# Patient Record
Sex: Male | Born: 1970 | Race: White | Hispanic: No | Marital: Married | Smoking: Former smoker
Health system: Southern US, Community
[De-identification: ages and names within clinical notes are randomized; demographics above are authoritative.]

## PROBLEM LIST (undated history)

## (undated) DIAGNOSIS — R519 Headache, unspecified: Secondary | ICD-10-CM

## (undated) DIAGNOSIS — R0600 Dyspnea, unspecified: Secondary | ICD-10-CM

## (undated) DIAGNOSIS — M549 Dorsalgia, unspecified: Secondary | ICD-10-CM

## (undated) DIAGNOSIS — I4891 Unspecified atrial fibrillation: Secondary | ICD-10-CM

## (undated) DIAGNOSIS — I209 Angina pectoris, unspecified: Secondary | ICD-10-CM

## (undated) DIAGNOSIS — T7840XA Allergy, unspecified, initial encounter: Secondary | ICD-10-CM

## (undated) DIAGNOSIS — K219 Gastro-esophageal reflux disease without esophagitis: Secondary | ICD-10-CM

## (undated) DIAGNOSIS — I2510 Atherosclerotic heart disease of native coronary artery without angina pectoris: Secondary | ICD-10-CM

## (undated) DIAGNOSIS — I509 Heart failure, unspecified: Secondary | ICD-10-CM

## (undated) DIAGNOSIS — E785 Hyperlipidemia, unspecified: Secondary | ICD-10-CM

## (undated) DIAGNOSIS — J189 Pneumonia, unspecified organism: Secondary | ICD-10-CM

## (undated) DIAGNOSIS — I10 Essential (primary) hypertension: Secondary | ICD-10-CM

## (undated) DIAGNOSIS — J449 Chronic obstructive pulmonary disease, unspecified: Secondary | ICD-10-CM

## (undated) DIAGNOSIS — F419 Anxiety disorder, unspecified: Secondary | ICD-10-CM

## (undated) DIAGNOSIS — M1990 Unspecified osteoarthritis, unspecified site: Secondary | ICD-10-CM

## (undated) DIAGNOSIS — I499 Cardiac arrhythmia, unspecified: Secondary | ICD-10-CM

## (undated) DIAGNOSIS — Z87442 Personal history of urinary calculi: Secondary | ICD-10-CM

## (undated) DIAGNOSIS — G4730 Sleep apnea, unspecified: Secondary | ICD-10-CM

## (undated) DIAGNOSIS — J984 Other disorders of lung: Secondary | ICD-10-CM

## (undated) DIAGNOSIS — I219 Acute myocardial infarction, unspecified: Secondary | ICD-10-CM

## (undated) DIAGNOSIS — J45909 Unspecified asthma, uncomplicated: Secondary | ICD-10-CM

## (undated) HISTORY — DX: Hyperlipidemia, unspecified: E78.5

## (undated) HISTORY — DX: Essential (primary) hypertension: I10

## (undated) HISTORY — PX: TONSILLECTOMY: SUR1361

## (undated) HISTORY — DX: Allergy, unspecified, initial encounter: T78.40XA

## (undated) HISTORY — PX: OTHER SURGICAL HISTORY: SHX169

## (undated) HISTORY — DX: Chronic obstructive pulmonary disease, unspecified: J44.9

## (undated) HISTORY — DX: Dorsalgia, unspecified: M54.9

## (undated) HISTORY — DX: Anxiety disorder, unspecified: F41.9

---

## 1994-12-07 HISTORY — PX: BACK SURGERY: SHX140

## 1997-12-07 HISTORY — PX: LUMBAR DISC SURGERY: SHX700

## 2002-12-07 HISTORY — PX: KNEE ARTHROSCOPY: SUR90

## 2005-03-21 ENCOUNTER — Inpatient Hospital Stay: Payer: Self-pay | Admitting: Internal Medicine

## 2005-03-23 ENCOUNTER — Other Ambulatory Visit: Payer: Self-pay

## 2005-12-09 ENCOUNTER — Emergency Department: Payer: Self-pay | Admitting: Emergency Medicine

## 2006-04-09 ENCOUNTER — Emergency Department: Payer: Self-pay | Admitting: Emergency Medicine

## 2011-04-06 ENCOUNTER — Inpatient Hospital Stay: Payer: Self-pay | Admitting: Internal Medicine

## 2011-04-07 HISTORY — PX: CATHETERIZATION OF PULMONARY ARTERY WITH RETRIEVAL OF FOREIGN BODY: SHX6517

## 2011-05-29 ENCOUNTER — Ambulatory Visit: Payer: Self-pay | Admitting: Internal Medicine

## 2011-06-02 ENCOUNTER — Ambulatory Visit: Payer: Self-pay | Admitting: Gastroenterology

## 2011-06-04 LAB — PATHOLOGY REPORT

## 2011-06-17 ENCOUNTER — Ambulatory Visit: Payer: Self-pay

## 2014-11-10 ENCOUNTER — Emergency Department: Payer: Self-pay | Admitting: Student

## 2015-06-28 ENCOUNTER — Telehealth: Payer: Self-pay | Admitting: Family Medicine

## 2015-06-28 MED ORDER — ALPRAZOLAM 1 MG PO TABS: 1.0000 mg | ORAL_TABLET | Freq: Four times a day (QID) | ORAL | Status: DC | PRN

## 2015-06-28 NOTE — Telephone Encounter (Signed)
Medication has been refilled and patient will come and pick up prescription 30-day supply was sent. Patient will need to schedule an appointment before next refill is due.

## 2015-06-28 NOTE — Telephone Encounter (Signed)
PT IS NEEDING REFILL ON HIS  ALPRAZOLAM  4 TIMES A DAY. PT IS OUT. PLEASE NEED THIS TODAY. GOES ON [employer] THIS WEEKEND.

## 2015-07-09 ENCOUNTER — Ambulatory Visit: Payer: Self-pay | Admitting: Family Medicine

## 2015-08-06 ENCOUNTER — Ambulatory Visit (INDEPENDENT_AMBULATORY_CARE_PROVIDER_SITE_OTHER): Payer: PRIVATE HEALTH INSURANCE | Admitting: Family Medicine

## 2015-08-06 ENCOUNTER — Encounter (INDEPENDENT_AMBULATORY_CARE_PROVIDER_SITE_OTHER): Payer: Self-pay

## 2015-08-06 ENCOUNTER — Encounter: Payer: Self-pay | Admitting: Family Medicine

## 2015-08-06 VITALS — BP 112/78 | HR 87 | Temp 98.0°F | Resp 18 | Ht 72.0 in | Wt 232.5 lb

## 2015-08-06 DIAGNOSIS — J45909 Unspecified asthma, uncomplicated: Secondary | ICD-10-CM | POA: Insufficient documentation

## 2015-08-06 DIAGNOSIS — I2510 Atherosclerotic heart disease of native coronary artery without angina pectoris: Secondary | ICD-10-CM

## 2015-08-06 DIAGNOSIS — J449 Chronic obstructive pulmonary disease, unspecified: Secondary | ICD-10-CM | POA: Insufficient documentation

## 2015-08-06 DIAGNOSIS — F419 Anxiety disorder, unspecified: Secondary | ICD-10-CM | POA: Diagnosis not present

## 2015-08-06 DIAGNOSIS — G4733 Obstructive sleep apnea (adult) (pediatric): Secondary | ICD-10-CM | POA: Insufficient documentation

## 2015-08-06 DIAGNOSIS — E785 Hyperlipidemia, unspecified: Secondary | ICD-10-CM | POA: Diagnosis not present

## 2015-08-06 DIAGNOSIS — I25118 Atherosclerotic heart disease of native coronary artery with other forms of angina pectoris: Secondary | ICD-10-CM | POA: Insufficient documentation

## 2015-08-06 MED ORDER — ATORVASTATIN CALCIUM 40 MG PO TABS: 40.0000 mg | ORAL_TABLET | Freq: Every day | ORAL | Status: DC

## 2015-08-06 MED ORDER — CLOPIDOGREL BISULFATE 75 MG PO TABS: 75.0000 mg | ORAL_TABLET | Freq: Every day | ORAL | Status: DC

## 2015-08-06 MED ORDER — ALPRAZOLAM 1 MG PO TABS: 1.0000 mg | ORAL_TABLET | Freq: Four times a day (QID) | ORAL | Status: DC | PRN

## 2015-08-06 NOTE — Progress Notes (Signed)
Name: Cory Espinoza   MRN: [id number]    DOB: 09-Jul-1971   Date:08/06/2015       Progress Note  Subjective  Chief Complaint  Chief Complaint  Patient presents with  . Medication Refill    All meds  . Anxiety  . Hypertension  . Hyperlipidemia    Anxiety Presents for follow-up visit. Symptoms include muscle tension. Patient reports no chest pain, nervous/anxious behavior, palpitations, panic or shortness of breath.   Past treatments include benzodiazephines. The treatment provided significant relief.  Hyperlipidemia This is a chronic problem. The problem is controlled. Recent lipid tests were reviewed and are normal. Pertinent negatives include no chest pain, focal weakness, leg pain, myalgias or shortness of breath. Current antihyperlipidemic treatment includes statins. Risk factors for coronary artery disease include dyslipidemia, male sex and stress.  Coronary Artery Disease Pt. Has history of  [name] s/p MI in 2006. He is on Plavix 75mg  daily. Has not seen a cardiologist in over 1 year. Recent episode of waking up with chest tightness, relieved with nitroglycerin. Patient admits to being under increased stress at that time.  Past Medical History  Diagnosis Date  . Allergy   . Anxiety   . Hyperlipidemia   . Hypertension   . COPD (chronic obstructive pulmonary disease)     Past Surgical History  Procedure Laterality Date  . Catheterization of pulmonary artery with retrieval of foreign body Bilateral 04/07/2011    heart  . Knee arthroscopy Bilateral 2004  . Lumbar disc surgery  1999  . Back surgery  1996    Family History  Problem Relation Age of Onset  . Heart disease Mother   . Heart disease Father   . Diabetes Sister   . Hypertension Brother   . Cancer Paternal Grandmother     breast  . Diabetes Paternal Grandmother     Social History   Social History  . Marital Status: Married    Spouse Name: N/A  . Number of Children: N/A  . Years of Education: N/A    Occupational History  . Not on file.   Social History Main Topics  . Smoking status: Never Smoker   . Smokeless tobacco: Never Used  . Alcohol Use: 0.0 oz/week    0 Standard drinks or equivalent per week     Comment: rarely  . Drug Use: No  . Sexual Activity: Not on file   Other Topics Concern  . Not on file   Social History Narrative  . No narrative on file     Current outpatient prescriptions:  .  ALPRAZolam (XANAX) 1 MG tablet, Take 1 tablet (1 mg total) by mouth 4 (four) times daily as needed for anxiety., Disp: 120 tablet, Rfl: 0 .  atorvastatin (LIPITOR) 40 MG tablet, Take by mouth., Disp: , Rfl:  .  B COMPLEX VITAMINS PO, Take by mouth., Disp: , Rfl:  .  budesonide-formoterol (SYMBICORT) 160-4.5 MCG/ACT inhaler, Inhale into the lungs., Disp: , Rfl:  .  clopidogrel (PLAVIX) 75 MG tablet, , Disp: , Rfl: 0 .  nitroGLYCERIN (NITROLINGUAL) 0.4 MG/SPRAY spray, , Disp: , Rfl:  .  Saw Palmetto, Serenoa repens, (SAW PALMETTO BERRY) 160 MG CAPS, Take by mouth., Disp: , Rfl:   Allergies  Allergen Reactions  . Amoxicillin   . Aspirin     swelling of throat  . Codeine Hives  . Hydrocodone-Acetaminophen     itching  . Oxycodone-Acetaminophen Nausea And Vomiting  . Penicillins     unknown  Review of Systems  Respiratory: Negative for shortness of breath.   Cardiovascular: Negative for chest pain and palpitations.  Musculoskeletal: Negative for myalgias.  Neurological: Negative for focal weakness.  Psychiatric/Behavioral: Negative for depression. The patient is not nervous/anxious.     Objective  Filed Vitals:   08/06/15 0822  BP: 112/78  Pulse: 87  Temp: 98 F (36.7 C)  TempSrc: Oral  Resp: 18  Height: 6' (1.829 m)  Weight: 232 lb 8 oz (105.461 kg)  SpO2: 96%    Physical Exam  Constitutional: He is oriented to person, place, and time and well-developed, well-nourished, and in no distress.  HENT:  Head: Normocephalic and atraumatic.  Cardiovascular:  Normal rate and regular rhythm.   Pulmonary/Chest: Effort normal and breath sounds normal.  Abdominal: Soft. Bowel sounds are normal.  Neurological: He is alert and oriented to person, place, and time.  Skin: Skin is warm and dry.  Psychiatric: Mood, memory, affect and judgment normal.  Nursing note and vitals reviewed.  Assessment & Plan  1. Hyperlipidemia  - Lipid Profile - Comprehensive Metabolic Panel (CMET) - atorvastatin (LIPITOR) 40 MG tablet; Take 1 tablet (40 mg total) by mouth daily at 6 PM.  Dispense: 90 tablet; Refill: 1  2. Anxiety  - ALPRAZolam (XANAX) 1 MG tablet; Take 1 tablet (1 mg total) by mouth 4 (four) times daily as needed for anxiety.  Dispense: 120 tablet; Refill: 2  3. Atherosclerosis of native coronary artery of native heart without angina pectoris Patient advised to follow-up with cardiology given his recent episode of chest tightness relieved with nitroglycerin. Prescription for Plavix provided.  - atorvastatin (LIPITOR) 40 MG tablet; Take 1 tablet (40 mg total) by mouth daily at 6 PM.  Dispense: 90 tablet; Refill: 1 - clopidogrel (PLAVIX) 75 MG tablet; Take 1 tablet (75 mg total) by mouth daily.  Dispense: 90 tablet; Refill: 1   Shaquitta Burbridge Asad A[facility] 08/06/2015 8:38 AM

## 2015-08-07 LAB — COMPREHENSIVE METABOLIC PANEL
ALT: 43 IU/L (ref 0–44)
AST: 23 IU/L (ref 0–40)
Albumin/Globulin Ratio: 2 (ref 1.1–2.5)
Albumin: 4.3 g/dL (ref 3.5–5.5)
Alkaline Phosphatase: 78 IU/L (ref 39–117)
BUN/Creatinine Ratio: 12 (ref 9–20)
BUN: 9 mg/dL (ref 6–24)
Bilirubin Total: 0.5 mg/dL (ref 0.0–1.2)
CO2: 24 mmol/L (ref 18–29)
Calcium: 9.4 mg/dL (ref 8.7–10.2)
Chloride: 103 mmol/L (ref 97–108)
Creatinine, Ser: 0.77 mg/dL (ref 0.76–1.27)
GFR calc Af Amer: 128 mL/min/{1.73_m2} (ref >59)
GFR calc non Af Amer: 111 mL/min/{1.73_m2} (ref >59)
Globulin, Total: 2.2 g/dL (ref 1.5–4.5)
Glucose: 92 mg/dL (ref 65–99)
Potassium: 4.8 mmol/L (ref 3.5–5.2)
Sodium: 141 mmol/L (ref 134–144)
Total Protein: 6.5 g/dL (ref 6.0–8.5)

## 2015-08-07 LAB — LIPID PANEL
Chol/HDL Ratio: 2.6 ratio units (ref 0.0–5.0)
Cholesterol, Total: 115 mg/dL (ref 100–199)
HDL: 45 mg/dL (ref >39)
LDL Calculated: 57 mg/dL (ref 0–99)
Triglycerides: 63 mg/dL (ref 0–149)
VLDL Cholesterol Cal: 13 mg/dL (ref 5–40)

## 2015-10-29 ENCOUNTER — Encounter: Payer: Self-pay | Admitting: Family Medicine

## 2015-10-29 ENCOUNTER — Ambulatory Visit (INDEPENDENT_AMBULATORY_CARE_PROVIDER_SITE_OTHER): Payer: PRIVATE HEALTH INSURANCE | Admitting: Family Medicine

## 2015-10-29 VITALS — BP 122/76 | HR 78 | Temp 97.8°F | Resp 18 | Ht 72.0 in | Wt 228.2 lb

## 2015-10-29 DIAGNOSIS — I2510 Atherosclerotic heart disease of native coronary artery without angina pectoris: Secondary | ICD-10-CM | POA: Diagnosis not present

## 2015-10-29 DIAGNOSIS — R6882 Decreased libido: Secondary | ICD-10-CM | POA: Diagnosis not present

## 2015-10-29 DIAGNOSIS — F419 Anxiety disorder, unspecified: Secondary | ICD-10-CM | POA: Diagnosis not present

## 2015-10-29 DIAGNOSIS — R7401 Elevation of levels of liver transaminase levels: Secondary | ICD-10-CM

## 2015-10-29 DIAGNOSIS — R740 Nonspecific elevation of levels of transaminase and lactic acid dehydrogenase [LDH]: Secondary | ICD-10-CM | POA: Diagnosis not present

## 2015-10-29 DIAGNOSIS — E785 Hyperlipidemia, unspecified: Secondary | ICD-10-CM | POA: Diagnosis not present

## 2015-10-29 MED ORDER — ALPRAZOLAM 1 MG PO TABS: 1.0000 mg | ORAL_TABLET | Freq: Four times a day (QID) | ORAL | Status: DC | PRN

## 2015-10-29 NOTE — Progress Notes (Signed)
Name: Cory Espinoza   MRN: [id number]    DOB: 11-14-1971   Date:10/29/2015       Progress Note  Subjective  Chief Complaint  Chief Complaint  Patient presents with  . Medication Refill    3 month F/U  . COPD    Well controlled  . Hyperlipidemia    Takes potassium pills to help with the cramps and has improved symptoms since.  . Anxiety    Hyperlipidemia This is a chronic problem. The problem is controlled. Pertinent negatives include no chest pain, leg pain or myalgias. Current antihyperlipidemic treatment includes statins.  Anxiety Presents for follow-up visit. Symptoms include insomnia (sleep schedule is different every 2 weeks.). Patient reports no chest pain, excessive worry, malaise, nervous/anxious behavior, palpitations or panic.   Past treatments include benzodiazephines. The treatment provided significant relief. Compliance with prior treatments has been good.  Decrease in Libido Pt. Describes decreased sex drive, rarely interested in sexual activity. Has history of low Testosterone in the past.   Past Medical History  Diagnosis Date  . Allergy   . Anxiety   . Hyperlipidemia   . Hypertension   . COPD (chronic obstructive pulmonary disease) [facility])     Past Surgical History  Procedure Laterality Date  . Catheterization of pulmonary artery with retrieval of foreign body Bilateral 04/07/2011    heart  . Knee arthroscopy Bilateral 2004  . Lumbar disc surgery  1999  . Back surgery  1996    Family History  Problem Relation Age of Onset  . Heart disease Mother   . Heart disease Father   . Diabetes Sister   . Hypertension Brother   . Cancer Paternal Grandmother     breast  . Diabetes Paternal Grandmother     Social History   Social History  . Marital Status: Married    Spouse Name: N/A  . Number of Children: N/A  . Years of Education: N/A   Occupational History  . Not on file.   Social History Main Topics  . Smoking status: Former Smoker -- 2.00  packs/day for 23 years    Types: Cigarettes    Start date: 10/28/1989    Quit date: 10/28/2012  . Smokeless tobacco: Current User    Types: Snuff  . Alcohol Use: 0.0 oz/week    0 Standard drinks or equivalent per week     Comment: rarely  . Drug Use: No  . Sexual Activity:    Partners: Female   Other Topics Concern  . Not on file   Social History Narrative     Current outpatient prescriptions:  .  ALPRAZolam (XANAX) 1 MG tablet, Take 1 tablet (1 mg total) by mouth 4 (four) times daily as needed for anxiety., Disp: 120 tablet, Rfl: 2 .  atorvastatin (LIPITOR) 40 MG tablet, Take 1 tablet (40 mg total) by mouth daily at 6 PM., Disp: 90 tablet, Rfl: 1 .  B COMPLEX VITAMINS PO, Take by mouth., Disp: , Rfl:  .  budesonide-formoterol (SYMBICORT) 160-4.5 MCG/ACT inhaler, Inhale into the lungs., Disp: , Rfl:  .  clindamycin (CLEOCIN) 150 MG capsule, , Disp: , Rfl: 0 .  clopidogrel (PLAVIX) 75 MG tablet, Take 1 tablet (75 mg total) by mouth daily., Disp: 90 tablet, Rfl: 1 .  nitroGLYCERIN (NITROLINGUAL) 0.4 MG/SPRAY spray, , Disp: , Rfl:  .  Saw Palmetto, Serenoa repens, (SAW PALMETTO BERRY) 160 MG CAPS, Take by mouth., Disp: , Rfl:   Allergies  Allergen Reactions  . Amoxicillin   .  Aspirin     swelling of throat  . Codeine Hives  . Hydrocodone-Acetaminophen     itching  . Oxycodone-Acetaminophen Nausea And Vomiting  . Penicillins     unknown    Review of Systems  Cardiovascular: Negative for chest pain and palpitations.  Musculoskeletal: Negative for myalgias.  Psychiatric/Behavioral: The patient has insomnia (sleep schedule is different every 2 weeks.). The patient is not nervous/anxious.    Objective  Filed Vitals:   10/29/15 0816  BP: 122/76  Pulse: 78  Temp: 97.8 F (36.6 C)  TempSrc: Oral  Resp: 18  Height: 6' (1.829 m)  Weight: 228 lb 3.2 oz (103.511 kg)  SpO2: 95%    Physical Exam  Constitutional: He is oriented to person, place, and time and  well-developed, well-nourished, and in no distress.  HENT:  Head: Normocephalic and atraumatic.  Cardiovascular: Normal rate and regular rhythm.   Pulmonary/Chest: Effort normal and breath sounds normal.  Abdominal: Soft. Bowel sounds are normal.  Neurological: He is alert and oriented to person, place, and time.  Skin: Skin is warm and dry.  Psychiatric: Mood, memory, affect and judgment normal.  Nursing note and vitals reviewed.   Assessment & Plan  1. Loss of libido Likely from stress and anxiety versus lack of sleep versus decreased production. Recheck levels and follow-up. - Testosterone  2. Atherosclerosis of native coronary artery of native heart without angina pectoris Follows up with cardiology. Continue on Plavix, sublingual nitroglycerin, and statin therapy.  3. Anxiety Stable on present dosage of alprazolam. Refills provided - ALPRAZolam (XANAX) 1 MG tablet; Take 1 tablet (1 mg total) by mouth 4 (four) times daily as needed for anxiety.  Dispense: 120 tablet; Refill: 2  4. Hyperlipidemia FLP at goal. We'll obtain liver enzymes and follow-up. - Comprehensive Metabolic Panel (CMET)   Samyiah Halvorsen Asad A. [facility]lene [facility] 10/29/2015 8:36 AM

## 2015-10-30 DIAGNOSIS — R7401 Elevation of levels of liver transaminase levels: Secondary | ICD-10-CM | POA: Insufficient documentation

## 2015-10-30 DIAGNOSIS — R740 Nonspecific elevation of levels of transaminase and lactic acid dehydrogenase [LDH]: Secondary | ICD-10-CM

## 2015-10-30 DIAGNOSIS — R7402 Elevation of levels of lactic acid dehydrogenase (LDH): Secondary | ICD-10-CM | POA: Insufficient documentation

## 2015-10-30 LAB — COMPREHENSIVE METABOLIC PANEL
ALT: 50 IU/L — ABNORMAL HIGH (ref 0–44)
AST: 28 IU/L (ref 0–40)
Albumin/Globulin Ratio: 2 (ref 1.1–2.5)
Albumin: 4.5 g/dL (ref 3.5–5.5)
Alkaline Phosphatase: 101 IU/L (ref 39–117)
BUN/Creatinine Ratio: 15 (ref 9–20)
BUN: 13 mg/dL (ref 6–24)
Bilirubin Total: 0.6 mg/dL (ref 0.0–1.2)
CO2: 24 mmol/L (ref 18–29)
Calcium: 9.5 mg/dL (ref 8.7–10.2)
Chloride: 103 mmol/L (ref 97–106)
Creatinine, Ser: 0.86 mg/dL (ref 0.76–1.27)
GFR calc Af Amer: 123 mL/min/{1.73_m2} (ref >59)
GFR calc non Af Amer: 106 mL/min/{1.73_m2} (ref >59)
Globulin, Total: 2.3 g/dL (ref 1.5–4.5)
Glucose: 96 mg/dL (ref 65–99)
Potassium: 4.5 mmol/L (ref 3.5–5.2)
Sodium: 141 mmol/L (ref 136–144)
Total Protein: 6.8 g/dL (ref 6.0–8.5)

## 2015-10-30 LAB — TESTOSTERONE: Testosterone: 435 ng/dL (ref 348–1197)

## 2015-10-30 NOTE — Addendum Note (Signed)
Addended bySherryll Burger: Letrell Attwood A A on: 10/30/2015 06:46 PM   Modules accepted: Orders

## 2015-11-28 ENCOUNTER — Ambulatory Visit: Payer: Self-pay | Admitting: Family

## 2015-11-28 ENCOUNTER — Encounter: Payer: Self-pay | Admitting: Family

## 2015-11-28 VITALS — BP 120/80 | HR 112 | Temp 98.7°F

## 2015-11-28 DIAGNOSIS — J329 Chronic sinusitis, unspecified: Secondary | ICD-10-CM

## 2015-11-28 MED ORDER — AZITHROMYCIN 250 MG PO TABS: ORAL_TABLET | ORAL | Status: DC

## 2015-11-28 NOTE — Progress Notes (Signed)
S/ congestion and cold sxs x 5 days, cough denies cp or sob Chilled at times   O/ ENT tms dull, retracted nasal turbinates boggy and inflamed , R max tenderness pharynx increased pnd, neck supple without node heart rsr lungs clear  A/ sinusitis R max  P /Zpack 250 mg supportive measures discussed.  F/u prn not improving.

## 2016-01-29 ENCOUNTER — Encounter: Payer: Self-pay | Admitting: Family Medicine

## 2016-01-29 ENCOUNTER — Ambulatory Visit (INDEPENDENT_AMBULATORY_CARE_PROVIDER_SITE_OTHER): Payer: PRIVATE HEALTH INSURANCE | Admitting: Family Medicine

## 2016-01-29 VITALS — BP 122/75 | HR 91 | Temp 98.5°F | Resp 18 | Ht 72.0 in | Wt 235.3 lb

## 2016-01-29 DIAGNOSIS — E669 Obesity, unspecified: Secondary | ICD-10-CM

## 2016-01-29 DIAGNOSIS — R740 Nonspecific elevation of levels of transaminase and lactic acid dehydrogenase [LDH]: Secondary | ICD-10-CM

## 2016-01-29 DIAGNOSIS — F419 Anxiety disorder, unspecified: Secondary | ICD-10-CM

## 2016-01-29 DIAGNOSIS — R7401 Elevation of levels of liver transaminase levels: Secondary | ICD-10-CM

## 2016-01-29 DIAGNOSIS — E785 Hyperlipidemia, unspecified: Secondary | ICD-10-CM | POA: Diagnosis not present

## 2016-01-29 DIAGNOSIS — I2510 Atherosclerotic heart disease of native coronary artery without angina pectoris: Secondary | ICD-10-CM

## 2016-01-29 MED ORDER — LORCASERIN HCL 10 MG PO TABS: 1.0000 | ORAL_TABLET | Freq: Two times a day (BID) | ORAL | Status: DC

## 2016-01-29 MED ORDER — CLOPIDOGREL BISULFATE 75 MG PO TABS: 75.0000 mg | ORAL_TABLET | Freq: Every day | ORAL | Status: DC

## 2016-01-29 MED ORDER — ATORVASTATIN CALCIUM 40 MG PO TABS: 40.0000 mg | ORAL_TABLET | Freq: Every day | ORAL | Status: DC

## 2016-01-29 MED ORDER — ALPRAZOLAM 1 MG PO TABS: 1.0000 mg | ORAL_TABLET | Freq: Four times a day (QID) | ORAL | Status: DC | PRN

## 2016-01-29 NOTE — Progress Notes (Signed)
Name: Cory Espinoza   MRN: [id number]    DOB: [birth year]   Date:01/29/2016       Progress Note  Subjective  Chief Complaint  Chief Complaint  Patient presents with  . Follow-up    3 mo  . Hyperlipidemia  . Anxiety    Hyperlipidemia This is a chronic problem. The problem is controlled. Pertinent negatives include no chest pain, leg pain, myalgias or shortness of breath. Current antihyperlipidemic treatment includes statins.  Anxiety Presents for follow-up visit. The problem has been unchanged. Symptoms include insomnia (mainly 2/2 shift work). Patient reports no chest pain, excessive worry, nervous/anxious behavior or shortness of breath.   Past treatments include benzodiazephines. The treatment provided significant relief. Compliance with prior treatments has been good.  Obesity Pt. Presents to discuss obesity and weight gain. Current weight is 232 lb, BMI of 31.5 kg/m2. He reports adherence to a balanced diet (protein shake for breakfast, tuna fish and vegetables for lunch and protein bar). He eats out occasionally (usually chicken sandwich w/o Mayonnaise). He is physically active (walks 1-2 miles a day) and has a Systems analyst and a nutrition specialist.  He is very frustrated with his inability to lose weight and worries about obesity as a risk factor for heart disease.   Past Medical History  Diagnosis Date  . Allergy   . Anxiety   . Hyperlipidemia   . Hypertension   . COPD (chronic obstructive pulmonary disease) [facility])     Past Surgical History  Procedure Laterality Date  . Catheterization of pulmonary artery with retrieval of foreign body Bilateral 04/07/2011    heart  . Knee arthroscopy Bilateral 2004  . Lumbar disc surgery  1999  . Back surgery  1996    Family History  Problem Relation Age of Onset  . Heart disease Mother   . Heart disease Father   . Diabetes Sister   . Hypertension Brother   . Cancer Paternal Grandmother     breast  . Diabetes Paternal  Grandmother     Social History   Social History  . Marital Status: Married    Spouse Name: N/A  . Number of Children: N/A  . Years of Education: N/A   Occupational History  . Not on file.   Social History Main Topics  . Smoking status: Former Smoker -- 2.00 packs/day for 23 years    Types: Cigarettes    Start date: 10/28/1989    Quit date: 10/28/2012  . Smokeless tobacco: Current User    Types: Snuff  . Alcohol Use: 0.0 oz/week    0 Standard drinks or equivalent per week     Comment: rarely  . Drug Use: No  . Sexual Activity:    Partners: Female   Other Topics Concern  . Not on file   Social History Narrative     Current outpatient prescriptions:  .  ALPRAZolam (XANAX) 1 MG tablet, Take 1 tablet (1 mg total) by mouth 4 (four) times daily as needed for anxiety., Disp: 120 tablet, Rfl: 2 .  atorvastatin (LIPITOR) 40 MG tablet, Take 1 tablet (40 mg total) by mouth daily at 6 PM., Disp: 90 tablet, Rfl: 1 .  B COMPLEX VITAMINS PO, Take by mouth., Disp: , Rfl:  .  budesonide-formoterol (SYMBICORT) 160-4.5 MCG/ACT inhaler, Inhale into the lungs., Disp: , Rfl:  .  clopidogrel (PLAVIX) 75 MG tablet, Take 1 tablet (75 mg total) by mouth daily., Disp: 90 tablet, Rfl: 1 .  nitroGLYCERIN (NITROLINGUAL) 0.4 MG/SPRAY  spray, , Disp: , Rfl:  .  Saw Palmetto, Serenoa repens, (SAW PALMETTO BERRY) 160 MG CAPS, Take by mouth., Disp: , Rfl:   Allergies  Allergen Reactions  . Amoxicillin   . Aspirin     swelling of throat  . Codeine Hives  . Hydrocodone-Acetaminophen     itching  . Oxycodone-Acetaminophen Nausea And Vomiting  . Penicillins     unknown     Review of Systems  Constitutional: Negative for fever, chills and weight loss.  Respiratory: Negative for shortness of breath.   Cardiovascular: Negative for chest pain.  Musculoskeletal: Negative for myalgias.  Psychiatric/Behavioral: Negative for depression. The patient has insomnia (mainly 2/2 shift work). The patient is  not nervous/anxious.     Objective  Filed Vitals:   01/29/16 0810  BP: 122/75  Pulse: 91  Temp: 98.5 F (36.9 C)  TempSrc: Oral  Resp: 18  Height: 6' (1.829 m)  Weight: 235 lb 4.8 oz (106.731 kg)  SpO2: 96%    Physical Exam  Constitutional: He is oriented to person, place, and time and well-developed, well-nourished, and in no distress.  HENT:  Head: Normocephalic and atraumatic.  Cardiovascular: Normal rate and regular rhythm.   Pulmonary/Chest: Effort normal and breath sounds normal.  Neurological: He is alert and oriented to person, place, and time.  Psychiatric: Mood, memory, affect and judgment normal.  Nursing note and vitals reviewed.   Assessment & Plan  1. Elevated ALT measurement  - Comprehensive Metabolic Panel (CMET)  2. Anxiety  stable on alprazolam taken up to 4 times daily as needed. Patient aware of dependence potential of benzodiazepines. - ALPRAZolam (XANAX) 1 MG tablet; Take 1 tablet (1 mg total) by mouth 4 (four) times daily as needed for anxiety.  Dispense: 120 tablet; Refill: 2  3. Hyperlipidemia  - Lipid Profile - atorvastatin (LIPITOR) 40 MG tablet; Take 1 tablet (40 mg total) by mouth daily at 6 PM.  Dispense: 90 tablet; Refill: 1  4. Obesity (BMI 30-39.9) We will start patient on Lorcaserin 10 mg twice daily for achieving weight loss along with diet lifestyle measures. Patient educated in detail about potential adverse effects.follow-up in one month. - Lorcaserin HCl (BELVIQ) 10 MG TABS; Take 1 tablet by m  5. Atherosclerosis of native coronary artery of native heart without angina pectoris Advised to follow up with cardiology. - atorvastatin (LIPITOR) 40 MG tablet; Take 1 tablet (40 mg total) by mouth daily at 6 PM.  Dispense: 90 tablet; Refill: 1 - clopidogrel (PLAVIX) 75 MG tablet; Take 1 tablet (75 mg total) by mouth daily.  Dispense: 90 tablet; Refill: 1   Jeilyn Reznik Asad A. [facility] 01/29/2016 8:35 AM

## 2016-01-30 ENCOUNTER — Telehealth: Payer: Self-pay | Admitting: Family Medicine

## 2016-01-30 ENCOUNTER — Other Ambulatory Visit: Payer: Self-pay

## 2016-01-30 DIAGNOSIS — E875 Hyperkalemia: Secondary | ICD-10-CM

## 2016-01-30 DIAGNOSIS — E870 Hyperosmolality and hypernatremia: Secondary | ICD-10-CM

## 2016-01-30 LAB — COMPREHENSIVE METABOLIC PANEL
ALT: 35 IU/L (ref 0–44)
AST: 26 IU/L (ref 0–40)
Albumin/Globulin Ratio: 1.8 (ref 1.1–2.5)
Albumin: 4.6 g/dL (ref 3.5–5.5)
Alkaline Phosphatase: 89 IU/L (ref 39–117)
BUN/Creatinine Ratio: 10 (ref 9–20)
BUN: 10 mg/dL (ref 6–24)
Bilirubin Total: 0.8 mg/dL (ref 0.0–1.2)
CO2: 22 mmol/L (ref 18–29)
Calcium: 9.8 mg/dL (ref 8.7–10.2)
Chloride: 104 mmol/L (ref 96–106)
Creatinine, Ser: 1.04 mg/dL (ref 0.76–1.27)
GFR calc Af Amer: 101 mL/min/{1.73_m2} (ref >59)
GFR calc non Af Amer: 88 mL/min/{1.73_m2} (ref >59)
Globulin, Total: 2.5 g/dL (ref 1.5–4.5)
Glucose: 104 mg/dL — ABNORMAL HIGH (ref 65–99)
Potassium: 5.6 mmol/L — ABNORMAL HIGH (ref 3.5–5.2)
Sodium: 146 mmol/L — ABNORMAL HIGH (ref 134–144)
Total Protein: 7.1 g/dL (ref 6.0–8.5)

## 2016-01-30 LAB — LIPID PANEL
Chol/HDL Ratio: 6.1 ratio units — ABNORMAL HIGH (ref 0.0–5.0)
Cholesterol, Total: 258 mg/dL — ABNORMAL HIGH (ref 100–199)
HDL: 42 mg/dL (ref >39)
LDL Calculated: 196 mg/dL — ABNORMAL HIGH (ref 0–99)
Triglycerides: 102 mg/dL (ref 0–149)
VLDL Cholesterol Cal: 20 mg/dL (ref 5–40)

## 2016-01-30 NOTE — Telephone Encounter (Signed)
Pt said you asked him to call you back. He returned your call. Please call

## 2016-02-04 NOTE — Telephone Encounter (Signed)
Returned call no answer left voicemail for patient concerning lab results

## 2016-02-12 ENCOUNTER — Telehealth: Payer: Self-pay | Admitting: Family Medicine

## 2016-02-12 NOTE — Telephone Encounter (Signed)
Pt would like a call back. Pt states our office has been trying to get in touch with him.

## 2016-02-12 NOTE — Telephone Encounter (Signed)
Call has been returned to patient and his labs has been reviewed

## 2016-02-19 LAB — COMPREHENSIVE METABOLIC PANEL
ALT: 26 IU/L (ref 0–44)
AST: 22 IU/L (ref 0–40)
Albumin/Globulin Ratio: 2 (ref 1.2–2.2)
Albumin: 4.6 g/dL (ref 3.5–5.5)
Alkaline Phosphatase: 85 IU/L (ref 39–117)
BUN/Creatinine Ratio: 17 (ref 9–20)
BUN: 16 mg/dL (ref 6–24)
Bilirubin Total: 0.5 mg/dL (ref 0.0–1.2)
CO2: 23 mmol/L (ref 18–29)
Calcium: 9.4 mg/dL (ref 8.7–10.2)
Chloride: 101 mmol/L (ref 96–106)
Creatinine, Ser: 0.94 mg/dL (ref 0.76–1.27)
GFR calc Af Amer: 114 mL/min/{1.73_m2} (ref >59)
GFR calc non Af Amer: 99 mL/min/{1.73_m2} (ref >59)
Globulin, Total: 2.3 g/dL (ref 1.5–4.5)
Glucose: 90 mg/dL (ref 65–99)
Potassium: 5 mmol/L (ref 3.5–5.2)
Sodium: 141 mmol/L (ref 134–144)
Total Protein: 6.9 g/dL (ref 6.0–8.5)

## 2016-02-27 ENCOUNTER — Ambulatory Visit (INDEPENDENT_AMBULATORY_CARE_PROVIDER_SITE_OTHER): Payer: PRIVATE HEALTH INSURANCE | Admitting: Family Medicine

## 2016-02-27 ENCOUNTER — Encounter: Payer: Self-pay | Admitting: Family Medicine

## 2016-02-27 VITALS — BP 118/62 | HR 94 | Temp 98.2°F | Resp 16 | Ht 72.0 in | Wt 229.0 lb

## 2016-02-27 DIAGNOSIS — E669 Obesity, unspecified: Secondary | ICD-10-CM | POA: Diagnosis not present

## 2016-02-27 MED ORDER — LORCASERIN HCL 10 MG PO TABS: 1.0000 | ORAL_TABLET | Freq: Two times a day (BID) | ORAL | Status: DC

## 2016-02-27 NOTE — Progress Notes (Signed)
Name: Cory Espinoza   MRN: [id number]    DOB: [birth year]   Date:02/27/2016       Progress Note  Subjective  Chief Complaint  Chief Complaint  Patient presents with  . Medication Refill  . Follow-up  . Anxiety  . Obesity    is on belviq has lost 6 pds since last visit.    HPI  Obesity: Pt. Presents for follow up of weight-loss medication. He was started on Belviq 10 mg twice dailyFor weeks ago Since starting the medication, he has lost 6 lbs (235lb to 229) over 4 weeks.  He reports no side effects from the medication.    Past Medical History  Diagnosis Date  . Allergy   . Anxiety   . Hyperlipidemia   . Hypertension   . COPD (chronic obstructive pulmonary disease) [facility])     Past Surgical History  Procedure Laterality Date  . Catheterization of pulmonary artery with retrieval of foreign body Bilateral 04/07/2011    heart  . Knee arthroscopy Bilateral 2004  . Lumbar disc surgery  1999  . Back surgery  1996    Family History  Problem Relation Age of Onset  . Heart disease Mother   . Heart disease Father   . Diabetes Sister   . Hypertension Brother   . Cancer Paternal Grandmother     breast  . Diabetes Paternal Grandmother     Social History   Social History  . Marital Status: Married    Spouse Name: N/A  . Number of Children: N/A  . Years of Education: N/A   Occupational History  . Not on file.   Social History Main Topics  . Smoking status: Former Smoker -- 2.00 packs/day for 23 years    Types: Cigarettes    Start date: 10/28/1989    Quit date: 10/28/2012  . Smokeless tobacco: Current User    Types: Snuff  . Alcohol Use: 0.0 oz/week    0 Standard drinks or equivalent per week     Comment: rarely  . Drug Use: No  . Sexual Activity:    Partners: Female   Other Topics Concern  . Not on file   Social History Narrative     Current outpatient prescriptions:  .  ALPRAZolam (XANAX) 1 MG tablet, Take 1 tablet (1 mg total) by mouth 4 (four) times  daily as needed for anxiety., Disp: 120 tablet, Rfl: 2 .  atorvastatin (LIPITOR) 40 MG tablet, Take 1 tablet (40 mg total) by mouth daily at 6 PM., Disp: 90 tablet, Rfl: 1 .  B COMPLEX VITAMINS PO, Take by mouth., Disp: , Rfl:  .  budesonide-formoterol (SYMBICORT) 160-4.5 MCG/ACT inhaler, Inhale into the lungs., Disp: , Rfl:  .  clopidogrel (PLAVIX) 75 MG tablet, Take 1 tablet (75 mg total) by mouth daily., Disp: 90 tablet, Rfl: 1 .  Lorcaserin HCl (BELVIQ) 10 MG TABS, Take 1 tablet by mouth 2 (two) times daily., Disp: 60 tablet, Rfl: 0 .  nitroGLYCERIN (NITROLINGUAL) 0.4 MG/SPRAY spray, , Disp: , Rfl:  .  Saw Palmetto, Serenoa repens, (SAW PALMETTO BERRY) 160 MG CAPS, Take by mouth., Disp: , Rfl:   Allergies  Allergen Reactions  . Amoxicillin   . Aspirin     swelling of throat  . Codeine Hives  . Hydrocodone-Acetaminophen     itching  . Oxycodone-Acetaminophen Nausea And Vomiting  . Penicillins     unknown     Review of Systems  Constitutional: Positive for weight loss. Negative  for malaise/fatigue.  Cardiovascular: Negative for chest pain.  Gastrointestinal: Negative for abdominal pain.  Psychiatric/Behavioral: The patient is nervous/anxious.       Objective  There were no vitals filed for this visit.  Physical Exam  Constitutional: He is oriented to person, place, and time and well-developed, well-nourished, and in no distress.  Cardiovascular: Normal rate and regular rhythm.   Pulmonary/Chest: Effort normal and breath sounds normal.  Abdominal: Soft. Bowel sounds are normal.  Neurological: He is alert and oriented to person, place, and time.  Nursing note and vitals reviewed.       Assessment & Plan  1. Obesity (BMI 30-39.9) Continue on Belviq 10 mg twice daily for another 2 months. Advised to stay hydrated, continue with dietary and lifestyle therapy along with medication. Recheck weight in 2 months.  - Lorcaserin HCl (BELVIQ) 10 MG TABS; Take 1 tablet by  mouth 2 (two) times daily.  Dispense: 60 tablet; Refill: 2   Kelsie Zaborowski Asad A. [facility]lene [facility] 02/27/2016 8:33 AM

## 2016-04-24 DIAGNOSIS — R0789 Other chest pain: Secondary | ICD-10-CM | POA: Diagnosis not present

## 2016-04-24 DIAGNOSIS — R079 Chest pain, unspecified: Secondary | ICD-10-CM | POA: Insufficient documentation

## 2016-04-24 DIAGNOSIS — Z01818 Encounter for other preprocedural examination: Secondary | ICD-10-CM | POA: Diagnosis not present

## 2016-04-24 DIAGNOSIS — I208 Other forms of angina pectoris: Secondary | ICD-10-CM | POA: Diagnosis not present

## 2016-04-27 ENCOUNTER — Encounter
Admission: RE | Disposition: A | Discharge status: Home / Self Care | Payer: Self-pay | Source: Ambulatory Visit | Attending: Internal Medicine

## 2016-04-27 ENCOUNTER — Encounter: Payer: Self-pay | Admitting: *Deleted

## 2016-04-27 ENCOUNTER — Ambulatory Visit
Admission: RE | Admit: 2016-04-27 | Discharge: 2016-04-27 | Disposition: A | Discharge status: Home / Self Care | Payer: BLUE CROSS/BLUE SHIELD | Source: Ambulatory Visit | Attending: Internal Medicine | Admitting: Internal Medicine

## 2016-04-27 DIAGNOSIS — Z880 Allergy status to penicillin: Secondary | ICD-10-CM | POA: Insufficient documentation

## 2016-04-27 DIAGNOSIS — I2510 Atherosclerotic heart disease of native coronary artery without angina pectoris: Secondary | ICD-10-CM | POA: Diagnosis not present

## 2016-04-27 DIAGNOSIS — F17210 Nicotine dependence, cigarettes, uncomplicated: Secondary | ICD-10-CM | POA: Insufficient documentation

## 2016-04-27 DIAGNOSIS — Z8249 Family history of ischemic heart disease and other diseases of the circulatory system: Secondary | ICD-10-CM | POA: Diagnosis not present

## 2016-04-27 DIAGNOSIS — I25119 Atherosclerotic heart disease of native coronary artery with unspecified angina pectoris: Secondary | ICD-10-CM | POA: Insufficient documentation

## 2016-04-27 DIAGNOSIS — Z886 Allergy status to analgesic agent status: Secondary | ICD-10-CM | POA: Diagnosis not present

## 2016-04-27 DIAGNOSIS — I200 Unstable angina: Secondary | ICD-10-CM

## 2016-04-27 DIAGNOSIS — Z79899 Other long term (current) drug therapy: Secondary | ICD-10-CM | POA: Diagnosis not present

## 2016-04-27 DIAGNOSIS — Z885 Allergy status to narcotic agent status: Secondary | ICD-10-CM | POA: Insufficient documentation

## 2016-04-27 DIAGNOSIS — I209 Angina pectoris, unspecified: Secondary | ICD-10-CM | POA: Diagnosis not present

## 2016-04-27 DIAGNOSIS — I25110 Atherosclerotic heart disease of native coronary artery with unstable angina pectoris: Secondary | ICD-10-CM | POA: Diagnosis not present

## 2016-04-27 DIAGNOSIS — Z881 Allergy status to other antibiotic agents status: Secondary | ICD-10-CM | POA: Insufficient documentation

## 2016-04-27 DIAGNOSIS — F17290 Nicotine dependence, other tobacco product, uncomplicated: Secondary | ICD-10-CM | POA: Insufficient documentation

## 2016-04-27 DIAGNOSIS — E785 Hyperlipidemia, unspecified: Secondary | ICD-10-CM | POA: Diagnosis not present

## 2016-04-27 DIAGNOSIS — R079 Chest pain, unspecified: Secondary | ICD-10-CM | POA: Diagnosis present

## 2016-04-27 HISTORY — DX: Atherosclerotic heart disease of native coronary artery without angina pectoris: I25.10

## 2016-04-27 HISTORY — PX: CARDIAC CATHETERIZATION: SHX172

## 2016-04-27 SURGERY — LEFT HEART CATH AND CORONARY ANGIOGRAPHY
Anesthesia: Moderate Sedation

## 2016-04-27 MED ORDER — SODIUM CHLORIDE 0.9% FLUSH: 3.0000 mL | Freq: Two times a day (BID) | INTRAVENOUS | Status: DC

## 2016-04-27 MED ORDER — ADENOSINE (DIAGNOSTIC) 140MCG/KG/MIN
INTRAVENOUS | Status: DC | PRN
  Administered 2016-04-27: 140 ug/kg/min via INTRAVENOUS

## 2016-04-27 MED ORDER — FENTANYL CITRATE (PF) 100 MCG/2ML IJ SOLN
INTRAMUSCULAR | Status: DC | PRN
  Administered 2016-04-27 (×2): 25 ug via INTRAVENOUS

## 2016-04-27 MED ORDER — MIDAZOLAM HCL 2 MG/2ML IJ SOLN
INTRAMUSCULAR | Status: DC | PRN
  Administered 2016-04-27: 1 mg via INTRAVENOUS

## 2016-04-27 MED ORDER — SODIUM CHLORIDE 0.9% FLUSH: 3.0000 mL | INTRAVENOUS | Status: DC | PRN

## 2016-04-27 MED ORDER — CLOPIDOGREL BISULFATE 75 MG PO TABS
ORAL_TABLET | ORAL | Status: AC
  Filled 2016-04-27: qty 8

## 2016-04-27 MED ORDER — ACETAMINOPHEN 325 MG PO TABS: 650.0000 mg | ORAL_TABLET | ORAL | Status: DC | PRN

## 2016-04-27 MED ORDER — MIDAZOLAM HCL 2 MG/2ML IJ SOLN
INTRAMUSCULAR | Status: AC
  Filled 2016-04-27: qty 2

## 2016-04-27 MED ORDER — FENTANYL CITRATE (PF) 100 MCG/2ML IJ SOLN
INTRAMUSCULAR | Status: AC
  Filled 2016-04-27: qty 2

## 2016-04-27 MED ORDER — NITROGLYCERIN 5 MG/ML IV SOLN
INTRAVENOUS | Status: AC
  Filled 2016-04-27: qty 10

## 2016-04-27 MED ORDER — BIVALIRUDIN 250 MG IV SOLR
INTRAVENOUS | Status: AC
  Filled 2016-04-27: qty 250

## 2016-04-27 MED ORDER — IOPAMIDOL (ISOVUE-300) INJECTION 61%
INTRAVENOUS | Status: DC | PRN
  Administered 2016-04-27: 190 mL via INTRA_ARTERIAL

## 2016-04-27 MED ORDER — SODIUM CHLORIDE 0.9 % IV SOLN
250.0000 mg | INTRAVENOUS | Status: DC | PRN
  Administered 2016-04-27: 1.75 mg/kg/h via INTRAVENOUS

## 2016-04-27 MED ORDER — SODIUM CHLORIDE 0.9 % WEIGHT BASED INFUSION: 3.0000 mL/kg/h | INTRAVENOUS | Status: DC

## 2016-04-27 MED ORDER — SODIUM CHLORIDE 0.9 % IV SOLN
INTRAVENOUS | Status: DC
  Administered 2016-04-27 (×2): via INTRAVENOUS

## 2016-04-27 MED ORDER — SODIUM CHLORIDE 0.9 % IV SOLN: 250.0000 mL | INTRAVENOUS | Status: DC | PRN

## 2016-04-27 MED ORDER — ADENOSINE (DIAGNOSTIC) 3 MG/ML IV SOLN
INTRAVENOUS | Status: AC
  Filled 2016-04-27: qty 30

## 2016-04-27 MED ORDER — ONDANSETRON HCL 4 MG/2ML IJ SOLN: 4.0000 mg | Freq: Four times a day (QID) | INTRAMUSCULAR | Status: DC | PRN

## 2016-04-27 MED ORDER — HEPARIN (PORCINE) IN NACL 2-0.9 UNIT/ML-% IJ SOLN
INTRAMUSCULAR | Status: AC
  Filled 2016-04-27: qty 500

## 2016-04-27 SURGICAL SUPPLY — 14 items
CATH INFINITI 5FR ANG PIGTAIL (CATHETERS) ×3 IMPLANT
CATH INFINITI 5FR JL4 (CATHETERS) ×1 IMPLANT
CATH INFINITI JR4 5F (CATHETERS) ×3 IMPLANT
CATH VISTA GUIDE 6FR XB3.5 (CATHETERS) ×2 IMPLANT
DEVICE CLOSURE MYNXGRIP 6/7F (Vascular Products) ×1 IMPLANT
DEVICE INFLAT 30 PLUS (MISCELLANEOUS) ×1 IMPLANT
KIT MANI 3VAL PERCEP (MISCELLANEOUS) ×3 IMPLANT
NDL PERC 18GX7CM (NEEDLE) IMPLANT
NEEDLE PERC 18GX7CM (NEEDLE) ×3 IMPLANT
PACK CARDIAC CATH (CUSTOM PROCEDURE TRAY) ×3 IMPLANT
SHEATH AVANTI 5FR X 11CM (SHEATH) ×1 IMPLANT
SHEATH AVANTI 6FR X 11CM (SHEATH) ×2 IMPLANT
WIRE EMERALD 3MM-J .035X150CM (WIRE) ×1 IMPLANT
WIRE PRESSURE VERRATA (WIRE) ×1 IMPLANT

## 2016-04-27 NOTE — Progress Notes (Signed)
Pt clinically stable post heart cath, family present, vss, no bleeding nor hematoma at right groin site, Dr Gwen PoundsKowalski in to see pt with full discussion of results and explanation to pt and family. Vss, sr per monitor, discharge instructions given with return appointment given with questions answered,

## 2016-04-27 NOTE — Discharge Instructions (Signed)

## 2016-04-28 ENCOUNTER — Ambulatory Visit: Payer: PRIVATE HEALTH INSURANCE | Admitting: Family Medicine

## 2016-04-28 ENCOUNTER — Encounter: Payer: Self-pay | Admitting: Internal Medicine

## 2016-04-30 ENCOUNTER — Ambulatory Visit (INDEPENDENT_AMBULATORY_CARE_PROVIDER_SITE_OTHER): Payer: PRIVATE HEALTH INSURANCE | Admitting: Family Medicine

## 2016-04-30 ENCOUNTER — Encounter: Payer: Self-pay | Admitting: Family Medicine

## 2016-04-30 ENCOUNTER — Ambulatory Visit: Payer: PRIVATE HEALTH INSURANCE | Admitting: Family Medicine

## 2016-04-30 VITALS — BP 122/78 | HR 93 | Temp 97.4°F | Resp 18 | Ht 72.0 in | Wt 226.8 lb

## 2016-04-30 DIAGNOSIS — E669 Obesity, unspecified: Secondary | ICD-10-CM

## 2016-04-30 DIAGNOSIS — G4733 Obstructive sleep apnea (adult) (pediatric): Secondary | ICD-10-CM | POA: Diagnosis not present

## 2016-04-30 DIAGNOSIS — E785 Hyperlipidemia, unspecified: Secondary | ICD-10-CM | POA: Diagnosis not present

## 2016-04-30 DIAGNOSIS — F419 Anxiety disorder, unspecified: Secondary | ICD-10-CM

## 2016-04-30 MED ORDER — BELVIQ 10 MG PO TABS
1.0000 | ORAL_TABLET | Freq: Two times a day (BID) | ORAL | Status: DC
Start: 2016-04-30 — End: 2016-07-31

## 2016-04-30 MED ORDER — ALPRAZOLAM 1 MG PO TABS: 1.0000 mg | ORAL_TABLET | Freq: Four times a day (QID) | ORAL | Status: DC | PRN

## 2016-04-30 NOTE — Progress Notes (Signed)
Name: Cory Espinoza   MRN: [id number]    DOB: 09/14/1971   Date:04/30/2016       Progress Note  Subjective  Chief Complaint  Chief Complaint  Patient presents with  . Follow-up    2 mo  . Medication Refill    belviq / xanax 1 mg / lipitor 40 mg / clopidogrel 75 mg     HPI  Obesity: Pt. Has lost 10 lbs in 2 months now 219 lbs at home. He feels like Belviq is working well, no side effects reported.   Anxiety: Feels fine, anxiety is controlled, takes Alprazolam 1 mg 4 times daily as needed.   Hyperlipidemia:  Elevated total and LDL cholesterol, on Lipitor 40 mg at bedtime, will repeat FLP. Recently had a cardiac catheterization and being followed by Cardiology.    Past Medical History  Diagnosis Date  . Allergy   . Anxiety   . Hyperlipidemia   . Hypertension   . COPD (chronic obstructive pulmonary disease) (HCC)   . Coronary artery disease     Past Surgical History  Procedure Laterality Date  . Catheterization of pulmonary artery with retrieval of foreign body Bilateral 04/07/2011    heart  . Knee arthroscopy Bilateral 2004  . Lumbar disc surgery  1999  . Back surgery  1996  . Cardiac catheterization Left 04/27/2016    Procedure: Left Heart Cath and Coronary Angiography;  Surgeon: Lamar BlinksBruce J Kowalski, MD;  Location: [facility] INVASIVE CV LAB;  Service: Cardiovascular;  Laterality: Left;  . Cardiac catheterization N/A 04/27/2016    Procedure: Intravascular Pressure Wire/FFR Study;  Surgeon: Alwyn Peawayne D Callwood, MD;  Location: [facility] INVASIVE CV LAB;  Service: Cardiovascular;  Laterality: N/A;    Family History  Problem Relation Age of Onset  . Heart disease Mother   . Heart disease Father   . Diabetes Sister   . Hypertension Brother   . Cancer Paternal Grandmother     breast  . Diabetes Paternal Grandmother     Social History   Social History  . Marital Status: Married    Spouse Name: N/A  . Number of Children: N/A  . Years of Education: N/A   Occupational History  .  Not on file.   Social History Main Topics  . Smoking status: Former Smoker -- 2.00 packs/day for 23 years    Types: Cigarettes    Start date: 10/28/1989    Quit date: 10/28/2012  . Smokeless tobacco: Current User    Types: Snuff  . Alcohol Use: 0.0 oz/week    0 Standard drinks or equivalent per week     Comment: rarely  . Drug Use: No  . Sexual Activity:    Partners: Female   Other Topics Concern  . Not on file   Social History Narrative     Current outpatient prescriptions:  .  ALPRAZolam (XANAX) 1 MG tablet, Take 1 tablet (1 mg total) by mouth 4 (four) times daily as needed for anxiety., Disp: 120 tablet, Rfl: 2 .  atorvastatin (LIPITOR) 40 MG tablet, Take 1 tablet (40 mg total) by mouth daily at 6 PM., Disp: 90 tablet, Rfl: 1 .  B COMPLEX VITAMINS PO, Take by mouth., Disp: , Rfl:  .  BELVIQ 10 MG TABS, , Disp: , Rfl: 1 .  budesonide-formoterol (SYMBICORT) 160-4.5 MCG/ACT inhaler, Inhale into the lungs., Disp: , Rfl:  .  clopidogrel (PLAVIX) 75 MG tablet, Take 1 tablet (75 mg total) by mouth daily., Disp: 90 tablet, Rfl: 1 .  isosorbide mononitrate (IMDUR) 30 MG 24 hr tablet, , Disp: , Rfl: 0 .  Saw Palmetto 160 MG CAPS, Take by mouth., Disp: , Rfl:   Allergies  Allergen Reactions  . Amoxicillin Other (See Comments)  . Aspirin Other (See Comments)    swelling of throat swelling of throat  . Codeine Hives  . Hydrocodone-Acetaminophen Other (See Comments)    itching itching  . Oxycodone-Acetaminophen Nausea And Vomiting  . Oxycodone-Acetaminophen Nausea And Vomiting  . Penicillins Other (See Comments)    unknown unknown     Review of Systems  Constitutional: Negative for fever, chills and weight loss.  Respiratory: Negative for cough and shortness of breath.   Cardiovascular: Positive for chest pain (had chest pain, nausea, left upper back pain, recently had a Cath.).  Musculoskeletal: Negative for myalgias.  Psychiatric/Behavioral: Negative for depression. The  patient is not nervous/anxious.     Objective  Filed Vitals:   04/30/16 1122  BP: 122/78  Pulse: 93  Temp: 97.4 F (36.3 C)  TempSrc: Oral  Resp: 18  Height: 6' (1.829 m)  Weight: 226 lb 12.8 oz (102.876 kg)  SpO2: 96%    Physical Exam  Constitutional: He is oriented to person, place, and time and well-developed, well-nourished, and in no distress.  Cardiovascular: Normal rate and regular rhythm.   Pulmonary/Chest: Effort normal and breath sounds normal.  Neurological: He is alert and oriented to person, place, and time.  Psychiatric: Mood, memory, affect and judgment normal.  Nursing note and vitals reviewed.     Assessment & Plan  1. Hyperlipidemia Recheck FLP, consider switching from Lipitor to Crestor. - Lipid Profile  2. Anxiety Abel, taking alprazolam 4 times daily as needed. Refills provided - ALPRAZolam (XANAX) 1 MG tablet; Take 1 tablet (1 mg total) by mouth 4 (four) times daily as needed for anxiety.  Dispense: 120 tablet; Refill: 2  3. Obesity (BMI 30-39.9) Has lost 10 pounds in 2 months. Continue on Belviq for another 3 months and recheck. - BELVIQ 10 MG TABS; Take 1 tablet by mouth 2 (two) times daily.  Dispense: 60 tablet; Refill: 2   Emmajean Ratledge Asad A. [facility] 04/30/2016 11:29 AM

## 2016-05-02 LAB — LIPID PANEL
Chol/HDL Ratio: 3.4 ratio units (ref 0.0–5.0)
Cholesterol, Total: 137 mg/dL (ref 100–199)
HDL: 40 mg/dL (ref >39)
LDL Calculated: 81 mg/dL (ref 0–99)
Triglycerides: 79 mg/dL (ref 0–149)
VLDL Cholesterol Cal: 16 mg/dL (ref 5–40)

## 2016-05-07 DIAGNOSIS — G4733 Obstructive sleep apnea (adult) (pediatric): Secondary | ICD-10-CM | POA: Diagnosis not present

## 2016-05-07 DIAGNOSIS — E7801 Familial hypercholesterolemia: Secondary | ICD-10-CM | POA: Diagnosis not present

## 2016-05-07 DIAGNOSIS — I2510 Atherosclerotic heart disease of native coronary artery without angina pectoris: Secondary | ICD-10-CM | POA: Diagnosis not present

## 2016-07-19 ENCOUNTER — Emergency Department
Admission: EM | Admit: 2016-07-19 | Discharge: 2016-07-19 | Disposition: A | Discharge status: Home / Self Care | Payer: Worker's Compensation | Attending: Student | Admitting: Student

## 2016-07-19 ENCOUNTER — Encounter: Payer: Self-pay | Admitting: Emergency Medicine

## 2016-07-19 ENCOUNTER — Emergency Department: Payer: Worker's Compensation

## 2016-07-19 DIAGNOSIS — Y990 Civilian activity done for income or pay: Secondary | ICD-10-CM | POA: Diagnosis not present

## 2016-07-19 DIAGNOSIS — X501XXA Overexertion from prolonged static or awkward postures, initial encounter: Secondary | ICD-10-CM | POA: Insufficient documentation

## 2016-07-19 DIAGNOSIS — Y929 Unspecified place or not applicable: Secondary | ICD-10-CM | POA: Insufficient documentation

## 2016-07-19 DIAGNOSIS — S93402A Sprain of unspecified ligament of left ankle, initial encounter: Secondary | ICD-10-CM

## 2016-07-19 DIAGNOSIS — I2510 Atherosclerotic heart disease of native coronary artery without angina pectoris: Secondary | ICD-10-CM | POA: Diagnosis not present

## 2016-07-19 DIAGNOSIS — I10 Essential (primary) hypertension: Secondary | ICD-10-CM | POA: Diagnosis not present

## 2016-07-19 DIAGNOSIS — Y9301 Activity, walking, marching and hiking: Secondary | ICD-10-CM | POA: Insufficient documentation

## 2016-07-19 DIAGNOSIS — Z87891 Personal history of nicotine dependence: Secondary | ICD-10-CM | POA: Diagnosis not present

## 2016-07-19 DIAGNOSIS — J449 Chronic obstructive pulmonary disease, unspecified: Secondary | ICD-10-CM | POA: Diagnosis not present

## 2016-07-19 DIAGNOSIS — S93602A Unspecified sprain of left foot, initial encounter: Secondary | ICD-10-CM

## 2016-07-19 DIAGNOSIS — M25572 Pain in left ankle and joints of left foot: Secondary | ICD-10-CM | POA: Diagnosis present

## 2016-07-19 MED ORDER — IBUPROFEN 600 MG PO TABS: 600.0000 mg | ORAL_TABLET | Freq: Four times a day (QID) | ORAL | 0 refills | Status: DC | PRN

## 2016-07-19 NOTE — ED Triage Notes (Signed)
Pt states was at work this am around 1100 when he twisted foot and ankle of left leg while stepping into a culvert. Cms intact to toes.

## 2016-07-19 NOTE — ED Provider Notes (Signed)
[facility] Emergency Department Provider Note  ____________________________________________  Time seen: Approximately 8:34 PM  I have reviewed the triage vital signs and the nursing notes.   HISTORY  Chief Complaint Foot Injury    HPI Cory Espinoza is a 45 y.o. male , NAD, presents to the emergency department with one hour history of left ankle and foot pain. Patient is a Engineer, structural for [employer] and while he was leaving a call, he was walking down her driveway and rolled his left ankle where the driveway met the pavement. States he had some immediate pain, swelling and tingling to the ankle and foot prompting him to loosen issue. Has taken 2 tablets of ibuprofen which has seemed to help. Denies any current numbness, weakness, tingling of the left lower extremity. As noted no redness, warmth, or open wounds or lacerations. No history of injuries to the left ankle or foot. Denies any chest pain, shortness of breath to calls the injury and states it was mechanical in nature.   Past Medical History:  Diagnosis Date  . Allergy   . Anxiety   . COPD (chronic obstructive pulmonary disease) ([place])   . Coronary artery disease   . Hyperlipidemia   . Hypertension     Patient Active Problem List   Diagnosis Date Noted  . Unstable angina ([place]) 04/27/2016  . Obesity (BMI 30-39.9) 01/29/2016  . Elevated ALT measurement 10/30/2015  . Loss of libido 10/29/2015  . Hyperlipidemia 08/06/2015  . Anxiety 08/06/2015  . Atherosclerosis of coronary artery 08/06/2015  . Childhood asthma 08/06/2015  . CAFL (chronic airflow limitation) ([place]) 08/06/2015  . Obstructive apnea 08/06/2015    Past Surgical History:  Procedure Laterality Date  . BACK SURGERY  1996  . CARDIAC CATHETERIZATION Left 04/27/2016   Procedure: Left Heart Cath and Coronary Angiography;  Surgeon: Corey Skains, MD;  Location: [facility] CV [facility];  Service: Cardiovascular;   Laterality: Left;  . CARDIAC CATHETERIZATION N/A 04/27/2016   Procedure: Intravascular Pressure Wire/FFR Study;  Surgeon: Yolonda Kida, MD;  Location: [facility] CV LAB;  Service: Cardiovascular;  Laterality: N/A;  . CATHETERIZATION OF PULMONARY ARTERY WITH RETRIEVAL OF FOREIGN BODY Bilateral 04/07/2011   heart  . KNEE ARTHROSCOPY Bilateral 2004  . [place]    Prior to Admission medications   Medication Sig Start Date End Date Taking? Authorizing Provider  ALPRAZolam Duanne Moron) 1 MG tablet Take 1 tablet (1 mg total) by mouth 4 (four) times daily as needed for anxiety. 04/30/16   Roselee Nova, MD  atorvastatin (LIPITOR) 40 MG tablet Take 1 tablet (40 mg total) by mouth daily at 6 PM. 01/29/16   Roselee Nova, MD  B COMPLEX VITAMINS PO Take by mouth.    Historical Provider, MD  BELVIQ 10 MG TABS Take 1 tablet by mouth 2 (two) times daily. 04/30/16   Roselee Nova, MD  budesonide-formoterol [facility]) 160-4.5 MCG/ACT inhaler Inhale into the lungs. 04/04/15   Historical Provider, MD  clopidogrel (PLAVIX) 75 MG tablet Take 1 tablet (75 mg total) by mouth daily. 01/29/16   Roselee Nova, MD  ibuprofen (ADVIL,MOTRIN) 600 MG tablet Take 1 tablet (600 mg total) by mouth every 6 (six) hours as needed. 07/19/16   Anwita Mencer L Champayne Kocian, PA-C  isosorbide mononitrate (IMDUR) 30 MG 24 hr tablet  04/24/16   Historical Provider, MD  Saw Palmetto 160 MG CAPS Take by mouth.    Historical  Provider, MD    Allergies Amoxicillin; Aspirin; Codeine; Hydrocodone-acetaminophen; Oxycodone-acetaminophen; Oxycodone-acetaminophen; and Penicillins  Family History  Problem Relation Age of Onset  . Heart disease Mother   . Heart disease Father   . Diabetes Sister   . Hypertension Brother   . Cancer Paternal Grandmother     breast  . Diabetes Paternal Grandmother     Social History Social History  Substance Use Topics  . Smoking status: Former Smoker    Packs/day: 2.00    Years: 23.00     Types: Cigarettes    Start date: 10/28/1989    Quit date: 10/28/2012  . Smokeless tobacco: Current User    Types: Snuff  . Alcohol use 0.0 oz/week     Comment: rarely     Review of Systems  Constitutional: No fatigue Cardiovascular: No chest pain. Respiratory: No shortness of breath.  Musculoskeletal: Positive left ankle and foot pain. Negative left lower leg, toe pain. Skin: Positive swelling left ankle and foot. Negative for rash or redness, warmth, skin sores, open wounds or lacerations. Neurological: No numbness, weakness, tingling. 10-point ROS otherwise negative.  ____________________________________________   PHYSICAL EXAM:  VITAL SIGNS: ED Triage Vitals  Enc Vitals Group     BP      Pulse      Resp      Temp      Temp src      SpO2      Weight      Height      Head Circumference      Peak Flow      Pain Score      Pain Loc      Pain Edu?      Excl. in [place]?      Constitutional: Alert and oriented. Well appearing and in no acute distress. Eyes: Conjunctivae are normal without icterus or injection Head: Atraumatic. Cardiovascular: Good peripheral circulation with 2+ pulses noted in the left lower extremity. Capillary refill is brisk in all digits of the left foot. Respiratory: Normal respiratory effort without tachypnea or retractions.  Musculoskeletal: Tenderness to palpation about the left medial ankle and left arch. Decreased range of motion of left ankle due to pain. Full range of motion of all left toes. No tenderness to palpation about the left foot except at the arch but no palpated abnormalities noted. No joint effusions. Neurologic:  Normal speech and language. No gross focal neurologic deficits are appreciated. Sensation to light touch grossly intact about the left lower extremity. Skin:  Trace clear ecchymosis and swelling noted about the medial left ankle. Skin is warm, dry and intact. No rash noted. Psychiatric: Mood and affect are normal. Speech  and behavior are normal. Patient exhibits appropriate insight and judgement.   ____________________________________________   LABS  None ____________________________________________  EKG  None ____________________________________________  RADIOLOGY I have personally viewed and evaluated these images (plain radiographs) as part of my medical decision making, as well as reviewing the written report by the radiologist.  Dg Ankle Complete Left  Result Date: 07/19/2016 CLINICAL DATA:  Twisting injury to left foot and ankle. Initial encounter. EXAM: LEFT ANKLE COMPLETE - 3+ VIEW COMPARISON:  None. FINDINGS: There is no evidence of fracture or dislocation. The ankle mortise is intact; the interosseous space is within normal limits. No talar tilt or subluxation is seen. The joint spaces are preserved. Mild dorsal soft tissue swelling is noted. IMPRESSION: No evidence of fracture or dislocation. Electronically Signed   By: Garald Balding  M.D.   On: 07/19/2016 20:58   Dg Foot Complete Left  Result Date: 07/19/2016 CLINICAL DATA:  45 year old male status post twisting injury at 1100 hours today. Pain. Initial encounter. EXAM: LEFT FOOT - COMPLETE 3+ VIEW COMPARISON:  Left ankle series from today reported separately. FINDINGS: Bone mineralization is within normal limits. Calcaneus intact. Midfoot appears intact. Metatarsals and phalanges appear intact. Joint spaces and alignment within normal limits. No fracture of the distal tibia or fibula identified, see left ankle series reported separately. IMPRESSION: No acute fracture or dislocation identified about the left foot. Electronically Signed   By: Genevie Ann M.D.   On: 07/19/2016 20:59    ____________________________________________    PROCEDURES  Procedure(s) performed: None   Procedures   Medications - No data to display   ____________________________________________   INITIAL IMPRESSION / ASSESSMENT AND PLAN / ED COURSE  Pertinent  labs & imaging results that were available during my care of the patient were reviewed by me and considered in my medical decision making (see chart for details).  Clinical Course    Patient's diagnosis is consistent with left foot and ankle sprain. Patient was placed in an ace wrap and given crutches to assist with ambulation over the next 5 days. Patient will be discharged home with prescriptions for ibuprofen to take as needed for swelling and pain. Advise to elevate the affected area when not ambulating and apply ice x 20 minutes 3-4 times daily. Patient given a work note with 5 days of restrictions to include no weight bearing on the left foot/ankle and to use his crutches. Patient is to follow up with Dr. Sabra Heck in orthopedics in 1 week if symptoms persist past this treatment course. Patient is given ED precautions to return to the ED for any worsening or new symptoms.    ____________________________________________  FINAL CLINICAL IMPRESSION(S) / ED DIAGNOSES  Final diagnoses:  Left ankle sprain, initial encounter  Foot sprain, left, initial encounter      NEW MEDICATIONS STARTED DURING THIS VISIT:  New Prescriptions   IBUPROFEN (ADVIL,MOTRIN) 600 MG TABLET    Take 1 tablet (600 mg total) by mouth every 6 (six) hours as needed.         Braxton Feathers, PA-C 07/19/16 2111    Joanne Gavel, MD 07/19/16 2358

## 2016-07-31 ENCOUNTER — Encounter: Payer: Self-pay | Admitting: Family Medicine

## 2016-07-31 ENCOUNTER — Ambulatory Visit (INDEPENDENT_AMBULATORY_CARE_PROVIDER_SITE_OTHER): Payer: PRIVATE HEALTH INSURANCE | Admitting: Family Medicine

## 2016-07-31 VITALS — BP 120/70 | HR 75 | Temp 98.5°F | Resp 18 | Ht 74.0 in | Wt 231.0 lb

## 2016-07-31 DIAGNOSIS — E669 Obesity, unspecified: Secondary | ICD-10-CM | POA: Diagnosis not present

## 2016-07-31 DIAGNOSIS — I2510 Atherosclerotic heart disease of native coronary artery without angina pectoris: Secondary | ICD-10-CM | POA: Diagnosis not present

## 2016-07-31 DIAGNOSIS — E785 Hyperlipidemia, unspecified: Secondary | ICD-10-CM

## 2016-07-31 DIAGNOSIS — F419 Anxiety disorder, unspecified: Secondary | ICD-10-CM | POA: Diagnosis not present

## 2016-07-31 MED ORDER — CLOPIDOGREL BISULFATE 75 MG PO TABS: 75.0000 mg | ORAL_TABLET | Freq: Every day | ORAL | 1 refills | Status: DC

## 2016-07-31 MED ORDER — ATORVASTATIN CALCIUM 40 MG PO TABS: 40.0000 mg | ORAL_TABLET | Freq: Every day | ORAL | 1 refills | Status: DC

## 2016-07-31 MED ORDER — ALPRAZOLAM 1 MG PO TABS: 1.0000 mg | ORAL_TABLET | Freq: Four times a day (QID) | ORAL | 2 refills | Status: DC | PRN

## 2016-07-31 NOTE — Progress Notes (Signed)
Name: Cory Espinoza   MRN: [id number]    DOB: 1971-07-18   Date:07/31/2016       Progress Note  Subjective  Chief Complaint  Chief Complaint  Patient presents with  . Weight Check    3 month follow up    Anxiety  Presents for initial visit. The problem has been unchanged. Symptoms include excessive worry, nervous/anxious behavior and panic. Patient reports no chest pain, palpitations or shortness of breath. The severity of symptoms is moderate and causing significant distress.   Past treatments include benzodiazephines. Compliance with prior treatments has been good.  Hyperlipidemia  This is a chronic problem. The problem is controlled. Recent lipid tests were reviewed and are normal. Pertinent negatives include no chest pain, myalgias or shortness of breath. Current antihyperlipidemic treatment includes statins.   Obesity: Pt. Presents for follow up on obesity, he is considered obese BMI of 29.66kg/m2. Taking Belviq 10 mg twice daily. He is following a balanced portion-controlled diet, was walking -35 days a week, and also uses a treadmill at home. Despite all this, he has gained at least 5 lbs in the last 3 months. We will stop Belviq and give him a drug holiday for one month.   Past Medical History:  Diagnosis Date  . Allergy   . Anxiety   . COPD (chronic obstructive pulmonary disease) (HCC)   . Coronary artery disease   . Hyperlipidemia   . Hypertension     Past Surgical History:  Procedure Laterality Date  . BACK SURGERY  1996  . CARDIAC CATHETERIZATION Left 04/27/2016   Procedure: Left Heart Cath and Coronary Angiography;  Surgeon: Lamar Blinks, MD;  Location: [facility] INVASIVE CV LAB;  Service: Cardiovascular;  Laterality: Left;  . CARDIAC CATHETERIZATION N/A 04/27/2016   Procedure: Intravascular Pressure Wire/FFR Study;  Surgeon: Alwyn Pea, MD;  Location: [facility] INVASIVE CV LAB;  Service: Cardiovascular;  Laterality: N/A;  . CATHETERIZATION OF PULMONARY ARTERY  WITH RETRIEVAL OF FOREIGN BODY Bilateral 04/07/2011   heart  . KNEE ARTHROSCOPY Bilateral 2004  . LUMBAR DISC SURGERY  1999    Family History  Problem Relation Age of Onset  . Heart disease Mother   . Heart disease Father   . Diabetes Sister   . Hypertension Brother   . Cancer Paternal Grandmother     breast  . Diabetes Paternal Grandmother     Social History   Social History  . Marital status: Married    Spouse name: N/A  . Number of children: N/A  . Years of education: N/A   Occupational History  . Not on file.   Social History Main Topics  . Smoking status: Former Smoker    Packs/day: 2.00    Years: 23.00    Types: Cigarettes    Start date: 10/28/1989    Quit date: 10/28/2012  . Smokeless tobacco: Current User    Types: Snuff  . Alcohol use 0.0 oz/week     Comment: rarely  . Drug use: No  . Sexual activity: Yes    Partners: Female   Other Topics Concern  . Not on file   Social History Narrative  . No narrative on file     Current Outpatient Prescriptions:  .  ALPRAZolam (XANAX) 1 MG tablet, Take 1 tablet (1 mg total) by mouth 4 (four) times daily as needed for anxiety., Disp: 120 tablet, Rfl: 2 .  atorvastatin (LIPITOR) 40 MG tablet, Take 1 tablet (40 mg total) by mouth daily at 6  PM., Disp: 90 tablet, Rfl: 1 .  B COMPLEX VITAMINS PO, Take by mouth., Disp: , Rfl:  .  BELVIQ 10 MG TABS, Take 1 tablet by mouth 2 (two) times daily., Disp: 60 tablet, Rfl: 2 .  budesonide-formoterol (SYMBICORT) 160-4.5 MCG/ACT inhaler, Inhale into the lungs., Disp: , Rfl:  .  clopidogrel (PLAVIX) 75 MG tablet, Take 1 tablet (75 mg total) by mouth daily., Disp: 90 tablet, Rfl: 1 .  ibuprofen (ADVIL,MOTRIN) 600 MG tablet, Take 1 tablet (600 mg total) by mouth every 6 (six) hours as needed., Disp: 30 tablet, Rfl: 0 .  isosorbide mononitrate (IMDUR) 30 MG 24 hr tablet, , Disp: , Rfl: 0 .  Saw Palmetto 160 MG CAPS, Take by mouth., Disp: , Rfl:   Allergies  Allergen Reactions  .  Amoxicillin Other (See Comments)  . Aspirin Other (See Comments)    swelling of throat swelling of throat  . Codeine Hives  . Hydrocodone-Acetaminophen Other (See Comments)    itching itching  . Oxycodone-Acetaminophen Nausea And Vomiting  . Oxycodone-Acetaminophen Nausea And Vomiting  . Penicillins Other (See Comments)    unknown unknown    Review of Systems  Constitutional: Negative for chills and fever.  Respiratory: Negative for shortness of breath.   Cardiovascular: Negative for chest pain, palpitations and leg swelling.  Gastrointestinal: Negative for abdominal pain.  Musculoskeletal: Negative for myalgias.  Psychiatric/Behavioral: The patient is nervous/anxious.      Objective  Vitals:   07/31/16 0952  BP: 120/70  Pulse: 75  Resp: 18  Temp: 98.5 F (36.9 C)  TempSrc: Oral  SpO2: 98%  Weight: 231 lb (104.8 kg)  Height: 6\' 2"  (1.88 m)    Physical Exam  Constitutional: He is well-developed, well-nourished, and in no distress.  HENT:  Head: Normocephalic and atraumatic.  Cardiovascular: Normal rate, regular rhythm, S1 normal, S2 normal and normal heart sounds.   No murmur heard. Pulmonary/Chest: Effort normal and breath sounds normal. He has no wheezes.  Musculoskeletal:       Right ankle: He exhibits no swelling.  Left ankle brace  Psychiatric: Mood, memory, affect and judgment normal.  Nursing note and vitals reviewed.    Assessment & Plan  1. Anxiety Stable, continue on alprazolam as prescribed - ALPRAZolam (XANAX) 1 MG tablet; Take 1 tablet (1 mg total) by mouth 4 (four) times daily as needed for anxiety.  Dispense: 120 tablet; Refill: 2  2. Atherosclerosis of native coronary artery of native heart without angina pectoris  - clopidogrel (PLAVIX) 75 MG tablet; Take 1 tablet (75 mg total) by mouth daily.  Dispense: 90 tablet; Refill: 1 - atorvastatin (LIPITOR) 40 MG tablet; Take 1 tablet (40 mg total) by mouth daily at 6 PM.  Dispense: 90 tablet;  Refill: 1  3. Hyperlipidemia  - atorvastatin (LIPITOR) 40 MG tablet; Take 1 tablet (40 mg total) by mouth daily at 6 PM.  Dispense: 90 tablet; Refill: 1  4. Obesity D/C Belviq and consider starting on a different anti-obesity agent in 4-6 weeks.    Chenita Ruda Asad A. Faylene [facility]zSh[facility] 07/31/2016 10:25 AM

## 2016-08-03 DIAGNOSIS — G4733 Obstructive sleep apnea (adult) (pediatric): Secondary | ICD-10-CM | POA: Diagnosis not present

## 2016-09-03 ENCOUNTER — Ambulatory Visit (INDEPENDENT_AMBULATORY_CARE_PROVIDER_SITE_OTHER): Payer: BLUE CROSS/BLUE SHIELD | Admitting: Family Medicine

## 2016-09-03 ENCOUNTER — Encounter: Payer: Self-pay | Admitting: Family Medicine

## 2016-09-03 VITALS — BP 122/65 | HR 103 | Temp 98.1°F | Resp 16 | Ht 74.0 in | Wt 239.3 lb

## 2016-09-03 DIAGNOSIS — E669 Obesity, unspecified: Secondary | ICD-10-CM | POA: Diagnosis not present

## 2016-09-03 LAB — TSH: TSH: 1.63 mIU/L (ref 0.40–4.50)

## 2016-09-03 MED ORDER — NALTREXONE-BUPROPION HCL ER 8-90 MG PO TB12: 2.0000 | ORAL_TABLET | Freq: Two times a day (BID) | ORAL | 0 refills | Status: AC

## 2016-09-03 NOTE — Progress Notes (Signed)
Name: Cory Espinoza   MRN: [id number]    DOB: 10/25/1971   Date:09/03/2016       Progress Note  Subjective  Chief Complaint  Chief Complaint  Patient presents with  . Follow-up    1 mo    HPI  Obesity: Pt. Presents to discuss obesity, has gained 8 lbs in last month, now weight 239 lb, BMI is 30.72kg/m2. He is physically active, watches what he eats but is not able to lose weight. 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  He was given a 1 month drug holiday and now presents to discuss starting on a different anti-obesity agent.   Past Medical History:  Diagnosis Date  . Allergy   . Anxiety   . COPD (chronic obstructive pulmonary disease) (HCC)   . Coronary artery disease   . Hyperlipidemia   . Hypertension     Past Surgical History:  Procedure Laterality Date  . BACK SURGERY  1996  . CARDIAC CATHETERIZATION Left 04/27/2016   Procedure: Left Heart Cath and Coronary Angiography;  Surgeon: Lamar Blinks, MD;  Location: [facility];  Service: Cardiovascular;  Laterality: Left;  . CARDIAC CATHETERIZATION N/A 04/27/2016   Procedure: Intravascular Pressure Wire/FFR Study;  Surgeon: Alwyn Pea, MD;  Location: [facility];  Service: Cardiovascular;  Laterality: N/A;  . CATHETERIZATION OF PULMONARY ARTERY WITH RETRIEVAL OF FOREIGN BODY Bilateral 04/07/2011   heart  . KNEE ARTHROSCOPY Bilateral 2004  . LUMBAR DISC SURGERY  1999    Family History  Problem Relation Age of Onset  . Heart disease Mother   . Heart disease Father   . Diabetes Sister   . Hypertension Brother   . Cancer Paternal Grandmother     breast  . Diabetes Paternal Grandmother     Social History   Social History  . Marital status: Married    Spouse name: N/A  . Number of children: N/A  . Years of education: N/A   Occupational History  . Not on file.   Social History Main Topics   . Smoking status: Former Smoker    Packs/day: 2.00    Years: 23.00    Types: Cigarettes    Start date: 10/28/1989    Quit date: 10/28/2012  . Smokeless tobacco: Current User    Types: Snuff  . Alcohol use 0.0 oz/week     Comment: rarely  . Drug use: No  . Sexual activity: Yes    Partners: Female   Other Topics Concern  . Not on file   Social History Narrative  . No narrative on file     Current Outpatient Prescriptions:  .  ALPRAZolam (XANAX) 1 MG tablet, Take 1 tablet (1 mg total) by mouth 4 (four) times daily as needed for anxiety., Disp: 120 tablet, Rfl: 2 .  atorvastatin (LIPITOR) 40 MG tablet, Take 1 tablet (40 mg total) by mouth daily at 6 PM., Disp: 90 tablet, Rfl: 1 .  B COMPLEX VITAMINS PO, Take by mouth., Disp: , Rfl:  .  budesonide-formoterol (SYMBICORT) 160-4.5 MCG/ACT inhaler, Inhale into the lungs., Disp: , Rfl:  .  clopidogrel (PLAVIX) 75 MG tablet, Take 1 tablet (75 mg total) by mouth daily., Disp: 90 tablet, Rfl: 1 .  ibuprofen (ADVIL,MOTRIN) 600 MG tablet, Take 1 tablet (600 mg total) by mouth every 6 (six) hours as needed., Disp: 30 tablet, Rfl: 0 .  isosorbide mononitrate (IMDUR) 30 MG 24 hr tablet, , Disp: , Rfl: 0 .  Saw Palmetto 160 MG CAPS, Take by mouth., Disp: , Rfl:   Allergies  Allergen Reactions  . Amoxicillin Other (See Comments)  . Aspirin Other (See Comments)    swelling of throat swelling of throat  . Codeine Hives  . Hydrocodone-Acetaminophen Other (See Comments)    itching itching  . Oxycodone-Acetaminophen Nausea And Vomiting  . Oxycodone-Acetaminophen Nausea And Vomiting  . Penicillins Other (See Comments)    unknown unknown     Review of Systems  Constitutional: Negative for chills, fever and malaise/fatigue.  Cardiovascular: Negative for chest pain.  Gastrointestinal: Negative for abdominal pain.  Psychiatric/Behavioral: The patient has insomnia.     Objective  Vitals:   09/03/16 1324  BP: 122/65  Pulse: (!) 103   Resp: 16  Temp: 98.1 F (36.7 C)  TempSrc: Oral  SpO2: 96%  Weight: 239 lb 4.8 oz (108.5 kg)  Height: 6\' 2"  (1.88 m)    Physical Exam  Constitutional: He is oriented to person, place, and time and well-developed, well-nourished, and in no distress.  HENT:  Head: Normocephalic and atraumatic.  Cardiovascular: Normal rate, regular rhythm and normal heart sounds.   No murmur heard. Pulmonary/Chest: Effort normal and breath sounds normal. He has no wheezes.  Abdominal: Soft. Bowel sounds are normal. There is no tenderness.  Musculoskeletal: He exhibits no edema.  Neurological: He is alert and oriented to person, place, and time.  Skin: Skin is warm and dry.  Psychiatric: Mood, memory, affect and judgment normal.  Nursing note and vitals reviewed.     Assessment & Plan  1. Obesity Patient has failed therapy with Belviq, we'll start on Contrave, reassess in 12 weeks. Patient will restart his exercise routine. Rule out hypothyroidism as the cause for obesity - Naltrexone-Bupropion HCl ER 8-90 MG TB12; Take 2 tablets by mouth 2 (two) times daily. Week 1: 1 tab po qAM Week 2: 1 tab BID Week 3: 2 tabs qAM, 1 tab qPM Week 4 2 tabs BID  Dispense: 360 tablet; Refill: 0 - TSH   Brandee Markin Asad A. Faylene KurtzShah [facility] 09/03/2016 1:55 PM

## 2016-09-24 DIAGNOSIS — H524 Presbyopia: Secondary | ICD-10-CM | POA: Diagnosis not present

## 2016-09-24 DIAGNOSIS — H5210 Myopia, unspecified eye: Secondary | ICD-10-CM | POA: Diagnosis not present

## 2016-10-09 ENCOUNTER — Encounter: Payer: Self-pay | Admitting: Family Medicine

## 2016-11-02 ENCOUNTER — Ambulatory Visit: Payer: BLUE CROSS/BLUE SHIELD | Admitting: Family Medicine

## 2016-11-04 ENCOUNTER — Encounter: Payer: Self-pay | Admitting: Family Medicine

## 2016-11-04 ENCOUNTER — Ambulatory Visit (INDEPENDENT_AMBULATORY_CARE_PROVIDER_SITE_OTHER): Payer: BLUE CROSS/BLUE SHIELD | Admitting: Family Medicine

## 2016-11-04 DIAGNOSIS — F419 Anxiety disorder, unspecified: Secondary | ICD-10-CM

## 2016-11-04 DIAGNOSIS — G4733 Obstructive sleep apnea (adult) (pediatric): Secondary | ICD-10-CM | POA: Diagnosis not present

## 2016-11-04 MED ORDER — ALPRAZOLAM 1 MG PO TABS: 1.0000 mg | ORAL_TABLET | Freq: Four times a day (QID) | ORAL | 2 refills | Status: DC | PRN

## 2016-11-04 NOTE — Progress Notes (Signed)
Name: Cory GriefJoseph D Chamberlain   MRN: [id number]    DOB: 01/05/1971   Date:11/04/2016       Progress Note  Subjective  Chief Complaint  Chief Complaint  Patient presents with  . Follow-up    medication refills    Anxiety  Presents for follow-up visit. Symptoms include excessive worry. Patient reports no decreased concentration, depressed mood, irritability, nervous/anxious behavior or panic. Symptoms occur most days. The severity of symptoms is moderate and causing significant distress.      Past Medical History:  Diagnosis Date  . Allergy   . Anxiety   . COPD (chronic obstructive pulmonary disease) (HCC)   . Coronary artery disease   . Hyperlipidemia   . Hypertension     Past Surgical History:  Procedure Laterality Date  . BACK SURGERY  1996  . CARDIAC CATHETERIZATION Left 04/27/2016   Procedure: Left Heart Cath and Coronary Angiography;  Surgeon: Lamar BlinksBruce J Kowalski, MD;  Location: [facility];  Service: Cardiovascular;  Laterality: Left;  . CARDIAC CATHETERIZATION N/A 04/27/2016   Procedure: Intravascular Pressure Wire/FFR Study;  Surgeon: Alwyn Peawayne D Callwood, MD;  Location: [facility];  Service: Cardiovascular;  Laterality: N/A;  . CATHETERIZATION OF PULMONARY ARTERY WITH RETRIEVAL OF FOREIGN BODY Bilateral 04/07/2011   heart  . KNEE ARTHROSCOPY Bilateral 2004  . LUMBAR DISC SURGERY  1999    Family History  Problem Relation Age of Onset  . Heart disease Mother   . Heart disease Father   . Diabetes Sister   . Hypertension Brother   . Cancer Paternal Grandmother     breast  . Diabetes Paternal Grandmother     Social History   Social History  . Marital status: Married    Spouse name: N/A  . Number of children: N/A  . Years of education: N/A   Occupational History  . Not on file.   Social History Main Topics  . Smoking status: Former Smoker    Packs/day: 2.00    Years: 23.00    Types: Cigarettes    Start date: 10/28/1989    Quit date: 10/28/2012   . Smokeless tobacco: Current User    Types: Snuff  . Alcohol use 0.0 oz/week     Comment: rarely  . Drug use: No  . Sexual activity: Yes    Partners: Female   Other Topics Concern  . Not on file   Social History Narrative  . No narrative on file     Current Outpatient Prescriptions:  .  ALPRAZolam (XANAX) 1 MG tablet, Take 1 tablet (1 mg total) by mouth 4 (four) times daily as needed for anxiety., Disp: 120 tablet, Rfl: 2 .  atorvastatin (LIPITOR) 40 MG tablet, Take 1 tablet (40 mg total) by mouth daily at 6 PM., Disp: 90 tablet, Rfl: 1 .  B COMPLEX VITAMINS PO, Take by mouth., Disp: , Rfl:  .  budesonide-formoterol (SYMBICORT) 160-4.5 MCG/ACT inhaler, Inhale into the lungs., Disp: , Rfl:  .  clopidogrel (PLAVIX) 75 MG tablet, Take 1 tablet (75 mg total) by mouth daily., Disp: 90 tablet, Rfl: 1 .  ibuprofen (ADVIL,MOTRIN) 600 MG tablet, Take 1 tablet (600 mg total) by mouth every 6 (six) hours as needed., Disp: 30 tablet, Rfl: 0 .  isosorbide mononitrate (IMDUR) 30 MG 24 hr tablet, , Disp: , Rfl: 0 .  Naltrexone-Bupropion HCl ER 8-90 MG TB12, Take 2 tablets by mouth 2 (two) times daily. Week 1: 1 tab po qAM Week 2: 1 tab  BID Week 3: 2 tabs qAM, 1 tab qPM Week 4 2 tabs BID, Disp: 360 tablet, Rfl: 0 .  Saw Palmetto 160 MG CAPS, Take by mouth., Disp: , Rfl:   Allergies  Allergen Reactions  . Amoxicillin Other (See Comments)  . Aspirin Other (See Comments)    swelling of throat swelling of throat  . Codeine Hives  . Hydrocodone-Acetaminophen Other (See Comments)    itching itching  . Oxycodone-Acetaminophen Nausea And Vomiting  . Oxycodone-Acetaminophen Nausea And Vomiting  . Penicillins Other (See Comments)    unknown unknown     Review of Systems  Constitutional: Negative for irritability.  Psychiatric/Behavioral: Negative for decreased concentration. The patient is not nervous/anxious.      Objective  Vitals:   11/04/16 1501  BP: 124/80  Pulse: 99  Resp: 18   Temp: 98.7 F (37.1 C)  TempSrc: Oral  SpO2: 96%  Weight: 238 lb (108 kg)  Height: 6\' 2"  (1.88 m)    Physical Exam  Constitutional: He is oriented to person, place, and time and well-developed, well-nourished, and in no distress.  HENT:  Head: Normocephalic and atraumatic.  Cardiovascular: Normal rate, regular rhythm and normal heart sounds.   No murmur heard. Pulmonary/Chest: Effort normal and breath sounds normal. He has no wheezes.  Neurological: He is alert and oriented to person, place, and time.  Psychiatric: Mood, memory, affect and judgment normal.  Nursing note and vitals reviewed.      Recent Results (from the past 2160 hour(s))  TSH     Status: None   Collection Time: 09/03/16  2:17 PM  Result Value Ref Range   TSH 1.63 0.40 - 4.50 mIU/L     Assessment & Plan  1. Anxiety Stable, responsive to alprazolam taken up to 4 times daily when needed. - ALPRAZolam (XANAX) 1 MG tablet; Take 1 tablet (1 mg total) by mouth 4 (four) times daily as needed for anxiety.  Dispense: 120 tablet; Refill: 2   Tanay Misuraca Asad A. Faylene KurtzShah [facility] 11/04/2016 3:26 PM

## 2016-12-28 ENCOUNTER — Ambulatory Visit: Payer: Self-pay | Admitting: Physician Assistant

## 2016-12-28 VITALS — BP 130/89 | HR 96 | Temp 98.3°F

## 2016-12-28 DIAGNOSIS — R05 Cough: Secondary | ICD-10-CM

## 2016-12-28 DIAGNOSIS — R059 Cough, unspecified: Secondary | ICD-10-CM

## 2016-12-28 DIAGNOSIS — J069 Acute upper respiratory infection, unspecified: Secondary | ICD-10-CM

## 2016-12-28 LAB — POCT INFLUENZA A/B
Influenza A, POC: NEGATIVE
Influenza B, POC: NEGATIVE

## 2016-12-29 NOTE — Progress Notes (Signed)
See notes by J Stump FNP  

## 2017-02-03 ENCOUNTER — Ambulatory Visit: Payer: BLUE CROSS/BLUE SHIELD | Admitting: Family Medicine

## 2017-02-05 ENCOUNTER — Encounter: Payer: Self-pay | Admitting: Family Medicine

## 2017-02-05 ENCOUNTER — Ambulatory Visit (INDEPENDENT_AMBULATORY_CARE_PROVIDER_SITE_OTHER): Payer: BLUE CROSS/BLUE SHIELD | Admitting: Family Medicine

## 2017-02-05 VITALS — BP 128/82 | HR 76 | Temp 98.4°F | Resp 18 | Ht 74.0 in | Wt 240.0 lb

## 2017-02-05 DIAGNOSIS — F419 Anxiety disorder, unspecified: Secondary | ICD-10-CM

## 2017-02-05 DIAGNOSIS — J449 Chronic obstructive pulmonary disease, unspecified: Secondary | ICD-10-CM

## 2017-02-05 DIAGNOSIS — E7800 Pure hypercholesterolemia, unspecified: Secondary | ICD-10-CM

## 2017-02-05 DIAGNOSIS — I2510 Atherosclerotic heart disease of native coronary artery without angina pectoris: Secondary | ICD-10-CM

## 2017-02-05 MED ORDER — CLOPIDOGREL BISULFATE 75 MG PO TABS: 75.0000 mg | ORAL_TABLET | Freq: Every day | ORAL | 1 refills | Status: DC

## 2017-02-05 MED ORDER — ATORVASTATIN CALCIUM 40 MG PO TABS: 40.0000 mg | ORAL_TABLET | Freq: Every day | ORAL | 1 refills | Status: DC

## 2017-02-05 MED ORDER — BUDESONIDE-FORMOTEROL FUMARATE 160-4.5 MCG/ACT IN AERO: 2.0000 | INHALATION_SPRAY | Freq: Every morning | RESPIRATORY_TRACT | 5 refills | Status: DC

## 2017-02-05 MED ORDER — ALPRAZOLAM 1 MG PO TABS: 1.0000 mg | ORAL_TABLET | Freq: Four times a day (QID) | ORAL | 2 refills | Status: DC | PRN

## 2017-02-05 NOTE — Progress Notes (Signed)
Name: Cory Espinoza   MRN: [id number]    DOB: 1971/12/01   Date:02/05/2017       Progress Note  Subjective  Chief Complaint  Chief Complaint  Patient presents with  . Medication Refill    Anxiety  Presents for initial visit. The problem has been unchanged. Symptoms include excessive worry, insomnia, irritability, nervous/anxious behavior and panic. Patient reports no chest pain, palpitations or shortness of breath. The severity of symptoms is moderate and causing significant distress.   Past treatments include benzodiazephines. Compliance with prior treatments has been good.  Hyperlipidemia  This is a chronic problem. The problem is controlled. Recent lipid tests were reviewed and are normal. Pertinent negatives include no chest pain, myalgias or shortness of breath. Current antihyperlipidemic treatment includes statins.   Asthma: Pt. Has hx of childhood asthma, and also diagnosed with COPD as an adult. He smokes 2 PPD for many years. Uses Symbicort 2 puffs daily, no shortness of breath chest pain, or decreased exercise tolerance. He has now quit smoking.    Past Medical History:  Diagnosis Date  . Allergy   . Anxiety   . COPD (chronic obstructive pulmonary disease) (HCC)   . Coronary artery disease   . Hyperlipidemia   . Hypertension     Past Surgical History:  Procedure Laterality Date  . BACK SURGERY  1996  . CARDIAC CATHETERIZATION Left 04/27/2016   Procedure: Left Heart Cath and Coronary Angiography;  Surgeon: Lamar Blinks, MD;  Location: [facility];  Service: Cardiovascular;  Laterality: Left;  . CARDIAC CATHETERIZATION N/A 04/27/2016   Procedure: Intravascular Pressure Wire/FFR Study;  Surgeon: Alwyn Pea, MD;  Location: [facility];  Service: Cardiovascular;  Laterality: N/A;  . CATHETERIZATION OF PULMONARY ARTERY WITH RETRIEVAL OF FOREIGN BODY Bilateral 04/07/2011   heart  . KNEE ARTHROSCOPY Bilateral 2004  . LUMBAR DISC SURGERY  1999     Family History  Problem Relation Age of Onset  . Heart disease Mother   . Heart disease Father   . Diabetes Sister   . Hypertension Brother   . Cancer Paternal Grandmother     breast  . Diabetes Paternal Grandmother     Social History   Social History  . Marital status: Married    Spouse name: N/A  . Number of children: N/A  . Years of education: N/A   Occupational History  . Not on file.   Social History Main Topics  . Smoking status: Former Smoker    Packs/day: 2.00    Years: 23.00    Types: Cigarettes    Start date: 10/28/1989    Quit date: 10/28/2012  . Smokeless tobacco: Current User    Types: Snuff  . Alcohol use 0.0 oz/week     Comment: rarely  . Drug use: No  . Sexual activity: Yes    Partners: Female   Other Topics Concern  . Not on file   Social History Narrative  . No narrative on file     Current Outpatient Prescriptions:  .  ALPRAZolam (XANAX) 1 MG tablet, Take 1 tablet (1 mg total) by mouth 4 (four) times daily as needed for anxiety., Disp: 120 tablet, Rfl: 2 .  atorvastatin (LIPITOR) 40 MG tablet, Take 1 tablet (40 mg total) by mouth daily at 6 PM., Disp: 90 tablet, Rfl: 1 .  B COMPLEX VITAMINS PO, Take by mouth., Disp: , Rfl:  .  budesonide-formoterol (SYMBICORT) 160-4.5 MCG/ACT inhaler, Inhale into the lungs., Disp: ,  Rfl:  .  clopidogrel (PLAVIX) 75 MG tablet, Take 1 tablet (75 mg total) by mouth daily., Disp: 90 tablet, Rfl: 1 .  ibuprofen (ADVIL,MOTRIN) 600 MG tablet, Take 1 tablet (600 mg total) by mouth every 6 (six) hours as needed., Disp: 30 tablet, Rfl: 0 .  isosorbide mononitrate (IMDUR) 30 MG 24 hr tablet, , Disp: , Rfl: 0 .  Saw Palmetto 160 MG CAPS, Take by mouth., Disp: , Rfl:   Allergies  Allergen Reactions  . Amoxicillin Other (See Comments)  . Aspirin Other (See Comments)    swelling of throat swelling of throat  . Codeine Hives  . Hydrocodone-Acetaminophen Other (See Comments)    itching itching  .  Oxycodone-Acetaminophen Nausea And Vomiting  . Oxycodone-Acetaminophen Nausea And Vomiting  . Penicillins Other (See Comments)    unknown unknown     Review of Systems  Constitutional: Positive for irritability.  Respiratory: Negative for shortness of breath.   Cardiovascular: Negative for chest pain and palpitations.  Musculoskeletal: Negative for myalgias.  Psychiatric/Behavioral: The patient is nervous/anxious and has insomnia.      Objective  Vitals:   02/05/17 0811  BP: 128/82  Pulse: 76  Resp: 18  Temp: 98.4 F (36.9 C)  TempSrc: Oral  SpO2: 97%  Weight: 240 lb (108.9 kg)  Height: 6\' 2"  (1.88 m)    Physical Exam  Constitutional: He is oriented to person, place, and time and well-developed, well-nourished, and in no distress.  HENT:  Head: Normocephalic and atraumatic.  Cardiovascular: Normal rate, regular rhythm and normal heart sounds.   No murmur heard. Pulmonary/Chest: Effort normal and breath sounds normal. He has no wheezes.  Abdominal: Soft. Bowel sounds are normal. There is no tenderness.  Musculoskeletal: He exhibits no edema.  Neurological: He is alert and oriented to person, place, and time.  Skin: Skin is warm and dry.  Psychiatric: Mood, memory, affect and judgment normal.  Nursing note and vitals reviewed.    Assessment & Plan  1. Anxiety Stable on alprazolam taken up to 4 times daily when needed - ALPRAZolam (XANAX) 1 MG tablet; Take 1 tablet (1 mg total) by mouth 4 (four) times daily as needed for anxiety.  Dispense: 120 tablet; Refill: 2  2. Atherosclerosis of native coronary artery of native heart without angina pectoris Follows with cardiology, continue on Plavix and atorvastatin - clopidogrel (PLAVIX) 75 MG tablet; Take 1 tablet (75 mg total) by mouth daily.  Dispense: 90 tablet; Refill: 1 - atorvastatin (LIPITOR) 40 MG tablet; Take 1 tablet (40 mg total) by mouth daily at 6 PM.  Dispense: 90 tablet; Refill: 1  3. Pure  hypercholesterolemia  - atorvastatin (LIPITOR) 40 MG tablet; Take 1 tablet (40 mg total) by mouth daily at 6 PM.  Dispense: 90 tablet; Refill: 1  4. Chronic obstructive pulmonary disease, unspecified COPD type (HCC) Refills for Symbicort provided. - budesonide-formoterol (SYMBICORT) 160-4.5 MCG/ACT inhaler; Inhale 2 puffs into the lungs every morning.  Dispense: 1 Inhaler; Refill: 5   Kasidy Gianino Asad A. Faylene KurtzShah [facility] 02/05/2017 8:18 AM

## 2017-04-22 ENCOUNTER — Ambulatory Visit: Payer: Self-pay | Admitting: Registered Nurse

## 2017-04-22 VITALS — BP 120/70 | HR 93 | Temp 97.8°F

## 2017-04-22 DIAGNOSIS — N4289 Other specified disorders of prostate: Secondary | ICD-10-CM | POA: Insufficient documentation

## 2017-04-22 DIAGNOSIS — I10 Essential (primary) hypertension: Secondary | ICD-10-CM | POA: Insufficient documentation

## 2017-04-22 DIAGNOSIS — S60229A Contusion of unspecified hand, initial encounter: Secondary | ICD-10-CM | POA: Insufficient documentation

## 2017-04-22 DIAGNOSIS — J441 Chronic obstructive pulmonary disease with (acute) exacerbation: Secondary | ICD-10-CM

## 2017-04-22 DIAGNOSIS — J0101 Acute recurrent maxillary sinusitis: Secondary | ICD-10-CM

## 2017-04-22 DIAGNOSIS — R0902 Hypoxemia: Secondary | ICD-10-CM | POA: Insufficient documentation

## 2017-04-22 DIAGNOSIS — H6593 Unspecified nonsuppurative otitis media, bilateral: Secondary | ICD-10-CM

## 2017-04-22 MED ORDER — FLUTICASONE PROPIONATE 50 MCG/ACT NA SUSP: 1.0000 | Freq: Two times a day (BID) | NASAL | 0 refills | Status: DC

## 2017-04-22 MED ORDER — DOXYCYCLINE HYCLATE 100 MG PO TABS: 100.0000 mg | ORAL_TABLET | Freq: Two times a day (BID) | ORAL | 0 refills | Status: DC

## 2017-04-22 MED ORDER — PREDNISONE 20 MG PO TABS: 40.0000 mg | ORAL_TABLET | Freq: Every day | ORAL | 0 refills | Status: AC

## 2017-04-22 MED ORDER — SALINE SPRAY 0.65 % NA SOLN: 2.0000 | NASAL | 0 refills | Status: DC

## 2017-04-22 MED ORDER — BENZONATATE 200 MG PO CAPS: 200.0000 mg | ORAL_CAPSULE | Freq: Three times a day (TID) | ORAL | 0 refills | Status: AC | PRN

## 2017-04-22 MED ORDER — ALBUTEROL SULFATE HFA 108 (90 BASE) MCG/ACT IN AERS: 1.0000 | INHALATION_SPRAY | RESPIRATORY_TRACT | 0 refills | Status: DC | PRN

## 2017-04-22 NOTE — Progress Notes (Signed)
Subjective:    Patient ID: Cory Espinoza, male    DOB: 1971-01-31, 46 y.o.   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  Had sweats, chills, fever since Tuesday.  PMHx COPD, asthma.  Last sinus infection Jan 2018 zpak cleared it up.  Using his inhaled steroids but hasn't required rescue inhaler.  Productive yellow cough, post nasal drip, sore throat, headache, rhinitis for a couple days.  Denied hemoptysis, wheezing, nausea, vomiting      Review of Systems  Constitutional: Positive for chills, diaphoresis and fever. Negative for activity change, appetite change, fatigue and unexpected weight change.  HENT: Positive for congestion, postnasal drip, rhinorrhea, sinus pain, sinus pressure and sore throat. Negative for dental problem, drooling, ear discharge, ear pain, facial swelling, hearing loss, mouth sores, nosebleeds, sneezing, tinnitus, trouble swallowing and voice change.   Eyes: Negative for photophobia, pain, discharge, redness, itching and visual disturbance.  Respiratory: Positive for cough and chest tightness. Negative for choking, shortness of breath, wheezing and stridor.   Cardiovascular: Negative for chest pain, palpitations and leg swelling.  Gastrointestinal: Negative for abdominal distention, abdominal pain, blood in stool, constipation, diarrhea, nausea and vomiting.  Endocrine: Negative for cold intolerance and heat intolerance.  Genitourinary: Negative for difficulty urinating and dysuria.  Musculoskeletal: Negative for arthralgias, back pain, gait problem, joint swelling, myalgias, neck pain and neck stiffness.  Skin: Negative for color change, pallor, rash and wound.  Allergic/Immunologic: Positive for environmental allergies. Negative for food allergies and immunocompromised state.  Neurological: Negative for dizziness, tremors, seizures, syncope, facial asymmetry, speech  difficulty, weakness, light-headedness, numbness and headaches.  Hematological: Negative for adenopathy. Does not bruise/bleed easily.  Psychiatric/Behavioral: Positive for sleep disturbance. Negative for agitation, behavioral problems and confusion.       Objective:   Physical Exam  Constitutional: He is oriented to person, place, and time. Vital signs are normal. He appears well-developed and well-nourished. He is cooperative. He appears ill. No distress.  HENT:  Head: Normocephalic and atraumatic.  Right Ear: Hearing, external ear and ear canal normal. A middle ear effusion is present.  Left Ear: Hearing, external ear and ear canal normal. A middle ear effusion is present.  Nose: Mucosal edema and rhinorrhea present. No nose lacerations, sinus tenderness, nasal deformity, septal deviation or nasal septal hematoma. No epistaxis.  No foreign bodies. Right sinus exhibits maxillary sinus tenderness and frontal sinus tenderness. Left sinus exhibits maxillary sinus tenderness and frontal sinus tenderness.  Mouth/Throat: Uvula is midline and mucous membranes are normal. Mucous membranes are not pale, not dry and not cyanotic. He does not have dentures. No oral lesions. No trismus in the jaw. Normal dentition. No dental abscesses, uvula swelling, lacerations or dental caries. Posterior oropharyngeal edema and posterior oropharyngeal erythema present. No oropharyngeal exudate or tonsillar abscesses.  Bilateral allergic shiners; bilateral TMs air fluid level clear; bilateral nasal turbinates edema/erythema clear yellow discharge; cobblestoning posterior pharynx; maxillary greater than frontal bilateral sinus tenderness; maxillary exquisitely tender  Eyes: Conjunctivae, EOM and lids are normal. Pupils are equal, round, and reactive to light. Right eye exhibits no discharge. Left eye exhibits no discharge. No scleral icterus.  Neck: Trachea normal and normal range of motion. Neck supple. No tracheal  deviation present.  Hoarse voice  Cardiovascular: Normal rate, regular rhythm, normal heart sounds and intact distal pulses.   Pulses:      Radial pulses are 2+ on the right side, and  2+ on the left side.  Pulmonary/Chest: Effort normal and breath sounds normal. No accessory muscle usage or stridor. No respiratory distress. He has no decreased breath sounds. He has no wheezes. He has no rhonchi. He has no rales. He exhibits no tenderness.  Harsh nonproductive repetitive cough in exam room; able to speak full sentences between coughing episodes  Abdominal: Soft. He exhibits no distension. There is no guarding.  Musculoskeletal: Normal range of motion. He exhibits no edema, tenderness or deformity.       Right shoulder: Normal.       Left shoulder: Normal.       Right hip: Normal.       Left hip: Normal.       Right knee: Normal.       Left knee: Normal.       Cervical back: Normal.       Thoracic back: Normal.       Lumbar back: Normal.       Right hand: Normal.       Left hand: Normal.  Lymphadenopathy:       Head (right side): No submental, no submandibular, no tonsillar, no preauricular, no posterior auricular and no occipital adenopathy present.       Head (left side): No submental, no submandibular, no tonsillar, no preauricular, no posterior auricular and no occipital adenopathy present.    He has no cervical adenopathy.       Right cervical: No superficial cervical, no deep cervical and no posterior cervical adenopathy present.      Left cervical: No superficial cervical, no deep cervical and no posterior cervical adenopathy present.  Tender to palpation over eustachian tubes bilaterally  Neurological: He is alert and oriented to person, place, and time. He has normal strength. He is not disoriented. He displays no atrophy and no tremor. No cranial nerve deficit or sensory deficit. He exhibits normal muscle tone. He displays no seizure activity. Coordination and gait normal. GCS eye  subscore is 4. GCS verbal subscore is 5. GCS motor subscore is 6.  On/off exam table without difficulty; gait sure and steady in hall  Skin: Skin is warm, dry and intact. No abrasion, no bruising, no burn, no ecchymosis, no laceration, no lesion, no petechiae and no rash noted. He is not diaphoretic. No cyanosis or erythema. No pallor. Nails show no clubbing.  Psychiatric: He has a normal mood and affect. His speech is normal and behavior is normal. Judgment and thought content normal. Cognition and memory are normal.  Nursing note and vitals reviewed.   1655 discussed with patient rapid flu testing negative A&B  Patient verbalized understanding information and had no further questions at this time.      Assessment & Plan:  A-COPD exacerbation acute bronchitis, acute recurrent maxillary sinusitis, bilateral otitis media effusion  P-work excuse x 72 hours stay home until fever free 24 hours off tylenol (05//18/18-05/20/18) Prednisone 40mg  po daily with breakfast x 5 days #10 RF0, tessalon pearles 200mg  po TID prn cough #20 RF0; albuterol MDI 1-2 puffs 90mcg q4-6h prn protracted coughing/wheezing/chest tightness #1 RF0; honey with lemon, hydrate, cough lozenges po q2h prn cough OTC  Start doxycycline if no improvement with 48 hours steroids/albuterol 100mg  po BID x 10 days #20 RF0  Wear protective clothing/sunscreen as increased risk sunburn.  Continue inhaled oral steroids as prescribed by PCM.  Bronchitis simple, community acquired, may have started as viral (probably respiratory syncytial, parainfluenza, influenza, or adenovirus), but now evidence of acute  purulent bronchitis with resultant bronchial edema and mucus formation.  Viruses are the most common cause of bronchial inflammation in otherwise healthy adults with acute bronchitis.  The appearance of sputum is not predictive of whether a bacterial infection is present.  Purulent sputum is most often caused by viral infections.  There are a small  portion of those caused by non-viral agents being Mycoplamsa pneumonia.  Microscopic examination or C&S of sputum in the healthy adult with acute bronchitis is generally not helpful (usually negative or normal respiratory flora) other considerations being cough from upper respiratory tract infections, sinusitis or allergic syndromes (mild asthma or viral pneumonia).  Differential Diagnosis:  reactive airway disease (asthma, allergic aspergillosis (eosinophilia), chronic bronchitis, respiratory infection (Sinusitis, Common cold, pneumonia), congestive heart failure, reflux esophagitis, bronchogenic tumor, aspiration syndromes and/or exposure irritants/tobacco smoke.  In this case, there is no evidence of any invasive bacterial illness.  Most likely viral etiology so will hold on antibiotic treatment.  Advise supportive care with rest, encourage fluids, good hygiene and watch for any worsening symptoms.  If they were to develop:  come back to the office or go to the emergency room if after hours. Without high fever, severe dyspnea, lack of physical findings or other risk factors, I will hold on a chest radiograph and CBC at this time. I discussed that approximately 50% of patients with acute bronchitis have a cough that lasts up to three weeks, and 25% for over a month.  Tylenol, one to two tablets every four hours as needed for fever or myalgias.   No aspirin.  Patient instructed to follow up in one week or sooner if symptoms worsen. exitcare handout on bronchitis, copd exacerbation given to patient.  Patient verbalized agreement and understanding of treatment plan and had no further questions at this time.  P2:  hand washing and cover cough  start flonase 1 spray each nostril BID #1 RF0, saline 2 sprays each nostril q2h prn congestion OTC.  If no improvement with 48 hours of saline and flonase use start doxycycline 100mg  po BID x 10 days. Shower prior to bed.  Hydrate.  Wear sunscreen/protective clothing as  increased risk sunburn with doxycycline.  Rx given.  No evidence of systemic bacterial infection, non toxic and well hydrated.  I do not see where any further testing or imaging is necessary at this time.   I will suggest supportive care, rest, good hygiene and encourage the patient to take adequate fluids.  The patient is to return to clinic or EMERGENCY ROOM if symptoms worsen or change significantly.  Exitcare handout on sinusitis given to patient.  Patient verbalized agreement and understanding of treatment plan and had no further questions at this time.   P2:  Hand washing and cover cough  Supportive treatment.   No evidence of invasive bacterial infection, non toxic and well hydrated.  This is most likely self limiting viral infection.  I do not see where any further testing or imaging is necessary at this time.   I will suggest supportive care, rest, good hygiene and encourage the patient to take adequate fluids.  The patient is to return to clinic or EMERGENCY ROOM if symptoms worsen or change significantly e.g. ear pain, fever, purulent discharge from ears or bleeding.  Exitcare handout on otitis media with effusion given to patient.  Patient verbalized agreement and understanding of treatment plan.

## 2017-04-22 NOTE — Patient Instructions (Addendum)
Start prednisone 40mg  by mouth daily with breakfast x 5 days Continue tylenol 1000mg  by mouth every 6 hours as needed for fever/pain Tessalon pearles 200mg  by mouth every 8 hours as needed for cough Albuterol MDI 1-2 puffs protracted cough/chest tightness/wheezing every 4-6 hours Honey with lemon/cough lozenges for cough as needed every 2 hours Fluticasone 1 spray each nostril twice a day Nasal saline 2 sprays each nostril as needed for congestion every 2 hours while awake If no improvement with above regimen start doxycycline 100mg  by mouth twice a day for 10 days Wear protective clothing/sunscreen as increased risk sunburn Shower before bed Hydrate Do not sleep with windows open  How to Use a Metered Dose Inhaler A metered dose inhaler is a handheld device for taking medicine that must be breathed into the lungs (inhaled). The device can be used to deliver a variety of inhaled medicines, including:  Quick relief or rescue medicines, such as bronchodilators.  Controller medicines, such as corticosteroids. The medicine is delivered by pushing down on a metal canister to release a preset amount of spray and medicine. Each device contains the amount of medicine that is needed for a preset number of uses (inhalations). Your health care provider may recommend that you use a spacer with your inhaler to help you take the medicine more effectively. A spacer is a plastic tube with a mouthpiece on one end and an opening that connects to the inhaler on the other end. A spacer holds the medicine in a tube for a short time, which allows you to inhale more medicine. What are the risks? If you do not use your inhaler correctly, medicine might not reach your lungs to help you breathe. Inhaler medicine can cause side effects, such as:  Mouth or throat infection.  Cough.  Hoarseness.  Headache.  Nausea and vomiting.  Lung infection (pneumonia) in people who have a lung condition called COPD. How  to use a metered dose inhaler without a spacer 1. Remove the cap from the inhaler. 2. If you are using the inhaler for the first time, shake it for 5 seconds, turn it away from your face, then release 4 puffs into the air. This is called priming. 3. Shake the inhaler for 5 seconds. 4. Position the inhaler so the top of the canister faces up. 5. Put your index finger on the top of the medicine canister. Support the bottom of the inhaler with your thumb. 6. Breathe out normally and as completely as possible, away from the inhaler. 7. Either place the inhaler between your teeth and close your lips tightly around the mouthpiece, or hold the inhaler 1-2 inches (2.5-5 cm) away from your open mouth. Keep your tongue down out of the way. If you are unsure which technique to use, ask your health care provider. 8. Press the canister down with your index finger to release the medicine, then inhale deeply and slowly through your mouth (not your nose) until your lungs are completely filled. Inhaling should take 4-6 seconds. 9. Hold the medicine in your lungs for 5-10 seconds (10 seconds is best). This helps the medicine get into the small airways of your lungs. 10. With your lips in a tight circle (pursed), breathe out slowly. 11. Repeat steps 3-10 until you have taken the number of puffs that your health care provider directed. Wait about 1 minute between puffs or as directed. 12. Put the cap on the inhaler. 13. If you are using a steroid inhaler, rinse your mouth  with water, gargle, and spit out the water. Do not swallow the water. How to use a metered dose inhaler with a spacer 1. Remove the cap from the inhaler. 2. If you are using the inhaler for the first time, shake it for 5 seconds, turn it away from your face, then release 4 puffs into the air. This is called priming. 3. Shake the inhaler for 5 seconds. 4. Place the open end of the spacer onto the inhaler mouthpiece. 5. Position the inhaler so the top  of the canister faces up and the spacer mouthpiece faces you. 6. Put your index finger on the top of the medicine canister. Support the bottom of the inhaler and the spacer with your thumb. 7. Breathe out normally and as completely as possible, away from the spacer. 8. Place the spacer between your teeth and close your lips tightly around it. Keep your tongue down out of the way. 9. Press the canister down with your index finger to release the medicine, then inhale deeply and slowly through your mouth (not your nose) until your lungs are completely filled. Inhaling should take 4-6 seconds. 10. Hold the medicine in your lungs for 5-10 seconds (10 seconds is best). This helps the medicine get into the small airways of your lungs. 11. With your lips in a tight circle (pursed), breathe out slowly. 12. Repeat steps 3-11 until you have taken the number of puffs that your health care provider directed. Wait about 1 minute between puffs or as directed. 13. Remove the spacer from the inhaler and put the cap on the inhaler. 14. If you are using a steroid inhaler, rinse your mouth with water, gargle, and spit out the water. Do not swallow the water. Follow these instructions at home:  Take your inhaled medicine only as told by your health care provider. Do not use the inhaler more than directed by your health care provider.  Keep all follow-up visits as told by your health care provider. This is important.  If your inhaler has a counter, you can check it to determine how full your inhaler is. If your inhaler does not have a counter, ask your health care provider when you will need to refill your inhaler and write the refill date on a calendar or on your inhaler canister. Note that you cannot know when an inhaler is empty by shaking it.  Follow directions on the package insert for care and cleaning of your inhaler and spacer. Contact a health care provider if:  Symptoms are only partially relieved with your  inhaler.  You are having trouble using your inhaler.  You have an increase in phlegm.  You have headaches. Get help right away if:  You feel little or no relief after using your inhaler.  You have dizziness.  You have a fast heart rate.  You have chills or a fever.  You have night sweats.  There is blood in your phlegm. Summary  A metered dose inhaler is a handheld device for taking medicine that must be breathed into the lungs (inhaled).  The medicine is delivered by pushing down on a metal canister to release a preset amount of spray and medicine.  Each device contains the amount of medicine that is needed for a preset number of uses (inhalations). This information is not intended to replace advice given to you by your health care provider. Make sure you discuss any questions you have with your health care provider. Document Released: 11/23/2005 Document Revised:  10/13/2016 Document Reviewed: 10/13/2016 Elsevier Interactive Patient Education  2017 [employer] Sinus Rinse What is a sinus rinse? A sinus rinse is a simple home treatment that is used to rinse your sinuses with a sterile mixture of salt and water (saline solution). Sinuses are air-filled spaces in your skull behind the bones of your face and forehead that open into your nasal cavity. You will use the following:  Saline solution.  Neti pot or spray bottle. This releases the saline solution into your nose and through your sinuses. Neti pots and spray bottles can be purchased at [employer], a health food store, or online. When would I do a sinus rinse? A sinus rinse can help to clear mucus, dirt, dust, or pollen from the nasal cavity. You may do a sinus rinse when you have a cold, a virus, nasal allergy symptoms, a sinus infection, or stuffiness in the nose or sinuses. If you are considering a sinus rinse:  Ask your child's health care provider before performing a sinus rinse on your child.  Do not  do a sinus rinse if you have had ear or nasal surgery, ear infection, or blocked ears. How do I do a sinus rinse?  Wash your hands.  Disinfect your device according to the directions provided and then dry it.  Use the solution that comes with your device or one that is sold separately in stores. Follow the mixing directions on the package.  Fill your device with the amount of saline solution as directed by the device instructions.  Stand over a sink and tilt your head sideways over the sink.  Place the spout of the device in your upper nostril (the one closer to the ceiling).  Gently pour or squeeze the saline solution into the nasal cavity. The liquid should drain to the lower nostril if you are not overly congested.  Gently blow your nose. Blowing too hard may cause ear pain.  Repeat in the other nostril.  Clean and rinse your device with clean water and then air-dry it. Are there risks of a sinus rinse? Sinus rinse is generally very safe and effective. However, there are a few risks, which include:  A burning sensation in the sinuses. This may happen if you do not make the saline solution as directed. Make sure to follow all directions when making the saline solution.  Infection from contaminated water. This is rare, but possible.  Nasal irritation. This information is not intended to replace advice given to you by your health care provider. Make sure you discuss any questions you have with your health care provider. Document Released: 06/20/2014 Document Revised: 10/20/2016 Document Reviewed: 04/10/2014 Else[employer] Interactive Patient Education  2017 [employer] Allergic Rhinitis Allergic rhinitis is when the mucous membranes in the nose respond to allergens. Allergens are particles in the air that cause your body to have an allergic reaction. This causes you to release allergic antibodies. Through a chain of events, these eventually cause you to release histamine into the blood  stream. Although meant to protect the body, it is this release of histamine that causes your discomfort, such as frequent sneezing, congestion, and an itchy, runny nose. What are the causes? Seasonal allergic rhinitis (hay fever) is caused by pollen allergens that may come from grasses, trees, and weeds. Year-round allergic rhinitis (perennial allergic rhinitis) is caused by allergens such as house dust mites, pet dander, and mold spores. What are the signs or symptoms?  Nasal stuffiness (congestion).  Itchy, runny  nose with sneezing and tearing of the eyes. How is this diagnosed? Your health care provider can help you determine the allergen or allergens that trigger your symptoms. If you and your health care provider are unable to determine the allergen, skin or blood testing may be used. Your health care provider will diagnose your condition after taking your health history and performing a physical exam. Your health care provider may assess you for other related conditions, such as asthma, pink eye, or an ear infection. How is this treated? Allergic rhinitis does not have a cure, but it can be controlled by:  Medicines that block allergy symptoms. These may include allergy shots, nasal sprays, and oral antihistamines.  Avoiding the allergen. Hay fever may often be treated with antihistamines in pill or nasal spray forms. Antihistamines block the effects of histamine. There are over-the-counter medicines that may help with nasal congestion and swelling around the eyes. Check with your health care provider before taking or giving this medicine. If avoiding the allergen or the medicine prescribed do not work, there are many new medicines your health care provider can prescribe. Stronger medicine may be used if initial measures are ineffective. Desensitizing injections can be used if medicine and avoidance does not work. Desensitization is when a patient is given ongoing shots until the body becomes  less sensitive to the allergen. Make sure you follow up with your health care provider if problems continue. Follow these instructions at home: It is not possible to completely avoid allergens, but you can reduce your symptoms by taking steps to limit your exposure to them. It helps to know exactly what you are allergic to so that you can avoid your specific triggers. Contact a health care provider if:  You have a fever.  You develop a cough that does not stop easily (persistent).  You have shortness of breath.  You start wheezing.  Symptoms interfere with normal daily activities. This information is not intended to replace advice given to you by your health care provider. Make sure you discuss any questions you have with your health care provider. Document Released: 08/18/2001 Document Revised: 07/24/2016 Document Reviewed: 07/31/2013 Elsevier Interactive Patient Education  2017 [employer] Acute Bronchitis, Adult Acute bronchitis is sudden (acute) swelling of the air tubes (bronchi) in the lungs. Acute bronchitis causes these tubes to fill with mucus, which can make it hard to breathe. It can also cause coughing or wheezing. In adults, acute bronchitis usually goes away within 2 weeks. A cough caused by bronchitis may last up to 3 weeks. Smoking, allergies, and asthma can make the condition worse. Repeated episodes of bronchitis may cause further lung problems, such as chronic obstructive pulmonary disease (COPD). What are the causes? This condition can be caused by germs and by substances that irritate the lungs, including:  Cold and flu viruses. This condition is most often caused by the same virus that causes a cold.  Bacteria.  Exposure to tobacco smoke, dust, fumes, and air pollution. What increases the risk? This condition is more likely to develop in people who:  Have close contact with someone with acute bronchitis.  Are exposed to lung irritants, such as tobacco smoke,  dust, fumes, and vapors.  Have a weak immune system.  Have a respiratory condition such as asthma. What are the signs or symptoms? Symptoms of this condition include:  A cough.  Coughing up clear, yellow, or green mucus.  Wheezing.  Chest congestion.  Shortness of breath.  A fever.  Body aches.  Chills.  A sore throat. How is this diagnosed? This condition is usually diagnosed with a physical exam. During the exam, your health care provider may order tests, such as chest X-rays, to rule out other conditions. He or she may also:  Test a sample of your mucus for bacterial infection.  Check the level of oxygen in your blood. This is done to check for pneumonia.  Do a chest X-ray or lung function testing to rule out pneumonia and other conditions.  Perform blood tests. Your health care provider will also ask about your symptoms and medical history. How is this treated? Most cases of acute bronchitis clear up over time without treatment. Your health care provider may recommend:  Drinking more fluids. Drinking more makes your mucus thinner, which may make it easier to breathe.  Taking a medicine for a fever or cough.  Taking an antibiotic medicine.  Using an inhaler to help improve shortness of breath and to control a cough.  Using a cool mist vaporizer or humidifier to make it easier to breathe. Follow these instructions at home: Medicines   Take over-the-counter and prescription medicines only as told by your health care provider.  If you were prescribed an antibiotic, take it as told by your health care provider. Do not stop taking the antibiotic even if you start to feel better. General instructions   Get plenty of rest.  Drink enough fluids to keep your urine clear or pale yellow.  Avoid smoking and secondhand smoke. Exposure to cigarette smoke or irritating chemicals will make bronchitis worse. If you smoke and you need help quitting, ask your health care  provider. Quitting smoking will help your lungs heal faster.  Use an inhaler, cool mist vaporizer, or humidifier as told by your health care provider.  Keep all follow-up visits as told by your health care provider. This is important. How is this prevented? To lower your risk of getting this condition again:  Wash your hands often with soap and water. If soap and water are not available, use hand sanitizer.  Avoid contact with people who have cold symptoms.  Try not to touch your hands to your mouth, nose, or eyes.  Make sure to get the flu shot every year. Contact a health care provider if:  Your symptoms do not improve in 2 weeks of treatment. Get help right away if:  You cough up blood.  You have chest pain.  You have severe shortness of breath.  You become dehydrated.  You faint or keep feeling like you are going to faint.  You keep vomiting.  You have a severe headache.  Your fever or chills gets worse. This information is not intended to replace advice given to you by your health care provider. Make sure you discuss any questions you have with your health care provider. Document Released: 12/31/2004 Document Revised: 06/17/2016 Document Reviewed: 05/13/2016 Elsevier Interactive Patient Education  2017 [employer] Sinusitis, Adult Sinusitis is soreness and inflammation of your sinuses. Sinuses are hollow spaces in the bones around your face. Your sinuses are located:  Around your eyes.  In the middle of your forehead.  Behind your nose.  In your cheekbones. Your sinuses and nasal passages are lined with a stringy fluid (mucus). Mucus normally drains out of your sinuses. When your nasal tissues become inflamed or swollen, the mucus can become trapped or blocked so air cannot flow through your sinuses. This allows bacteria, viruses, and funguses to grow, which  leads to infection. Sinusitis can develop quickly and last for 7?10 days (acute) or for more than 12  weeks (chronic). Sinusitis often develops after a cold. What are the causes? This condition is caused by anything that creates swelling in the sinuses or stops mucus from draining, including:  Allergies.  Asthma.  Bacterial or viral infection.  Abnormally shaped bones between the nasal passages.  Nasal growths that contain mucus (nasal polyps).  Narrow sinus openings.  Pollutants, such as chemicals or irritants in the air.  A foreign object stuck in the nose.  A fungal infection. This is rare. What increases the risk? The following factors may make you more likely to develop this condition:  Having allergies or asthma.  Having had a recent cold or respiratory tract infection.  Having structural deformities or blockages in your nose or sinuses.  Having a weak immune system.  Doing a lot of swimming or diving.  Overusing nasal sprays.  Smoking. What are the signs or symptoms? The main symptoms of this condition are pain and a feeling of pressure around the affected sinuses. Other symptoms include:  Upper toothache.  Earache.  Headache.  Bad breath.  Decreased sense of smell and taste.  A cough that may get worse at night.  Fatigue.  Fever.  Thick drainage from your nose. The drainage is often green and it may contain pus (purulent).  Stuffy nose or congestion.  Postnasal drip. This is when extra mucus collects in the throat or back of the nose.  Swelling and warmth over the affected sinuses.  Sore throat.  Sensitivity to light. How is this diagnosed? This condition is diagnosed based on symptoms, a medical history, and a physical exam. To find out if your condition is acute or chronic, your health care provider may:  Look in your nose for signs of nasal polyps.  Tap over the affected sinus to check for signs of infection.  View the inside of your sinuses using an imaging device that has a light attached (endoscope). If your health care provider  suspects that you have chronic sinusitis, you may also:  Be tested for allergies.  Have a sample of mucus taken from your nose (nasal culture) and checked for bacteria.  Have a mucus sample examined to see if your sinusitis is related to an allergy. If your sinusitis does not respond to treatment and it lasts longer than 8 weeks, you may have an MRI or CT scan to check your sinuses. These scans also help to determine how severe your infection is. In rare cases, a bone biopsy may be done to rule out more serious types of fungal sinus disease. How is this treated? Treatment for sinusitis depends on the cause and whether your condition is chronic or acute. If a virus is causing your sinusitis, your symptoms will go away on their own within 10 days. You may be given medicines to relieve your symptoms, including:  Topical nasal decongestants. They shrink swollen nasal passages and let mucus drain from your sinuses.  Antihistamines. These drugs block inflammation that is triggered by allergies. This can help to ease swelling in your nose and sinuses.  Topical nasal corticosteroids. These are nasal sprays that ease inflammation and swelling in your nose and sinuses.  Nasal saline washes. These rinses can help to get rid of thick mucus in your nose. If your condition is caused by bacteria, you will be given an antibiotic medicine. If your condition is caused by a fungus, you will  be given an antifungal medicine. Surgery may be needed to correct underlying conditions, such as narrow nasal passages. Surgery may also be needed to remove polyps. Follow these instructions at home: Medicines   Take, use, or apply over-the-counter and prescription medicines only as told by your health care provider. These may include nasal sprays.  If you were prescribed an antibiotic medicine, take it as told by your health care provider. Do not stop taking the antibiotic even if you start to feel better. Hydrate and  Humidify   Drink enough water to keep your urine clear or pale yellow. Staying hydrated will help to thin your mucus.  Use a cool mist humidifier to keep the humidity level in your home above 50%.  Inhale steam for 10-15 minutes, 3-4 times a day or as told by your health care provider. You can do this in the bathroom while a hot shower is running.  Limit your exposure to cool or dry air. Rest   Rest as much as possible.  Sleep with your head raised (elevated).  Make sure to get enough sleep each night. General instructions   Apply a warm, moist washcloth to your face 3-4 times a day or as told by your health care provider. This will help with discomfort.  Wash your hands often with soap and water to reduce your exposure to viruses and other germs. If soap and water are not available, use hand sanitizer.  Do not smoke. Avoid being around people who are smoking (secondhand smoke).  Keep all follow-up visits as told by your health care provider. This is important. Contact a health care provider if:  You have a fever.  Your symptoms get worse.  Your symptoms do not improve within 10 days. Get help right away if:  You have a severe headache.  You have persistent vomiting.  You have pain or swelling around your face or eyes.  You have vision problems.  You develop confusion.  Your neck is stiff.  You have trouble breathing. This information is not intended to replace advice given to you by your health care provider. Make sure you discuss any questions you have with your health care provider. Document Released: 11/23/2005 Document Revised: 07/19/2016 Document Reviewed: 09/18/2015 [employer] Interactive Patient Education  2017 [employer]  Chronic Obstructive Pulmonary Disease Exacerbation Chronic obstructive pulmonary disease (COPD) is a common lung condition in which airflow from the lungs is limited. COPD is a general term that can be used to describe many different  lung problems that limit airflow, including chronic bronchitis and emphysema. COPD exacerbations are episodes when breathing symptoms become much worse and require extra treatment. Without treatment, COPD exacerbations can be life threatening, and frequent COPD exacerbations can cause further damage to your lungs. What are the causes?  Respiratory infections.  Exposure to smoke.  Exposure to air pollution, chemical fumes, or dust. Sometimes there is no apparent cause or trigger. What increases the risk?  Smoking cigarettes.  Older age.  Frequent prior COPD exacerbations. What are the signs or symptoms?  Increased coughing.  Increased thick spit (sputum) production.  Increased wheezing.  Increased shortness of breath.  Rapid breathing.  Chest tightness. How is this diagnosed? Your medical history, a physical exam, and tests will help your health care provider make a diagnosis. Tests may include:  A chest X-ray.  Basic lab tests.  Sputum testing.  An arterial blood gas test. How is this treated? Depending on the severity of your COPD exacerbation, you may  need to be admitted to a hospital for treatment. Some of the treatments commonly used to treat COPD exacerbations are:  Antibiotic medicines.  Bronchodilators. These are drugs that expand the air passages. They may be given with an inhaler or nebulizer. Spacer devices may be needed to help improve drug delivery.  Corticosteroid medicines.  Supplemental oxygen therapy.  Airway clearing techniques, such as noninvasive ventilation (NIV) and positive expiratory pressure (PEP). These provide respiratory support through a mask or other noninvasive device. Follow these instructions at home:  Do not smoke. Quitting smoking is very important to prevent COPD from getting worse and exacerbations from happening as often.  Avoid exposure to all substances that irritate the airway, especially to tobacco smoke.  If you were  prescribed an antibiotic medicine, finish it all even if you start to feel better.  Take all medicines as directed by your health care provider.It is important to use correct technique with inhaled medicines.  Drink enough fluids to keep your urine clear or pale yellow (unless you have a medical condition that requires fluid restriction).  Use a cool mist vaporizer. This makes it easier to clear your chest when you cough.  If you have a home nebulizer and oxygen, continue to use them as directed.  Maintain all necessary vaccinations to prevent infections.  Exercise regularly.  Eat a healthy diet.  Keep all follow-up appointments as directed by your health care provider. Get help right away if:  You have worsening shortness of breath.  You have trouble talking.  You have severe chest pain.  You have blood in your sputum.  You have a fever.  You have weakness, vomit repeatedly, or faint.  You feel confused.  You continue to get worse. This information is not intended to replace advice given to you by your health care provider. Make sure you discuss any questions you have with your health care provider. Document Released: 09/20/2007 Document Revised: 04/30/2016 Document Reviewed: 07/28/2013 Elsevier Interactive Patient Education  2017 [employer]

## 2017-04-23 NOTE — Addendum Note (Signed)
Addended by: Catha BrowEACON, MONIQUE T on: 04/23/2017 02:06 PM   Modules accepted: Orders

## 2017-05-06 ENCOUNTER — Ambulatory Visit (INDEPENDENT_AMBULATORY_CARE_PROVIDER_SITE_OTHER): Payer: BLUE CROSS/BLUE SHIELD | Admitting: Family Medicine

## 2017-05-06 ENCOUNTER — Encounter: Payer: Self-pay | Admitting: Family Medicine

## 2017-05-06 DIAGNOSIS — F419 Anxiety disorder, unspecified: Secondary | ICD-10-CM | POA: Diagnosis not present

## 2017-05-06 DIAGNOSIS — E7800 Pure hypercholesterolemia, unspecified: Secondary | ICD-10-CM

## 2017-05-06 DIAGNOSIS — I2510 Atherosclerotic heart disease of native coronary artery without angina pectoris: Secondary | ICD-10-CM

## 2017-05-06 DIAGNOSIS — K219 Gastro-esophageal reflux disease without esophagitis: Secondary | ICD-10-CM | POA: Diagnosis not present

## 2017-05-06 MED ORDER — ALPRAZOLAM 1 MG PO TABS: 1.0000 mg | ORAL_TABLET | Freq: Four times a day (QID) | ORAL | 2 refills | Status: DC | PRN

## 2017-05-06 MED ORDER — CLOPIDOGREL BISULFATE 75 MG PO TABS: 75.0000 mg | ORAL_TABLET | Freq: Every day | ORAL | 1 refills | Status: DC

## 2017-05-06 MED ORDER — PANTOPRAZOLE SODIUM 40 MG PO TBEC: 40.0000 mg | DELAYED_RELEASE_TABLET | Freq: Every day | ORAL | 0 refills | Status: DC

## 2017-05-06 MED ORDER — ATORVASTATIN CALCIUM 40 MG PO TABS: 40.0000 mg | ORAL_TABLET | Freq: Every day | ORAL | 1 refills | Status: DC

## 2017-05-06 NOTE — Progress Notes (Signed)
Name: Cory Espinoza   MRN: [id number]    DOB: May 10, 1971   Date:05/06/2017       Progress Note  Subjective  Chief Complaint  Chief Complaint  Patient presents with  . Follow-up    3 mo  . Medication Refill    Anxiety  Presents for follow-up visit. Symptoms include muscle tension. Patient reports no chest pain, depressed mood, excessive worry, nausea, nervous/anxious behavior, palpitations, panic or shortness of breath. The severity of symptoms is moderate.   Past treatments include benzodiazephines. The treatment provided significant relief.  Hyperlipidemia  This is a chronic problem. The problem is controlled. Recent lipid tests were reviewed and are normal. Pertinent negatives include no chest pain, focal weakness, leg pain, myalgias or shortness of breath. Current antihyperlipidemic treatment includes statins. Risk factors for coronary artery disease include dyslipidemia, male sex and stress.  Gastroesophageal Reflux  He reports no chest pain, no coughing, no dysphagia, no heartburn, no hoarse voice, no nausea or no sore throat. This is a chronic problem. The problem has been unchanged. Risk factors include obesity. He has tried a PPI for the symptoms.    Past Medical History:  Diagnosis Date  . Allergy   . Anxiety   . COPD (chronic obstructive pulmonary disease) (HCC)   . Coronary artery disease   . Hyperlipidemia   . Hypertension     Past Surgical History:  Procedure Laterality Date  . BACK SURGERY  1996  . CARDIAC CATHETERIZATION Left 04/27/2016   Procedure: Left Heart Cath and Coronary Angiography;  Surgeon: Lamar Blinks, MD;  Location: [facility] INVASIVE CV LAB;  Service: Cardiovascular;  Laterality: Left;  . CARDIAC CATHETERIZATION N/A 04/27/2016   Procedure: Intravascular Pressure Wire/FFR Study;  Surgeon: Alwyn Pea, MD;  Location: [facility] INVASIVE CV LAB;  Service: Cardiovascular;  Laterality: N/A;  . CATHETERIZATION OF PULMONARY ARTERY WITH RETRIEVAL OF FOREIGN  BODY Bilateral 04/07/2011   heart  . KNEE ARTHROSCOPY Bilateral 2004  . LUMBAR DISC SURGERY  1999    Family History  Problem Relation Age of Onset  . Heart disease Mother   . Heart disease Father   . Diabetes Sister   . Hypertension Brother   . Cancer Paternal Grandmother        breast  . Diabetes Paternal Grandmother     Social History   Social History  . Marital status: Married    Spouse name: N/A  . Number of children: N/A  . Years of education: N/A   Occupational History  . Not on file.   Social History Main Topics  . Smoking status: Former Smoker    Packs/day: 2.00    Years: 23.00    Types: Cigarettes    Start date: 10/28/1989    Quit date: 10/28/2012  . Smokeless tobacco: Current User    Types: Snuff  . Alcohol use 0.0 oz/week     Comment: rarely  . Drug use: No  . Sexual activity: Yes    Partners: Female   Other Topics Concern  . Not on file   Social History Narrative  . No narrative on file     Current Outpatient Prescriptions:  .  albuterol (PROVENTIL HFA;VENTOLIN HFA) 108 (90 Base) MCG/ACT inhaler, Inhale 1-2 puffs into the lungs every 4 (four) hours as needed for wheezing or shortness of breath., Disp: 1 Inhaler, Rfl: 0 .  ALPRAZolam (XANAX) 1 MG tablet, Take 1 tablet (1 mg total) by mouth 4 (four) times daily as needed for anxiety.,  Disp: 120 tablet, Rfl: 2 .  atorvastatin (LIPITOR) 40 MG tablet, Take 1 tablet (40 mg total) by mouth daily at 6 PM., Disp: 90 tablet, Rfl: 1 .  B COMPLEX VITAMINS PO, Take by mouth., Disp: , Rfl:  .  budesonide-formoterol (SYMBICORT) 160-4.5 MCG/ACT inhaler, Inhale 2 puffs into the lungs every morning., Disp: 1 Inhaler, Rfl: 5 .  clopidogrel (PLAVIX) 75 MG tablet, Take 1 tablet (75 mg total) by mouth daily., Disp: 90 tablet, Rfl: 1 .  doxycycline (VIBRA-TABS) 100 MG tablet, Take 1 tablet (100 mg total) by mouth 2 (two) times daily., Disp: 20 tablet, Rfl: 0 .  fluticasone (FLONASE) 50 MCG/ACT nasal spray, Place 1 spray  into both nostrils 2 (two) times daily., Disp: 16 g, Rfl: 0 .  isosorbide mononitrate (IMDUR) 30 MG 24 hr tablet, , Disp: , Rfl: 0 .  Lorcaserin HCl (BELVIQ) 10 MG TABS, Take 10 mg by mouth daily., Disp: , Rfl:  .  meloxicam (MOBIC) 15 MG tablet, Take 15 mg by mouth daily., Disp: , Rfl:  .  pantoprazole (PROTONIX) 40 MG tablet, Take 40 mg by mouth daily., Disp: , Rfl:  .  Saw Palmetto 160 MG CAPS, Take by mouth., Disp: , Rfl:  .  sodium chloride (OCEAN) 0.65 % SOLN nasal spray, Place 2 sprays into both nostrils every 2 (two) hours while awake., Disp: , Rfl: 0  Allergies  Allergen Reactions  . Amoxicillin Other (See Comments)  . Aspirin Other (See Comments)    swelling of throat swelling of throat  . Codeine Hives  . Hydrocodone-Acetaminophen Other (See Comments)    itching itching  . Oxycodone-Acetaminophen Nausea And Vomiting  . Oxycodone-Acetaminophen Nausea And Vomiting  . Penicillins Other (See Comments)    unknown unknown     Review of Systems  HENT: Negative for hoarse voice and sore throat.   Respiratory: Negative for cough and shortness of breath.   Cardiovascular: Negative for chest pain and palpitations.  Gastrointestinal: Negative for dysphagia, heartburn and nausea.  Musculoskeletal: Negative for myalgias.  Neurological: Negative for focal weakness.  Psychiatric/Behavioral: The patient is not nervous/anxious.       Objective  Vitals:   05/06/17 1437  BP: 126/74  Pulse: (!) 107  Resp: 18  Temp: 98.1 F (36.7 C)  TempSrc: Oral  SpO2: 94%  Weight: 239 lb 4.8 oz (108.5 kg)  Height: 6\' 2"  (1.88 m)    Physical Exam  Constitutional: He is oriented to person, place, and time and well-developed, well-nourished, and in no distress.  HENT:  Head: Normocephalic and atraumatic.  Cardiovascular: Normal rate, regular rhythm and normal heart sounds.   No murmur heard. Pulmonary/Chest: Effort normal and breath sounds normal. He has no wheezes.  Neurological: He  is alert and oriented to person, place, and time.  Psychiatric: Mood, memory, affect and judgment normal.  Nursing note and vitals reviewed.   Assessment & Plan  1. Anxiety Stable and responsive to alprazolam taken up to 4 times daily when needed, patient compliant with controlled substances agreement. Refills provided - ALPRAZolam (XANAX) 1 MG tablet; Take 1 tablet (1 mg total) by mouth 4 (four) times daily as needed for anxiety.  Dispense: 120 tablet; Refill: 2  2. Atherosclerosis of native coronary artery of native heart without angina pectoris Followed by cardiology, on statin and Plavix - clopidogrel (PLAVIX) 75 MG tablet; Take 1 tablet (75 mg total) by mouth daily.  Dispense: 90 tablet; Refill: 1 - atorvastatin (LIPITOR) 40 MG tablet; Take 1  tablet (40 mg total) by mouth daily at 6 PM.  Dispense: 90 tablet; Refill: 1  3. Pure hypercholesterolemia  - atorvastatin (LIPITOR) 40 MG tablet; Take 1 tablet (40 mg total) by mouth daily at 6 PM.  Dispense: 90 tablet; Refill: 1  4. Gastroesophageal reflux disease, esophagitis presence not specified Symptoms stable on PPI - pantoprazole (PROTONIX) 40 MG tablet; Take 1 tablet (40 mg total) by mouth daily.  Dispense: 90 tablet; Refill: 0   Eron Goble Asad A. Faylene KurtzShah [facility] 05/06/2017 3:06 PM

## 2017-05-10 ENCOUNTER — Ambulatory Visit: Payer: BLUE CROSS/BLUE SHIELD | Admitting: Family Medicine

## 2017-06-30 ENCOUNTER — Encounter: Payer: BLUE CROSS/BLUE SHIELD | Admitting: Family Medicine

## 2017-07-06 ENCOUNTER — Encounter: Payer: Self-pay | Admitting: Physician Assistant

## 2017-07-06 ENCOUNTER — Ambulatory Visit: Payer: Self-pay | Admitting: Physician Assistant

## 2017-07-06 VITALS — BP 110/80 | HR 98 | Temp 98.5°F | Resp 16

## 2017-07-06 DIAGNOSIS — R252 Cramp and spasm: Secondary | ICD-10-CM

## 2017-07-06 DIAGNOSIS — M722 Plantar fascial fibromatosis: Secondary | ICD-10-CM

## 2017-07-06 MED ORDER — MELOXICAM 15 MG PO TABS: 15.0000 mg | ORAL_TABLET | Freq: Every day | ORAL | 3 refills | Status: DC

## 2017-07-06 NOTE — Progress Notes (Signed)
S: c/o r foot pain, states his work boots are old, is getting new boots at the end of the week, r foot is hurting down into lateral toes and underneath from heel to ball of foot, had several "charlie horses" recently, is on k+ but has been sweating a lot at work due tot he heat  O: vitals wnl, nad, skin intact, no redness or swelling, tender at plantar aspect along foot and heel, no bony tenderness of foot, no calf tenderness, no redness or swelling of calf, n/v intact  A: leg cramps, plantar fasciitis  P: mobic 1m qd (refill), ice, orthotics, stretches, labs for met b and magnesium

## 2017-07-07 LAB — BASIC METABOLIC PANEL
BUN/Creatinine Ratio: 8 — ABNORMAL LOW (ref 9–20)
BUN: 7 mg/dL (ref 6–24)
CO2: 24 mmol/L (ref 20–29)
Calcium: 9.6 mg/dL (ref 8.7–10.2)
Chloride: 103 mmol/L (ref 96–106)
Creatinine, Ser: 0.83 mg/dL (ref 0.76–1.27)
GFR calc Af Amer: 123 mL/min/{1.73_m2} (ref >59)
GFR calc non Af Amer: 106 mL/min/{1.73_m2} (ref >59)
Glucose: 89 mg/dL (ref 65–99)
Potassium: 4.6 mmol/L (ref 3.5–5.2)
Sodium: 139 mmol/L (ref 134–144)

## 2017-07-07 LAB — MAGNESIUM: Magnesium: 1.9 mg/dL (ref 1.6–2.3)

## 2017-07-13 ENCOUNTER — Ambulatory Visit (INDEPENDENT_AMBULATORY_CARE_PROVIDER_SITE_OTHER): Payer: BLUE CROSS/BLUE SHIELD | Admitting: Podiatry

## 2017-07-13 ENCOUNTER — Encounter: Payer: Self-pay | Admitting: Podiatry

## 2017-07-13 ENCOUNTER — Ambulatory Visit (INDEPENDENT_AMBULATORY_CARE_PROVIDER_SITE_OTHER): Payer: BLUE CROSS/BLUE SHIELD

## 2017-07-13 DIAGNOSIS — R52 Pain, unspecified: Secondary | ICD-10-CM

## 2017-07-13 DIAGNOSIS — S93601A Unspecified sprain of right foot, initial encounter: Secondary | ICD-10-CM

## 2017-07-13 MED ORDER — METHYLPREDNISOLONE 4 MG PO TBPK: ORAL_TABLET | ORAL | 0 refills | Status: DC

## 2017-07-13 NOTE — Progress Notes (Signed)
   Subjective:    Patient ID: Cory Espinoza, male    DOB: 02/09/1971, 46 y.o.   MRN: [id number]  HPI this patient presents to the office with chief complaint of a painful outside border of his right foot.  He says this pain has been present now since last week.  He says he experienced severe throbbing, swelling and burning on his right foot.  He denies any history of trauma or injury to the foot.  No history of arthritis.  He says he took off this weekend and elevated his foot and by Sunday. There was no pain in his right foot.  He then returned to work and started to develop severe pain in the area of his right foot as before.  He states he's having significant pain and discomfort as he walks and bears weight on his right foot. He presents the office today for an evaluation and treatment of his right foot    Review of Systems  Musculoskeletal: Positive for gait problem.       Joint pain       Objective:   Physical Exam GENERAL APPEARANCE: Alert, conversant. Appropriately groomed. No acute distress.  VASCULAR: Pedal pulses are  [facility]pable at  [facility].  Capillary refill time is immediate to all digits,  Normal temperature gradient.  Digital hair growth is present bilateral  NEUROLOGIC: sensation is normal to 5.07 monofilament at 5/5 sites bilateral.  Light touch is intact bilateral, Muscle strength normal.  MUSCULOSKELETAL: acceptable muscle strength, tone and stability bilateral.  Intrinsic muscluature intact bilateral.  Rectus appearance of foot and digits noted bilateral. Palpable pain noted along the shaft of the third, fourth and fifth metatarsals right foot.  There is also pain noted to the cuboid area of the right foot.  Palpable pain noted on the plantar aspect of the fifth metatarsal right foot. There is dorsal swelling noted over the third, fourth and fifth metatarsals right foot.  Minimal increased temperature noted.  DERMATOLOGIC: skin color, texture, and turgor are within  normal limits.  No preulcerative lesions or ulcers  are seen, no interdigital maceration noted.  No open lesions present.  Digital nails are asymptomatic. No drainage noted.         Assessment & Plan:  Foot Sprain right foot.  IE.  X-rays taken reveal no evidence of any bony pathology right foot.  Evaluation of his right foot reveals a possible  cuboid syndrome versus early stress fracture the right foot.  Patient requests that he continue to work.  I therefore treated him conservatively with power step insoles and a Medrol Dosepak.  He is to return to the office in one week for further evaluation of his right foot.  If his pain continues to worsen. I told this patient that I could see him Thursday morning in [place].       Helane GuntherGregory Rilyn Upshaw DPM

## 2017-07-13 NOTE — Progress Notes (Signed)
Patient has been referred to see Dr. Stacie AcresMayer at [facility] on 07/13/2017 at 2pm [place].  I notified the patient and he has accepted the appointment.

## 2017-07-14 ENCOUNTER — Telehealth: Payer: Self-pay | Admitting: Podiatry

## 2017-07-14 MED ORDER — METHYLPREDNISOLONE 4 MG PO TBPK: ORAL_TABLET | ORAL | 0 refills | Status: DC

## 2017-07-14 NOTE — Telephone Encounter (Signed)
Left message informing pt, the medrol dose pack dosing had to be [facility] and [facility] [address] I told pt that although the PowerSteps were OTC, he would have to gradually get use to wearing them. I explained our general instructions were to wear the orthotics as long as they were comfortable, once uncomfortable he should take them out of the shoes and not put back into the shoes until the following day, then wear as long as comfortable and if possible add 15 minutes to his wearing time each day.  I told pt that unless he had the time to sit down and remove the orthotics from the shoes, he should start wearing them at home until he was able to wear them the length of time of his entire work shift.

## 2017-07-14 NOTE — Telephone Encounter (Signed)
I saw Dr. Stacie AcresMayer yesterday and he was supposed to prescribe me a medication but I have not heard from my pharmacy and I got on MyChart but I really could not tell anything. Also, I bought a pair of the powerstep insoles yesterday. I worked until about 9:00 pm last night and when I got home my foot was throbbing in pain and had a tingling sensation. I didn't know if I needed to work my way into wearing them all the time or what I should do. Please call me back at [phone]. Thank you.

## 2017-07-15 ENCOUNTER — Ambulatory Visit: Payer: BLUE CROSS/BLUE SHIELD | Admitting: Podiatry

## 2017-07-15 ENCOUNTER — Ambulatory Visit (INDEPENDENT_AMBULATORY_CARE_PROVIDER_SITE_OTHER): Payer: BLUE CROSS/BLUE SHIELD | Admitting: Podiatry

## 2017-07-15 ENCOUNTER — Encounter: Payer: Self-pay | Admitting: Podiatry

## 2017-07-15 DIAGNOSIS — S93601A Unspecified sprain of right foot, initial encounter: Secondary | ICD-10-CM

## 2017-07-15 NOTE — Progress Notes (Signed)
This patient presents the office today stating his right foot has become increasingly painful.  He says that he presented to work on Tuesday and was in severe pain at the end of work.  He says yesterday the pain was also significant causing swelling to start to extend up his right leg above his ankle.  He has continued pain noted on the outside of his right foot, which is aggravated by activity.  He presents the office for continued evaluation and treatment of this painful foot.  Patient was diagnosed on Tuesday as having a right foot sprain versus a possible stress fracture with significant compensation during gait.  Neurovascular status is noted to be the same but with increasing swelling up the right leg.  Foot sprain right foot   ROV.  I already had discuss with this patient. If conservative treatment was ineffective to treat him with an Radio broadcast assistantUnna boot and a surgical shoe and told him I would give him time off from work.  He presents the office today in an Unna boot was applied. He was told to continue taking his Medrol Dosepak which she just received and started yesterday.  Return to the clinic in one week for further evaluation and treatment and removal of the Unna boot.   Helane GuntherGregory Kalen Ratajczak DPM

## 2017-07-22 ENCOUNTER — Ambulatory Visit (INDEPENDENT_AMBULATORY_CARE_PROVIDER_SITE_OTHER): Payer: BLUE CROSS/BLUE SHIELD | Admitting: Podiatry

## 2017-07-22 ENCOUNTER — Encounter: Payer: Self-pay | Admitting: Podiatry

## 2017-07-22 DIAGNOSIS — S92334D Nondisplaced fracture of third metatarsal bone, right foot, subsequent encounter for fracture with routine healing: Secondary | ICD-10-CM

## 2017-07-22 NOTE — Progress Notes (Signed)
   Subjective:    Patient ID: Cory GriefJoseph D Baskin, male    DOB: 05/31/1971, 46 y.o.   MRN: [id number]  HPI this patient presents to the office For continued evaluation of his painful right foot.  [place] was [place] seen in [place] and had a right foot sprain with possible stress fracture diagnosis.  Patient was treated and allowed to return to work, but told if the problem worsens to return to the office.  He was seen in the office in [place] on 07/15/2017 and treated with an Unna boot to his right foot since his pain worsened.  He says today  he had problems with his Unna boot around his foot right ankle, but upon removal, there was no pain noted on the outside of the foot.  He now says he has pain solely on the top of the foot, which allowed for significant swelling and burning in the top of his right foot.  He returns the office today for continued evaluation and treatment of his painful foot    Review of Systems  Musculoskeletal: Positive for gait problem.       Joint pain       Objective:   Physical Exam GENERAL APPEARANCE: Alert, conversant. Appropriately groomed. No acute distress.  VASCULAR: Pedal pulses are  palpable at  [facility].  Capillary refill time is immediate to all digits,  Normal temperature gradient.  Digital hair growth is present bilateral  NEUROLOGIC: sensation is normal to 5.07 monofilament at 5/5 sites bilateral.  Light touch is intact bilateral, Muscle strength normal.  MUSCULOSKELETAL: acceptable muscle strength, tone and stability bilateral.  Intrinsic muscluature intact bilateral.  Rectus appearance of foot and digits noted bilateral. Palpable pain noted through the shaft of the second and third metatarsals of the right foot.  Localized swelling over the second and third metatarsals of the right foot.  There is no evidence of any pain in the cuboid area of his right foot. After an evaluation following wearing his Unna boot.  DERMATOLOGIC: skin color, texture,  and turgor are within normal limits.  No preulcerative lesions or ulcers  are seen, no interdigital maceration noted.  No open lesions present.  Digital nails are asymptomatic. No drainage noted.         Assessment & Plan:  Stress fracture 2,3 right foot.  ROV.  Examination after the Unna boot removed reveals that there is localized pain on the shaft of the second and third metatarsals of the right foot.  I believe. Treating him now for stress fracture and he was compensating for his pain initially causing significant discomfort in the cuboid region of his right foot.  Gave this patient and anklet to be worn in his right foot and told he needs to ambulate in a Cam Walker, which he says he has in the car.  This patient was given an appointment for 3 weeks at which time an x-ray will be taken to check on his second and third metatarsals.  Call the office with any concerns arise.   Helane GuntherGregory Narda Fundora DPM      Helane GuntherGregory Arlana Canizales DPM

## 2017-07-27 ENCOUNTER — Encounter: Payer: BLUE CROSS/BLUE SHIELD | Admitting: Family Medicine

## 2017-07-28 ENCOUNTER — Ambulatory Visit
Admission: RE | Admit: 2017-07-28 | Discharge: 2017-07-28 | Disposition: A | Discharge status: Home / Self Care | Payer: BLUE CROSS/BLUE SHIELD | Source: Ambulatory Visit | Attending: Family Medicine | Admitting: Family Medicine

## 2017-07-28 ENCOUNTER — Encounter: Payer: Self-pay | Admitting: Family Medicine

## 2017-07-28 ENCOUNTER — Ambulatory Visit (INDEPENDENT_AMBULATORY_CARE_PROVIDER_SITE_OTHER): Payer: BLUE CROSS/BLUE SHIELD | Admitting: Family Medicine

## 2017-07-28 VITALS — BP 110/72 | HR 100 | Temp 98.3°F | Resp 18 | Ht 74.0 in | Wt 242.8 lb

## 2017-07-28 DIAGNOSIS — I2510 Atherosclerotic heart disease of native coronary artery without angina pectoris: Secondary | ICD-10-CM

## 2017-07-28 DIAGNOSIS — E7800 Pure hypercholesterolemia, unspecified: Secondary | ICD-10-CM

## 2017-07-28 DIAGNOSIS — R05 Cough: Secondary | ICD-10-CM | POA: Diagnosis not present

## 2017-07-28 DIAGNOSIS — R053 Chronic cough: Secondary | ICD-10-CM

## 2017-07-28 DIAGNOSIS — F419 Anxiety disorder, unspecified: Secondary | ICD-10-CM

## 2017-07-28 MED ORDER — ATORVASTATIN CALCIUM 40 MG PO TABS: 40.0000 mg | ORAL_TABLET | Freq: Every day | ORAL | 1 refills | Status: DC

## 2017-07-28 MED ORDER — ALPRAZOLAM 1 MG PO TABS: 1.0000 mg | ORAL_TABLET | Freq: Four times a day (QID) | ORAL | 2 refills | Status: DC | PRN

## 2017-07-28 MED ORDER — FLUTICASONE-UMECLIDIN-VILANT 100-62.5-25 MCG/INH IN AEPB: 1.0000 | INHALATION_SPRAY | Freq: Every day | RESPIRATORY_TRACT | 0 refills | Status: DC

## 2017-07-28 NOTE — Progress Notes (Signed)
Name: Cory Espinoza   MRN: [id number]    DOB: 08/24/1971   Date:07/28/2017       Progress Note  Subjective  Chief Complaint  Chief Complaint  Patient presents with  . Medication Refill    patient needs refills of his Plavix, Atorvastatin, Xanax  . Cough    patient also presents with dry cough for about 3 weeks, especially while lying down. no otc meds taken. stated that there is some right sided back pain near his ribs    Cough  This is a new problem. The current episode started 1 to 4 weeks ago (3 weeks ago). The problem has been unchanged. The cough is non-productive. Associated symptoms include shortness of breath. Pertinent negatives include no chest pain, chills, fever, headaches, myalgias, postnasal drip, sore throat or wheezing. The symptoms are aggravated by lying down. He has tried steroid inhaler and a beta-agonist inhaler for the symptoms. His past medical history is significant for COPD.  Anxiety  Presents for follow-up visit. Symptoms include shortness of breath. Patient reports no chest pain, depressed mood, excessive worry, nervous/anxious behavior or panic. The severity of symptoms is moderate. The quality of sleep is good.    Hyperlipidemia  This is a chronic problem. The problem is controlled. Recent lipid tests were reviewed and are normal. Associated symptoms include shortness of breath. Pertinent negatives include no chest pain, leg pain or myalgias. Current antihyperlipidemic treatment includes statins.    Past Medical History:  Diagnosis Date  . Allergy   . Anxiety   . COPD (chronic obstructive pulmonary disease) (HCC)   . Coronary artery disease   . Hyperlipidemia   . Hypertension     Past Surgical History:  Procedure Laterality Date  . BACK SURGERY  1996  . CARDIAC CATHETERIZATION Left 04/27/2016   Procedure: Left Heart Cath and Coronary Angiography;  Surgeon: Lamar Blinks, MD;  Location: [facility];  Service: Cardiovascular;  Laterality:  Left;  . CARDIAC CATHETERIZATION N/A 04/27/2016   Procedure: Intravascular Pressure Wire/FFR Study;  Surgeon: Alwyn Pea, MD;  Location: [facility] CV [facility];  Service: Cardiovascular;  Laterality: N/A;  . CATHETERIZATION OF PULMONARY ARTERY WITH RETRIEVAL OF FOREIGN BODY Bilateral 04/07/2011   heart  . KNEE ARTHROSCOPY Bilateral 2004  . LUMBAR DISC SURGERY  1999    Family History  Problem Relation Age of Onset  . Heart disease Mother   . Heart disease Father   . Diabetes Sister   . Hypertension Brother   . Cancer Paternal Grandmother        breast  . Diabetes Paternal Grandmother     Social History   Social History  . Marital status: Married    Spouse name: N/A  . Number of children: N/A  . Years of education: N/A   Occupational History  . Not on file.   Social History Main Topics  . Smoking status: Former Smoker    Packs/day: 2.00    Years: 23.00    Types: Cigarettes    Start date: 10/28/1989    Quit date: 10/28/2012  . Smokeless tobacco: Current User    Types: Snuff  . Alcohol use 0.0 oz/week     Comment: rarely  . Drug use: No  . Sexual activity: Yes    Partners: Female   Other Topics Concern  . Not on file   Social History Narrative  . No narrative on file     Current Outpatient Prescriptions:  .  meloxicam (MOBIC)  15 MG tablet, Take 1 tablet (15 mg total) by mouth daily., Disp: 30 tablet, Rfl: 3 .  pantoprazole (PROTONIX) 40 MG tablet, Take 1 tablet (40 mg total) by mouth daily., Disp: 90 tablet, Rfl: 0 .  Saw Palmetto 160 MG CAPS, Take by mouth., Disp: , Rfl:  .  albuterol (PROVENTIL HFA;VENTOLIN HFA) 108 (90 Base) MCG/ACT inhaler, Inhale 1-2 puffs into the lungs every 4 (four) hours as needed for wheezing or shortness of breath., Disp: 1 Inhaler, Rfl: 0 .  ALPRAZolam (XANAX) 1 MG tablet, Take 1 tablet (1 mg total) by mouth 4 (four) times daily as needed for anxiety., Disp: 120 tablet, Rfl: 2 .  atorvastatin (LIPITOR) 40 MG tablet, Take 1 tablet  (40 mg total) by mouth daily at 6 PM., Disp: 90 tablet, Rfl: 1 .  B COMPLEX VITAMINS PO, Take by mouth., Disp: , Rfl:  .  budesonide-formoterol (SYMBICORT) 160-4.5 MCG/ACT inhaler, Inhale 2 puffs into the lungs every morning., Disp: 1 Inhaler, Rfl: 5 .  clopidogrel (PLAVIX) 75 MG tablet, Take 1 tablet (75 mg total) by mouth daily., Disp: 90 tablet, Rfl: 1 .  sodium chloride (OCEAN) 0.65 % SOLN nasal spray, Place 2 sprays into both nostrils every 2 (two) hours while awake., Disp: , Rfl: 0  Allergies  Allergen Reactions  . Amoxicillin Other (See Comments)  . Aspirin Other (See Comments)    swelling of throat swelling of throat  . Codeine Hives  . Hydrocodone-Acetaminophen Other (See Comments)    itching itching  . Oxycodone-Acetaminophen Nausea And Vomiting  . Oxycodone-Acetaminophen Nausea And Vomiting  . Penicillins Other (See Comments)    unknown unknown     Review of Systems  Constitutional: Negative for chills and fever.  HENT: Negative for postnasal drip and sore throat.   Respiratory: Positive for cough and shortness of breath. Negative for wheezing.   Cardiovascular: Negative for chest pain.  Musculoskeletal: Negative for myalgias.  Neurological: Negative for headaches.  Psychiatric/Behavioral: The patient is not nervous/anxious.     Objective  Vitals:   07/28/17 1113  BP: 110/72  Pulse: 100  Resp: 18  Temp: 98.3 F (36.8 C)  TempSrc: Oral  SpO2: 95%  Weight: 242 lb 12.8 oz (110.1 kg)  Height: 6\' 2"  (1.88 m)    Physical Exam  Constitutional: He is oriented to person, place, and time and well-developed, well-nourished, and in no distress.  HENT:  Head: Normocephalic and atraumatic.  Cardiovascular: Normal rate, regular rhythm and normal heart sounds.   No murmur heard. Pulmonary/Chest: Effort normal and breath sounds normal. He has no wheezes.  Neurological: He is alert and oriented to person, place, and time.  Psychiatric: Mood, memory, affect and  judgment normal.  Nursing note and vitals reviewed.     Recent Results (from the past 2160 hour(s))  Basic Metabolic Panel (BMET)     Status: Abnormal   Collection Time: 07/06/17  1:54 PM  Result Value Ref Range   Glucose 89 65 - 99 mg/dL   BUN 7 6 - 24 mg/dL   Creatinine, Ser 1.61 0.76 - 1.27 mg/dL   GFR calc non Af Amer 106 >59 mL/min/1.73   GFR calc Af Amer 123 >59 mL/min/1.73   BUN/Creatinine Ratio 8 (L) 9 - 20   Sodium 139 134 - 144 mmol/L   Potassium 4.6 3.5 - 5.2 mmol/L   Chloride 103 96 - 106 mmol/L   CO2 24 20 - 29 mmol/L   Calcium 9.6 8.7 - 10.2 mg/dL  Magnesium     Status: None   Collection Time: 07/06/17  1:54 PM  Result Value Ref Range   Magnesium 1.9 1.6 - 2.3 mg/dL     Assessment & Plan  1. Persistent dry cough Likely because of COPD, changed Symbicort to Trelegy, obtain chest x-ray for evaluation - Fluticasone-Umeclidin-Vilant (TRELEGY ELLIPTA) 100-62.5-25 MCG/INH AEPB; Inhale 1 puff into the lungs daily.  Dispense: 28 each; Refill: 0 - DG Chest 2 View; Future - CBC with Differential  2. Anxiety Stable and responsive to alprazolam - ALPRAZolam (XANAX) 1 MG tablet; Take 1 tablet (1 mg total) by mouth 4 (four) times daily as needed for anxiety.  Dispense: 120 tablet; Refill: 2  3. Pure hypercholesterolemia  - atorvastatin (LIPITOR) 40 MG tablet; Take 1 tablet (40 mg total) by mouth daily at 6 PM.  Dispense: 90 tablet; Refill: 1 - Lipid panel  4. Atherosclerosis of native coronary artery of native heart without angina pectoris  - atorvastatin (LIPITOR) 40 MG tablet; Take 1 tablet (40 mg total) by mouth daily at 6 PM.  Dispense: 90 tablet; Refill: 1    Emmy Keng Asad [facility] 07/28/2017 11:28 AM

## 2017-07-29 MED ORDER — FLUTICASONE-UMECLIDIN-VILANT 100-62.5-25 MCG/INH IN AEPB: 1.0000 | INHALATION_SPRAY | Freq: Every day | RESPIRATORY_TRACT | 0 refills | Status: DC

## 2017-08-10 ENCOUNTER — Other Ambulatory Visit: Payer: Self-pay | Admitting: Family Medicine

## 2017-08-10 ENCOUNTER — Ambulatory Visit (INDEPENDENT_AMBULATORY_CARE_PROVIDER_SITE_OTHER): Payer: BLUE CROSS/BLUE SHIELD | Admitting: Family Medicine

## 2017-08-10 ENCOUNTER — Encounter: Payer: Self-pay | Admitting: Family Medicine

## 2017-08-10 VITALS — BP 122/86 | HR 90 | Temp 98.2°F | Resp 16 | Ht 74.0 in | Wt 241.0 lb

## 2017-08-10 DIAGNOSIS — E7800 Pure hypercholesterolemia, unspecified: Secondary | ICD-10-CM | POA: Diagnosis not present

## 2017-08-10 DIAGNOSIS — Z0000 Encounter for general adult medical examination without abnormal findings: Secondary | ICD-10-CM

## 2017-08-10 NOTE — Progress Notes (Signed)
Name: Cory Espinoza   MRN: [id number]    DOB: 03/10/1971   Date:08/10/2017       Progress Note  Subjective  Chief Complaint  Chief Complaint  Patient presents with  . Annual Exam    HPI  Pt. Presents for complete physical exam. He is due for prostate cancer screening.    Past Medical History:  Diagnosis Date  . Allergy   . Anxiety   . COPD (chronic obstructive pulmonary disease) (HCC)   . Coronary artery disease   . Hyperlipidemia   . Hypertension     Past Surgical History:  Procedure Laterality Date  . BACK SURGERY  1996  . CARDIAC CATHETERIZATION Left 04/27/2016   Procedure: Left Heart Cath and Coronary Angiography;  Surgeon: Lamar BlinksBruce J Kowalski, MD;  Location: [facility];  Service: Cardiovascular;  Laterality: Left;  . CARDIAC CATHETERIZATION N/A 04/27/2016   Procedure: Intravascular Pressure Wire/FFR Study;  Surgeon: Alwyn Peawayne D Callwood, MD;  Location: [facility];  Service: Cardiovascular;  Laterality: N/A;  . CATHETERIZATION OF PULMONARY ARTERY WITH RETRIEVAL OF FOREIGN BODY Bilateral 04/07/2011   heart  . KNEE ARTHROSCOPY Bilateral 2004  . LUMBAR DISC SURGERY  1999    Family History  Problem Relation Age of Onset  . Heart disease Mother   . Heart disease Father   . Diabetes Sister   . Hypertension Brother   . Cancer Paternal Grandmother        breast  . Diabetes Paternal Grandmother     Social History   Social History  . Marital status: Married    Spouse name: N/A  . Number of children: N/A  . Years of education: N/A   Occupational History  . Not on file.   Social History Main Topics  . Smoking status: Former Smoker    Packs/day: 2.00    Years: 23.00    Types: Cigarettes    Start date: 10/28/1989    Quit date: 10/28/2012  . Smokeless tobacco: Current User    Types: Snuff  . Alcohol use 0.0 oz/week     Comment: rarely  . Drug use: No  . Sexual activity: Yes    Partners: Female   Other Topics Concern  . Not on file    Social History Narrative  . No narrative on file     Current Outpatient Prescriptions:  .  albuterol (PROVENTIL HFA;VENTOLIN HFA) 108 (90 Base) MCG/ACT inhaler, Inhale 1-2 puffs into the lungs every 4 (four) hours as needed for wheezing or shortness of breath., Disp: 1 Inhaler, Rfl: 0 .  ALPRAZolam (XANAX) 1 MG tablet, Take 1 tablet (1 mg total) by mouth 4 (four) times daily as needed for anxiety., Disp: 120 tablet, Rfl: 2 .  atorvastatin (LIPITOR) 40 MG tablet, Take 1 tablet (40 mg total) by mouth daily at 6 PM., Disp: 90 tablet, Rfl: 1 .  B COMPLEX VITAMINS PO, Take by mouth., Disp: , Rfl:  .  clopidogrel (PLAVIX) 75 MG tablet, Take 1 tablet (75 mg total) by mouth daily., Disp: 90 tablet, Rfl: 1 .  Fluticasone-Umeclidin-Vilant (TRELEGY ELLIPTA) 100-62.5-25 MCG/INH AEPB, Inhale 1 puff into the lungs daily., Disp: 60 each, Rfl: 0 .  meloxicam (MOBIC) 15 MG tablet, Take 1 tablet (15 mg total) by mouth daily., Disp: 30 tablet, Rfl: 3 .  pantoprazole (PROTONIX) 40 MG tablet, Take 1 tablet (40 mg total) by mouth daily., Disp: 90 tablet, Rfl: 0 .  Saw Palmetto 160 MG CAPS, Take by mouth., Disp: ,  Rfl:  .  sodium chloride (OCEAN) 0.65 % SOLN nasal spray, Place 2 sprays into both nostrils every 2 (two) hours while awake., Disp: , Rfl: 0  Allergies  Allergen Reactions  . Amoxicillin Other (See Comments)  . Aspirin Other (See Comments)    swelling of throat swelling of throat  . Codeine Hives  . Hydrocodone-Acetaminophen Other (See Comments)    itching itching  . Oxycodone-Acetaminophen Nausea And Vomiting  . Oxycodone-Acetaminophen Nausea And Vomiting  . Penicillins Other (See Comments)    unknown unknown     Review of Systems  Constitutional: Negative for chills, fever and malaise/fatigue.  HENT: Negative for congestion, ear pain and sore throat.   Eyes: Negative for blurred vision and double vision.  Respiratory: Positive for cough. Negative for sputum production, shortness of  breath and wheezing.   Cardiovascular: Negative for chest pain and leg swelling.  Gastrointestinal: Negative for abdominal pain, blood in stool, constipation, diarrhea, nausea and vomiting.  Genitourinary: Negative for hematuria.  Musculoskeletal: Negative for back pain and neck pain.  Neurological: Negative for dizziness and headaches.  Psychiatric/Behavioral: Negative for depression. The patient is nervous/anxious (takes medications for anxiety.). The patient does not have insomnia.       Objective  Vitals:   08/10/17 0826  BP: 122/86  Pulse: 90  Resp: 16  Temp: 98.2 F (36.8 C)  TempSrc: Oral  SpO2: 96%  Weight: 241 lb (109.3 kg)  Height: 6\' 2"  (1.88 m)    Physical Exam  Constitutional: He is oriented to person, place, and time and well-developed, well-nourished, and in no distress.  HENT:  Head: Normocephalic and atraumatic.  Right Ear: External ear normal.  Left Ear: External ear normal.  Eyes: Pupils are equal, round, and reactive to light.  Cardiovascular: Normal rate, regular rhythm and normal heart sounds.   No murmur heard. Pulmonary/Chest: Effort normal and breath sounds normal. He has no wheezes.  Abdominal: Soft. Bowel sounds are normal.  Musculoskeletal: He exhibits no edema.  Neurological: He is alert and oriented to person, place, and time.  Psychiatric: Mood, memory, affect and judgment normal.  Nursing note and vitals reviewed.      Recent Results (from the past 2160 hour(s))  Basic Metabolic Panel (BMET)     Status: Abnormal   Collection Time: 07/06/17  1:54 PM  Result Value Ref Range   Glucose 89 65 - 99 mg/dL   BUN 7 6 - 24 mg/dL   Creatinine, Ser 4.09 0.76 - 1.27 mg/dL   GFR calc non Af Amer 106 >59 mL/min/1.73   GFR calc Af Amer 123 >59 mL/min/1.73   BUN/Creatinine Ratio 8 (L) 9 - 20   Sodium 139 134 - 144 mmol/L   Potassium 4.6 3.5 - 5.2 mmol/L   Chloride 103 96 - 106 mmol/L   CO2 24 20 - 29 mmol/L   Calcium 9.6 8.7 - 10.2 mg/dL   Magnesium     Status: None   Collection Time: 07/06/17  1:54 PM  Result Value Ref Range   Magnesium 1.9 1.6 - 2.3 mg/dL     Assessment & Plan  1. Encounter for annual physical exam Obtain age-appropriate laboratory screenings, EKG is normal sinus rhythm - CBC with Differential/Platelet - COMPLETE METABOLIC PANEL WITH GFR - TSH - VITAMIN D 25 Hydroxy (Vit-D Deficiency, Fractures) - PSA - EKG 12-Lead  2. Pure hypercholesterolemia  - Lipid panel   Cory Espinoza [facility]ad [facility] 08/10/2017 8:48 AM

## 2017-08-11 LAB — COMPREHENSIVE METABOLIC PANEL
ALT: 34 IU/L (ref 0–44)
AST: 26 IU/L (ref 0–40)
Albumin/Globulin Ratio: 1.7 (ref 1.2–2.2)
Albumin: 4.5 g/dL (ref 3.5–5.5)
Alkaline Phosphatase: 100 IU/L (ref 39–117)
BUN/Creatinine Ratio: 12 (ref 9–20)
BUN: 10 mg/dL (ref 6–24)
Bilirubin Total: 0.3 mg/dL (ref 0.0–1.2)
CO2: 19 mmol/L — ABNORMAL LOW (ref 20–29)
Calcium: 9.5 mg/dL (ref 8.7–10.2)
Chloride: 104 mmol/L (ref 96–106)
Creatinine, Ser: 0.85 mg/dL (ref 0.76–1.27)
GFR calc Af Amer: 122 mL/min/{1.73_m2} (ref >59)
GFR calc non Af Amer: 105 mL/min/{1.73_m2} (ref >59)
Globulin, Total: 2.7 g/dL (ref 1.5–4.5)
Glucose: 103 mg/dL — ABNORMAL HIGH (ref 65–99)
Potassium: 4.7 mmol/L (ref 3.5–5.2)
Sodium: 139 mmol/L (ref 134–144)
Total Protein: 7.2 g/dL (ref 6.0–8.5)

## 2017-08-11 LAB — CBC WITH DIFFERENTIAL/PLATELET
Basophils Absolute: 0 10*3/uL (ref 0.0–0.2)
Basos: 0 %
EOS (ABSOLUTE): 0.1 10*3/uL (ref 0.0–0.4)
Eos: 2 %
Hematocrit: 48.4 % (ref 37.5–51.0)
Hemoglobin: 16.6 g/dL (ref 13.0–17.7)
Immature Grans (Abs): 0 10*3/uL (ref 0.0–0.1)
Immature Granulocytes: 0 %
Lymphocytes Absolute: 2.1 10*3/uL (ref 0.7–3.1)
Lymphs: 38 %
MCH: 32.9 pg (ref 26.6–33.0)
MCHC: 34.3 g/dL (ref 31.5–35.7)
MCV: 96 fL (ref 79–97)
Monocytes Absolute: 0.4 10*3/uL (ref 0.1–0.9)
Monocytes: 8 %
Neutrophils Absolute: 2.9 10*3/uL (ref 1.4–7.0)
Neutrophils: 52 %
Platelets: 219 10*3/uL (ref 150–379)
RBC: 5.05 x10E6/uL (ref 4.14–5.80)
RDW: 13.5 % (ref 12.3–15.4)
WBC: 5.7 10*3/uL (ref 3.4–10.8)

## 2017-08-11 LAB — LIPID PANEL W/O CHOL/HDL RATIO
Cholesterol, Total: 140 mg/dL (ref 100–199)
HDL: 39 mg/dL — ABNORMAL LOW (ref >39)
LDL Calculated: 88 mg/dL (ref 0–99)
Triglycerides: 65 mg/dL (ref 0–149)
VLDL Cholesterol Cal: 13 mg/dL (ref 5–40)

## 2017-08-11 LAB — VITAMIN D 25 HYDROXY (VIT D DEFICIENCY, FRACTURES): Vit D, 25-Hydroxy: 43.6 ng/mL (ref 30.0–100.0)

## 2017-08-11 LAB — TSH: TSH: 2.57 u[IU]/mL (ref 0.450–4.500)

## 2017-08-11 LAB — PSA: Prostate Specific Ag, Serum: 0.7 ng/mL (ref 0.0–4.0)

## 2017-08-12 ENCOUNTER — Ambulatory Visit (INDEPENDENT_AMBULATORY_CARE_PROVIDER_SITE_OTHER): Payer: BLUE CROSS/BLUE SHIELD

## 2017-08-12 ENCOUNTER — Other Ambulatory Visit: Payer: Self-pay | Admitting: Podiatry

## 2017-08-12 ENCOUNTER — Encounter: Payer: Self-pay | Admitting: Podiatry

## 2017-08-12 ENCOUNTER — Ambulatory Visit (INDEPENDENT_AMBULATORY_CARE_PROVIDER_SITE_OTHER): Payer: BLUE CROSS/BLUE SHIELD | Admitting: Podiatry

## 2017-08-12 DIAGNOSIS — S92001D Unspecified fracture of right calcaneus, subsequent encounter for fracture with routine healing: Secondary | ICD-10-CM

## 2017-08-12 DIAGNOSIS — M779 Enthesopathy, unspecified: Secondary | ICD-10-CM

## 2017-08-12 DIAGNOSIS — S92334D Nondisplaced fracture of third metatarsal bone, right foot, subsequent encounter for fracture with routine healing: Secondary | ICD-10-CM | POA: Diagnosis not present

## 2017-08-12 DIAGNOSIS — M778 Other enthesopathies, not elsewhere classified: Secondary | ICD-10-CM

## 2017-08-12 DIAGNOSIS — M7751 Other enthesopathy of right foot: Secondary | ICD-10-CM | POA: Diagnosis not present

## 2017-08-12 DIAGNOSIS — M19071 Primary osteoarthritis, right ankle and foot: Secondary | ICD-10-CM | POA: Diagnosis not present

## 2017-08-12 MED ORDER — MELOXICAM 15 MG PO TABS: 15.0000 mg | ORAL_TABLET | Freq: Every day | ORAL | 3 refills | Status: DC

## 2017-08-12 NOTE — Progress Notes (Signed)
This patient presents the office for reevaluation of his painful right foot.  He was diagnosed as having a stress fracture on 07/22/2017.  The stress  fracture was to the second and third metatarsals of the right foot.  Patient was told to ambulate in a Cam Walker, which she said he had in his car.  He says he had worn it for 2-1/2 weeks and there was improvement but it was not completely better.  He then says that he was with his granddaughter and re aggravated the painful area on the right foot.  He has continued to wear his Cam Dan HumphreysWalker even after the injury with his granddaughter.  He presents the office today saying that he still does experience pain on the top of his right foot that extends into his toes.  He returns to the office for a follow-up x-ray and we evaluation of his right foot.   GENERAL APPEARANCE: Alert, conversant. Appropriately groomed. No acute distress.  VASCULAR: Pedal pulses are  palpable at  [facility] bilateral.  Capillary refill time is immediate to all digits,  Normal temperature gradient.  Digital hair growth is present bilateral  NEUROLOGIC: sensation is normal to 5.07 monofilament at 5/5 sites bilateral.  Light touch is intact bilateral, Muscle strength normal.  MUSCULOSKELETAL: acceptable muscle strength, tone and stability bilateral.  Intrinsic muscluature intact bilateral.  Palpable pain noted to the shaft of the third metatarsal as well as extreme tenderness upon palpation of the third MPJ right foot.  There is still some localized swelling noted over the third metatarsal  . There is no pain, discomfort or swelling to the second metatarsal.  .  DERMATOLOGIC: skin color, texture, and turgor are within normal limits.  No preulcerative lesions or ulcers  are seen, no interdigital maceration noted.  No open lesions present.  Digital nails are asymptomatic. No drainage noted.  Closed metatarsal fracture third right foot.  ROV.  An x-ray was taken of this patient anad the xray   revealed no evidence of any stress fracture or bony pathology noted to the right foot .there appears to be no evidence of any arthritic changes noted on the x-ray .  This patient says he wants to return to work and is prepared to go to work immediately .  This patient still has pain noted to the third metatarsal shaft as well as the third MPJ right foot.  I discussed the matter with this patient and he says that he was planning on using the power step insoles as well as the anklet as well as taking the Mobic that was prescribed.  I told the patient I would feel that the next step is to receive an MRI of his right foot .  Patient declined having an MRI performed   . I did request and he agreed to get blood work, so we could get an arthritic profile to see if there are any arthritis  .  Patient was told he can return to work and to call the office if the problem reoccurs.   Marland Kitchen. He is given a schedule an appointment for 3 weeks for further evaluation of his right foot .Marland Kitchen. Prescribe Mobic.   Helane GuntherGregory Kristena Wilhelmi DPM

## 2017-08-13 LAB — ARTHRITIS PANEL
Anti Nuclear Antibody(ANA): NEGATIVE
Rhuematoid fact SerPl-aCnc: <10 IU/mL (ref 0.0–13.9)
Sed Rate: 5 mm/hr (ref 0–15)
Uric Acid: 4.4 mg/dL (ref 3.7–8.6)

## 2017-09-02 ENCOUNTER — Ambulatory Visit: Payer: BLUE CROSS/BLUE SHIELD | Admitting: Podiatry

## 2017-11-01 ENCOUNTER — Telehealth: Payer: Self-pay | Admitting: Family Medicine

## 2017-11-01 NOTE — Telephone Encounter (Signed)
Spoke to pt and he worked out his schedule to come to the apt. Pt also states he has enough medication to last him until wednesday.

## 2017-11-01 NOTE — Telephone Encounter (Signed)
Copied from CRM #[id number]. Topic: Inquiry >> Nov 01, 2017 11:49 AM Cory Espinoza L, NT wrote: Reason for CRM: Patient had to  reschedule appointment for work and needs a prescription for xanax and cholosterol  medicine refill and would like to come in  earlier than 11/28/at 3:40 if possible please call [phone] if possible  

## 2017-11-01 NOTE — Telephone Encounter (Signed)
Copied from CRM [phone]. Topic: Inquiry >> Nov 01, 2017 11:49 AM Stephannie LiSimmons, Jatara Huettner L, NT wrote: Reason for CRM: Patient had to  reschedule appointment for work and needs a prescription for xanax and cholosterol  medicine refill and would like to come in  earlier than 11/28/at 3:40 if possible please call [phone] if possible

## 2017-11-02 ENCOUNTER — Ambulatory Visit: Payer: BLUE CROSS/BLUE SHIELD | Admitting: Family Medicine

## 2017-11-03 ENCOUNTER — Encounter: Payer: Self-pay | Admitting: Family Medicine

## 2017-11-03 ENCOUNTER — Ambulatory Visit: Payer: BLUE CROSS/BLUE SHIELD | Admitting: Family Medicine

## 2017-11-03 VITALS — BP 134/84 | HR 113 | Temp 99.3°F | Resp 16 | Ht 74.0 in | Wt 242.5 lb

## 2017-11-03 DIAGNOSIS — G4733 Obstructive sleep apnea (adult) (pediatric): Secondary | ICD-10-CM

## 2017-11-03 DIAGNOSIS — E7800 Pure hypercholesterolemia, unspecified: Secondary | ICD-10-CM

## 2017-11-03 DIAGNOSIS — J029 Acute pharyngitis, unspecified: Secondary | ICD-10-CM | POA: Diagnosis not present

## 2017-11-03 DIAGNOSIS — F419 Anxiety disorder, unspecified: Secondary | ICD-10-CM

## 2017-11-03 DIAGNOSIS — I2510 Atherosclerotic heart disease of native coronary artery without angina pectoris: Secondary | ICD-10-CM | POA: Diagnosis not present

## 2017-11-03 LAB — POCT INFLUENZA A/B
Influenza A, POC: NEGATIVE
Influenza B, POC: NEGATIVE

## 2017-11-03 LAB — POCT RAPID STREP A (OFFICE): Rapid Strep A Screen: NEGATIVE

## 2017-11-03 MED ORDER — ATORVASTATIN CALCIUM 40 MG PO TABS: 40.0000 mg | ORAL_TABLET | Freq: Every day | ORAL | 1 refills | Status: DC

## 2017-11-03 MED ORDER — ALPRAZOLAM 1 MG PO TABS: 1.0000 mg | ORAL_TABLET | Freq: Four times a day (QID) | ORAL | 2 refills | Status: DC | PRN

## 2017-11-03 MED ORDER — CLOPIDOGREL BISULFATE 75 MG PO TABS: 75.0000 mg | ORAL_TABLET | Freq: Every day | ORAL | 1 refills | Status: DC

## 2017-11-03 NOTE — Progress Notes (Signed)
Name: Cory Espinoza   MRN: [id number]    DOB: December 26, 1970   Date:11/03/2017       Progress Note  Subjective  Chief Complaint  Chief Complaint  Patient presents with  . Medication Refill  . Anxiety    f/u  . Sore Throat    Mild; chills, aching body, coughing all happen this morning   . Hyperlipidemia    f/u    Anxiety  Presents for follow-up visit. Symptoms include depressed mood, excessive worry, insomnia, irritability, nervous/anxious behavior and restlessness. Patient reports no palpitations, panic or shortness of breath. The severity of symptoms is moderate and causing significant distress.    Sore Throat   This is a new problem. The current episode started today. The problem has been unchanged. The maximum temperature recorded prior to his arrival was 102 - 102.9 F. Associated symptoms include coughing and trouble swallowing. Pertinent negatives include no congestion, ear pain, headaches or shortness of breath. He has had no exposure to strep or mono. He has tried nothing for the symptoms.  Hyperlipidemia  This is a chronic problem. The problem is controlled. Recent lipid tests were reviewed and are normal. Pertinent negatives include no leg pain, myalgias or shortness of breath. Current antihyperlipidemic treatment includes statins.   Pt. also needs a new CPAP machine, has history of obstructive sleep apnea, on CPAP since 2012 after being diagnosed with sleep apnea through a sleep study. His current machine is not in order, needs to be cleaned, may have memory card malfunction. He has gained over 10 lbs since he has not been able to properly use the machine.     Past Medical History:  Diagnosis Date  . Allergy   . Anxiety   . COPD (chronic obstructive pulmonary disease) (HCC)   . Coronary artery disease   . Hyperlipidemia   . Hypertension     Past Surgical History:  Procedure Laterality Date  . BACK SURGERY  1996  . CARDIAC CATHETERIZATION Left 04/27/2016   Procedure:  Left Heart Cath and Coronary Angiography;  Surgeon: Lamar Blinks, MD;  Location: [facility] INVASIVE CV [facility];  Service: Cardiovascular;  Laterality: Left;  . CARDIAC CATHETERIZATION N/A 04/27/2016   Procedure: Intravascular Pressure Wire/FFR Study;  Surgeon: Alwyn Pea, MD;  Location: [facility] INVASIVE CV LAB;  Service: Cardiovascular;  Laterality: N/A;  . CATHETERIZATION OF PULMONARY ARTERY WITH RETRIEVAL OF FOREIGN BODY Bilateral 04/07/2011   heart  . KNEE ARTHROSCOPY Bilateral 2004  . LUMBAR DISC SURGERY  1999    Family History  Problem Relation Age of Onset  . Heart disease Mother   . Heart disease Father   . Diabetes Sister   . Hypertension Brother   . Cancer Paternal Grandmother        breast  . Diabetes Paternal Grandmother     Social History   Socioeconomic History  . Marital status: Married    Spouse name: Not on file  . Number of children: Not on file  . Years of education: Not on file  . Highest education level: Not on file  Social Needs  . Financial resource strain: Not on file  . Food insecurity - worry: Not on file  . Food insecurity - inability: Not on file  . Transportation needs - medical: Not on file  . Transportation needs - non-medical: Not on file  Occupational History  . Not on file  Tobacco Use  . Smoking status: Former Smoker    Packs/day: 2.00  Years: 23.00    Pack years: 46.00    Types: Cigarettes    Start date: 10/28/1989    Last attempt to quit: 10/28/2012    Years since quitting: 5.0  . Smokeless tobacco: Current User    Types: Snuff  Substance and Sexual Activity  . Alcohol use: Yes    Alcohol/week: 0.0 oz    Comment: rarely  . Drug use: No  . Sexual activity: Yes    Partners: Female  Other Topics Concern  . Not on file  Social History Narrative  . Not on file     Current Outpatient Medications:  .  albuterol (PROVENTIL HFA;VENTOLIN HFA) 108 (90 Base) MCG/ACT inhaler, Inhale 1-2 puffs into the lungs every 4 (four) hours as  needed for wheezing or shortness of breath., Disp: 1 Inhaler, Rfl: 0 .  ALPRAZolam (XANAX) 1 MG tablet, Take 1 tablet (1 mg total) by mouth 4 (four) times daily as needed for anxiety., Disp: 120 tablet, Rfl: 2 .  atorvastatin (LIPITOR) 40 MG tablet, Take 1 tablet (40 mg total) by mouth daily at 6 PM., Disp: 90 tablet, Rfl: 1 .  B COMPLEX VITAMINS PO, Take by mouth., Disp: , Rfl:  .  clopidogrel (PLAVIX) 75 MG tablet, Take 1 tablet (75 mg total) by mouth daily., Disp: 90 tablet, Rfl: 1 .  meloxicam (MOBIC) 15 MG tablet, Take 1 tablet (15 mg total) by mouth daily., Disp: 30 tablet, Rfl: 3 .  pantoprazole (PROTONIX) 40 MG tablet, Take 1 tablet (40 mg total) by mouth daily., Disp: 90 tablet, Rfl: 0 .  Saw Palmetto 160 MG CAPS, Take by mouth., Disp: , Rfl:  .  Fluticasone-Umeclidin-Vilant (TRELEGY ELLIPTA) 100-62.5-25 MCG/INH AEPB, Inhale 1 puff into the lungs daily. (Patient not taking: Reported on 11/03/2017), Disp: 60 each, Rfl: 0 .  sodium chloride (OCEAN) 0.65 % SOLN nasal spray, Place 2 sprays into both nostrils every 2 (two) hours while awake., Disp: , Rfl: 0  Allergies  Allergen Reactions  . Amoxicillin Other (See Comments)  . Aspirin Other (See Comments)    swelling of throat swelling of throat  . Codeine Hives  . Hydrocodone-Acetaminophen Other (See Comments)    itching itching  . Oxycodone-Acetaminophen Nausea And Vomiting  . Oxycodone-Acetaminophen Nausea And Vomiting  . Penicillins Other (See Comments)    unknown unknown     Review of Systems  Constitutional: Positive for irritability.  HENT: Positive for trouble swallowing. Negative for congestion and ear pain.   Respiratory: Positive for cough. Negative for shortness of breath.   Cardiovascular: Negative for palpitations.  Musculoskeletal: Negative for myalgias.  Neurological: Negative for headaches.  Psychiatric/Behavioral: The patient is nervous/anxious and has insomnia.      Objective  Vitals:   11/03/17 1511   BP: 134/84  Pulse: (!) 113  Resp: 16  Temp: 99.3 F (37.4 C)  TempSrc: Oral  SpO2: 96%  Weight: 242 lb 8 oz (110 kg)  Height: 6\' 2"  (1.88 m)    Physical Exam  Constitutional: He is oriented to person, place, and time and well-developed, well-nourished, and in no distress.  HENT:  Head: Normocephalic and atraumatic.  Nose: Right sinus exhibits no maxillary sinus tenderness and no frontal sinus tenderness. Left sinus exhibits no maxillary sinus tenderness and no frontal sinus tenderness.  Mouth/Throat: Posterior oropharyngeal edema and posterior oropharyngeal erythema present. No oropharyngeal exudate.  Cardiovascular: Regular rhythm, S1 normal, S2 normal and normal heart sounds. Tachycardia present.  No murmur heard. Pulmonary/Chest: Effort normal and breath  sounds normal. No respiratory distress. He has no decreased breath sounds. He has no wheezes. He has no rhonchi.  Abdominal: Soft.  Neurological: He is alert and oriented to person, place, and time.  Psychiatric: Mood, memory, affect and judgment normal.  Nursing note and vitals reviewed.     Recent Results (from the past 2160 hour(s))  CBC with Differential/Platelet     Status: None   Collection Time: 08/10/17  9:38 AM  Result Value Ref Range   WBC 5.7 3.4 - 10.8 x10E3/uL   RBC 5.05 4.14 - 5.80 x10E6/uL   Hemoglobin 16.6 13.0 - 17.7 g/dL   Hematocrit 29.5 62.1 - 51.0 %   MCV 96 79 - 97 fL   MCH 32.9 26.6 - 33.0 pg   MCHC 34.3 31.5 - 35.7 g/dL   RDW 30.8 65.7 - 84.6 %   Platelets 219 150 - 379 x10E3/uL   Neutrophils 52 Not Estab. %   Lymphs 38 Not Estab. %   Monocytes 8 Not Estab. %   Eos 2 Not Estab. %   Basos 0 Not Estab. %   Neutrophils Absolute 2.9 1.4 - 7.0 x10E3/uL   Lymphocytes Absolute 2.1 0.7 - 3.1 x10E3/uL   Monocytes Absolute 0.4 0.1 - 0.9 x10E3/uL   EOS (ABSOLUTE) 0.1 0.0 - 0.4 x10E3/uL   Basophils Absolute 0.0 0.0 - 0.2 x10E3/uL   Immature Granulocytes 0 Not Estab. %   Immature Grans (Abs) 0.0 0.0  - 0.1 x10E3/uL  Comprehensive metabolic panel     Status: Abnormal   Collection Time: 08/10/17  9:38 AM  Result Value Ref Range   Glucose 103 (H) 65 - 99 mg/dL   BUN 10 6 - 24 mg/dL   Creatinine, Ser 9.62 0.76 - 1.27 mg/dL   GFR calc non Af Amer 105 >59 mL/min/1.73   GFR calc Af Amer 122 >59 mL/min/1.73   BUN/Creatinine Ratio 12 9 - 20   Sodium 139 134 - 144 mmol/L   Potassium 4.7 3.5 - 5.2 mmol/L   Chloride 104 96 - 106 mmol/L   CO2 19 (L) 20 - 29 mmol/L   Calcium 9.5 8.7 - 10.2 mg/dL   Total Protein 7.2 6.0 - 8.5 g/dL   Albumin 4.5 3.5 - 5.5 g/dL   Globulin, Total 2.7 1.5 - 4.5 g/dL   Albumin/Globulin Ratio 1.7 1.2 - 2.2   Bilirubin Total 0.3 0.0 - 1.2 mg/dL   Alkaline Phosphatase 100 39 - 117 IU/L   AST 26 0 - 40 IU/L   ALT 34 0 - 44 IU/L  Lipid Panel w/o Chol/[name] Ratio     Status: Abnormal   Collection Time: 08/10/17  9:38 AM  Result Value Ref Range   Cholesterol, Total 140 100 - 199 mg/dL   Triglycerides 65 0 - 149 mg/dL   [name] 39 (L) >95 mg/dL   VLDL Cholesterol Cal 13 5 - 40 mg/dL   LDL Calculated 88 0 - 99 mg/dL  TSH     Status: None   Collection Time: 08/10/17  9:38 AM  Result Value Ref Range   TSH 2.570 0.450 - 4.500 uIU/mL  PSA     Status: None   Collection Time: 08/10/17  9:38 AM  Result Value Ref Range   Prostate Specific Ag, Serum 0.7 0.0 - 4.0 ng/mL    Comment: Roche ECLIA methodology. According to the American Urological Association, Serum PSA should decrease and remain at undetectable levels after radical prostatectomy. The AUA defines biochemical recurrence as an initial  PSA value 0.2 ng/mL or greater followed by a subsequent confirmatory PSA value 0.2 ng/mL or greater. Values obtained with different assay methods or kits cannot be used interchangeably. Results cannot be interpreted as absolute evidence of the presence or absence of malignant disease.   VITAMIN D 25 Hydroxy (Vit-D Deficiency, Fractures)     Status: None   Collection Time: 08/10/17   9:38 AM  Result Value Ref Range   Vit D, 25-Hydroxy 43.6 30.0 - 100.0 ng/mL    Comment: Vitamin D deficiency has been defined by the Institute of Medicine and an Endocrine Society practice guideline as a level of serum 25-OH vitamin D less than 20 ng/mL (1,2). The Endocrine Society went on to further define vitamin D insufficiency as a level between 21 and 29 ng/mL (2). 1. IOM (Institute of Medicine). 2010. Dietary reference    intakes for calcium and D. [place]: The    [employer]. 2. Holick MF, Binkley [place], Bischoff-Ferrari HA, et al.    Evaluation, treatment, and prevention of vitamin D    deficiency: an Endocrine Society clinical practice    guideline. JCEM. 2011 Jul; 96(7):1911-30.   Arthritis Panel     Status: None   Collection Time: 08/12/17 10:16 AM  Result Value Ref Range   Uric Acid 4.4 3.7 - 8.6 mg/dL    Comment:            Therapeutic target for gout patients: <6.0   Anit Nuclear Antibody(ANA) Negative Negative   Rhuematoid fact SerPl-aCnc <10.0 0.0 - 13.9 IU/mL   Sed Rate 5 0 - 15 mm/hr     Assessment & Plan  1. Sore throat Likely viral pharyngitis, rapid strep and influenza negative, explained that the illness would likely resolve within a few days with conservative treatment. - POCT Influenza A/B - POCT rapid strep A  2. Anxiety Symptoms of anxiety are stable on Alprazolam taken 4 times daily as needed, refills provided - ALPRAZolam (XANAX) 1 MG tablet; Take 1 tablet (1 mg total) by mouth 4 (four) times daily as needed for anxiety.  Dispense: 120 tablet; Refill: 2  3. Atherosclerosis of native coronary artery of native heart without angina pectoris  - clopidogrel (PLAVIX) 75 MG tablet; Take 1 tablet (75 mg total) by mouth daily.  Dispense: 90 tablet; Refill: 1 - atorvastatin (LIPITOR) 40 MG tablet; Take 1 tablet (40 mg total) by mouth daily at 6 PM.  Dispense: 90 tablet; Refill: 1  4. Pure hypercholesterolemia  - atorvastatin (LIPITOR) 40  MG tablet; Take 1 tablet (40 mg total) by mouth daily at 6 PM.  Dispense: 90 tablet; Refill: 1  5. Obstructive sleep apnea  New prescription for CPAP machine provided  Toys ''R'' UsSyed Asad A. Faylene KurtzShah [facility] 11/03/2017 3:28 PM

## 2017-11-04 ENCOUNTER — Telehealth: Payer: Self-pay | Admitting: Family Medicine

## 2017-11-04 DIAGNOSIS — J029 Acute pharyngitis, unspecified: Secondary | ICD-10-CM

## 2017-11-04 MED ORDER — AZITHROMYCIN 250 MG PO TABS: ORAL_TABLET | ORAL | 0 refills | Status: DC

## 2017-11-04 NOTE — Telephone Encounter (Signed)
Pt stated he is shaking, coughing mucus. Pt states he is running a fever 101 highest. Pt states his sore throat is worst and pt couldn't not sleep at night due to waking up and coughing. Pt states he is suppose to go back to work tomorrow morning but not able to. Pt started taking sinus, cold and cough Alka seltzer plus

## 2017-11-04 NOTE — Telephone Encounter (Signed)
Copied from CRM [phone]. Topic: General - Other >> Nov 04, 2017  8:43 AM Clack, Shanda BumpsJessica D wrote: Reason for CRM: Pt states that he is not feeling any better and was up coughing all last night. He would like to know if Dr. Sherryll BurgerShah could call him in a medication, also he needs a work note to be out Friday thru Sunday.  [facility] [address] - [place], [place] - [phone] (Phone) [phone] (Fax)

## 2017-11-04 NOTE — Telephone Encounter (Signed)
I have sent a prescription for azithromycin 250 mg tablets to his pharmacy. This is likely viral pharyngitis, however his symptoms do not improve by Friday evening, he may start taking azithromycin with the directions indicated.

## 2017-11-04 NOTE — Telephone Encounter (Signed)
Please call patient to inquire about his symptoms, he was having body aches, sore throat and had recommended conservative treatment. If his symptoms have gotten worse he may be started on an antibiotic.

## 2017-11-05 NOTE — Telephone Encounter (Signed)
Pt.notified

## 2017-11-08 ENCOUNTER — Ambulatory Visit: Payer: BLUE CROSS/BLUE SHIELD | Admitting: Family Medicine

## 2017-11-08 ENCOUNTER — Ambulatory Visit
Admission: RE | Admit: 2017-11-08 | Discharge: 2017-11-08 | Disposition: A | Discharge status: Home / Self Care | Payer: BLUE CROSS/BLUE SHIELD | Source: Ambulatory Visit | Attending: Family Medicine | Admitting: Family Medicine

## 2017-11-08 ENCOUNTER — Encounter: Payer: Self-pay | Admitting: Family Medicine

## 2017-11-08 VITALS — BP 114/60 | HR 94 | Temp 98.3°F | Resp 16 | Wt 245.3 lb

## 2017-11-08 DIAGNOSIS — R0602 Shortness of breath: Secondary | ICD-10-CM | POA: Diagnosis not present

## 2017-11-08 DIAGNOSIS — G4733 Obstructive sleep apnea (adult) (pediatric): Secondary | ICD-10-CM

## 2017-11-08 DIAGNOSIS — R05 Cough: Secondary | ICD-10-CM

## 2017-11-08 DIAGNOSIS — J449 Chronic obstructive pulmonary disease, unspecified: Secondary | ICD-10-CM | POA: Diagnosis not present

## 2017-11-08 DIAGNOSIS — R918 Other nonspecific abnormal finding of lung field: Secondary | ICD-10-CM | POA: Insufficient documentation

## 2017-11-08 DIAGNOSIS — R062 Wheezing: Secondary | ICD-10-CM | POA: Insufficient documentation

## 2017-11-08 DIAGNOSIS — R058 Other specified cough: Secondary | ICD-10-CM

## 2017-11-08 LAB — POCT INFLUENZA A/B
Influenza A, POC: NEGATIVE
Influenza B, POC: NEGATIVE

## 2017-11-08 MED ORDER — PREDNISONE 5 MG (21) PO TBPK: ORAL_TABLET | ORAL | 0 refills | Status: DC

## 2017-11-08 MED ORDER — MOXIFLOXACIN HCL 400 MG PO TABS: 400.0000 mg | ORAL_TABLET | Freq: Every day | ORAL | 0 refills | Status: AC

## 2017-11-08 NOTE — Progress Notes (Signed)
Name: Cory Espinoza   MRN: [id number]    DOB: 25-Mar-1971   Date:11/08/2017       Progress Note  Subjective  Chief Complaint  Chief Complaint  Patient presents with  . URI    Chest congestion, SOB, sneezing, body aches and chills but no fever since saturday at 1am.  . forms    sleep study to get a new CPap machine     Cough  This is a recurrent problem. The problem has been unchanged. The cough is productive of sputum and productive of brown sputum. Associated symptoms include chills, shortness of breath and wheezing. Pertinent negatives include no fever, headaches or nasal congestion.   Pt. Has Obstructive Sleep Apnea, last sleep study in 2012, he has been using a CPAP device since then. Recently, his machine has been in need of service and has contacted the sleep med company for a new device because his machine's memory card was not working. His symptoms for sleep apnea included difficulty sleeping, fatigue, weight gain and snoring at night. His wife witnessed apneic episodes.     Past Medical History:  Diagnosis Date  . Allergy   . Anxiety   . COPD (chronic obstructive pulmonary disease) (HCC)   . Coronary artery disease   . Hyperlipidemia   . Hypertension     Past Surgical History:  Procedure Laterality Date  . BACK SURGERY  1996  . CARDIAC CATHETERIZATION Left 04/27/2016   Procedure: Left Heart Cath and Coronary Angiography;  Surgeon: Lamar Blinks, MD;  Location: [facility] INVASIVE CV LAB;  Service: Cardiovascular;  Laterality: Left;  . CARDIAC CATHETERIZATION N/A 04/27/2016   Procedure: Intravascular Pressure Wire/FFR Study;  Surgeon: Alwyn Pea, MD;  Location: [facility] INVAS[facility] CV LAB;  Service: Cardiovascular;  Laterality: N/A;  . CATHETERIZATION OF PULMONARY ARTERY WITH RETRIEVAL OF FOREIGN BODY Bilateral 04/07/2011   heart  . KNEE ARTHROSCOPY Bilateral 2004  . LUMBAR DISC SURGERY  1999    Family History  Problem Relation Age of Onset  . Heart disease Mother   .  Heart disease Father   . Diabetes Sister   . Hypertension Brother   . Cancer Paternal Grandmother        breast  . Diabetes Paternal Grandmother     Social History   Socioeconomic History  . Marital status: Married    Spouse name: Not on file  . Number of children: Not on file  . Years of education: Not on file  . Highest education level: Not on file  Social Needs  . Financial resource strain: Not on file  . Food insecurity - worry: Not on file  . Food insecurity - inability: Not on file  . Transportation needs - medical: Not on file  . Transportation needs - non-medical: Not on file  Occupational History  . Not on file  Tobacco Use  . Smoking status: Former Smoker    Packs/day: 2.00    Years: 23.00    Pack years: 46.00    Types: Cigarettes    Start date: 10/28/1989    Last attempt to quit: 10/28/2012    Years since quitting: 5.0  . Smokeless tobacco: Current User    Types: Snuff  Substance and Sexual Activity  . Alcohol use: Yes    Alcohol/week: 0.0 oz    Comment: rarely  . Drug use: No  . Sexual activity: Yes    Partners: Female  Other Topics Concern  . Not on file  Social History Narrative  .  Not on file     Current Outpatient Medications:  .  albuterol (PROVENTIL HFA;VENTOLIN HFA) 108 (90 Base) MCG/ACT inhaler, Inhale 1-2 puffs into the lungs every 4 (four) hours as needed for wheezing or shortness of breath., Disp: 1 Inhaler, Rfl: 0 .  ALPRAZolam (XANAX) 1 MG tablet, Take 1 tablet (1 mg total) by mouth 4 (four) times daily as needed for anxiety., Disp: 120 tablet, Rfl: 2 .  atorvastatin (LIPITOR) 40 MG tablet, Take 1 tablet (40 mg total) by mouth daily at 6 PM., Disp: 90 tablet, Rfl: 1 .  azithromycin (ZITHROMAX) 250 MG tablet, 2 tabs po day 1, then 1 tab po q day x 4 days., Disp: 6 tablet, Rfl: 0 .  B COMPLEX VITAMINS PO, Take by mouth., Disp: , Rfl:  .  clopidogrel (PLAVIX) 75 MG tablet, Take 1 tablet (75 mg total) by mouth daily., Disp: 90 tablet, Rfl:  1 .  Fluticasone-Umeclidin-Vilant (TRELEGY ELLIPTA) 100-62.5-25 MCG/INH AEPB, Inhale 1 puff into the lungs daily., Disp: 60 each, Rfl: 0 .  meloxicam (MOBIC) 15 MG tablet, Take 1 tablet (15 mg total) by mouth daily., Disp: 30 tablet, Rfl: 3 .  pantoprazole (PROTONIX) 40 MG tablet, Take 1 tablet (40 mg total) by mouth daily., Disp: 90 tablet, Rfl: 0 .  Saw Palmetto 160 MG CAPS, Take by mouth., Disp: , Rfl:  .  sodium chloride (OCEAN) 0.65 % SOLN nasal spray, Place 2 sprays into both nostrils every 2 (two) hours while awake., Disp: , Rfl: 0  Allergies  Allergen Reactions  . Amoxicillin Other (See Comments)  . Aspirin Other (See Comments)    swelling of throat swelling of throat  . Codeine Hives  . Hydrocodone-Acetaminophen Other (See Comments)    itching itching  . Oxycodone-Acetaminophen Nausea And Vomiting  . Oxycodone-Acetaminophen Nausea And Vomiting  . Penicillins Other (See Comments)    unknown unknown     Review of Systems  Constitutional: Positive for chills. Negative for fever.  Respiratory: Positive for cough, shortness of breath and wheezing.   Neurological: Negative for headaches.      Objective  Vitals:   11/08/17 1023  BP: 114/60  Pulse: 94  Resp: 16  Temp: 98.3 F (36.8 C)  TempSrc: Oral  SpO2: 98%  Weight: 245 lb 4.8 oz (111.3 kg)    Physical Exam  Constitutional: He is well-developed, well-nourished, and in no distress.  HENT:  Head: Normocephalic and atraumatic.  Right Ear: Tympanic membrane and ear canal normal.  Left Ear: Tympanic membrane and ear canal normal.  Nose: Right sinus exhibits maxillary sinus tenderness. Right sinus exhibits no frontal sinus tenderness. Left sinus exhibits maxillary sinus tenderness.  Mouth/Throat: Posterior oropharyngeal erythema present.  Cardiovascular: Normal rate, regular rhythm, S1 normal, S2 normal and normal heart sounds.  Pulmonary/Chest: Effort normal and breath sounds normal. No respiratory distress. He  has no decreased breath sounds. He has no wheezes. He has no rhonchi.  Nursing note and vitals reviewed.     Recent Results (from the past 2160 hour(s))  Arthritis Panel     Status: None   Collection Time: 08/12/17 10:16 AM  Result Value Ref Range   Uric Acid 4.4 3.7 - 8.6 mg/dL    Comment:            Therapeutic target for gout patients: <6.0   Anit Nuclear Antibody(ANA) Negative Negative   Rhuematoid fact SerPl-aCnc <10.0 0.0 - 13.9 IU/mL   Sed Rate 5 0 - 15 mm/hr  POCT rapid  strep A     Status: Normal   Collection Time: 11/03/17  3:49 PM  Result Value Ref Range   Rapid Strep A Screen Negative Negative  POCT Influenza A/B     Status: Normal   Collection Time: 11/03/17  3:50 PM  Result Value Ref Range   Influenza A, POC Negative Negative   Influenza B, POC Negative Negative     Assessment & Plan  1. Cough productive of purulent sputum Suspect COPD exacerbation versus bronchitis versus community-acquired pneumonia, will start on Avelox, prednisone 4 possible COPD exacerbation and obtain chest x-ray. Repeat flu testing has been negative - moxifloxacin (AVELOX) 400 MG tablet; Take 1 tablet (400 mg total) by mouth daily at 8 pm for 5 days.  Dispense: 5 tablet; Refill: 0 - predniSONE (STERAPRED UNI-PAK 21 TAB) 5 MG (21) TBPK tablet; 60 50 40 30 20 10  then STOP  Dispense: 21 tablet; Refill: 0 - DG Chest 2 View; Future - POCT Influenza A/B  2. OSA (obstructive sleep apnea) Will review previous sleep study and advise accordingly.   Meilech Virts Asad A. Faylene KurtzShah [facility] 11/08/2017 10:35 AM

## 2017-11-09 ENCOUNTER — Telehealth: Payer: Self-pay

## 2017-11-09 ENCOUNTER — Ambulatory Visit: Payer: BLUE CROSS/BLUE SHIELD | Admitting: Family Medicine

## 2017-11-09 NOTE — Telephone Encounter (Signed)
Letter will be up front for the patient to pick up. Pt has been notified

## 2017-11-09 NOTE — Telephone Encounter (Signed)
Copied from CRM [phone]. 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

## 2017-11-15 ENCOUNTER — Ambulatory Visit: Payer: Self-pay

## 2017-11-15 NOTE — Telephone Encounter (Signed)
  Reason for Disposition . Cough lasts > 3 weeks  Answer Assessment - Initial Assessment Questions 1. SYMPTOMS: "What symptoms are you most concerned about?" "Is it better, the same, or worse compared to when you saw the doctor?"     Cough is worse today 2. BREATHING DIFFICULTY: "Are you having any difficulty breathing?" If so, ask "How bad is it?"  (e.g., none, mild, moderate, severe)   - MILD: No SOB at rest, mild SOB with walking, speaks normally in sentences, can lay down, no retractions, pulse < 100.    - MODERATE: SOB at rest, SOB with minimal exertion and prefers to sit, cannot lie down flat, speaks in phrases, mild retractions, audible wheezing, pulse 100-120.    - SEVERE: Very SOB at rest, speaks in single words, struggling to breathe, sitting hunched forward, retractions, pulse > 120      Mild 3. FEVER: "Do you have a fever?" If so, ask: "What is your temperature, how was it measured, and when did it start?"     99.4 oral 4. SPUTUM: "Describe the color of your sputum" (clear, white, yellow, green, blood-tinged)     Brownish - green 5. DIAGNOSIS CONFIRMATION: "When was the pneumonia diagnosed?" "By whom?"     Yes 6. ANTIBIOTIC: "Are you taking an antibiotic?"  If so, "Which one?" "When was it started?"     Finished 7. OTHER TREATMENT: "Are you receiving any other treatment for the pneumonia?" (e.g., albuterol nebulizer, oxygen) If so, ask, "How often?" and "Do they help?"     Took antibiotic and prednisone 8. HOSPITAL ADMISSION: "Were you hospitalized for this pneumonia?" If so, ask "When were you discharged home from the hospital?"     No  Protocols used: PNEUMONIA ON ANTIBIOTIC FOLLOW-UP CALL-A-AH Pt. Has appointment for tomorrow with Dr.Shah. Will take OTC for symptom relief today. Will go to urgent care if he feels worse.

## 2017-11-16 ENCOUNTER — Ambulatory Visit: Payer: Self-pay | Admitting: Emergency Medicine

## 2017-11-16 ENCOUNTER — Ambulatory Visit: Payer: Self-pay | Admitting: Family Medicine

## 2017-11-16 VITALS — BP 120/80 | HR 84 | Temp 98.1°F

## 2017-11-16 DIAGNOSIS — J181 Lobar pneumonia, unspecified organism: Principal | ICD-10-CM

## 2017-11-16 DIAGNOSIS — J189 Pneumonia, unspecified organism: Secondary | ICD-10-CM

## 2017-11-16 NOTE — Progress Notes (Signed)
S:  Dx with pneumonia. Has taken 2 rounds of antibiotics and prednisone. He continues to cough mostly at night but  is improving. His only concern is working as he does get short of breath. He states he has an appointment with his PCP today but was contacted last evening to cancel this appointment is the office would be closed today due to weather.   O:  Lungs  with good breath sounds bilaterally. No wheezing or rales noted. There is an occasional coarse cough but patient is able to speak in complete sentences.  Heart RRR. A:  Resolving pneumonia P:  Patient has completed 2 rounds of an antibiotic. The first being Zithromax and the quialone.  He was given a RX for Tussionex 6 oz.  Also a note to remain out of work for 2 days to give him time to follow up with his PCP

## 2017-11-18 ENCOUNTER — Ambulatory Visit
Admission: RE | Admit: 2017-11-18 | Discharge: 2017-11-18 | Disposition: A | Discharge status: Home / Self Care | Payer: BLUE CROSS/BLUE SHIELD | Source: Ambulatory Visit | Attending: Family Medicine | Admitting: Family Medicine

## 2017-11-18 ENCOUNTER — Ambulatory Visit: Payer: BLUE CROSS/BLUE SHIELD | Admitting: Family Medicine

## 2017-11-18 ENCOUNTER — Telehealth: Payer: Self-pay

## 2017-11-18 ENCOUNTER — Encounter: Payer: Self-pay | Admitting: Family Medicine

## 2017-11-18 VITALS — BP 108/78 | HR 107 | Temp 97.4°F | Ht 74.0 in | Wt 238.6 lb

## 2017-11-18 DIAGNOSIS — R0609 Other forms of dyspnea: Principal | ICD-10-CM

## 2017-11-18 DIAGNOSIS — R0600 Dyspnea, unspecified: Secondary | ICD-10-CM

## 2017-11-18 DIAGNOSIS — R05 Cough: Secondary | ICD-10-CM | POA: Diagnosis not present

## 2017-11-18 DIAGNOSIS — R0602 Shortness of breath: Secondary | ICD-10-CM | POA: Diagnosis not present

## 2017-11-18 DIAGNOSIS — J209 Acute bronchitis, unspecified: Secondary | ICD-10-CM

## 2017-11-18 MED ORDER — PREDNISONE 10 MG (21) PO TBPK: ORAL_TABLET | ORAL | 0 refills | Status: DC

## 2017-11-18 MED ORDER — ALBUTEROL SULFATE HFA 108 (90 BASE) MCG/ACT IN AERS: 1.0000 | INHALATION_SPRAY | RESPIRATORY_TRACT | 0 refills | Status: DC | PRN

## 2017-11-18 NOTE — Telephone Encounter (Signed)
Copied from CRM [id number]. Topic: Inquiry >> Nov 18, 2017 11:19 AM Windy KalataMichael, Taylor L, NT wrote: Reason for CRM: patient wants Dr. Sherryll BurgerShah nurse to call him and he states he had an appt 12/10 and he says he feels better and would like to know if he can get a work note stating he can go back to work tomorrow. He did not want to reschedule appt he just wants the nurse to give him a call. Thank you

## 2017-11-18 NOTE — Progress Notes (Signed)
Name: Cory GriefJoseph D Mcclenny   MRN: [id number]    DOB: 12/03/1971   Date:11/18/2017       Progress Note  Subjective  Chief Complaint  Chief Complaint  Patient presents with  . Cough    Pt states he is still not better, coughing up brownish mucus  . Shortness of Breath    chest feels tight with dyspnea, Denies chest pain   . Sore Throat  . Dizziness    HPI  Patient returns for follow-up of pneumonia and persistent symptoms of coughing, dyspnea on exertion, hoarseness, and fatigue. He was treated for pneumonia with 2 rounds of antibiotics, prednisone for concurrent treatment of bronchitis, he reports getting up quickly out of breath when exerting, notes that his coughing has flared up, feels fatigued. He works [employer] and his supervisors have informed him that he must obtain his doctor's approval before returning to work full-time.  Past Medical History:  Diagnosis Date  . Allergy   . Anxiety   . COPD (chronic obstructive pulmonary disease) (HCC)   . Coronary artery disease   . Hyperlipidemia   . Hypertension     Past Surgical History:  Procedure Laterality Date  . BACK SURGERY  1996  . CARDIAC CATHETERIZATION Left 04/27/2016   Procedure: Left Heart Cath and Coronary Angiography;  Surgeon: Lamar BlinksBruce J Kowalski, MD;  Location: [facility] INVASIVE CV LAB;  Service: Cardiovascular;  Laterality: Left;  . CARDIAC CATHETERIZATION N/A 04/27/2016   Procedure: Intravascular Pressure Wire/FFR Study;  Surgeon: Alwyn Peawayne D Callwood, MD;  Location: [facility] INVASIVE CV LAB;  Service: Cardiovascular;  Laterality: N/A;  . CATHETERIZATION OF PULMONARY ARTERY WITH RETRIEVAL OF FOREIGN BODY Bilateral 04/07/2011   heart  . KNEE ARTHROSCOPY Bilateral 2004  . LUMBAR DISC SURGERY  1999    Family History  Problem Relation Age of Onset  . Heart disease Mother   . Heart disease Father   . Diabetes Sister   . Hypertension Brother   . Cancer Paternal Grandmother        breast  . Diabetes Paternal  Grandmother     Social History   Socioeconomic History  . Marital status: Married    Spouse name: Not on file  . Number of children: Not on file  . Years of education: Not on file  . Highest education level: Not on file  Social Needs  . Financial resource strain: Not on file  . Food insecurity - worry: Not on file  . Food insecurity - inability: Not on file  . Transportation needs - medical: Not on file  . Transportation needs - non-medical: Not on file  Occupational History  . Not on file  Tobacco Use  . Smoking status: Former Smoker    Packs/day: 2.00    Years: 23.00    Pack years: 46.00    Types: Cigarettes    Start date: 10/28/1989    Last attempt to quit: 10/28/2012    Years since quitting: 5.0  . Smokeless tobacco: Current User    Types: Snuff  Substance and Sexual Activity  . Alcohol use: Yes    Alcohol/week: 0.0 oz    Comment: rarely  . Drug use: No  . Sexual activity: Yes    Partners: Female  Other Topics Concern  . Not on file  Social History Narrative  . Not on file     Current Outpatient Medications:  .  albuterol (PROVENTIL HFA;VENTOLIN HFA) 108 (90 Base) MCG/ACT inhaler, Inhale 1-2 puffs into the lungs  every 4 (four) hours as needed for wheezing or shortness of breath., Disp: 1 Inhaler, Rfl: 0 .  ALPRAZolam (XANAX) 1 MG tablet, Take 1 tablet (1 mg total) by mouth 4 (four) times daily as needed for anxiety., Disp: 120 tablet, Rfl: 2 .  atorvastatin (LIPITOR) 40 MG tablet, Take 1 tablet (40 mg total) by mouth daily at 6 PM., Disp: 90 tablet, Rfl: 1 .  B COMPLEX VITAMINS PO, Take by mouth., Disp: , Rfl:  .  clopidogrel (PLAVIX) 75 MG tablet, Take 1 tablet (75 mg total) by mouth daily., Disp: 90 tablet, Rfl: 1 .  Fluticasone-Umeclidin-Vilant (TRELEGY ELLIPTA) 100-62.5-25 MCG/INH AEPB, Inhale 1 puff into the lungs daily., Disp: 60 each, Rfl: 0 .  meloxicam (MOBIC) 15 MG tablet, Take 1 tablet (15 mg total) by mouth daily., Disp: 30 tablet, Rfl: 3 .   pantoprazole (PROTONIX) 40 MG tablet, Take 1 tablet (40 mg total) by mouth daily., Disp: 90 tablet, Rfl: 0 .  Saw Palmetto 160 MG CAPS, Take by mouth., Disp: , Rfl:  .  predniSONE (STERAPRED UNI-PAK 21 TAB) 5 MG (21) TBPK tablet, 60 50 40 30 20 10  then STOP (Patient not taking: Reported on 11/16/2017), Disp: 21 tablet, Rfl: 0 .  sodium chloride (OCEAN) 0.65 % SOLN nasal spray, Place 2 sprays into both nostrils every 2 (two) hours while awake., Disp: , Rfl: 0  Allergies  Allergen Reactions  . Amoxicillin Other (See Comments)  . Aspirin Other (See Comments)    swelling of throat swelling of throat  . Codeine Hives  . Hydrocodone-Acetaminophen Other (See Comments)    itching itching  . Oxycodone-Acetaminophen Nausea And Vomiting  . Oxycodone-Acetaminophen Nausea And Vomiting  . Penicillins Other (See Comments)    unknown unknown     ROS  Please see history of present illness for complete discussion of ROS  Objective  Vitals:   11/18/17 1311  BP: 108/78  Pulse: (!) 107  Temp: (!) 97.4 F (36.3 C)  TempSrc: Oral  SpO2: 96%  Weight: 238 lb 9.6 oz (108.2 kg)  Height: 6\' 2"  (1.88 m)    Physical Exam  Cardiovascular: Normal rate and regular rhythm.  Pulmonary/Chest: Effort normal. He has decreased breath sounds in the right middle field, the right lower field, the left middle field and the left lower field.  Nursing note and vitals reviewed.    Recent Results (from the past 2160 hour(s))  POCT rapid strep A     Status: Normal   Collection Time: 11/03/17  3:49 PM  Result Value Ref Range   Rapid Strep A Screen Negative Negative  POCT Influenza A/B     Status: Normal   Collection Time: 11/03/17  3:50 PM  Result Value Ref Range   Influenza A, POC Negative Negative   Influenza B, POC Negative Negative  POCT Influenza A/B     Status: Normal   Collection Time: 11/08/17 11:17 AM  Result Value Ref Range   Influenza A, POC Negative Negative   Influenza B, POC Negative  Negative     Assessment & Plan  1. Dyspnea on exertion Suspect a flareup of COPD, patient's oxygen saturation dropped on walking at a regular pace in our clinic. It started with 97% and dropped to 94% of the time he finished. We will obtain chest x-ray for evaluation - DG Chest 2 View; Future  2. Acute bronchitis, unspecified organism Suspect concurrent bronchitis with COPD exacerbation symptoms. We'll start on a second round of prednisone, obtain chest  x-ray and will follow. Patient provided a note to stay out of work until Sunday, 11/21/2017. - albuterol (PROVENTIL HFA;VENTOLIN HFA) 108 (90 Base) MCG/ACT inhaler; Inhale 1-2 puffs into the lungs every 4 (four) hours as needed for wheezing or shortness of breath.  Dispense: 1 Inhaler; Refill: 0 - predniSONE (STERAPRED UNI-PAK 21 TAB) 10 MG (21) TBPK tablet; 60 50 40 30 20 10  then STOP  Dispense: 21 tablet; Refill: 0    Emmagrace Runkel Asad A. Faylene KurtzShah [facility] 11/18/2017 1:25 PM

## 2017-11-22 ENCOUNTER — Ambulatory Visit: Payer: BLUE CROSS/BLUE SHIELD | Admitting: Family Medicine

## 2017-11-22 ENCOUNTER — Encounter: Payer: Self-pay | Admitting: Family Medicine

## 2017-11-22 VITALS — BP 122/68 | HR 82 | Temp 97.6°F | Resp 16 | Ht 74.0 in | Wt 240.8 lb

## 2017-11-22 DIAGNOSIS — R058 Other specified cough: Secondary | ICD-10-CM

## 2017-11-22 DIAGNOSIS — R0609 Other forms of dyspnea: Secondary | ICD-10-CM | POA: Diagnosis not present

## 2017-11-22 DIAGNOSIS — R05 Cough: Secondary | ICD-10-CM

## 2017-11-22 DIAGNOSIS — R0600 Dyspnea, unspecified: Secondary | ICD-10-CM

## 2017-11-22 DIAGNOSIS — J449 Chronic obstructive pulmonary disease, unspecified: Secondary | ICD-10-CM

## 2017-11-22 NOTE — Patient Instructions (Addendum)
Dr. Belia HemanKasa (Pulmonologist) for 11/25/2017 at 9:00am ([facility] Entr[facility], go down to the first floor - [facility] with [facility])  Chronic Obstructive Pulmonary Disease Chronic obstructive pulmonary disease (COPD) is a long-term (chronic) lung problem. When you have COPD, it is hard for air to get in and out of your lungs. The way your lungs work will never return to normal. Usually the condition gets worse over time. There are things you can do to keep yourself as healthy as possible. Your doctor may treat your condition with:  Medicines.  Quitting smoking, if you smoke.  Rehabilitation. This may involve a team of specialists.  Oxygen.  Exercise and changes to your diet.  Lung surgery.  Comfort measures (palliative care).  Follow these instructions at home: Medicines  Take over-the-counter and prescription medicines only as told by your doctor.  Talk to your doctor before taking any cough or allergy medicines. You may need to avoid medicines that cause your lungs to be dry. Lifestyle  If you smoke, stop. Smoking makes the problem worse. If you need help quitting, ask your doctor.  Avoid being around things that make your breathing worse. This may include smoke, chemicals, and fumes.  Stay active, but remember to also rest.  Learn and use tips on how to relax.  Make sure you get enough sleep. Most adults need at least 7 hours a night.  Eat healthy foods. Eat smaller meals more often. Rest before meals. Controlled breathing  Learn and use tips on how to control your breathing as told by your doctor. Try: ? Breathing in (inhaling) through your nose for 1 second. Then, pucker your lips and breath out (exhale) through your lips for 2 seconds. ? Putting one hand on your belly (abdomen). Breathe in slowly through your nose for 1 second. Your hand on your belly should move out. Pucker your lips and breathe out slowly through your lips. Your hand on your belly should  move in as you breathe out. Controlled coughing  Learn and use controlled coughing to clear mucus from your lungs. The steps are: 1. Lean your head a little forward. 2. Breathe in deeply. 3. Try to hold your breath for 3 seconds. 4. Keep your mouth slightly open while coughing 2 times. 5. Spit any mucus out into a tissue. 6. Rest and do the steps again 1 or 2 times as needed. General instructions  Make sure you get all the shots (vaccines) that your doctor recommends. Ask your doctor about a flu shot and a pneumonia shot.  Use oxygen therapy and therapy to help improve your lungs (pulmonary rehabilitation) if told by your doctor. If you need home oxygen therapy, ask your doctor if you should buy a tool to measure your oxygen level (oximeter).  Make a COPD action plan with your doctor. This helps you know what to do if you feel worse than usual.  Manage any other conditions you have as told by your doctor.  Avoid going outside when it is very hot, cold, or humid.  Avoid people who have a sickness you can catch (contagious).  Keep all follow-up visits as told by your doctor. This is important. Contact a doctor if:  You cough up more mucus than usual.  There is a change in the color or thickness of the mucus.  It is harder to breathe than usual.  Your breathing is faster than usual.  You have trouble sleeping.  You need to use your medicines more often than  usual.  You have trouble doing your normal activities such as getting dressed or walking around the house. Get help right away if:  You have shortness of breath while resting.  You have shortness of breath that stops you from: ? Being able to talk. ? Doing normal activities.  Your chest hurts for longer than 5 minutes.  Your skin color is more blue than usual.  Your pulse oximeter shows that you have low oxygen for longer than 5 minutes.  You have a fever.  You feel too tired to breathe  normally. Summary  Chronic obstructive pulmonary disease (COPD) is a long-term lung problem.  The way your lungs work will never return to normal. Usually the condition gets worse over time. There are things you can do to keep yourself as healthy as possible.  Take over-the-counter and prescription medicines only as told by your doctor.  If you smoke, stop. Smoking makes the problem worse. This information is not intended to replace advice given to you by your health care provider. Make sure you discuss any questions you have with your health care provider. Document Released: 05/11/2008 Document Revised: 04/30/2016 Document Reviewed: 07/20/2013 Elsevier Interactive Patient Education  2017 [employer]  Shortness of Breath, Adult Shortness of breath means you have trouble breathing. Your lungs are organs for breathing. Follow these instructions at home: Pay attention to any changes in your symptoms. Take these actions to help with your condition:  Do not smoke. Smoking can cause shortness of breath. If you need help to quit smoking, ask your doctor.  Avoid things that can make it harder to breathe, such as: ? Mold. ? Dust. ? Air pollution. ? Chemical smells. ? Things that can cause allergy symptoms (allergens), if you have allergies.  Keep your living space clean and free of mold and dust.  Rest as needed. Slowly return to your usual activities.  Take over-the-counter and prescription medicines, including oxygen and inhaled medicines, only as told by your doctor.  Keep all follow-up visits as told by your doctor. This is important.  Contact a doctor if:  Your condition does not get better as soon as expected.  You have a hard time doing your normal activities, even after you rest.  You have new symptoms. Get help right away if:  You have trouble breathing when you are resting.  You feel light-headed or you faint.  You have a cough that is not helped by  medicines.  You cough up blood.  You have pain with breathing.  You have pain in your chest, arms, shoulders, or belly (abdomen).  You have a fever.  You cannot walk up stairs.  You cannot exercise the way you normally do. This information is not intended to replace advice given to you by your health care provider. Make sure you discuss any questions you have with your health care provider. Document Released: 05/11/2008 Document Revised: 12/10/2016 Document Reviewed: 12/10/2016 Elsevier Interactive Patient Education  2017 [employer]

## 2017-11-22 NOTE — Progress Notes (Signed)
Name: Cory Espinoza   MRN: [id number]    DOB: 11/16/1971   Date:11/22/2017       Progress Note  Subjective  Chief Complaint  Chief Complaint  Patient presents with  . Shortness of Breath    follow up on SOB and low pulse rate. Pt has been recording his walking rates on 11/13/2017-11/22/2017. Still get little lightheaded when moving , sitting still is fine. Couhing is better. All of the symptoms are better but he still have lightheadness, lingeriing cough and SOB.     HPI  Pt presents for follow up on COPD exacerbation/dyspnea on exertion.  Was seen by PCP Dr. Sherryll BurgerShah 11/18/2017 for ongoing DOE after being treated for RML PNA with Azithromycin and Moxifloxacin; has also been on two different rounds of oral prednisone for concurrent bronchitis. Most recent chest Xray on 11/18/2017 was negative (resolution from 11/08/17). He has Albuterol inhaler for PRN use and Trelegy & Symbicort for daily use.  Ongoing symptoms include cough, significant shortness of breath with exertion (needing inhaler frequently).  Former smoker - quit 6-7 years ago; Smoked about 20 years about 2.5ppd.  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    - He has been checking his Pulse-Ox on an app on his phone over the last week - reports range of 91-97%. - Has tried tessalon in the past, made him nauseous; allergic to codeine.  Patient Active Problem List   Diagnosis Date Noted  . Cough productive of purulent sputum 11/08/2017  . Pharyngitis 11/04/2017  . GERD (gastroesophageal reflux disease) 05/06/2017  . Contusion of hand 04/22/2017  . BP (high blood pressure) 04/22/2017  . Oxygen desaturation 04/22/2017  . Prostate lump 04/22/2017  . Unstable angina (HCC) 04/27/2016  . Chest pain, unspecified 04/24/2016  . Obesity 01/29/2016  . Elevated ALT measurement 10/30/2015  . Nonspecific elevation of  levels of transaminase and lactic acid dehydrogenase (ldh) 10/30/2015  . Reduced libido 10/29/2015  . Hyperlipidemia 08/06/2015  . Anxiety 08/06/2015  . Atherosclerosis of coronary artery 08/06/2015  . Childhood asthma 08/06/2015  . Chronic obstructive pulmonary disease (HCC) 08/06/2015  . OSA (obstructive sleep apnea) 08/06/2015  . Anxiety disorder, unspecified 08/06/2015  . Atherosclerotic heart disease of native coronary artery without angina pectoris 08/06/2015  . Uncomplicated asthma 08/06/2015    Social History   Tobacco Use  . Smoking status: Former Smoker    Packs/day: 2.00    Years: 23.00    Pack years: 46.00    Types: Cigarettes    Start date: 10/28/1989    Last attempt to quit: 10/28/2012    Years since quitting: 5.0  . Smokeless tobacco: Current User    Types: Snuff  Substance Use Topics  . Alcohol use: Yes    Alcohol/week: 0.0 oz    Comment: rarely     Current Outpatient Medications:  .  albuterol (PROVENTIL HFA;VENTOLIN HFA) 108 (90 Base) MCG/ACT inhaler, Inhale 1-2 puffs into the lungs every 4 (four) hours as needed for wheezing or shortness of breath., Disp: 1 Inhaler, Rfl: 0 .  ALPRAZolam (XANAX) 1 MG tablet, Take 1 tablet (1 mg total) by mouth 4 (four) times daily as needed for anxiety., Disp: 120 tablet, Rfl: 2 .  atorvastatin (LIPITOR) 40 MG tablet, Take 1 tablet (40 mg total) by mouth daily at 6 PM., Disp: 90 tablet, Rfl: 1 .  B  COMPLEX VITAMINS PO, Take by mouth., Disp: , Rfl:  .  clopidogrel (PLAVIX) 75 MG tablet, Take 1 tablet (75 mg total) by mouth daily., Disp: 90 tablet, Rfl: 1 .  Fluticasone-Umeclidin-Vilant (TRELEGY ELLIPTA) 100-62.5-25 MCG/INH AEPB, Inhale 1 puff into the lungs daily., Disp: 60 each, Rfl: 0 .  meloxicam (MOBIC) 15 MG tablet, Take 1 tablet (15 mg total) by mouth daily., Disp: 30 tablet, Rfl: 3 .  pantoprazole (PROTONIX) 40 MG tablet, Take 1 tablet (40 mg total) by mouth daily., Disp: 90 tablet, Rfl: 0 .  predniSONE (STERAPRED  UNI-PAK 21 TAB) 10 MG (21) TBPK tablet, 60 50 40 30 20 10  then STOP, Disp: 21 tablet, Rfl: 0 .  Saw Palmetto 160 MG CAPS, Take by mouth., Disp: , Rfl:  .  sodium chloride (OCEAN) 0.65 % SOLN nasal spray, Place 2 sprays into both nostrils every 2 (two) hours while awake., Disp: , Rfl: 0  Allergies  Allergen Reactions  . Amoxicillin Other (See Comments)  . Aspirin Other (See Comments)    swelling of throat swelling of throat  . Codeine Hives  . Hydrocodone-Acetaminophen Other (See Comments)    itching itching  . Oxycodone-Acetaminophen Nausea And Vomiting  . Oxycodone-Acetaminophen Nausea And Vomiting  . Penicillins Other (See Comments)    unknown unknown    ROS  Constitutional: Negative for fever or weight change.  Respiratory: Negative for cough and shortness of breath.   Cardiovascular: Negative for chest pain or palpitations.  Gastrointestinal: Negative for abdominal pain, no bowel changes.  Musculoskeletal: Negative for gait problem or joint swelling.  Skin: Negative for rash.  Neurological: Negative for dizziness or headache.  No other specific complaints in a complete review of systems (except as listed in HPI above).  Objective  Vitals:   11/22/17 1051  BP: 122/68  Pulse: 82  Resp: 16  Temp: 97.6 F (36.4 C)  TempSrc: Oral  SpO2: 97%  Weight: 240 lb 12.8 oz (109.2 kg)  Height: 6\' 2"  (1.88 m)   - Ambulatory Pulse-Ox 98% and stable, however, pt does become short of breath and begins coughing with ambulation.  Recovers with rest.  Body mass index is 30.92 kg/m.  Nursing Note and Vital Signs reviewed.  Physical Exam  Constitutional: Patient appears well-developed and well-nourished. Obese No distress.  HEENT: head atraumatic, normocephalic, neck supple without lymphadenopathy, oropharynx pink and moist without exudate Cardiovascular: Normal rate, regular rhythm, S1/S2 present.  No murmur or rub heard. No BLE edema. Pulmonary/Chest: Effort normal and breath  sounds clear but diminished throughout. No respiratory distress or retractions. Psychiatric: Patient has a normal mood and affect. behavior is normal. Judgment and thought content normal.  Recent Results (from the past 2160 hour(s))  POCT rapid strep A     Status: Normal   Collection Time: 11/03/17  3:49 PM  Result Value Ref Range   Rapid Strep A Screen Negative Negative  POCT Influenza A/B     Status: Normal   Collection Time: 11/03/17  3:50 PM  Result Value Ref Range   Influenza A, POC Negative Negative   Influenza B, POC Negative Negative  POCT Influenza A/B     Status: Normal   Collection Time: 11/08/17 11:17 AM  Result Value Ref Range   Influenza A, POC Negative Negative   Influenza B, POC Negative Negative     Assessment & Plan  1. Chronic obstructive pulmonary disease, unspecified COPD type (HCC) 2. Cough productive of purulent sputum 3. Dyspnea on exertion - Ambulatory referral  to Pulmonology - Appointment is made with Dr. Belia HemanKasa for 11/25/2017 at 9:00am  - Note provided that pt must obtain Pulmonologist approval prior to returning to work. -Red flags and when to present for emergency care or RTC including fever >101.43F, chest pain, shortness of breath unrelieved by inhaler and rest, new/worsening/un-resolving symptoms, reviewed with patient at time of visit. Follow up and care instructions discussed and provided in AVS.

## 2017-11-25 ENCOUNTER — Encounter: Payer: Self-pay | Admitting: Emergency Medicine

## 2017-11-25 ENCOUNTER — Emergency Department: Payer: BLUE CROSS/BLUE SHIELD

## 2017-11-25 ENCOUNTER — Encounter: Payer: Self-pay | Admitting: Internal Medicine

## 2017-11-25 ENCOUNTER — Emergency Department
Admission: EM | Admit: 2017-11-25 | Discharge: 2017-11-25 | Disposition: A | Discharge status: Home / Self Care | Payer: BLUE CROSS/BLUE SHIELD | Attending: Emergency Medicine | Admitting: Emergency Medicine

## 2017-11-25 ENCOUNTER — Other Ambulatory Visit: Payer: Self-pay

## 2017-11-25 ENCOUNTER — Ambulatory Visit: Payer: BLUE CROSS/BLUE SHIELD | Admitting: Internal Medicine

## 2017-11-25 ENCOUNTER — Encounter: Payer: Self-pay | Admitting: *Deleted

## 2017-11-25 VITALS — BP 110/70 | HR 91 | Ht 74.0 in | Wt 243.0 lb

## 2017-11-25 DIAGNOSIS — I10 Essential (primary) hypertension: Secondary | ICD-10-CM | POA: Insufficient documentation

## 2017-11-25 DIAGNOSIS — R0602 Shortness of breath: Secondary | ICD-10-CM | POA: Insufficient documentation

## 2017-11-25 DIAGNOSIS — J449 Chronic obstructive pulmonary disease, unspecified: Secondary | ICD-10-CM | POA: Diagnosis not present

## 2017-11-25 DIAGNOSIS — R0789 Other chest pain: Secondary | ICD-10-CM | POA: Insufficient documentation

## 2017-11-25 DIAGNOSIS — Z79899 Other long term (current) drug therapy: Secondary | ICD-10-CM | POA: Insufficient documentation

## 2017-11-25 DIAGNOSIS — I2510 Atherosclerotic heart disease of native coronary artery without angina pectoris: Secondary | ICD-10-CM | POA: Diagnosis not present

## 2017-11-25 DIAGNOSIS — I208 Other forms of angina pectoris: Secondary | ICD-10-CM

## 2017-11-25 DIAGNOSIS — J441 Chronic obstructive pulmonary disease with (acute) exacerbation: Secondary | ICD-10-CM

## 2017-11-25 DIAGNOSIS — Z7901 Long term (current) use of anticoagulants: Secondary | ICD-10-CM | POA: Insufficient documentation

## 2017-11-25 DIAGNOSIS — R059 Cough, unspecified: Secondary | ICD-10-CM

## 2017-11-25 DIAGNOSIS — R05 Cough: Secondary | ICD-10-CM | POA: Insufficient documentation

## 2017-11-25 LAB — CBC
HCT: 50.3 % (ref 40.0–52.0)
Hemoglobin: 17.4 g/dL (ref 13.0–18.0)
MCH: 32.7 pg (ref 26.0–34.0)
MCHC: 34.5 g/dL (ref 32.0–36.0)
MCV: 94.8 fL (ref 80.0–100.0)
Platelets: 236 10*3/uL (ref 150–440)
RBC: 5.31 MIL/uL (ref 4.40–5.90)
RDW: 13.3 % (ref 11.5–14.5)
WBC: 7.3 10*3/uL (ref 3.8–10.6)

## 2017-11-25 LAB — BASIC METABOLIC PANEL
Anion gap: 6 (ref 5–15)
BUN: 15 mg/dL (ref 6–20)
CO2: 27 mmol/L (ref 22–32)
Calcium: 9.3 mg/dL (ref 8.9–10.3)
Chloride: 104 mmol/L (ref 101–111)
Creatinine, Ser: 0.92 mg/dL (ref 0.61–1.24)
GFR calc Af Amer: >60 mL/min (ref >60)
GFR calc non Af Amer: >60 mL/min (ref >60)
Glucose, Bld: 97 mg/dL (ref 65–99)
Potassium: 3.7 mmol/L (ref 3.5–5.1)
Sodium: 137 mmol/L (ref 135–145)

## 2017-11-25 LAB — FIBRIN DERIVATIVES D-DIMER (ARMC ONLY): Fibrin derivatives D-dimer (ARMC): 166.98 ng/mL (FEU) (ref 0.00–499.00)

## 2017-11-25 LAB — TROPONIN I: Troponin I: <0.03 ng/mL (ref <0.03)

## 2017-11-25 MED ORDER — BENZONATATE 100 MG PO CAPS: 100.0000 mg | ORAL_CAPSULE | Freq: Four times a day (QID) | ORAL | 0 refills | Status: DC | PRN

## 2017-11-25 MED ORDER — ACETYLCYSTEINE 20 % IN SOLN
4.0000 mL | Freq: Once | RESPIRATORY_TRACT | Status: AC
  Administered 2017-11-25: 4 mL via RESPIRATORY_TRACT
  Filled 2017-11-25: qty 4

## 2017-11-25 MED ORDER — BENZONATATE 100 MG PO CAPS: 100.0000 mg | ORAL_CAPSULE | Freq: Three times a day (TID) | ORAL | 1 refills | Status: DC | PRN

## 2017-11-25 MED ORDER — IPRATROPIUM-ALBUTEROL 0.5-2.5 (3) MG/3ML IN SOLN
3.0000 mL | Freq: Once | RESPIRATORY_TRACT | Status: AC
  Administered 2017-11-25: 3 mL via RESPIRATORY_TRACT

## 2017-11-25 MED ORDER — PREDNISONE 20 MG PO TABS: 40.0000 mg | ORAL_TABLET | Freq: Every day | ORAL | 0 refills | Status: DC

## 2017-11-25 MED ORDER — IPRATROPIUM-ALBUTEROL 0.5-2.5 (3) MG/3ML IN SOLN: 3.0000 mL | RESPIRATORY_TRACT | 3 refills | Status: DC | PRN

## 2017-11-25 MED ORDER — METHYLPREDNISOLONE ACETATE 80 MG/ML IJ SUSP
120.0000 mg | Freq: Once | INTRAMUSCULAR | Status: AC
  Administered 2017-11-25: 120 mg via INTRAMUSCULAR

## 2017-11-25 NOTE — ED Triage Notes (Signed)
Pt sent from pulmonary office.  Has had SHOB since beginning of December. abx with steroid X 2, once for bronchitis and most recently PNA.  SHOB has been worse.  duoneb and 120 mg depo-medrol. Mildly labored.

## 2017-11-25 NOTE — Addendum Note (Signed)
Addended by: Meyer CoryAHMAD, MISTY R on: 11/25/2017 02:59 PM   Modules accepted: Orders

## 2017-11-25 NOTE — Discharge Instructions (Signed)
Please seek medical attention for any high fevers, chest pain, shortness of breath, change in behavior, persistent vomiting, bloody stool or any other new or concerning symptoms.  

## 2017-11-25 NOTE — Addendum Note (Signed)
Addended by: Meyer CoryAHMAD, MISTY R on: 11/25/2017 10:08 AM   Modules accepted: Orders

## 2017-11-25 NOTE — ED Notes (Signed)
Patient ambulated around department. SpO2 prior to walk, sitting on the side of the bed is 99%. Towards the end of the walk SpO2 94%, work of breathing increased. Once patient sat back down and took some deep breaths, work of breathing decreased. Patient reports dizziness upon standing.

## 2017-11-25 NOTE — Patient Instructions (Signed)
TRANSFER PATIENT TO ER

## 2017-11-25 NOTE — ED Provider Notes (Signed)
[facility] Emergency Department Provider Note  ____________________________________________   I have reviewed the triage vital signs and the nursing notes.   HISTORY  Chief Complaint Chest Pain   History limited by: Not Limited   HPI Cory Espinoza is a 46 y.o. male who presents to the emergency department today at the suggestion of his pulmonologist because of chest pressure.   LOCATION:central chest DURATION:days TIMING: constant QUALITY: pressure CONTEXT: patient states that for the past month he has been having problems with his breathing and coughing. Had been diagnosed with COPD then PNA. Was put on antibiotics and multiple rounds of steroids. Was told the PNA was improved but has continued to have cough. This has been accompanied by what he describes as chest pressure. Denies chest pain. MODIFYING FACTORS: none ASSOCIATED SYMPTOMS: shortness of breath  Per medical record review patient has a history of COPD, [name].   Past Medical History:  Diagnosis Date  . Allergy   . Anxiety   . COPD (chronic obstructive pulmonary disease) (HCC)   . Coronary artery disease   . Hyperlipidemia   . Hypertension     Patient Active Problem List   Diagnosis Date Noted  . Cough productive of purulent sputum 11/08/2017  . Pharyngitis 11/04/2017  . GERD (gastroesophageal reflux disease) 05/06/2017  . Contusion of hand 04/22/2017  . BP (high blood pressure) 04/22/2017  . Oxygen desaturation 04/22/2017  . Prostate lump 04/22/2017  . Unstable angina (HCC) 04/27/2016  . Chest pain, unspecified 04/24/2016  . Obesity 01/29/2016  . Elevated ALT measurement 10/30/2015  . Nonspecific elevation of levels of transaminase and lactic acid dehydrogenase (ldh) 10/30/2015  . Reduced libido 10/29/2015  . Hyperlipidemia 08/06/2015  . Anxiety 08/06/2015  . Atherosclerosis of coronary artery 08/06/2015  . Childhood asthma 08/06/2015  . Chronic obstructive pulmonary  disease (HCC) 08/06/2015  . OSA (obstructive sleep apnea) 08/06/2015  . Anxiety disorder, unspecified 08/06/2015  . Atherosclerotic heart disease of native coronary artery without angina pectoris 08/06/2015  . Uncomplicated asthma 08/06/2015    Past Surgical History:  Procedure Laterality Date  . BACK SURGERY  1996  . CARDIAC CATHETERIZATION Left 04/27/2016   Procedure: Left Heart Cath and Coronary Angiography;  Surgeon: Lamar BlinksBruce J Kowalski, MD;  Location: [facility] INVASIVE CV LAB;  Service: Cardiovascular;  Laterality: Left;  . CARDIAC CATHETERIZATION N/A 04/27/2016   Procedure: Intravascular Pressure Wire/FFR Study;  Surgeon: Alwyn Peawayne D Callwood, MD;  Location: [facility] INVASIVE CV LAB;  Service: Cardiovascular;  Laterality: N/A;  . CATHETERIZATION OF PULMONARY ARTERY WITH RETRIEVAL OF FOREIGN BODY Bilateral 04/07/2011   heart  . KNEE ARTHROSCOPY Bilateral 2004  . LUMBAR DISC SURGERY  1999    Prior to Admission medications   Medication Sig Start Date End Date Taking? Authorizing Provider  albuterol (PROVENTIL HFA;VENTOLIN HFA) 108 (90 Base) MCG/ACT inhaler Inhale 1-2 puffs into the lungs every 4 (four) hours as needed for wheezing or shortness of breath. 11/18/17   Ellyn HackShah, Syed Asad A, MD  ALPRAZolam Prudy Feeler(XANAX) 1 MG tablet Take 1 tablet (1 mg total) by mouth 4 (four) times daily as needed for anxiety. 11/03/17   Ellyn HackShah, Syed Asad A, MD  atorvastatin (LIPITOR) 40 MG tablet Take 1 tablet (40 mg total) by mouth daily at 6 PM. 11/03/17   Ellyn HackShah, Syed Asad A, MD  B COMPLEX VITAMINS PO Take by mouth.    [provider]  clopidogrel (PLAVIX) 75 MG tablet Take 1 tablet (75 mg total) by mouth daily. 11/03/17  Ellyn Hack, MD  Fluticasone-Umeclidin-Vilant (TRELEGY ELLIPTA) 100-62.5-25 MCG/INH AEPB Inhale 1 puff into the lungs daily. 07/29/17   Ellyn Hack, MD  meloxicam (MOBIC) 15 MG tablet Take 1 tablet (15 mg total) by mouth daily. 08/12/17   Helane Gunther, DPM  pantoprazole (PROTONIX) 40 MG tablet  Take 1 tablet (40 mg total) by mouth daily. 05/06/17   Ellyn Hack, MD  predniSONE (DELTASONE) 20 MG tablet Take 2 tablets (40 mg total) by mouth daily with breakfast. 10 days 11/25/17   Erin Fulling, MD  Saw Palmetto 160 MG CAPS Take by mouth.    [provider]    Allergies Amoxicillin; Aspirin; Codeine; Hydrocodone-acetaminophen; Oxycodone-acetaminophen; Oxycodone-acetaminophen; and Penicillins  Family History  Problem Relation Age of Onset  . Heart disease Mother   . Heart disease Father   . Diabetes Sister   . Hypertension Brother   . Cancer Paternal Grandmother        breast  . Diabetes Paternal Grandmother     Social History Social History   Tobacco Use  . Smoking status: Former Smoker    Packs/day: 2.00    Years: 23.00    Pack years: 46.00    Types: Cigarettes    Start date: 10/28/1989    Last attempt to quit: 10/28/2012    Years since quitting: 5.0  . Smokeless tobacco: Current User    Types: Snuff  Substance Use Topics  . Alcohol use: Yes    Alcohol/week: 0.0 oz    Comment: rarely  . Drug use: No    Review of Systems Constitutional: No fever/chills Eyes: No visual changes. ENT: No sore throat. Cardiovascular: Positive for chest pressure. Denies chest pain. Respiratory: Denies shortness of breath. Gastrointestinal: No abdominal pain.  No nausea, no vomiting.  No diarrhea.   Genitourinary: Negative for dysuria. Musculoskeletal: Negative for back pain. Skin: Negative for rash. Neurological: Negative for headaches, focal weakness or numbness.  ____________________________________________   PHYSICAL EXAM:  VITAL SIGNS: ED Triage Vitals  Enc Vitals Group     BP 11/25/17 0953 121/77     Pulse Rate 11/25/17 0950 76     Resp 11/25/17 0950 (!) 26     Temp 11/25/17 0950 97.9 F (36.6 C)     Temp Source 11/25/17 0950 Oral     SpO2 11/25/17 0950 96 %     Weight 11/25/17 0950 240 lb (108.9 kg)     Height 11/25/17 0950 6\' 2"  (1.88 m)      Head Circumference --      Peak Flow --      Pain Score 11/25/17 0949 5   Constitutional: Alert and oriented. Well appearing and in no distress. Eyes: Conjunctivae are normal.  ENT   Head: Normocephalic and atraumatic.   Nose: No congestion/rhinnorhea.   Mouth/Throat: Mucous membranes are moist.   Neck: No stridor. Hematological/Lymphatic/Immunilogical: No cervical lymphadenopathy. Cardiovascular: Normal rate, regular rhythm.  No murmurs, rubs, or gallops.  Respiratory: Normal respiratory effort without tachypnea nor retractions. Breath sounds are clear and equal bilaterally. No wheezes/rales/rhonchi. Occasional dry cough. Gastrointestinal: Soft and non tender. No rebound. No guarding.  Genitourinary: Deferred Musculoskeletal: Normal range of motion in all extremities. No lower extremity edema. Neurologic:  Normal speech and language. No gross focal neurologic deficits are appreciated.  Skin:  Skin is warm, dry and intact. No rash noted. Psychiatric: Mood and affect are normal. Speech and behavior are normal. Patient exhibits appropriate insight and judgment.  ____________________________________________  LABS (pertinent positives/negatives)  Trop <0.03 BMP wnl CBC wnl  ____________________________________________   EKG  I, Phineas SemenGraydon Samay Delcarlo, attending physician, personally viewed and interpreted this EKG  EKG Time: 0946 Rate: 86 Rhythm: normal sinus rhythm Axis: normal Intervals: qtc 440 QRS: narrow ST changes: no st elevation Impression: normal ekg   ____________________________________________    RADIOLOGY  CXR No acute disease  I, Carlisle Enke, personally viewed and evaluated these images (plain radiographs) as part of my medical decision making. ____________________________________________   PROCEDURES  Procedures  ____________________________________________   INITIAL IMPRESSION / ASSESSMENT AND PLAN / ED COURSE  Pertinent labs  & imaging results that were available during my care of the patient were reviewed by me and considered in my medical decision making (see chart for details).  Patient presented to the emergency department today because of concerns for chest pain/pressure in the setting of prolonged cough. ddx would be broad including cardiac etiology, msk inflammation, COPD exacerbation, bronchitis, PE amongst other etiologies. Work up in the emergency department without concerning findings. Tried mucomyst nebulizer without any significant relief. Will plan on discharging home with tessalon perles. Discussed findings and plan with patient.  ____________________________________________   FINAL CLINICAL IMPRESSION(S) / ED DIAGNOSES  Final diagnoses:  Shortness of breath  Cough     Note: This dictation was prepared with Dragon dictation. Any transcriptional errors that result from this process are unintentional     Phineas SemenGoodman, Shuntell Foody, MD 11/25/17 1433

## 2017-11-25 NOTE — Progress Notes (Signed)
Name: Cory Espinoza MRN: [id number] DOB: 06/03/1971     CONSULTATION DATE: 11/25/2017  REFERRING MD :  Annye AsaBoyce  CHIEF COMPLAINT:  cough  STUDIES:  CXR 11/18/17 No acute process No opacties   HISTORY OF PRESENT ILLNESS: 46 yo white male seen today for cough and wheezing Patient with Dx of COPD Seen recently by PCP for COPD exacerbation Had been treated with steroids/abx Patient on inhaled BD therapy  Patient with ongoing symptoms despite therapy  Patient also uses CPAP for underlying OSA And he would Like PCP to handle this at this time  Patient is a long-term smoker smoked 2-1/2 packs/day for 20 years Patient is a former smoker quit 6 and and half years ago Patient is a Emergency planning/management officerpolice officer  Patient also actively having chest pain and chest pressure midsternal patient has a history of coronary artery disease sees Dr. Arnoldo HookerBruce Kowalski as outpatient  Patient states that he also feels lightheadedness and dizziness Patient states he wants to feel better as soon as possible  At this time I feel that patient is a little unstable at this time and needs further workup patient will need to be transferred to the emergency room department for further workup      PAST MEDICAL HISTORY :   has a past medical history of Allergy, Anxiety, COPD (chronic obstructive pulmonary disease) (HCC), Coronary artery disease, Hyperlipidemia, and Hypertension.  has a past surgical history that includes Catheterization of pulmonary artery with retrieval of foreign body (Bilateral, 04/07/2011); Knee arthroscopy (Bilateral, 2004); Lumbar disc surgery (1999); Back surgery (1996); Cardiac catheterization (Left, 04/27/2016); and Cardiac catheterization (N/A, 04/27/2016). Prior to Admission medications   Medication Sig Start Date End Date Taking? Authorizing Provider  albuterol (PROVENTIL HFA;VENTOLIN HFA) 108 (90 Base) MCG/ACT inhaler Inhale 1-2 puffs into the lungs every 4 (four) hours as needed for wheezing or  shortness of breath. 11/18/17  Yes Ellyn HackShah, Syed Asad A, MD  ALPRAZolam Prudy Feeler(XANAX) 1 MG tablet Take 1 tablet (1 mg total) by mouth 4 (four) times daily as needed for anxiety. 11/03/17  Yes Ellyn HackShah, Syed Asad A, MD  atorvastatin (LIPITOR) 40 MG tablet Take 1 tablet (40 mg total) by mouth daily at 6 PM. 11/03/17  Yes Ellyn HackShah, Syed Asad A, MD  B COMPLEX VITAMINS PO Take by mouth.   Yes [provider]  clopidogrel (PLAVIX) 75 MG tablet Take 1 tablet (75 mg total) by mouth daily. 11/03/17  Yes Ellyn HackShah, Syed Asad A, MD  Fluticasone-Umeclidin-Vilant (TRELEGY ELLIPTA) 100-62.5-25 MCG/INH AEPB Inhale 1 puff into the lungs daily. 07/29/17  Yes Ellyn HackShah, Syed Asad A, MD  meloxicam (MOBIC) 15 MG tablet Take 1 tablet (15 mg total) by mouth daily. 08/12/17  Yes Helane GuntherMayer, Gregory, DPM  pantoprazole (PROTONIX) 40 MG tablet Take 1 tablet (40 mg total) by mouth daily. 05/06/17  Yes Ellyn HackShah, Syed Asad A, MD  Saw Palmetto 160 MG CAPS Take by mouth.   Yes [provider]   Allergies  Allergen Reactions  . Amoxicillin Other (See Comments)  . Aspirin Other (See Comments)    swelling of throat swelling of throat  . Codeine Hives  . Hydrocodone-Acetaminophen Other (See Comments)    itching itching  . Oxycodone-Acetaminophen Nausea And Vomiting  . Oxycodone-Acetaminophen Nausea And Vomiting  . Penicillins Other (See Comments)    unknown unknown    FAMILY HISTORY:  family history includes Cancer in his paternal grandmother; Diabetes in his paternal grandmother and sister; Heart disease in his father and mother; Hypertension in  his brother. SOCIAL HISTORY:  reports that he quit smoking about 5 years ago. His smoking use included cigarettes. He started smoking about 28 years ago. He has a 46.00 pack-year smoking history. His smokeless tobacco use includes snuff. He reports that he drinks alcohol. He reports that he does not use drugs.  REVIEW OF SYSTEMS:   Constitutional: +malaise/fatigue and +diaphoresis.+  lightheadedness HENT: Negative for hearing loss, ear pain, nosebleeds, congestion, sore throat, neck pain, tinnitus and ear discharge.   Eyes: Negative for blurred vision, double vision, photophobia, pain, discharge and redness.  Respiratory: +cough, + shortness of breath, +wheezing and -stridor.   Cardiovascular: +chest pain,  Gastrointestinal: Negative for heartburn, nausea, vomiting, abdominal pain, diarrhea, constipation, blood in stool and melena.  Genitourinary: Negative for dysuria, urgency, frequency, hematuria and flank pain.  Musculoskeletal: Negative for myalgias, back pain, joint pain and falls.  Skin: Negative for itching and rash.  Neurological: +dizziness, tingling, tremors, sensory change, speech change, focal weakness, seizures, loss of consciousness, weakness and headaches.  Endo/Heme/Allergies: Negative for environmental allergies and polydipsia. Does not bruise/bleed easily.  ALL OTHER ROS ARE NEGATIVE    VITAL SIGNS: BP 110/70 (BP Location: Left Arm, Cuff Size: Normal)   Pulse 91   Ht 6\' 2"  (1.88 m)   Wt 243 lb (110.2 kg)   SpO2 98%   BMI 31.20 kg/m    Physical Examination:   GENERAL:+resp distress HEAD: Normocephalic, atraumatic.  EYES: Pupils equal, round, reactive to light. Extraocular muscles intact. No scleral icterus.  MOUTH: Moist mucosal membrane.   EAR, NOSE, THROAT: Clear without exudates. No external lesions.  NECK: Supple. No thyromegaly. No nodules. No JVD.  PULMONARY:CTA B/L no wheezes, no crackles, no rhonchi CARDIOVASCULAR: S1 and S2. Regular rate and rhythm. No murmurs, rubs, or gallops. No edema.  GASTROINTESTINAL: Soft, nontender, nondistended. No masses. Positive bowel sounds.  MUSCULOSKELETAL: No swelling, clubbing, or edema. Range of motion full in all extremities.  NEUROLOGIC: Cranial nerves II through XII are intact. No gross focal neurological deficits.  SKIN: No ulceration, lesions, rashes, or cyanosis. Skin warm and dry. Turgor  intact.  PSYCHIATRIC: Mood, affect within normal limits. The patient is awake, alert and oriented x 3. Insight, judgment intact.       ASSESSMENT / PLAN: 46 year old white male seen today for acute wheezing acute shortness of breath and acute cough related to ongoing COPD exacerbation, patient being treated for acute COPD exacerbation with DuoNeb therapy at this time will also prescribe Depo-Medrol 120 mg IM.   Patient also having active chest pain and chest pressure patient will need to go to the emergency room for further care and management  Patient  satisfied with Plan of action and management. All questions answered  Lucie LeatherKurian David Makaia Rappa, M.D.  [facility]  Medical Director [facility] Medical Director [facility]

## 2017-11-25 NOTE — Addendum Note (Signed)
Addended by: Meyer CoryAHMAD, Benisha Hadaway R on: 11/25/2017 03:13 PM   Modules accepted: Orders

## 2017-12-02 ENCOUNTER — Telehealth: Payer: Self-pay | Admitting: Internal Medicine

## 2017-12-02 NOTE — Telephone Encounter (Signed)
Order for nebs from 11/25/17 not signed in Epic

## 2017-12-02 NOTE — Telephone Encounter (Signed)
Melissa Stenson and Sheron NightingaleJason Pierce [facility] have both been made aware MD is out of office until 12/08/16. Orders will be signed then. Nothing further needed.

## 2017-12-08 ENCOUNTER — Telehealth: Payer: Self-pay | Admitting: Internal Medicine

## 2017-12-08 NOTE — Telephone Encounter (Signed)
Pt states when he walks to his mailbox, he had to stop 4 times because he couldn't breathe. He has not gotten any better. Please call.

## 2017-12-08 NOTE — Telephone Encounter (Signed)
Needs to be seen

## 2017-12-08 NOTE — Telephone Encounter (Signed)
Pt given an appt with DS on 12/09/17. States he is sleeping sitting up and coughing frequently. Nothing further needed.

## 2017-12-09 ENCOUNTER — Encounter: Payer: Self-pay | Admitting: Pulmonary Disease

## 2017-12-09 ENCOUNTER — Ambulatory Visit (INDEPENDENT_AMBULATORY_CARE_PROVIDER_SITE_OTHER): Payer: BLUE CROSS/BLUE SHIELD | Admitting: Pulmonary Disease

## 2017-12-09 VITALS — BP 144/90 | HR 98 | Ht 74.0 in | Wt 246.0 lb

## 2017-12-09 DIAGNOSIS — R053 Chronic cough: Secondary | ICD-10-CM

## 2017-12-09 DIAGNOSIS — K219 Gastro-esophageal reflux disease without esophagitis: Secondary | ICD-10-CM | POA: Diagnosis not present

## 2017-12-09 DIAGNOSIS — Z87891 Personal history of nicotine dependence: Secondary | ICD-10-CM | POA: Diagnosis not present

## 2017-12-09 DIAGNOSIS — J310 Chronic rhinitis: Secondary | ICD-10-CM | POA: Diagnosis not present

## 2017-12-09 DIAGNOSIS — R05 Cough: Secondary | ICD-10-CM | POA: Diagnosis not present

## 2017-12-09 MED ORDER — PANTOPRAZOLE SODIUM 40 MG PO TBEC: 40.0000 mg | DELAYED_RELEASE_TABLET | Freq: Every day | ORAL | 10 refills | Status: DC

## 2017-12-09 MED ORDER — FLUTICASONE PROPIONATE 50 MCG/ACT NA SUSP: 2.0000 | Freq: Every day | NASAL | 10 refills | Status: DC

## 2017-12-09 NOTE — Patient Instructions (Addendum)
Continue Trelegy inhaler Begin Protonix 40 mg daily.  Take after dinner Begin Flonase inhaler, 2 sprays per nostril daily Lung function tests (PFTs) to be performed prior to follow-up visit with Dr. Belia HemanKasa Keep your appointment with Dr. Belia HemanKasa 12/23/17 Upon follow-up, further evaluation might include CT scan of the chest

## 2017-12-12 ENCOUNTER — Encounter: Payer: Self-pay | Admitting: Pulmonary Disease

## 2017-12-12 NOTE — Progress Notes (Signed)
ACUTE PULMONARY OFFICE VISIT  CHRONIC PULMONARY PROBLEMS: COPD  ACUTE PROBLEM: Increased dyspnea, cough and wheezing  SUBJ: Patient was last seen by DKA 11/25/17 and treated as a COPD exacerbation.  He has follow-up with DK 12/23/17.  His chief complaint today is persistent cough.  His cough is minimally productive.  He denies hemoptysis.  He describes his cough as a "smoker's cough.  Dr. Belia HemanKasa prescribed prednisone which he thinks was beneficial but now, his symptoms are relapsing.  OBJ: Vitals:   12/09/17 1137 12/09/17 1141  BP:  (!) 144/90  Pulse:  98  SpO2:  96%  Weight: 111.6 kg (246 lb)   Height: 6\' 2"  (1.88 m)      Gen: NAD HEENT: NCAT, sclerae white, bilateral rhinitis, oropharynx normal Neck: No LAN, no JVD noted Lungs: Diminished BS, few rhonchi, no wheezes Cardiovascular: RRR, no M noted Abdomen: Soft, NT, +BS Ext: no C/C/E  Neuro: PERRL, EOMI, motor/sensory grossly intact Skin: No lesions noted      IMPRESSION: Chronic cough - Plan: Pulmonary Function Test [facility] Only  Chronic rhinitis  Suspected GERD  Former smoker - Plan: Pulmonary Function Test [facility] Only   PLAN:   Continue Trelegy inhaler Begin Protonix 40 mg daily.  Take after dinner Begin Flonase inhaler, 2 sprays per nostril daily Lung function tests (PFTs) to be performed prior to follow-up visit with Dr. Belia HemanKasa 12/23/17 Upon follow-up, further evaluation might include CT scan of the chest   I have reviewed this patient's medical problems, current medications and therapies and prior pulmonary office notes in evaluation and formulation of the above assessment and plan   Billy Fischeravid Simonds, MD PCCM service Mobile [phone] Pager [phone] 12/12/2017 2:07 PM

## 2017-12-16 ENCOUNTER — Ambulatory Visit: Payer: BLUE CROSS/BLUE SHIELD | Attending: Pulmonary Disease

## 2017-12-16 DIAGNOSIS — R053 Chronic cough: Secondary | ICD-10-CM

## 2017-12-16 DIAGNOSIS — Z87891 Personal history of nicotine dependence: Secondary | ICD-10-CM | POA: Diagnosis not present

## 2017-12-16 DIAGNOSIS — R05 Cough: Secondary | ICD-10-CM

## 2017-12-16 MED ORDER — ALBUTEROL SULFATE (2.5 MG/3ML) 0.083% IN NEBU
2.5000 mg | INHALATION_SOLUTION | Freq: Once | RESPIRATORY_TRACT | Status: AC
  Administered 2017-12-16: 2.5 mg via RESPIRATORY_TRACT
  Filled 2017-12-16: qty 3

## 2017-12-21 ENCOUNTER — Other Ambulatory Visit
Admission: RE | Admit: 2017-12-21 | Discharge: 2017-12-21 | Disposition: A | Discharge status: Home / Self Care | Payer: BLUE CROSS/BLUE SHIELD | Source: Ambulatory Visit | Attending: Internal Medicine | Admitting: Internal Medicine

## 2017-12-21 ENCOUNTER — Telehealth: Payer: Self-pay | Admitting: Internal Medicine

## 2017-12-21 DIAGNOSIS — J45909 Unspecified asthma, uncomplicated: Secondary | ICD-10-CM | POA: Diagnosis not present

## 2017-12-21 DIAGNOSIS — G4733 Obstructive sleep apnea (adult) (pediatric): Secondary | ICD-10-CM | POA: Diagnosis not present

## 2017-12-21 LAB — CBC WITH DIFFERENTIAL/PLATELET
Basophils Absolute: 0.1 10*3/uL (ref 0–0.1)
Basophils Relative: 1 %
Eosinophils Absolute: 0 10*3/uL (ref 0–0.7)
Eosinophils Relative: 1 %
HCT: 49.5 % (ref 40.0–52.0)
Hemoglobin: 16.7 g/dL (ref 13.0–18.0)
Lymphocytes Relative: 28 %
Lymphs Abs: 1.9 10*3/uL (ref 1.0–3.6)
MCH: 32.2 pg (ref 26.0–34.0)
MCHC: 33.8 g/dL (ref 32.0–36.0)
MCV: 95.2 fL (ref 80.0–100.0)
Monocytes Absolute: 0.5 10*3/uL (ref 0.2–1.0)
Monocytes Relative: 7 %
Neutro Abs: 4.3 10*3/uL (ref 1.4–6.5)
Neutrophils Relative %: 63 %
Platelets: 216 10*3/uL (ref 150–440)
RBC: 5.2 MIL/uL (ref 4.40–5.90)
RDW: 13.4 % (ref 11.5–14.5)
WBC: 6.7 10*3/uL (ref 3.8–10.6)

## 2017-12-21 NOTE — Telephone Encounter (Signed)
Patient wants to know results for pft and is eager to get back to work if results are ok   Please call to discuss

## 2017-12-21 NOTE — Telephone Encounter (Signed)
Pt informed per DK,  Pt needs to keep his appt and we will go over PFT results and evaluate pt. Also DK wants pt to go and get labs. Pt informed. Orders placed. Nothing further needed.

## 2017-12-23 ENCOUNTER — Telehealth: Payer: Self-pay | Admitting: Internal Medicine

## 2017-12-23 ENCOUNTER — Encounter: Payer: Self-pay | Admitting: Internal Medicine

## 2017-12-23 ENCOUNTER — Other Ambulatory Visit: Payer: Self-pay | Admitting: Internal Medicine

## 2017-12-23 ENCOUNTER — Ambulatory Visit: Payer: BLUE CROSS/BLUE SHIELD | Admitting: Internal Medicine

## 2017-12-23 ENCOUNTER — Encounter: Payer: Self-pay | Admitting: *Deleted

## 2017-12-23 VITALS — BP 122/78 | HR 101

## 2017-12-23 DIAGNOSIS — R053 Chronic cough: Secondary | ICD-10-CM

## 2017-12-23 DIAGNOSIS — R05 Cough: Secondary | ICD-10-CM | POA: Diagnosis not present

## 2017-12-23 DIAGNOSIS — J4530 Mild persistent asthma, uncomplicated: Secondary | ICD-10-CM

## 2017-12-23 DIAGNOSIS — R071 Chest pain on breathing: Secondary | ICD-10-CM | POA: Diagnosis not present

## 2017-12-23 MED ORDER — BENZONATATE 100 MG PO CAPS: 200.0000 mg | ORAL_CAPSULE | Freq: Three times a day (TID) | ORAL | 1 refills | Status: DC | PRN

## 2017-12-23 MED ORDER — CETIRIZINE HCL 10 MG PO TABS: 10.0000 mg | ORAL_TABLET | Freq: Every day | ORAL | 6 refills | Status: DC

## 2017-12-23 MED ORDER — TIOTROPIUM BROMIDE MONOHYDRATE 1.25 MCG/ACT IN AERS: 2.0000 | INHALATION_SPRAY | Freq: Two times a day (BID) | RESPIRATORY_TRACT | 5 refills | Status: DC

## 2017-12-23 MED ORDER — FLUTICASONE-UMECLIDIN-VILANT 100-62.5-25 MCG/INH IN AEPB: 1.0000 | INHALATION_SPRAY | Freq: Every day | RESPIRATORY_TRACT | 5 refills | Status: DC

## 2017-12-23 NOTE — Patient Instructions (Signed)
Start ZYRTEC 10 mg daily STOP Trelegy Increase Tessolon Perles 200 mg as needed Albuterol as needed FLonase as prescribed  Obtain CT chest

## 2017-12-23 NOTE — Telephone Encounter (Signed)
Cory Espinoza has contacted patient.

## 2017-12-23 NOTE — Progress Notes (Signed)
   Name: Cory Espinoza MRN: [id number] DOB: 04/09/1971     CONSULTATION DATE: 12/23/2017  REFERRING MD :  Annye AsaBoyce  CHIEF COMPLAINT:  cough  STUDIES:  CXR 11/18/17 No acute process No opacties   12/2017 EOS levels WNL  PFT 12/2017 Small airways disease FEF 25/75 reduced to 66% predicted Fev1/FVC ratio WNL FVC reduced TLC reduced DLCO NL when corrected for volumes Small airways obstruction with restrictive lung disease from increased weight  HISTORY OF PRESENT ILLNESS: 47 yo white male seen today for follow up cough  Had been treated with steroids/abx several times Patient on inhaled BD therapy-trelegy not helping Albuterol and FLonase  Patient with ongoing symptoms despite therapy but improved since last visit  Patient also uses CPAP for underlying OSA  Patient is a long-term smoker smoked 2-1/2 packs/day for 20 years Patient is a former smoker quit 6 and and half years ago Patient is a Emergency planning/management officerpolice officer  No signs of infection at this time No CHF at this time  SOCIAL HISTORY:  reports that he quit smoking about 5 years ago. His smoking use included cigarettes. He started smoking about 28 years ago. He has a 46.00 pack-year smoking history. His smokeless tobacco use includes snuff. He reports that he drinks alcohol. He reports that he does not use drugs.  REVIEW OF SYSTEMS:   Constitutional: +malaise/fatigue and +diaphoresis.+ lightheadedness HENT: Negative for hearing loss, ear pain, nosebleeds, congestion, sore throat, neck pain, tinnitus and ear discharge.   Eyes: Negative for blurred vision, double vision, photophobia, pain, discharge and redness.  Respiratory: +cough, + shortness of breath Cardiovascular: +chest pain,  Endo/Heme/Allergies: Negative for environmental allergies and polydipsia. Does not bruise/bleed easily.  ALL OTHER ROS ARE NEGATIVE   BP 122/78 (BP Location: Left Arm, Cuff Size: Normal)   Pulse (!) 101   SpO2 97%    Physical Examination:    GENERALNAD HEAD: Normocephalic, atraumatic.  EYES: Pupils equal, round, reactive to light. Extraocular muscles intact. No scleral icterus.  MOUTH: Moist mucosal membrane.   EAR, NOSE, THROAT: Clear without exudates. No external lesions.  NECK: Supple. No thyromegaly. No nodules. No JVD.  PULMONARY:CTA B/L no wheezes, no crackles, no rhonchi CARDIOVASCULAR: S1 and S2. Regular rate and rhythm. No murmurs, rubs, or gallops. No edema.  PSYCHIATRIC: Mood, affect within normal limits. The patient is awake, alert and oriented x 3. Insight, judgment intact.    LAST OV plan Trelegy inhaler Protonix 40 mg daily.  Take after dinner Flonase inhaler, 2 sprays per nostril daily  ASSESSMENT / PLAN: 47 year old white male seen today for acute wheezing acute shortness of breath and acute cough related to ongoing small airways disease COPD exacerbation with allergic rhinits-[facility] to be [facility] and ALbuterol only at this time with ongoing persistent symtpoms.    Start ZYRTEC 10 mg daily STOP Trelegy Increase Tessolon Perles 200 mg as needed Albuterol as needed FLonase as prescribed Start Spririva Respimat Recommend weight loss No need for abx or systemic steroids at this time  Obtain CT chest  Patient  satisfied with Plan of action and management. All questions answered  Lucie LeatherKurian David Carrie Usery, M.D.  [facility]  Medical Director [facility] Medical Director [facility] Cardio[facility]Pulmonary Department

## 2017-12-23 NOTE — Telephone Encounter (Signed)
Patient returning call to set up appt for CT

## 2017-12-24 LAB — IGE: IgE (Immunoglobulin E), Serum: 31 IU/mL (ref 0–100)

## 2018-01-03 ENCOUNTER — Ambulatory Visit
Admission: RE | Admit: 2018-01-03 | Discharge: 2018-01-03 | Disposition: A | Discharge status: Home / Self Care | Payer: BLUE CROSS/BLUE SHIELD | Source: Ambulatory Visit | Attending: Internal Medicine | Admitting: Internal Medicine

## 2018-01-03 ENCOUNTER — Telehealth: Payer: Self-pay | Admitting: Internal Medicine

## 2018-01-03 DIAGNOSIS — R918 Other nonspecific abnormal finding of lung field: Secondary | ICD-10-CM | POA: Insufficient documentation

## 2018-01-03 DIAGNOSIS — R071 Chest pain on breathing: Secondary | ICD-10-CM | POA: Diagnosis not present

## 2018-01-03 DIAGNOSIS — J189 Pneumonia, unspecified organism: Secondary | ICD-10-CM

## 2018-01-03 DIAGNOSIS — R0602 Shortness of breath: Secondary | ICD-10-CM | POA: Diagnosis not present

## 2018-01-03 NOTE — Telephone Encounter (Signed)
Patient came by to discuss his back to work note he received His company will not accept the one he was given  He will need a note specifying he is 100% released back Please call to discuss

## 2018-01-04 NOTE — Telephone Encounter (Signed)
Pt states he is feeling better but is still having some SOB. States he has been walking more but can't do the treadmill as much as before. He states he is bringing paperwork up here to have DK to sign stating he can go back to work at 100%.   Pt is bringing paperwork. Please advise on CT chest.

## 2018-01-04 NOTE — Telephone Encounter (Signed)
He will need repeat CT chest in 8 weeks

## 2018-01-04 NOTE — Telephone Encounter (Signed)
Patient calling back to check on status of his back to work note Stated we are waiting to hear from Dr Belia HemanKasa [facility] call back with any additional information

## 2018-01-04 NOTE — Telephone Encounter (Signed)
Pt informed he will need to repeat CT chest w/o in 8 weeks due to PNA. Pt verbalized understanding. CT chest ordered. Nothing further needed.

## 2018-01-12 ENCOUNTER — Telehealth: Payer: Self-pay | Admitting: Internal Medicine

## 2018-01-12 NOTE — Telephone Encounter (Signed)
Pt calling stating he missed a call from us  Would like to know what it was about   Please call back

## 2018-01-12 NOTE — Telephone Encounter (Signed)
Called to confirm pt received what was needed for his job. He states HR received the paperwork. Nothing further needed.

## 2018-01-21 DIAGNOSIS — G4733 Obstructive sleep apnea (adult) (pediatric): Secondary | ICD-10-CM | POA: Diagnosis not present

## 2018-01-31 ENCOUNTER — Telehealth: Payer: Self-pay | Admitting: Internal Medicine

## 2018-01-31 NOTE — Telephone Encounter (Signed)
Pt calling stating he feels he is relapsing back into Pneumonia States he's been like this since Friday   Would like some advise  Please call back

## 2018-01-31 NOTE — Telephone Encounter (Signed)
Coughing, prod at times. Chest rattling. Cold chills but not fever. Pt states it all started on Friday. Acute visit scheduled with DS. Nothing further needed.

## 2018-02-01 ENCOUNTER — Encounter: Payer: Self-pay | Admitting: Pulmonary Disease

## 2018-02-01 ENCOUNTER — Ambulatory Visit (INDEPENDENT_AMBULATORY_CARE_PROVIDER_SITE_OTHER): Payer: BLUE CROSS/BLUE SHIELD | Admitting: Pulmonary Disease

## 2018-02-01 VITALS — BP 132/80 | HR 89 | Ht 74.0 in | Wt 244.0 lb

## 2018-02-01 DIAGNOSIS — J45909 Unspecified asthma, uncomplicated: Secondary | ICD-10-CM

## 2018-02-01 DIAGNOSIS — R05 Cough: Secondary | ICD-10-CM | POA: Diagnosis not present

## 2018-02-01 DIAGNOSIS — R059 Cough, unspecified: Secondary | ICD-10-CM

## 2018-02-01 MED ORDER — METHYLPREDNISOLONE ACETATE 80 MG/ML IJ SUSP
120.0000 mg | Freq: Once | INTRAMUSCULAR | Status: AC
  Administered 2018-02-01: 120 mg via INTRAMUSCULAR

## 2018-02-01 MED ORDER — DOXYCYCLINE HYCLATE 100 MG PO CAPS: 100.0000 mg | ORAL_CAPSULE | Freq: Two times a day (BID) | ORAL | 0 refills | Status: AC

## 2018-02-01 MED ORDER — METHYLPREDNISOLONE ACETATE 40 MG/ML INJ SUSP (RADIOLOG: 120.0000 mg | Freq: Once | INTRAMUSCULAR | Status: DC

## 2018-02-01 NOTE — Patient Instructions (Signed)
Depo-Medrol 120 mg administered in office today Doxycycline 100 mg twice a day for 7 days Continue Spiriva Respimat Continue albuterol inhaler as needed Appointment for CT scan and follow-up with Dr. Belia HemanKasa at the end of March Call us sooner if current symptoms do not improve significantly

## 2018-02-01 NOTE — Progress Notes (Signed)
ACUTE PULMONARY OFFICE VISIT  CHRONIC PULMONARY PROBLEMS: COPD  ACUTE PROBLEM: Increased dyspnea, cough and wheezing  SUBJ: Patient of DK. Last seen 01/04/18. Seen as acute visit today with more of the same -persistent rattling cough with thick brown mucus.  Denies CP, fever, purulent sputum, hemoptysis, LE edema and calf tenderness.  He states that the only thing that helps his cough is LawyerTessalon Perles.  He feels that the Trelegy inhaler actually made his cough worse.  He is presently maintained on Spiriva inhaler with albuterol as needed.  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   OBJ: Vitals:   02/01/18 0952 02/01/18 0955  BP:  132/80  Pulse:  89  SpO2:  93%  Weight: 110.7 kg (244 lb)   Height: 6\' 2"  (1.88 m)      Gen: NAD but with intermittent cough during encounter HEENT: NCAT, sclerae white, oropharynx normal Neck: No LAN, no JVD noted Lungs: Mildly diminished BS, end-expiratory wheezes Cardiovascular: RRR, no M noted Abdomen: Soft, NT, +BS Ext: no C/C/E  Neuro: PERRL, EOMI, motor/sensory grossly intact Skin: No lesions noted  BMP Latest Ref Rng & Units 11/25/2017 08/10/2017 07/06/2017  Glucose 65 - 99 mg/dL 97 119(J103(H) 89  BUN 6 - 20 mg/dL 15 10 7   Creatinine 0.61 - 1.24 mg/dL 4.780.92 2.950.85 6.210.83  BUN/Creat Ratio 9 - 20 - 12 8(L)  Sodium 135 - 145 mmol/L 137 139 139  Potassium 3.5 - 5.1 mmol/L 3.7 4.7 4.6  Chloride 101 - 111 mmol/L 104 104 103  CO2 22 - 32 mmol/L 27 19(L) 24  Calcium 8.9 - 10.3 mg/dL 9.3 9.5 9.6    CBC Latest Ref Rng & Units 12/21/2017 11/25/2017 08/10/2017  WBC 3.8 - 10.6 K/uL 6.7 7.3 5.7  Hemoglobin 13.0 - 18.0 g/dL 30.816.7 65.717.4 84.616.6  Hematocrit 40.0 - 52.0 % 49.5 50.3 48.4  Platelets 150 - 440 K/uL 216 236 219     No recent chest x-ray  IMPRESSION: Recurrent acute asthmatic bronchitis with severe productive cough and purulent mucus.  It is unclear why his condition is so  refractory to therapy.  Dr. Belia HemanKasa has checked IgE level which was normal and he has had a CBC in the recent past without elevated eosinophils.   PLAN:   Depo-Medrol 120 mg administered in office today Doxycycline 100 mg twice a day for 7 days Continue Spiriva Respimat Continue albuterol inhaler as needed Appointment for CT scan and follow-up with Dr. Belia HemanKasa at the end of March He is to call us sooner if current symptoms do not improve significantly    I have reviewed this patient's medical problems, current medications and therapies and prior pulmonary office notes in evaluation and formulation of the above assessment and plan   Billy Fischeravid Tabb Croghan, MD PCCM service Mobile [phone] Pager [phone] 02/01/2018 4:45 PM

## 2018-02-04 ENCOUNTER — Ambulatory Visit: Payer: BLUE CROSS/BLUE SHIELD | Admitting: Family Medicine

## 2018-02-04 ENCOUNTER — Telehealth: Payer: Self-pay | Admitting: Family Medicine

## 2018-02-04 ENCOUNTER — Encounter: Payer: Self-pay | Admitting: Family Medicine

## 2018-02-04 DIAGNOSIS — I10 Essential (primary) hypertension: Secondary | ICD-10-CM | POA: Diagnosis not present

## 2018-02-04 DIAGNOSIS — I2510 Atherosclerotic heart disease of native coronary artery without angina pectoris: Secondary | ICD-10-CM | POA: Diagnosis not present

## 2018-02-04 DIAGNOSIS — F419 Anxiety disorder, unspecified: Secondary | ICD-10-CM

## 2018-02-04 DIAGNOSIS — E7800 Pure hypercholesterolemia, unspecified: Secondary | ICD-10-CM | POA: Diagnosis not present

## 2018-02-04 DIAGNOSIS — J449 Chronic obstructive pulmonary disease, unspecified: Secondary | ICD-10-CM

## 2018-02-04 MED ORDER — CLOPIDOGREL BISULFATE 75 MG PO TABS: 75.0000 mg | ORAL_TABLET | Freq: Every day | ORAL | 1 refills | Status: DC

## 2018-02-04 MED ORDER — ATORVASTATIN CALCIUM 40 MG PO TABS: 40.0000 mg | ORAL_TABLET | Freq: Every day | ORAL | 1 refills | Status: DC

## 2018-02-04 MED ORDER — ATORVASTATIN CALCIUM 40 MG PO TABS: 40.0000 mg | ORAL_TABLET | Freq: Every day | ORAL | 1 refills | Status: AC

## 2018-02-04 MED ORDER — ESCITALOPRAM OXALATE 10 MG PO TABS: 10.0000 mg | ORAL_TABLET | Freq: Every day | ORAL | 0 refills | Status: DC

## 2018-02-04 NOTE — Telephone Encounter (Signed)
I spoke with [facility] staff; she spoke with patient in detail today about options, including patient going to [facility] for detox; risk of seizure / withdrawal; reasons to seek medical attention; dangers involved; she gave him information; I thanked her for working with him

## 2018-02-04 NOTE — Patient Instructions (Addendum)
I recommend that you go NOW to [facility] to have them help you taper off of your benzo and deal with your anxiety If you stop the alprazolam abruptly, you can have a seizure which can be fatal It is important for you to seek help to come off of this medicine safely; go to the emergency department if needed [facility] me if I can be of assistance as you taper off  Here are some resources to help you  National Suicide Prevention Lifeline - Call (575)008-54151-(218)360-6501  for help - Website with more resources: ARanked.fihttps://suicidepreventionlifeline.org/  [facility] Mobile Crisis Program - Call [phone] [phone] for help. - Mobile Crisis Program available 24 hours a day, 365 days a year. - Available for anyone of any age in [place].  [facility] - Address: [address], [place] - Telephone: [phone]  - Hours of Operation: Sunday - Saturday - 8:00 a.m. - 8:00 p.m. - Medicaid, Medicare (Government Issued Only), BCBS, and [employer] - Pay - Crisis Management, Outpatient Individual & Group Therapy, Psychiatrists on-site to provide medication management, In-Home Psychiatric Care, and Peer Support Care.  [facility] - Call [phone] for help. - Mobile Crisis Program available 24 hours a day, 365 days a year. - Available for anyone of any age in [place]  Try to limit saturated fats in your diet (bologna, hot dogs, barbeque, cheeseburgers, hamburgers, steak, bacon, sausage, cheese, etc.) and get more fresh fruits, vegetables, and whole grains Return in 8 weeks for fasting labs, visit with me 2-3 days later  Call me with an update on the anxiety in 3 weeks, or schedule an appointment if you think that would be helpful

## 2018-02-04 NOTE — Assessment & Plan Note (Signed)
Continue medicine; check lipids today

## 2018-02-04 NOTE — Telephone Encounter (Signed)
Copied from CRM [phone]. Topic: Quick Communication - See Telephone Encounter >> Feb 04, 2018  1:26 PM Maia Espinoza, Cory S wrote: CRM for notification. See Telephone encounter for: 02/04/18. - received call from [place] stating she needed to speak directly to Dr. Sherie Espinoza for referral to taper pt off benzos. Pt has not had xanax since yesterday and she is needing to address concerns regarding withdrawals with the provider. I contacted the office and requested Dr. Sherie Espinoza regarding the referral as Cory Espinoza stated a sense of urgency. I was advised the office does not take calls between 12:30-1:30pm. I advised office Cory Espinoza stated sense of urgency regarding the referral. I was placed on hold them advised no referral had been sent today. I advised Cory Espinoza of this and she stated she had the paperwork from Cory Espinoza in her hand. Cory Espinoza requesting urgent call back from Dr. Sherie Espinoza.

## 2018-02-04 NOTE — Assessment & Plan Note (Signed)
Well-controlled; DASH guidelines 

## 2018-02-04 NOTE — Telephone Encounter (Signed)
First number - reached voicemail I called [facility] directly - left message for her to call me

## 2018-02-04 NOTE — Progress Notes (Signed)
BP 126/68 (BP Location: Right Arm, Patient Position: Sitting, Cuff Size: Large)   Pulse 89   Temp (!) 97.4 F (36.3 C) (Oral)   Wt 243 lb 11.2 oz (110.5 kg)   SpO2 97%   BMI 31.29 kg/m    Subjective:    Patient ID: Cory Espinoza, male    DOB: 11/02/1971, 47 y.o.   MRN: [id number]  HPI: Cory Espinoza is a 47 y.o. male  Chief Complaint  Patient presents with  . Follow-up    HPI Patient is here as a new patient to me; previous provider left the office  High cholesterol; motivated with diet and weight loss  He has had coronary artery disease, sees cardiologist Dr. Gwen PoundsKowalski, but last visit June 2017 (reviewed portion, pasted below); needs to take plavix the rest of his life heart doctor said; allergic to aspirin  "previous cardiovascular disease and coronary atherosclerosis with a recent cardiac catheterization to assess all of these symptoms. Patient does have a bifurcating stenosis of about 60-70% at the obtuse marginal and mid left circumflex which is unchanged from previous cardiac catheterization and had a FFR for which the patient had no significant stenosis requiring further intervention today."  Chronic cough; saw pulmonologist; had CT scan; some shortness of breath and called the pulmonologist; patient saw Dr. Sung AmabileSimonds (usually sees Dr. Arturo MortonKada); had done 3 treatments of prednisone and gained weight; has been taking doxy; not getting as winded; was out of work for a while; going to have another CT scan at the end of March; works as a Emergency planning/management officerpolice officer; breathing is better but not great; cough is about the same; using SABA 8-9 x yesterday; stopped the Trelegy  Sleep apnea; using the CPAP which helps  Anxiety and chronic benzo; will refer to [facility]; has been on the alprazolam; Dr. Sherryll BurgerShah had talked with him about taking him off of this and using something else; patient says he does not need to see a psychiatrist; he is aware that I won't prescribe that; he has missed his medicine for a  few days and gets sweats and nauseated and trouble sleeping  Obesity; trying to walk more; wife helping him eat better; has trouble getting below 230 pounds; used to weight 199 pounds, then has gained weight  Depression screen [facility] 2/9 02/04/2018 11/22/2017 11/08/2017 11/03/2017 08/10/2017  Decreased Interest 0 0 0 0 0  Down, Depressed, Hopeless 0 0 0 0 0  PHQ - 2 Score 0 0 0 0 0    Relevant past medical, surgical, family and social history reviewed Past Medical History:  Diagnosis Date  . Allergy   . Anxiety   . COPD (chronic obstructive pulmonary disease) (HCC)   . Coronary artery disease   . Hyperlipidemia   . Hypertension    Past Surgical History:  Procedure Laterality Date  . BACK SURGERY  1996  . CARDIAC CATHETERIZATION Left 04/27/2016   Procedure: Left Heart Cath and Coronary Angiography;  Surgeon: Lamar BlinksBruce J Kowalski, MD;  Location: [facility] INVASIVE CV LAB;  Service: Cardiovascular;  Laterality: Left;  . CARDIAC CATHETERIZATION N/A 04/27/2016   Procedure: Intravascular Pressure Wire/FFR Study;  Surgeon: Alwyn Peawayne D Callwood, MD;  Location: [facility] INVASIVE CV LAB;  Service: Cardiovascular;  Laterality: N/A;  . CATHETERIZATION OF PULMONARY ARTERY WITH RETRIEVAL OF FOREIGN BODY Bilateral 04/07/2011   heart  . KNEE ARTHROSCOPY Bilateral 2004  . LUMBAR DISC SURGERY  1999   Family History  Problem Relation Age of Onset  . Heart disease Mother   .  Heart disease Father   . Diabetes Sister   . Hypertension Brother   . Cancer Paternal Grandmother        breast  . Diabetes Paternal Grandmother    Social History   Tobacco Use  . Smoking status: Former Smoker    Packs/day: 2.00    Years: 23.00    Pack years: 46.00    Types: Cigarettes    Start date: 10/28/1989    Last attempt to quit: 10/28/2012    Years since quitting: 5.2  . Smokeless tobacco: Current User    Types: Snuff  Substance Use Topics  . Alcohol use: Yes    Alcohol/week: 0.0 oz    Comment: rarely  . Drug use: No     Interim medical history since last visit reviewed. Allergies and medications reviewed  Review of Systems Per HPI unless specifically indicated above     Objective:    BP 126/68 (BP Location: Right Arm, Patient Position: Sitting, Cuff Size: Large)   Pulse 89   Temp (!) 97.4 F (36.3 C) (Oral)   Wt 243 lb 11.2 oz (110.5 kg)   SpO2 97%   BMI 31.29 kg/m     Physical Exam  Constitutional: He appears well-developed and well-nourished. No distress.  HENT:  Head: Normocephalic and atraumatic.  Eyes: EOM are normal. No scleral icterus.  Neck: No thyromegaly present.  Cardiovascular: Normal rate and regular rhythm.  Pulmonary/Chest: Effort normal. He has decreased breath sounds. He has no wheezes. He has no rhonchi.  Occasional cough with deep breathing  Abdominal: Soft. Bowel sounds are normal. He exhibits no distension.  Musculoskeletal: He exhibits no edema.  Neurological: Coordination normal.  Skin: Skin is warm and dry. No pallor.  Psychiatric: He has a normal mood and affect. His behavior is normal. Judgment and thought content normal. His mood appears not anxious. He does not exhibit a depressed mood.      Assessment & Plan:   Problem List Items Addressed This Visit      Cardiovascular and Mediastinum   Atherosclerotic heart disease of native coronary artery without angina pectoris   Relevant Medications   clopidogrel (PLAVIX) 75 MG tablet   atorvastatin (LIPITOR) 40 MG tablet   BP (high blood pressure) (Chronic)    Well-controlled; DASH guidelines      Relevant Medications   atorvastatin (LIPITOR) 40 MG tablet     Respiratory   Chronic obstructive pulmonary disease (HCC)    Managed by pulmonologist        Other   Hyperlipidemia (Chronic)    Continue medicine; check lipids today      Relevant Medications   atorvastatin (LIPITOR) 40 MG tablet   Anxiety disorder, unspecified    Patient received letter from this clinic from previous provider about not  receiving any additional benzos from this office after previous provider left; he will need to seek care with [facility] or rehab or psychiatrist; advised that withdrawal from benzo use can result in seizures and even death and he was urged to seek care for withdrawal/weaning off medicine      Relevant Medications   escitalopram (LEXAPRO) 10 MG tablet       Follow up plan: Return in about 8 weeks (around 04/01/2018) for fasting labs only, then appt with Dr. Sherie Don 2-3 days later.  An after-visit summary was printed and given to the patient at check-out.  Please see the patient instructions which may contain other information and recommendations beyond what is mentioned above in  the assessment and plan.  Meds ordered this encounter  Medications  . clopidogrel (PLAVIX) 75 MG tablet    Sig: Take 1 tablet (75 mg total) by mouth daily.    Dispense:  90 tablet    Refill:  1  . DISCONTD: atorvastatin (LIPITOR) 40 MG tablet    Sig: Take 1 tablet (40 mg total) by mouth daily at 6 PM.    Dispense:  90 tablet    Refill:  1  . atorvastatin (LIPITOR) 40 MG tablet    Sig: Take 1 tablet (40 mg total) by mouth at bedtime.    Dispense:  90 tablet    Refill:  1    Use this one please  . escitalopram (LEXAPRO) 10 MG tablet    Sig: Take 1 tablet (10 mg total) by mouth daily.    Dispense:  30 tablet    Refill:  0    No orders of the defined types were placed in this encounter.

## 2018-02-04 NOTE — Assessment & Plan Note (Signed)
Managed by pulmonologist 

## 2018-02-08 ENCOUNTER — Emergency Department
Admission: EM | Admit: 2018-02-08 | Discharge: 2018-02-08 | Disposition: A | Discharge status: Home / Self Care | Payer: BLUE CROSS/BLUE SHIELD | Attending: Emergency Medicine | Admitting: Emergency Medicine

## 2018-02-08 ENCOUNTER — Other Ambulatory Visit: Payer: Self-pay

## 2018-02-08 DIAGNOSIS — J449 Chronic obstructive pulmonary disease, unspecified: Secondary | ICD-10-CM | POA: Diagnosis not present

## 2018-02-08 DIAGNOSIS — J45909 Unspecified asthma, uncomplicated: Secondary | ICD-10-CM | POA: Diagnosis not present

## 2018-02-08 DIAGNOSIS — I2510 Atherosclerotic heart disease of native coronary artery without angina pectoris: Secondary | ICD-10-CM | POA: Insufficient documentation

## 2018-02-08 DIAGNOSIS — F13230 Sedative, hypnotic or anxiolytic dependence with withdrawal, uncomplicated: Secondary | ICD-10-CM

## 2018-02-08 DIAGNOSIS — Z87891 Personal history of nicotine dependence: Secondary | ICD-10-CM | POA: Diagnosis not present

## 2018-02-08 DIAGNOSIS — I10 Essential (primary) hypertension: Secondary | ICD-10-CM | POA: Diagnosis not present

## 2018-02-08 DIAGNOSIS — F19230 Other psychoactive substance dependence with withdrawal, uncomplicated: Secondary | ICD-10-CM | POA: Insufficient documentation

## 2018-02-08 DIAGNOSIS — F13930 Sedative, hypnotic or anxiolytic use, unspecified with withdrawal, uncomplicated: Secondary | ICD-10-CM

## 2018-02-08 DIAGNOSIS — F419 Anxiety disorder, unspecified: Secondary | ICD-10-CM | POA: Insufficient documentation

## 2018-02-08 DIAGNOSIS — R112 Nausea with vomiting, unspecified: Secondary | ICD-10-CM | POA: Diagnosis present

## 2018-02-08 LAB — CBC
HCT: 52.9 % — ABNORMAL HIGH (ref 40.0–52.0)
Hemoglobin: 18.1 g/dL — ABNORMAL HIGH (ref 13.0–18.0)
MCH: 32.5 pg (ref 26.0–34.0)
MCHC: 34.2 g/dL (ref 32.0–36.0)
MCV: 95 fL (ref 80.0–100.0)
Platelets: 256 10*3/uL (ref 150–440)
RBC: 5.57 MIL/uL (ref 4.40–5.90)
RDW: 13.5 % (ref 11.5–14.5)
WBC: 8.9 10*3/uL (ref 3.8–10.6)

## 2018-02-08 LAB — BASIC METABOLIC PANEL
Anion gap: 11 (ref 5–15)
BUN: 19 mg/dL (ref 6–20)
CO2: 22 mmol/L (ref 22–32)
Calcium: 9.5 mg/dL (ref 8.9–10.3)
Chloride: 106 mmol/L (ref 101–111)
Creatinine, Ser: 0.86 mg/dL (ref 0.61–1.24)
GFR calc Af Amer: >60 mL/min (ref >60)
GFR calc non Af Amer: >60 mL/min (ref >60)
Glucose, Bld: 107 mg/dL — ABNORMAL HIGH (ref 65–99)
Potassium: 4 mmol/L (ref 3.5–5.1)
Sodium: 139 mmol/L (ref 135–145)

## 2018-02-08 MED ORDER — ONDANSETRON 4 MG PO TBDP
4.0000 mg | ORAL_TABLET | Freq: Once | ORAL | Status: AC
Start: 2018-02-08 — End: 2018-02-08
  Administered 2018-02-08: 4 mg via ORAL
  Filled 2018-02-08: qty 1

## 2018-02-08 MED ORDER — ALPRAZOLAM 0.5 MG PO TABS
ORAL_TABLET | ORAL | Status: AC
  Filled 2018-02-08: qty 4

## 2018-02-08 MED ORDER — ALPRAZOLAM 0.5 MG PO TABS
1.0000 mg | ORAL_TABLET | Freq: Once | ORAL | Status: AC
  Administered 2018-02-08: 1 mg via ORAL
  Filled 2018-02-08: qty 2

## 2018-02-08 MED ORDER — ONDANSETRON 4 MG PO TBDP: 4.0000 mg | ORAL_TABLET | Freq: Three times a day (TID) | ORAL | 0 refills | Status: DC | PRN

## 2018-02-08 MED ORDER — ALPRAZOLAM 1 MG PO TABS: 1.0000 mg | ORAL_TABLET | Freq: Three times a day (TID) | ORAL | 0 refills | Status: AC | PRN

## 2018-02-08 NOTE — ED Provider Notes (Addendum)
[facility] Emergency Department Provider Note       Time seen: ----------------------------------------- 1:26 PM on 02/08/2018 -----------------------------------------   I have reviewed the triage vital signs and the nursing notes.  HISTORY   Chief Complaint Withdrawal    HPI Cory Espinoza is a 47 y.o. male with a history of allergies, anxiety, COPD, coronary artery disease, hyperlipidemia and hypertension who presents to the ED for possible withdrawal.  Patient went to see a new doctor who would not prescribe Xanax he was taking which was 4 mg/day.  He states his last dose was on Friday and he is having nausea, vomiting, shakes and sweats.  Patient states he went back to his primary care doctor today and they referred him to the ER for evaluation of withdrawal.  Patient reportedly has been taking Xanax for the past 12 years.  Past Medical History:  Diagnosis Date  . Allergy   . Anxiety   . COPD (chronic obstructive pulmonary disease) (HCC)   . Coronary artery disease   . Hyperlipidemia   . Hypertension     Patient Active Problem List   Diagnosis Date Noted  . Cough productive of purulent sputum 11/08/2017  . GERD (gastroesophageal reflux disease) 05/06/2017  . Contusion of hand 04/22/2017  . BP (high blood pressure) 04/22/2017  . Oxygen desaturation 04/22/2017  . Prostate lump 04/22/2017  . Unstable angina (HCC) 04/27/2016  . Chest pain, unspecified 04/24/2016  . Obesity 01/29/2016  . Elevated ALT measurement 10/30/2015  . Nonspecific elevation of levels of transaminase and lactic acid dehydrogenase (ldh) 10/30/2015  . Reduced libido 10/29/2015  . Hyperlipidemia 08/06/2015  . Anxiety 08/06/2015  . Atherosclerosis of coronary artery 08/06/2015  . Childhood asthma 08/06/2015  . Chronic obstructive pulmonary disease (HCC) 08/06/2015  . OSA (obstructive sleep apnea) 08/06/2015  . Anxiety disorder, unspecified 08/06/2015  . Atherosclerotic  heart disease of native coronary artery without angina pectoris 08/06/2015  . Uncomplicated asthma 08/06/2015    Past Surgical History:  Procedure Laterality Date  . BACK SURGERY  1996  . CARDIAC CATHETERIZATION Left 04/27/2016   Procedure: Left Heart Cath and Coronary Angiography;  Surgeon: Lamar BlinksBruce J Kowalski, MD;  Location: [facility] INVASIVE CV LAB;  Service: Cardiovascular;  Laterality: Left;  . CARDIAC CATHETERIZATION N/A 04/27/2016   Procedure: Intravascular Pressure Wire/FFR Study;  Surgeon: Alwyn Peawayne D Callwood, MD;  Location: [facility] INVASIVE CV LAB;  Service: Cardiovascular;  Laterality: N/A;  . CATHETERIZATION OF PULMONARY ARTERY WITH RETRIEVAL OF FOREIGN BODY Bilateral 04/07/2011   heart  . KNEE ARTHROSCOPY Bilateral 2004  . LUMBAR DISC SURGERY  1999    Allergies Amoxicillin; Aspirin; Codeine; Hydrocodone-acetaminophen; Oxycodone-acetaminophen; Oxycodone-acetaminophen; and Penicillins  Social History Social History   Tobacco Use  . Smoking status: Former Smoker    Packs/day: 2.00    Years: 23.00    Pack years: 46.00    Types: Cigarettes    Start date: 10/28/1989    Last attempt to quit: 10/28/2012    Years since quitting: 5.2  . Smokeless tobacco: Current User    Types: Snuff  Substance Use Topics  . Alcohol use: Yes    Alcohol/week: 0.0 oz    Comment: rarely  . Drug use: No    Review of Systems Constitutional: Negative for fever. Cardiovascular: Negative for chest pain. Respiratory: Negative for shortness of breath. Gastrointestinal: Negative for abdominal pain, positive for nausea, vomiting Musculoskeletal: Negative for back pain. Skin: Negative for rash.  Positive for sweats Neurological: Negative for headaches, focal weakness  or numbness.  All systems negative/normal/unremarkable except as stated in the HPI  ____________________________________________   PHYSICAL EXAM:  VITAL SIGNS: ED Triage Vitals  Enc Vitals Group     BP 02/08/18 1258 135/84     Pulse  Rate 02/08/18 1258 83     Resp 02/08/18 1258 18     Temp 02/08/18 1258 99.2 F (37.3 C)     Temp Source 02/08/18 1258 Oral     SpO2 02/08/18 1258 96 %     Weight 02/08/18 1310 240 lb (108.9 kg)     Height 02/08/18 1310 6\' 2"  (1.88 m)     Head Circumference --      Peak Flow --      Pain Score 02/08/18 1310 0     Pain Loc --      Pain Edu? --      Excl. in GC? --    Constitutional: Alert and oriented. Well appearing and in no distress. Eyes: Conjunctivae are normal. Normal extraocular movements. ENT   Head: Normocephalic and atraumatic.   Nose: No congestion/rhinnorhea.   Mouth/Throat: Mucous membranes are moist.   Neck: No stridor. Cardiovascular: Normal rate, regular rhythm. No murmurs, rubs, or gallops. Respiratory: Normal respiratory effort without tachypnea nor retractions. Breath sounds are clear and equal bilaterally. No wheezes/rales/rhonchi. Gastrointestinal: Soft and nontender. Normal bowel sounds Musculoskeletal: Nontender with normal range of motion in extremities. No lower extremity tenderness nor edema. Neurologic:  Normal speech and language. No gross focal neurologic deficits are appreciated.  Skin:  Skin is warm, dry and intact. No rash noted. Psychiatric: Mood and affect are normal. Speech and behavior are normal.  ____________________________________________  ED COURSE:  As part of my medical decision making, I reviewed the following data within the electronic MEDICAL RECORD NUMBER History obtained from family if available, nursing notes, old chart and ekg, as well as notes from prior ED visits. Patient presented for possible withdrawal from Xanax, we will assess with labs and imaging as indicated at this time.   Procedures ____________________________________________   LABS (pertinent positives/negatives)  Labs Reviewed  CBC  BASIC METABOLIC PANEL  URINE DRUG SCREEN, QUALITATIVE ([facility] ONLY)   ____________________________________________  DIFFERENTIAL DIAGNOSIS   Benzodiazepine withdrawal, overdose, dehydration, electrolyte abnormality  FINAL ASSESSMENT AND PLAN  Benzodiazepine withdrawal   Plan: The patient had presented for benzodiazepine withdrawal. Patient's labs are unremarkable.  Due to patient's extensive history of daily use I am uncomfortable with him withdrawing without significant side effect or seizure.  I will supply a short course of Xanax to prevent adverse events.  I did verify the information he gave me as correct through the controlled substance registry   Ulice Dash, MD   Note: This note was generated in part or whole with voice recognition software. Voice recognition is usually quite accurate but there are transcription errors that can and very often do occur. I apologize for any typographical errors that were not detected and corrected.     Emily Filbert, MD 02/08/18 1330    Emily Filbert, MD 02/08/18 [phone]

## 2018-02-08 NOTE — ED Triage Notes (Signed)
Pt states his PCP moved and went to see the new doctor who would not prescribe the xanax he was taking 4mg /day. States his last dose was on Friday and has been having N/V, shakes, sweats. States he went back to his PCP today and they referred him to the ED for eval of withdrawals.

## 2018-02-08 NOTE — ED Triage Notes (Signed)
First Nurse Note:  Arrives with c/o anxiety.  Patient has been taking Xanax for the past 12 years and PCP "stopped medication cold [place]" on Friday.

## 2018-02-11 ENCOUNTER — Telehealth: Payer: Self-pay | Admitting: Family Medicine

## 2018-02-11 NOTE — Telephone Encounter (Signed)
Per the request of Dr. Baruch GoutyMelinda Lada, I tried to contact this patient to inform him that Dr. Baruch GoutyMelinda Lada is not managing his Alprazolam and he is to follow up at the nearest rehab facility, [facility] or the ED but there was no answer. I left him a message stating that I will try to reach him at home. I then contacted the number listed in his file as a home number and it stated that it was not in service so I tried his cell phone number again.  I restated stated who I was, where I was calling from and why I was calling. I also mentioned several times that Dr. Sherie DonLada is not and will not be managing his care regarding Alprazolam. He was told to give us a call back if he had any questions but was strongly encouraged to seek help at the nearest rehab, [facility] or ED for advice regarding tapering off this medication.  I was not able to tell him that he should not stop this medication due to it causing seizures but I will put in a CRM in case he calls back.

## 2018-02-11 NOTE — Telephone Encounter (Signed)
He needs to either go to rehab ([facility]), or he can return to the ER

## 2018-02-11 NOTE — Telephone Encounter (Signed)
Please see below and advise for documentation.

## 2018-02-11 NOTE — Telephone Encounter (Signed)
Copied from CRM [id number]. Topic: Quick Communication - See Telephone Encounter >> Feb 11, 2018  9:53 AM Herby AbrahamJohnson, Shiquita C wrote: CRM for notification. See Telephone encounter for: pt call in requesting a call back. Pt says that provider had him taking ALPRAZolam (XANAX) 1 MG tablet. Pt says that he is having a time and would like to know if provider is winging him off of medication OR if provider would like for him to stop medication all together?   CB: [phone]: [phone]  02/11/18.

## 2018-02-11 NOTE — Assessment & Plan Note (Signed)
Patient received letter from this clinic from previous provider about not receiving any additional benzos from this office after previous provider left; he will need to seek care with [facility] or rehab or psychiatrist; advised that withdrawal from benzo use can result in seizures and even death and he was urged to seek care for withdrawal/weaning off medicine

## 2018-02-14 DIAGNOSIS — I2510 Atherosclerotic heart disease of native coronary artery without angina pectoris: Secondary | ICD-10-CM | POA: Diagnosis not present

## 2018-02-14 DIAGNOSIS — E7801 Familial hypercholesterolemia: Secondary | ICD-10-CM | POA: Diagnosis not present

## 2018-02-14 DIAGNOSIS — I10 Essential (primary) hypertension: Secondary | ICD-10-CM | POA: Diagnosis not present

## 2018-02-14 DIAGNOSIS — J449 Chronic obstructive pulmonary disease, unspecified: Secondary | ICD-10-CM | POA: Diagnosis not present

## 2018-02-15 ENCOUNTER — Ambulatory Visit: Payer: Self-pay

## 2018-02-18 DIAGNOSIS — G4733 Obstructive sleep apnea (adult) (pediatric): Secondary | ICD-10-CM | POA: Diagnosis not present

## 2018-03-04 ENCOUNTER — Ambulatory Visit
Admission: RE | Admit: 2018-03-04 | Discharge: 2018-03-04 | Disposition: A | Discharge status: Home / Self Care | Payer: BLUE CROSS/BLUE SHIELD | Source: Ambulatory Visit | Attending: Internal Medicine | Admitting: Internal Medicine

## 2018-03-04 DIAGNOSIS — J9811 Atelectasis: Secondary | ICD-10-CM | POA: Insufficient documentation

## 2018-03-04 DIAGNOSIS — J181 Lobar pneumonia, unspecified organism: Secondary | ICD-10-CM | POA: Diagnosis not present

## 2018-03-04 DIAGNOSIS — J189 Pneumonia, unspecified organism: Secondary | ICD-10-CM | POA: Diagnosis not present

## 2018-03-08 DIAGNOSIS — G4733 Obstructive sleep apnea (adult) (pediatric): Secondary | ICD-10-CM | POA: Diagnosis not present

## 2018-03-09 DIAGNOSIS — F411 Generalized anxiety disorder: Secondary | ICD-10-CM | POA: Diagnosis not present

## 2018-03-10 ENCOUNTER — Encounter: Payer: Self-pay | Admitting: Internal Medicine

## 2018-03-10 ENCOUNTER — Ambulatory Visit: Payer: BLUE CROSS/BLUE SHIELD | Admitting: Internal Medicine

## 2018-03-10 VITALS — BP 130/80 | HR 91 | Ht 74.0 in | Wt 235.0 lb

## 2018-03-10 DIAGNOSIS — J4520 Mild intermittent asthma, uncomplicated: Secondary | ICD-10-CM

## 2018-03-10 DIAGNOSIS — G4733 Obstructive sleep apnea (adult) (pediatric): Secondary | ICD-10-CM

## 2018-03-10 NOTE — Progress Notes (Signed)
Name: Cory Espinoza MRN: [id number] DOB: [birth year]     CONSULTATION DATE: 03/10/2018  REFERRING MD :  Annye Asa  CHIEF COMPLAINT:  cough  STUDIES:  CXR 11/18/17 No acute process No opacties   12/2017 EOS levels WNL  PFT 12/2017 Small airways disease FEF 25/75 reduced to 66% predicted Fev1/FVC ratio WNL FVC reduced TLC reduced DLCO NL when corrected for volumes Small airways obstruction with restrictive lung disease from increased weight   CT chest 3.29.19 persistent b/l infiltrates R>L BUT Much improved since last CT scan 12/2017   HISTORY OF PRESENT ILLNESS: 46 yo white male seen today for follow up cough  Follow up pneumonia  Had been treated with steroids/abx several times Feels much better  Has residual cough and intermittent SOB and DOE  Albuterol and FLonase On Symbicort and SPiriva   I have reviowed CT scan with patient Much improved infiltrates  Patient also uses CPAP for underlying OSA AHI 2.9  Patient is a long-term smoker smoked 2-1/2 packs/day for 20 years Patient is a former smoker quit 6 and and half years ago Patient is a Emergency planning/management officer  No signs of infection at this time No CHF at this time  SOCIAL HISTORY:  reports that he quit smoking about 5 years ago. His smoking use included cigarettes. He started smoking about 28 years ago. He has a 46.00 pack-year smoking history. His smokeless tobacco use includes snuff. He reports that he drinks alcohol. He reports that he does not use drugs.  REVIEW OF SYSTEMS:   Constitutional: -malaise/fatigue and -diaphoresis.- lightheadedness HENT: Negative for hearing loss, ear pain, nosebleeds, congestion, sore throat, neck pain, tinnitus and ear discharge.   Eyes: Negative for blurred vision, double vision, photophobia, pain, discharge and redness.  Respiratory: +cough, + shortness of breath Cardiovascular: +chest pain,  Endo/Heme/Allergies: Negative for environmental allergies and polydipsia. Does not  bruise/bleed easily.  ALL OTHER ROS ARE NEGATIVE   BP 130/80 (BP Location: Left Arm, Cuff Size: Normal)   Pulse 91   Ht 6\' 2"  (1.88 m)   Wt 235 lb (106.6 kg)   SpO2 95%   BMI 30.17 kg/m    Physical Examination:   GENERALNAD HEAD: Normocephalic, atraumatic.  EYES: Pupils equal, round, reactive to light. Extraocular muscles intact. No scleral icterus.  MOUTH: Moist mucosal membrane.   EAR, NOSE, THROAT: Clear without exudates. No external lesions.  NECK: Supple. No thyromegaly. No nodules. No JVD.  PULMONARY:CTA B/L no wheezes, no crackles, no rhonchi CARDIOVASCULAR: S1 and S2. Regular rate and rhythm. No murmurs, rubs, or gallops. No edema.  PSYCHIATRIC: Mood, affect within normal limits. The patient is awake, alert and oriented x 3. Insight, judgment intact.     ASSESSMENT / PLAN: 47 year old white male seen today for acute wheezing acute shortness of breath and acute cough related to ongoing small airways disease COPD with allergic rhinits- s/p b/l pneumonia that is resolving with time. Patient also with OSA and seems to be compliant with therapy  1.continue inhalers as prescribed symbicort and spiriva Albuterol as needed Start Spririva Respimat No need for abx or systemic steroids at this time   2.allergic rhinitis  ZYRTEC 10 mg daily Tessolon Perles 200 mg as needed FLonase as prescribed   3.Obesity -recommend significant weight loss -recommend changing diet  4.Deconditioned state -Recommend increased daily activity and exercise   5.pneumonia-resolving No more CT scans needed at this time  6.OSA Will need compliance report at next visit  Patient  satisfied with Plan of  action and management. All questions answered Follow up in 6 months   Cristalle Rohm Santiago Gladavid Lesleyann Fichter, M.D.  [facility]  Medical Director [facility] Medical Director [facility] Cardio-P[facility] Department

## 2018-03-10 NOTE — Patient Instructions (Signed)
Continue inhalers as prescribed Continue CPAP as prescribed 

## 2018-03-14 ENCOUNTER — Telehealth: Payer: Self-pay | Admitting: Internal Medicine

## 2018-03-14 NOTE — Telephone Encounter (Signed)
Patient wants to know if he would be cleared by Dr. Belia HemanKasa to be in a K-9 occupational position . Patient wants to check with Kasa before applying.  Please call to discuss.

## 2018-03-14 NOTE — Telephone Encounter (Signed)
Yes

## 2018-03-14 NOTE — Telephone Encounter (Signed)
Please advise on message below.

## 2018-03-15 NOTE — Telephone Encounter (Signed)
LMOM for pt with DK's response. Informed to call back with any further questions. Nothing further needed.

## 2018-03-21 ENCOUNTER — Ambulatory Visit: Payer: Self-pay | Admitting: Internal Medicine

## 2018-03-21 DIAGNOSIS — G4733 Obstructive sleep apnea (adult) (pediatric): Secondary | ICD-10-CM | POA: Diagnosis not present

## 2018-04-11 ENCOUNTER — Ambulatory Visit: Payer: BLUE CROSS/BLUE SHIELD | Admitting: Family Medicine

## 2018-04-20 DIAGNOSIS — G4733 Obstructive sleep apnea (adult) (pediatric): Secondary | ICD-10-CM | POA: Diagnosis not present

## 2018-05-21 DIAGNOSIS — G4733 Obstructive sleep apnea (adult) (pediatric): Secondary | ICD-10-CM | POA: Diagnosis not present

## 2018-06-03 ENCOUNTER — Telehealth: Payer: Self-pay | Admitting: Internal Medicine

## 2018-06-03 DIAGNOSIS — J209 Acute bronchitis, unspecified: Secondary | ICD-10-CM

## 2018-06-03 MED ORDER — ALBUTEROL SULFATE HFA 108 (90 BASE) MCG/ACT IN AERS: 1.0000 | INHALATION_SPRAY | RESPIRATORY_TRACT | 5 refills | Status: AC | PRN

## 2018-06-03 MED ORDER — TIOTROPIUM BROMIDE MONOHYDRATE 1.25 MCG/ACT IN AERS: 2.0000 | INHALATION_SPRAY | Freq: Two times a day (BID) | RESPIRATORY_TRACT | 5 refills | Status: DC

## 2018-06-03 NOTE — Telephone Encounter (Signed)
Spoke with pt and he wanted to confirm that DK stated that he would be his sleep doctor also. Informed pt I would call [facility] and get him placed in [facility] which I have LM for them to handle that. Pt also states he needs refills on his Albuterol and Spiriva Resp. He states he has his weight down to 229lb form approx. 250lb. Informed pt he wasn't due for a f/u until October. RXs sent. Nothing further needed.

## 2018-06-03 NOTE — Telephone Encounter (Signed)
Pt has some questions regarding his inhaler, he cannot remember the name.

## 2018-06-10 DIAGNOSIS — G4733 Obstructive sleep apnea (adult) (pediatric): Secondary | ICD-10-CM | POA: Diagnosis not present

## 2018-06-14 DIAGNOSIS — G4733 Obstructive sleep apnea (adult) (pediatric): Secondary | ICD-10-CM | POA: Diagnosis not present

## 2018-06-14 DIAGNOSIS — I10 Essential (primary) hypertension: Secondary | ICD-10-CM | POA: Diagnosis not present

## 2018-06-14 DIAGNOSIS — F411 Generalized anxiety disorder: Secondary | ICD-10-CM | POA: Diagnosis not present

## 2018-06-14 DIAGNOSIS — I2510 Atherosclerotic heart disease of native coronary artery without angina pectoris: Secondary | ICD-10-CM | POA: Diagnosis not present

## 2018-06-20 DIAGNOSIS — G4733 Obstructive sleep apnea (adult) (pediatric): Secondary | ICD-10-CM | POA: Diagnosis not present

## 2018-07-18 DIAGNOSIS — I10 Essential (primary) hypertension: Secondary | ICD-10-CM | POA: Diagnosis not present

## 2018-07-18 DIAGNOSIS — F411 Generalized anxiety disorder: Secondary | ICD-10-CM | POA: Diagnosis not present

## 2018-07-21 DIAGNOSIS — G4733 Obstructive sleep apnea (adult) (pediatric): Secondary | ICD-10-CM | POA: Diagnosis not present

## 2018-08-13 ENCOUNTER — Other Ambulatory Visit: Payer: Self-pay | Admitting: Family Medicine

## 2018-08-13 ENCOUNTER — Other Ambulatory Visit: Payer: Self-pay | Admitting: Internal Medicine

## 2018-08-13 DIAGNOSIS — I2510 Atherosclerotic heart disease of native coronary artery without angina pectoris: Secondary | ICD-10-CM

## 2018-08-21 DIAGNOSIS — G4733 Obstructive sleep apnea (adult) (pediatric): Secondary | ICD-10-CM | POA: Diagnosis not present

## 2018-08-27 DIAGNOSIS — Z87891 Personal history of nicotine dependence: Secondary | ICD-10-CM | POA: Insufficient documentation

## 2018-08-27 DIAGNOSIS — I10 Essential (primary) hypertension: Secondary | ICD-10-CM | POA: Insufficient documentation

## 2018-08-27 DIAGNOSIS — F17200 Nicotine dependence, unspecified, uncomplicated: Secondary | ICD-10-CM | POA: Insufficient documentation

## 2018-08-27 NOTE — Progress Notes (Signed)
Cardiology Office Note  Date:  08/29/2018   ID:  Cory Espinoza, DOB 12/23/1970, MRN [id number]  PCP:  Jerl Mina, MD   Chief Complaint  Patient presents with  . other    Ref by Dr. Burnett Sheng for history of [name]; former Dr. Gwen Pounds patient. Meds reviewed by the pt. verbally. Pt. c/o shortness of breath with BP fluctuating.     HPI:  Mr. Cory Espinoza is a 47 year old gentleman with past medical history of Smoker, COPD, quit 5-6 years ago Anxiety Hyperlipidemia [name] HTN OSA 11/2017: PNA Prior withdrawal from xanax 02/2018 Allergy to asa (swelling in throat/face Referred by Dr. Burnett Sheng for consultation of his coronary artery disease, stable angina  CT chest 03/04/2018 CT chest 01/03/2018  We have reviewed his prior cardiac catheterization with him in detail in the office today 04/27/2016 Lesions initially felt to be 70 or 75%, downgraded after FFR  negative FFR of AV groove lesion in OM1 ostial lesion  Ost 2nd Mrg lesion, 60% stenosed.  Mid Cx lesion, 50% stenosed.   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  PSH:    Past Surgical History:  Procedure Laterality Date  . BACK SURGERY  1996  . CARDIAC CATHETERIZATION Left 04/27/2016   Procedure: Left Heart Cath and Coronary Angiography;  Surgeon: Lamar Blinks, MD;  Location: [facility] INVASIVE CV LAB;  Service: Cardiovascular;   Laterality: Left;  . CARDIAC CATHETERIZATION N/A 04/27/2016   Procedure: Intravascular Pressure Wire/FFR Study;  Surgeon: Alwyn Pea, MD;  Location: [facility] INVASIVE CV LAB;  Service: Cardiovascular;  Laterality: N/A;  . CATHETERIZATION OF PULMONARY ARTERY WITH RETRIEVAL OF FOREIGN BODY Bilateral 04/07/2011   heart  . KNEE ARTHROSCOPY Bilateral 2004  . LUMBAR DISC SURGERY  1999    Current Outpatient Medications  Medication Sig Dispense Refill  . albuterol (PROVENTIL HFA;VENTOLIN HFA) 108 (90 Base) MCG/ACT inhaler Inhale 1-2 puffs into the lungs every 4 (four) hours as needed for wheezing or shortness of breath. 1 Inhaler 5  . ALPRAZolam (XANAX) 1 MG tablet Take 1 tablet (1 mg total) by mouth 3 (three) times daily as needed for sleep. (Patient taking differently: Take 2.5 mg by mouth at bedtime as needed for sleep. ) 20 tablet 0  . atorvastatin (LIPITOR) 40 MG tablet Take 1 tablet (40 mg total) by mouth at bedtime. 90 tablet 1  . B COMPLEX VITAMINS PO Take 1 tablet by mouth daily.     . cetirizine (ZYRTEC) 10 MG tablet TAKE 1 TABLET(10 MG) BY MOUTH DAILY 30 tablet 0  . clopidogrel (PLAVIX) 75 MG tablet Take 1 tablet (75 mg total) by mouth daily. 90 tablet 1  . escitalopram (LEXAPRO) 10 MG tablet Take 1 tablet (10 mg total) by mouth daily. (Patient taking differently: Take 20 mg by mouth daily. ) 30 tablet 0  . ipratropium-albuterol (DUONEB) 0.5-2.5 (3) MG/3ML SOLN Take 3 mLs by nebulization  every 4 (four) hours as needed. DX: J44.9 COPD 360 mL 3  . Multiple Vitamins-Minerals (MULTIVITAMIN ADULT PO) Take 1 tablet by mouth daily.    . ondansetron (ZOFRAN ODT) 4 MG disintegrating tablet Take 1 tablet (4 mg total) by mouth every 8 (eight) hours as needed for nausea or vomiting. 20 tablet 0  . pantoprazole (PROTONIX) 40 MG tablet Take 1 tablet (40 mg total) by mouth daily. 30 tablet 10  . POTASSIUM PO Take 1 tablet by mouth daily.    . Saw Palmetto 160 MG CAPS Take 1 capsule by mouth daily.     .  Tiotropium Bromide Monohydrate (SPIRIVA RESPIMAT) 1.25 MCG/ACT AERS Inhale 2 puffs into the lungs 2 (two) times daily. 1 Inhaler 5   No current facility-administered medications for this visit.      Allergies:   Amoxicillin; Aspirin; Codeine; Hydrocodone-acetaminophen; Oxycodone-acetaminophen; Oxycodone-acetaminophen; and Penicillins   Social History:  The patient  reports that he quit smoking about 5 years ago. His smoking use included cigarettes. He started smoking about 28 years ago. He has a 46.00 pack-year smoking history. His smokeless tobacco use includes snuff. He reports that he drinks alcohol. He reports that he does not use drugs.   Family History:   family history includes Cancer in his paternal grandmother; Diabetes in his paternal grandmother and sister; Heart attack (age of onset: 10) in his paternal grandfather; Heart attack (age of onset: 20) in his father; Heart attack (age of onset: 55) in his brother; Heart disease in his father and mother; Hyperlipidemia in his brother and sister; Hypertension in his brother and sister; Supraventricular tachycardia in his brother.    Review of Systems: Review of Systems  Constitutional: Negative.   Respiratory: Negative.   Cardiovascular: Negative.   Gastrointestinal: Negative.   Musculoskeletal: Negative.   Neurological: Negative.   Psychiatric/Behavioral: Negative.   All other systems reviewed and are negative.    PHYSICAL EXAM: VS:  BP 130/78 (BP Location: Right Arm, Patient Position: Sitting, Cuff Size: Normal)   Ht 6' (1.829 m)   Wt 234 lb 12 oz (106.5 kg)   BMI 31.84 kg/m  , BMI Body mass index is 31.84 kg/m. GEN: Well nourished, well developed, in no acute distress  HEENT: normal  Neck: no JVD, carotid bruits, or masses Cardiac: RRR; no murmurs, rubs, or gallops,no edema  Respiratory:  clear to auscultation bilaterally, normal work of breathing GI: soft, nontender, nondistended, + BS MS: no deformity or atrophy   Skin: warm and dry, no rash Neuro:  Strength and sensation are intact Psych: euthymic mood, full affect  Recent Labs: 02/08/2018: BUN 19; Creatinine, Ser 0.86; Hemoglobin 18.1; Platelets 256; Potassium 4.0; Sodium 139    Lipid Panel Lab Results  Component Value Date   CHOL 140 08/10/2017   [name] 39 (L) 08/10/2017   LDLCALC 88 08/10/2017   TRIG 65 08/10/2017      Wt Readings from Last 3 Encounters:  08/29/18 234 lb 12 oz (106.5 kg)  03/10/18 235 lb (106.6 kg)  02/08/18 240 lb (108.9 kg)       ASSESSMENT AND PLAN:  Atherosclerosis of native coronary artery of native heart with stable angina pectoris (HCC) - Plan: EKG 12-Lead Currently with no symptoms of angina. No further workup at this time. Continue current medication regimen. Long discussion concerning previous cardiac catheterization results 2017 Discussed anginal symptoms to watch for  Smoker Stop smoking but is dipping We have encouraged him to continue to work on weaning his  cigarettes and smoking cessation. He will continue to work on this and does not want any assistance with chantix.   Anxiety Managed by primary care  Benign essential HTN - Plan: EKG 12-Lead Currently not on any blood pressure medications  Hyperlipidemia Continue Lipitor 40 daily We will add Zetia 10 mg daily Previous lipid numbers above goal  Morbid  obesity Lost 20 pounds in the past year through diet and exercise  Disposition:   F/U  6 months  Patient was seen in consultation for Dr. Burnett ShengHedrick and will be referred back to his office for ongoing care of the issues detailed above   Total encounter time more than 60 minutes  Greater than 50% was spent in counseling and coordination of care with the patient    Orders Placed This Encounter  Procedures  . EKG 12-Lead     Signed, Dossie Arbourim Gollan, M.D., Ph.D. 08/29/2018  [facility], [place] [phone]

## 2018-08-29 ENCOUNTER — Ambulatory Visit: Payer: BLUE CROSS/BLUE SHIELD | Admitting: Cardiovascular Disease

## 2018-08-29 ENCOUNTER — Encounter

## 2018-08-29 ENCOUNTER — Encounter: Payer: Self-pay | Admitting: Cardiovascular Disease

## 2018-08-29 VITALS — BP 130/78 | Ht 72.0 in | Wt 234.8 lb

## 2018-08-29 DIAGNOSIS — F419 Anxiety disorder, unspecified: Secondary | ICD-10-CM

## 2018-08-29 DIAGNOSIS — F17200 Nicotine dependence, unspecified, uncomplicated: Secondary | ICD-10-CM | POA: Diagnosis not present

## 2018-08-29 DIAGNOSIS — I10 Essential (primary) hypertension: Secondary | ICD-10-CM

## 2018-08-29 DIAGNOSIS — I25118 Atherosclerotic heart disease of native coronary artery with other forms of angina pectoris: Secondary | ICD-10-CM

## 2018-08-29 DIAGNOSIS — E7800 Pure hypercholesterolemia, unspecified: Secondary | ICD-10-CM

## 2018-08-29 MED ORDER — EZETIMIBE 10 MG PO TABS: 10.0000 mg | ORAL_TABLET | Freq: Every day | ORAL | 3 refills | Status: DC

## 2018-08-29 NOTE — Patient Instructions (Signed)
Medication Instructions:   Please start zetia one a day for cholesterol   Labwork:  No new labs needed  Testing/Procedures:  No further testing at this time   Follow-Up: It was a pleasure seeing you in the office today. Please call us if you have new issues that need to be addressed before your next appt.  [phone]  Your physician wants you to follow-up in: 6 months.  You will receive a reminder letter in the mail two months in advance. If you don't receive a letter, please call our office to schedule the follow-up appointment.  If you need a refill on your cardiac medications before your next appointment, please call your pharmacy.  For educational health videos Log in to : [email or web address] Or : [email or web address], password : [email or web address]  

## 2018-09-17 ENCOUNTER — Other Ambulatory Visit: Payer: Self-pay | Admitting: Internal Medicine

## 2018-09-20 DIAGNOSIS — G4733 Obstructive sleep apnea (adult) (pediatric): Secondary | ICD-10-CM | POA: Diagnosis not present

## 2018-09-21 DIAGNOSIS — G4733 Obstructive sleep apnea (adult) (pediatric): Secondary | ICD-10-CM | POA: Diagnosis not present

## 2018-10-04 ENCOUNTER — Ambulatory Visit (INDEPENDENT_AMBULATORY_CARE_PROVIDER_SITE_OTHER): Payer: BLUE CROSS/BLUE SHIELD | Admitting: Internal Medicine

## 2018-10-04 ENCOUNTER — Encounter: Payer: Self-pay | Admitting: Internal Medicine

## 2018-10-04 VITALS — BP 104/60 | HR 81 | Ht 72.0 in | Wt 224.0 lb

## 2018-10-04 DIAGNOSIS — J4520 Mild intermittent asthma, uncomplicated: Secondary | ICD-10-CM

## 2018-10-04 NOTE — Progress Notes (Signed)
Name: Cory Espinoza MRN: [id number] DOB: Aug 06, 1971     CONSULTATION DATE: 10/04/2018  REFERRING MD :  Annye Asa  CHIEF COMPLAINT:  cough  STUDIES:  CXR 11/18/17 No acute process No opacties   12/2017 EOS levels WNL  PFT 12/2017 Small airways disease FEF 25/75 reduced to 66% predicted Fev1/FVC ratio WNL FVC reduced TLC reduced DLCO NL when corrected for volumes Small airways obstruction with restrictive lung disease from increased weight   CT chest 3.29.19 persistent b/l infiltrates R>L BUT Much improved since last CT scan 12/2017  Patient is a long-term smoker smoked 2-1/2 packs/day for 20 years Patient is a former smoker quit 6 and and half years ago Patient is a Emergency planning/management officer  HISTORY OF PRESENT ILLNESS: Patient has no acute issues at this time Respiratory status is stable Had previous history of pneumonia Resolved with antibiotics and steroids  Residual cough On inhalers at this time  No signs of infection No signs of distress No signs of heart failure   Patient has diagnosis of sleep apnea on CPAP therapy AHi down to 3.7 90% compliance for days 63% >4 hrs   SOCIAL HISTORY:  reports that he quit smoking about 5 years ago. His smoking use included cigarettes. He started smoking about 28 years ago. He has a 46.00 pack-year smoking history. His smokeless tobacco use includes snuff. He reports that he drinks alcohol. He reports that he does not use drugs.   Review of Systems:  Gen:  Denies  fever, sweats, chills weigh loss  HEENT: Denies blurred vision, double vision, ear pain, eye pain, hearing loss, nose bleeds, sore throat Cardiac:  No dizziness, chest pain or heaviness, chest tightness,edema, No JVD Resp:   -cough -sputum production, -shortness of breath,-wheezing, -hemoptysis,  Gi: Denies swallowing difficulty, stomach pain, nausea or vomiting, diarrhea, constipation, bowel incontinence Gu:  Denies bladder incontinence, burning urine Ext:    Denies Joint pain, stiffness or swelling Skin: Denies  skin rash, easy bruising or bleeding or hives Endoc:  Denies polyuria, polydipsia , polyphagia or weight change Psych:   Denies depression, insomnia or hallucinations  Other:  All other systems negative   BP 104/60 (BP Location: Left Arm, Cuff Size: Normal)   Pulse 81   Ht 6' (1.829 m)   Wt 224 lb (101.6 kg)   SpO2 98%   BMI 30.38 kg/m  Physical Examination:   GENERAL:NAD, no fevers, chills, no weakness no fatigue HEAD: Normocephalic, atraumatic.  EYES: Pupils equal, round, reactive to light. Extraocular muscles intact. No scleral icterus.  MOUTH: Moist mucosal membrane. Dentition intact. No abscess noted.  EAR, NOSE, THROAT: Clear without exudates. No external lesions.  NECK: Supple. No thyromegaly. No nodules. No JVD.  PULMONARY: CTA B/L no wheezing, rhonchi, crackles CARDIOVASCULAR: S1 and S2. Regular rate and rhythm. No murmurs, rubs, or gallops. No edema. Pedal pulses 2+ bilaterally.  GASTROINTESTINAL: Soft, nontender, nondistended. No masses. Positive bowel sounds. No hepatosplenomegaly.  MUSCULOSKELETAL: No swelling, clubbing, or edema. Range of motion full in all extremities.  NEUROLOGIC: Cranial nerves II through XII are intact. No gross focal neurological deficits. Sensation intact. Reflexes intact.  SKIN: No ulceration, lesions, rashes, or cyanosis. Skin warm and dry. Turgor intact.  PSYCHIATRIC: Mood, affect within normal limits. The patient is awake, alert and oriented x 3. Insight, judgment intact.  ALL OTHER ROS ARE NEGATIVE      ASSESSMENT / PLAN: 47 year old pleasant white male seen today for follow-up for 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 associated  with allergic rhinitis in the setting of previous bilateral pneumonia that has resolved with time with underlying sleep apnea  #1 reactive airways disease small airways disease obstruction Continue Symbicort and Spiriva as prescribed Albuterol  as needed No indication for antibiotics or steroids at this time   #2 allergic rhinitis Zyrtec 10 mg daily Tessalon Perles 200 mg as needed Flonase as prescribed  #3 obesity -recommend significant weight loss -recommend changing diet  #4 deconditioned state -Recommend increased daily activity and exercise   #5 previous history of pneumonia resolved with time No indication for CTs at this time  #6 OSA Continue CPAP as prescribed   Patient satisfied with plan of action Follow-up in 1 year  Darrion Wyszynski [name], M.D.  [name]  Medical Director [facility] Medical Director [facility] Cardio-Pulmonary Department

## 2018-10-04 NOTE — Patient Instructions (Addendum)
Continue with Spiriva as prescribed  Continue albuterol as needed  Avoid any triggers, second hand smoke  Continue CPAP as prescribed  Recommend Dentist for oral device for teeth grinding

## 2018-10-18 DIAGNOSIS — F411 Generalized anxiety disorder: Secondary | ICD-10-CM | POA: Diagnosis not present

## 2018-10-18 DIAGNOSIS — I10 Essential (primary) hypertension: Secondary | ICD-10-CM | POA: Diagnosis not present

## 2018-10-21 DIAGNOSIS — G4733 Obstructive sleep apnea (adult) (pediatric): Secondary | ICD-10-CM | POA: Diagnosis not present

## 2018-10-31 ENCOUNTER — Other Ambulatory Visit: Payer: Self-pay | Admitting: Internal Medicine

## 2018-11-20 DIAGNOSIS — G4733 Obstructive sleep apnea (adult) (pediatric): Secondary | ICD-10-CM | POA: Diagnosis not present

## 2018-12-21 DIAGNOSIS — G4733 Obstructive sleep apnea (adult) (pediatric): Secondary | ICD-10-CM | POA: Diagnosis not present

## 2019-01-09 DIAGNOSIS — M5127 Other intervertebral disc displacement, lumbosacral region: Secondary | ICD-10-CM | POA: Diagnosis not present

## 2019-01-09 DIAGNOSIS — M9903 Segmental and somatic dysfunction of lumbar region: Secondary | ICD-10-CM | POA: Diagnosis not present

## 2019-01-09 DIAGNOSIS — M4607 Spinal enthesopathy, lumbosacral region: Secondary | ICD-10-CM | POA: Diagnosis not present

## 2019-01-09 DIAGNOSIS — M531 Cervicobrachial syndrome: Secondary | ICD-10-CM | POA: Diagnosis not present

## 2019-01-10 DIAGNOSIS — M4607 Spinal enthesopathy, lumbosacral region: Secondary | ICD-10-CM | POA: Diagnosis not present

## 2019-01-10 DIAGNOSIS — M9903 Segmental and somatic dysfunction of lumbar region: Secondary | ICD-10-CM | POA: Diagnosis not present

## 2019-01-10 DIAGNOSIS — M5127 Other intervertebral disc displacement, lumbosacral region: Secondary | ICD-10-CM | POA: Diagnosis not present

## 2019-01-10 DIAGNOSIS — M531 Cervicobrachial syndrome: Secondary | ICD-10-CM | POA: Diagnosis not present

## 2019-01-12 DIAGNOSIS — M4607 Spinal enthesopathy, lumbosacral region: Secondary | ICD-10-CM | POA: Diagnosis not present

## 2019-01-12 DIAGNOSIS — M5127 Other intervertebral disc displacement, lumbosacral region: Secondary | ICD-10-CM | POA: Diagnosis not present

## 2019-01-12 DIAGNOSIS — M9903 Segmental and somatic dysfunction of lumbar region: Secondary | ICD-10-CM | POA: Diagnosis not present

## 2019-01-12 DIAGNOSIS — M531 Cervicobrachial syndrome: Secondary | ICD-10-CM | POA: Diagnosis not present

## 2019-01-16 DIAGNOSIS — M5127 Other intervertebral disc displacement, lumbosacral region: Secondary | ICD-10-CM | POA: Diagnosis not present

## 2019-01-16 DIAGNOSIS — M531 Cervicobrachial syndrome: Secondary | ICD-10-CM | POA: Diagnosis not present

## 2019-01-16 DIAGNOSIS — M9903 Segmental and somatic dysfunction of lumbar region: Secondary | ICD-10-CM | POA: Diagnosis not present

## 2019-01-16 DIAGNOSIS — M4607 Spinal enthesopathy, lumbosacral region: Secondary | ICD-10-CM | POA: Diagnosis not present

## 2019-01-19 DIAGNOSIS — M4607 Spinal enthesopathy, lumbosacral region: Secondary | ICD-10-CM | POA: Diagnosis not present

## 2019-01-19 DIAGNOSIS — M5127 Other intervertebral disc displacement, lumbosacral region: Secondary | ICD-10-CM | POA: Diagnosis not present

## 2019-01-19 DIAGNOSIS — M9903 Segmental and somatic dysfunction of lumbar region: Secondary | ICD-10-CM | POA: Diagnosis not present

## 2019-01-19 DIAGNOSIS — M531 Cervicobrachial syndrome: Secondary | ICD-10-CM | POA: Diagnosis not present

## 2019-01-21 DIAGNOSIS — G4733 Obstructive sleep apnea (adult) (pediatric): Secondary | ICD-10-CM | POA: Diagnosis not present

## 2019-01-24 DIAGNOSIS — M5127 Other intervertebral disc displacement, lumbosacral region: Secondary | ICD-10-CM | POA: Diagnosis not present

## 2019-01-24 DIAGNOSIS — M531 Cervicobrachial syndrome: Secondary | ICD-10-CM | POA: Diagnosis not present

## 2019-01-24 DIAGNOSIS — M9903 Segmental and somatic dysfunction of lumbar region: Secondary | ICD-10-CM | POA: Diagnosis not present

## 2019-01-24 DIAGNOSIS — M4607 Spinal enthesopathy, lumbosacral region: Secondary | ICD-10-CM | POA: Diagnosis not present

## 2019-01-27 ENCOUNTER — Other Ambulatory Visit: Payer: Self-pay | Admitting: Pulmonary Disease

## 2019-01-27 ENCOUNTER — Other Ambulatory Visit: Payer: Self-pay | Admitting: Internal Medicine

## 2019-02-02 DIAGNOSIS — M531 Cervicobrachial syndrome: Secondary | ICD-10-CM | POA: Diagnosis not present

## 2019-02-02 DIAGNOSIS — M5127 Other intervertebral disc displacement, lumbosacral region: Secondary | ICD-10-CM | POA: Diagnosis not present

## 2019-02-02 DIAGNOSIS — M4607 Spinal enthesopathy, lumbosacral region: Secondary | ICD-10-CM | POA: Diagnosis not present

## 2019-02-02 DIAGNOSIS — M9903 Segmental and somatic dysfunction of lumbar region: Secondary | ICD-10-CM | POA: Diagnosis not present

## 2019-02-10 DIAGNOSIS — M4607 Spinal enthesopathy, lumbosacral region: Secondary | ICD-10-CM | POA: Diagnosis not present

## 2019-02-10 DIAGNOSIS — M9903 Segmental and somatic dysfunction of lumbar region: Secondary | ICD-10-CM | POA: Diagnosis not present

## 2019-02-10 DIAGNOSIS — M5127 Other intervertebral disc displacement, lumbosacral region: Secondary | ICD-10-CM | POA: Diagnosis not present

## 2019-02-10 DIAGNOSIS — M531 Cervicobrachial syndrome: Secondary | ICD-10-CM | POA: Diagnosis not present

## 2019-02-19 DIAGNOSIS — G4733 Obstructive sleep apnea (adult) (pediatric): Secondary | ICD-10-CM | POA: Diagnosis not present

## 2019-02-27 ENCOUNTER — Other Ambulatory Visit: Payer: Self-pay | Admitting: Family Medicine

## 2019-02-27 DIAGNOSIS — I2510 Atherosclerotic heart disease of native coronary artery without angina pectoris: Secondary | ICD-10-CM

## 2019-02-27 DIAGNOSIS — E7800 Pure hypercholesterolemia, unspecified: Secondary | ICD-10-CM

## 2019-03-22 DIAGNOSIS — G4733 Obstructive sleep apnea (adult) (pediatric): Secondary | ICD-10-CM | POA: Diagnosis not present

## 2019-03-28 ENCOUNTER — Other Ambulatory Visit: Payer: Self-pay | Admitting: Family Medicine

## 2019-03-28 DIAGNOSIS — E7800 Pure hypercholesterolemia, unspecified: Secondary | ICD-10-CM

## 2019-03-28 DIAGNOSIS — I2510 Atherosclerotic heart disease of native coronary artery without angina pectoris: Secondary | ICD-10-CM

## 2019-04-06 DIAGNOSIS — I2510 Atherosclerotic heart disease of native coronary artery without angina pectoris: Secondary | ICD-10-CM | POA: Diagnosis not present

## 2019-04-06 DIAGNOSIS — J4520 Mild intermittent asthma, uncomplicated: Secondary | ICD-10-CM | POA: Diagnosis not present

## 2019-04-06 DIAGNOSIS — R52 Pain, unspecified: Secondary | ICD-10-CM | POA: Diagnosis not present

## 2019-04-06 DIAGNOSIS — R0602 Shortness of breath: Secondary | ICD-10-CM | POA: Diagnosis not present

## 2019-04-18 DIAGNOSIS — F411 Generalized anxiety disorder: Secondary | ICD-10-CM | POA: Diagnosis not present

## 2019-04-18 DIAGNOSIS — I2510 Atherosclerotic heart disease of native coronary artery without angina pectoris: Secondary | ICD-10-CM | POA: Diagnosis not present

## 2019-04-18 DIAGNOSIS — I10 Essential (primary) hypertension: Secondary | ICD-10-CM | POA: Diagnosis not present

## 2019-04-18 DIAGNOSIS — J449 Chronic obstructive pulmonary disease, unspecified: Secondary | ICD-10-CM | POA: Diagnosis not present

## 2019-04-23 ENCOUNTER — Other Ambulatory Visit: Payer: Self-pay | Admitting: Internal Medicine

## 2019-05-22 DIAGNOSIS — S61411A Laceration without foreign body of right hand, initial encounter: Secondary | ICD-10-CM | POA: Diagnosis not present

## 2019-08-15 ENCOUNTER — Encounter: Payer: Self-pay | Admitting: Cardiovascular Disease

## 2019-09-06 ENCOUNTER — Telehealth: Payer: Self-pay | Admitting: Cardiovascular Disease

## 2019-09-06 NOTE — Telephone Encounter (Signed)
Virtual Visit Pre-Appointment Phone Call  "(Name), I am calling you today to discuss your upcoming appointment. We are currently trying to limit exposure to the virus that causes COVID-19 by seeing patients at home rather than in the office."  1. "What is the BEST phone number to call the day of the visit?" - include this in appointment notes  2. Do you have or have access to (through a family member/friend) a smartphone with video capability that we can use for your visit?" a. If yes - list this number in appt notes as cell (if different from BEST phone #) and list the appointment type as a VIDEO visit in appointment notes b. If no - list the appointment type as a PHONE visit in appointment notes  3. Confirm consent - "In the setting of the current Covid19 crisis, you are scheduled for a (phone or video) visit with your provider on (date) at (time).  Just as we do with many in-office visits, in order for you to participate in this visit, we must obtain consent.  If you'd like, I can send this to your mychart (if signed up) or email for you to review.  Otherwise, I can obtain your verbal consent now.  All virtual visits are billed to your insurance company just like a normal visit would be.  By agreeing to a virtual visit, we'd like you to understand that the technology does not allow for your provider to perform an examination, and thus may limit your provider's ability to fully assess your condition. If your provider identifies any concerns that need to be evaluated in person, we will make arrangements to do so.  Finally, though the technology is pretty good, we cannot assure that it will always work on either your or our end, and in the setting of a video visit, we may have to convert it to a phone-only visit.  In either situation, we cannot ensure that we have a secure connection.  Are you willing to proceed?" STAFF: Did the patient verbally acknowledge consent to telehealth visit? Document  YES/NO here: YES  4. Advise patient to be prepared - "Two hours prior to your appointment, go ahead and check your blood pressure, pulse, oxygen saturation, and your weight (if you have the equipment to check those) and write them all down. When your visit starts, your provider will ask you for this information. If you have an Apple Watch or Kardia device, please plan to have heart rate information ready on the day of your appointment. Please have a pen and paper handy nearby the day of the visit as well."  5. Give patient instructions for MyChart download to smartphone OR Doximity/Doxy.me as below if video visit (depending on what platform provider is using)  6. Inform patient they will receive a phone call 15 minutes prior to their appointment time (may be from unknown caller ID) so they should be prepared to answer    TELEPHONE CALL NOTE  AVIS MCMAHILL has been deemed a candidate for a follow-up tele-health visit to limit community exposure during the Covid-19 pandemic. I spoke with the patient via phone to ensure availability of phone/video source, confirm preferred email & phone number, and discuss instructions and expectations.  I reminded NEHEMYAH FOUSHEE to be prepared with any vital sign and/or heart rhythm information that could potentially be obtained via home monitoring, at the time of his visit. I reminded GURKARAN RAHM to expect a phone call prior to  his visit.  Morrie Sheldon Gerringer 09/06/2019 2:03 PM   INSTRUCTIONS FOR DOWNLOADING THE MYCHART APP TO SMARTPHONE  - The patient must first make sure to have activated MyChart and know their login information - If Apple, go to [employer] and type in MyChart in the search bar and download the app. If Android, ask patient to go to [employer] and type in [place] in the search bar and download the app. The app is free but as with any other app downloads, their phone may require them to verify saved payment information or  Apple/Android password.  - The patient will need to then log into the app with their MyChart username and password, and select [facility] as their healthcare provider to link the account. When it is time for your visit, go to the MyChart app, find appointments, and click Begin Video Visit. Be sure to Select Allow for your device to access the Microphone and Camera for your visit. You will then be connected, and your provider will be with you shortly.  **If they have any issues connecting, or need assistance please contact MyChart service desk [phone])**  **If using a computer, in order to ensure the best quality for their visit they will need to use either of the following Internet Browsers: [employer] or Google Chrome**  IF USING DOXIMITY or DOXY.ME - The patient will receive a link just prior to their visit by text.     FULL LENGTH CONSENT FOR TELE-HEALTH VISIT   I hereby voluntarily request, consent and authorize [facility] and its employed or contracted physicians, physician assistants, nurse practitioners or other licensed health care professionals (the Practitioner), to provide me with telemedicine health care services (the Services") as deemed necessary by the treating Practitioner. I acknowledge and consent to receive the Services by the Practitioner via telemedicine. I understand that the telemedicine visit will involve communicating with the Practitioner through live audiovisual communication technology and the disclosure of certain medical information by electronic transmission. I acknowledge that I have been given the opportunity to request an in-person assessment or other available alternative prior to the telemedicine visit and am voluntarily participating in the telemedicine visit.  I understand that I have the right to withhold or withdraw my consent to the use of telemedicine in the course of my care at any time, without affecting my right to future care  or treatment, and that the Practitioner or I may terminate the telemedicine visit at any time. I understand that I have the right to inspect all information obtained and/or recorded in the course of the telemedicine visit and may receive copies of available information for a reasonable fee.  I understand that some of the potential risks of receiving the Services via telemedicine include:   Delay or interruption in medical evaluation due to technological equipment failure or disruption;  Information transmitted may not be sufficient (e.g. poor resolution of images) to allow for appropriate medical decision making by the Practitioner; and/or   In rare instances, security protocols could fail, causing a breach of personal health information.  Furthermore, I acknowledge that it is my responsibility to provide information about my medical history, conditions and care that is complete and accurate to the best of my ability. I acknowledge that Practitioner's advice, recommendations, and/or decision may be based on factors not within their control, such as incomplete or inaccurate data provided by me or distortions of diagnostic images or specimens that may result from electronic transmissions. I understand  that the practice of medicine is not an exact science and that Practitioner makes no warranties or guarantees regarding treatment outcomes. I acknowledge that I will receive a copy of this consent concurrently upon execution via email to the email address I last provided but may also request a printed copy by calling the office of [facility].    I understand that my insurance will be billed for this visit.   I have read or had this consent read to me.  I understand the contents of this consent, which adequately explains the benefits and risks of the Services being provided via telemedicine.   I have been provided ample opportunity to ask questions regarding this consent and the Services and have had  my questions answered to my satisfaction.  I give my informed consent for the services to be provided through the use of telemedicine in my medical care  By participating in this telemedicine visit I agree to the above.

## 2019-09-11 NOTE — Progress Notes (Unsigned)
Patient came by office Dropped off notes from hospital in [place] to be reviewed Patient has virtual visit on 10/7 Placed in nurse box

## 2019-09-12 NOTE — Progress Notes (Signed)
Virtual Visit via Video Note   This visit type was conducted due to national recommendations for restrictions regarding the COVID-19 Pandemic (e.g. social distancing) in an effort to limit this patient's exposure and mitigate transmission in our community.  Due to his co-morbid illnesses, this patient is at least at moderate risk for complications without adequate follow up.  This format is felt to be most appropriate for this patient at this time.  All issues noted in this document were discussed and addressed.  A limited physical exam was performed with this format.  Please refer to the patient's chart for his consent to telehealth for [facility].   I connected with  Cory Espinoza on 09/13/19 by a video enabled telemedicine application and verified that I am speaking with the correct person using two identifiers. I discussed the limitations of evaluation and management by telemedicine. The patient expressed understanding and agreed to proceed.   Evaluation Performed:  Follow-up visit  Date:  09/13/2019   ID:  Cory Espinoza, DOB March 17, 1971, MRN [id number]  Patient Location:  [address] [place] [address]   Provider location:   [facility], [place] office  PCP:  Maryland Pink, MD  Cardiologist:  Patsy Baltimore   Chief Complaint:  Chest pain   History of Present Illness:    Cory Espinoza is a 48 y.o. male who presents via audio/video conferencing for a telehealth visit today.   The patient does not symptoms concerning for COVID-19 infection (fever, chills, cough, or new SHORTNESS OF BREATH).   Patient has a past medical history of Smoker, COPD, quit 6 years ago Anxiety Hyperlipidemia [name] HTN OSA 11/2017: PNA Prior withdrawal from xanax 02/2018 Allergy to asa (swelling in throat/face Who presents for follow-up of his coronary artery disease, stable angina  Went to [place] for [employer] training Weight down 30 pounds Changed eating habits   GI distress in [place],  In the heat Vomiting, Diaphoretic Chest pain, Took NTG Went to hospital enz "elevated" Dehydrated Checked out AMA, weak 3-4 days Had indigestion for a few days --- Hospital records reviewed from the emergency room, troponin negative x2, CPK was in the 300s He was given 2 L IV fluid Other labs look normal, creatinine was 1.0, normal CBC  Now feels good, "have not slowed down" Did 2 mile "track", no NTG, felt ok  CT chest 03/04/2018 CT chest 01/03/2018  cardiac catheterization  04/27/2016 Lesions initially felt to be 70 or 75%, downgraded after FFR  negative FFR of AV groove lesion in OM1 ostial lesion  Ost 2nd Mrg lesion, 60% stenosed.  Mid Cx lesion, 50% stenosed.   Prior review of lab work shows Total cholesterol 148 LDL 88 Borderline sugars, glucose 100  Reports that he is no longer smoking but does dip   Prior CV studies:   The following studies were reviewed today:    Past Medical History:  Diagnosis Date  . Allergy   . Anxiety   . COPD (chronic obstructive pulmonary disease) ([place])   . Coronary artery disease   . Hyperlipidemia   . Hypertension    Past Surgical History:  Procedure Laterality Date  . BACK SURGERY  1996  . CARDIAC CATHETERIZATION Left 04/27/2016   Procedure: Left Heart Cath and Coronary Angiography;  Surgeon: Corey Skains, MD;  Location: [facility];  Service: Cardiovascular;  Laterality: Left;  . CARDIAC CATHETERIZATION N/A 04/27/2016   Procedure: Intravascular Pressure Wire/FFR Study;  Surgeon: Alwyn Peawayne D Callwood, MD;  Location: [facility] INVASIVE CV LAB;  Service: Cardiovascular;  Laterality: N/A;  . CATHETERIZATION OF PULMONARY ARTERY WITH RETRIEVAL OF FOREIGN BODY Bilateral 04/07/2011   heart  . KNEE ARTHROSCOPY Bilateral 2004  . LUMBAR DISC SURGERY  1999      Allergies:   Amoxicillin, Aspirin, Codeine, Hydrocodone-acetaminophen, Oxycodone-acetaminophen, Oxycodone-acetaminophen, and Penicillins    Social History   Tobacco Use  . Smoking status: Former Smoker    Packs/day: 2.00    Years: 23.00    Pack years: 46.00    Types: Cigarettes    Start date: 10/28/1989    Quit date: 10/28/2012    Years since quitting: 6.8  . Smokeless tobacco: Current User    Types: Snuff  Substance Use Topics  . Alcohol use: Yes    Alcohol/week: 0.0 standard drinks    Comment: rarely  . Drug use: No     Current Outpatient Medications on File Prior to Visit  Medication Sig Dispense Refill  . albuterol (PROVENTIL HFA;VENTOLIN HFA) 108 (90 Base) MCG/ACT inhaler Inhale 1-2 puffs into the lungs every 4 (four) hours as needed for wheezing or shortness of breath. 1 Inhaler 5  . atorvastatin (LIPITOR) 40 MG tablet Take 1 tablet (40 mg total) by mouth at bedtime. 90 tablet 1  . B COMPLEX VITAMINS PO Take 1 tablet by mouth daily.     . benzonatate (TESSALON) 100 MG capsule TAKE 1 CAPSULE(100 MG) BY MOUTH THREE TIMES DAILY AS NEEDED FOR COUGH 90 capsule 1  . cetirizine (ZYRTEC) 10 MG tablet TAKE 1 TABLET(10 MG) BY MOUTH DAILY 90 tablet 1  . clopidogrel (PLAVIX) 75 MG tablet Take 1 tablet (75 mg total) by mouth daily. 90 tablet 1  . escitalopram (LEXAPRO) 20 MG tablet Take 20 mg by mouth daily.    Marland Kitchen. ipratropium-albuterol (DUONEB) 0.5-2.5 (3) MG/3ML SOLN Take 3 mLs by nebulization every 4 (four) hours as needed. DX: J44.9 COPD 360 mL 3  . Multiple Vitamins-Minerals (MULTIVITAMIN ADULT PO) Take 1 tablet by mouth daily.    . pantoprazole (PROTONIX) 40 MG tablet TAKE 1 TABLET(40 MG) BY MOUTH DAILY 30 tablet 10  . POTASSIUM PO Take 1 tablet by mouth daily.    . Saw Palmetto 160 MG CAPS Take 1 capsule by mouth daily.     . Tiotropium Bromide Monohydrate (SPIRIVA RESPIMAT) 1.25 MCG/ACT AERS Inhale 2 puffs into the lungs 2 (two) times daily. 1 Inhaler 5   No current facility-administered medications on file prior to visit.     Family Hx: The patient's family history includes Cancer in his paternal grandmother;  Diabetes in his paternal grandmother and sister; Heart attack (age of onset: 7736) in his paternal grandfather; Heart attack (age of onset: 6338) in his father; Heart attack (age of onset: 6845) in his brother; Heart disease in his father and mother; Hyperlipidemia in his brother and sister; Hypertension in his brother and sister; Supraventricular tachycardia in his brother.  ROS:   Please see the history of present illness.    Review of Systems  Constitutional: Negative.   HENT: Negative.   Respiratory: Negative.   Cardiovascular: Negative.   Gastrointestinal: Negative.   Musculoskeletal: Negative.   Neurological: Negative.   Psychiatric/Behavioral: Negative.   All other systems reviewed and are negative.    Labs/Other Tests and Data Reviewed:    Recent Labs: No results found for requested labs within last 8760 hours.   Recent Lipid Panel Lab Results  Component Value Date/Time  CHOL 140 08/10/2017 09:38 AM   TRIG 65 08/10/2017 09:38 AM   [name] 39 (L) 08/10/2017 09:38 AM   CHOLHDL 3.4 05/01/2016 09:08 AM   LDLCALC 88 08/10/2017 09:38 AM    Wt Readings from Last 3 Encounters:  09/13/19 216 lb (98 kg)  10/04/18 224 lb (101.6 kg)  08/29/18 234 lb 12 oz (106.5 kg)     Exam:    Vital Signs: Vital signs may also be detailed in the HPI Ht 6' (1.829 m)   Wt 216 lb (98 kg)   BMI 29.29 kg/m   Wt Readings from Last 3 Encounters:  09/13/19 216 lb (98 kg)  10/04/18 224 lb (101.6 kg)  08/29/18 234 lb 12 oz (106.5 kg)   Temp Readings from Last 3 Encounters:  02/08/18 99.2 F (37.3 C) (Oral)  02/04/18 (!) 97.4 F (36.3 C) (Oral)  11/25/17 97.9 F (36.6 C) (Oral)   BP Readings from Last 3 Encounters:  10/04/18 104/60  08/29/18 130/78  03/10/18 130/80   Pulse Readings from Last 3 Encounters:  10/04/18 81  03/10/18 91  02/08/18 66     Well nourished, well developed male in no acute distress. Constitutional:  oriented to person, place, and time. No distress.    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 (HCC) - Primary   Relevant Medications   escitalopram (LEXAPRO) 20 MG tablet   Benign essential HTN   Hyperlipidemia (Chronic)     Other   Morbid obesity (HCC)   Smoker   Anxiety   Relevant Medications   escitalopram (LEXAPRO) 20 MG tablet      [name] with stable angina Discussed recent chest pain episode while he was in [place], Cardiac enzymes negative, likely musculoskeletal strain from vomiting Was diaphoretic in the setting of dehydration and being in the heat Weight down, 216 pounds Will check labs with PMD Was previously prescribed Zetia but did not fill it, lost the prescription Recommended we recheck the cholesterol panel through primary care then make a decision on whether to start Zetia  HTN Blood pressure is well controlled on today's visit. No changes made to the medications. Weight trending down  Morbid obesity Weight down, active Very active, walking 2 miles without any anginal symptoms   COVID-19 Education: The signs and symptoms of COVID-19 were discussed with the patient and how to seek care for testing (follow up with PCP or arrange E-visit).  The importance of social distancing was discussed today.  Patient Risk:   After full review of this patients clinical status, I feel that they are at least moderate risk at this time.  Time:   Today, I have spent 25 minutes with the patient with telehealth technology discussing the cardiac and medical problems/diagnoses detailed above   Additional 10 min spent reviewing the chart prior to patient visit today   Medication Adjustments/Labs and Tests Ordered: Current medicines are reviewed at length with the patient today.  Concerns regarding medicines are outlined above.   Tests Ordered: No tests ordered   Medication Changes: No changes made    Disposition: Follow-up in 12 months   Signed, Julien Nordmann, MD  [facility] [address], [place], [place] [address]

## 2019-09-13 ENCOUNTER — Telehealth (INDEPENDENT_AMBULATORY_CARE_PROVIDER_SITE_OTHER): Payer: BLUE CROSS/BLUE SHIELD | Admitting: Cardiovascular Disease

## 2019-09-13 ENCOUNTER — Other Ambulatory Visit: Payer: Self-pay

## 2019-09-13 VITALS — Ht 72.0 in | Wt 216.0 lb

## 2019-09-13 DIAGNOSIS — F419 Anxiety disorder, unspecified: Secondary | ICD-10-CM

## 2019-09-13 DIAGNOSIS — F17200 Nicotine dependence, unspecified, uncomplicated: Secondary | ICD-10-CM

## 2019-09-13 DIAGNOSIS — I25118 Atherosclerotic heart disease of native coronary artery with other forms of angina pectoris: Secondary | ICD-10-CM

## 2019-09-13 DIAGNOSIS — E7800 Pure hypercholesterolemia, unspecified: Secondary | ICD-10-CM

## 2019-09-13 DIAGNOSIS — I10 Essential (primary) hypertension: Secondary | ICD-10-CM

## 2019-09-13 NOTE — Patient Instructions (Addendum)
Medication Instructions:  No changes  If you need a refill on your cardiac medications before your next appointment, please call your pharmacy.    Lab work: No new labs needed   If you have labs (blood work) drawn today and your tests are completely normal, you will receive your results only by: . MyChart Message (if you have MyChart) OR . A paper copy in the mail If you have any lab test that is abnormal or we need to change your treatment, we will call you to review the results.   Testing/Procedures: No new testing needed   Follow-Up: At [facility], you and your health needs are our priority.  As part of our continuing mission to provide you with exceptional heart care, we have created designated Provider Care Teams.  These Care Teams include your primary Cardiologist (physician) and Advanced Practice Providers (APPs -  Physician Assistants and Nurse Practitioners) who all work together to provide you with the care you need, when you need it.  . You will need a follow up appointment in 12 months (October 2021) .   Please call our office 2 months in advance to schedule this appointment.  (Call in early August 2021 to schedule)  . Providers on your designated Care Team:   . Christopher Berge, NP . Ryan Dunn, PA-C . Jacquelyn Visser, PA-C  Any Other Special Instructions Will Be Listed Below (If Applicable).  For educational health videos Log in to : [email or web address] Or : [email or web address], password : [email or web address]   

## 2019-09-15 ENCOUNTER — Other Ambulatory Visit: Payer: Self-pay

## 2019-09-15 ENCOUNTER — Ambulatory Visit: Payer: Self-pay | Admitting: Adult Health

## 2019-09-15 ENCOUNTER — Encounter: Payer: Self-pay | Admitting: Adult Health

## 2019-09-15 VITALS — BP 120/74 | HR 67 | Temp 97.2°F | Resp 18

## 2019-09-15 DIAGNOSIS — L249 Irritant contact dermatitis, unspecified cause: Secondary | ICD-10-CM

## 2019-09-15 DIAGNOSIS — R21 Rash and other nonspecific skin eruption: Secondary | ICD-10-CM

## 2019-09-15 LAB — COMPREHENSIVE METABOLIC PANEL
ALT: 21 IU/L (ref 0–44)
AST: 20 IU/L (ref 0–40)
Albumin/Globulin Ratio: 2 (ref 1.2–2.2)
Albumin: 4.7 g/dL (ref 4.0–5.0)
Alkaline Phosphatase: 100 IU/L (ref 39–117)
BUN/Creatinine Ratio: 15 (ref 9–20)
BUN: 14 mg/dL (ref 6–24)
Bilirubin Total: 0.5 mg/dL (ref 0.0–1.2)
CO2: 23 mmol/L (ref 20–29)
Calcium: 9.3 mg/dL (ref 8.7–10.2)
Chloride: 101 mmol/L (ref 96–106)
Creatinine, Ser: 0.95 mg/dL (ref 0.76–1.27)
GFR calc Af Amer: 110 mL/min/{1.73_m2} (ref >59)
GFR calc non Af Amer: 95 mL/min/{1.73_m2} (ref >59)
Globulin, Total: 2.4 g/dL (ref 1.5–4.5)
Glucose: 90 mg/dL (ref 65–99)
Potassium: 4.3 mmol/L (ref 3.5–5.2)
Sodium: 139 mmol/L (ref 134–144)
Total Protein: 7.1 g/dL (ref 6.0–8.5)

## 2019-09-15 LAB — CBC WITH DIFFERENTIAL/PLATELET
Basophils Absolute: 0 10*3/uL (ref 0.0–0.2)
Basos: 1 %
EOS (ABSOLUTE): 0.3 10*3/uL (ref 0.0–0.4)
Eos: 4 %
Hematocrit: 48.5 % (ref 37.5–51.0)
Hemoglobin: 16.1 g/dL (ref 13.0–17.7)
Immature Grans (Abs): 0 10*3/uL (ref 0.0–0.1)
Immature Granulocytes: 0 %
Lymphocytes Absolute: 2.2 10*3/uL (ref 0.7–3.1)
Lymphs: 33 %
MCH: 32.3 pg (ref 26.6–33.0)
MCHC: 33.2 g/dL (ref 31.5–35.7)
MCV: 97 fL (ref 79–97)
Monocytes Absolute: 0.5 10*3/uL (ref 0.1–0.9)
Monocytes: 7 %
Neutrophils Absolute: 3.6 10*3/uL (ref 1.4–7.0)
Neutrophils: 55 %
Platelets: 200 10*3/uL (ref 150–450)
RBC: 4.99 x10E6/uL (ref 4.14–5.80)
RDW: 12.5 % (ref 11.6–15.4)
WBC: 6.5 10*3/uL (ref 3.4–10.8)

## 2019-09-15 MED ORDER — PREDNISONE 10 MG (48) PO TBPK: ORAL_TABLET | ORAL | 0 refills | Status: DC

## 2019-09-15 NOTE — Progress Notes (Signed)
[facility]   Patient ID: Cory Espinoza DOB: 48 y.o. 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  He continues to have on itchy rash on bilateral legs, on chest,  Back of neck, torso, groin.   He denies any drainage from his rash.  He has taken some Benadryl at home occasionally, this has helped relieve the itching.  He is taking Zyrtec 10 mg daily for allergies.  He ports he did get poison ivy as a kid and it was pretty severe. He has never had any sort of anaphylactic reaction he reports, he has no throat swelling or tightness with this rash.  Denies lip swelling.  Denies any previous rash. No new medications over the counter or prescription.  He denies any known tick bite.  Denies any other insect bites known.  He has multiple chronic medical conditions, however he reports that they are well controlled at this time.  He ran 2-1/2 miles last night without any difficulty, though he did get hot and sweaty and that seemed to make the rash worse. He also been taking hot showers because he thought that maybe he had chiggers and he was told that he should do so, he reports that this also seemed to make the rash itch more. Patient  denies any fever, body aches,chills, rash, chest pain, shortness of breath, nausea, vomiting, or diarrhea.  He denies any current shortness of breath, or chest pain currently. He denies being around any ill contacts, though he was in the hospital over 3 weeks ago.  Has no respiratory symptoms, no gastrointestinal symptoms currently. 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 Rockey Situ recently October 7 without any changes.  He was also given nitroglycerin when he was in the hospital during his [place] day.  He has a primary care Dr. Tawnya Crook who he reports he sees regularly. He reports he is up-to-date on his immunizations. He has multiple allergies to medications, including amoxicillin aspirin codeine hydrocodone acetaminophen, oxycodone acetaminophen, and penicillins. He does smoke.    Objective: Blood pressure 120/74, pulse 67, temperature (!) 97.2 F (36.2 C), temperature source Temporal, resp. rate 18, SpO2 97 %. temporal thermometer is reading one degree lower than oral thermometer in this clinic.  Patient moves on and off of exam table and in room without difficulty. Gait is normal in hall and in room. Patient is oriented to person place time and situation. Patient answers questions appropriately and engages in conversation.   Physical Exam  Constitutional: He is oriented to person, place, and time. He appears well-developed and well-nourished. No distress.  HENT:  Head: Normocephalic and atraumatic.  Nose: Nose normal.  Mouth/Throat: Oropharynx is clear and moist. No oropharyngeal exudate.  Eyes: Pupils are equal, round, and reactive to light.  Neck: Normal range of motion. Neck supple.  Cardiovascular: Normal rate, regular rhythm, normal heart sounds and intact distal pulses. Exam reveals no gallop and no friction rub.  No murmur heard. Pulmonary/Chest: Effort normal and breath sounds normal.  No respiratory distress. He has no wheezes. He has no rales. He exhibits no tenderness.  Abdominal: Soft. Bowel sounds are normal. He exhibits no distension and no mass. There is no abdominal tenderness. There is no rebound and no guarding.  Musculoskeletal: Normal range of motion.        General: No tenderness, deformity or edema.  Lymphadenopathy:       Head (right side): No submental, no submandibular,  no tonsillar, no preauricular, no posterior auricular and no occipital adenopathy present.       Head (left side): No submental, no submandibular, no tonsillar, no preauricular, no posterior auricular and no occipital adenopathy present.    He has no cervical adenopathy.       Right cervical: No superficial cervical, no deep cervical and no posterior cervical adenopathy present.      Left cervical: No superficial cervical and no posterior cervical adenopathy present.  Neurological: He is alert and oriented to person, place, and time.  Skin: Skin is warm, dry and intact. Rash noted. Rash is maculopapular. He is not diaphoretic. No cyanosis. Nails show no clubbing.     Patient declined groin exam- he notes rash is same as his arms and legs and other areas involved. Denies any signs of infection. Chaperon available.   Pruritic rash on areas documented on diagram. Area to right anterior inner lower leg is a small patch of macular papular erythema( patient reports he has been scratching that area more) no weeping or signs of infection noted.  This area on his lower right leg appears to be more consistent with poison oak.  Other areas appear to be contact dermatitis, possible poison oak and not as irritated as other areas mild erythema and scattered rash in other areas.  2+ bilateral dorsalis pedis and posterior tibial pulses present.  He denies any calf pain.   Psychiatric: He has a normal mood and affect. His behavior is normal. Judgment and thought content normal.      Assessment: Rash and nonspecific skin eruption - Plan: CBC with Differential/Platelet, Comp Met (CMET)  Irritant contact dermatitis, unspecified trigger - Plan: CBC with Differential/Platelet, Comp Met (CMET)     Plan:  Orders Placed This Encounter  Procedures  . CBC with Differential/Platelet  . Comp Met (CMET)    Treatment options discussed, as well as risks, benefits, alternatives. Patient voiced understanding and  agreement with the following plans:  Discussed continue to take Zyrtec 10 mg daily, he is also able to add Benadryl per package instructions, may take up to 50 mg of oral Benadryl if needed.  He is advised to follow up with his primary care provider if symptoms persist, change or worsen.   New Prescriptions   PREDNISONE (STERAPRED UNI-PAK 48 TAB) 10 MG (48) TBPK TABLET    Take PO 60 mg day 1 &2, 50 mg on day 3& 4, 40 mg day 5 & 6 , 30 mg day 7& 8 ,65m day 9&10, 10 mg day 11&12    Advised patient call the office or your primary care doctor for an appointment if no improvement within 72 hours or if any symptoms change or worsen at any time  Advised ER or urgent Care if after hours or on weekend. Call 911 for emergency symptoms at any time.Patinet verbalized understanding of all instructions given/reviewed and treatment plan and has no further questions or concerns at this time.   Precautions discussed. Red flags discussed. Questions invited and answered. Patient voiced understanding and  agreement.  Advised patient call the office or your primary care doctor for an appointment if no improvement within 72 hours or if any symptoms change or worsen at any time  Advised ER or urgent Care if after hours or on weekend. Call 911 for emergency symptoms at any time.Patinet verbalized understanding of all instructions given/reviewed and treatment plan and has no further questions or concerns at this time.

## 2019-09-15 NOTE — Patient Instructions (Addendum)
Follow up with your primary care provider if any symptoms persist, change or worsen at anytime.  Seek emergency medical care immediately if needed.  Keep liquid benadryl on hand can use 25mg  to 50mg  as needed per directions on adult benadryl package. Continue your Zyrtec.   Advised patient call the office or your primary care doctor for an appointment if no improvement within 72 hours or if any symptoms change or worsen at any time  Advised ER or urgent Care if after hours or on weekend. Call 911 for emergency symptoms at any time.Patinet verbalized understanding of all instructions given/reviewed and treatment plan and has no further questions or concerns at this time.      Prednisone tablets What is this medicine? PREDNISONE (PRED ni sone) is a corticosteroid. It is commonly used to treat inflammation of the skin, joints, lungs, and other organs. Common conditions treated include asthma, allergies, and arthritis. It is also used for other conditions, such as blood disorders and diseases of the adrenal glands. This medicine may be used for other purposes; ask your health care provider or pharmacist if you have questions. COMMON BRAND NAME(S): Deltasone, Predone, Sterapred, Sterapred DS What should I tell my health care provider before I take this medicine? They need to know if you have any of these conditions:  Cushing's syndrome  diabetes  glaucoma  heart disease  high blood pressure  infection (especially a virus infection such as chickenpox, cold sores, or herpes)  kidney disease  liver disease  mental illness  myasthenia gravis  osteoporosis  seizures  stomach or intestine problems  thyroid disease  an unusual or allergic reaction to lactose, prednisone, other medicines, foods, dyes, or preservatives  pregnant or trying to get pregnant  breast-feeding How should I use this medicine? Take this medicine by mouth with a glass of water. Follow the directions on  the prescription label. Take this medicine with food. If you are taking this medicine once a day, take it in the morning. Do not take more medicine than you are told to take. Do not suddenly stop taking your medicine because you may develop a severe reaction. Your doctor will tell you how much medicine to take. If your doctor wants you to stop the medicine, the dose may be slowly lowered over time to avoid any side effects. Talk to your pediatrician regarding the use of this medicine in children. Special care may be needed. Overdosage: If you think you have taken too much of this medicine contact a poison control center or emergency room at once. NOTE: This medicine is only for you. Do not share this medicine with others. What if I miss a dose? If you miss a dose, take it as soon as you can. If it is almost time for your next dose, talk to your doctor or health care professional. You may need to miss a dose or take an extra dose. Do not take double or extra doses without advice. What may interact with this medicine? Do not take this medicine with any of the following medications:  metyrapone  mifepristone This medicine may also interact with the following medications:  aminoglutethimide  amphotericin B  aspirin and aspirin-like medicines  barbiturates  certain medicines for diabetes, like glipizide or glyburide  cholestyramine  cholinesterase inhibitors  cyclosporine  digoxin  diuretics  ephedrine  male hormones, like estrogens and birth control pills  isoniazid  ketoconazole  NSAIDS, medicines for pain and inflammation, like ibuprofen or naproxen  phenytoin  rifampin  toxoids  vaccines  warfarin This list may not describe all possible interactions. Give your health care provider a list of all the medicines, herbs, non-prescription drugs, or dietary supplements you use. Also tell them if you smoke, drink alcohol, or use illegal drugs. Some items may interact  with your medicine. What should I watch for while using this medicine? Visit your doctor or health care professional for regular checks on your progress. If you are taking this medicine over a prolonged period, carry an identification card with your name and address, the type and dose of your medicine, and your doctor's name and address. This medicine may increase your risk of getting an infection. Tell your doctor or health care professional if you are around anyone with measles or chickenpox, or if you develop sores or blisters that do not heal properly. If you are going to have surgery, tell your doctor or health care professional that you have taken this medicine within the last twelve months. Ask your doctor or health care professional about your diet. You may need to lower the amount of salt you eat. This medicine may increase blood sugar. Ask your healthcare provider if changes in diet or medicines are needed if you have diabetes. What side effects may I notice from receiving this medicine? Side effects that you should report to your doctor or health care professional as soon as possible:  allergic reactions like skin rash, itching or hives, swelling of the face, lips, or tongue  changes in emotions or moods  changes in vision  depressed mood  eye pain  fever or chills, cough, sore throat, pain or difficulty passing urine  signs and symptoms of high blood sugar such as being more thirsty or hungry or having to urinate more than normal. You may also feel very tired or have blurry vision.  swelling of ankles, feet Side effects that usually do not require medical attention (report to your doctor or health care professional if they continue or are bothersome):  confusion, excitement, restlessness  headache  nausea, vomiting  skin problems, acne, thin and shiny skin  trouble sleeping  weight gain This list may not describe all possible side effects. Call your doctor for  medical advice about side effects. You may report side effects to FDA at 1-800-FDA-1088. Where should I keep my medicine? Keep out of the reach of children. Store at room temperature between 15 and 30 degrees C (59 and 86 degrees F). Protect from light. Keep container tightly closed. Throw away any unused medicine after the expiration date. NOTE: This sheet is a summary. It may not cover all possible information. If you have questions about this medicine, talk to your doctor, pharmacist, or health care provider.  2020 Elsevier/Gold Standard (2018-08-23 10:54:22)  Poison Ivy Dermatitis Poison ivy dermatitis is redness and soreness of the skin caused by chemicals in the leaves of the poison ivy plant. You may have very bad itching, swelling, a rash, and blisters. What are the causes?  Touching a poison ivy plant.  Touching something that has the chemical on it. This may include animals or objects that have come in contact with the plant. What increases the risk?  Going outdoors often in wooded or Mutual areas.  Going outdoors without wearing protective clothing, such as closed shoes, long pants, and a long-sleeved shirt. What are the signs or symptoms?   Skin redness.  Very bad itching.  A rash that often includes bumps and blisters. ? The rash  usually appears 48 hours after exposure, if you have been exposed before. ? If this is the first time you have been exposed, the rash may not appear until a week after exposure.  Swelling. This may occur if the reaction is very bad. Symptoms usually last for 1-2 weeks. The first time you develop this condition, symptoms may last 3-4 weeks. How is this treated? This condition may be treated with:  Hydrocortisone cream or calamine lotion to relieve itching.  Oatmeal baths to soothe the skin.  Medicines, such as over-the-counter antihistamine tablets.  Oral steroid medicine for more severe reactions. Follow these instructions at home:  Medicines  Take or apply over-the-counter and prescription medicines only as told by your doctor.  Use hydrocortisone cream or calamine lotion as needed to help with itching. General instructions  Do not scratch or rub your skin.  Put a cold, wet cloth (cold compress) on the affected areas or take baths in cool water. This will help with itching.  Avoid hot baths and showers.  Take oatmeal baths as needed. Use colloidal oatmeal. You can get this at a pharmacy or grocery store. Follow the instructions on the package.  While you have the rash, wash your clothes right after you wear them.  Keep all follow-up visits as told by your health care provider. This is important. How is this prevented?   Know what poison ivy looks like, so you can avoid it. ? This plant has three leaves with flowering branches on a single stem. ? The leaves are glossy. ? The leaves have uneven edges that come to a point at the front.  If you touch poison ivy, wash your skin with soap and water right away. Be sure to wash under your fingernails.  When hiking or camping, wear long pants, a long-sleeved shirt, tall socks, and hiking boots. You can also use a lotion on your skin that helps to prevent contact with poison ivy.  If you think that your clothes or outdoor gear came in contact with poison ivy, rinse them off with a garden hose before you bring them inside your house.  When doing yard work or gardening, wear gloves, long sleeves, long pants, and boots. Wash your garden tools and gloves if they come in contact with poison ivy.  If you think that your pet has come into contact with poison ivy, wash him or her with pet shampoo and water. Make sure to wear gloves while washing your pet. Contact a doctor if:  You have open sores in the rash area.  You have more redness, swelling, or pain in the rash area.  You have redness that spreads beyond the rash area.  You have fluid, blood, or pus coming from  the rash area.  You have a fever.  You have a rash over a large area of your body.  You have a rash on your eyes, mouth, or genitals.  Your rash does not get better after a few weeks. Get help right away if:  Your face swells or your eyes swell shut.  You have trouble breathing.  You have trouble swallowing. These symptoms may be an emergency. Do not wait to see if the symptoms will go away. Get medical help right away. Call your local emergency services (911 in the [place] Do not drive yourself to the hospital. Summary  Poison ivy dermatitis is redness and soreness of the skin caused by chemicals in the leaves of the poison ivy plant.  You may  have skin redness, very bad itching, swelling, and a rash.  Do not scratch or rub your skin.  Take or apply over-the-counter and prescription medicines only as told by your doctor. This information is not intended to replace advice given to you by your health care provider. Make sure you discuss any questions you have with your health care provider. Document Released: 12/26/2010 Document Revised: 03/17/2019 Document Reviewed: 11/18/2018 Elsevier Patient Education  2020 [employer]   Contact Dermatitis Dermatitis is redness, soreness, and swelling (inflammation) of the skin. Contact dermatitis is a reaction to something that touches the skin. There are two types of contact dermatitis:  Irritant contact dermatitis. This happens when something bothers (irritates) your skin, like soap.  Allergic contact dermatitis. This is caused when you are exposed to something that you are allergic to, such as poison ivy. What are the causes?  Common causes of irritant contact dermatitis include: ? Makeup. ? Soaps. ? Detergents. ? Bleaches. ? Acids. ? Metals, such as nickel.  Common causes of allergic contact dermatitis include: ? Plants. ? Chemicals. ? Jewelry. ? Latex. ? Medicines. ? Preservatives in products, such as clothing. What  increases the risk?  Having a job that exposes you to things that bother your skin.  Having asthma or eczema. What are the signs or symptoms? Symptoms may happen anywhere the irritant has touched your skin. Symptoms include:  Dry or flaky skin.  Redness.  Cracks.  Itching.  Pain or a burning feeling.  Blisters.  Blood or clear fluid draining from skin cracks. With allergic contact dermatitis, swelling may occur. This may happen in places such as the eyelids, mouth, or genitals. How is this treated?  This condition is treated by checking for the cause of the reaction and protecting your skin. Treatment may also include: ? Steroid creams, ointments, or medicines. ? Antibiotic medicines or other ointments, if you have a skin infection. ? Lotion or medicines to help with itching. ? A bandage (dressing). Follow these instructions at home: Skin care  Moisturize your skin as needed.  Put cool cloths on your skin.  Put a baking soda paste on your skin. Stir water into baking soda until it looks like a paste.  Do not scratch your skin.  Avoid having things rub up against your skin.  Avoid the use of soaps, perfumes, and dyes. Medicines  Take or apply over-the-counter and prescription medicines only as told by your doctor.  If you were prescribed an antibiotic medicine, take or apply it as told by your doctor. Do not stop using it even if your condition starts to get better. Bathing  Take a bath with: ? Epsom salts. ? Baking soda. ? Colloidal oatmeal.  Bathe less often.  Bathe in warm water. Avoid using hot water. Bandage care  If you were given a bandage, change it as told by your health care provider.  Wash your hands with soap and water before and after you change your bandage. If soap and water are not available, use hand sanitizer. General instructions  Avoid the things that caused your reaction. If you do not know what caused it, keep a journal. Write  down: ? What you eat. ? What skin products you use. ? What you drink. ? What you wear in the area that has symptoms. This includes jewelry.  Check the affected areas every day for signs of infection. Check for: ? More redness, swelling, or pain. ? More fluid or blood. ? Warmth. ? Pus or  a bad smell.  Keep all follow-up visits as told by your doctor. This is important. Contact a doctor if:  You do not get better with treatment.  Your condition gets worse.  You have signs of infection, such as: ? More swelling. ? Tenderness. ? More redness. ? Soreness. ? Warmth.  You have a fever.  You have new symptoms. Get help right away if:  You have a very bad headache.  You have neck pain.  Your neck is stiff.  You throw up (vomit).  You feel very sleepy.  You see red streaks coming from the area.  Your bone or joint near the area hurts after the skin has healed.  The area turns darker.  You have trouble breathing. Summary  Dermatitis is redness, soreness, and swelling of the skin.  Symptoms may occur where the irritant has touched you.  Treatment may include medicines and skin care.  If you do not know what caused your reaction, keep a journal.  Contact a doctor if your condition gets worse or you have signs of infection. This information is not intended to replace advice given to you by your health care provider. Make sure you discuss any questions you have with your health care provider. Document Released: 09/20/2009 Document Revised: 03/15/2019 Document Reviewed: 06/08/2018 Elsevier Patient Education  2020 [employer].  Diphenhydramine capsules or tablets What is this medicine? DIPHENHYDRAMINE (dye fen HYE dra meen) is an antihistamine. It is used to treat the symptoms of an allergic reaction. It is also used to treat Parkinson's disease. This medicine is also used to prevent and to treat motion sickness and as a nighttime sleep aid. This medicine may be  used for other purposes; ask your health care provider or pharmacist if you have questions. COMMON BRAND NAME(S): Alka-Seltzer Plus Allergy, Aller-G-Time, Banophen, Benadryl Allergy, Benadryl Allergy Dye Free, Benadryl Allergy Kapgel, Benadryl Allergy Ultratab, Diphedryl, Diphenhist, Genahist, Geri-Dryl, PHARBEDRYL, Q-Dryl, Veto KempsSleepinal, Valu-Dryl, Vicks ZzzQuil Nightime Sleep-Aid What should I tell my health care provider before I take this medicine? They need to know if you have any of these conditions:  asthma or lung disease  glaucoma  high blood pressure or heart disease  liver disease  pain or difficulty passing urine  prostate trouble  ulcers or other stomach problems  an unusual or allergic reaction to diphenhydramine, other medicines foods, dyes, or preservatives such as sulfites  pregnant or trying to get pregnant  breast-feeding How should I use this medicine? Take this medicine by mouth with a full glass of water. Follow the directions on the prescription label. Take your doses at regular intervals. Do not take your medicine more often than directed. To prevent motion sickness start taking this medicine 30 to 60 minutes before you leave. Talk to your pediatrician regarding the use of this medicine in children. Special care may be needed. Patients over 47 years old may have a stronger reaction and need a smaller dose. Overdosage: If you think you have taken too much of this medicine contact a poison control center or emergency room at once. NOTE: This medicine is only for you. Do not share this medicine with others. What if I miss a dose? If you miss a dose, take it as soon as you can. If it is almost time for your next dose, take only that dose. Do not take double or extra doses. What may interact with this medicine? Do not take this medicine with any of the following medications:  MAOIs like Carbex,  Eldepryl, Marplan, Nardil, and Parnate This medicine may also interact  with the following medications:  alcohol  barbiturates, like phenobarbital  medicines for bladder spasm like oxybutynin, tolterodine  medicines for blood pressure  medicines for depression, anxiety, or psychotic disturbances  medicines for movement abnormalities or Parkinson's disease  medicines for sleep  other medicines for cold, cough or allergy  some medicines for the stomach like chlordiazepoxide, dicyclomine This list may not describe all possible interactions. Give your health care provider a list of all the medicines, herbs, non-prescription drugs, or dietary supplements you use. Also tell them if you smoke, drink alcohol, or use illegal drugs. Some items may interact with your medicine. What should I watch for while using this medicine? Visit your doctor or health care professional for regular check ups. Tell your doctor if your symptoms do not improve or if they get worse. Your mouth may get dry. Chewing sugarless gum or sucking hard candy, and drinking plenty of water may help. Contact your doctor if the problem does not go away or is severe. This medicine may cause dry eyes and blurred vision. If you wear contact lenses you may feel some discomfort. Lubricating drops may help. See your eye doctor if the problem does not go away or is severe. You may get drowsy or dizzy. Do not drive, use machinery, or do anything that needs mental alertness until you know how this medicine affects you. Do not stand or sit up quickly, especially if you are an older patient. This reduces the risk of dizzy or fainting spells. Alcohol may interfere with the effect of this medicine. Avoid alcoholic drinks. What side effects may I notice from receiving this medicine? Side effects that you should report to your doctor or health care professional as soon as possible:  allergic reactions like skin rash, itching or hives, swelling of the face, lips, or tongue  changes in vision  confused, agitated,  nervous  irregular or fast heartbeat  tremor  trouble passing urine  unusual bleeding or bruising  unusually weak or tired Side effects that usually do not require medical attention (report to your doctor or health care professional if they continue or are bothersome):  constipation, diarrhea  drowsy  headache  loss of appetite  stomach upset, vomiting  thick mucous This list may not describe all possible side effects. Call your doctor for medical advice about side effects. You may report side effects to FDA at 1-800-FDA-1088. Where should I keep my medicine? Keep out of the reach of children. Store at room temperature between 15 and 30 degrees C (59 and 86 degrees F). Keep container closed tightly. Throw away any unused medicine after the expiration date. NOTE: This sheet is a summary. It may not cover all possible information. If you have questions about this medicine, talk to your doctor, pharmacist, or health care provider.  2020 Elsevier/Gold Standard (2008-03-12 17:06:22)

## 2019-10-02 DIAGNOSIS — R509 Fever, unspecified: Secondary | ICD-10-CM | POA: Diagnosis not present

## 2019-10-02 DIAGNOSIS — R519 Headache, unspecified: Secondary | ICD-10-CM | POA: Diagnosis not present

## 2019-10-02 DIAGNOSIS — R112 Nausea with vomiting, unspecified: Secondary | ICD-10-CM | POA: Diagnosis not present

## 2019-10-04 DIAGNOSIS — R509 Fever, unspecified: Secondary | ICD-10-CM | POA: Diagnosis not present

## 2019-10-04 DIAGNOSIS — R112 Nausea with vomiting, unspecified: Secondary | ICD-10-CM | POA: Diagnosis not present

## 2019-10-04 DIAGNOSIS — R519 Headache, unspecified: Secondary | ICD-10-CM | POA: Diagnosis not present

## 2019-10-16 ENCOUNTER — Ambulatory Visit: Payer: Self-pay | Admitting: Internal Medicine

## 2019-10-18 DIAGNOSIS — R29898 Other symptoms and signs involving the musculoskeletal system: Secondary | ICD-10-CM | POA: Diagnosis not present

## 2019-10-18 DIAGNOSIS — G43111 Migraine with aura, intractable, with status migrainosus: Secondary | ICD-10-CM | POA: Diagnosis not present

## 2019-10-18 DIAGNOSIS — R4189 Other symptoms and signs involving cognitive functions and awareness: Secondary | ICD-10-CM | POA: Diagnosis not present

## 2019-10-18 DIAGNOSIS — E538 Deficiency of other specified B group vitamins: Secondary | ICD-10-CM | POA: Diagnosis not present

## 2019-10-18 DIAGNOSIS — E519 Thiamine deficiency, unspecified: Secondary | ICD-10-CM | POA: Diagnosis not present

## 2019-10-18 DIAGNOSIS — M5481 Occipital neuralgia: Secondary | ICD-10-CM | POA: Diagnosis not present

## 2019-10-18 DIAGNOSIS — R200 Anesthesia of skin: Secondary | ICD-10-CM | POA: Diagnosis not present

## 2019-10-19 DIAGNOSIS — M5481 Occipital neuralgia: Secondary | ICD-10-CM | POA: Diagnosis not present

## 2019-10-19 DIAGNOSIS — M542 Cervicalgia: Secondary | ICD-10-CM | POA: Diagnosis not present

## 2019-10-25 ENCOUNTER — Other Ambulatory Visit: Payer: Self-pay | Admitting: Neurology

## 2019-10-25 DIAGNOSIS — R29898 Other symptoms and signs involving the musculoskeletal system: Secondary | ICD-10-CM

## 2019-10-25 DIAGNOSIS — G43111 Migraine with aura, intractable, with status migrainosus: Secondary | ICD-10-CM

## 2019-11-08 ENCOUNTER — Ambulatory Visit
Admission: RE | Admit: 2019-11-08 | Discharge: 2019-11-08 | Disposition: A | Discharge status: Home / Self Care | Payer: BC Managed Care – PPO | Source: Ambulatory Visit | Attending: Neurology | Admitting: Neurology

## 2019-11-08 ENCOUNTER — Other Ambulatory Visit: Payer: Self-pay

## 2019-11-08 DIAGNOSIS — G43909 Migraine, unspecified, not intractable, without status migrainosus: Secondary | ICD-10-CM | POA: Diagnosis not present

## 2019-11-08 DIAGNOSIS — G43111 Migraine with aura, intractable, with status migrainosus: Secondary | ICD-10-CM | POA: Insufficient documentation

## 2019-11-08 DIAGNOSIS — R29898 Other symptoms and signs involving the musculoskeletal system: Secondary | ICD-10-CM

## 2019-11-08 DIAGNOSIS — M50223 Other cervical disc displacement at C6-C7 level: Secondary | ICD-10-CM | POA: Diagnosis not present

## 2019-11-08 DIAGNOSIS — R519 Headache, unspecified: Secondary | ICD-10-CM | POA: Diagnosis not present

## 2019-11-08 MED ORDER — GADOBUTROL 1 MMOL/ML IV SOLN
10.0000 mL | Freq: Once | INTRAVENOUS | Status: AC | PRN
  Administered 2019-11-08: 15:00:00 10 mL via INTRAVENOUS

## 2019-11-09 ENCOUNTER — Encounter: Payer: Self-pay | Admitting: Internal Medicine

## 2019-11-09 ENCOUNTER — Ambulatory Visit (INDEPENDENT_AMBULATORY_CARE_PROVIDER_SITE_OTHER): Payer: BC Managed Care – PPO | Admitting: Internal Medicine

## 2019-11-09 DIAGNOSIS — G4733 Obstructive sleep apnea (adult) (pediatric): Secondary | ICD-10-CM

## 2019-11-09 DIAGNOSIS — J4520 Mild intermittent asthma, uncomplicated: Secondary | ICD-10-CM

## 2019-11-09 DIAGNOSIS — G43111 Migraine with aura, intractable, with status migrainosus: Secondary | ICD-10-CM | POA: Insufficient documentation

## 2019-11-09 NOTE — Patient Instructions (Signed)
MEDICATION ADJUSTMENTS/LABS AND TESTS ORDERED: Hold Symbicort Continue Spiriva assess respiratory status Albuterol as needed CPAP as tolerated    COVID-19 COVID-19 is a respiratory infection that is caused by a virus called severe acute respiratory syndrome coronavirus 2 (SARS-CoV-2). The disease is also known as coronavirus disease or novel coronavirus. In some people, the virus may not cause any symptoms. In others, it may cause a serious infection. The infection can get worse quickly and can lead to complications, such as:  Pneumonia, or infection of the lungs.  Acute respiratory distress syndrome or ARDS. This is fluid build-up in the lungs.  Acute respiratory failure. This is a condition in which there is not enough oxygen passing from the lungs to the body.  Sepsis or septic shock. This is a serious bodily reaction to an infection.  Blood clotting problems.  Secondary infections due to bacteria or fungus. The virus that causes COVID-19 is contagious. This means that it can spread from person to person through droplets from coughs and sneezes (respiratory secretions). What are the causes? This illness is caused by a virus. You may catch the virus by:  Breathing in droplets from an infected person's cough or sneeze.  Touching something, like a table or a doorknob, that was exposed to the virus (contaminated) and then touching your mouth, nose, or eyes. What increases the risk? Risk for infection You are more likely to be infected with this virus if you:  Live in or travel to an area with a COVID-19 outbreak.  Come in contact with a sick person who recently traveled to an area with a COVID-19 outbreak.  Provide care for or live with a person who is infected with COVID-19. Risk for serious illness You are more likely to become seriously ill from the virus if you:  Are 54 years of age or older.  Have a long-term disease that lowers your body's ability to fight infection  (immunocompromised).  Live in a nursing home or long-term care facility.  Have a long-term (chronic) disease such as: ? Chronic lung disease, including chronic obstructive pulmonary disease or asthma ? Heart disease. ? Diabetes. ? Chronic kidney disease. ? Liver disease.  Are obese. What are the signs or symptoms? Symptoms of this condition can range from mild to severe. Symptoms may appear any time from 2 to 14 days after being exposed to the virus. They include:  A fever.  A cough.  Difficulty breathing.  Chills.  Muscle pains.  A sore throat.  Loss of taste or smell. Some people may also have stomach problems, such as nausea, vomiting, or diarrhea. Other people may not have any symptoms of COVID-19. How is this diagnosed? This condition may be diagnosed based on:  Your signs and symptoms, especially if: ? You live in an area with a COVID-19 outbreak. ? You recently traveled to or from an area where the virus is common. ? You provide care for or live with a person who was diagnosed with COVID-19.  A physical exam.  Lab tests, which may include: ? A nasal swab to take a sample of fluid from your nose. ? A throat swab to take a sample of fluid from your throat. ? A sample of mucus from your lungs (sputum). ? Blood tests.  Imaging tests, which may include, X-rays, CT scan, or ultrasound. How is this treated? At present, there is no medicine to treat COVID-19. Medicines that treat other diseases are being used on a trial basis to see if  they are effective against COVID-19. Your health care provider will talk with you about ways to treat your symptoms. For most people, the infection is mild and can be managed at home with rest, fluids, and over-the-counter medicines. Treatment for a serious infection usually takes places in a hospital intensive care unit (ICU). It may include one or more of the following treatments. These treatments are given until your symptoms  improve.  Receiving fluids and medicines through an IV.  Supplemental oxygen. Extra oxygen is given through a tube in the nose, a face mask, or a hood.  Positioning you to lie on your stomach (prone position). This makes it easier for oxygen to get into the lungs.  Continuous positive airway pressure (CPAP) or bi-level positive airway pressure (BPAP) machine. This treatment uses mild air pressure to keep the airways open. A tube that is connected to a motor delivers oxygen to the body.  Ventilator. This treatment moves air into and out of the lungs by using a tube that is placed in your windpipe.  Tracheostomy. This is a procedure to create a hole in the neck so that a breathing tube can be inserted.  Extracorporeal membrane oxygenation (ECMO). This procedure gives the lungs a chance to recover by taking over the functions of the heart and lungs. It supplies oxygen to the body and removes carbon dioxide. Follow these instructions at home: Lifestyle  If you are sick, stay home except to get medical care. Your health care provider will tell you how long to stay home. Call your health care provider before you go for medical care.  Rest at home as told by your health care provider.  Do not use any products that contain nicotine or tobacco, such as cigarettes, e-cigarettes, and chewing tobacco. If you need help quitting, ask your health care provider.  Return to your normal activities as told by your health care provider. Ask your health care provider what activities are safe for you. General instructions  Take over-the-counter and prescription medicines only as told by your health care provider.  Drink enough fluid to keep your urine pale yellow.  Keep all follow-up visits as told by your health care provider. This is important. How is this prevented?  There is no vaccine to help prevent COVID-19 infection. However, there are steps you can take to protect yourself and others from this  virus. To protect yourself:   Do not travel to areas where COVID-19 is a risk. The areas where COVID-19 is reported change often. To identify high-risk areas and travel restrictions, check the CDC travel website: FatFares.com.br  If you live in, or must travel to, an area where COVID-19 is a risk, take precautions to avoid infection. ? Stay away from people who are sick. ? Wash your hands often with soap and water for 20 seconds. If soap and water are not available, use an alcohol-based hand sanitizer. ? Avoid touching your mouth, face, eyes, or nose. ? Avoid going out in public, follow guidance from your state and local health authorities. ? If you must go out in public, wear a cloth face covering or face mask. ? Disinfect objects and surfaces that are frequently touched every day. This may include:  Counters and tables.  Doorknobs and light switches.  Sinks and faucets.  Electronics, such as phones, remote controls, keyboards, computers, and tablets. To protect others: If you have symptoms of COVID-19, take steps to prevent the virus from spreading to others.  If you think you  have a COVID-19 infection, contact your health care provider right away. Tell your health care team that you think you may have a COVID-19 infection.  Stay home. Leave your house only to seek medical care. Do not use public transport.  Do not travel while you are sick.  Wash your hands often with soap and water for 20 seconds. If soap and water are not available, use alcohol-based hand sanitizer.  Stay away from other members of your household. Let healthy household members care for children and pets, if possible. If you have to care for children or pets, wash your hands often and wear a mask. If possible, stay in your own room, separate from others. Use a different bathroom.  Make sure that all people in your household wash their hands well and often.  Cough or sneeze into a tissue or your  sleeve or elbow. Do not cough or sneeze into your hand or into the air.  Wear a cloth face covering or face mask. Where to find more information  Centers for Disease Control and Prevention: PurpleGadgets.be  World Health Organization: https://www.castaneda.info/ Contact a health care provider if:  You live in or have traveled to an area where COVID-19 is a risk and you have symptoms of the infection.  You have had contact with someone who has COVID-19 and you have symptoms of the infection. Get help right away if:  You have trouble breathing.  You have pain or pressure in your chest.  You have confusion.  You have bluish lips and fingernails.  You have difficulty waking from sleep.  You have symptoms that get worse. These symptoms may represent a serious problem that is an emergency. Do not wait to see if the symptoms will go away. Get medical help right away. Call your local emergency services (911 in the [place] Do not drive yourself to the hospital. Let the emergency medical personnel know if you think you have COVID-19. Summary  COVID-19 is a respiratory infection that is caused by a virus. It is also known as coronavirus disease or novel coronavirus. It can cause serious infections, such as pneumonia, acute respiratory distress syndrome, acute respiratory failure, or sepsis.  The virus that causes COVID-19 is contagious. This means that it can spread from person to person through droplets from coughs and sneezes.  You are more likely to develop a serious illness if you are 57 years of age or older, have a weak immunity, live in a nursing home, or have chronic disease.  There is no medicine to treat COVID-19. Your health care provider will talk with you about ways to treat your symptoms.  Take steps to protect yourself and others from infection. Wash your hands often and disinfect objects and surfaces that are frequently touched every day.  Stay away from people who are sick and wear a mask if you are sick. This information is not intended to replace advice given to you by your health care provider. Make sure you discuss any questions you have with your health care provider. Document Released: 12/29/2018 Document Revised: 04/20/2019 Document Reviewed: 12/29/2018 Elsevier Patient Education  2020 [employer].

## 2019-11-09 NOTE — Progress Notes (Signed)
Name: Cory Espinoza MRN: [id number] DOB: [birth year]     I connected with the patient by telephone enabled telemedicine visit and verified that I am speaking with the correct person using two identifiers.    I discussed the limitations, risks, security and privacy concerns of performing an evaluation and management service by telemedicine and the availability of in-person appointments. I also discussed with the patient that there may be a patient responsible charge related to this service. The patient expressed understanding and agreed to proceed.  PATIENT AGREES AND CONFIRMS -YES   Other persons participating in the visit and their role in the encounter: Patient, nursing  This visit type was conducted due to national recommendations for restrictions regarding the COVID-19 Pandemic (e.g. social distancing).  This format is felt to be most appropriate for this patient at this time.  All issues noted in this document were discussed and addressed.       CONSULTATION DATE: 11/09/2019  REFERRING MD :  Uvaldo Rising  CHIEF COMPLAINT:  Follow up pneumonia and OSA  STUDIES:  CXR 11/18/17 No acute process No opacties   12/2017 EOS levels WNL  PFT 12/2017 Small airways disease FEF 25/75 reduced to 66% predicted Fev1/FVC ratio WNL FVC reduced TLC reduced DLCO NL when corrected for volumes Small airways obstruction with restrictive lung disease from increased weight   CT chest 3.29.19 persistent b/l infiltrates R>L BUT Much improved since last CT scan 12/2017  Patient is a long-term smoker smoked 2-1/2 packs/day for 20 years Patient is a former smoker quit 6 and and half years ago Patient is a Engineer, structural   CC Follow  Up OSA  HISTORY OF PRESENT ILLNESS: No acute respiratory issues at this time Patient currently weighs 214 pounds He has very mild intermittent cough and wheezing His exercise tolerance has improved  Previous history of pneumonia Only on Advair and Spiriva    Patient diagnosed with migraines  has not been using the CPAP machine No signs of infection No signs of distress No signs of heart failure   SOCIAL HISTORY:  reports that he quit smoking about 7 years ago. His smoking use included cigarettes. He started smoking about 30 years ago. He has a 46.00 pack-year smoking history. His smokeless tobacco use includes snuff. He reports current alcohol use. He reports that he does not use drugs.    Review of Systems:  Gen:  Denies  fever, sweats, chills weight loss  HEENT: Denies blurred vision, double vision, ear pain, eye pain, hearing loss, nose bleeds, sore throat Cardiac:  No dizziness, chest pain or heaviness, chest tightness,edema, No JVD Resp:   No cough, -sputum production, -shortness of breath,-wheezing, -hemoptysis,  Gi: Denies swallowing difficulty, stomach pain, nausea or vomiting, diarrhea, constipation, bowel incontinence Gu:  Denies bladder incontinence, burning urine Ext:   Denies Joint pain, stiffness or swelling Skin: Denies  skin rash, easy bruising or bleeding or hives Endoc:  Denies polyuria, polydipsia , polyphagia or weight change Psych:   Denies depression, insomnia or hallucinations  Other:  All other systems negative          ASSESSMENT / PLAN:  48 year old white male follow-up for 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 associated with allergic rhinitis in the setting of previous bilateral pneumonia resolved with time with underlying sleep apnea  Reactive airways disease small airways disease obstruction Recommend stopping Symbicort and continuing with Spiriva only to assess respiratory status Albuterol as needed No indication for antibiotics or steroids at this  time  Allergic rhinitis Continue Zyrtec 10 mg daily as needed  Obesity -recommend significant weight loss -recommend changing diet Patient has lost significant amount of weight  Deconditioned state -Recommend increased daily  activity and exercise  OSA Continue CPAP as prescribed  COVID-19 EDUCATION: The signs and symptoms of COVID-19 were discussed with the patient and how to seek care for testing.  The importance of social distancing was discussed today. Hand Washing Techniques and avoid touching face was advised.     MEDICATION ADJUSTMENTS/LABS AND TESTS ORDERED: Hold Symbicort Continue Spiriva assess respiratory status Albuterol as needed CPAP as tolerated   CURRENT MEDICATIONS REVIEWED AT LENGTH WITH PATIENT TODAY   Patient satisfied with Plan of action and management. All questions answered  Follow up in 1 year Total time spent 21 minutes   [name] [name], M.D.  [name]  Medical Director [facility] Medical Director [facility] Cardio-Pulmonary Department

## 2019-11-22 DIAGNOSIS — M5030 Other cervical disc degeneration, unspecified cervical region: Secondary | ICD-10-CM | POA: Diagnosis not present

## 2019-11-22 DIAGNOSIS — M5412 Radiculopathy, cervical region: Secondary | ICD-10-CM | POA: Diagnosis not present

## 2019-11-22 DIAGNOSIS — M5020 Other cervical disc displacement, unspecified cervical region: Secondary | ICD-10-CM | POA: Diagnosis not present

## 2019-11-22 DIAGNOSIS — M5441 Lumbago with sciatica, right side: Secondary | ICD-10-CM | POA: Diagnosis not present

## 2019-11-22 DIAGNOSIS — M545 Low back pain: Secondary | ICD-10-CM | POA: Diagnosis not present

## 2019-11-22 DIAGNOSIS — G8929 Other chronic pain: Secondary | ICD-10-CM | POA: Diagnosis not present

## 2019-11-23 ENCOUNTER — Telehealth: Payer: Self-pay | Admitting: Cardiovascular Disease

## 2019-11-23 NOTE — Telephone Encounter (Signed)
error 

## 2019-11-27 ENCOUNTER — Telehealth: Payer: Self-pay | Admitting: Cardiovascular Disease

## 2019-11-27 ENCOUNTER — Other Ambulatory Visit: Payer: Self-pay | Admitting: Physical Medicine and Rehabilitation

## 2019-11-27 DIAGNOSIS — M5416 Radiculopathy, lumbar region: Secondary | ICD-10-CM

## 2019-11-27 NOTE — Telephone Encounter (Signed)
   [facility] Pre-operative Risk Assessment    Request for surgical clearance:  1. What type of surgery is being performed?  R c7 - t1 interlaminar ESI x 2   2. When is this surgery scheduled? Not noted    3. What type of clearance is required (medical clearance vs. Pharmacy clearance to hold med vs. Both)? pharmacy  4. Are there any medications that need to be held prior to surgery and how long?Plavix x 5 days prior to each injection    5. Practice name and name of physician performing surgery? [facility]    6. What is your office phone number [phone]    7.   What is your office fax number [phone]   8.   Anesthesia type (None, local, MAC, general) ? Not noted    Clarisse Gouge 11/27/2019, 11:03 AM  _________________________________________________________________   (provider comments below)

## 2019-11-28 NOTE — Telephone Encounter (Signed)
Dr. Rockey Situ, this pt has hx of [name] by cath in 04/2016: 29 or 75%, downgraded after FFR, negative FFR of AV groove lesion in OM1 ostial lesion, Ost 2nd Mrg lesion, 60% stenosed, Mid Cx lesion, 50% stenosed. No intervention.  Can Plavix be held for 5 days for planned interlaminar injection.   Please route response back to P [facility] PREOP  Thanks

## 2019-12-05 NOTE — Telephone Encounter (Signed)
Ok to hold plavix 5 days Stay on asa No further testing needed before procedure

## 2019-12-06 ENCOUNTER — Telehealth: Payer: Self-pay | Admitting: Cardiovascular Disease

## 2019-12-06 ENCOUNTER — Other Ambulatory Visit: Payer: Self-pay | Admitting: Physical Medicine and Rehabilitation

## 2019-12-06 DIAGNOSIS — M5412 Radiculopathy, cervical region: Secondary | ICD-10-CM

## 2019-12-06 DIAGNOSIS — M5030 Other cervical disc degeneration, unspecified cervical region: Secondary | ICD-10-CM

## 2019-12-06 NOTE — Telephone Encounter (Signed)
   [facility] Pre-operative Risk Assessment    Request for surgical clearance:  1. What type of surgery is being performed? Cervical ESI  2. When is this surgery scheduled? TBD  3. What type of clearance is required (medical clearance vs. Pharmacy clearance to hold med vs. Both)?  pharmacy  4. Are there any medications that need to be held prior to surgery and how long? Plavisx for 5 days  5. Practice name and name of physician performing surgery? [facility]  6. What is your office phone number [phone]   7.   What is your office fax number [phone]  8.   Anesthesia type (None, local, MAC, general) ? Not noted   Marykay Lex 12/06/2019, 3:00 PM  _________________________________________________________________   (provider comments below)

## 2019-12-06 NOTE — Telephone Encounter (Signed)
   Primary Cardiologist: Dr Rockey Situ  Chart reviewed as part of pre-operative protocol coverage. Given past medical history and time since last visit, based on ACC/AHA guidelines, Cory Espinoza would be at acceptable risk for the planned procedure without further cardiovascular testing.   OK to hold Plavix 5 days pre op but Dr Rockey Situ suggests the patient stay on ASA.  I will route this recommendation to the requesting party via Epic fax function and remove from pre-op pool.  Please call with questions.  Kerin Ransom, PA-C 12/06/2019, 8:20 AM

## 2019-12-06 NOTE — Telephone Encounter (Signed)
   Primary Cardiologist: Dr Rockey Situ  Chart reviewed as part of pre-operative protocol coverage. Given past medical history and time since last visit, based on ACC/AHA guidelines, NAKAI POLLIO would be at acceptable risk for the planned procedure without further cardiovascular testing.   OK to hold Plavix 5 days pre op if needed.  I will route this recommendation to the requesting party via Epic fax function and remove from pre-op pool.  Please call with questions.  Kerin Ransom, PA-C 12/06/2019, 3:10 PM

## 2019-12-07 ENCOUNTER — Other Ambulatory Visit: Payer: Self-pay

## 2019-12-07 ENCOUNTER — Ambulatory Visit
Admission: RE | Admit: 2019-12-07 | Discharge: 2019-12-07 | Disposition: A | Discharge status: Home / Self Care | Payer: BC Managed Care – PPO | Source: Ambulatory Visit | Attending: Physical Medicine and Rehabilitation | Admitting: Physical Medicine and Rehabilitation

## 2019-12-07 DIAGNOSIS — M5416 Radiculopathy, lumbar region: Secondary | ICD-10-CM | POA: Insufficient documentation

## 2019-12-11 DIAGNOSIS — M4726 Other spondylosis with radiculopathy, lumbar region: Secondary | ICD-10-CM | POA: Insufficient documentation

## 2019-12-12 ENCOUNTER — Other Ambulatory Visit: Payer: Self-pay

## 2019-12-12 ENCOUNTER — Ambulatory Visit
Admission: RE | Admit: 2019-12-12 | Discharge: 2019-12-12 | Disposition: A | Discharge status: Home / Self Care | Payer: BC Managed Care – PPO | Source: Ambulatory Visit | Attending: Physical Medicine and Rehabilitation | Admitting: Physical Medicine and Rehabilitation

## 2019-12-12 DIAGNOSIS — M5412 Radiculopathy, cervical region: Secondary | ICD-10-CM

## 2019-12-12 DIAGNOSIS — M5030 Other cervical disc degeneration, unspecified cervical region: Secondary | ICD-10-CM

## 2019-12-12 MED ORDER — IOPAMIDOL (ISOVUE-M 200) INJECTION 41%
1.0000 mL | Freq: Once | INTRAMUSCULAR | Status: AC
  Administered 2019-12-12: 1 mL via EPIDURAL

## 2019-12-12 MED ORDER — METHYLPREDNISOLONE ACETATE 40 MG/ML INJ SUSP (RADIOLOG
120.0000 mg | Freq: Once | INTRAMUSCULAR | Status: AC
  Administered 2019-12-12: 13:00:00 120 mg via EPIDURAL

## 2019-12-12 NOTE — Discharge Instructions (Signed)

## 2019-12-18 ENCOUNTER — Emergency Department
Admission: EM | Admit: 2019-12-18 | Discharge: 2019-12-18 | Disposition: A | Discharge status: Home / Self Care | Payer: BC Managed Care – PPO | Attending: Emergency Medicine | Admitting: Emergency Medicine

## 2019-12-18 ENCOUNTER — Other Ambulatory Visit: Payer: Self-pay

## 2019-12-18 ENCOUNTER — Emergency Department: Payer: BC Managed Care – PPO

## 2019-12-18 DIAGNOSIS — Z79899 Other long term (current) drug therapy: Secondary | ICD-10-CM | POA: Diagnosis not present

## 2019-12-18 DIAGNOSIS — Z7901 Long term (current) use of anticoagulants: Secondary | ICD-10-CM | POA: Diagnosis not present

## 2019-12-18 DIAGNOSIS — R413 Other amnesia: Secondary | ICD-10-CM | POA: Diagnosis not present

## 2019-12-18 DIAGNOSIS — I10 Essential (primary) hypertension: Secondary | ICD-10-CM | POA: Insufficient documentation

## 2019-12-18 DIAGNOSIS — Z20822 Contact with and (suspected) exposure to covid-19: Secondary | ICD-10-CM | POA: Diagnosis not present

## 2019-12-18 DIAGNOSIS — I25118 Atherosclerotic heart disease of native coronary artery with other forms of angina pectoris: Secondary | ICD-10-CM | POA: Diagnosis not present

## 2019-12-18 DIAGNOSIS — F17290 Nicotine dependence, other tobacco product, uncomplicated: Secondary | ICD-10-CM | POA: Insufficient documentation

## 2019-12-18 DIAGNOSIS — R4182 Altered mental status, unspecified: Secondary | ICD-10-CM | POA: Diagnosis present

## 2019-12-18 DIAGNOSIS — J449 Chronic obstructive pulmonary disease, unspecified: Secondary | ICD-10-CM | POA: Insufficient documentation

## 2019-12-18 DIAGNOSIS — R410 Disorientation, unspecified: Secondary | ICD-10-CM | POA: Insufficient documentation

## 2019-12-18 LAB — URINALYSIS, COMPLETE (UACMP) WITH MICROSCOPIC
Bacteria, UA: NONE SEEN
Bilirubin Urine: NEGATIVE
Glucose, UA: NEGATIVE mg/dL
Hgb urine dipstick: NEGATIVE
Ketones, ur: NEGATIVE mg/dL
Leukocytes,Ua: NEGATIVE
Nitrite: NEGATIVE
Protein, ur: NEGATIVE mg/dL
Specific Gravity, Urine: 1.036 — ABNORMAL HIGH (ref 1.005–1.030)
Squamous Epithelial / LPF: NONE SEEN (ref 0–5)
pH: 5 (ref 5.0–8.0)

## 2019-12-18 LAB — CBC
HCT: 50.1 % (ref 39.0–52.0)
Hemoglobin: 17.4 g/dL — ABNORMAL HIGH (ref 13.0–17.0)
MCH: 32.5 pg (ref 26.0–34.0)
MCHC: 34.7 g/dL (ref 30.0–36.0)
MCV: 93.5 fL (ref 80.0–100.0)
Platelets: 247 10*3/uL (ref 150–400)
RBC: 5.36 MIL/uL (ref 4.22–5.81)
RDW: 14 % (ref 11.5–15.5)
WBC: 9.5 10*3/uL (ref 4.0–10.5)
nRBC: 0 % (ref 0.0–0.2)

## 2019-12-18 LAB — BASIC METABOLIC PANEL
Anion gap: 9 (ref 5–15)
BUN: 26 mg/dL — ABNORMAL HIGH (ref 6–20)
CO2: 21 mmol/L — ABNORMAL LOW (ref 22–32)
Calcium: 9.1 mg/dL (ref 8.9–10.3)
Chloride: 108 mmol/L (ref 98–111)
Creatinine, Ser: 1.1 mg/dL (ref 0.61–1.24)
GFR calc Af Amer: >60 mL/min (ref >60)
GFR calc non Af Amer: >60 mL/min (ref >60)
Glucose, Bld: 99 mg/dL (ref 70–99)
Potassium: 3.9 mmol/L (ref 3.5–5.1)
Sodium: 138 mmol/L (ref 135–145)

## 2019-12-18 LAB — POC SARS CORONAVIRUS 2 AG: SARS Coronavirus 2 Ag: NEGATIVE

## 2019-12-18 MED ORDER — GADOBUTROL 1 MMOL/ML IV SOLN
9.0000 mL | Freq: Once | INTRAVENOUS | Status: AC | PRN
  Administered 2019-12-18: 9 mL via INTRAVENOUS
  Filled 2019-12-18: qty 10

## 2019-12-18 NOTE — ED Provider Notes (Signed)
[facility] Emergency Department Provider Note   ____________________________________________    I have reviewed the triage vital signs and the nursing notes.   HISTORY  Chief Complaint Altered Mental Status     HPI Cory Espinoza is a 49 y.o. male with a history as noted below who was sent in by his neurologist for MRI of the brain because of confusion.  Wife reports the patient has had significant confusion ever since a fall in November, she reports it seems worse the last several days.  They communicated this to Dr. Sherryll Burger who recommended he come in for an MRI of the brain.  Patient thinks it is November 23 however he is oriented to the day of the week and the president.  Denies nausea or vomiting.  Does have frequent headaches over the last several months that Dr. Sherryll Burger has been evaluating.  No fevers or chills.  No focal weakness at this time  Past Medical History:  Diagnosis Date  . Allergy   . Anxiety   . COPD (chronic obstructive pulmonary disease) (HCC)   . Coronary artery disease   . Hyperlipidemia   . Hypertension     Patient Active Problem List   Diagnosis Date Noted  . Smoker 08/27/2018  . Benign essential HTN 08/27/2018  . Cough productive of purulent sputum 11/08/2017  . GERD (gastroesophageal reflux disease) 05/06/2017  . Contusion of hand 04/22/2017  . BP (high blood pressure) 04/22/2017  . Oxygen desaturation 04/22/2017  . Prostate lump 04/22/2017  . Unstable angina (HCC) 04/27/2016  . Chest pain 04/24/2016  . Morbid obesity (HCC) 01/29/2016  . Elevated ALT measurement 10/30/2015  . Nonspecific elevation of levels of transaminase and lactic acid dehydrogenase (ldh) 10/30/2015  . Reduced libido 10/29/2015  . Hyperlipidemia 08/06/2015  . Anxiety 08/06/2015  . Atherosclerosis of coronary artery 08/06/2015  . Childhood asthma 08/06/2015  . Chronic obstructive pulmonary disease (HCC) 08/06/2015  . OSA (obstructive sleep  apnea) 08/06/2015  . Anxiety disorder 08/06/2015  . Atherosclerosis of native coronary artery of native heart with stable angina pectoris (HCC) 08/06/2015  . Uncomplicated asthma 08/06/2015    Past Surgical History:  Procedure Laterality Date  . BACK SURGERY  1996  . CARDIAC CATHETERIZATION Left 04/27/2016   Procedure: Left Heart Cath and Coronary Angiography;  Surgeon: Lamar Blinks, MD;  Location: [facility] INVASIVE CV LAB;  Service: Cardiovascular;  Laterality: Left;  . CARDIAC CATHETERIZATION N/A 04/27/2016   Procedure: Intravascular Pressure Wire/FFR Study;  Surgeon: Alwyn Pea, MD;  Location: [facility] INVASIVE CV LAB;  Service: Cardiovascular;  Laterality: N/A;  . CATHETERIZATION OF PULMONARY ARTERY WITH RETRIEVAL OF FOREIGN BODY Bilateral 04/07/2011   heart  . KNEE ARTHROSCOPY Bilateral 2004  . LUMBAR DISC SURGERY  1999    Prior to Admission medications   Medication Sig Start Date End Date Taking? Authorizing Provider  albuterol (PROVENTIL HFA;VENTOLIN HFA) 108 (90 Base) MCG/ACT inhaler Inhale 1-2 puffs into the lungs every 4 (four) hours as needed for wheezing or shortness of breath. 06/03/18   Erin Fulling, MD  atorvastatin (LIPITOR) 40 MG tablet Take 1 tablet (40 mg total) by mouth at bedtime. 02/04/18   Kerman Passey, MD  B COMPLEX VITAMINS PO Take 1 tablet by mouth daily.     [provider]  cetirizine (ZYRTEC) 10 MG tablet TAKE 1 TABLET(10 MG) BY MOUTH DAILY 04/24/19   Erin Fulling, MD  clopidogrel (PLAVIX) 75 MG tablet Take 1 tablet (75 mg total)  by mouth daily. 02/04/18   Lada, Janit Bern, MD  escitalopram (LEXAPRO) 20 MG tablet Take 20 mg by mouth daily.    [provider]  ipratropium-albuterol (DUONEB) 0.5-2.5 (3) MG/3ML SOLN Take 3 mLs by nebulization every 4 (four) hours as needed. DX: J44.9 COPD 11/25/17   Erin Fulling, MD  Multiple Vitamins-Minerals (MULTIVITAMIN ADULT PO) Take 1 tablet by mouth daily.    [provider]  nitroGLYCERIN  (NITROSTAT) 0.4 MG SL tablet Place 0.4 mg under the tongue every 5 (five) minutes as needed for chest pain.    [provider]  pantoprazole (PROTONIX) 40 MG tablet TAKE 1 TABLET(40 MG) BY MOUTH DAILY 01/27/19   Erin Fulling, MD  POTASSIUM PO Take 1 tablet by mouth daily.    [provider]  predniSONE (STERAPRED UNI-PAK 48 TAB) 10 MG (48) TBPK tablet Take PO 60 mg day 1 &2, 50 mg on day 3& 4, 40 mg day 5 & 6 , 30 mg day 7& 8 ,20mg  day 9&10, 10 mg day 11&12 09/15/19   Flinchum, 11/15/19, FNP  Saw Palmetto 160 MG CAPS Take 1 capsule by mouth daily.     [provider]  Tiotropium Bromide Monohydrate (SPIRIVA RESPIMAT) 1.25 MCG/ACT AERS Inhale 2 puffs into the lungs 2 (two) times daily. 06/03/18   06/05/18, MD     Allergies Amoxicillin, Aspirin, Codeine, Hydrocodone-acetaminophen, Oxycodone-acetaminophen, and Penicillins  Family History  Problem Relation Age of Onset  . Heart disease Mother   . Heart disease Father   . Heart attack Father 33  . Diabetes Sister   . Hypertension Sister   . Hyperlipidemia Sister   . Hypertension Brother   . Heart attack Brother 45  . Cancer Paternal Grandmother        breast  . Diabetes Paternal Grandmother   . Hyperlipidemia Brother   . Supraventricular tachycardia Brother   . Heart attack Paternal Grandfather 30    Social History Social History   Tobacco Use  . Smoking status: Former Smoker    Packs/day: 2.00    Years: 23.00    Pack years: 46.00    Types: Cigarettes    Start date: 10/28/1989    Quit date: 10/28/2012    Years since quitting: 7.1  . Smokeless tobacco: Current User    Types: Snuff  Substance Use Topics  . Alcohol use: Yes    Alcohol/week: 0.0 standard drinks    Comment: rarely  . Drug use: No    Review of Systems  Constitutional: No fever/chills Eyes: No visual changes.  ENT: Chronic neck Cardiovascular: Denies chest pain. Respiratory: Denies shortness of breath. Gastrointestinal: No  abdominal pain.    Genitourinary: Negative for dysuria. Musculoskeletal: Negative for back pain. Skin: Negative for rash. Neurological: As above   ____________________________________________   PHYSICAL EXAM:  VITAL SIGNS: ED Triage Vitals  Enc Vitals Group     BP 12/18/19 1557 (!) 145/86     Pulse Rate 12/18/19 1557 100     Resp 12/18/19 1557 18     Temp 12/18/19 1557 98.2 F (36.8 C)     Temp Source 12/18/19 1557 Oral     SpO2 12/18/19 1557 100 %     Weight 12/18/19 1558 98.4 kg (217 lb)     Height 12/18/19 1558 1.829 m (6')     Head Circumference --      Peak Flow --      Pain Score 12/18/19 1558 10     Pain  Loc --      Pain Edu? --      Excl. in [place]? --     Constitutional: Alert and oriented.  Head: Atraumatic.  Mouth/Throat: Mucous membranes are moist.   Cardiovascular: Normal rate, regular rhythm.   Good peripheral circulation. Respiratory: Normal respiratory effort.  No retractions.   Genitourinary: deferred Musculoskeletal:  Warm and well perfused Neurologic:  Normal speech and language. No gross focal neurologic deficits are appreciated.  Skin:  Skin is warm, dry and intact. No rash noted. Psychiatric: Mood and affect are normal. Speech and behavior are normal.  ____________________________________________   LABS (all labs ordered are listed, but only abnormal results are displayed)  Labs Reviewed  BASIC METABOLIC PANEL - Abnormal; Notable for the following components:      Result Value   CO2 21 (*)    BUN 26 (*)    All other components within normal limits  CBC - Abnormal; Notable for the following components:   Hemoglobin 17.4 (*)    All other components within normal limits  URINALYSIS, COMPLETE (UACMP) WITH MICROSCOPIC - Abnormal; Notable for the following components:   Color, Urine YELLOW (*)    APPearance CLOUDY (*)    Specific Gravity, Urine 1.036 (*)    All other components within normal limits  POC SARS CORONAVIRUS 2 AG    ____________________________________________  EKG  None ____________________________________________  RADIOLOGY  MRI of the brain is unremarkable ____________________________________________   PROCEDURES  Procedure(s) performed: No  Procedures   Critical Care performed: No ____________________________________________   INITIAL IMPRESSION / ASSESSMENT AND PLAN / ED COURSE  Pertinent labs & imaging results that were available during my care of the patient were reviewed by me and considered in my medical decision making (see chart for details).  Patient with confusion for several months now, possibly worsening.  Referred in for MRI.  MRI completed and is unremarkable.  Lab work today is unremarkable.  Given time course no indication for admission at this time, patient will follow up with his neurologist    ____________________________________________   FINAL CLINICAL IMPRESSION(S) / ED DIAGNOSES  Final diagnoses:  Confusion  Memory difficulties        Note:  This document was prepared using Dragon voice recognition software and may include unintentional dictation errors.   Lavonia Drafts, MD 12/18/19 1907

## 2019-12-18 NOTE — ED Notes (Signed)
See triage note  Presents with family  Per family he has had increased migraines    Has been treated by Dr Sherryll Burger    Has had some injections,MRI's and new medication  Family state now he is having some memory problems

## 2019-12-18 NOTE — ED Triage Notes (Addendum)
Pt states he fell in [place] while doing [place] training in september and is having trouble migraines pt is confused at present, thinks it is November. Pt is tearful and upset in triage. States he has migraines and gets more confused with it. Wife is with the pt on arrival. States the confusion was worse on Saturday and Dr. Sherryll Burger advised to come to the Ed for a MRI with/without contrast

## 2019-12-18 NOTE — ED Notes (Signed)
Patient transported to MRI 

## 2019-12-22 DIAGNOSIS — R29810 Facial weakness: Secondary | ICD-10-CM | POA: Insufficient documentation

## 2020-01-04 ENCOUNTER — Telehealth: Payer: Self-pay | Admitting: Cardiovascular Disease

## 2020-01-04 NOTE — Telephone Encounter (Signed)
   [facility] Pre-operative Risk Assessment    Request for surgical clearance:  1. What type of surgery is being performed? C6-7 ANTERIOR CERVICAL FUSION  2. When is this surgery scheduled? TBD  3. What type of clearance is required (medical clearance vs. Pharmacy clearance to hold med vs. Both)?   Are there any medications that need to be held prior to surgery and how long? NOT LISTED  Practice name and name of physician performing surgery? [facility]  4. What is your office phone number [phone]   7.   What is your office fax number [phone]  8.   Anesthesia type (None, local, MAC, general) ? GENERAL   Cory Espinoza 01/04/2020, 9:01 AM  _________________________________________________________________   (provider comments below)

## 2020-01-04 NOTE — Telephone Encounter (Signed)
   Primary Cardiologist: Julien Nordmann, MD  Chart reviewed as part of pre-operative protocol coverage. Given past medical history and time since last visit, based on ACC/AHA guidelines, Cory Espinoza would be at acceptable risk for the planned procedure without further cardiovascular testing.   He was cleared for procedure on 12/06/2019 and was to hold Plavix for 5 days prior to procedure, start again ASAP after procedure was completed. This was reviewed by Dr. Mariah Milling.   I will route this recommendation to the requesting party via Epic fax function and remove from pre-op pool.  Please call with questions.  Bettey Mare. Liborio Nixon, ANP, AACC  01/04/2020, 9:19 AM

## 2020-01-09 ENCOUNTER — Telehealth: Payer: Self-pay | Admitting: Internal Medicine

## 2020-01-09 ENCOUNTER — Telehealth: Payer: Self-pay

## 2020-01-09 ENCOUNTER — Encounter: Payer: Self-pay | Admitting: Cardiovascular Disease

## 2020-01-09 DIAGNOSIS — J449 Chronic obstructive pulmonary disease, unspecified: Secondary | ICD-10-CM

## 2020-01-09 NOTE — Telephone Encounter (Signed)
Called pts wife back and informed her that DK is out of the office until next week. She mentioned that her husband is waiting for pulmonary clearance to get surgery.  We will put him on for 01/10/2020 at 4pm with NP Buelah Manis. Also advised him to get an CXR prior to appointment at 330pm. Pts wife verbalized understanding. Nothing further needed.

## 2020-01-09 NOTE — Telephone Encounter (Signed)
This encounter was created in error - please disregard.

## 2020-01-09 NOTE — Telephone Encounter (Signed)
Created in error

## 2020-01-09 NOTE — Telephone Encounter (Signed)
Returned pt's wife call  but I informed her that she can all the requesting surgeon's to get more information seeing as there was no DPR on file to speak with her about the matter. Pt's verbalized understanding and thanked me for returning her phone call.

## 2020-01-09 NOTE — Telephone Encounter (Signed)
Wife is calling to get an update on the status of patient clearance

## 2020-01-09 NOTE — Telephone Encounter (Signed)
The telephone number for Dr. Ethelene Browns office is [phone] ext. [phone]

## 2020-01-10 ENCOUNTER — Encounter: Payer: Self-pay | Admitting: Primary Care

## 2020-01-10 ENCOUNTER — Other Ambulatory Visit: Payer: Self-pay

## 2020-01-10 ENCOUNTER — Ambulatory Visit
Admission: RE | Admit: 2020-01-10 | Discharge: 2020-01-10 | Disposition: A | Discharge status: Home / Self Care | Payer: BC Managed Care – PPO | Source: Ambulatory Visit | Attending: Pulmonary Disease | Admitting: Pulmonary Disease

## 2020-01-10 ENCOUNTER — Ambulatory Visit: Payer: BC Managed Care – PPO | Admitting: Primary Care

## 2020-01-10 DIAGNOSIS — J449 Chronic obstructive pulmonary disease, unspecified: Secondary | ICD-10-CM | POA: Diagnosis present

## 2020-01-10 DIAGNOSIS — G4733 Obstructive sleep apnea (adult) (pediatric): Secondary | ICD-10-CM

## 2020-01-10 DIAGNOSIS — Z01811 Encounter for preprocedural respiratory examination: Secondary | ICD-10-CM

## 2020-01-10 NOTE — Patient Instructions (Addendum)
Cleared from pulmonary standpoint for neurosurgery. You have a moderate risk for prolonged mechanical ventilation and post-op complications d.t hx sleep apnea and restrictive lung disease  Take inhalers day of surgery and bring with you  Let anesthesiology know that you have sleep apnea  Hold Plavix for 3 days prior to surgery  Recommend 1. Short duration of surgery as much as possible and avoid paralytic if possible 2. Recovery in step down or ICU with Pulmonary consultation if needed 3. DVT prophylaxis 4. Aggressive pulmonary toilet with O2, bronchodilatation, and incentive spirometry and early ambulation

## 2020-01-10 NOTE — Progress Notes (Signed)
@Patient  ID: , male    DOB: 1971-12-04, 49 y.o.   MRN: [id number]  Chief Complaint  Patient presents with  . Follow-up    Pt denies any SOB, wheezing, fever or chills. Occ cough due to dogs.  . Pre-op Exam    For ruptured disc    Referring provider: [id number], MD  HPI: 49 year old male, former smoker (46 pack year hx). PMH significant for COPD/small airway disease, OSA, HTN, atherosclerosis, GERD, anxiety, migrains. Patient of Dr. 57, last seen on 11/09/19. No using CPAP d/t migraines. Symbicort discontinued, continue Spiriva Respimat 1.80mcg and prn albutero hfa/duoneb.   01/10/2020 Patient presents today for pre-op clearance for C6-7 cervical fusion with Dr. 03/09/2020, date to be determined. Accompanied by his wife. He is doing well. Breathing is baseline, no recent respiratory infections. Continue Spiriva respimat 1.61mcg as prescribed. Rarely requires Albuterol rescue inhaler. He has not been able to use CPAP since September 2020 d/t dx aura migraines.    PFT  12/2017- Small airways obstruction with restrictive lung disease from increased weight FEF 25/75 reduced to 66% predicted Fev1/FVC ratio WNL FVC reduced TLC reduced DLCO NL when corrected for volumes  Imaging: CT chest 3.29.19- persistent b/l infiltrates R>L BUT Much improved since last CT scan 1/201  Allergies  Allergen Reactions  . Amoxicillin Hives and Itching    Has patient had a PCN reaction causing immediate rash, facial/tongue/throat swelling, SOB or lightheadedness with hypotension: No Has patient had a PCN reaction causing severe rash involving mucus membranes or skin necrosis: No Has patient had a PCN reaction that required hospitalization: No Has patient had a PCN reaction occurring within the last 10 years: No If all of the above answers are "NO", then may proceed with Cephalosporin use.   . Aspirin Other (See Comments)    swelling of throat swelling of throat  . Codeine Hives  .  Hydrocodone-Acetaminophen Other (See Comments)    itching itching  . Oxycodone-Acetaminophen Nausea And Vomiting  . Penicillins Hives and Itching    Has patient had a PCN reaction causing immediate rash, facial/tongue/throat swelling, SOB or lightheadedness with hypotension: No Has patient had a PCN reaction causing severe rash involving mucus membranes or skin necrosis: No Has patient had a PCN reaction that required hospitalization: No Has patient had a PCN reaction occurring within the last 10 years: No If all of the above answers are "NO", then may proceed with Cephalosporin use.     Immunization History  Administered Date(s) Administered  . Influenza, Seasonal, Injecte, Preservative Fre 10/07/2013  . Influenza,inj,Quad PF,6+ Mos 10/19/2014  . Influenza-Unspecified 08/28/2015, 09/24/2018, 08/31/2019    Past Medical History:  Diagnosis Date  . Allergy   . Anxiety   . COPD (chronic obstructive pulmonary disease) (HCC)   . Coronary artery disease   . Hyperlipidemia   . Hypertension     Tobacco History: Social History   Tobacco Use  Smoking Status Former Smoker  . Packs/day: 2.00  . Years: 23.00  . Pack years: 46.00  . Types: Cigarettes  . Start date: 10/28/1989  . Quit date: 10/28/2012  . Years since quitting: 7.2  Smokeless Tobacco Current User  . Types: Snuff   Ready to quit: Not Answered Counseling given: Not Answered   Outpatient Medications Prior to Visit  Medication Sig Dispense Refill  . albuterol (PROVENTIL HFA;VENTOLIN HFA) 108 (90 Base) MCG/ACT inhaler Inhale 1-2 puffs into the lungs every 4 (four) hours as needed for wheezing or  shortness of breath. 1 Inhaler 5  . atorvastatin (LIPITOR) 40 MG tablet Take 1 tablet (40 mg total) by mouth at bedtime. 90 tablet 1  . B COMPLEX VITAMINS PO Take 1 tablet by mouth daily.     . cetirizine (ZYRTEC) 10 MG tablet TAKE 1 TABLET(10 MG) BY MOUTH DAILY 90 tablet 1  . clopidogrel (PLAVIX) 75 MG tablet Take 1 tablet  (75 mg total) by mouth daily. 90 tablet 1  . escitalopram (LEXAPRO) 20 MG tablet Take 20 mg by mouth daily.    Marland Kitchen ipratropium-albuterol (DUONEB) 0.5-2.5 (3) MG/3ML SOLN Take 3 mLs by nebulization every 4 (four) hours as needed. DX: J44.9 COPD 360 mL 3  . Multiple Vitamins-Minerals (MULTIVITAMIN ADULT PO) Take 1 tablet by mouth daily.    . nitroGLYCERIN (NITROSTAT) 0.4 MG SL tablet Place 0.4 mg under the tongue every 5 (five) minutes as needed for chest pain.    . pantoprazole (PROTONIX) 40 MG tablet TAKE 1 TABLET(40 MG) BY MOUTH DAILY 30 tablet 10  . POTASSIUM PO Take 1 tablet by mouth daily.    . Saw Palmetto 160 MG CAPS Take 1 capsule by mouth daily.     . Tiotropium Bromide Monohydrate (SPIRIVA RESPIMAT) 1.25 MCG/ACT AERS Inhale 2 puffs into the lungs 2 (two) times daily. 1 Inhaler 5  . predniSONE (STERAPRED UNI-PAK 48 TAB) 10 MG (48) TBPK tablet Take PO 60 mg day 1 &2, 50 mg on day 3& 4, 40 mg day 5 & 6 , 30 mg day 7& 8 ,20mg  day 9&10, 10 mg day 11&12 42 tablet 0   No facility-administered medications prior to visit.   Review of Systems  Review of Systems  Respiratory: Negative for cough, chest tightness, shortness of breath and wheezing.   Cardiovascular: Negative.   Musculoskeletal: Positive for neck pain.  Neurological: Positive for headaches.   Physical Exam  BP 106/78 (BP Location: Left Arm, Patient Position: Sitting, Cuff Size: Large)   Pulse 70   Temp 98.1 F (36.7 C) (Temporal)   Ht 6' (1.829 m)   Wt 223 lb 9.6 oz (101.4 kg)   SpO2 98% Comment: on ra  BMI 30.33 kg/m  Physical Exam Constitutional:      General: He is not in acute distress.    Appearance: Normal appearance. He is not ill-appearing.  HENT:     Mouth/Throat:     Comments: Deferred d/t masking Cardiovascular:     Rate and Rhythm: Normal rate.  Pulmonary:     Effort: Pulmonary effort is normal. No respiratory distress.     Breath sounds: No stridor. No wheezing.  Skin:    General: Skin is warm and  dry.  Neurological:     General: No focal deficit present.     Mental Status: He is alert and oriented to person, place, and time. Mental status is at baseline.     Comments: Migraine headache/light sensitivity  Psychiatric:        Mood and Affect: Mood normal.        Behavior: Behavior normal.        Thought Content: Thought content normal.        Judgment: Judgment normal.     Lab Results:  CBC    Component Value Date/Time   WBC 9.5 12/18/2019 1615   RBC 5.36 12/18/2019 1615   HGB 17.4 (H) 12/18/2019 1615   HGB 16.1 09/15/2019 1200   HCT 50.1 12/18/2019 1615   HCT 48.5 09/15/2019 1200   PLT  247 12/18/2019 1615   PLT 200 09/15/2019 1200   MCV 93.5 12/18/2019 1615   MCV 97 09/15/2019 1200   MCH 32.5 12/18/2019 1615   MCHC 34.7 12/18/2019 1615   RDW 14.0 12/18/2019 1615   RDW 12.5 09/15/2019 1200   LYMPHSABS 2.2 09/15/2019 1200   MONOABS 0.5 12/21/2017 1128   EOSABS 0.3 09/15/2019 1200   BASOSABS 0.0 09/15/2019 1200    BMET    Component Value Date/Time   NA 138 12/18/2019 1615   NA 139 09/15/2019 1200   K 3.9 12/18/2019 1615   CL 108 12/18/2019 1615   CO2 21 (L) 12/18/2019 1615   GLUCOSE 99 12/18/2019 1615   BUN 26 (H) 12/18/2019 1615   BUN 14 09/15/2019 1200   CREATININE 1.10 12/18/2019 1615   CALCIUM 9.1 12/18/2019 1615   GFRNONAA >60 12/18/2019 1615   GFRAA >60 12/18/2019 1615    BNP No results found for: BNP  ProBNP No results found for: PROBNP  Imaging: DG Chest 2 View  Result Date: 01/10/2020 CLINICAL DATA:  Preoperative EXAM: CHEST - 2 VIEW COMPARISON:  11/25/2017 FINDINGS: The heart size and mediastinal contours are within normal limits. Both lungs are clear. The visualized skeletal structures are unremarkable. IMPRESSION: No acute abnormality of the lungs. Electronically Signed   By: Lauralyn Primes M.D.   On: 01/10/2020 15:38   MR Brain W and Wo Contrast  Result Date: 12/18/2019 CLINICAL DATA:  Focal neuro deficit, greater than 6 hours, stroke  suspected. 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 CONTRAST:  46mL GADAVIST GADOBUTROL 1 MMOL/ML IV SOLN COMPARISON:  Brain MRI 11/08/2019. FINDINGS: Brain: Multiple sequences are mildly motion degraded. There is no evidence of acute infarct. No evidence of intracranial mass. No midline shift or extra-axial fluid collection. No chronic intracranial blood products. Minimal scattered T2/FLAIR hyperintensity within the cerebral white matter is nonspecific, but consistent with chronic small vessel ischemic disease. No abnormal intracranial enhancement is identified. Cerebral volume is normal for age. Vascular: Flow voids maintained within the proximal large arterial vessels. Skull and upper cervical spine: No focal marrow lesion Sinuses/Orbits: Visualized orbits demonstrate no acute abnormality. No significant paranasal sinus disease or mastoid effusion. IMPRESSION: Mildly motion degraded examination. No evidence of acute intracranial abnormality. Minimal chronic small vessel ischemic disease. Electronically Signed   By: Jackey Loge DO   On: 12/18/2019 18:23     Assessment & Plan:   Chronic obstructive pulmonary disease (HCC) - Stable; breathing baseline, no recent exacerbations - Exam benign. Denies shortness of breath, wheezing or cough  - Continue Spiriva and prn albuterol   OSA (obstructive sleep apnea) - Hx obstructive sleep apnea, no on CPAP d.t migraines  - Recommend patient be placed on oxygen post-op and CPAP be available if needed   Pre-operative respiratory examination - Patient has hx of small airway disease with obstruction. Breathing is stable with no recent exacerbations or acute respiratory symptoms. Continue Spiriva and prn  albuterol. Recommend using bronchodilators post op and incentive spirometry. He is mild-moderate risk for prolonged mech ventilation d.t underlying sleep apnea and COPD, discussed these risk with patient/wife. Cleared by pulmonary for surgery.    Major Pulmonary risks identified in the multifactorial risk analysis are but not limited to a) pneumonia; b) recurrent intubation risk; c) prolonged or  recurrent acute respiratory failure needing mechanical ventilation; d) prolonged hospitalization; e) DVT/Pulmonary embolism; f) Acute Pulmonary edema  Recommend 1. Short duration of surgery as much as possible and avoid paralytic if possible 2. Recovery in step down or ICU with Pulmonary consultation 3. DVT prophylaxis 4. Aggressive pulmonary toilet with o2, bronchodilatation, and incentive spirometry and early ambulation    1) RISK FOR PROLONGED MECHANICAL VENTILAION - > 48h  1A) Arozullah - Prolonged mech ventilation risk Arozullah Postperative Pulmonary Risk Score - for mech ventilation dependence >48h [employer], Ann Surg 2000, major non-cardiac surgery) Comment Score  Type of surgery - abd ao aneurysm (27), thoracic (21), neurosurgery / upper abdominal / vascular (21), neck (11) C6-7 cervical fusion 21  Emergency Surgery - (11)  0  ALbumin < 3 or poor nutritional state - (9)  0  BUN > 30 -  (8)  8  Partial or completely dependent functional status - (7)  0  COPD -  (6)  6  Age - 63 to 33 (4), > 70  (6)  0  TOTAL  35  Risk Stratifcation scores  - < 10 (0.5%), 11-19 (1.8%), 20-27 (4.2%), 28-40 (10.1%), >40 (26.6%)  10.1%      1B) GUPTA - Prolonged Mech Vent Risk Score source Risk  Guptal post op prolonged mech ventilation > 48h or reintubation < 30 days - ACS 2007-2008 dataset - http://lewis-perez.info/ 0.4 % risk mechanical ventilation     2) RISK FOR POST OP PNEUMONIA Score source Risk  Lyndel Safe - Post Op Pnemounia risk   [email or web address] 0.4 % post-op pneumonia    R3) ISK FOR ANY POST-OP PULMONARY COMPLICATION Score source Risk  CANET/ARISCAT Score - risk for ANY/ALl pulmonary complications - > risk of in-hospital post-op pulmonary complications (composite including respiratory failure, respiratory infection, pleural effusion, atelectasis, pneumothorax, bronchospasm, aspiration pneumonitis) [employer] - based on age, anemia, pulse ox, resp infection prior 30d, incision site, duration of surgery, and emergency v elective surgery 16 points/1.6 % risk of in hospital post op pulmonary complications     Martyn Ehrich, NP 01/12/2020

## 2020-01-12 ENCOUNTER — Encounter: Payer: Self-pay | Admitting: Primary Care

## 2020-01-12 DIAGNOSIS — Z01811 Encounter for preprocedural respiratory examination: Secondary | ICD-10-CM | POA: Insufficient documentation

## 2020-01-12 NOTE — Assessment & Plan Note (Signed)
-   Patient has hx of small airway disease with obstruction. Breathing is stable with no recent exacerbations or acute respiratory symptoms. Continue Spiriva and prn albuterol. Recommend using bronchodilators post op and incentive spirometry. He is mild-moderate risk for prolonged mech ventilation d.t underlying sleep apnea and COPD, discussed these risk with patient/wife. Cleared by pulmonary for surgery.

## 2020-01-12 NOTE — Assessment & Plan Note (Signed)
-   Hx obstructive sleep apnea, no on CPAP d.t migraines  - Recommend patient be placed on oxygen post-op and CPAP be available if needed

## 2020-01-12 NOTE — Assessment & Plan Note (Signed)
-   Stable; breathing baseline, no recent exacerbations - Exam benign. Denies shortness of breath, wheezing or cough  - Continue Spiriva and prn albuterol

## 2020-01-15 NOTE — Telephone Encounter (Signed)
   Primary Cardiologist: Julien Nordmann, MD  Chart reviewed as part of pre-operative protocol coverage. Patient was last seen by Dr. Mariah Milling for a virtual visit on 09/13/2019 at which time he was doing well form a cardiac standpoint. However, he has significant back problems. He underwent cervical ESI in 12/2019 and is now scheduled for C6-7 fusion. I called and spoke with patient/wife today. Patient has had some memory issues recently due to migraines so wife helped answer questions. He has had no chest pain, shortness of breath, palpitations, syncope, orthopnea, PND, or lower extremity edema. Activity is very limited due to severe back pain but no exertional symptoms. Per Revised Cardiac Risk Index, considered low risk.  Given past medical history and time since last visit, based on ACC/AHA guidelines, AYVIN LIPINSKI would be at acceptable risk for the planned procedure without further cardiovascular testing.   At time of recent cervical ESI, Dr. Mariah Milling stated patient was OK to hold Plavix for 5 days prior to procedure. Same recommendation applies to this neck surgery. Plavix should be restarted as soon as able following procedure.   I will route this recommendation to the requesting party via Epic fax function and remove from pre-op pool.  Please call with questions.  Corrin Parker, PA-C 01/15/2020, 3:42 PM

## 2020-01-15 NOTE — Telephone Encounter (Signed)
Reviewed chart. Looks like patient was cleared for cervical ESI on 12/06/2019 and now scheduled for C6-7 fusion. Suspect patient will be OK to proceed with spinal surgery based off last office visit note. Called patient to make sure he has had no new/worsening cardiac symptoms but was unable to reach him. Left message to call back and ask to speak with pre-op team.

## 2020-01-15 NOTE — Telephone Encounter (Signed)
Patients wife calling with patient  States that Dr Ethelene Browns office has not received any clearance and would need it ASAP in order to get surgery scheduled  Please review and refax

## 2020-01-16 ENCOUNTER — Other Ambulatory Visit: Payer: Self-pay | Admitting: Neurosurgery

## 2020-01-18 ENCOUNTER — Other Ambulatory Visit: Payer: Self-pay | Admitting: Internal Medicine

## 2020-01-18 ENCOUNTER — Encounter (HOSPITAL_COMMUNITY): Payer: Self-pay

## 2020-01-18 NOTE — Telephone Encounter (Signed)
DK please advise on refill.  Last prescribed 01/2019 #90 with 1 refill.

## 2020-01-18 NOTE — Pre-Procedure Instructions (Signed)
Cory Espinoza  01/18/2020   Your procedure is scheduled on Tuesday, January 23, 2020 at 10:10 AM.   Report to [facility] Entrance "A" Admitting Office at 8:10 AM.   Call this number if you have problems the morning of surgery: [phone]   Questions prior to day of surgery, please call [phone] between 8 & 4 PM.   Remember:  Do not eat or drink after midnight Monday, 01/22/20.  Take these medicines the morning of surgery with A SIP OF WATER: Alprazolam (Xanax), Cetirizine (Zyrtec), Pantoprazole (Protonix), Baclofen (Lioresal) - if needed, Nurtec - if needed, NTG - if needed, Promethazine (Phenergan) - if needed. Use your Spiriva inhaler, Duo-neb - if needed and Albuterol inhaler - if needed (bring this inhaler with you day of surgery)  Stop Plavix as instructed by cardiologist (5 days prior to surgery). Stop Herbal medications and NSAIDS (Toradol, Aleve, Ibuprofen, etc) as of today prior to surgery. Do not use Multivitamins, Aspirin products (Goody's, BC Powders, etc) or Fish Oil prior to surgery.    Do not wear jewelry.  Do not wear lotions, powders, cologne or deodorant.  Men may shave face and neck.  Do not bring valuables to the hospital.  [facility] is not responsible for any belongings or valuables.  Contacts, dentures or bridgework may not be worn into surgery.  Leave your suitcase in the car.  After surgery it may be brought to your room.  For patients admitted to the hospital, discharge time will be determined by your treatment team.  Patients discharged the day of surgery will not be allowed to drive home.   [facility] - Preparing for Surgery  Before surgery, you can play an important role.  Because skin is not sterile, your skin needs to be as free of germs as possible.  You can reduce the number of germs on you skin by washing with CHG (chlorahexidine gluconate) soap before surgery.  CHG is an antiseptic cleaner which kills germs and bonds with the  skin to continue killing germs even after washing.  Oral Hygiene is also important in reducing the risk of infection.  Remember to brush your teeth with your regular toothpaste the morning of surgery.  Please DO NOT use if you have an allergy to CHG or antibacterial soaps.  If your skin becomes reddened/irritated stop using the CHG and inform your nurse when you arrive at Short Stay.  Do not shave (including legs and underarms) for at least 48 hours prior to the first CHG shower.  You may shave your face.  Please follow these instructions carefully:   1.  Shower with CHG Soap the night before surgery and the morning of Surgery.  2.  If you choose to wash your hair, wash your hair first as usual with your normal shampoo.  3.  After you shampoo, rinse your hair and body thoroughly to remove the shampoo. 4.  Use CHG as you would any other liquid soap.  You can apply chg directly to the skin and wash gently with a      scrungie or washcloth.           5.  Apply the CHG Soap to your body ONLY FROM THE NECK DOWN.   Do not use on open wounds or open sores. Avoid contact with your eyes, ears, mouth and genitals (private parts).  Wash genitals (private parts) with your normal soap - do this prior to using CHG soap.  6.  Wash  thoroughly, paying special attention to the area where your surgery will be performed.  7.  Thoroughly rinse your body with warm water from the neck down.  8.  DO NOT shower/wash with your normal soap after using and rinsing off the CHG Soap.  9.  Pat yourself dry with a clean towel.            10.  Wear clean pajamas.            11.  Place clean sheets on your bed the night of your first shower and do not sleep with pets.  Day of Surgery  Shower as above. Do not apply any lotions/deodorants the morning of surgery.   Please wear clean clothes to the hospital. Remember to brush your teeth with toothpaste.  Please read over the fact sheets that you were given.

## 2020-01-19 ENCOUNTER — Other Ambulatory Visit: Payer: Self-pay

## 2020-01-19 ENCOUNTER — Other Ambulatory Visit
Admission: RE | Admit: 2020-01-19 | Discharge: 2020-01-19 | Disposition: A | Discharge status: Home / Self Care | Payer: BC Managed Care – PPO | Source: Ambulatory Visit | Attending: Neurosurgery | Admitting: Neurosurgery

## 2020-01-19 ENCOUNTER — Encounter (HOSPITAL_COMMUNITY): Payer: Self-pay

## 2020-01-19 ENCOUNTER — Encounter (HOSPITAL_COMMUNITY)
Admission: RE | Admit: 2020-01-19 | Discharge: 2020-01-19 | Disposition: A | Discharge status: Home / Self Care | Payer: BC Managed Care – PPO | Source: Ambulatory Visit | Attending: Neurosurgery | Admitting: Neurosurgery

## 2020-01-19 DIAGNOSIS — Z01812 Encounter for preprocedural laboratory examination: Secondary | ICD-10-CM | POA: Insufficient documentation

## 2020-01-19 DIAGNOSIS — Z01818 Encounter for other preprocedural examination: Secondary | ICD-10-CM | POA: Insufficient documentation

## 2020-01-19 DIAGNOSIS — Z20822 Contact with and (suspected) exposure to covid-19: Secondary | ICD-10-CM | POA: Diagnosis not present

## 2020-01-19 HISTORY — DX: Headache, unspecified: R51.9

## 2020-01-19 HISTORY — DX: Sleep apnea, unspecified: G47.30

## 2020-01-19 HISTORY — DX: Unspecified atrial fibrillation: I48.91

## 2020-01-19 HISTORY — DX: Personal history of urinary calculi: Z87.442

## 2020-01-19 HISTORY — DX: Unspecified osteoarthritis, unspecified site: M19.90

## 2020-01-19 HISTORY — DX: Gastro-esophageal reflux disease without esophagitis: K21.9

## 2020-01-19 HISTORY — DX: Pneumonia, unspecified organism: J18.9

## 2020-01-19 HISTORY — DX: Acute myocardial infarction, unspecified: I21.9

## 2020-01-19 LAB — CBC WITH DIFFERENTIAL/PLATELET
Abs Immature Granulocytes: 0.02 10*3/uL (ref 0.00–0.07)
Basophils Absolute: 0.1 10*3/uL (ref 0.0–0.1)
Basophils Relative: 1 %
Eosinophils Absolute: 0.1 10*3/uL (ref 0.0–0.5)
Eosinophils Relative: 1 %
HCT: 48.2 % (ref 39.0–52.0)
Hemoglobin: 16.2 g/dL (ref 13.0–17.0)
Immature Granulocytes: 0 %
Lymphocytes Relative: 28 %
Lymphs Abs: 1.9 10*3/uL (ref 0.7–4.0)
MCH: 33.1 pg (ref 26.0–34.0)
MCHC: 33.6 g/dL (ref 30.0–36.0)
MCV: 98.4 fL (ref 80.0–100.0)
Monocytes Absolute: 0.5 10*3/uL (ref 0.1–1.0)
Monocytes Relative: 7 %
Neutro Abs: 4.5 10*3/uL (ref 1.7–7.7)
Neutrophils Relative %: 63 %
Platelets: 216 10*3/uL (ref 150–400)
RBC: 4.9 MIL/uL (ref 4.22–5.81)
RDW: 12.4 % (ref 11.5–15.5)
WBC: 7 10*3/uL (ref 4.0–10.5)
nRBC: 0 % (ref 0.0–0.2)

## 2020-01-19 LAB — BASIC METABOLIC PANEL
Anion gap: 8 (ref 5–15)
BUN: 11 mg/dL (ref 6–20)
CO2: 22 mmol/L (ref 22–32)
Calcium: 9.1 mg/dL (ref 8.9–10.3)
Chloride: 111 mmol/L (ref 98–111)
Creatinine, Ser: 1.01 mg/dL (ref 0.61–1.24)
GFR calc Af Amer: >60 mL/min (ref >60)
GFR calc non Af Amer: >60 mL/min (ref >60)
Glucose, Bld: 94 mg/dL (ref 70–99)
Potassium: 4.1 mmol/L (ref 3.5–5.1)
Sodium: 141 mmol/L (ref 135–145)

## 2020-01-19 LAB — SURGICAL PCR SCREEN
MRSA, PCR: NEGATIVE
Staphylococcus aureus: NEGATIVE

## 2020-01-19 NOTE — Anesthesia Preprocedure Evaluation (Addendum)
Anesthesia Evaluation  Patient identified by MRN, date of birth, ID band Patient awake    Reviewed: Allergy & Precautions, NPO status , Patient's Chart, lab work & pertinent test results  History of Anesthesia Complications Negative for: history of anesthetic complications  Airway Mallampati: II  TM Distance: >3 FB Neck ROM: Full    Dental no notable dental hx. (+) Dental Advisory Given   Pulmonary COPD, former smoker,  Pulmonology preoperative evaluation on 01/12/20 by Ames Dura, NP. She wrote, "Patient has hx of small airway disease with obstruction. Breathing is stable with no recent exacerbations or acute respiratory symptoms. Continue Spiriva and prn albuterol. Recommend using bronchodilators post op and incentive spirometry. He is mild-moderate risk for prolonged mech ventilation d.t underlying sleep apnea and COPD, discussed these risk with patient/wife. Cleared by pulmonary for surgery.... Recommend 1. Short duration of surgery as much as possible and avoid paralytic if possible 2. Recovery in step down or ICU with Pulmonary consultation 3. DVT prophylaxis 4. Aggressive pulmonary toilet with o2, bronchodilatation, and incentive spirometry and early ambulation". Since he is currently intolerant to CPAP, she also recommended O2 post-operatively and CPAP available if needed.    Pulmonary exam normal        Cardiovascular hypertension, + [name] and + Past MI  Normal cardiovascular exam+ dysrhythmias Atrial Fibrillation   - Cardiology preoperative input outlined on 01/15/20 by Marjie Skiff, PA-C, "...Given past medical history and time since last visit, based on ACC/AHA guidelines,Fred D Jenkinswould be at acceptable risk for the planned procedure without further cardiovascular testing.  At time of recent cervical ESI, Dr. Mariah Milling stated patient was OK to hold Plavix for 5 days prior to procedure. Same recommendation applies  to this neck surgery. Plavix should be restarted as soon as able following procedure." Last Plavix 01/16/20.  Assessment The patient has had progressive canadian class 4 anginal symptoms with a  risk factors including high blood pressure and high cholesterol.  normal left ventricular function with ejection fraction of 60%  severe 1 vessel coronary artery disease   There is significant stenosis of 1st and 2nd obtuse marginal  Plan Continue medical management of [name] risk factors, Consider PCI and stent placement in culprit artery and Additional medications for management of angina and will proceed to FFR for further evaluation   Neuro/Psych Anxiety    GI/Hepatic Neg liver ROS, GERD  ,  Endo/Other  negative endocrine ROS  Renal/GU negative Renal ROS     Musculoskeletal negative musculoskeletal ROS (+)   Abdominal   Peds  Hematology negative hematology ROS (+)   Anesthesia Other Findings Day of surgery medications reviewed with the patient.  Reproductive/Obstetrics                           Anesthesia Physical Anesthesia Plan  ASA: III  Anesthesia Plan: General   Post-op Pain Management:    Induction: Intravenous  PONV Risk Score and Plan: 3 and Ondansetron and Midazolam  Airway Management Planned: Oral ETT  Additional Equipment:   Intra-op Plan:   Post-operative Plan: Extubation in OR  Informed Consent: I have reviewed the patients History and Physical, chart, labs and discussed the procedure including the risks, benefits and alternatives for the proposed anesthesia with the patient or authorized representative who has indicated his/her understanding and acceptance.     Dental advisory given  Plan Discussed with: CRNA and Anesthesiologist  Anesthesia Plan Comments: (See PAT note written 01/19/2020 by Revonda Standard  Zelenak, PA-C. Has cardiology and pulmonology preoperative input. On-going evaluation by neurology for intractable headaches  and bouts of confusion. Brain MRI without acute findings. Currently unable to tolerate CPAP due to headaches.   )      Anesthesia Quick Evaluation

## 2020-01-19 NOTE — Progress Notes (Signed)
PCP - Dr. Jerl Mina Cardiologist - Dr. Mariah Milling Neurologist - Dr. Sherryll Burger  Chest x-ray - 01/10/20 EKG - 01/19/20 Stress Test - denies ECHO - denies Cardiac Cath - 04/27/16  Sleep Study -  CPAP - doesn't use since onset of migraines in Oct, 2020  Blood Thinner Instructions: Hold Plavix 5 days prior - last dose 01/16/20  COVID TEST- going today.   Anesthesia review: Pt has cognitive issues since onset of [name] in Oct, 2020. Pt sees Dr. Sherryll Burger. Pt had trouble remembering medical history. Brought pt's wife into the appt. She has had to take his keys away because he has gotten lost going to his job that he has been on sick leave from. Has Pulmonary clearance and Cardiac clearance in Epic  Patient denies shortness of breath, fever, cough and chest pain at PAT appointment   All instructions explained to the patient, with a verbal understanding of the material. Patient agrees to go over the instructions while at home for a better understanding. Patient also instructed to self quarantine after being tested for COVID-19. The opportunity to ask questions was provided.

## 2020-01-19 NOTE — Progress Notes (Addendum)
Anesthesia Chart Review:  Case: [id number] Date/Time: 01/23/20 1125   Procedure: ACDF - C6-C7 (N/A )   Anesthesia type: General   Pre-op diagnosis: Stenosis   Location: [facility] OR ROOM 23 / [place] OR   Surgeons: Earnie Larsson, MD      DISCUSSION: Patient is a 49 year old male scheduled for the above procedure.  History includes former smoker (quit 10/08/12), [name] (reported MI age 16, although not mentioned in cardiology notes, does known [name] with strong family history of MI < 15 years old; on Plavix for reduction of cardiovascular event), afib episode (2005), HTN, HLD, COPD, GERD, OSA (intolerant to CPAP due to headaches), anxiety, headaches, spinal surgery (1998).  - ED visit 12/18/19 for altered mental status/confusion, progressive since at least 10/2019. MRI without acute findings (see below for more details regarding on-going neurology work-up).   - Pulmonology preoperative evaluation on 01/12/20 by Geraldo Pitter, NP. She wrote, "Patient has hx of small airway disease with obstruction. Breathing is stable with no recent exacerbations or acute respiratory symptoms. Continue Spiriva and prn albuterol. Recommend using bronchodilators post op and incentive spirometry. He is mild-moderate risk for prolonged mech ventilation d.t underlying sleep apnea and COPD, discussed these risk with patient/wife. Cleared by pulmonary for surgery.... Recommend 1. Short duration of surgery as much as possible and avoid paralytic if possible 2. Recovery in step down or ICU with Pulmonary consultation 3. DVT prophylaxis 4. Aggressive pulmonary toilet with o2, bronchodilatation, and incentive spirometry and early ambulation". Since he is currently intolerant to CPAP, she also recommended O2 post-operatively and CPAP available if needed.  - Cardiology preoperative input outlined on 01/15/20 by Sande Rives, PA-C, "...Given past medical history and time since last visit, based on ACC/AHA guidelines, Cory Espinoza would be at  acceptable risk for the planned procedure without further cardiovascular testing.  At time of recent cervical ESI, Dr. Rockey Situ stated patient was OK to hold Plavix for 5 days prior to procedure. Same recommendation applies to this neck surgery. Plavix should be restarted as soon as able following procedure."  Last Plavix 01/16/20.  He has been seeing neurology and physiatry for severe migraines and cognitive decline. First saw neurology in 10/2019.  He has undergone 2 MRIs of the brain, MRIs of the cervical and lumbar spine, EMG, multiple nerve blocks (occipital nerve block 10/19/19; sphenopalatine ganglion and maxillary division of trigeminal nerve block 11/27/19, 12/04/19, and 12/11/19), right C7-T1 interlaminar epidural steroid injection (12/14/19). He was referred to neurosurgery and also neuropsychology.  It looks like he also has a pending lumbar puncture. 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   01/19/20 presurgical COVID-19 test is in process. Reviewed available information with anesthesiologist Nolon Nations, MD. Assigned anesthesiologist to further evaluate on the day of surgery.    VS: BP 121/73   Pulse 64   Temp 36.7 C (Oral)   Resp 18   Ht 6' (1.829 m)   Wt 100.5 kg   SpO2 99%   BMI 30.05 kg/m     PROVIDERS: Maryland Pink, MD is PCP  - Ida Rogue, MD is cardiologist - Flora Lipps, MD is pulmonologist - Jennings Books, MD is neurologist Jefm Bryant, see [facility]). Initial consult 10/18/19 for "Migraine with intractable headache with aura with status migrainosus + some component of right sided Occipital Neuralgia.  Prior history of headaches since around 2014. 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 As headaches progressed, his wife noted bouts of confusion/cognitive decline (more pronounced 12/28/18 with ED visit for AMS). MRI brain showed  no acute abnormality. Neuropsych and LP are pending per neurology. Merri Ray, DO is physiatrist Gavin Potters, see [facility] Everywhere).    LABS: Labs reviewed: Acceptable for surgery. (all labs ordered are listed, but only abnormal results are displayed)  Labs Reviewed  SURGICAL PCR SCREEN  BASIC METABOLIC PANEL  CBC WITH DIFFERENTIAL/PLATELET     OTHER:  EMG 11/21/19 ([facility]): Impression: This is an abnormal electrodiagnostic exam consistent with 1) Right mild (grade II) carpal tunnel syndrome (median nerve entrapment at wrist). carpal tunnel syndrome (median nerve entrapment at wrist).  Clinical Correlation: Patient's history and exam were concerning for right C7 radiculopathy but nerve conduction study did not support that.    PFT 12/16/17 "Small airways obstruction with restrictive lung disease from increased weight FEF 25/75 reduced to 66% predicted Fev1/FVC ratio WNL FVC reduced TLC reduced DLCO NL when corrected for volumes" FVC 3.04 (57%), FEV1 2.52 (58%), TLC 4.75 (64%)   IMAGES: CXR 01/10/20: FINDINGS: The heart size and mediastinal contours are within normal limits. Both lungs are clear. The visualized skeletal structures are unremarkable. IMPRESSION: No acute abnormality of the lungs.  MRI Brain 12/18/19: IMPRESSION: Mildly motion degraded examination. No evidence of acute intracranial abnormality. Minimal chronic small vessel ischemic disease.  MRI L-spine 12/07/19: IMPRESSION: - Lumbar spondylosis as outlined and greatest at L4-L5 and L5-S1. - At L4-L5, a small caudally migrated central/right subarticular disc extrusion contributes to mild right subarticular narrowing with contact upon the descending right L5 nerve root. There is also mild relative narrowing of the central canal and minimal right neural foraminal narrowing at this level. - At L5-S1, a broad-based central/right subarticular disc extrusion with minimal caudal migration  contributes to right greater than left subarticular stenosis. Encroachment upon the descending right S1 nerve root. Mild bilateral neural foraminal narrowing also present at this level.  MRI C-spine 11/08/19: IMPRESSION: Isolated cervical disc degeneration at C6-C7 with a small rightward disc herniation. Capacious underlying spinal canal with no significant spinal stenosis, but query Right C7 radiculitis.   EKG: 01/19/20: SB at 54 bpm   CV: Cardiac cath/FFR 04/27/16:  Suezanne Jacquet 2nd Mrg lesion, 75% stenosed.  Mid Cx lesion, 70% stenosed. Assessment - The patient has had progressive canadian class 4 anginal symptoms with a  risk factors including high blood pressure and high cholesterol. - Normal left ventricular function with ejection fraction of 60% - Severe 1 vessel coronary artery disease  - There is significant stenosis of 1st and 2nd obtuse marginal Plan Continue medical management of [name] risk factors, Consider PCI and stent placement in culprit artery and Additional medications for management of angina and will proceed to [facility] for further evaluation  Ost RPDA lesion, 60% stenosed. The lesion was not previously treated.  Dist RCA lesion, 50% stenosed. The lesion was not previously treated.  negative FFR of AV groove lesion in OM1 ostial lesion   Past Medical History:  Diagnosis Date  . Allergy   . Anxiety   . Arthritis    lumbar spine  . Atrial fibrillation (HCC)   . COPD (chronic obstructive pulmonary disease) (HCC)    Restrictive lung disease  . Coronary artery disease   . GERD (gastroesophageal reflux disease)   . Headache    [name], onset October 2020, cluster headaches in the past  . History of  kidney stones   . Hyperlipidemia   . Hypertension   . Myocardial infarction (HCC)    at age 22  . Pneumonia   . Sleep apnea    unable to use cpap since onset of migraines    Past Surgical History:  Procedure Laterality Date  . BACK SURGERY  1996  .  CARDIAC CATHETERIZATION Left 04/27/2016   Procedure: Left Heart Cath and Coronary Angiography;  Surgeon: Lamar Blinks, MD;  Location: [facility] INVASIVE CV LAB;  Service: Cardiovascular;  Laterality: Left;  . CARDIAC CATHETERIZATION N/A 04/27/2016   Procedure: Intravascular Pressure Wire/FFR Study;  Surgeon: Alwyn Pea, MD;  Location: [facility] INVASIVE CV LAB;  Service: Cardiovascular;  Laterality: N/A;  . CATHETERIZATION OF PULMONARY ARTERY WITH RETRIEVAL OF FOREIGN BODY Bilateral 04/07/2011   heart  . KNEE ARTHROSCOPY Bilateral 2004  . LUMBAR DISC SURGERY  1999    MEDICATIONS: . AIMOVIG 70 MG/ML SOAJ  . albuterol (PROVENTIL HFA;VENTOLIN HFA) 108 (90 Base) MCG/ACT inhaler  . ALPRAZolam (XANAX) 1 MG tablet  . atorvastatin (LIPITOR) 40 MG tablet  . B COMPLEX VITAMINS PO  . baclofen (LIORESAL) 10 MG tablet  . benzonatate (TESSALON) 100 MG capsule  . cetirizine (ZYRTEC) 10 MG tablet  . Cholecalciferol (VITAMIN D) 125 MCG (5000 UT) CAPS  . clopidogrel (PLAVIX) 75 MG tablet  . escitalopram (LEXAPRO) 20 MG tablet  . gabapentin (NEURONTIN) 300 MG capsule  . ipratropium-albuterol (DUONEB) 0.5-2.5 (3) MG/3ML SOLN  . ketorolac (TORADOL) 10 MG tablet  . MAGNESIUM CITRATE PO  . Melatonin 10 MG TABS  . nitroGLYCERIN (NITROSTAT) 0.4 MG SL tablet  . NURTEC 75 MG TBDP  . pantoprazole (PROTONIX) 40 MG tablet  . POTASSIUM PO  . promethazine (PHENERGAN) 25 MG tablet  . pyridOXINE (VITAMIN B-6) 100 MG tablet  . Saw Palmetto 450 MG CAPS  . Tiotropium Bromide Monohydrate (SPIRIVA RESPIMAT) 1.25 MCG/ACT AERS  . zonisamide (ZONEGRAN) 100 MG capsule   No current facility-administered medications for this encounter.    Shonna Chock, PA-C Surgical Short Stay/Anesthesiology [facility] Phone [phone] [facility] Phone [phone] 01/19/2020 4:00 PM

## 2020-01-20 LAB — SARS CORONAVIRUS 2 (TAT 6-24 HRS): SARS Coronavirus 2: NEGATIVE

## 2020-01-22 MED ORDER — VANCOMYCIN HCL 1500 MG/300ML IV SOLN
1500.0000 mg | INTRAVENOUS | Status: DC
  Filled 2020-01-22: qty 300

## 2020-01-22 MED ORDER — VANCOMYCIN HCL 1500 MG/300ML IV SOLN
1500.0000 mg | INTRAVENOUS | Status: AC
  Administered 2020-01-23: 1500 mg via INTRAVENOUS
  Filled 2020-01-22: qty 300

## 2020-01-23 ENCOUNTER — Ambulatory Visit (HOSPITAL_COMMUNITY)
Admission: AD | Admit: 2020-01-23 | Discharge: 2020-01-24 | Disposition: A | Discharge status: Home / Self Care | Payer: BC Managed Care – PPO | Attending: Neurosurgery | Admitting: Neurosurgery

## 2020-01-23 ENCOUNTER — Ambulatory Visit (HOSPITAL_COMMUNITY): Payer: BC Managed Care – PPO | Admitting: Anesthesiology

## 2020-01-23 ENCOUNTER — Other Ambulatory Visit: Payer: Self-pay

## 2020-01-23 ENCOUNTER — Encounter (HOSPITAL_COMMUNITY): Payer: Self-pay | Admitting: Neurosurgery

## 2020-01-23 ENCOUNTER — Encounter (HOSPITAL_COMMUNITY)
Admission: AD | Disposition: A | Discharge status: Home / Self Care | Payer: Self-pay | Source: Home / Self Care | Attending: Neurosurgery

## 2020-01-23 ENCOUNTER — Ambulatory Visit (HOSPITAL_COMMUNITY): Payer: BC Managed Care – PPO | Admitting: Vascular Surgery

## 2020-01-23 ENCOUNTER — Ambulatory Visit (HOSPITAL_COMMUNITY): Payer: BC Managed Care – PPO

## 2020-01-23 DIAGNOSIS — Z880 Allergy status to penicillin: Secondary | ICD-10-CM | POA: Insufficient documentation

## 2020-01-23 DIAGNOSIS — Z87442 Personal history of urinary calculi: Secondary | ICD-10-CM | POA: Insufficient documentation

## 2020-01-23 DIAGNOSIS — M47816 Spondylosis without myelopathy or radiculopathy, lumbar region: Secondary | ICD-10-CM | POA: Insufficient documentation

## 2020-01-23 DIAGNOSIS — Z791 Long term (current) use of non-steroidal anti-inflammatories (NSAID): Secondary | ICD-10-CM | POA: Diagnosis not present

## 2020-01-23 DIAGNOSIS — F419 Anxiety disorder, unspecified: Secondary | ICD-10-CM | POA: Insufficient documentation

## 2020-01-23 DIAGNOSIS — K219 Gastro-esophageal reflux disease without esophagitis: Secondary | ICD-10-CM | POA: Insufficient documentation

## 2020-01-23 DIAGNOSIS — I10 Essential (primary) hypertension: Secondary | ICD-10-CM | POA: Insufficient documentation

## 2020-01-23 DIAGNOSIS — Z886 Allergy status to analgesic agent status: Secondary | ICD-10-CM | POA: Insufficient documentation

## 2020-01-23 DIAGNOSIS — I2510 Atherosclerotic heart disease of native coronary artery without angina pectoris: Secondary | ICD-10-CM | POA: Diagnosis not present

## 2020-01-23 DIAGNOSIS — I4891 Unspecified atrial fibrillation: Secondary | ICD-10-CM | POA: Diagnosis not present

## 2020-01-23 DIAGNOSIS — Z8249 Family history of ischemic heart disease and other diseases of the circulatory system: Secondary | ICD-10-CM | POA: Insufficient documentation

## 2020-01-23 DIAGNOSIS — Z881 Allergy status to other antibiotic agents status: Secondary | ICD-10-CM | POA: Diagnosis not present

## 2020-01-23 DIAGNOSIS — M5020 Other cervical disc displacement, unspecified cervical region: Secondary | ICD-10-CM | POA: Diagnosis present

## 2020-01-23 DIAGNOSIS — Z419 Encounter for procedure for purposes other than remedying health state, unspecified: Secondary | ICD-10-CM

## 2020-01-23 DIAGNOSIS — G43809 Other migraine, not intractable, without status migrainosus: Secondary | ICD-10-CM | POA: Insufficient documentation

## 2020-01-23 DIAGNOSIS — Z803 Family history of malignant neoplasm of breast: Secondary | ICD-10-CM | POA: Insufficient documentation

## 2020-01-23 DIAGNOSIS — Z87891 Personal history of nicotine dependence: Secondary | ICD-10-CM | POA: Insufficient documentation

## 2020-01-23 DIAGNOSIS — M4712 Other spondylosis with myelopathy, cervical region: Secondary | ICD-10-CM | POA: Diagnosis present

## 2020-01-23 DIAGNOSIS — J449 Chronic obstructive pulmonary disease, unspecified: Secondary | ICD-10-CM | POA: Diagnosis not present

## 2020-01-23 DIAGNOSIS — M50123 Cervical disc disorder at C6-C7 level with radiculopathy: Secondary | ICD-10-CM | POA: Diagnosis not present

## 2020-01-23 DIAGNOSIS — Z885 Allergy status to narcotic agent status: Secondary | ICD-10-CM | POA: Diagnosis not present

## 2020-01-23 DIAGNOSIS — Z79899 Other long term (current) drug therapy: Secondary | ICD-10-CM | POA: Insufficient documentation

## 2020-01-23 DIAGNOSIS — G4730 Sleep apnea, unspecified: Secondary | ICD-10-CM | POA: Insufficient documentation

## 2020-01-23 DIAGNOSIS — M4802 Spinal stenosis, cervical region: Secondary | ICD-10-CM | POA: Insufficient documentation

## 2020-01-23 DIAGNOSIS — Z833 Family history of diabetes mellitus: Secondary | ICD-10-CM | POA: Insufficient documentation

## 2020-01-23 DIAGNOSIS — I252 Old myocardial infarction: Secondary | ICD-10-CM | POA: Insufficient documentation

## 2020-01-23 DIAGNOSIS — E785 Hyperlipidemia, unspecified: Secondary | ICD-10-CM | POA: Diagnosis not present

## 2020-01-23 HISTORY — PX: ANTERIOR CERVICAL DECOMP/DISCECTOMY FUSION: SHX1161

## 2020-01-23 SURGERY — ANTERIOR CERVICAL DECOMPRESSION/DISCECTOMY FUSION 1 LEVEL
Anesthesia: General | Site: Spine Cervical

## 2020-01-23 MED ORDER — MELATONIN 3 MG PO TABS
30.0000 mg | ORAL_TABLET | Freq: Every day | ORAL | Status: DC
  Administered 2020-01-23: 30 mg via ORAL
  Filled 2020-01-23: qty 10

## 2020-01-23 MED ORDER — DEXAMETHASONE SODIUM PHOSPHATE 10 MG/ML IJ SOLN: 10.0000 mg | Freq: Once | INTRAMUSCULAR | Status: DC

## 2020-01-23 MED ORDER — LIDOCAINE 2% (20 MG/ML) 5 ML SYRINGE
INTRAMUSCULAR | Status: DC | PRN
  Administered 2020-01-23: 100 mg via INTRAVENOUS

## 2020-01-23 MED ORDER — ESCITALOPRAM OXALATE 20 MG PO TABS
20.0000 mg | ORAL_TABLET | Freq: Every day | ORAL | Status: DC
  Administered 2020-01-23: 20 mg via ORAL
  Filled 2020-01-23: qty 1

## 2020-01-23 MED ORDER — ACETAMINOPHEN 325 MG PO TABS: 650.0000 mg | ORAL_TABLET | ORAL | Status: DC | PRN

## 2020-01-23 MED ORDER — B COMPLEX VITAMINS PO CAPS: 1.0000 | ORAL_CAPSULE | Freq: Every day | ORAL | Status: DC

## 2020-01-23 MED ORDER — PROPOFOL 10 MG/ML IV BOLUS
INTRAVENOUS | Status: DC | PRN
  Administered 2020-01-23: 180 mg via INTRAVENOUS

## 2020-01-23 MED ORDER — CYCLOBENZAPRINE HCL 10 MG PO TABS
10.0000 mg | ORAL_TABLET | Freq: Three times a day (TID) | ORAL | Status: DC | PRN
  Administered 2020-01-23 – 2020-01-24 (×2): 10 mg via ORAL
  Filled 2020-01-23 (×2): qty 1

## 2020-01-23 MED ORDER — PANTOPRAZOLE SODIUM 40 MG PO TBEC
40.0000 mg | DELAYED_RELEASE_TABLET | Freq: Every day | ORAL | Status: DC
  Administered 2020-01-23: 40 mg via ORAL
  Filled 2020-01-23 (×2): qty 1

## 2020-01-23 MED ORDER — MENTHOL 3 MG MT LOZG
1.0000 | LOZENGE | OROMUCOSAL | Status: DC | PRN
  Administered 2020-01-23: 3 mg via ORAL
  Filled 2020-01-23: qty 9

## 2020-01-23 MED ORDER — VITAMIN D3 25 MCG (1000 UNIT) PO TABS
5000.0000 [IU] | ORAL_TABLET | Freq: Every day | ORAL | Status: DC
  Administered 2020-01-23: 5000 [IU] via ORAL
  Filled 2020-01-23 (×4): qty 5

## 2020-01-23 MED ORDER — SODIUM CHLORIDE 0.9% FLUSH
3.0000 mL | Freq: Two times a day (BID) | INTRAVENOUS | Status: DC
  Administered 2020-01-23: 3 mL via INTRAVENOUS

## 2020-01-23 MED ORDER — DOCUSATE SODIUM 100 MG PO CAPS
100.0000 mg | ORAL_CAPSULE | Freq: Two times a day (BID) | ORAL | Status: DC
  Administered 2020-01-23 (×2): 100 mg via ORAL
  Filled 2020-01-23 (×3): qty 1

## 2020-01-23 MED ORDER — ALPRAZOLAM 0.5 MG PO TABS
1.0000 mg | ORAL_TABLET | Freq: Three times a day (TID) | ORAL | Status: DC
  Administered 2020-01-23 (×2): 1 mg via ORAL
  Filled 2020-01-23 (×3): qty 2

## 2020-01-23 MED ORDER — PHENOL 1.4 % MT LIQD: 1.0000 | OROMUCOSAL | Status: DC | PRN

## 2020-01-23 MED ORDER — ZONISAMIDE 100 MG PO CAPS
300.0000 mg | ORAL_CAPSULE | Freq: Every day | ORAL | Status: DC
  Administered 2020-01-23: 300 mg via ORAL
  Filled 2020-01-23: qty 3

## 2020-01-23 MED ORDER — ACETAMINOPHEN 500 MG PO TABS: 1000.0000 mg | ORAL_TABLET | Freq: Once | ORAL | Status: AC

## 2020-01-23 MED ORDER — CELECOXIB 200 MG PO CAPS: 400.0000 mg | ORAL_CAPSULE | Freq: Once | ORAL | Status: AC

## 2020-01-23 MED ORDER — RIMEGEPANT SULFATE 75 MG PO TBDP: 75.0000 mg | ORAL_TABLET | Freq: Every day | ORAL | Status: DC | PRN

## 2020-01-23 MED ORDER — ATORVASTATIN CALCIUM 40 MG PO TABS
40.0000 mg | ORAL_TABLET | Freq: Every day | ORAL | Status: DC
  Administered 2020-01-23: 40 mg via ORAL
  Filled 2020-01-23: qty 1

## 2020-01-23 MED ORDER — 0.9 % SODIUM CHLORIDE (POUR BTL) OPTIME
TOPICAL | Status: DC | PRN
  Administered 2020-01-23: 1000 mL

## 2020-01-23 MED ORDER — LACTATED RINGERS IV SOLN: INTRAVENOUS | Status: DC

## 2020-01-23 MED ORDER — HEMOSTATIC AGENTS (NO CHARGE) OPTIME
TOPICAL | Status: DC | PRN
  Administered 2020-01-23: 1 via TOPICAL

## 2020-01-23 MED ORDER — LORATADINE 10 MG PO TABS
10.0000 mg | ORAL_TABLET | Freq: Every day | ORAL | Status: DC
  Administered 2020-01-23: 10 mg via ORAL
  Filled 2020-01-23 (×2): qty 1

## 2020-01-23 MED ORDER — FENTANYL CITRATE (PF) 100 MCG/2ML IJ SOLN
25.0000 ug | INTRAMUSCULAR | Status: DC | PRN
  Administered 2020-01-23 (×2): 25 ug via INTRAVENOUS

## 2020-01-23 MED ORDER — SUCCINYLCHOLINE CHLORIDE 200 MG/10ML IV SOSY
PREFILLED_SYRINGE | INTRAVENOUS | Status: DC | PRN
  Administered 2020-01-23: 160 mg via INTRAVENOUS

## 2020-01-23 MED ORDER — VANCOMYCIN HCL 1500 MG/300ML IV SOLN
1500.0000 mg | Freq: Once | INTRAVENOUS | Status: AC
  Administered 2020-01-23: 1500 mg via INTRAVENOUS
  Filled 2020-01-23: qty 300

## 2020-01-23 MED ORDER — SAW PALMETTO 450 MG PO CAPS: 900.0000 mg | ORAL_CAPSULE | Freq: Every day | ORAL | Status: DC

## 2020-01-23 MED ORDER — ALBUTEROL SULFATE HFA 108 (90 BASE) MCG/ACT IN AERS: 1.0000 | INHALATION_SPRAY | RESPIRATORY_TRACT | Status: DC | PRN

## 2020-01-23 MED ORDER — CEFAZOLIN SODIUM-DEXTROSE 2-4 GM/100ML-% IV SOLN: 2.0000 g | Freq: Three times a day (TID) | INTRAVENOUS | Status: DC

## 2020-01-23 MED ORDER — ONDANSETRON HCL 4 MG/2ML IJ SOLN: 4.0000 mg | Freq: Four times a day (QID) | INTRAMUSCULAR | Status: DC | PRN

## 2020-01-23 MED ORDER — GABAPENTIN 300 MG PO CAPS
600.0000 mg | ORAL_CAPSULE | Freq: Every day | ORAL | Status: DC
  Administered 2020-01-23: 600 mg via ORAL
  Filled 2020-01-23: qty 2

## 2020-01-23 MED ORDER — CHLORHEXIDINE GLUCONATE CLOTH 2 % EX PADS: 6.0000 | MEDICATED_PAD | Freq: Once | CUTANEOUS | Status: DC

## 2020-01-23 MED ORDER — ALBUTEROL SULFATE (2.5 MG/3ML) 0.083% IN NEBU: 2.5000 mg | INHALATION_SOLUTION | RESPIRATORY_TRACT | Status: DC | PRN

## 2020-01-23 MED ORDER — UMECLIDINIUM BROMIDE 62.5 MCG/INH IN AEPB
1.0000 | INHALATION_SPRAY | Freq: Every day | RESPIRATORY_TRACT | Status: DC
  Administered 2020-01-24: 1 via RESPIRATORY_TRACT
  Filled 2020-01-23: qty 7

## 2020-01-23 MED ORDER — MIDAZOLAM HCL 2 MG/2ML IJ SOLN
INTRAMUSCULAR | Status: AC
  Filled 2020-01-23: qty 2

## 2020-01-23 MED ORDER — FENTANYL CITRATE (PF) 100 MCG/2ML IJ SOLN
INTRAMUSCULAR | Status: AC
  Filled 2020-01-23: qty 2

## 2020-01-23 MED ORDER — ONDANSETRON HCL 4 MG/2ML IJ SOLN
INTRAMUSCULAR | Status: DC | PRN
  Administered 2020-01-23: 4 mg via INTRAVENOUS

## 2020-01-23 MED ORDER — BENZONATATE 100 MG PO CAPS
200.0000 mg | ORAL_CAPSULE | Freq: Three times a day (TID) | ORAL | Status: DC | PRN
  Filled 2020-01-23: qty 2

## 2020-01-23 MED ORDER — PROPOFOL 10 MG/ML IV BOLUS
INTRAVENOUS | Status: AC
  Filled 2020-01-23: qty 20

## 2020-01-23 MED ORDER — CELECOXIB 200 MG PO CAPS
ORAL_CAPSULE | ORAL | Status: AC
  Administered 2020-01-23: 400 mg via ORAL
  Filled 2020-01-23: qty 2

## 2020-01-23 MED ORDER — ACETAMINOPHEN 500 MG PO TABS
ORAL_TABLET | ORAL | Status: AC
  Administered 2020-01-23: 1000 mg via ORAL
  Filled 2020-01-23: qty 2

## 2020-01-23 MED ORDER — B COMPLEX-C PO TABS
1.0000 | ORAL_TABLET | Freq: Every day | ORAL | Status: DC
  Filled 2020-01-23 (×2): qty 1

## 2020-01-23 MED ORDER — ONDANSETRON HCL 4 MG PO TABS: 4.0000 mg | ORAL_TABLET | Freq: Four times a day (QID) | ORAL | Status: DC | PRN

## 2020-01-23 MED ORDER — THROMBIN 5000 UNITS EX SOLR
CUTANEOUS | Status: AC
  Filled 2020-01-23: qty 5000

## 2020-01-23 MED ORDER — SODIUM CHLORIDE 0.9 % IV SOLN
INTRAVENOUS | Status: DC | PRN
  Administered 2020-01-23: 500 mL

## 2020-01-23 MED ORDER — IPRATROPIUM-ALBUTEROL 0.5-2.5 (3) MG/3ML IN SOLN: 3.0000 mL | RESPIRATORY_TRACT | Status: DC | PRN

## 2020-01-23 MED ORDER — POTASSIUM 99 MG PO TABS: ORAL_TABLET | Freq: Every day | ORAL | Status: DC

## 2020-01-23 MED ORDER — OXYCODONE-ACETAMINOPHEN 5-325 MG PO TABS
1.0000 | ORAL_TABLET | ORAL | Status: DC | PRN
  Administered 2020-01-23 – 2020-01-24 (×5): 2 via ORAL
  Filled 2020-01-23 (×5): qty 2

## 2020-01-23 MED ORDER — SODIUM CHLORIDE 0.9 % IV SOLN: INTRAVENOUS | Status: DC

## 2020-01-23 MED ORDER — ROCURONIUM BROMIDE 10 MG/ML (PF) SYRINGE
PREFILLED_SYRINGE | INTRAVENOUS | Status: DC | PRN
  Administered 2020-01-23: 50 mg via INTRAVENOUS

## 2020-01-23 MED ORDER — THROMBIN 5000 UNITS EX SOLR
CUTANEOUS | Status: DC | PRN
  Administered 2020-01-23 (×2): 5000 [IU] via TOPICAL

## 2020-01-23 MED ORDER — THROMBIN 5000 UNITS EX SOLR
CUTANEOUS | Status: AC
  Filled 2020-01-23: qty 10000

## 2020-01-23 MED ORDER — EPHEDRINE SULFATE-NACL 50-0.9 MG/10ML-% IV SOSY
PREFILLED_SYRINGE | INTRAVENOUS | Status: DC | PRN
  Administered 2020-01-23 (×2): 10 mg via INTRAVENOUS

## 2020-01-23 MED ORDER — NITROGLYCERIN 0.4 MG SL SUBL: 0.4000 mg | SUBLINGUAL_TABLET | SUBLINGUAL | Status: DC | PRN

## 2020-01-23 MED ORDER — FENTANYL CITRATE (PF) 250 MCG/5ML IJ SOLN
INTRAMUSCULAR | Status: AC
  Filled 2020-01-23: qty 5

## 2020-01-23 MED ORDER — DEXAMETHASONE SODIUM PHOSPHATE 10 MG/ML IJ SOLN
INTRAMUSCULAR | Status: AC
  Filled 2020-01-23: qty 1

## 2020-01-23 MED ORDER — PROMETHAZINE HCL 25 MG PO TABS: 25.0000 mg | ORAL_TABLET | Freq: Four times a day (QID) | ORAL | Status: DC | PRN

## 2020-01-23 MED ORDER — ACETAMINOPHEN 650 MG RE SUPP: 650.0000 mg | RECTAL | Status: DC | PRN

## 2020-01-23 MED ORDER — BACLOFEN 10 MG PO TABS: 10.0000 mg | ORAL_TABLET | Freq: Two times a day (BID) | ORAL | Status: DC | PRN

## 2020-01-23 MED ORDER — EPHEDRINE SULFATE 50 MG/ML IJ SOLN: INTRAMUSCULAR | Status: DC | PRN

## 2020-01-23 MED ORDER — TIOTROPIUM BROMIDE MONOHYDRATE 1.25 MCG/ACT IN AERS: 2.0000 | INHALATION_SPRAY | Freq: Every morning | RESPIRATORY_TRACT | Status: DC

## 2020-01-23 MED ORDER — VITAMIN B-6 100 MG PO TABS
100.0000 mg | ORAL_TABLET | Freq: Every day | ORAL | Status: DC
  Administered 2020-01-23: 100 mg via ORAL
  Filled 2020-01-23: qty 1

## 2020-01-23 MED ORDER — FENTANYL CITRATE (PF) 250 MCG/5ML IJ SOLN
INTRAMUSCULAR | Status: DC | PRN
  Administered 2020-01-23: 50 ug via INTRAVENOUS
  Administered 2020-01-23: 100 ug via INTRAVENOUS

## 2020-01-23 MED ORDER — SODIUM CHLORIDE 0.9% FLUSH: 3.0000 mL | INTRAVENOUS | Status: DC | PRN

## 2020-01-23 MED ORDER — PROMETHAZINE HCL 25 MG/ML IJ SOLN: 6.2500 mg | INTRAMUSCULAR | Status: DC | PRN

## 2020-01-23 MED ORDER — DEXAMETHASONE SODIUM PHOSPHATE 10 MG/ML IJ SOLN
INTRAMUSCULAR | Status: DC | PRN
  Administered 2020-01-23: 5 mg via INTRAVENOUS

## 2020-01-23 SURGICAL SUPPLY — 61 items
APL SKNCLS STERI-STRIP NONHPOA (GAUZE/BANDAGES/DRESSINGS) ×1
BAG DECANTER FOR FLEXI CONT (MISCELLANEOUS) ×2 IMPLANT
BENZOIN TINCTURE PRP APPL 2/3 (GAUZE/BANDAGES/DRESSINGS) ×2 IMPLANT
BIT DRILL 13 (BIT) ×1 IMPLANT
BUR MATCHSTICK NEURO 3.0 LAGG (BURR) ×2 IMPLANT
CAGE PEEK 8X14X11 (Cage) ×1 IMPLANT
CANISTER SUCT 3000ML PPV (MISCELLANEOUS) ×2 IMPLANT
CARTRIDGE OIL MAESTRO DRILL (MISCELLANEOUS) ×1 IMPLANT
CLSR STERI-STRIP ANTIMIC 1/2X4 (GAUZE/BANDAGES/DRESSINGS) ×2 IMPLANT
COVER WAND RF STERILE (DRAPES) ×2 IMPLANT
DIFFUSER DRILL AIR PNEUMATIC (MISCELLANEOUS) ×2 IMPLANT
DRAPE C-ARM 42X72 X-RAY (DRAPES) ×4 IMPLANT
DRAPE LAPAROTOMY 100X72 PEDS (DRAPES) ×2 IMPLANT
DRAPE MICROSCOPE LEICA (MISCELLANEOUS) ×2 IMPLANT
DURAPREP 6ML APPLICATOR 50/CS (WOUND CARE) ×2 IMPLANT
ELECT COATED BLADE 2.86 ST (ELECTRODE) ×2 IMPLANT
ELECT REM PT RETURN 9FT ADLT (ELECTROSURGICAL) ×2
ELECTRODE REM PT RTRN 9FT ADLT (ELECTROSURGICAL) ×1 IMPLANT
GAUZE 4X4 16PLY RFD (DISPOSABLE) IMPLANT
GAUZE SPONGE 4X4 12PLY STRL (GAUZE/BANDAGES/DRESSINGS) ×2 IMPLANT
GAUZE SPONGE 4X4 12PLY STRL LF (GAUZE/BANDAGES/DRESSINGS) ×1 IMPLANT
GLOVE BIO SURGEON STRL SZ 6.5 (GLOVE) ×2 IMPLANT
GLOVE BIOGEL PI IND STRL 6.5 (GLOVE) ×1 IMPLANT
GLOVE BIOGEL PI IND STRL 7.0 (GLOVE) ×1 IMPLANT
GLOVE BIOGEL PI IND STRL 7.5 (GLOVE) IMPLANT
GLOVE BIOGEL PI INDICATOR 6.5 (GLOVE) ×1
GLOVE BIOGEL PI INDICATOR 7.0 (GLOVE) ×1
GLOVE BIOGEL PI INDICATOR 7.5 (GLOVE) ×1
GLOVE ECLIPSE 9.0 STRL (GLOVE) ×2 IMPLANT
GLOVE EXAM NITRILE XL STR (GLOVE) IMPLANT
GLOVE SURG SS PI 7.0 STRL IVOR (GLOVE) ×3 IMPLANT
GOWN STRL REUS W/ TWL LRG LVL3 (GOWN DISPOSABLE) IMPLANT
GOWN STRL REUS W/ TWL XL LVL3 (GOWN DISPOSABLE) ×2 IMPLANT
GOWN STRL REUS W/TWL 2XL LVL3 (GOWN DISPOSABLE) IMPLANT
GOWN STRL REUS W/TWL LRG LVL3 (GOWN DISPOSABLE) ×2
GOWN STRL REUS W/TWL XL LVL3 (GOWN DISPOSABLE) ×4
HALTER HD/CHIN CERV TRACTION D (MISCELLANEOUS) ×2 IMPLANT
HEMOSTAT POWDER KIT SURGIFOAM (HEMOSTASIS) IMPLANT
KIT BASIN OR (CUSTOM PROCEDURE TRAY) ×2 IMPLANT
KIT TURNOVER KIT B (KITS) ×2 IMPLANT
NDL SPNL 20GX3.5 QUINCKE YW (NEEDLE) ×1 IMPLANT
NEEDLE SPNL 20GX3.5 QUINCKE YW (NEEDLE) ×2 IMPLANT
NS IRRIG 1000ML POUR BTL (IV SOLUTION) ×2 IMPLANT
OIL CARTRIDGE MAESTRO DRILL (MISCELLANEOUS) ×2
PACK LAMINECTOMY NEURO (CUSTOM PROCEDURE TRAY) ×2 IMPLANT
PAD ARMBOARD 7.5X6 YLW CONV (MISCELLANEOUS) ×6 IMPLANT
PLATE ELITE VISION 25MM (Plate) ×2 IMPLANT
RUBBERBAND STERILE (MISCELLANEOUS) ×4 IMPLANT
SCREW ST 13X4XST VA NS SPNE (Screw) IMPLANT
SCREW ST VAR 4 ATL (Screw) ×8 IMPLANT
SPONGE INTESTINAL PEANUT (DISPOSABLE) ×2 IMPLANT
SPONGE SURGIFOAM ABS GEL SZ50 (HEMOSTASIS) ×2 IMPLANT
STRIP CLOSURE SKIN 1/2X4 (GAUZE/BANDAGES/DRESSINGS) ×2 IMPLANT
SUT VIC AB 3-0 SH 8-18 (SUTURE) ×2 IMPLANT
SUT VIC AB 4-0 RB1 18 (SUTURE) ×2 IMPLANT
TAPE CLOTH 4X10 WHT NS (GAUZE/BANDAGES/DRESSINGS) ×2 IMPLANT
TAPE CLOTH SURG 4X10 WHT LF (GAUZE/BANDAGES/DRESSINGS) ×1 IMPLANT
TOWEL GREEN STERILE (TOWEL DISPOSABLE) ×2 IMPLANT
TOWEL GREEN STERILE FF (TOWEL DISPOSABLE) ×2 IMPLANT
TRAP SPECIMEN MUCOUS 40CC (MISCELLANEOUS) ×2 IMPLANT
WATER STERILE IRR 1000ML POUR (IV SOLUTION) ×2 IMPLANT

## 2020-01-23 NOTE — H&P (Signed)
Cory Espinoza is an 49 y.o. male.   Chief Complaint: Neck pain HPI: 49 year old male with multiple severe medical problems presents with intractable neck pain with associated sensory loss and weakness.  Work-up demonstrates evidence of significant degeneration with degenerative anterolisthesis as well as broad-based disc protrusion and foraminal stenosis on the right at C6-7.  Patient has failed conservative management presents now for anterior cervical decompression and fusion in hopes of improving his symptoms.  Past Medical History:  Diagnosis Date  . Allergy   . Anxiety   . Arthritis    lumbar spine  . Atrial fibrillation (HCC)   . COPD (chronic obstructive pulmonary disease) (HCC)    Restrictive lung disease  . Coronary artery disease   . GERD (gastroesophageal reflux disease)   . Headache    [name], onset October 2020, cluster headaches in the past  . History of kidney stones   . Hyperlipidemia   . Hypertension   . Myocardial infarction (HCC)    at age 89  . Pneumonia   . Sleep apnea    unable to use cpap since onset of migraines    Past Surgical History:  Procedure Laterality Date  . BACK SURGERY  1996  . CARDIAC CATHETERIZATION Left 04/27/2016   Procedure: Left Heart Cath and Coronary Angiography;  Surgeon: Lamar Blinks, MD;  Location: [facility] INVASIVE CV LAB;  Service: Cardiovascular;  Laterality: Left;  . CARDIAC CATHETERIZATION N/A 04/27/2016   Procedure: Intravascular Pressure Wire/FFR Study;  Surgeon: Alwyn Pea, MD;  Location: [facility] INVASIVE CV LAB;  Service: Cardiovascular;  Laterality: N/A;  . CATHETERIZATION OF PULMONARY ARTERY WITH RETRIEVAL OF FOREIGN BODY Bilateral 04/07/2011   heart  . KNEE ARTHROSCOPY Bilateral 2004  . LUMBAR DISC SURGERY  1999    Family History  Problem Relation Age of Onset  . Heart disease Mother   . Heart disease Father   . Heart attack Father 73  . Diabetes Sister   . Hypertension Sister   . Hyperlipidemia  Sister   . Hypertension Brother   . Heart attack Brother 45  . Cancer Paternal Grandmother        breast  . Diabetes Paternal Grandmother   . Hyperlipidemia Brother   . Supraventricular tachycardia Brother   . Heart attack Paternal Grandfather 84   Social History:  reports that he quit smoking about 7 years ago. His smoking use included cigarettes. He started smoking about 30 years ago. He has a 46.00 pack-year smoking history. His smokeless tobacco use includes snuff. He reports current alcohol use. He reports that he does not use drugs.  Allergies:  Allergies  Allergen Reactions  . Aspirin Anaphylaxis  . Amoxicillin Hives and Itching    Has patient had a PCN reaction causing immediate rash, facial/tongue/throat swelling, SOB or lightheadedness with hypotension: No Has patient had a PCN reaction causing severe rash involving mucus membranes or skin necrosis: No Has patient had a PCN reaction that required hospitalization: No Has patient had a PCN reaction occurring within the last 10 years: No If all of the above answers are "NO", then may proceed with Cephalosporin use.   . Codeine Hives  . Penicillins Hives and Itching    Has patient had a PCN reaction causing immediate rash, facial/tongue/throat swelling, SOB or lightheadedness with hypotension: No Has patient had a PCN reaction causing severe rash involving mucus membranes or skin necrosis: No Has patient had a PCN reaction that required hospitalization: No Has patient had a PCN  reaction occurring within the last 10 years: No If all of the above answers are "NO", then may proceed with Cephalosporin use.   Marland Kitchen Hydrocodone-Acetaminophen Itching  . Oxycodone-Acetaminophen Nausea And Vomiting    Medications Prior to Admission  Medication Sig Dispense Refill  . AIMOVIG 70 MG/ML SOAJ Inject 70 mg into the skin every 28 (twenty-eight) days.    Marland Kitchen albuterol (PROVENTIL HFA;VENTOLIN HFA) 108 (90 Base) MCG/ACT inhaler Inhale 1-2 puffs  into the lungs every 4 (four) hours as needed for wheezing or shortness of breath. 1 Inhaler 5  . ALPRAZolam (XANAX) 1 MG tablet Take 1 mg by mouth 3 (three) times daily.    Marland Kitchen atorvastatin (LIPITOR) 40 MG tablet Take 1 tablet (40 mg total) by mouth at bedtime. 90 tablet 1  . B COMPLEX VITAMINS PO Take 1 tablet by mouth daily.     . baclofen (LIORESAL) 10 MG tablet Take 10 mg by mouth 2 (two) times daily as needed for muscle spasms.    . cetirizine (ZYRTEC) 10 MG tablet TAKE 1 TABLET(10 MG) BY MOUTH DAILY (Patient taking differently: Take 10 mg by mouth daily. ) 90 tablet 1  . Cholecalciferol (VITAMIN D) 125 MCG (5000 UT) CAPS Take 5,000 Units by mouth daily.    . clopidogrel (PLAVIX) 75 MG tablet Take 1 tablet (75 mg total) by mouth daily. 90 tablet 1  . escitalopram (LEXAPRO) 20 MG tablet Take 20 mg by mouth at bedtime.     . gabapentin (NEURONTIN) 300 MG capsule Take 600 mg by mouth at bedtime.    Marland Kitchen ketorolac (TORADOL) 10 MG tablet Take 10 mg by mouth every 6 (six) hours as needed for moderate pain.    Marland Kitchen MAGNESIUM CITRATE PO Take 3 capsules by mouth daily.    . Melatonin 10 MG TABS Take 30 mg by mouth at bedtime.    . NURTEC 75 MG TBDP Place 75 mg under the tongue daily as needed (migraine).     . pantoprazole (PROTONIX) 40 MG tablet TAKE 1 TABLET(40 MG) BY MOUTH DAILY (Patient taking differently: Take 40 mg by mouth daily. ) 30 tablet 10  . POTASSIUM PO Take 1 tablet by mouth daily.    . promethazine (PHENERGAN) 25 MG tablet Take 25 mg by mouth every 6 (six) hours as needed for nausea or vomiting.    . pyridOXINE (VITAMIN B-6) 100 MG tablet Take 100 mg by mouth at bedtime.    . Saw Palmetto 450 MG CAPS Take 900 mg by mouth daily.     . Tiotropium Bromide Monohydrate (SPIRIVA RESPIMAT) 1.25 MCG/ACT AERS Inhale 2 puffs into the lungs 2 (two) times daily. (Patient taking differently: Inhale 2 puffs into the lungs every morning. ) 1 Inhaler 5  . zonisamide (ZONEGRAN) 100 MG capsule Take 300 mg by  mouth at bedtime.    . benzonatate (TESSALON) 100 MG capsule TAKE 2 CAPSULES(200 MG) BY MOUTH THREE TIMES DAILY AS NEEDED FOR COUGH 90 capsule 1  . ipratropium-albuterol (DUONEB) 0.5-2.5 (3) MG/3ML SOLN Take 3 mLs by nebulization every 4 (four) hours as needed. DX: J44.9 COPD 360 mL 3  . nitroGLYCERIN (NITROSTAT) 0.4 MG SL tablet Place 0.4 mg under the tongue every 5 (five) minutes as needed for chest pain.      No results found for this or any previous visit (from the past 48 hour(s)). No results found.  Pertinent items noted in HPI and remainder of comprehensive ROS otherwise negative.  Blood pressure 115/68, pulse (!) 57,  temperature 97.8 F (36.6 C), temperature source Oral, resp. rate 18, height 6' (1.829 m), weight 97.1 kg, SpO2 97 %.  Patient is awake and alert.  He is oriented and appropriate.  Obviously quite uncomfortable.  Examination head ears eyes nose throat unremarkable chest and abdomen are recently benign.  Extremities are free from injury deformity.  Neurologically he is awake and alert.  He is oriented and appropriate.  Speech is fluent.  Judgment insight are intact.  Cranial nerve function normal bilateral.  Motor examination intact except his right triceps muscle group grading 5.  Sensory sensation decreased sensation pinprick and light touch in his right C7 dermatome.  Deep tender versus normal active.  No evidence for long track signs.  Gait is reasonably well.   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  Plan C6-7 anterior cervical decompression and fusion.  Risks and benefits of been explained.  Patient wishes to proceed.  Mallie Mussel A Juda Lajeunesse 01/23/2020, 11:21 AM

## 2020-01-23 NOTE — Anesthesia Postprocedure Evaluation (Signed)
Anesthesia Post Note  Patient: Cory Espinoza  Procedure(s) Performed: ANTERIOR CERVICAL DECOMPRESSION FUSION - CERVICAL SIX-CERVICAL SEVEN (N/A Spine Cervical)     Patient location during evaluation: PACU Anesthesia Type: General Level of consciousness: awake and alert Pain management: pain level controlled Vital Signs Assessment: post-procedure vital signs reviewed and stable Respiratory status: spontaneous breathing, nonlabored ventilation, respiratory function stable and patient connected to nasal cannula oxygen Cardiovascular status: blood pressure returned to baseline and stable Postop Assessment: no apparent nausea or vomiting Anesthetic complications: no    Last Vitals:  Vitals:   01/23/20 1424 01/23/20 1443  BP: 118/71 124/79  Pulse: 71 68  Resp: 12 18  Temp: 36.7 C 36.6 C  SpO2: 93% 94%    Last Pain:  Vitals:   01/23/20 1443  TempSrc: Oral  PainSc:                  Arial Galligan DANIEL

## 2020-01-23 NOTE — Op Note (Signed)
Date of procedure: 01/23/2020  Date of dictation: Same  Service: Neurosurgery  Preoperative diagnosis: Right C6-7 herniated nucleus pulposus with radiculopathy  Postoperative diagnosis: Same  Procedure Name: C6-7 anterior cervical discectomy and interbody fusion utilizing interbody peek cage, locally harvested autograft, and anterior plate instrumentation  Surgeon:Labrisha Wuellner A.Khilynn Borntreger, M.D.  Asst. Surgeon: Doran Durand, NP  Anesthesia: General  Indication: 49 year old male with severe neck and right upper extremity pain failing conservative management.  With obvious right-sided of a broad-based disc herniation extending to the right C7 neuroforamen.  Patient is failed conservative management.  Presents now for C6-7 anterior cervical discectomy and fusion in hopes of improving his symptoms.  Operative note: After induction of anesthesia, patient positioned supine with neck slightly extended and held placed on traction.  Patient's anterior cervical region prepped and draped sterilely.  Incision made overlying C6-7.  Dissection performed on the right.  Retractor placed.  Fluoroscopy used.  Levels confirmed.  Discectomy then performed using various instruments down to level the posterior annulus.  Microscope was brought to field using microdissection spinal canal.  Remaining aspects of annulus and osteophytes removed using high-speed drill without solid posterior logical limb.  Posterior logical was elevated and resected using Kerrison rongeurs.  Wide central decompression then performed undercutting the bodies of C6 and C7.  Decompression then proceeded neural foramen.  Wide anterior prolonged course exiting nerve roots bilaterally.  This point a very thorough discectomy was achieved.  There was evidence of injury to thecal sac or nerve roots.  Wound was then irrigated with antibiotic solution.  Gelfoam placed topically with a stent (8 mm Medtronic peek cage packed with locally harvested autograft and impacted in  place process slightly from anterior of C6 and C7.  25 mm Atlantis anterior cervical nodes were placed in the C6 and C7 levels. Tension fluoroscopic guidance to 13 mm variable angle screws to each of both levels.  All 4 screws given final tightening found to be solid component.  Locking screws.  Final images reveal position of cages at both levels and alignment of spine.  Wound is benign.  Hemostasis layers of Vicryl sutures.  Steri-Strips and sterile dressing were applied.  No apparent palpitations.  Patient tolerated the procedure well he returns to recovery and postoperative

## 2020-01-23 NOTE — Transfer of Care (Signed)
Immediate Anesthesia Transfer of Care Note  Patient: Cory Espinoza  Procedure(s) Performed: ANTERIOR CERVICAL DECOMPRESSION FUSION - CERVICAL SIX-CERVICAL SEVEN (N/A Spine Cervical)  Patient Location: PACU  Anesthesia Type:General  Level of Consciousness: awake and alert   Airway & Oxygen Therapy: Patient Spontanous Breathing and Patient connected to face mask oxygen  Post-op Assessment: Report given to RN and Post -op Vital signs reviewed and stable  Post vital signs: Reviewed and stable  Last Vitals:  Vitals Value Taken Time  BP 123/77 01/23/20 1306  Temp    Pulse 79 01/23/20 1306  Resp 19 01/23/20 1306  SpO2 94 % 01/23/20 1306  Vitals shown include unvalidated device data.  Last Pain:  Vitals:   01/23/20 1010  TempSrc:   PainSc: 10-Worst pain ever      Patients Stated Pain Goal: 4 (01/23/20 1010)  Complications: No apparent anesthesia complications

## 2020-01-23 NOTE — Progress Notes (Signed)
PHARMACIST - PHYSICIAN ORDER COMMUNICATION  CONCERNING: P&T Medication Policy on Herbal Medications  DESCRIPTION:  This patient's orders for: Potassium 99 mg (2.5 mEq), Saw palmetto have been noted.  This product(s) is classified as an "herbal" or natural product. Due to a lack of definitive safety studies or FDA approval, nonstandard manufacturing practices, plus the potential risk of unknown drug-drug interactions while on inpatient medications, the Pharmacy and Therapeutics Committee does not permit the use of "herbal" or natural products of this type within [facility].   ACTION TAKEN: The pharmacy department is unable to verify this order at this time and your patient has been informed of this safety policy. Please reevaluate patient's clinical condition at discharge and address if the herbal or natural product(s) should be resumed at that time.  Vicki Mallet, PharmD, BCPS, [facility] Clinical Pharmacist

## 2020-01-23 NOTE — Brief Op Note (Signed)
01/23/2020  12:55 PM  PATIENT:  Cory Espinoza  49 y.o. male  PRE-OPERATIVE DIAGNOSIS:  Stenosis  POST-OPERATIVE DIAGNOSIS:  Stenosis  PROCEDURE:  Procedure(s): ANTERIOR CERVICAL DECOMPRESSION FUSION - CERVICAL SIX-CERVICAL SEVEN (N/A)  SURGEON:  Surgeon(s) and Role:    * Julio Sicks, MD - Primary  PHYSICIAN ASSISTANT:   ASSISTANTSMarland Mcalpine   ANESTHESIA:   general  EBL:  Minimal  BLOOD ADMINISTERED:none  DRAINS: none   LOCAL MEDICATIONS USED:  MARCAINE     SPECIMEN:  No Specimen  DISPOSITION OF SPECIMEN:  N/A  COUNTS:  YES  TOURNIQUET:  * No tourniquets in log *  DICTATION: .Dragon Dictation  PLAN OF CARE: Admit for overnight observation  PATIENT DISPOSITION:  PACU - hemodynamically stable.   Delay start of Pharmacological VTE agent (>24hrs) due to surgical blood loss or risk of bleeding: yes

## 2020-01-23 NOTE — Anesthesia Procedure Notes (Signed)
Procedure Name: Intubation Date/Time: 01/23/2020 11:42 AM Performed by: Modena Morrow, CRNA Pre-anesthesia Checklist: Patient identified, Emergency Drugs available, Suction available and Patient being monitored Patient Re-evaluated:Patient Re-evaluated prior to induction Oxygen Delivery Method: Circle System Utilized Preoxygenation: Pre-oxygenation with 100% oxygen Induction Type: IV induction Ventilation: Mask ventilation without difficulty Laryngoscope Size: Miller and 3 Grade View: Grade I Tube type: Oral Tube size: 7.5 mm Number of attempts: 1 Airway Equipment and Method: Stylet and Oral airway Placement Confirmation: ETT inserted through vocal cords under direct vision,  positive ETCO2 and breath sounds checked- equal and bilateral Secured at: 22 cm Tube secured with: Tape Dental Injury: Teeth and Oropharynx as per pre-operative assessment

## 2020-01-24 ENCOUNTER — Encounter: Payer: Self-pay | Admitting: *Deleted

## 2020-01-24 DIAGNOSIS — M50123 Cervical disc disorder at C6-C7 level with radiculopathy: Secondary | ICD-10-CM | POA: Diagnosis not present

## 2020-01-24 MED ORDER — OXYCODONE-ACETAMINOPHEN 5-325 MG PO TABS: 1.0000 | ORAL_TABLET | ORAL | 0 refills | Status: DC | PRN

## 2020-01-24 NOTE — Discharge Summary (Signed)
Physician Discharge Summary  Patient ID: Cory Espinoza MRN: [id number] DOB/AGE: [birth year] 49 y.o.  Admit date: 01/23/2020 Discharge date: 01/24/2020  Admission Diagnoses:  Discharge Diagnoses:  Active Problems:   Herniated disc, cervical   Cervical spondylosis with myelopathy and radiculopathy   Discharged Condition: good  Hospital Course: Patient admitted to the hospital where he underwent uncomplicated W0-9 anterior cervical decompression and fusion.  Postoperatively doing well.  Preoperative neck and upper extremity symptoms much improved.  Standing ambulating and voiding without difficulty.  Ready for discharge home.  Consults:   Significant Diagnostic Studies:   Treatments:   Discharge Exam: Blood pressure 105/71, pulse 60, temperature 97.8 F (36.6 C), temperature source Oral, resp. rate 16, height 6' (1.829 m), weight 97.1 kg, SpO2 94 %. Awake and alert.  Oriented and appropriate.  Breathing easily.  Chest and abdomen benign.  Motor and sensory function intact.  Wound clean and dry.  Neck soft.  Disposition: Discharge disposition: 01-Home or Self Care       Discharge Instructions    Incentive spirometry RT   Complete by: As directed      Allergies as of 01/24/2020      Reactions   Aspirin Anaphylaxis   Amoxicillin Hives, Itching   Has patient had a PCN reaction causing immediate rash, facial/tongue/throat swelling, SOB or lightheadedness with hypotension: No Has patient had a PCN reaction causing severe rash involving mucus membranes or skin necrosis: No Has patient had a PCN reaction that required hospitalization: No Has patient had a PCN reaction occurring within the last 10 years: No If all of the above answers are "NO", then may proceed with Cephalosporin use.   Codeine Hives   Penicillins Hives, Itching   Has patient had a PCN reaction causing immediate rash, facial/tongue/throat swelling, SOB or lightheadedness with hypotension: No Has patient had a  PCN reaction causing severe rash involving mucus membranes or skin necrosis: No Has patient had a PCN reaction that required hospitalization: No Has patient had a PCN reaction occurring within the last 10 years: No If all of the above answers are "NO", then may proceed with Cephalosporin use.   Hydrocodone-acetaminophen Itching   Oxycodone-acetaminophen Nausea And Vomiting      Medication List    TAKE these medications   Aimovig 70 MG/ML Soaj Generic drug: Erenumab-aooe Inject 70 mg into the skin every 28 (twenty-eight) days.   albuterol 108 (90 Base) MCG/ACT inhaler Commonly known as: VENTOLIN HFA Inhale 1-2 puffs into the lungs every 4 (four) hours as needed for wheezing or shortness of breath.   ALPRAZolam 1 MG tablet Commonly known as: XANAX Take 1 mg by mouth 3 (three) times daily.   atorvastatin 40 MG tablet Commonly known as: LIPITOR Take 1 tablet (40 mg total) by mouth at bedtime.   B COMPLEX VITAMINS PO Take 1 tablet by mouth daily.   baclofen 10 MG tablet Commonly known as: LIORESAL Take 10 mg by mouth 2 (two) times daily as needed for muscle spasms.   benzonatate 100 MG capsule Commonly known as: TESSALON TAKE 2 CAPSULES(200 MG) BY MOUTH THREE TIMES DAILY AS NEEDED FOR COUGH   cetirizine 10 MG tablet Commonly known as: ZYRTEC TAKE 1 TABLET(10 MG) BY MOUTH DAILY What changed: See the new instructions.   clopidogrel 75 MG tablet Commonly known as: PLAVIX Take 1 tablet (75 mg total) by mouth daily.   escitalopram 20 MG tablet Commonly known as: LEXAPRO Take 20 mg by mouth at bedtime.   gabapentin  300 MG capsule Commonly known as: NEURONTIN Take 600 mg by mouth at bedtime.   ipratropium-albuterol 0.5-2.5 (3) MG/3ML Soln Commonly known as: DUONEB Take 3 mLs by nebulization every 4 (four) hours as needed. DX: J44.9 COPD   ketorolac 10 MG tablet Commonly known as: TORADOL Take 10 mg by mouth every 6 (six) hours as needed for moderate pain.    MAGNESIUM CITRATE PO Take 3 capsules by mouth daily.   Melatonin 10 MG Tabs Take 30 mg by mouth at bedtime.   nitroGLYCERIN 0.4 MG SL tablet Commonly known as: NITROSTAT Place 0.4 mg under the tongue every 5 (five) minutes as needed for chest pain.   Nurtec 75 MG Tbdp Generic drug: Rimegepant Sulfate Place 75 mg under the tongue daily as needed (migraine).   oxyCODONE-acetaminophen 5-325 MG tablet Commonly known as: PERCOCET/ROXICET Take 1-2 tablets by mouth every 4 (four) hours as needed for severe pain.   pantoprazole 40 MG tablet Commonly known as: PROTONIX TAKE 1 TABLET(40 MG) BY MOUTH DAILY What changed: See the new instructions.   POTASSIUM PO Take 1 tablet by mouth daily.   promethazine 25 MG tablet Commonly known as: PHENERGAN Take 25 mg by mouth every 6 (six) hours as needed for nausea or vomiting.   pyridOXINE 100 MG tablet Commonly known as: VITAMIN B-6 Take 100 mg by mouth at bedtime.   Saw Palmetto 450 MG Caps Take 900 mg by mouth daily.   Tiotropium Bromide Monohydrate 1.25 MCG/ACT Aers Commonly known as: Spiriva Respimat Inhale 2 puffs into the lungs 2 (two) times daily. What changed: when to take this   Vitamin D 125 MCG (5000 UT) Caps Take 5,000 Units by mouth daily.   zonisamide 100 MG capsule Commonly known as: ZONEGRAN Take 300 mg by mouth at bedtime.        Signed: Kathaleen Maser Wilberth Damon 01/24/2020, 8:46 AM

## 2020-01-24 NOTE — Discharge Instructions (Signed)

## 2020-01-24 NOTE — Evaluation (Signed)
Physical Therapy Evaluation Patient Details Name: Cory Espinoza MRN: [id number] DOB: 04-27-1971 Today's Date: 01/24/2020   History of Present Illness  Pt is a 49 y/o male s/p C6-7 ACDF. PMH: anxiety, arthritis, afib, COPD, HTN, MI, PNA, sleep apnea.   Clinical Impression  Pt admitted with above diagnosis. At the time of PT eval, pt was able to demonstrate transfers and ambulation with gross min guard assist to min assist with RW for support. Pt demonstrating several posterior losses of balance in sitting and standing. Pt reporting concerns for vestibular involvement and reports that his neurologist recommended vestibular PT for BPPV. Recommend follow-up PT at the outpatient level to address this per neurosurgeon's discretion as pt will likely be limited with testing/treatment due to current cervical precautions. Pt was educated on precautions, brace application/wearing schedule, appropriate activity progression, and car transfer. Pt currently with functional limitations due to the deficits listed below (see PT Problem List). Pt will benefit from skilled PT to increase their independence and safety with mobility to allow discharge to the venue listed below.      Follow Up Recommendations No PT follow up;Supervision for mobility/OOB    Equipment Recommendations  Rolling walker with 5" wheels    Recommendations for Other Services       Precautions / Restrictions Precautions Precautions: Cervical Precaution Booklet Issued: Yes (comment) Precaution Comments: reviewed precautions with pt  Required Braces or Orthoses: Cervical Brace Cervical Brace: Soft collar;At all times Restrictions Weight Bearing Restrictions: No      Mobility  Bed Mobility Overal bed mobility: Needs Assistance Bed Mobility: Rolling;Sidelying to Sit;Sit to Sidelying Rolling: Min guard Sidelying to sit: Min guard     Sit to sidelying: Min guard General bed mobility comments: min guard to ensure log roll technique    Transfers Overall transfer level: Needs assistance Equipment used: Rolling walker (2 wheeled) Transfers: Sit to/from Stand Sit to Stand: Min assist         General transfer comment: min assist to power up and steady  Ambulation/Gait Ambulation/Gait assistance: Min assist Gait Distance (Feet): 200 Feet Assistive device: Rolling walker (2 wheeled) Gait Pattern/deviations: Step-through pattern;Decreased stride length;Trunk flexed;Leaning posteriorly Gait velocity: Decreased Gait velocity interpretation: <1.8 ft/sec, indicate of risk for recurrent falls General Gait Details: With posture correction pt with several losses of balance posteriorly. Min assist to correct and prevent falls.   Stairs            Wheelchair Mobility    Modified Rankin (Stroke Patients Only)       Balance Overall balance assessment: Needs assistance Sitting-balance support: No upper extremity supported;Feet supported Sitting balance-Leahy Scale: Fair     Standing balance support: Bilateral upper extremity supported;During functional activity Standing balance-Leahy Scale: Poor Standing balance comment: pt relaint on BUE support dynamically                             Pertinent Vitals/Pain Pain Assessment: Faces Faces Pain Scale: Hurts a little bit Pain Location: throat Pain Descriptors / Indicators: Discomfort Pain Intervention(s): Limited activity within patient's tolerance;Monitored during session;Repositioned    Home Living Family/patient expects to be discharged to:: Private residence Living Arrangements: Spouse/significant other;Children;Other relatives Available Help at Discharge: Family;Available 24 hours/day Type of Home: House       Home Layout: One level Home Equipment: Shower seat - built in      Prior Function Level of Independence: Needs assistance   Gait / Transfers Assistance Needed:  patient reports independent with mobility   ADL's / Homemaking  Assistance Needed: reports some assist for ADLs and spouse completes iADLs   Comments: pt limited historian due to impaired cognition     Hand Dominance   Dominant Hand: Right    Extremity/Trunk Assessment   Upper Extremity Assessment Upper Extremity Assessment: RUE deficits/detail;Defer to OT evaluation RUE Deficits / Details: grossly 3/5MMT (compared to 4+ on L side)-within cervical precautions  RUE Sensation: WNL RUE Coordination: WNL    Lower Extremity Assessment Lower Extremity Assessment: Generalized weakness    Cervical / Trunk Assessment Cervical / Trunk Assessment: Other exceptions Cervical / Trunk Exceptions: s/p surgery  Communication   Communication: No difficulties  Cognition Arousal/Alertness: Awake/alert Behavior During Therapy: WFL for tasks assessed/performed Overall Cognitive Status: No family/caregiver present to determine baseline cognitive functioning Area of Impairment: Orientation;Attention;Memory;Following commands;Safety/judgement;Awareness;Problem solving                 Orientation Level: Disoriented to;Time Current Attention Level: Sustained Memory: Decreased short-term memory;Decreased recall of precautions Following Commands: Follows one step commands consistently;Follows one step commands with increased time;Follows multi-step commands inconsistently Safety/Judgement: Decreased awareness of deficits;Decreased awareness of safety Awareness: Intellectual Problem Solving: Slow processing;Decreased initiation;Difficulty sequencing;Requires verbal cues General Comments: SBT completed with patient scoring 17/28 (significant impairments)-- patient reports having "cognitive tests" done recently and difficulty with STM, attending and problem solving       General Comments      Exercises Other Exercises Other Exercises: provided soft theraputty and provided exercises for R UE strength    Assessment/Plan    PT Assessment Patient needs  continued PT services  PT Problem List Decreased strength;Decreased activity tolerance;Decreased mobility;Decreased balance;Decreased knowledge of use of DME;Decreased safety awareness;Decreased knowledge of precautions;Pain       PT Treatment Interventions DME instruction;Gait training;Functional mobility training;Therapeutic activities;Therapeutic exercise;Neuromuscular re-education;Patient/family education    PT Goals (Current goals can be found in the Care Plan section)  Acute Rehab PT Goals Patient Stated Goal: to get home  PT Goal Formulation: With patient Time For Goal Achievement: 01/31/20 Potential to Achieve Goals: Good    Frequency Min 5X/week   Barriers to discharge        Co-evaluation               AM-PAC PT "6 Clicks" Mobility  Outcome Measure Help needed turning from your back to your side while in a flat bed without using bedrails?: None Help needed moving from lying on your back to sitting on the side of a flat bed without using bedrails?: A Little Help needed moving to and from a bed to a chair (including a wheelchair)?: A Little Help needed standing up from a chair using your arms (e.g., wheelchair or bedside chair)?: A Little Help needed to walk in hospital room?: A Little Help needed climbing 3-5 steps with a railing? : A Little 6 Click Score: 19    End of Session Equipment Utilized During Treatment: Gait belt Activity Tolerance: Patient tolerated treatment well Patient left: in bed;with call bell/phone within reach Nurse Communication: Mobility status PT Visit Diagnosis: Unsteadiness on feet (R26.81);Pain;Difficulty in walking, not elsewhere classified (R26.2) Pain - part of body: (neck)    Time: 1497-0263 PT Time Calculation (min) (ACUTE ONLY): 25 min   Charges:   PT Evaluation $PT Eval Moderate Complexity: 1 Mod PT Treatments $Gait Training: 8-22 mins        Conni Slipper, PT, DPT Acute Rehabilitation Services Pager:  [phone] Office: [phone]  Marylynn Pearson 01/24/2020, 3:14 PM

## 2020-01-24 NOTE — Plan of Care (Signed)
Pt doing well. Pt and wife given D/C instructions with verbal understanding. Rx was sent to pharmacy by MD. Pt's incision is clean and dry with no sign of infection. Pt's IV was removed prior to D/C. Pt D/C'd home via wheelchair per MD order. Pt received RW and 3-n-1 from [employer] per MD order. Pt is stable @ D/C and has no other needs at this time. Rema Fendt, RN

## 2020-01-24 NOTE — Evaluation (Signed)
Occupational Therapy Evaluation Patient Details Name: Cory Espinoza MRN: [id number] DOB: 08/31/1971 Today's Date: 01/24/2020    History of Present Illness Pt is a 49 y/o male s/p C6-7 ACDF. PMH: anxiety, arthritis, afib, COPD, HTN, MI, PNA, sleep apnea.    Clinical Impression   Patient admitted for above and limited by problem list below, including impaired cognition, R UE weakness, impaired balance and decreased activity tolerance.  Patient currently requires min assist for ADLs, transfers and in room mobility using RW.  Educated on cervical precautions, brace mgmt and wear schedule, ADL compensatory techniques, exercises using theraputty to R UE, and safety.  He will have 24/7 support at dc from spouse.  Patient reports recent deficits in cognition, and seen through short blessed test scoring 17/28 (significant impairment), with poor attention, sequencing, memory, and slow processing.  Patient will benefit from further OT services acutely and after dc at OP OT level to address R UE strength and cognition.  Will follow.     Follow Up Recommendations  Outpatient OT;Supervision/Assistance - 24 hour    Equipment Recommendations  3 in 1 bedside commode    Recommendations for Other Services       Precautions / Restrictions Precautions Precautions: Cervical Precaution Booklet Issued: Yes (comment) Precaution Comments: reviewed precautions with pt  Required Braces or Orthoses: Cervical Brace Cervical Brace: Soft collar;At all times Restrictions Weight Bearing Restrictions: No      Mobility Bed Mobility Overal bed mobility: Needs Assistance Bed Mobility: Rolling;Sidelying to Sit;Sit to Sidelying Rolling: Min guard Sidelying to sit: Min guard     Sit to sidelying: Min guard General bed mobility comments: min guard to ensure log roll technique   Transfers Overall transfer level: Needs assistance Equipment used: Rolling walker (2 wheeled) Transfers: Sit to/from Stand Sit to  Stand: Min assist         General transfer comment: min assist to power up and steady    Balance Overall balance assessment: Needs assistance Sitting-balance support: No upper extremity supported;Feet supported Sitting balance-Leahy Scale: Fair     Standing balance support: Bilateral upper extremity supported;During functional activity Standing balance-Leahy Scale: Poor Standing balance comment: pt relaint on BUE support dynamically                           ADL either performed or assessed with clinical judgement   ADL Overall ADL's : Needs assistance/impaired     Grooming: Minimal assistance;Sitting   Upper Body Bathing: Minimal assistance;Sitting   Lower Body Bathing: Sit to/from stand;Minimal assistance   Upper Body Dressing : Minimal assistance;Sitting   Lower Body Dressing: Minimal assistance;Sit to/from stand Lower Body Dressing Details (indicate cue type and reason): able to figure 4 technique but requires increased time and min assist sit to stand  Toilet Transfer: Minimal assistance;Ambulation;RW           Functional mobility during ADLs: Minimal assistance;Rolling walker General ADL Comments: patient limited by R UE weakness, impaired balance and impaired cognition      Vision         Perception     Praxis      Pertinent Vitals/Pain Pain Assessment: Faces Faces Pain Scale: Hurts a little bit Pain Location: throat Pain Descriptors / Indicators: Discomfort Pain Intervention(s): Monitored during session;Repositioned;Limited activity within patient's tolerance     Hand Dominance Right   Extremity/Trunk Assessment Upper Extremity Assessment Upper Extremity Assessment: RUE deficits/detail RUE Deficits / Details: grossly 3/5MMT (compared to 4+  on L side)-within cervical precautions  RUE Sensation: WNL RUE Coordination: WNL   Lower Extremity Assessment Lower Extremity Assessment: Defer to PT evaluation       Communication  Communication Communication: No difficulties   Cognition Arousal/Alertness: Awake/alert Behavior During Therapy: WFL for tasks assessed/performed Overall Cognitive Status: No family/caregiver present to determine baseline cognitive functioning Area of Impairment: Orientation;Attention;Memory;Following commands;Safety/judgement;Awareness;Problem solving                 Orientation Level: Disoriented to;Time Current Attention Level: Sustained Memory: Decreased short-term memory;Decreased recall of precautions Following Commands: Follows one step commands consistently;Follows one step commands with increased time;Follows multi-step commands inconsistently Safety/Judgement: Decreased awareness of deficits;Decreased awareness of safety Awareness: Intellectual Problem Solving: Slow processing;Decreased initiation;Difficulty sequencing;Requires verbal cues General Comments: SBT completed with patient scoring 17/28 (significant impairments)-- patient reports having "cognitive tests" done recently and difficulty with STM, attending and problem solving    General Comments       Exercises Exercises: Other exercises Other Exercises Other Exercises: provided soft theraputty and provided exercises for R UE strength    Shoulder Instructions      Home Living Family/patient expects to be discharged to:: Private residence Living Arrangements: Spouse/significant other;Children;Other relatives Available Help at Discharge: Family;Available 24 hours/day Type of Home: House       Home Layout: One level     Bathroom Shower/Tub: Producer, television/film/video: Standard     Home Equipment: None          Prior Functioning/Environment Level of Independence: Needs assistance  Gait / Transfers Assistance Needed: patient reports independent with mobility  ADL's / Homemaking Assistance Needed: reports some assist for ADLs and spouse completes iADLs    Comments: pt limited historian due  to impaired cognition        OT Problem List: Decreased strength;Impaired balance (sitting and/or standing);Decreased activity tolerance;Pain;Decreased knowledge of precautions;Decreased knowledge of use of DME or AE;Decreased cognition;Decreased safety awareness      OT Treatment/Interventions: Self-care/ADL training;Therapeutic exercise;Therapeutic activities;Balance training;Patient/family education;Cognitive remediation/compensation;DME and/or AE instruction    OT Goals(Current goals can be found in the care plan section) Acute Rehab OT Goals Patient Stated Goal: to get home  OT Goal Formulation: With patient Time For Goal Achievement: 02/07/20 Potential to Achieve Goals: Good  OT Frequency: Min 2X/week   Barriers to D/C:            Co-evaluation              AM-PAC OT "6 Clicks" Daily Activity     Outcome Measure Help from another person eating meals?: A Little Help from another person taking care of personal grooming?: A Little Help from another person toileting, which includes using toliet, bedpan, or urinal?: A Little Help from another person bathing (including washing, rinsing, drying)?: A Little Help from another person to put on and taking off regular upper body clothing?: A Little Help from another person to put on and taking off regular lower body clothing?: A Little 6 Click Score: 18   End of Session Equipment Utilized During Treatment: Gait belt;Rolling walker;Cervical collar Nurse Communication: Mobility status(dc recommendations)  Activity Tolerance: Patient tolerated treatment well Patient left: with call bell/phone within reach;in bed  OT Visit Diagnosis: Other abnormalities of gait and mobility (R26.89);Muscle weakness (generalized) (M62.81);Other symptoms and signs involving cognitive function                Time: 2876-8115 OT Time Calculation (min): 22 min Charges:  OT General Charges $  OT Visit: 1 Visit OT Evaluation $OT Eval Moderate  Complexity: 1 Mod  Barry Brunner, OT Acute Rehabilitation Services Pager [phone] Office [phone]   Chancy Milroy 01/24/2020, 1:51 PM

## 2020-02-05 ENCOUNTER — Other Ambulatory Visit: Payer: Self-pay

## 2020-02-05 ENCOUNTER — Ambulatory Visit (INDEPENDENT_AMBULATORY_CARE_PROVIDER_SITE_OTHER): Payer: BC Managed Care – PPO | Admitting: Pulmonary Disease

## 2020-02-05 ENCOUNTER — Encounter: Payer: Self-pay | Admitting: Pulmonary Disease

## 2020-02-05 DIAGNOSIS — G4733 Obstructive sleep apnea (adult) (pediatric): Secondary | ICD-10-CM

## 2020-02-05 DIAGNOSIS — R0602 Shortness of breath: Secondary | ICD-10-CM

## 2020-02-05 DIAGNOSIS — Z87891 Personal history of nicotine dependence: Secondary | ICD-10-CM

## 2020-02-05 DIAGNOSIS — R918 Other nonspecific abnormal finding of lung field: Secondary | ICD-10-CM | POA: Diagnosis not present

## 2020-02-05 DIAGNOSIS — J449 Chronic obstructive pulmonary disease, unspecified: Secondary | ICD-10-CM

## 2020-02-05 DIAGNOSIS — Z720 Tobacco use: Secondary | ICD-10-CM | POA: Diagnosis not present

## 2020-02-05 MED ORDER — BENZONATATE 200 MG PO CAPS: 200.0000 mg | ORAL_CAPSULE | Freq: Three times a day (TID) | ORAL | 2 refills | Status: AC | PRN

## 2020-02-05 MED ORDER — IPRATROPIUM-ALBUTEROL 0.5-2.5 (3) MG/3ML IN SOLN: 3.0000 mL | RESPIRATORY_TRACT | 3 refills | Status: AC | PRN

## 2020-02-05 NOTE — Progress Notes (Signed)
Virtual Visit via Telephone Note  I connected with Cory Espinoza on 02/05/20 at  3:30 PM EST by telephone and verified that I am speaking with the correct person using two identifiers.  Location: Patient: Home Provider: Office [facility] - [address], [address], [place], [place] [address]   I discussed the limitations, risks, security and privacy concerns of performing an evaluation and management service by telephone and the availability of in person appointments. I also discussed with the patient that there may be a patient responsible charge related to this service. The patient expressed understanding and agreed to proceed.  Patient consented to consult via telephone: Yes People present and their role in pt care: Pt     History of Present Illness:  49 year old male former smoker/current smokeless tobacco user followed in our office for COPD  Past medical history: Hyperlipidemia, anxiety, obesity, GERD hypertension, status post C6-7 cervical fusion Smoking History: Former Smoker. Quit 2013. 46 pack years. Current smokeless user.  Maintenance: Spiriva Resp 1.25 Pt of: Dr. Belia Heman  Chief complaint: Cough, short of breath, recent neck surgery   49 year old former smoker/current smokeless tobacco user completing televisit with our office today.  Patient with restrictive lung disease.  Maintained on Spiriva Respimat 1.25.  Patient recently had a neck surgery with Dr. Jordan Likes on 01/23/2020.  Since the neck surgery has noticed that his breathing is worsening, increased dry cough.  He is planning on having another surgery-back surgery with Dr. Jordan Likes in 3 weeks later on this month.  He wants to be feeling better by then.  Patient is not maintained on any blood thinners.  He never received an incentive spirometer after his neck surgery.  He denies fevers, no chest pain, no heart racing.  He does have chest tightness with his cough.  Is occasionally productive.  He occasionally wheezes.   His cough is improved when he takes his [employer].  He also needs refills of his nebulized medications.  Last CT axial imaging was March/2019.  Observations/Objective:  PFT  12/2017- Small airways obstruction with restrictive lung disease from increased weight FEF 25/75 reduced to 66% predicted Fev1/FVC ratio WNL FVC reduced TLC reduced DLCO NL when corrected for volumes  Imaging: CT chest 3.29.19- persistent b/l infiltrates R>L BUT Much improved since last CT scan   Social History   Tobacco Use  Smoking Status Former Smoker  . Packs/day: 2.00  . Years: 23.00  . Pack years: 46.00  . Types: Cigarettes  . Start date: 10/28/1989  . Quit date: 10/28/2012  . Years since quitting: 7.2  Smokeless Tobacco Current User  . Types: Snuff   Immunization History  Administered Date(s) Administered  . Influenza, Seasonal, Injecte, Preservative Fre 10/07/2013  . Influenza,inj,Quad PF,6+ Mos 10/19/2014  . Influenza-Unspecified 08/28/2015, 09/24/2018, 08/31/2019    Assessment and Plan:  Chronic obstructive pulmonary disease (HCC) Plan: Continue Spiriva Respimat 1.25 Chest x-ray today Lab work today May need to consider course of steroids/antibiotics after we receive the lab results as well as x-ray  OSA (obstructive sleep apnea) Plan: We will clinically monitor you Need to further discuss at next visit obstructive sleep apnea  Abnormal findings on diagnostic imaging of lung March/2019 CT chest showed improved aeration in bases, may need to consider repeat CT imaging per radiology recommendation ?  Bronchiectasis  Plan: Chest x-ray today We will have low threshold for ordering CT imaging to further evaluate lungs, would favor high-resolution CT chest  Current tobacco use Current smokeless tobacco  user  Plan: We recommend that you do not use smokeless tobacco  Former smoker Plan: Continue to not smoke cigarettes   Follow Up Instructions:  Return in about 1  week (around 02/12/2020), or if symptoms worsen or fail to improve, for Follow up with Wyn Quaker FNP-C.   I discussed the assessment and treatment plan with the patient. The patient was provided an opportunity to ask questions and all were answered. The patient agreed with the plan and demonstrated an understanding of the instructions.   The patient was advised to call back or seek an in-person evaluation if the symptoms worsen or if the condition fails to improve as anticipated.  I provided 24 minutes of non-face-to-face time during this encounter.   Lauraine Rinne, NP

## 2020-02-05 NOTE — Assessment & Plan Note (Signed)
Plan: Continue Spiriva Respimat 1.25 Chest x-ray today Lab work today May need to consider course of steroids/antibiotics after we receive the lab results as well as x-ray

## 2020-02-05 NOTE — Telephone Encounter (Signed)
DK please advise?

## 2020-02-05 NOTE — Assessment & Plan Note (Signed)
March/2019 CT chest showed improved aeration in bases, may need to consider repeat CT imaging per radiology recommendation ?  Bronchiectasis  Plan: Chest x-ray today We will have low threshold for ordering CT imaging to further evaluate lungs, would favor high-resolution CT chest

## 2020-02-05 NOTE — Assessment & Plan Note (Signed)
Plan: Continue to not smoke cigarettes 

## 2020-02-05 NOTE — Patient Instructions (Addendum)
You were seen today by Coral Ceo, NP  for:   It was a pleasure talking with you today.  I am concerned with your acute symptoms.  It is difficult to fully evaluate these over the phone.  I have ordered an x-ray to further evaluate your lungs.  I have also ordered lab work to evaluate your shortness of breath.  We will call you when we receive these results.  If you develop chest pain, lower extremity swelling, unilateral lower extremity swelling, or increased shortness of breath please present to the emergency room for further evaluation.  We will tentatively plan on a 1 week follow-up with our office  Nice talking to you over the phone -hope you get feeling better  [place]  1. Shortness of breath  Chest x-ray today  Lab work today Will order lab work to be completed at [facility] per your request based off of your insurance coverage  If symptoms are not improving please present to an emergency room for further evaluation  2. Current tobacco use  We recommend that you stop using smokeless tobacco  We recommend that you stop smoking.  >>>You need to set a quit date >>>If you have friends or family who smoke, let them know you are trying to quit and not to smoke around you or in your living environment  Smoking Cessation Resources:  1 800 QUIT NOW  >>> Patient to call this resource and utilize it to help support her quit smoking >>> Keep up your hard work with stopping smoking  You can also contact the [facility] >>>For smoking cessation classes call 3195981150  We do not recommend using e-cigarettes as a form of stopping smoking  You can sign up for smoking cessation support texts and information:  >>>https://smokefree.gov/smokefreetxt  3. Former smoker  Continue to not smoke cigarettes  4. Abnormal findings on diagnostic imaging of lung  Chest x-ray today  May need to consider CT imaging of your chest  5. Restrictive lung disease   - benzonatate  (TESSALON) 200 MG capsule; Take 1 capsule (200 mg total) by mouth 3 (three) times daily as needed for cough.  Dispense: 30 capsule; Refill: 2  Spiriva Respimat 1.25 >>> 2 puffs daily >>> Do this every day >>>This is not a rescue inhaler  6. OSA (obstructive sleep apnea)  Continue CPAP therapy   We recommend today:   Meds ordered this encounter  Medications  . benzonatate (TESSALON) 200 MG capsule    Sig: Take 1 capsule (200 mg total) by mouth 3 (three) times daily as needed for cough.    Dispense:  30 capsule    Refill:  2    Follow Up:    Return in about 1 week (around 02/12/2020), or if symptoms worsen or fail to improve, for Follow up with Elisha Headland FNP-C.   Please do your part to reduce the spread of COVID-19:      Reduce your risk of any infection  and COVID19 by using the similar precautions used for avoiding the common cold or flu:  Marland Kitchen Wash your hands often with soap and warm water for at least 20 seconds.  If soap and water are not readily available, use an alcohol-based hand sanitizer with at least 60% alcohol.  . If coughing or sneezing, cover your mouth and nose by coughing or sneezing into the elbow areas of your shirt or coat, into a tissue or into your sleeve (not your hands). Drinda Butts A  MASK when in public  . Avoid shaking hands with others and consider head nods or verbal greetings only. . Avoid touching your eyes, nose, or mouth with unwashed hands.  . Avoid close contact with people who are sick. . Avoid places or events with large numbers of people in one location, like concerts or sporting events. . If you have some symptoms but not all symptoms, continue to monitor at home and seek medical attention if your symptoms worsen. . If you are having a medical emergency, call 911.   [place] / e-Visit: [email or web address]         [facility]:  [facility]: [phone]                   [facility]: [phone]     It is flu season:   >>> Best ways to protect herself from the flu: Receive the yearly flu vaccine, practice good hand hygiene washing with soap and also using hand sanitizer when available, eat a nutritious meals, get adequate rest, hydrate appropriately   Please contact the office if your symptoms worsen or you have concerns that you are not improving.   Thank you for choosing [facility] for your healthcare, and for allowing [place] to partner with you on your healthcare journey. I am thankful to be able to provide care to you today.   Wyn Quaker FNP-C

## 2020-02-05 NOTE — Assessment & Plan Note (Addendum)
Plan: We will clinically monitor you Need to further discuss at next visit obstructive sleep apnea

## 2020-02-05 NOTE — Assessment & Plan Note (Signed)
Current smokeless tobacco user  Plan: We recommend that you do not use smokeless tobacco

## 2020-02-06 ENCOUNTER — Ambulatory Visit
Admission: RE | Admit: 2020-02-06 | Discharge: 2020-02-06 | Disposition: A | Discharge status: Home / Self Care | Payer: BC Managed Care – PPO | Source: Ambulatory Visit | Attending: Pulmonary Disease | Admitting: Pulmonary Disease

## 2020-02-06 ENCOUNTER — Other Ambulatory Visit: Payer: Self-pay | Admitting: Pulmonary Disease

## 2020-02-06 ENCOUNTER — Telehealth: Payer: Self-pay | Admitting: Pulmonary Disease

## 2020-02-06 DIAGNOSIS — R0602 Shortness of breath: Secondary | ICD-10-CM | POA: Insufficient documentation

## 2020-02-06 LAB — D-DIMER, QUANTITATIVE: D-DIMER: 1.54 mg/L FEU — ABNORMAL HIGH (ref 0.00–0.49)

## 2020-02-06 NOTE — Telephone Encounter (Signed)
Margie and T,   See note from Dr. Arsenio Loader. Please forward to Dr. Belia Heman for follow up. We need to get the patient scheduled for follow up in the [facility] office for eval. If shortness of breath worsens patient needs CTA chest to rule out PE.   Elisha Headland FNP

## 2020-02-06 NOTE — Telephone Encounter (Signed)
Noted. Will ensure [facility] office follows up with him.   Thanks   Elisha Headland FNP

## 2020-02-06 NOTE — Telephone Encounter (Signed)
Called by [facility] with results of a STAT D-Dimer ordered in the outpatient setting. D-Dimer result = 1.54 (Normal = 0.00 - 0.49). Very hard to interpret this test in light of his history of Asthma and COPD. The patient states that he is prone to bronchitis and pneumonia. He certainly can have an elevated D-Dimer on the basis of bronchitis and/or pneumonia. However, with recent neck surgery, the differential diagnosis also includes PE. He is currently on Plavix and has no c/o leg swelling at this time both of which make the diagnosis of PE less likely. I hate to send him to the Emergency Department for a CTA given the above history. I have instructed him to go the the Emergency Department for a CTA if his SOB gets worse. If his SOB is stable, I have instructed him to call the [facility] office in the morning for further instructions.

## 2020-02-07 ENCOUNTER — Emergency Department: Payer: BC Managed Care – PPO

## 2020-02-07 ENCOUNTER — Other Ambulatory Visit: Payer: Self-pay

## 2020-02-07 ENCOUNTER — Emergency Department
Admission: EM | Admit: 2020-02-07 | Discharge: 2020-02-07 | Disposition: A | Discharge status: Home / Self Care | Payer: BC Managed Care – PPO | Attending: Student | Admitting: Student

## 2020-02-07 ENCOUNTER — Encounter: Payer: Self-pay | Admitting: Emergency Medicine

## 2020-02-07 DIAGNOSIS — Z20822 Contact with and (suspected) exposure to covid-19: Secondary | ICD-10-CM | POA: Diagnosis not present

## 2020-02-07 DIAGNOSIS — I2510 Atherosclerotic heart disease of native coronary artery without angina pectoris: Secondary | ICD-10-CM | POA: Insufficient documentation

## 2020-02-07 DIAGNOSIS — Z87891 Personal history of nicotine dependence: Secondary | ICD-10-CM | POA: Diagnosis not present

## 2020-02-07 DIAGNOSIS — J449 Chronic obstructive pulmonary disease, unspecified: Secondary | ICD-10-CM | POA: Diagnosis not present

## 2020-02-07 DIAGNOSIS — Z79899 Other long term (current) drug therapy: Secondary | ICD-10-CM | POA: Insufficient documentation

## 2020-02-07 DIAGNOSIS — I10 Essential (primary) hypertension: Secondary | ICD-10-CM | POA: Insufficient documentation

## 2020-02-07 DIAGNOSIS — J189 Pneumonia, unspecified organism: Secondary | ICD-10-CM | POA: Insufficient documentation

## 2020-02-07 DIAGNOSIS — R0602 Shortness of breath: Secondary | ICD-10-CM | POA: Diagnosis present

## 2020-02-07 LAB — CBC
HCT: 45.4 % (ref 39.0–52.0)
Hemoglobin: 15.2 g/dL (ref 13.0–17.0)
MCH: 32.4 pg (ref 26.0–34.0)
MCHC: 33.5 g/dL (ref 30.0–36.0)
MCV: 96.8 fL (ref 80.0–100.0)
Platelets: 516 10*3/uL — ABNORMAL HIGH (ref 150–400)
RBC: 4.69 MIL/uL (ref 4.22–5.81)
RDW: 12.3 % (ref 11.5–15.5)
WBC: 7 10*3/uL (ref 4.0–10.5)
nRBC: 0 % (ref 0.0–0.2)

## 2020-02-07 LAB — CBC WITH DIFFERENTIAL/PLATELET
Basophils Absolute: 0 x10E3/uL (ref 0.0–0.2)
Basos: 1 %
EOS (ABSOLUTE): 0.1 x10E3/uL (ref 0.0–0.4)
Eos: 1 %
Hematocrit: 44.9 % (ref 37.5–51.0)
Hemoglobin: 15.3 g/dL (ref 13.0–17.7)
Immature Grans (Abs): 0 x10E3/uL (ref 0.0–0.1)
Immature Granulocytes: 0 %
Lymphocytes Absolute: 1.5 x10E3/uL (ref 0.7–3.1)
Lymphs: 22 %
MCH: 32.4 pg (ref 26.6–33.0)
MCHC: 34.1 g/dL (ref 31.5–35.7)
MCV: 95 fL (ref 79–97)
Monocytes Absolute: 0.3 x10E3/uL (ref 0.1–0.9)
Monocytes: 4 %
Neutrophils Absolute: 5.1 x10E3/uL (ref 1.4–7.0)
Neutrophils: 72 %
Platelets: 490 x10E3/uL — ABNORMAL HIGH (ref 150–450)
RBC: 4.72 x10E6/uL (ref 4.14–5.80)
RDW: 12.1 % (ref 11.6–15.4)
WBC: 7 x10E3/uL (ref 3.4–10.8)

## 2020-02-07 LAB — COMPREHENSIVE METABOLIC PANEL
ALT: 67 IU/L — ABNORMAL HIGH (ref 0–44)
AST: 42 IU/L — ABNORMAL HIGH (ref 0–40)
Albumin/Globulin Ratio: 1.3 (ref 1.2–2.2)
Albumin: 4 g/dL (ref 4.0–5.0)
Alkaline Phosphatase: 115 IU/L (ref 39–117)
BUN/Creatinine Ratio: 16 (ref 9–20)
BUN: 16 mg/dL (ref 6–24)
Bilirubin Total: 0.3 mg/dL (ref 0.0–1.2)
CO2: 20 mmol/L (ref 20–29)
Calcium: 9.4 mg/dL (ref 8.7–10.2)
Chloride: 107 mmol/L — ABNORMAL HIGH (ref 96–106)
Creatinine, Ser: 0.97 mg/dL (ref 0.76–1.27)
GFR calc Af Amer: 107 mL/min/{1.73_m2} (ref >59)
GFR calc non Af Amer: 93 mL/min/{1.73_m2} (ref >59)
Globulin, Total: 3 g/dL (ref 1.5–4.5)
Glucose: 145 mg/dL — ABNORMAL HIGH (ref 65–99)
Potassium: 4.7 mmol/L (ref 3.5–5.2)
Sodium: 143 mmol/L (ref 134–144)
Total Protein: 7 g/dL (ref 6.0–8.5)

## 2020-02-07 LAB — SPECIMEN STATUS REPORT

## 2020-02-07 LAB — BASIC METABOLIC PANEL WITH GFR
Anion gap: 11 (ref 5–15)
BUN: 13 mg/dL (ref 6–20)
CO2: 18 mmol/L — ABNORMAL LOW (ref 22–32)
Calcium: 9.2 mg/dL (ref 8.9–10.3)
Chloride: 108 mmol/L (ref 98–111)
Creatinine, Ser: 0.88 mg/dL (ref 0.61–1.24)
GFR calc Af Amer: >60 mL/min
GFR calc non Af Amer: >60 mL/min
Glucose, Bld: 149 mg/dL — ABNORMAL HIGH (ref 70–99)
Potassium: 3.9 mmol/L (ref 3.5–5.1)
Sodium: 137 mmol/L (ref 135–145)

## 2020-02-07 LAB — SARS CORONAVIRUS 2 (TAT 6-24 HRS): SARS Coronavirus 2: NEGATIVE

## 2020-02-07 LAB — TROPONIN I (HIGH SENSITIVITY): Troponin I (High Sensitivity): <2 ng/L

## 2020-02-07 LAB — BRAIN NATRIURETIC PEPTIDE: BNP: 2.9 pg/mL (ref 0.0–100.0)

## 2020-02-07 MED ORDER — CLINDAMYCIN HCL 300 MG PO CAPS: 300.0000 mg | ORAL_CAPSULE | Freq: Three times a day (TID) | ORAL | 0 refills | Status: AC

## 2020-02-07 MED ORDER — IOHEXOL 350 MG/ML SOLN
75.0000 mL | Freq: Once | INTRAVENOUS | Status: AC | PRN
  Administered 2020-02-07: 75 mL via INTRAVENOUS

## 2020-02-07 MED ORDER — CEFDINIR 300 MG PO CAPS: 300.0000 mg | ORAL_CAPSULE | Freq: Two times a day (BID) | ORAL | 0 refills | Status: AC

## 2020-02-07 NOTE — Telephone Encounter (Signed)
02/07/2020  [place] triage,  Please follow-up with patient and reschedule follow-up appointment.  Patient will need follow-up in office at [facility] clinic sometime over the next 2 to 4 weeks with APP or Dr. Belia Heman.  If nothing available in that timeframe please schedule next available then.  Patient was seen in the emergency room CTA chest negative for PE.  Patient was diagnosed with pneumonia.  Unable to fully view chart in epic as the note has not been finished.  When scheduling the follow-up appointment.  Please confirm the patient was treated with antibiotics by ED. please confirm the name of the antibiotic, dosage and length of course.  Please add to patient's med list if not already present.  We will route to Dr. Belia Heman as Lorain Childes.   Elisha Headland FNP

## 2020-02-07 NOTE — Telephone Encounter (Signed)
Called pt to get set up for f/u appt. LVM.

## 2020-02-07 NOTE — Telephone Encounter (Signed)
DK please see below messages. Thanks

## 2020-02-07 NOTE — Telephone Encounter (Signed)
Called and LVMTCB 

## 2020-02-07 NOTE — Telephone Encounter (Signed)
I recommend eval. at ED TODAY. He may have a PE.

## 2020-02-07 NOTE — Telephone Encounter (Signed)
Called and left a VM for pt.  Called EC, Shimon Trowbridge, pt's wife. Spoke with her. She states the pt is sleeping. Advised her of the recs per Elisha Headland, NP, and Dr. Jayme Cloud. She states she will take pt immediately to the closest ED. Will forward to Elisha Headland, NP, and Dr. Jayme Cloud as Lorain Childes.

## 2020-02-07 NOTE — Telephone Encounter (Signed)
Pt had cervical surgery 2 weeks ago and has had increased SOB. He states he always has SOB but has slightly increased since surgey. Dr. Arsenio Loader recommended for him to go to the ED if SOB got worse overnight and it did not. Pt mentions that when he takes a deep breathe it hurts and throws him into a coughing fit. He did a breathing treatment which helped him a little. He has to have another surgey but he wants to be sure he is well enough to do so.  He is a pt of [facility] but he doesn't have any openings until mid April. I wanted to know if you would be able to see him next week. DG please advise.

## 2020-02-07 NOTE — Telephone Encounter (Signed)
Agree with Dr. Isaias Cowman

## 2020-02-07 NOTE — ED Provider Notes (Signed)
[facility] Emergency Department Provider Note  ____________________________________________   First MD Initiated Contact with Patient 02/07/20 1353     (approximate)  I have reviewed the triage vital signs and the nursing notes.  History  Chief Complaint Shortness of Breath and Chest Pain    HPI Cory Espinoza is a 49 y.o. male with hx of COPD, restrictive lung disease, recent ACDF in mid February who presents for SOB and cough.   Patient reports since his surgery he has felt more SOB than normal. Symptoms have been constant and mildly worsening since onset. He is especially winded walking short distances, which is not normal for him. Reports an increased cough as well, no hemoptysis. No fevers or chest pain. No leg swelling. No hx of VTE.    Seen as outpatient and recommended evaluation in the ED to rule out PE given recent post-operative states.    Past Medical Hx Past Medical History:  Diagnosis Date  . Allergy   . Anxiety   . Arthritis    lumbar spine  . Atrial fibrillation (HCC)   . COPD (chronic obstructive pulmonary disease) (HCC)    Restrictive lung disease  . Coronary artery disease   . GERD (gastroesophageal reflux disease)   . Headache    [name], onset October 2020, cluster headaches in the past  . History of kidney stones   . Hyperlipidemia   . Hypertension   . Myocardial infarction (HCC)    at age 65  . Pneumonia   . Sleep apnea    unable to use cpap since onset of migraines    Problem List Patient Active Problem List   Diagnosis Date Noted  . Current tobacco use 02/05/2020  . Abnormal findings on diagnostic imaging of lung 02/05/2020  . Shortness of breath 02/05/2020  . Herniated disc, cervical 01/23/2020  . Cervical spondylosis with myelopathy and radiculopathy 01/23/2020  . Pre-operative respiratory examination 01/12/2020  . Former smoker 08/27/2018  . Benign essential HTN 08/27/2018  . Cough productive  of purulent sputum 11/08/2017  . GERD (gastroesophageal reflux disease) 05/06/2017  . Contusion of hand 04/22/2017  . BP (high blood pressure) 04/22/2017  . Oxygen desaturation 04/22/2017  . Prostate lump 04/22/2017  . Unstable angina (HCC) 04/27/2016  . Chest pain 04/24/2016  . Morbid obesity (HCC) 01/29/2016  . Elevated ALT measurement 10/30/2015  . Nonspecific elevation of levels of transaminase and lactic acid dehydrogenase (ldh) 10/30/2015  . Reduced libido 10/29/2015  . Hyperlipidemia 08/06/2015  . Anxiety 08/06/2015  . Atherosclerosis of coronary artery 08/06/2015  . Childhood asthma 08/06/2015  . Chronic obstructive pulmonary disease (HCC) 08/06/2015  . OSA (obstructive sleep apnea) 08/06/2015  . Anxiety disorder 08/06/2015  . Atherosclerosis of native coronary artery of native heart with stable angina pectoris (HCC) 08/06/2015  . Uncomplicated asthma 08/06/2015    Past Surgical Hx Past Surgical History:  Procedure Laterality Date  . ANTERIOR CERVICAL DECOMP/DISCECTOMY FUSION N/A 01/23/2020   Procedure: ANTERIOR CERVICAL DECOMPRESSION FUSION - CERVICAL SIX-CERVICAL SEVEN;  Surgeon: Julio Sicks, MD;  Location: [facility] OR;  Service: Neurosurgery;  Laterality: N/A;  . BACK SURGERY  1996  . CARDIAC CATHETERIZATION Left 04/27/2016   Procedure: Left Heart Cath and Coronary Angiography;  Surgeon: Lamar Blinks, MD;  Location: [facility] INVASIVE CV LAB;  Service: Cardiovascular;  Laterality: Left;  . CARDIAC CATHETERIZATION N/A 04/27/2016   Procedure: Intravascular Pressure Wire/FFR Study;  Surgeon: Alwyn Pea, MD;  Location: [facility] INVASIVE CV LAB;  Service:  Cardiovascular;  Laterality: N/A;  . CATHETERIZATION OF PULMONARY ARTERY WITH RETRIEVAL OF FOREIGN BODY Bilateral 04/07/2011   heart  . KNEE ARTHROSCOPY Bilateral 2004  . LUMBAR DISC SURGERY  1999    Medications Prior to Admission medications   Medication Sig Start Date End Date Taking? Authorizing Provider  AIMOVIG 70 MG/ML  SOAJ Inject 70 mg into the skin every 28 (twenty-eight) days. 12/22/19   [provider]  albuterol (PROVENTIL HFA;VENTOLIN HFA) 108 (90 Base) MCG/ACT inhaler Inhale 1-2 puffs into the lungs every 4 (four) hours as needed for wheezing or shortness of breath. 06/03/18   Flora Lipps, MD  ALPRAZolam Duanne Moron) 1 MG tablet Take 1 mg by mouth 3 (three) times daily. 01/15/20   [provider]  atorvastatin (LIPITOR) 40 MG tablet Take 1 tablet (40 mg total) by mouth at bedtime. 02/04/18   Arnetha Courser, MD  B COMPLEX VITAMINS PO Take 1 tablet by mouth daily.     [provider]  baclofen (LIORESAL) 10 MG tablet Take 10 mg by mouth 2 (two) times daily as needed for muscle spasms.    [provider]  benzonatate (TESSALON) 200 MG capsule Take 1 capsule (200 mg total) by mouth 3 (three) times daily as needed for cough. 02/05/20   Lauraine Rinne, NP  cetirizine (ZYRTEC) 10 MG tablet TAKE 1 TABLET(10 MG) BY MOUTH DAILY Patient taking differently: Take 10 mg by mouth daily.  04/24/19   Flora Lipps, MD  Cholecalciferol (VITAMIN D) 125 MCG (5000 UT) CAPS Take 5,000 Units by mouth daily.    [provider]  clopidogrel (PLAVIX) 75 MG tablet Take 1 tablet (75 mg total) by mouth daily. 02/04/18   Lada, Satira Anis, MD  escitalopram (LEXAPRO) 20 MG tablet Take 20 mg by mouth at bedtime.     [provider]  gabapentin (NEURONTIN) 300 MG capsule Take 600 mg by mouth at bedtime. 12/28/19   [provider]  ipratropium-albuterol (DUONEB) 0.5-2.5 (3) MG/3ML SOLN Take 3 mLs by nebulization every 4 (four) hours as needed. DX: J44.9 COPD 02/05/20   Lauraine Rinne, NP  ketorolac (TORADOL) 10 MG tablet Take 10 mg by mouth every 6 (six) hours as needed for moderate pain.    [provider]  MAGNESIUM CITRATE PO Take 3 capsules by mouth daily.    [provider]  Melatonin 10 MG TABS Take 30 mg by mouth at bedtime.    [provider]  nitroGLYCERIN  (NITROSTAT) 0.4 MG SL tablet Place 0.4 mg under the tongue every 5 (five) minutes as needed for chest pain.    [provider]  NURTEC 75 MG TBDP Place 75 mg under the tongue daily as needed (migraine).  01/10/20   [provider]  oxyCODONE-acetaminophen (PERCOCET/ROXICET) 5-325 MG tablet Take 1-2 tablets by mouth every 4 (four) hours as needed for severe pain. 01/24/20   Earnie Larsson, MD  pantoprazole (PROTONIX) 40 MG tablet TAKE 1 TABLET(40 MG) BY MOUTH DAILY Patient taking differently: Take 40 mg by mouth daily.  01/27/19   Flora Lipps, MD  POTASSIUM PO Take 1 tablet by mouth daily.    [provider]  promethazine (PHENERGAN) 25 MG tablet Take 25 mg by mouth every 6 (six) hours as needed for nausea or vomiting.    [provider]  pyridOXINE (VITAMIN B-6) 100 MG tablet Take 100 mg by mouth at bedtime.    [provider]  Saw Palmetto 450 MG CAPS Take 900  mg by mouth daily.     [provider]  Tiotropium Bromide Monohydrate (SPIRIVA RESPIMAT) 1.25 MCG/ACT AERS Inhale 2 puffs into the lungs 2 (two) times daily. Patient taking differently: Inhale 2 puffs into the lungs every morning.  06/03/18   Erin Fulling, MD  zonisamide (ZONEGRAN) 100 MG capsule Take 300 mg by mouth at bedtime. 01/05/20   [provider]    Allergies Aspirin, Amoxicillin, Codeine, Penicillins, Hydrocodone-acetaminophen, and Oxycodone-acetaminophen  Family Hx Family History  Problem Relation Age of Onset  . Heart disease Mother   . Heart disease Father   . Heart attack Father 81  . Diabetes Sister   . Hypertension Sister   . Hyperlipidemia Sister   . Hypertension Brother   . Heart attack Brother 45  . Cancer Paternal Grandmother        breast  . Diabetes Paternal Grandmother   . Hyperlipidemia Brother   . Supraventricular tachycardia Brother   . Heart attack Paternal Grandfather 72    Social Hx Social History   Tobacco Use  . Smoking status: Former  Smoker    Packs/day: 2.00    Years: 23.00    Pack years: 46.00    Types: Cigarettes    Start date: 10/28/1989    Quit date: 10/28/2012    Years since quitting: 7.2  . Smokeless tobacco: Current User    Types: Snuff  Substance Use Topics  . Alcohol use: Yes    Alcohol/week: 0.0 standard drinks    Comment: rarely  . Drug use: No     Review of Systems  Constitutional: Negative for fever, chills. Eyes: Negative for visual changes. ENT: Negative for sore throat. Cardiovascular: Negative for chest pain. Respiratory: + for shortness of breath. Gastrointestinal: Negative for nausea, vomiting.  Genitourinary: Negative for dysuria. Musculoskeletal: Negative for leg swelling. Skin: Negative for rash. Neurological: Negative for headaches.   Physical Exam  Vital Signs: ED Triage Vitals  Enc Vitals Group     BP 02/07/20 1245 113/75     Pulse Rate 02/07/20 1245 93     Resp 02/07/20 1245 (!) 24     Temp 02/07/20 1245 98.1 F (36.7 C)     Temp Source 02/07/20 1245 Oral     SpO2 02/07/20 1245 94 %     Weight 02/07/20 1241 214 lb (97.1 kg)     Height 02/07/20 1241 6' (1.829 m)     Head Circumference --      Peak Flow --      Pain Score 02/07/20 1240 8     Pain Loc --      Pain Edu? --      Excl. in GC? --     Constitutional: Alert and oriented. Appears tired. Head: Normocephalic. Atraumatic. Eyes: Conjunctivae clear, sclera anicteric. Pupils equal and symmetric. Nose: No masses or lesions. No congestion. No rhinorrhea. Mouth/Throat: Wearing mask.  Neck: No stridor. No masses or thyromegaly. Incision site well healing. No drainage. Cardiovascular: Normal rate, regular rhythm. Extremities well perfused. Respiratory: Normal respiratory effort.  Scattered course lung sounds on R, primarily expiratory. Gastrointestinal: Soft. Non-distended. Non-tender.  Genitourinary: Deferred. Musculoskeletal: No lower extremity edema. No deformities. Neurologic:  Normal speech and language.  No gross focal or lateralizing neurologic deficits are appreciated.  Skin: Skin is warm, dry and intact. No rash noted. ACDF incision site appear to be healing well. No drainage. Psychiatric: Mood and affect are appropriate for situation.  EKG  Personally reviewed and interpreted by myself.  Rate: 102 Rhythm: sinus Axis: normal Intervals: normal S1Q3T3 Non-specific ST/T wave changes No STEMI    Radiology  Personally reviewed imaging myself.   CT: IMPRESSION:  Negative for pulmonary embolus.   Patchy airspace disease in the posterior right upper and right lower lobes has an appearance most worrisome for pneumonia. Dependent opacity in the left lung base is most in keeping with atelectasis.    Procedures  Procedure(s) performed (including critical care):  Procedures   Initial Impression / Assessment and Plan / MDM / ED Course  49 y.o. male who presents to the ED for SOB, DOE, cough  Ddx: post operative PE, PNA, COPD exacerbation, COVID, ACS  Labs w/o actionable derangements. Troponin negative. COVID swab sent and pending. CT negative for PE but is notable for likely R sided pneumonia. Will opt to treat given symptoms and cover for aspiration as well since he was intubated for his procedure and findings are R sided. Wife states he has tolerated cephalexin previously w/o issues. Will plan for course of cefdinir and clindamycin (as recommended by pharmacy) and advised outpatient follow up. Patient and wife agreeable. Given return precautions.     As part of my medical decision making I have reviewed available labs, radiological tests, reviewed old records, obtained additional history from family (wife via phone).   Final Clinical Impression(s) / ED Diagnosis  Final diagnoses:  Pneumonia of right lung due to infectious organism, unspecified part of lung       Note:  This document was prepared using Dragon voice recognition software and may include unintentional  dictation errors.   Miguel Aschoff., MD 02/08/20 0001

## 2020-02-07 NOTE — Discharge Instructions (Addendum)
Thank you for letting us take care of you in the emergency department today.   Please continue to take any regular, prescribed medications.   New medications we have prescribed:  - Cefdinir (Omnicef) - Clindamycin  - Both are antibiotics, please take as directed. Obtain an over the counter probiotic and take while on the antibiotics to help with diarrhea.  Please follow up with: - Your primary care doctor to review your ER visit and follow up on your symptoms.   Please return to the ER for any new or worsening symptoms.

## 2020-02-07 NOTE — ED Triage Notes (Signed)
Pt reports had surgery on his neck 2 weeks ago and has been feeling SOB and having CP. Pt reports his MD told him to come to the ED to rule out PE.

## 2020-02-08 ENCOUNTER — Other Ambulatory Visit (HOSPITAL_COMMUNITY): Payer: Self-pay | Admitting: Neurology

## 2020-02-08 ENCOUNTER — Other Ambulatory Visit: Payer: Self-pay | Admitting: Neurology

## 2020-02-08 DIAGNOSIS — G43111 Migraine with aura, intractable, with status migrainosus: Secondary | ICD-10-CM

## 2020-02-08 NOTE — Telephone Encounter (Signed)
Ok

## 2020-02-08 NOTE — Telephone Encounter (Signed)
Called and spoke with pts wife. Pt has been schedule on 02/12/2019 with DG. Antibiotics that were given at the ED have been documented in pts chart. Nothing further needed.

## 2020-02-08 NOTE — Telephone Encounter (Signed)
LVMTCB x 1

## 2020-02-08 NOTE — Telephone Encounter (Signed)
DG would you be able to see this pt next week as [facility] does not have any availability until 4/20. Please advise.

## 2020-02-12 ENCOUNTER — Ambulatory Visit: Payer: BC Managed Care – PPO | Admitting: Pulmonary Disease

## 2020-02-12 NOTE — Telephone Encounter (Signed)
Dr. Belia Heman please advise on letter and what you would like it to state. Thanks

## 2020-02-12 NOTE — Telephone Encounter (Signed)
Dr. Belia Heman, you seen this patient on 11/09/2019

## 2020-02-12 NOTE — Telephone Encounter (Signed)
I have never physically seen this patient in office.  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  He is a Psychologist, occupational pt.    I cannot document patient's disability forms based off of 1 televisit.  Patient can coordinate this with primary care or an in office visit with the [facility] team.  Please also send pt to [facility] and Dr. Belia Heman.   Pt may need to be worked into an OV with Dr. Belia Heman at his discretion as he has seen the patient before/ pt has these ongoing acute issues. This cannot be handled telephonically.    Elisha Headland FNP

## 2020-03-02 DIAGNOSIS — G43411 Hemiplegic migraine, intractable, with status migrainosus: Secondary | ICD-10-CM | POA: Insufficient documentation

## 2020-03-04 ENCOUNTER — Ambulatory Visit: Payer: BC Managed Care – PPO | Admitting: Pulmonary Disease

## 2020-03-04 ENCOUNTER — Ambulatory Visit (HOSPITAL_COMMUNITY)
Admission: RE | Admit: 2020-03-04 | Discharge: 2020-03-04 | Disposition: A | Discharge status: Home / Self Care | Payer: BC Managed Care – PPO | Source: Ambulatory Visit | Attending: Neurology | Admitting: Neurology

## 2020-03-04 ENCOUNTER — Encounter: Payer: Self-pay | Admitting: Pulmonary Disease

## 2020-03-04 ENCOUNTER — Other Ambulatory Visit: Payer: Self-pay | Admitting: Neurosurgery

## 2020-03-04 ENCOUNTER — Other Ambulatory Visit: Payer: Self-pay

## 2020-03-04 VITALS — BP 114/80 | HR 76 | Temp 98.4°F | Ht 72.0 in | Wt 212.0 lb

## 2020-03-04 DIAGNOSIS — G43111 Migraine with aura, intractable, with status migrainosus: Secondary | ICD-10-CM

## 2020-03-04 DIAGNOSIS — R918 Other nonspecific abnormal finding of lung field: Secondary | ICD-10-CM

## 2020-03-04 DIAGNOSIS — G4733 Obstructive sleep apnea (adult) (pediatric): Secondary | ICD-10-CM

## 2020-03-04 DIAGNOSIS — J449 Chronic obstructive pulmonary disease, unspecified: Secondary | ICD-10-CM

## 2020-03-04 DIAGNOSIS — R0602 Shortness of breath: Secondary | ICD-10-CM | POA: Diagnosis not present

## 2020-03-04 DIAGNOSIS — J181 Lobar pneumonia, unspecified organism: Secondary | ICD-10-CM

## 2020-03-04 DIAGNOSIS — R5381 Other malaise: Secondary | ICD-10-CM | POA: Insufficient documentation

## 2020-03-04 DIAGNOSIS — Z720 Tobacco use: Secondary | ICD-10-CM

## 2020-03-04 MED ORDER — SPIRIVA RESPIMAT 2.5 MCG/ACT IN AERS: 2.0000 | INHALATION_SPRAY | Freq: Every day | RESPIRATORY_TRACT | 0 refills | Status: DC

## 2020-03-04 NOTE — Assessment & Plan Note (Signed)
Current smokeless tobacco user  Plan: Emphasized importance the patient that he needs to stop using smokeless tobacco

## 2020-03-04 NOTE — Assessment & Plan Note (Signed)
Patient continues to have ongoing shortness of breath and fatigue This is likely multifactorial given the fact that he has a recent pneumonia, COPD, and recent back surgeries Vital signs today in office are stable Clinical improvement status post antibiotics  Plan: We will continue to clinically monitor

## 2020-03-04 NOTE — Assessment & Plan Note (Signed)
Plan: We will clinically monitor patient We will further discuss at next office visit

## 2020-03-04 NOTE — Progress Notes (Signed)
@Patient  ID: , male    DOB: 09/21/1971, 49 y.o.   MRN: [id number]  Chief Complaint  Patient presents with  . Follow-up    F/U from ED visit for PNA. Still having SOB episodes.     Referring provider: [id number], MD  HPI:  49 year old male former smoker/current smokeless tobacco user followed in our office for COPD  Past medical history: Hyperlipidemia, anxiety, obesity, GERD hypertension, status post C6-7 cervical fusion Smoking History: Former Smoker. Quit 2013. 46 pack years. Current smokeless user.  Maintenance: Spiriva Resp 1.25 Pt of: Dr. 2014  03/04/2020  - Visit   49 year old male former smoker current smokeless tobacco user followed in our office for COPD.  Patient quit smoking in 2013.  Unfortunately he does continue to use smokeless tobacco.  He is presenting to office today as a follow-up after being seen in the emergency room.  Patient was seen in the emergency room on 02/07/2020 and felt to have pneumonia of the right lung.  He was treated with cefdinir as well as clindamycin at that time.  Patient does have an allergy to penicillins.  Patient reports that he is feeling better since being treated with antibiotics.  He reports he is producing less mucus, he is less winded, less cough.  Patient does continue to have ongoing fatigue as well as a dry cough.  He is planning to have a second back surgery in mid to late April/2021.  He continues to be maintained on Spiriva Respimat 1.25.  He feels that this is helping him.  He continues to use smokeless tobacco.  We will discuss this today.   Questionaires / Pulmonary Flowsheets:   MMRC: mMRC Dyspnea Scale mMRC Score  03/04/2020 2    Tests:   PFT  12/2017- Small airways obstruction with restrictive lung disease from increased weight FEF 25/75 reduced to 66% predicted Fev1/FVC ratio WNL FVC reduced TLC reduced DLCO NL when corrected for volumes  Imaging: CT chest 3.29.19- persistent b/l infiltrates  R>L BUT Much improved since last CT scan  02/07/2020-CTA chest-negative for PE, patchy airspace disease in posterior right upper lobe and right lower lobes of the appearance most worrisome for pneumonia    FENO:  No results found for: NITRICOXIDE  PFT: No flowsheet data found.  WALK:  No flowsheet data found.  Imaging: DG Chest 2 View  Result Date: 02/07/2020 CLINICAL DATA:  Chest pain. EXAM: CHEST - 2 VIEW COMPARISON:  February 06, 2020. FINDINGS: The heart size and mediastinal contours are within normal limits. No pneumothorax or pleural effusion is noted. Right lung is clear. Stable minimal left basilar subsegmental atelectasis or scarring is noted. The visualized skeletal structures are unremarkable. IMPRESSION: Stable minimal left basilar subsegmental atelectasis or scarring. Electronically Signed   By: February 08, 2020 M.D.   On: 02/07/2020 13:32   DG Chest 2 View  Result Date: 02/06/2020 CLINICAL DATA:  Shortness of breath EXAM: CHEST - 2 VIEW COMPARISON:  01/10/2020 FINDINGS: Surgical hardware in the cervical spine. Linear scar or atelectasis at the lingula and left base. No consolidation or effusion. Normal heart size. No pneumothorax. IMPRESSION: No active cardiopulmonary disease. Linear scarring or atelectasis at the left base. Electronically Signed   By: 03/09/2020 M.D.   On: 02/06/2020 20:12   CT Angio Chest PE W and/or Wo Contrast  Result Date: 02/07/2020 CLINICAL DATA:  Status post C6-7 fusion 2 weeks ago. The patient now has chest pain and shortness of breath. EXAM: CT  ANGIOGRAPHY CHEST WITH CONTRAST TECHNIQUE: Multidetector CT imaging of the chest was performed using the standard protocol during bolus administration of intravenous contrast. Multiplanar CT image reconstructions and MIPs were obtained to evaluate the vascular anatomy. CONTRAST:  75 mL OMNIPAQUE IOHEXOL 350 MG/ML SOLN COMPARISON:  PA and lateral chest today. FINDINGS: Cardiovascular: Satisfactory opacification of  the pulmonary arteries to the segmental level. No evidence of pulmonary embolism. Normal heart size. No pericardial effusion. Mediastinum/Nodes: No enlarged mediastinal, hilar, or axillary lymph nodes. Thyroid gland, trachea, and esophagus demonstrate no significant findings. Lungs/Pleura: No pleural effusion. There is patchy airspace disease in the posterior aspect of the right upper lobe and right lower lobe with an appearance worrisome for pneumonia. Dependent opacity in the left lung base is most in keeping with atelectasis. Upper Abdomen: Negative. Musculoskeletal: No acute or focal abnormality. Review of the MIP images confirms the above findings. IMPRESSION: Negative for pulmonary embolus. Patchy airspace disease in the posterior right upper and right lower lobes has an appearance most worrisome for pneumonia. Dependent opacity in the left lung base is most in keeping with atelectasis. Electronically Signed   By: Drusilla Kannerhomas  Dalessio M.D.   On: 02/07/2020 13:59    Lab Results:  CBC    Component Value Date/Time   WBC 7.0 02/07/2020 1249   RBC 4.69 02/07/2020 1249   HGB 15.2 02/07/2020 1249   HGB 15.3 02/06/2020 1356   HCT 45.4 02/07/2020 1249   HCT 44.9 02/06/2020 1356   PLT 516 (H) 02/07/2020 1249   PLT 490 (H) 02/06/2020 1356   MCV 96.8 02/07/2020 1249   MCV 95 02/06/2020 1356   MCH 32.4 02/07/2020 1249   MCHC 33.5 02/07/2020 1249   RDW 12.3 02/07/2020 1249   RDW 12.1 02/06/2020 1356   LYMPHSABS 1.5 02/06/2020 1356   MONOABS 0.5 01/19/2020 1122   EOSABS 0.1 02/06/2020 1356   BASOSABS 0.0 02/06/2020 1356    BMET    Component Value Date/Time   NA 137 02/07/2020 1249   NA 143 02/06/2020 1356   K 3.9 02/07/2020 1249   CL 108 02/07/2020 1249   CO2 18 (L) 02/07/2020 1249   GLUCOSE 149 (H) 02/07/2020 1249   BUN 13 02/07/2020 1249   BUN 16 02/06/2020 1356   CREATININE 0.88 02/07/2020 1249   CALCIUM 9.2 02/07/2020 1249   GFRNONAA >60 02/07/2020 1249   GFRAA >60 02/07/2020 1249     BNP    Component Value Date/Time   BNP 2.9 02/06/2020 1356    ProBNP No results found for: PROBNP  Specialty Problems      Pulmonary Problems   Childhood asthma   Chronic obstructive pulmonary disease (HCC)   OSA (obstructive sleep apnea)   Uncomplicated asthma   Cough productive of purulent sputum   Shortness of breath   Lobar pneumonia, unspecified organism (HCC)      Allergies  Allergen Reactions  . Aspirin Anaphylaxis  . Amoxicillin Hives and Itching    Has patient had a PCN reaction causing immediate rash, facial/tongue/throat swelling, SOB or lightheadedness with hypotension: No Has patient had a PCN reaction causing severe rash involving mucus membranes or skin necrosis: No Has patient had a PCN reaction that required hospitalization: No Has patient had a PCN reaction occurring within the last 10 years: No If all of the above answers are "NO", then may proceed with Cephalosporin use.   . Codeine Hives  . Penicillins Hives and Itching    Has patient had a PCN reaction causing  immediate rash, facial/tongue/throat swelling, SOB or lightheadedness with hypotension: No Has patient had a PCN reaction causing severe rash involving mucus membranes or skin necrosis: No Has patient had a PCN reaction that required hospitalization: No Has patient had a PCN reaction occurring within the last 10 years: No If all of the above answers are "NO", then may proceed with Cephalosporin use.   Marland Kitchen Hydrocodone-Acetaminophen Itching  . Oxycodone-Acetaminophen Nausea And Vomiting    Immunization History  Administered Date(s) Administered  . Influenza, Seasonal, Injecte, Preservative Fre 10/07/2013  . Influenza,inj,Quad PF,6+ Mos 10/19/2014  . Influenza-Unspecified 08/28/2015, 09/24/2018, 08/31/2019    Past Medical History:  Diagnosis Date  . Allergy   . Anxiety   . Arthritis    lumbar spine  . Atrial fibrillation ([place])   . COPD (chronic obstructive pulmonary disease) (HCC)     Restrictive lung disease  . Coronary artery disease   . GERD (gastroesophageal reflux disease)   . Headache    [name], onset October 2020, cluster headaches in the past  . History of kidney stones   . Hyperlipidemia   . Hypertension   . Myocardial infarction ([place])    at age 71  . Pneumonia   . Sleep apnea    unable to use cpap since onset of migraines    Tobacco History: Social History   Tobacco Use  Smoking Status Former Smoker  . Packs/day: 2.00  . Years: 23.00  . Pack years: 46.00  . Types: Cigarettes  . Start date: 10/28/1989  . Quit date: 10/28/2012  . Years since quitting: 7.3  Smokeless Tobacco Current User  . Types: Snuff   Ready to quit: No Counseling given: Yes   Continue to not smoke.  Emphasized importance of the patient to stop using smokeless tobacco.  Outpatient Encounter Medications as of 03/04/2020  Medication Sig  . AIMOVIG 70 MG/ML SOAJ Inject 70 mg into the skin every 28 (twenty-eight) days.  Marland Kitchen albuterol (PROVENTIL HFA;VENTOLIN HFA) 108 (90 Base) MCG/ACT inhaler Inhale 1-2 puffs into the lungs every 4 (four) hours as needed for wheezing or shortness of breath.  . ALPRAZolam (XANAX) 1 MG tablet Take 1 mg by mouth 3 (three) times daily.  Marland Kitchen atorvastatin (LIPITOR) 40 MG tablet Take 1 tablet (40 mg total) by mouth at bedtime.  . B COMPLEX VITAMINS PO Take 1 tablet by mouth daily.   . baclofen (LIORESAL) 10 MG tablet Take 10 mg by mouth 2 (two) times daily as needed for muscle spasms.  . benzonatate (TESSALON) 200 MG capsule Take 1 capsule (200 mg total) by mouth 3 (three) times daily as needed for cough.  . cetirizine (ZYRTEC) 10 MG tablet TAKE 1 TABLET(10 MG) BY MOUTH DAILY (Patient taking differently: Take 10 mg by mouth daily. )  . Cholecalciferol (VITAMIN D) 125 MCG (5000 UT) CAPS Take 5,000 Units by mouth daily.  . clopidogrel (PLAVIX) 75 MG tablet Take 1 tablet (75 mg total) by mouth daily.  Marland Kitchen escitalopram (LEXAPRO) 20 MG tablet Take  20 mg by mouth at bedtime.   . gabapentin (NEURONTIN) 300 MG capsule Take 600 mg by mouth at bedtime.  Marland Kitchen ipratropium-albuterol (DUONEB) 0.5-2.5 (3) MG/3ML SOLN Take 3 mLs by nebulization every 4 (four) hours as needed. DX: J44.9 COPD  . ketorolac (TORADOL) 10 MG tablet Take 10 mg by mouth every 6 (six) hours as needed for moderate pain.  Marland Kitchen MAGNESIUM CITRATE PO Take 3 capsules by mouth daily.  . Melatonin 10 MG TABS Take 30  mg by mouth at bedtime.  . nitroGLYCERIN (NITROSTAT) 0.4 MG SL tablet Place 0.4 mg under the tongue every 5 (five) minutes as needed for chest pain.  Marland Kitchen NURTEC 75 MG TBDP Place 75 mg under the tongue daily as needed (migraine).   Marland Kitchen oxyCODONE-acetaminophen (PERCOCET/ROXICET) 5-325 MG tablet Take 1-2 tablets by mouth every 4 (four) hours as needed for severe pain.  . pantoprazole (PROTONIX) 40 MG tablet TAKE 1 TABLET(40 MG) BY MOUTH DAILY (Patient taking differently: Take 40 mg by mouth daily. )  . POTASSIUM PO Take 1 tablet by mouth daily.  . promethazine (PHENERGAN) 25 MG tablet Take 25 mg by mouth every 6 (six) hours as needed for nausea or vomiting.  . pyridOXINE (VITAMIN B-6) 100 MG tablet Take 100 mg by mouth at bedtime.  . Saw Palmetto 450 MG CAPS Take 900 mg by mouth daily.   . Tiotropium Bromide Monohydrate (SPIRIVA RESPIMAT) 1.25 MCG/ACT AERS Inhale 2 puffs into the lungs 2 (two) times daily. (Patient taking differently: Inhale 2 puffs into the lungs every morning. )  . zonisamide (ZONEGRAN) 100 MG capsule Take 300 mg by mouth at bedtime.  . Tiotropium Bromide Monohydrate (SPIRIVA RESPIMAT) 2.5 MCG/ACT AERS Inhale 2 puffs into the lungs daily.   No facility-administered encounter medications on file as of 03/04/2020.     Review of Systems  Review of Systems  Constitutional: Positive for activity change and fatigue. Negative for chills, fever and unexpected weight change.  HENT: Negative for postnasal drip, rhinorrhea, sinus pressure, sinus pain and sore throat.    Eyes: Negative.   Respiratory: Positive for cough and shortness of breath. Negative for wheezing.   Cardiovascular: Negative for chest pain and palpitations.  Gastrointestinal: Negative for constipation, diarrhea, nausea and vomiting.  Endocrine: Negative.   Genitourinary: Negative.   Musculoskeletal: Negative.   Skin: Negative.   Neurological: Negative for dizziness and headaches.  Psychiatric/Behavioral: Negative.  Negative for dysphoric mood. The patient is not nervous/anxious.   All other systems reviewed and are negative.    Physical Exam  BP 114/80   Pulse 76   Temp 98.4 F (36.9 C) (Temporal)   Ht 6' (1.829 m)   Wt 212 lb (96.2 kg)   SpO2 96% Comment: on RA  BMI 28.75 kg/m   Wt Readings from Last 5 Encounters:  03/04/20 212 lb (96.2 kg)  02/07/20 214 lb (97.1 kg)  01/23/20 214 lb (97.1 kg)  01/19/20 221 lb 9 oz (100.5 kg)  01/10/20 223 lb 9.6 oz (101.4 kg)    BMI Readings from Last 5 Encounters:  03/04/20 28.75 kg/m  02/07/20 29.02 kg/m  01/23/20 29.02 kg/m  01/19/20 30.05 kg/m  01/10/20 30.33 kg/m     Physical Exam Vitals and nursing note reviewed.  Constitutional:      General: He is not in acute distress.    Appearance: Normal appearance. He is normal weight.  HENT:     Head: Normocephalic and atraumatic.     Right Ear: Hearing and external ear normal.     Left Ear: Hearing and external ear normal.     Nose: Nose normal. No mucosal edema, congestion or rhinorrhea.     Right Turbinates: Not enlarged.     Left Turbinates: Not enlarged.     Mouth/Throat:     Mouth: Mucous membranes are dry.     Pharynx: Oropharynx is clear. No oropharyngeal exudate.  Eyes:     Pupils: Pupils are equal, round, and reactive to light.  Cardiovascular:  Rate and Rhythm: Normal rate and regular rhythm.     Pulses: Normal pulses.     Heart sounds: Normal heart sounds. No murmur.  Pulmonary:     Effort: Pulmonary effort is normal.     Breath sounds: No  decreased breath sounds, wheezing, rhonchi or rales.  Musculoskeletal:     Cervical back: Normal range of motion.     Right lower leg: No edema.     Left lower leg: No edema.  Lymphadenopathy:     Cervical: No cervical adenopathy.  Skin:    General: Skin is warm and dry.     Capillary Refill: Capillary refill takes less than 2 seconds.     Findings: No erythema or rash.  Neurological:     General: No focal deficit present.     Mental Status: He is alert and oriented to person, place, and time.     Motor: Weakness present.     Coordination: Coordination normal.     Gait: Gait abnormal (Using wheelchair).  Psychiatric:        Mood and Affect: Mood normal.        Behavior: Behavior normal. Behavior is cooperative.        Thought Content: Thought content normal.        Judgment: Judgment normal.       Assessment & Plan:   Chronic obstructive pulmonary disease (HCC) Plan: Trial of Spiriva Respimat 2.5 Hold Spiriva Respimat 1.25 May need to consider repeat pulmonary function testing after patient's upcoming surgeries  OSA (obstructive sleep apnea) Plan: We will clinically monitor patient We will further discuss at next office visit  Abnormal findings on diagnostic imaging of lung Treated recently as a pneumonia in March/2021 in the emergency room visit Status post clindamycin and cefdinir Patient is status post back surgery and developed pneumonia, was never provided incentive spirometer Patient reporting clinical improvement after antibiotic regimen  Plan: We will repeat chest x-ray in 2 weeks We will continue to clinically monitor patient   Current tobacco use Current smokeless tobacco user  Plan: Emphasized importance the patient that he needs to stop using smokeless tobacco  Shortness of breath Patient continues to have ongoing shortness of breath and fatigue This is likely multifactorial given the fact that he has a recent pneumonia, COPD, and recent back  surgeries Vital signs today in office are stable Clinical improvement status post antibiotics  Plan: We will continue to clinically monitor  Physical deconditioning Patient has ongoing physical deconditioning Currently using a walker Status post back surgery  Plan: Offered physical therapy referral today, patient declined Patient will ensure that he get set up with physical therapy after next back surgery  Lobar pneumonia, unspecified organism (HCC) To myosin and cefdinir Reporting clinical improvement  Plan: We will repeat chest x-ray in 2 weeks    Return in about 2 months (around 05/04/2020), or if symptoms worsen or fail to improve, for [facility] - Dr. Belia Heman.   Coral Ceo, NP 03/04/2020   This appointment required 34 minutes of patient care (this includes precharting, chart review, review of results, face-to-face care, etc.).

## 2020-03-04 NOTE — Assessment & Plan Note (Signed)
Treated recently as a pneumonia in March/2021 in the emergency room visit Status post clindamycin and cefdinir Patient is status post back surgery and developed pneumonia, was never provided incentive spirometer Patient reporting clinical improvement after antibiotic regimen  Plan: We will repeat chest x-ray in 2 weeks We will continue to clinically monitor patient

## 2020-03-04 NOTE — Assessment & Plan Note (Signed)
To myosin and cefdinir Reporting clinical improvement  Plan: We will repeat chest x-ray in 2 weeks

## 2020-03-04 NOTE — Assessment & Plan Note (Signed)
Patient has ongoing physical deconditioning Currently using a walker Status post back surgery  Plan: Offered physical therapy referral today, patient declined Patient will ensure that he get set up with physical therapy after next back surgery

## 2020-03-04 NOTE — Assessment & Plan Note (Signed)
Plan: Trial of Spiriva Respimat 2.5 Hold Spiriva Respimat 1.25 May need to consider repeat pulmonary function testing after patient's upcoming surgeries

## 2020-03-04 NOTE — Patient Instructions (Addendum)
You were seen today by Lauraine Rinne, NP  for:   It was good seeing you in office.  I am glad you are feeling better since taking the 2 antibiotics.  We will get a chest x-ray on you in about 2 weeks at [facility]  I have placed that order.  We will also trial you on a stronger dose of Stiolto Respimat to see if this helps you with your breathing.  When you get your second back surgery please ensure that they give you an incentive spirometer and also potentially a flutter valve to help you with clearing mucus and decreasing your chance of developing a pneumonia.  If your symptoms worsen as discussed today or: You develop fever, increased cough, congestion, sputum color changes please contact our office.  We will see you back in the [facility] office in mid to late May/2021 with Dr. Mortimer Fries   Take care and stay safe,  Aaron Edelman  1. Chronic obstructive pulmonary disease, unspecified COPD type (HCC)  Trial Spiriva Respimat 2.5 >>> 2 puffs daily >>> Do this every day >>>This is not a rescue inhaler   HOLD spiriva 1.25  Note your daily symptoms > remember "red flags" for COPD:   >>>Increase in cough >>>increase in sputum production >>>increase in shortness of breath or activity  intolerance.   If you notice these symptoms, please call the office to be seen.    2. Shortness of breath  Glad the symptoms have improved since being treated with antibiotics  3. Abnormal findings on diagnostic imaging of lung  We will get a chest x-ray on you in 2 weeks at [facility]  4. Current tobacco use  We recommend that you stop using smokeless tobacco >>>You need to set a quit date >>>If you have friends or family who smoke, let them know you are trying to quit and not to smoke around you or in your living environment  Smoking Cessation Resources:  1 800 QUIT NOW  >>> Patient to call this resource and utilize it to help support her quit smoking >>> Keep up your hard work with stopping  smoking  You can also contact the [facility] >>>For smoking cessation classes call [phone]  We do not recommend using e-cigarettes as a form of stopping smoking  You can sign up for smoking cessation support texts and information:  >>>https://smokefree.gov/smokefreetxt   Follow Up:    Return in about 2 months (around 05/04/2020), or if symptoms worsen or fail to improve, for [facility] - Dr. Mortimer Fries.   Please do your part to reduce the spread of COVID-19:      Reduce your risk of any infection  and COVID19 by using the similar precautions used for avoiding the common cold or flu:  Marland Kitchen Wash your hands often with soap and warm water for at least 20 seconds.  If soap and water are not readily available, use an alcohol-based hand sanitizer with at least 60% alcohol.  . If coughing or sneezing, cover your mouth and nose by coughing or sneezing into the elbow areas of your shirt or coat, into a tissue or into your sleeve (not your hands). Langley Gauss A MASK when in public  . Avoid shaking hands with others and consider head nods or verbal greetings only. . Avoid touching your eyes, nose, or mouth with unwashed hands.  . Avoid close contact with people who are sick. . Avoid places or events with large numbers of people in one  location, like concerts or sporting events. . If you have some symptoms but not all symptoms, continue to monitor at home and seek medical attention if your symptoms worsen. . If you are having a medical emergency, call 911.   ADDITIONAL HEALTHCARE OPTIONS FOR PATIENTS  [facility] / e-Visit: [email or web address]         [facility]: [phone]  [facility]: [phone]                   [facility]: [phone]     It is flu season:   >>> Best ways to protect herself from the flu: Receive the yearly flu vaccine, practice good hand hygiene  washing with soap and also using hand sanitizer when available, eat a nutritious meals, get adequate rest, hydrate appropriately   Please contact the office if your symptoms worsen or you have concerns that you are not improving.   Thank you for choosing [facility] for your healthcare, and for allowing [place] to partner with you on your healthcare journey. I am thankful to be able to provide care to you today.   Elisha Headland FNP-C

## 2020-03-05 ENCOUNTER — Other Ambulatory Visit: Payer: Self-pay

## 2020-03-12 ENCOUNTER — Other Ambulatory Visit: Payer: Self-pay

## 2020-03-12 ENCOUNTER — Encounter: Payer: Self-pay | Admitting: Vascular Surgery

## 2020-03-12 ENCOUNTER — Ambulatory Visit (INDEPENDENT_AMBULATORY_CARE_PROVIDER_SITE_OTHER): Payer: Self-pay | Admitting: Vascular Surgery

## 2020-03-12 VITALS — BP 108/73 | HR 65 | Temp 97.8°F | Resp 20 | Ht 72.0 in | Wt 208.0 lb

## 2020-03-12 DIAGNOSIS — M5137 Other intervertebral disc degeneration, lumbosacral region: Secondary | ICD-10-CM

## 2020-03-12 NOTE — Progress Notes (Signed)
[facility] [place]  Patient name: Cory Espinoza MRN: [id number] DOB: 1971-07-08 Sex: male  REASON FOR CONSULT: Discuss anterior exposure for L5-S1 fusion with Dr. Jordan Likes  HPI: Cory Espinoza is a 49 y.o. male, who is in today for discussion of anterior exposure.  He is here today with his wife.  He has progressive degenerative disc disease make it very difficult for him to walk.  He is currently walking with a walker.  He has been seen by Dr. Jordan Likes who is recommended spinal fusion with anterior approach.  He has had no prior history of intra-abdominal surgery.  He is not obese.  He has no history of peripheral vascular occlusive disease.  Past Medical History:  Diagnosis Date  . Allergy   . Anxiety   . Arthritis    lumbar spine  . Atrial fibrillation (HCC)   . Back pain    Severe Lumbar Pain  . COPD (chronic obstructive pulmonary disease) (HCC)    Restrictive lung disease  . Coronary artery disease   . GERD (gastroesophageal reflux disease)   . Headache    [name], onset October 2020, cluster headaches in the past  . History of kidney stones   . Hyperlipidemia   . Hypertension   . Myocardial infarction (HCC)    at age 55  . Pneumonia   . Sleep apnea    unable to use cpap since onset of migraines    Family History  Problem Relation Age of Onset  . Heart disease Mother   . Heart disease Father   . Heart attack Father 58  . Diabetes Sister   . Hypertension Sister   . Hyperlipidemia Sister   . Hypertension Brother   . Heart attack Brother 45  . Cancer Paternal Grandmother        breast  . Diabetes Paternal Grandmother   . Hyperlipidemia Brother   . Supraventricular tachycardia Brother   . Heart attack Paternal Grandfather 11    SOCIAL HISTORY: Social History   Socioeconomic History  . Marital status: Married    Spouse name: Not on file  . Number of children: Not on file  . Years of education:  Not on file  . Highest education level: Not on file  Occupational History  . Not on file  Tobacco Use  . Smoking status: Former Smoker    Packs/day: 2.00    Years: 23.00    Pack years: 46.00    Types: Cigarettes    Start date: 10/28/1989    Quit date: 10/28/2012    Years since quitting: 7.3  . Smokeless tobacco: Current User    Types: Snuff  Substance and Sexual Activity  . Alcohol use: Yes    Alcohol/week: 0.0 standard drinks    Comment: rarely  . Drug use: No  . Sexual activity: Yes    Partners: Female  Other Topics Concern  . Not on file  Social History Narrative  . Not on file   Social Determinants of Health   Financial Resource Strain:   . Difficulty of Paying Living Expenses:   Food Insecurity:   . Worried About Programme researcher, broadcasting/film/video in the Last Year:   . Barista in the Last Year:   Transportation Needs:   . Freight forwarder (Medical):   Marland Kitchen Lack of Transportation (Non-Medical):   Physical Activity:   . Days of Exercise per Week:   . Minutes of Exercise per Session:  Stress:   . Feeling of Stress :   Social Connections:   . Frequency of Communication with Friends and Family:   . Frequency of Social Gatherings with Friends and Family:   . Attends Religious Services:   . Active Member of Clubs or Organizations:   . Attends Banker Meetings:   Marland Kitchen Marital Status:   Intimate Partner Violence:   . Fear of Current or Ex-Partner:   . Emotionally Abused:   Marland Kitchen Physically Abused:   . Sexually Abused:     Allergies  Allergen Reactions  . Aspirin Anaphylaxis  . Amoxicillin Hives and Itching    Has patient had a PCN reaction causing immediate rash, facial/tongue/throat swelling, SOB or lightheadedness with hypotension: No Has patient had a PCN reaction causing severe rash involving mucus membranes or skin necrosis: No Has patient had a PCN reaction that required hospitalization: No Has patient had a PCN reaction occurring within the last 10  years: No If all of the above answers are "NO", then may proceed with Cephalosporin use.   . Codeine Hives  . Penicillins Hives and Itching    Has patient had a PCN reaction causing immediate rash, facial/tongue/throat swelling, SOB or lightheadedness with hypotension: No Has patient had a PCN reaction causing severe rash involving mucus membranes or skin necrosis: No Has patient had a PCN reaction that required hospitalization: No Has patient had a PCN reaction occurring within the last 10 years: No If all of the above answers are "NO", then may proceed with Cephalosporin use.   Marland Kitchen Hydrocodone-Acetaminophen Itching    Current Outpatient Medications  Medication Sig Dispense Refill  . AIMOVIG 70 MG/ML SOAJ Inject 70 mg into the skin every 28 (twenty-eight) days.    Marland Kitchen albuterol (PROVENTIL HFA;VENTOLIN HFA) 108 (90 Base) MCG/ACT inhaler Inhale 1-2 puffs into the lungs every 4 (four) hours as needed for wheezing or shortness of breath. 1 Inhaler 5  . ALPRAZolam (XANAX) 1 MG tablet Take 1 mg by mouth 3 (three) times daily.    Marland Kitchen atorvastatin (LIPITOR) 40 MG tablet Take 1 tablet (40 mg total) by mouth at bedtime. 90 tablet 1  . B COMPLEX VITAMINS PO Take 1 tablet by mouth daily.     . baclofen (LIORESAL) 10 MG tablet Take 10 mg by mouth 2 (two) times daily as needed for muscle spasms.    . benzonatate (TESSALON) 200 MG capsule Take 1 capsule (200 mg total) by mouth 3 (three) times daily as needed for cough. 30 capsule 2  . cetirizine (ZYRTEC) 10 MG tablet TAKE 1 TABLET(10 MG) BY MOUTH DAILY (Patient taking differently: Take 10 mg by mouth daily. ) 90 tablet 1  . Cholecalciferol (VITAMIN D) 125 MCG (5000 UT) CAPS Take 5,000 Units by mouth daily.    . clopidogrel (PLAVIX) 75 MG tablet Take 1 tablet (75 mg total) by mouth daily. 90 tablet 1  . escitalopram (LEXAPRO) 20 MG tablet Take 20 mg by mouth at bedtime.     Marland Kitchen ipratropium-albuterol (DUONEB) 0.5-2.5 (3) MG/3ML SOLN Take 3 mLs by nebulization  every 4 (four) hours as needed. DX: J44.9 COPD 360 mL 3  . ketorolac (TORADOL) 10 MG tablet Take 10 mg by mouth every 6 (six) hours as needed for moderate pain.    Marland Kitchen MAGNESIUM CITRATE PO Take 3 capsules by mouth daily.    . magnesium oxide (MAG-OX) 400 MG tablet Take by mouth.    . Melatonin 10 MG TABS Take 30 mg by mouth  at bedtime.    . nitroGLYCERIN (NITROSTAT) 0.4 MG SL tablet Place 0.4 mg under the tongue every 5 (five) minutes as needed for chest pain.    Marland Kitchen NURTEC 75 MG TBDP Place 75 mg under the tongue daily as needed (migraine).     . pantoprazole (PROTONIX) 40 MG tablet TAKE 1 TABLET(40 MG) BY MOUTH DAILY (Patient taking differently: Take 40 mg by mouth daily. ) 30 tablet 10  . POTASSIUM PO Take 1 tablet by mouth daily.    . promethazine (PHENERGAN) 25 MG tablet Take 25 mg by mouth every 6 (six) hours as needed for nausea or vomiting.    . pyridOXINE (VITAMIN B-6) 100 MG tablet Take 100 mg by mouth at bedtime.    . pyridOXINE (VITAMIN B-6) 25 MG tablet Take by mouth.    . Saw Palmetto 450 MG CAPS Take 900 mg by mouth daily.     . Tiotropium Bromide Monohydrate (SPIRIVA RESPIMAT) 1.25 MCG/ACT AERS Inhale 2 puffs into the lungs 2 (two) times daily. (Patient taking differently: Inhale 2 puffs into the lungs every morning. ) 1 Inhaler 5  . Tiotropium Bromide Monohydrate (SPIRIVA RESPIMAT) 2.5 MCG/ACT AERS Inhale 2 puffs into the lungs daily. 8 g 0  . topiramate (TOPAMAX) 25 MG tablet Take 50 mg by mouth daily.    Marland Kitchen zonisamide (ZONEGRAN) 100 MG capsule Take 300 mg by mouth at bedtime.     No current facility-administered medications for this visit.    REVIEW OF SYSTEMS:  [X]  denotes positive finding, [ ]  denotes negative finding Cardiac  Comments:  Chest pain or chest pressure:    Shortness of breath upon exertion:    Short of breath when lying flat:    Irregular heart rhythm:        Vascular    Pain in calf, thigh, or hip brought on by ambulation:    Pain in feet at night that  wakes you up from your sleep:     Blood clot in your veins:    Leg swelling:         Pulmonary    Oxygen at home:    Productive cough:     Wheezing:         Neurologic    Sudden weakness in arms or legs:     Sudden numbness in arms or legs:     Sudden onset of difficulty speaking or slurred speech:    Temporary loss of vision in one eye:     Problems with dizziness:         Gastrointestinal    Blood in stool:     Vomited blood:         Genitourinary    Burning when urinating:     Blood in urine:        Psychiatric    Major depression:         Hematologic    Bleeding problems:    Problems with blood clotting too easily:        Skin    Rashes or ulcers:        Constitutional    Fever or chills:      PHYSICAL EXAM: Vitals:   03/12/20 0952  BP: 108/73  Pulse: 65  Resp: 20  Temp: 97.8 F (36.6 C)  SpO2: 99%  Weight: 208 lb (94.3 kg)  Height: 6' (1.829 m)    GENERAL: The patient is a well-nourished male, in no acute distress. The vital signs are documented  above. CARDIOVASCULAR: 2+ femoral pulses bilaterally and palpable dorsalis pedis bilaterally.  Carotid arteries without bruits bilaterally. PULMONARY: There is good air exchange  ABDOMEN: Soft and non-tender  MUSCULOSKELETAL: There are no major deformities or cyanosis. NEUROLOGIC: No focal weakness or paresthesias are detected. SKIN: There are no ulcers or rashes noted. PSYCHIATRIC: The patient has a normal affect.  DATA:  He did have a CT angiogram of his chest 1 month ago to rule out pulmonary embolus.  This showed no evidence of calcification in his thoracic and upper abdominal aorta  MEDICAL ISSUES: I had a long discussion with the patient and his wife present regarding my role for exposure.  Discussed the incision from above the pubic bone from the midline to the left and mobilization of the rectus muscle and extra retroperitoneal exposure.  Also discussed mobilization of the left ureter and arterial  and venous structures overlying the spine.  Discussed potential injury to all of these in particular venous injury.  They understand and wish to proceed.  I do not see any contraindication to surgery   Rosetta Posner, MD [facility] Tel [phone] Pager [phone]

## 2020-03-18 ENCOUNTER — Ambulatory Visit
Admission: RE | Admit: 2020-03-18 | Discharge: 2020-03-18 | Disposition: A | Discharge status: Home / Self Care | Payer: BC Managed Care – PPO | Source: Ambulatory Visit | Attending: Pulmonary Disease | Admitting: Pulmonary Disease

## 2020-03-18 DIAGNOSIS — J181 Lobar pneumonia, unspecified organism: Secondary | ICD-10-CM

## 2020-03-18 DIAGNOSIS — R918 Other nonspecific abnormal finding of lung field: Secondary | ICD-10-CM | POA: Insufficient documentation

## 2020-03-20 NOTE — Pre-Procedure Instructions (Signed)
[id number] [place], [place] - [address] AT [facility] [address] [place] [address] Phone: [phone] Fax: [phone]     Your procedure is scheduled on Monday, April 19th, from 07:30 AM-11:35 AM.  Report to [facility] Main Entrance "A" at 05:30 A.M., and check in at the Admitting office.  Call this number if you have problems the morning of surgery:  [phone]  Call [phone] if you have any questions prior to your surgery date Monday-Friday 8am-4pm.    Remember:  Do not eat or drink after midnight the night before your surgery.     Take these medicines the morning of surgery with A SIP OF WATER : ALPRAZolam (XANAX) atorvastatin (LIPITOR) cetirizine (ZYRTEC) pantoprazole (PROTONIX) promethazine (PHENERGAN) Tiotropium Bromide Monohydrate (SPIRIVA RESPIMAT) inhaler. *Please bring all inhalers with you the day of surgery.*    IF NEEDED: baclofen (LIORESAL) benzonatate (TESSALON) ketorolac (TORADOL) NURTEC nitroGLYCERIN (NITROSTAT) albuterol (PROVENTIL HFA;VENTOLIN HFA) inhaler.  ipratropium-albuterol (DUONEB) breathing treatment   Follow your surgeon's instructions on when to stop clopidogrel (PLAVIX).  If no instructions were given by your surgeon then you will need to call the office to get those instructions.    As of today, STOP taking any Aspirin (unless otherwise instructed by your surgeon) and Aspirin containing products, Aleve, Naproxen, Ibuprofen, Motrin, Advil, Goody's, BC's, all herbal medications, fish oil, and all vitamins.                      Do not wear jewelry.            Do not wear lotions, powders, colognes, or deodorant.            Men may shave face and neck.            Do not bring valuables to the hospital.            [facility] is not responsible for any belongings or valuables.  Do NOT Smoke (Tobacco/Vapping) or drink Alcohol 24 hours prior to your procedure.  If you use a  CPAP at night, you may bring all equipment for your overnight stay.   Contacts, glasses, dentures or bridgework may not be worn into surgery.      For patients admitted to the hospital, discharge time will be determined by your treatment team.   Patients discharged the day of surgery will not be allowed to drive home, and someone needs to stay with them for 24 hours.    Special instructions:   [place]- Preparing For Surgery  Before surgery, you can play an important role. Because skin is not sterile, your skin needs to be as free of germs as possible. You can reduce the number of germs on your skin by washing with CHG (chlorahexidine gluconate) Soap before surgery.  CHG is an antiseptic cleaner which kills germs and bonds with the skin to continue killing germs even after washing.    Oral Hygiene is also important to reduce your risk of infection.  Remember - BRUSH YOUR TEETH THE MORNING OF SURGERY WITH YOUR REGULAR TOOTHPASTE  Please do not use if you have an allergy to CHG or antibacterial soaps. If your skin becomes reddened/irritated stop using the CHG.  Do not shave (including legs and underarms) for at least 48 hours prior to first CHG shower. It is OK to shave your face.  Please follow these instructions carefully.   1. Shower  the NIGHT BEFORE SURGERY and the MORNING OF SURGERY with CHG Soap.   2. If you chose to wash your hair, wash your hair first as usual with your normal shampoo.  3. After you shampoo, rinse your hair and body thoroughly to remove the shampoo.  4. Use CHG as you would any other liquid soap. You can apply CHG directly to the skin and wash gently with a scrungie or a clean washcloth.   5. Apply the CHG Soap to your body ONLY FROM THE NECK DOWN.  Do not use on open wounds or open sores. Avoid contact with your eyes, ears, mouth and genitals (private parts). Wash Face and genitals (private parts)  with your normal soap.   6. Wash thoroughly, paying special  attention to the area where your surgery will be performed.  7. Thoroughly rinse your body with warm water from the neck down.  8. DO NOT shower/wash with your normal soap after using and rinsing off the CHG Soap.  9. Pat yourself dry with a CLEAN TOWEL.  10. Wear CLEAN PAJAMAS to bed the night before surgery, wear comfortable clothes the morning of surgery  11. Place CLEAN SHEETS on your bed the night of your first shower and DO NOT SLEEP WITH PETS.   Day of Surgery:   Do not apply any deodorants/lotions.  Please wear clean clothes to the hospital/surgery center.   Remember to brush your teeth WITH YOUR REGULAR TOOTHPASTE.   Please read over the following fact sheets that you were given.

## 2020-03-21 ENCOUNTER — Other Ambulatory Visit (HOSPITAL_COMMUNITY)
Admission: RE | Admit: 2020-03-21 | Discharge: 2020-03-21 | Disposition: A | Discharge status: Home / Self Care | Payer: BC Managed Care – PPO | Source: Ambulatory Visit | Attending: Neurosurgery | Admitting: Neurosurgery

## 2020-03-21 ENCOUNTER — Encounter (HOSPITAL_COMMUNITY)
Admission: RE | Admit: 2020-03-21 | Discharge: 2020-03-21 | Disposition: A | Discharge status: Home / Self Care | Payer: BC Managed Care – PPO | Source: Ambulatory Visit | Attending: Neurosurgery | Admitting: Neurosurgery

## 2020-03-21 ENCOUNTER — Encounter (HOSPITAL_COMMUNITY): Payer: Self-pay

## 2020-03-21 ENCOUNTER — Other Ambulatory Visit: Payer: Self-pay

## 2020-03-21 DIAGNOSIS — I252 Old myocardial infarction: Secondary | ICD-10-CM | POA: Diagnosis not present

## 2020-03-21 DIAGNOSIS — E785 Hyperlipidemia, unspecified: Secondary | ICD-10-CM | POA: Diagnosis not present

## 2020-03-21 DIAGNOSIS — Z7902 Long term (current) use of antithrombotics/antiplatelets: Secondary | ICD-10-CM | POA: Insufficient documentation

## 2020-03-21 DIAGNOSIS — Z20822 Contact with and (suspected) exposure to covid-19: Secondary | ICD-10-CM | POA: Diagnosis not present

## 2020-03-21 DIAGNOSIS — K219 Gastro-esophageal reflux disease without esophagitis: Secondary | ICD-10-CM | POA: Insufficient documentation

## 2020-03-21 DIAGNOSIS — J449 Chronic obstructive pulmonary disease, unspecified: Secondary | ICD-10-CM | POA: Insufficient documentation

## 2020-03-21 DIAGNOSIS — Z8249 Family history of ischemic heart disease and other diseases of the circulatory system: Secondary | ICD-10-CM | POA: Diagnosis not present

## 2020-03-21 DIAGNOSIS — I10 Essential (primary) hypertension: Secondary | ICD-10-CM | POA: Insufficient documentation

## 2020-03-21 DIAGNOSIS — G4733 Obstructive sleep apnea (adult) (pediatric): Secondary | ICD-10-CM | POA: Insufficient documentation

## 2020-03-21 DIAGNOSIS — Z981 Arthrodesis status: Secondary | ICD-10-CM | POA: Diagnosis not present

## 2020-03-21 DIAGNOSIS — I2510 Atherosclerotic heart disease of native coronary artery without angina pectoris: Secondary | ICD-10-CM | POA: Diagnosis not present

## 2020-03-21 DIAGNOSIS — Z01818 Encounter for other preprocedural examination: Secondary | ICD-10-CM | POA: Insufficient documentation

## 2020-03-21 DIAGNOSIS — Z87891 Personal history of nicotine dependence: Secondary | ICD-10-CM | POA: Diagnosis not present

## 2020-03-21 HISTORY — DX: Other disorders of lung: J98.4

## 2020-03-21 HISTORY — DX: Cardiac arrhythmia, unspecified: I49.9

## 2020-03-21 HISTORY — DX: Heart failure, unspecified: I50.9

## 2020-03-21 HISTORY — DX: Angina pectoris, unspecified: I20.9

## 2020-03-21 HISTORY — DX: Dyspnea, unspecified: R06.00

## 2020-03-21 HISTORY — DX: Unspecified asthma, uncomplicated: J45.909

## 2020-03-21 LAB — CBC WITH DIFFERENTIAL/PLATELET
Abs Immature Granulocytes: 0.02 10*3/uL (ref 0.00–0.07)
Basophils Absolute: 0 10*3/uL (ref 0.0–0.1)
Basophils Relative: 1 %
Eosinophils Absolute: 0.1 10*3/uL (ref 0.0–0.5)
Eosinophils Relative: 1 %
HCT: 48.3 % (ref 39.0–52.0)
Hemoglobin: 16 g/dL (ref 13.0–17.0)
Immature Granulocytes: 0 %
Lymphocytes Relative: 34 %
Lymphs Abs: 2 10*3/uL (ref 0.7–4.0)
MCH: 32.2 pg (ref 26.0–34.0)
MCHC: 33.1 g/dL (ref 30.0–36.0)
MCV: 97.2 fL (ref 80.0–100.0)
Monocytes Absolute: 0.4 10*3/uL (ref 0.1–1.0)
Monocytes Relative: 6 %
Neutro Abs: 3.4 10*3/uL (ref 1.7–7.7)
Neutrophils Relative %: 58 %
Platelets: 201 10*3/uL (ref 150–400)
RBC: 4.97 MIL/uL (ref 4.22–5.81)
RDW: 12.7 % (ref 11.5–15.5)
WBC: 5.9 10*3/uL (ref 4.0–10.5)
nRBC: 0 % (ref 0.0–0.2)

## 2020-03-21 LAB — ABO/RH: ABO/RH(D): A POS

## 2020-03-21 LAB — BASIC METABOLIC PANEL
Anion gap: 7 (ref 5–15)
BUN: 14 mg/dL (ref 6–20)
CO2: 24 mmol/L (ref 22–32)
Calcium: 8.9 mg/dL (ref 8.9–10.3)
Chloride: 107 mmol/L (ref 98–111)
Creatinine, Ser: 0.88 mg/dL (ref 0.61–1.24)
GFR calc Af Amer: >60 mL/min (ref >60)
GFR calc non Af Amer: >60 mL/min (ref >60)
Glucose, Bld: 97 mg/dL (ref 70–99)
Potassium: 4.1 mmol/L (ref 3.5–5.1)
Sodium: 138 mmol/L (ref 135–145)

## 2020-03-21 LAB — TYPE AND SCREEN
ABO/RH(D): A POS
Antibody Screen: NEGATIVE

## 2020-03-21 LAB — SARS CORONAVIRUS 2 (TAT 6-24 HRS): SARS Coronavirus 2: NEGATIVE

## 2020-03-21 LAB — SURGICAL PCR SCREEN
MRSA, PCR: NEGATIVE
Staphylococcus aureus: NEGATIVE

## 2020-03-21 NOTE — Progress Notes (Signed)
PCP - Jerl Mina, MD Cardiologist - Julien Nordmann, MD Neurologist- Cristopher Peru, MD Pulmonology- Coral Ceo, NP; Erin Fulling, MD  PPM/ICD - Denies  Chest x-ray - 03/18/20 EKG - 02/07/20 Stress Test - Per patient's wife> 5 years ago, nuclear stress test, results were negative. ECHO - 03/19/05 Cardiac Cath - 04/27/16  Sleep Study - Yes, 2012 CPAP - Patient states he is unable to wear @ HS due to migraines.  Patient denies being a diabetic.   Blood Thinner Instructions: Per patient's wife, stop 7 days prior to sx, last dose of Plavix 03/24/20. Aspirin Instructions: N/A  ERAS Protcol - N/A PRE-SURGERY Ensure or G2- N/A  COVID TEST- 03/21/20 @ 1430 @ [facility]   Anesthesia review: Yes, cardiac hx.   Patient denies shortness of breath, fever, cough and chest pain at PAT appointment   All instructions explained to the patient, with a verbal understanding of the material. Patient agrees to go over the instructions while at home for a better understanding. Patient also instructed to self quarantine after being tested for COVID-19. The opportunity to ask questions was provided.

## 2020-03-22 NOTE — Progress Notes (Signed)
Anesthesia Chart Review:  History includes former smoker (quit 10/08/12), [name] (reported MI age 49, although not mentioned in cardiology notes, does known [name] with strong family history of MI < 48 years old; on Plavix for reduction of cardiovascular event), afib episode (2005), HTN, HLD, COPD, GERD, OSA (intolerant to CPAP due to headaches), anxiety, headaches, spinal surgery (1998).   Recently underwent ACDF by Dr. Jordan Likes  01/23/20. Prior to that surgery he had clearance from cardiology and pulmonology. Clearances are copied below. He underwent surgery uneventfully but did develop PNA postoperatively shortly after discharge. He was treated with antibiotics and followed up with pulmonology 03/04/20 and reported improvement of symptoms. F/u CXR on 4/12 showed resolution of PNA.   - Pulmonology preoperative evaluation on 01/12/20 by Ames Dura, NP. She wrote, "Patient has hx of small airway disease with obstruction. Breathing is stable with no recent exacerbations or acute respiratory symptoms. Continue Spiriva and prn albuterol. Recommend using bronchodilators post op and incentive spirometry. He is mild-moderate risk for prolonged mech ventilation d.t underlying sleep apnea and COPD, discussed these risk with patient/wife. Cleared by pulmonary for surgery.... Recommend 1. Short duration of surgery as much as possible and avoid paralytic if possible 2. Recovery in step down or ICU with Pulmonary consultation 3. DVT prophylaxis 4. Aggressive pulmonary toilet with o2, bronchodilatation, and incentive spirometry and early ambulation". Since he is currently intolerant to CPAP, she also recommended O2 post-operatively and CPAP available if needed.  - Cardiology preoperative input outlined on 01/15/20 by Marjie Skiff, PA-C, "...Given past medical history and time since last visit, based on ACC/AHA guidelines,Cory D Jenkinswould be at acceptable risk for the planned procedure without further  cardiovascular testing.  At time of recent cervical ESI, Dr. Mariah Milling stated patient was OK to hold Plavix for 5 days prior to procedure. Same recommendation applies to this neck surgery. Plavix should be restarted as soon as able following procedure." Last Plavix 01/16/20.  He has been seeing neurology and physiatry for severe migraines and cognitive decline.  Preop labs reviewed, WNL.   EKG 02/07/20 (in setting of PNA): Sinus tach. Rate 102. Nonspecific ST abnormality.   CHEST - 2 VIEW 03/18/20:  COMPARISON:  02/07/2020  FINDINGS: Cardiac and mediastinal contours normal. Vascularity normal. Lung bases now clear. No focal infiltrate or effusion.  IMPRESSION: No active cardiopulmonary disease.   PFT 12/16/17 "Small airways obstruction with restrictive lung disease from increased weight FEF 25/75 reduced to 66% predicted Fev1/FVC ratio WNL FVC reduced TLC reduced DLCO NL when corrected for volumes" FVC 3.04 (57%), FEV1 2.52 (58%), TLC 4.75 (64%)  Cardiac cath/FFR 04/27/16:  Suezanne Jacquet 2nd Mrg lesion, 75% stenosed.  Mid Cx lesion, 70% stenosed. Assessment - The patient has had progressive canadian class 4anginal symptoms with a risk factors including high blood pressure and high cholesterol. - Normal left ventricular function with ejection fraction of 60% - Severe1vessel coronary artery disease  - There issignificant stenosis of 1st and 2nd obtuse marginal Plan Continue medical management of [name] risk factors, Consider PCI and stent placement in culprit artery and Additional medications for management of anginaand will proceed to FFR for further evaluation  Ost RPDA lesion, 60% stenosed. The lesion was not previously treated.  Dist RCA lesion, 50% stenosed. The lesion was not previously treated.  negative FFR of AV groove lesion in OM1 ostial lesion   Zannie Cove [facility] Short Stay Center/Anesthesiology Phone [phone] 03/22/2020 10:34 AM

## 2020-03-22 NOTE — Anesthesia Preprocedure Evaluation (Addendum)
Anesthesia Evaluation  Patient identified by MRN, date of birth, ID band Patient awake    Reviewed: Allergy & Precautions, H&P , NPO status , Patient's Chart, lab work & pertinent test results  Airway Mallampati: III  TM Distance: >3 FB Neck ROM: Limited    Dental no notable dental hx. (+) Teeth Intact, Dental Advisory Given   Pulmonary asthma , sleep apnea , COPD, former smoker,    Pulmonary exam normal breath sounds clear to auscultation       Cardiovascular Exercise Tolerance: Good hypertension, + [name], + Past MI and +CHF  + dysrhythmias Atrial Fibrillation  Rhythm:Regular Rate:Normal     Neuro/Psych  Headaches, Anxiety    GI/Hepatic Neg liver ROS, GERD  ,  Endo/Other  negative endocrine ROS  Renal/GU negative Renal ROS  negative genitourinary   Musculoskeletal  (+) Arthritis , Osteoarthritis,    Abdominal   Peds  Hematology negative hematology ROS (+)   Anesthesia Other Findings   Reproductive/Obstetrics negative OB ROS                           Anesthesia Physical Anesthesia Plan  ASA: III  Anesthesia Plan: General   Post-op Pain Management:    Induction: Intravenous  PONV Risk Score and Plan: 3 and Ondansetron, Dexamethasone and Midazolam  Airway Management Planned: Oral ETT and Video Laryngoscope Planned  Additional Equipment: Arterial line  Intra-op Plan:   Post-operative Plan: Extubation in OR  Informed Consent: I have reviewed the patients History and Physical, chart, labs and discussed the procedure including the risks, benefits and alternatives for the proposed anesthesia with the patient or authorized representative who has indicated his/her understanding and acceptance.     Dental advisory given  Plan Discussed with: CRNA  Anesthesia Plan Comments: (History includes former smoker (quit 10/08/12), [name] (reported MI age 49, although not mentioned in cardiology  notes, does known [name] with strong family history of MI < 97 years old; on Plavix for reduction of cardiovascular event), afib episode (2005), HTN, HLD, COPD, GERD, OSA (intolerant to CPAP due to headaches), anxiety, headaches, spinal surgery (1998).   Recently underwent ACDF by Dr. Jordan Likes  01/23/20. Prior to that surgery he had clearance from cardiology and pulmonology. Clearances are copied below. He underwent surgery uneventfully but did develop PNA postoperatively shortly after discharge. He was treated with antibiotics and followed up with pulmonology 03/04/20 and reported improvement of symptoms. F/u CXR on 4/12 showed resolution of PNA.   - Pulmonology preoperative evaluation on 01/12/20 by Ames Dura, NP. She wrote, "Patient has hx of small airway disease with obstruction. Breathing is stable with no recent exacerbations or acute respiratory symptoms. Continue Spiriva and prn albuterol. Recommend using bronchodilators post op and incentive spirometry. He is mild-moderate risk for prolonged mech ventilation d.t underlying sleep apnea and COPD, discussed these risk with patient/wife. Cleared by pulmonary for surgery.... Recommend 1. Short duration of surgery as much as possible and avoid paralytic if possible 2. Recovery in step down or ICU with Pulmonary consultation 3. DVT prophylaxis 4. Aggressive pulmonary toilet with o2, bronchodilatation, and incentive spirometry and early ambulation". Since he is currently intolerant to CPAP, she also recommended O2 post-operatively and CPAP available if needed.  - Cardiology preoperative input outlined on 01/15/20 by Marjie Skiff, PA-C, "...Given past medical history and time since last visit, based on ACC/AHA guidelines,Cory D Jenkinswould be at acceptable risk for the planned procedure without further cardiovascular testing.  At  time of recent cervical ESI, Dr. Rockey Situ stated patient was OK to hold Plavix for 5 days prior to procedure. Same  recommendation applies to this neck surgery. Plavix should be restarted as soon as able following procedure." Last Plavix 01/16/20.  He has been seeing neurology and physiatry for severe migraines and cognitive decline.  Preop labs reviewed, WNL.   EKG 02/07/20 (in setting of PNA): Sinus tach. Rate 102. Nonspecific ST abnormality.   CHEST - 2 VIEW 03/18/20:  COMPARISON:  02/07/2020  FINDINGS: Cardiac and mediastinal contours normal. Vascularity normal. Lung bases now clear. No focal infiltrate or effusion.  IMPRESSION: No active cardiopulmonary disease.   PFT 12/16/17 "Small airways obstruction with restrictive lung disease from increased weight FEF 25/75 reduced to 66% predicted Fev1/FVC ratio WNL FVC reduced TLC reduced DLCO NL when corrected for volumes" FVC 3.04 (57%), FEV1 2.52 (58%), TLC 4.75 (64%)  Cardiac cath/FFR 04/27/16: Colon Flattery 2nd Mrg lesion, 75% stenosed. Mid Cx lesion, 70% stenosed. Assessment - The patient has had progressive canadian class 4anginal symptoms with a risk factors including high blood pressure and high cholesterol. - Normal left ventricular function with ejection fraction of 60% - Severe1vessel coronary artery disease  - There issignificant stenosis of 1st and 2nd obtuse marginal Plan Continue medical management of [name] risk factors, Consider PCI and stent placement in culprit artery and Additional medications for management of anginaand will proceed to FFR for further evaluation Ost RPDA lesion, 60% stenosed. The lesion was not previously treated. Dist RCA lesion, 50% stenosed. The lesion was not previously treated. negative FFR of AV groove lesion in OM1 ostial lesion )      Anesthesia Quick Evaluation

## 2020-03-25 ENCOUNTER — Encounter (HOSPITAL_COMMUNITY): Payer: Self-pay | Admitting: Neurosurgery

## 2020-03-25 ENCOUNTER — Inpatient Hospital Stay (HOSPITAL_COMMUNITY): Payer: BC Managed Care – PPO | Admitting: Physician Assistant

## 2020-03-25 ENCOUNTER — Inpatient Hospital Stay (HOSPITAL_COMMUNITY): Payer: BC Managed Care – PPO

## 2020-03-25 ENCOUNTER — Encounter (HOSPITAL_COMMUNITY)
Admission: RE | Disposition: A | Discharge status: Home Health | Payer: Self-pay | Source: Home / Self Care | Attending: Neurosurgery

## 2020-03-25 ENCOUNTER — Inpatient Hospital Stay (HOSPITAL_COMMUNITY)
Admission: RE | Admit: 2020-03-25 | Discharge: 2020-03-26 | DRG: 460 | Disposition: A | Discharge status: Home Health | Payer: BC Managed Care – PPO | Attending: Neurosurgery | Admitting: Neurosurgery

## 2020-03-25 DIAGNOSIS — Z7902 Long term (current) use of antithrombotics/antiplatelets: Secondary | ICD-10-CM | POA: Diagnosis not present

## 2020-03-25 DIAGNOSIS — Z87442 Personal history of urinary calculi: Secondary | ICD-10-CM | POA: Diagnosis not present

## 2020-03-25 DIAGNOSIS — Z87891 Personal history of nicotine dependence: Secondary | ICD-10-CM | POA: Diagnosis not present

## 2020-03-25 DIAGNOSIS — M4807 Spinal stenosis, lumbosacral region: Secondary | ICD-10-CM | POA: Diagnosis present

## 2020-03-25 DIAGNOSIS — Z888 Allergy status to other drugs, medicaments and biological substances status: Secondary | ICD-10-CM | POA: Diagnosis not present

## 2020-03-25 DIAGNOSIS — Z79890 Hormone replacement therapy (postmenopausal): Secondary | ICD-10-CM

## 2020-03-25 DIAGNOSIS — Z885 Allergy status to narcotic agent status: Secondary | ICD-10-CM | POA: Diagnosis not present

## 2020-03-25 DIAGNOSIS — Z8249 Family history of ischemic heart disease and other diseases of the circulatory system: Secondary | ICD-10-CM

## 2020-03-25 DIAGNOSIS — Z419 Encounter for procedure for purposes other than remedying health state, unspecified: Secondary | ICD-10-CM

## 2020-03-25 DIAGNOSIS — Z981 Arthrodesis status: Secondary | ICD-10-CM

## 2020-03-25 DIAGNOSIS — Z79899 Other long term (current) drug therapy: Secondary | ICD-10-CM

## 2020-03-25 DIAGNOSIS — G8929 Other chronic pain: Secondary | ICD-10-CM | POA: Diagnosis present

## 2020-03-25 DIAGNOSIS — I2510 Atherosclerotic heart disease of native coronary artery without angina pectoris: Secondary | ICD-10-CM | POA: Diagnosis present

## 2020-03-25 DIAGNOSIS — Z833 Family history of diabetes mellitus: Secondary | ICD-10-CM

## 2020-03-25 DIAGNOSIS — Z886 Allergy status to analgesic agent status: Secondary | ICD-10-CM

## 2020-03-25 DIAGNOSIS — Z83438 Family history of other disorder of lipoprotein metabolism and other lipidemia: Secondary | ICD-10-CM | POA: Diagnosis not present

## 2020-03-25 DIAGNOSIS — M4317 Spondylolisthesis, lumbosacral region: Principal | ICD-10-CM | POA: Diagnosis present

## 2020-03-25 DIAGNOSIS — M5117 Intervertebral disc disorders with radiculopathy, lumbosacral region: Secondary | ICD-10-CM | POA: Diagnosis not present

## 2020-03-25 DIAGNOSIS — Z880 Allergy status to penicillin: Secondary | ICD-10-CM

## 2020-03-25 DIAGNOSIS — J449 Chronic obstructive pulmonary disease, unspecified: Secondary | ICD-10-CM | POA: Diagnosis present

## 2020-03-25 DIAGNOSIS — M5137 Other intervertebral disc degeneration, lumbosacral region: Secondary | ICD-10-CM | POA: Diagnosis present

## 2020-03-25 DIAGNOSIS — M4310 Spondylolisthesis, site unspecified: Secondary | ICD-10-CM | POA: Diagnosis present

## 2020-03-25 HISTORY — PX: ANTERIOR LUMBAR FUSION: SHX1170

## 2020-03-25 HISTORY — PX: ABDOMINAL EXPOSURE: SHX5708

## 2020-03-25 SURGERY — ANTERIOR LUMBAR FUSION 1 LEVEL
Anesthesia: General | Site: Spine Lumbar

## 2020-03-25 MED ORDER — ATORVASTATIN CALCIUM 40 MG PO TABS
40.0000 mg | ORAL_TABLET | Freq: Every day | ORAL | Status: DC
  Administered 2020-03-26: 40 mg via ORAL
  Filled 2020-03-25: qty 1

## 2020-03-25 MED ORDER — SUGAMMADEX SODIUM 200 MG/2ML IV SOLN
INTRAVENOUS | Status: DC | PRN
  Administered 2020-03-25: 200 mg via INTRAVENOUS

## 2020-03-25 MED ORDER — FLEET ENEMA 7-19 GM/118ML RE ENEM: 1.0000 | ENEMA | Freq: Once | RECTAL | Status: DC | PRN

## 2020-03-25 MED ORDER — PROMETHAZINE HCL 25 MG PO TABS: 25.0000 mg | ORAL_TABLET | Freq: Three times a day (TID) | ORAL | Status: DC | PRN

## 2020-03-25 MED ORDER — PROPOFOL 10 MG/ML IV BOLUS
INTRAVENOUS | Status: AC
  Filled 2020-03-25: qty 40

## 2020-03-25 MED ORDER — NITROGLYCERIN 0.4 MG SL SUBL: 0.4000 mg | SUBLINGUAL_TABLET | SUBLINGUAL | Status: DC | PRN

## 2020-03-25 MED ORDER — ACETAMINOPHEN 650 MG RE SUPP: 650.0000 mg | RECTAL | Status: DC | PRN

## 2020-03-25 MED ORDER — MAGNESIUM OXIDE 400 (241.3 MG) MG PO TABS
400.0000 mg | ORAL_TABLET | Freq: Every day | ORAL | Status: DC
  Administered 2020-03-26: 400 mg via ORAL
  Filled 2020-03-25: qty 1

## 2020-03-25 MED ORDER — SODIUM CHLORIDE 0.9% FLUSH: 3.0000 mL | INTRAVENOUS | Status: DC | PRN

## 2020-03-25 MED ORDER — MIDAZOLAM HCL 5 MG/5ML IJ SOLN
INTRAMUSCULAR | Status: DC | PRN
  Administered 2020-03-25: 2 mg via INTRAVENOUS

## 2020-03-25 MED ORDER — BUPIVACAINE HCL (PF) 0.25 % IJ SOLN
INTRAMUSCULAR | Status: DC | PRN
  Administered 2020-03-25: 20 mL

## 2020-03-25 MED ORDER — HYDROCODONE-ACETAMINOPHEN 10-325 MG PO TABS: 1.0000 | ORAL_TABLET | ORAL | Status: DC | PRN

## 2020-03-25 MED ORDER — BENZONATATE 100 MG PO CAPS: 200.0000 mg | ORAL_CAPSULE | Freq: Three times a day (TID) | ORAL | Status: DC | PRN

## 2020-03-25 MED ORDER — OXYCODONE HCL 5 MG PO TABS
10.0000 mg | ORAL_TABLET | ORAL | Status: DC | PRN
  Administered 2020-03-25 – 2020-03-26 (×8): 10 mg via ORAL
  Filled 2020-03-25 (×8): qty 2

## 2020-03-25 MED ORDER — CHLORHEXIDINE GLUCONATE CLOTH 2 % EX PADS: 6.0000 | MEDICATED_PAD | Freq: Once | CUTANEOUS | Status: DC

## 2020-03-25 MED ORDER — SODIUM CHLORIDE 0.9% FLUSH
3.0000 mL | Freq: Two times a day (BID) | INTRAVENOUS | Status: DC
  Administered 2020-03-25: 3 mL via INTRAVENOUS

## 2020-03-25 MED ORDER — ONDANSETRON HCL 4 MG PO TABS: 4.0000 mg | ORAL_TABLET | Freq: Four times a day (QID) | ORAL | Status: DC | PRN

## 2020-03-25 MED ORDER — RIMEGEPANT SULFATE 75 MG PO TBDP: 75.0000 mg | ORAL_TABLET | Freq: Every day | ORAL | Status: DC | PRN

## 2020-03-25 MED ORDER — VANCOMYCIN HCL IN DEXTROSE 1-5 GM/200ML-% IV SOLN
1000.0000 mg | Freq: Once | INTRAVENOUS | Status: AC
  Administered 2020-03-25: 1000 mg via INTRAVENOUS
  Filled 2020-03-25: qty 200

## 2020-03-25 MED ORDER — PROMETHAZINE HCL 25 MG PO TABS: 25.0000 mg | ORAL_TABLET | Freq: Every day | ORAL | Status: DC

## 2020-03-25 MED ORDER — HYDROMORPHONE HCL 1 MG/ML IJ SOLN
INTRAMUSCULAR | Status: AC
  Filled 2020-03-25: qty 1

## 2020-03-25 MED ORDER — POTASSIUM 99 MG PO TABS: 99.0000 mg | ORAL_TABLET | Freq: Every day | ORAL | Status: DC

## 2020-03-25 MED ORDER — LACTATED RINGERS IV SOLN: INTRAVENOUS | Status: DC | PRN

## 2020-03-25 MED ORDER — CHLORHEXIDINE GLUCONATE 4 % EX LIQD: 60.0000 mL | Freq: Once | CUTANEOUS | Status: DC

## 2020-03-25 MED ORDER — VITAMIN D 125 MCG (5000 UT) PO CAPS: 5000.0000 [IU] | ORAL_CAPSULE | Freq: Every day | ORAL | Status: DC

## 2020-03-25 MED ORDER — CLOPIDOGREL BISULFATE 75 MG PO TABS: 75.0000 mg | ORAL_TABLET | Freq: Every day | ORAL | Status: DC

## 2020-03-25 MED ORDER — IPRATROPIUM-ALBUTEROL 0.5-2.5 (3) MG/3ML IN SOLN: 3.0000 mL | RESPIRATORY_TRACT | Status: DC | PRN

## 2020-03-25 MED ORDER — PHENYLEPHRINE HCL-NACL 20-0.9 MG/250ML-% IV SOLN
INTRAVENOUS | Status: DC | PRN
  Administered 2020-03-25: 25 ug/min via INTRAVENOUS
  Administered 2020-03-25: 10 ug/min via INTRAVENOUS

## 2020-03-25 MED ORDER — PROBIOTIC DAILY PO CAPS: ORAL_CAPSULE | Freq: Every day | ORAL | Status: DC

## 2020-03-25 MED ORDER — ALPRAZOLAM 1 MG PO TABS: 1.0000 mg | ORAL_TABLET | ORAL | Status: DC

## 2020-03-25 MED ORDER — PANTOPRAZOLE SODIUM 40 MG PO TBEC
40.0000 mg | DELAYED_RELEASE_TABLET | Freq: Every day | ORAL | Status: DC
  Administered 2020-03-26: 40 mg via ORAL
  Filled 2020-03-25: qty 1

## 2020-03-25 MED ORDER — ACETAMINOPHEN 325 MG PO TABS
650.0000 mg | ORAL_TABLET | ORAL | Status: DC | PRN
  Administered 2020-03-25 – 2020-03-26 (×2): 650 mg via ORAL
  Filled 2020-03-25 (×2): qty 2

## 2020-03-25 MED ORDER — VITAMIN B-6 100 MG PO TABS
100.0000 mg | ORAL_TABLET | Freq: Every day | ORAL | Status: DC
  Administered 2020-03-26: 100 mg via ORAL
  Filled 2020-03-25: qty 1

## 2020-03-25 MED ORDER — ONDANSETRON HCL 4 MG/2ML IJ SOLN
INTRAMUSCULAR | Status: DC | PRN
  Administered 2020-03-25: 4 mg via INTRAVENOUS

## 2020-03-25 MED ORDER — ALBUTEROL SULFATE HFA 108 (90 BASE) MCG/ACT IN AERS: 1.0000 | INHALATION_SPRAY | RESPIRATORY_TRACT | Status: DC | PRN

## 2020-03-25 MED ORDER — LORATADINE 10 MG PO TABS
10.0000 mg | ORAL_TABLET | Freq: Every day | ORAL | Status: DC
  Administered 2020-03-26: 10 mg via ORAL
  Filled 2020-03-25: qty 1

## 2020-03-25 MED ORDER — 0.9 % SODIUM CHLORIDE (POUR BTL) OPTIME
TOPICAL | Status: DC | PRN
  Administered 2020-03-25: 1000 mL

## 2020-03-25 MED ORDER — THROMBIN 20000 UNITS EX SOLR
CUTANEOUS | Status: DC | PRN
  Administered 2020-03-25: 20 mL via TOPICAL

## 2020-03-25 MED ORDER — ESCITALOPRAM OXALATE 20 MG PO TABS
20.0000 mg | ORAL_TABLET | Freq: Every day | ORAL | Status: DC
  Administered 2020-03-25: 20 mg via ORAL
  Filled 2020-03-25 (×2): qty 1

## 2020-03-25 MED ORDER — HYDROMORPHONE HCL 1 MG/ML IJ SOLN: 1.0000 mg | INTRAMUSCULAR | Status: DC | PRN

## 2020-03-25 MED ORDER — HYDROMORPHONE HCL 1 MG/ML IJ SOLN
0.5000 mg | INTRAMUSCULAR | Status: DC | PRN
  Administered 2020-03-25 (×2): 0.5 mg via INTRAVENOUS

## 2020-03-25 MED ORDER — ALPRAZOLAM 0.5 MG PO TABS
2.0000 mg | ORAL_TABLET | Freq: Every day | ORAL | Status: DC
  Administered 2020-03-26: 2 mg via ORAL
  Filled 2020-03-25: qty 4

## 2020-03-25 MED ORDER — FENTANYL CITRATE (PF) 250 MCG/5ML IJ SOLN
INTRAMUSCULAR | Status: AC
  Filled 2020-03-25: qty 5

## 2020-03-25 MED ORDER — ALPRAZOLAM 0.5 MG PO TABS
1.0000 mg | ORAL_TABLET | Freq: Every day | ORAL | Status: DC
  Administered 2020-03-25: 1 mg via ORAL
  Filled 2020-03-25: qty 2

## 2020-03-25 MED ORDER — PHENOL 1.4 % MT LIQD: 1.0000 | OROMUCOSAL | Status: DC | PRN

## 2020-03-25 MED ORDER — ACETAMINOPHEN 500 MG PO TABS: 1000.0000 mg | ORAL_TABLET | Freq: Once | ORAL | Status: AC

## 2020-03-25 MED ORDER — DEXAMETHASONE SODIUM PHOSPHATE 10 MG/ML IJ SOLN
INTRAMUSCULAR | Status: AC
  Filled 2020-03-25: qty 1

## 2020-03-25 MED ORDER — VANCOMYCIN HCL IN DEXTROSE 1-5 GM/200ML-% IV SOLN
1000.0000 mg | INTRAVENOUS | Status: AC
  Administered 2020-03-25: 08:00:00 1000 mg via INTRAVENOUS

## 2020-03-25 MED ORDER — ACETAMINOPHEN 500 MG PO TABS
ORAL_TABLET | ORAL | Status: AC
  Administered 2020-03-25: 1000 mg via ORAL
  Filled 2020-03-25: qty 2

## 2020-03-25 MED ORDER — ZONISAMIDE 100 MG PO CAPS
400.0000 mg | ORAL_CAPSULE | Freq: Every day | ORAL | Status: DC
  Administered 2020-03-25: 400 mg via ORAL
  Filled 2020-03-25 (×2): qty 4

## 2020-03-25 MED ORDER — B COMPLEX VITAMINS PO CAPS: 1.0000 | ORAL_CAPSULE | Freq: Every day | ORAL | Status: DC

## 2020-03-25 MED ORDER — BUPIVACAINE HCL (PF) 0.25 % IJ SOLN
INTRAMUSCULAR | Status: AC
  Filled 2020-03-25: qty 30

## 2020-03-25 MED ORDER — MELATONIN 3 MG PO TABS: 30.0000 mg | ORAL_TABLET | Freq: Every evening | ORAL | Status: DC | PRN

## 2020-03-25 MED ORDER — SODIUM CHLORIDE 0.9 % IV SOLN
250.0000 mL | INTRAVENOUS | Status: DC
  Administered 2020-03-25: 250 mL via INTRAVENOUS

## 2020-03-25 MED ORDER — MENTHOL 3 MG MT LOZG: 1.0000 | LOZENGE | OROMUCOSAL | Status: DC | PRN

## 2020-03-25 MED ORDER — UMECLIDINIUM BROMIDE 62.5 MCG/INH IN AEPB
1.0000 | INHALATION_SPRAY | Freq: Every day | RESPIRATORY_TRACT | Status: DC
  Administered 2020-03-26: 1 via RESPIRATORY_TRACT
  Filled 2020-03-25: qty 7

## 2020-03-25 MED ORDER — SUCCINYLCHOLINE CHLORIDE 20 MG/ML IJ SOLN
INTRAMUSCULAR | Status: DC | PRN
  Administered 2020-03-25: 100 mg via INTRAVENOUS

## 2020-03-25 MED ORDER — HYDROMORPHONE HCL 1 MG/ML IJ SOLN: 0.2500 mg | INTRAMUSCULAR | Status: DC | PRN

## 2020-03-25 MED ORDER — PROPOFOL 10 MG/ML IV BOLUS
INTRAVENOUS | Status: DC | PRN
  Administered 2020-03-25: 160 mg via INTRAVENOUS

## 2020-03-25 MED ORDER — DEXAMETHASONE SODIUM PHOSPHATE 10 MG/ML IJ SOLN
10.0000 mg | Freq: Once | INTRAMUSCULAR | Status: AC
  Administered 2020-03-25: 10 mg via INTRAVENOUS

## 2020-03-25 MED ORDER — BISACODYL 10 MG RE SUPP: 10.0000 mg | Freq: Every day | RECTAL | Status: DC | PRN

## 2020-03-25 MED ORDER — FENTANYL CITRATE (PF) 100 MCG/2ML IJ SOLN
INTRAMUSCULAR | Status: DC | PRN
  Administered 2020-03-25: 100 ug via INTRAVENOUS
  Administered 2020-03-25: 150 ug via INTRAVENOUS

## 2020-03-25 MED ORDER — DIAZEPAM 5 MG PO TABS
5.0000 mg | ORAL_TABLET | Freq: Four times a day (QID) | ORAL | Status: DC | PRN
  Administered 2020-03-25 – 2020-03-26 (×3): 5 mg via ORAL
  Filled 2020-03-25 (×3): qty 1

## 2020-03-25 MED ORDER — TIOTROPIUM BROMIDE MONOHYDRATE 2.5 MCG/ACT IN AERS: 2.0000 | INHALATION_SPRAY | Freq: Every day | RESPIRATORY_TRACT | Status: DC

## 2020-03-25 MED ORDER — THROMBIN 20000 UNITS EX SOLR
CUTANEOUS | Status: AC
  Filled 2020-03-25: qty 20000

## 2020-03-25 MED ORDER — POLYETHYLENE GLYCOL 3350 17 G PO PACK
17.0000 g | PACK | Freq: Every day | ORAL | Status: DC | PRN
  Filled 2020-03-25: qty 1

## 2020-03-25 MED ORDER — SODIUM CHLORIDE 0.9 % IV SOLN
INTRAVENOUS | Status: DC | PRN
  Administered 2020-03-25: 500 mL

## 2020-03-25 MED ORDER — BACLOFEN 10 MG PO TABS: 10.0000 mg | ORAL_TABLET | Freq: Two times a day (BID) | ORAL | Status: DC | PRN

## 2020-03-25 MED ORDER — ONDANSETRON HCL 4 MG/2ML IJ SOLN: 4.0000 mg | Freq: Four times a day (QID) | INTRAMUSCULAR | Status: DC | PRN

## 2020-03-25 MED ORDER — LACTATED RINGERS IV SOLN: INTRAVENOUS | Status: DC

## 2020-03-25 MED ORDER — MIDAZOLAM HCL 2 MG/2ML IJ SOLN
INTRAMUSCULAR | Status: AC
  Filled 2020-03-25: qty 2

## 2020-03-25 MED ORDER — VANCOMYCIN HCL IN DEXTROSE 1-5 GM/200ML-% IV SOLN
INTRAVENOUS | Status: AC
  Filled 2020-03-25: qty 200

## 2020-03-25 MED ORDER — ROCURONIUM BROMIDE 50 MG/5ML IV SOSY
PREFILLED_SYRINGE | INTRAVENOUS | Status: DC | PRN
  Administered 2020-03-25: 50 mg via INTRAVENOUS

## 2020-03-25 SURGICAL SUPPLY — 90 items
ADH SKN CLS APL DERMABOND .7 (GAUZE/BANDAGES/DRESSINGS) ×2
ANCH SPNL 27 LMBR MIS (Anchor) ×6 IMPLANT
ANCHOR LUMBAR 27 (Anchor) ×6 IMPLANT
APL SKNCLS STERI-STRIP NONHPOA (GAUZE/BANDAGES/DRESSINGS) ×2
APPLIER CLIP 11 MED OPEN (CLIP) ×3
APR CLP MED 11 20 MLT OPN (CLIP) ×2
BAG DECANTER FOR FLEXI CONT (MISCELLANEOUS) ×3 IMPLANT
BENZOIN TINCTURE PRP APPL 2/3 (GAUZE/BANDAGES/DRESSINGS) ×2 IMPLANT
BUR MATCHSTICK NEURO 3.0 LAGG (BURR) IMPLANT
CANISTER SUCT 3000ML PPV (MISCELLANEOUS) ×3 IMPLANT
CARTRIDGE OIL MAESTRO DRILL (MISCELLANEOUS) IMPLANT
CLIP APPLIE 11 MED OPEN (CLIP) ×2 IMPLANT
CLIP LIGATING EXTRA MED SLVR (CLIP) ×1 IMPLANT
CLIP LIGATING EXTRA SM BLUE (MISCELLANEOUS) ×2 IMPLANT
COVER WAND RF STERILE (DRAPES) ×2 IMPLANT
DERMABOND ADVANCED (GAUZE/BANDAGES/DRESSINGS) ×1
DERMABOND ADVANCED .7 DNX12 (GAUZE/BANDAGES/DRESSINGS) ×2 IMPLANT
DIFFUSER DRILL AIR PNEUMATIC (MISCELLANEOUS) ×1 IMPLANT
DRAPE C-ARM 42X72 X-RAY (DRAPES) ×8 IMPLANT
DRAPE LAPAROTOMY 100X72X124 (DRAPES) ×3 IMPLANT
DRSG OPSITE POSTOP 4X8 (GAUZE/BANDAGES/DRESSINGS) ×3 IMPLANT
ELECT BLADE 4.0 EZ CLEAN MEGAD (MISCELLANEOUS) ×3
ELECT BLADE 6.5 EXT (BLADE) ×3 IMPLANT
ELECT REM PT RETURN 9FT ADLT (ELECTROSURGICAL) ×3
ELECTRODE BLDE 4.0 EZ CLN MEGD (MISCELLANEOUS) ×2 IMPLANT
ELECTRODE REM PT RTRN 9FT ADLT (ELECTROSURGICAL) ×2 IMPLANT
GAUZE 4X4 16PLY RFD (DISPOSABLE) IMPLANT
GAUZE SPONGE 4X4 12PLY STRL (GAUZE/BANDAGES/DRESSINGS) IMPLANT
GLOVE BIO SURGEON STRL SZ 6.5 (GLOVE) ×2 IMPLANT
GLOVE BIO SURGEON STRL SZ7.5 (GLOVE) ×2 IMPLANT
GLOVE BIOGEL PI IND STRL 6.5 (GLOVE) ×1 IMPLANT
GLOVE BIOGEL PI IND STRL 7.0 (GLOVE) IMPLANT
GLOVE BIOGEL PI IND STRL 7.5 (GLOVE) ×2 IMPLANT
GLOVE BIOGEL PI INDICATOR 6.5 (GLOVE) ×1
GLOVE BIOGEL PI INDICATOR 7.0 (GLOVE)
GLOVE BIOGEL PI INDICATOR 7.5 (GLOVE) ×2
GLOVE ECLIPSE 9.0 STRL (GLOVE) ×3 IMPLANT
GLOVE EXAM NITRILE XL STR (GLOVE) IMPLANT
GLOVE SS BIOGEL STRL SZ 7.5 (GLOVE) ×2 IMPLANT
GLOVE SUPERSENSE BIOGEL SZ 7.5 (GLOVE) ×1
GLOVE SURG SS PI 7.0 STRL IVOR (GLOVE) ×8 IMPLANT
GOWN STRL REUS W/ TWL LRG LVL3 (GOWN DISPOSABLE) ×2 IMPLANT
GOWN STRL REUS W/ TWL XL LVL3 (GOWN DISPOSABLE) ×5 IMPLANT
GOWN STRL REUS W/TWL 2XL LVL3 (GOWN DISPOSABLE) IMPLANT
GOWN STRL REUS W/TWL LRG LVL3 (GOWN DISPOSABLE) ×3
GOWN STRL REUS W/TWL XL LVL3 (GOWN DISPOSABLE) ×12
INSERT FOGARTY 61MM (MISCELLANEOUS) IMPLANT
INSERT FOGARTY SM (MISCELLANEOUS) IMPLANT
KIT BASIN OR (CUSTOM PROCEDURE TRAY) ×3 IMPLANT
KIT INFUSE X SMALL 1.4CC (Orthopedic Implant) ×2 IMPLANT
LOOP VESSEL MAXI BLUE (MISCELLANEOUS) IMPLANT
LOOP VESSEL MINI RED (MISCELLANEOUS) IMPLANT
NDL SPNL 18GX3.5 QUINCKE PK (NEEDLE) ×2 IMPLANT
NEEDLE HYPO 22GX1.5 SAFETY (NEEDLE) ×3 IMPLANT
NEEDLE SPNL 18GX3.5 QUINCKE PK (NEEDLE) ×3 IMPLANT
NS IRRIG 1000ML POUR BTL (IV SOLUTION) ×3 IMPLANT
OIL CARTRIDGE MAESTRO DRILL (MISCELLANEOUS)
PACK LAMINECTOMY NEURO (CUSTOM PROCEDURE TRAY) ×3 IMPLANT
PAD ARMBOARD 7.5X6 YLW CONV (MISCELLANEOUS) ×6 IMPLANT
PUTTY BONE DBX 5CC MIX (Putty) ×2 IMPLANT
SPACER HEDRON IA 29X39 15 15D (Spacer) ×1 IMPLANT
SPONGE INTESTINAL PEANUT (DISPOSABLE) ×6 IMPLANT
SPONGE LAP 18X18 RF (DISPOSABLE) ×3 IMPLANT
SPONGE LAP 4X18 RFD (DISPOSABLE) IMPLANT
SPONGE SURGIFOAM ABS GEL 100 (HEMOSTASIS) ×3 IMPLANT
STAPLER VISISTAT 35W (STAPLE) ×3 IMPLANT
STRIP CLOSURE SKIN 1/2X4 (GAUZE/BANDAGES/DRESSINGS) ×2 IMPLANT
SUT PROLENE 4 0 RB 1 (SUTURE)
SUT PROLENE 4-0 RB1 .5 CRCL 36 (SUTURE) IMPLANT
SUT PROLENE 5 0 CC1 (SUTURE) IMPLANT
SUT PROLENE 6 0 C 1 30 (SUTURE) ×1 IMPLANT
SUT PROLENE 6 0 CC (SUTURE) IMPLANT
SUT SILK 0 TIES 10X30 (SUTURE) ×1 IMPLANT
SUT SILK 2 0 TIES 10X30 (SUTURE) ×3 IMPLANT
SUT SILK 2 0SH CR/8 30 (SUTURE) IMPLANT
SUT SILK 3 0 SH CR/8 (SUTURE) IMPLANT
SUT SILK 3 0 TIES 10X30 (SUTURE) ×1 IMPLANT
SUT SILK 3 0SH CR/8 30 (SUTURE) IMPLANT
SUT VIC AB 0 CT1 18XCR BRD8 (SUTURE) ×2 IMPLANT
SUT VIC AB 0 CT1 27 (SUTURE) ×3
SUT VIC AB 0 CT1 27XBRD ANBCTR (SUTURE) ×3 IMPLANT
SUT VIC AB 0 CT1 27XBRD ANTBC (SUTURE) IMPLANT
SUT VIC AB 0 CT1 8-18 (SUTURE) ×3
SUT VIC AB 2-0 CT1 18 (SUTURE) ×5 IMPLANT
SUT VIC AB 3-0 SH 8-18 (SUTURE) ×3 IMPLANT
SUT VICRYL 4-0 PS2 18IN ABS (SUTURE) ×1 IMPLANT
TOWEL GREEN STERILE (TOWEL DISPOSABLE) ×3 IMPLANT
TOWEL GREEN STERILE FF (TOWEL DISPOSABLE) ×3 IMPLANT
TRAY FOLEY MTR SLVR 16FR STAT (SET/KITS/TRAYS/PACK) ×3 IMPLANT
WATER STERILE IRR 1000ML POUR (IV SOLUTION) ×3 IMPLANT

## 2020-03-25 NOTE — Brief Op Note (Signed)
03/25/2020  10:05 AM  PATIENT:  Cory Espinoza  49 y.o. male  PRE-OPERATIVE DIAGNOSIS:  Spondylolisthesis  POST-OPERATIVE DIAGNOSIS:  Spondylolisthesis  PROCEDURE:  Procedure(s) with comments: ANTERIOR LUMBAR INTERBODY FUSION LUMBAR FIVE-SACRAL ONE. (N/A) - anterior ABDOMINAL EXPOSURE (N/A)  SURGEON:  Surgeon(s) and Role: Panel 1:    * Julio Sicks, MD - Primary Panel 2:    * Early, Kristen Loader, MD - Primary  PHYSICIAN ASSISTANT:   ASSISTANTSMarland Mcalpine   ANESTHESIA:   general  EBL:  200 mL   BLOOD ADMINISTERED:none  DRAINS: none   LOCAL MEDICATIONS USED:  MARCAINE     SPECIMEN:  No Specimen  DISPOSITION OF SPECIMEN:  N/A  COUNTS:  YES  TOURNIQUET:  * No tourniquets in log *  DICTATION: .Dragon Dictation  PLAN OF CARE: Admit to inpatient   PATIENT DISPOSITION:  PACU - hemodynamically stable.   Delay start of Pharmacological VTE agent (>24hrs) due to surgical blood loss or risk of bleeding: yes

## 2020-03-25 NOTE — Anesthesia Postprocedure Evaluation (Signed)
Anesthesia Post Note  Patient: BENITO LEMMERMAN  Procedure(s) Performed: ANTERIOR LUMBAR INTERBODY FUSION LUMBAR FIVE-SACRAL ONE. (N/A Spine Lumbar) ABDOMINAL EXPOSURE (N/A )     Patient location during evaluation: PACU Anesthesia Type: General Level of consciousness: awake and alert Pain management: pain level controlled Vital Signs Assessment: post-procedure vital signs reviewed and stable Respiratory status: spontaneous breathing, nonlabored ventilation and respiratory function stable Cardiovascular status: blood pressure returned to baseline and stable Postop Assessment: no apparent nausea or vomiting Anesthetic complications: no    Last Vitals:  Vitals:   03/25/20 1118 03/25/20 1149  BP: 97/68 112/73  Pulse: 82 68  Resp: 15 18  Temp: 36.5 C 36.7 C  SpO2: 92% 97%    Last Pain:  Vitals:   03/25/20 1149  TempSrc: Oral  PainSc:                  Robt Okuda,W. EDMOND

## 2020-03-25 NOTE — Evaluation (Signed)
Physical Therapy Evaluation Patient Details Name: Cory Espinoza MRN: [id number] DOB: Sep 09, 1971 Today's Date: 03/25/2020   History of Present Illness  Pt is a 49 y/o male s/p L5-S1 ALIF on 03/25/20. PMH: ACDF 01/2020, anxiety, arthritis, afib, COPD, HTN, MI, PNA, sleep apnea.  Clinical Impression   Pt presents with severe low back and abdominal pain, difficulty mobilizing OOB, decreased knowledge of spinal precautions, unsteadiness in standing with history of falls, and decreased activity tolerance. Pt to benefit from acute PT to address deficits. Pt ambulated hallway distance with use of RW and frequent verbal cuing for form and safety, pt with UE and LE trembling throughout which pt states is baseline. Pt educated on ankle pumps (20/hour) to perform this afternoon/evening to increase circulation, to pt's tolerance and limited by pain. PT to progress mobility as tolerated, and will continue to follow acutely.        Follow Up Recommendations Home health PT;Supervision/Assistance - 24 hour    Equipment Recommendations  None recommended by PT    Recommendations for Other Services       Precautions / Restrictions Precautions Precautions: Fall;Back Precaution Booklet Issued: Yes (comment) Precaution Comments: handout administered, PT verbally reviewed no bending, lifting, twisting, arching, and log roll technique in and out of bed Required Braces or Orthoses: Spinal Brace Spinal Brace: Applied in sitting position;Lumbar corset Restrictions Weight Bearing Restrictions: No      Mobility  Bed Mobility Overal bed mobility: Needs Assistance Bed Mobility: Rolling;Sidelying to Sit;Sit to Sidelying Rolling: Min guard Sidelying to sit: Min assist     Sit to sidelying: Min guard General bed mobility comments: min guard for rolling and return to supine for safety with verbal cuing for log roll technique, min assist for sidelying to sit for trunk elevation.  Transfers Overall transfer  level: Needs assistance Equipment used: Rolling walker (2 wheeled) Transfers: Sit to/from Stand Sit to Stand: Min guard;From elevated surface         General transfer comment: supervision for safety, verbal cuing for sequencing and hand placement when rising and sitting.  Ambulation/Gait Ambulation/Gait assistance: Min guard Gait Distance (Feet): 125 Feet Assistive device: Rolling walker (2 wheeled) Gait Pattern/deviations: Step-through pattern;Decreased stride length;Narrow base of support Gait velocity: decr   General Gait Details: Min guard for safety, verbal cuing for upright posture, relaxing shoulders down, placement in RW, and widening BOS. PT also cued for turning as unit when changing directions.  Stairs            Wheelchair Mobility    Modified Rankin (Stroke Patients Only)       Balance Overall balance assessment: Needs assistance;History of Falls Sitting-balance support: No upper extremity supported;Feet supported Sitting balance-Leahy Scale: Fair     Standing balance support: Bilateral upper extremity supported;During functional activity Standing balance-Leahy Scale: Poor Standing balance comment: reliant on external support                             Pertinent Vitals/Pain Pain Assessment: 0-10 Pain Score: 10-Worst pain ever Pain Location: back, abdomen Pain Descriptors / Indicators: Sore;Discomfort Pain Intervention(s): Limited activity within patient's tolerance;Monitored during session;Premedicated before session;Repositioned    Home Living Family/patient expects to be discharged to:: Private residence Living Arrangements: Spouse/significant other;Children Available Help at Discharge: Family;Available 24 hours/day Type of Home: House Home Access: Stairs to enter   [employer] of Steps: "a few" Home Layout: One level Home Equipment: Shower seat - built in;Walker -  2 wheels      Prior Function Level of Independence:  Needs assistance   Gait / Transfers Assistance Needed: pt reports use of RW PTA, states frequent falls  ADL's / Homemaking Assistance Needed: Pt reports occasional assist from spouse for ADLs        Hand Dominance   Dominant Hand: Right    Extremity/Trunk Assessment   Upper Extremity Assessment Upper Extremity Assessment: RUE deficits/detail RUE Deficits / Details: weakness secondary to fall, s/p ACDF. Not formally assessed    Lower Extremity Assessment Lower Extremity Assessment: Generalized weakness(limited by LE trembling, which pt reports is baseline since fall in October)    Cervical / Trunk Assessment Cervical / Trunk Assessment: Other exceptions Cervical / Trunk Exceptions: s/p ALIF  Communication   Communication: No difficulties  Cognition Arousal/Alertness: Awake/alert Behavior During Therapy: WFL for tasks assessed/performed Overall Cognitive Status: No family/caregiver present to determine baseline cognitive functioning                                 General Comments: pt follows commands well, requires increased time      General Comments      Exercises     Assessment/Plan    PT Assessment Patient needs continued PT services  PT Problem List Decreased strength;Decreased mobility;Decreased range of motion;Decreased knowledge of precautions;Decreased safety awareness;Decreased activity tolerance;Decreased balance;Decreased knowledge of use of DME;Pain       PT Treatment Interventions DME instruction;Gait training;Therapeutic exercise;Therapeutic activities;Patient/family education;Balance training;Stair training;Functional mobility training;Neuromuscular re-education    PT Goals (Current goals can be found in the Care Plan section)  Acute Rehab PT Goals Patient Stated Goal: go home PT Goal Formulation: With patient Time For Goal Achievement: 04/01/20 Potential to Achieve Goals: Good    Frequency Min 5X/week   Barriers to discharge         Co-evaluation               AM-PAC PT "6 Clicks" Mobility  Outcome Measure Help needed turning from your back to your side while in a flat bed without using bedrails?: A Little Help needed moving from lying on your back to sitting on the side of a flat bed without using bedrails?: A Little Help needed moving to and from a bed to a chair (including a wheelchair)?: A Little Help needed standing up from a chair using your arms (e.g., wheelchair or bedside chair)?: A Little Help needed to walk in hospital room?: A Little Help needed climbing 3-5 steps with a railing? : A Lot 6 Click Score: 17    End of Session Equipment Utilized During Treatment: Gait belt;Back brace Activity Tolerance: Patient tolerated treatment well;Patient limited by pain Patient left: in bed;with call bell/phone within reach;with SCD's reapplied Nurse Communication: Mobility status PT Visit Diagnosis: Other abnormalities of gait and mobility (R26.89);Muscle weakness (generalized) (M62.81);History of falling (Z91.81);Pain Pain - Right/Left: (low) Pain - part of body: (back, abdomen)    Time: 6144-3154 PT Time Calculation (min) (ACUTE ONLY): 22 min   Charges:   PT Evaluation $PT Eval Low Complexity: 1 Low         Avanish Cerullo E, PT Acute Rehabilitation Services Pager [phone]  Office [phone]  Abdullah Rizzi D Elonda Husky 03/25/2020, 5:45 PM

## 2020-03-25 NOTE — H&P (Signed)
Cory Espinoza is an 49 y.o. male.   Chief Complaint: Back pain HPI: Patient is 49 year old male with chronic mechanical back pain failing conservative management.  Work-up demonstrates evidence of marked disc space collapse with degenerative retrolisthesis and foraminal stenosis at L5-S1.  Patient has failed conservative management and presents now for L5-S1 anterior lumbar interbody fusion in hopes of improving his symptoms.  Past Medical History:  Diagnosis Date  . Allergy   . Anginal pain (HCC)   . Anxiety   . Arthritis    lumbar spine  . Asthma   . Atrial fibrillation (HCC)   . Back pain    Severe Lumbar Pain  . CHF (congestive heart failure) (HCC)   . COPD (chronic obstructive pulmonary disease) (HCC)    Restrictive lung disease  . Coronary artery disease   . Dyspnea   . Dysrhythmia    afib  . GERD (gastroesophageal reflux disease)   . Headache    [name], onset October 2020, cluster headaches in the past  . History of kidney stones   . Hyperlipidemia   . Hypertension   . Myocardial infarction (HCC)    at age 66  . Pneumonia   . Restrictive lung disease   . Sleep apnea    unable to use cpap since onset of migraines    Past Surgical History:  Procedure Laterality Date  . ANTERIOR CERVICAL DECOMP/DISCECTOMY FUSION N/A 01/23/2020   Procedure: ANTERIOR CERVICAL DECOMPRESSION FUSION - CERVICAL SIX-CERVICAL SEVEN;  Surgeon: Julio Sicks, MD;  Location: [facility] OR;  Service: Neurosurgery;  Laterality: N/A;  . BACK SURGERY  1996  . CARDIAC CATHETERIZATION Left 04/27/2016   Procedure: Left Heart Cath and Coronary Angiography;  Surgeon: Lamar Blinks, MD;  Location: [facility] INVASIVE CV LAB;  Service: Cardiovascular;  Laterality: Left;  . CARDIAC CATHETERIZATION N/A 04/27/2016   Procedure: Intravascular Pressure Wire/FFR Study;  Surgeon: Alwyn Pea, MD;  Location: [facility] INVASIVE CV LAB;  Service: Cardiovascular;  Laterality: N/A;  . CATHETERIZATION OF PULMONARY  ARTERY WITH RETRIEVAL OF FOREIGN BODY Bilateral 04/07/2011   heart  . DEGENERATIVE SPONDYLOLISTHESIS    . KNEE ARTHROSCOPY Bilateral 2004  . LUMBAR DISC SURGERY  1999  . RADICULOPATHY, CERVICAL REGION    . TONSILLECTOMY      Family History  Problem Relation Age of Onset  . Heart disease Mother   . Heart disease Father   . Heart attack Father 81  . Diabetes Sister   . Hypertension Sister   . Hyperlipidemia Sister   . Hypertension Brother   . Heart attack Brother 45  . Cancer Paternal Grandmother        breast  . Diabetes Paternal Grandmother   . Hyperlipidemia Brother   . Supraventricular tachycardia Brother   . Heart attack Paternal Grandfather 12   Social History:  reports that he quit smoking about 7 years ago. His smoking use included cigarettes. He started smoking about 30 years ago. He has a 46.00 pack-year smoking history. His smokeless tobacco use includes snuff. He reports current alcohol use. He reports that he does not use drugs.  Allergies:  Allergies  Allergen Reactions  . Aspirin Anaphylaxis  . Amoxicillin Hives and Itching    Has patient had a PCN reaction causing immediate rash, facial/tongue/throat swelling, SOB or lightheadedness with hypotension: No Has patient had a PCN reaction causing severe rash involving mucus membranes or skin necrosis: No Has patient had a PCN reaction that required hospitalization: No Has patient had a  PCN reaction occurring within the last 10 years: No If all of the above answers are "NO", then may proceed with Cephalosporin use.   . Codeine Hives  . Penicillins Hives and Itching    Has patient had a PCN reaction causing immediate rash, facial/tongue/throat swelling, SOB or lightheadedness with hypotension: No Has patient had a PCN reaction causing severe rash involving mucus membranes or skin necrosis: No Has patient had a PCN reaction that required hospitalization: No Has patient had a PCN reaction occurring within the last 10  years: No If all of the above answers are "NO", then may proceed with Cephalosporin use.   . Cefdinir Hives  . Hydrocodone-Acetaminophen Itching    Medications Prior to Admission  Medication Sig Dispense Refill  . AIMOVIG 70 MG/ML SOAJ Inject 70 mg into the skin every 28 (twenty-eight) days.    Marland Kitchen albuterol (PROVENTIL HFA;VENTOLIN HFA) 108 (90 Base) MCG/ACT inhaler Inhale 1-2 puffs into the lungs every 4 (four) hours as needed for wheezing or shortness of breath. 1 Inhaler 5  . ALPRAZolam (XANAX) 1 MG tablet Take 1-2 mg by mouth See admin instructions. Take 2 mg in the morning and 1 mg in the evening    . atorvastatin (LIPITOR) 40 MG tablet Take 1 tablet (40 mg total) by mouth at bedtime. (Patient taking differently: Take 40 mg by mouth daily. ) 90 tablet 1  . B COMPLEX VITAMINS PO Take 1 tablet by mouth daily.     . baclofen (LIORESAL) 10 MG tablet Take 10 mg by mouth 2 (two) times daily as needed for muscle spasms.    . cetirizine (ZYRTEC) 10 MG tablet TAKE 1 TABLET(10 MG) BY MOUTH DAILY (Patient taking differently: Take 10 mg by mouth daily. ) 90 tablet 1  . Cholecalciferol (VITAMIN D) 125 MCG (5000 UT) CAPS Take 5,000 Units by mouth daily.    . clopidogrel (PLAVIX) 75 MG tablet Take 1 tablet (75 mg total) by mouth daily. 90 tablet 1  . escitalopram (LEXAPRO) 20 MG tablet Take 20 mg by mouth at bedtime.     Marland Kitchen ketorolac (TORADOL) 10 MG tablet Take 10 mg by mouth every 6 (six) hours as needed for moderate pain.    . magnesium oxide (MAG-OX) 400 MG tablet Take 400 mg by mouth daily.     . Melatonin 10 MG TABS Take 30 mg by mouth at bedtime.    . NURTEC 75 MG TBDP Place 75 mg under the tongue daily as needed (migraine).     . pantoprazole (PROTONIX) 40 MG tablet TAKE 1 TABLET(40 MG) BY MOUTH DAILY (Patient taking differently: Take 40 mg by mouth daily. ) 30 tablet 10  . Potassium 99 MG TABS Take 99 mg by mouth daily.     . Probiotic Product (PROBIOTIC DAILY PO) Take 1 tablet by mouth daily.     Marland Kitchen pyridOXINE (VITAMIN B-6) 100 MG tablet Take 100 mg by mouth daily.     . Saw Palmetto 450 MG CAPS Take 900 mg by mouth daily.     . Tiotropium Bromide Monohydrate (SPIRIVA RESPIMAT) 2.5 MCG/ACT AERS Inhale 2 puffs into the lungs daily. 8 g 0  . zonisamide (ZONEGRAN) 100 MG capsule Take 400 mg by mouth at bedtime.     . benzonatate (TESSALON) 200 MG capsule Take 1 capsule (200 mg total) by mouth 3 (three) times daily as needed for cough. 30 capsule 2  . ipratropium-albuterol (DUONEB) 0.5-2.5 (3) MG/3ML SOLN Take 3 mLs by nebulization every 4 (  four) hours as needed. DX: J44.9 COPD (Patient taking differently: Take 3 mLs by nebulization every 4 (four) hours as needed (Wheezing). DX: J44.9 COPD) 360 mL 3  . nitroGLYCERIN (NITROSTAT) 0.4 MG SL tablet Place 0.4 mg under the tongue every 5 (five) minutes as needed for chest pain.    . promethazine (PHENERGAN) 25 MG tablet Take 25 mg by mouth daily.       No results found for this or any previous visit (from the past 48 hour(s)). No results found.  Pertinent items noted in HPI and remainder of comprehensive ROS otherwise negative.  Blood pressure 112/76, pulse 60, temperature 98.2 F (36.8 C), temperature source Oral, resp. rate 17, height 6' (1.829 m), weight 91.6 kg, SpO2 97 %.  Patient is awake and alert.  He is oriented and appropriate speech is fluent.  Judgment insight are intact.  Cranial nerve function normal bilateral on examination extremities normal.  Defect versus hypoactive.  Gait antalgic.  Posture flexed peer examination head ears eyes nose throat summer.  Chest and abdomen benign.  Extremities are free from injury or deformity. Assessment/Plan L5-S1 degenerative disc disease with degenerative spondylolisthesis and foraminal stenosis.  Plan L5-S1 anterior lumbar interbody fusion utilizing anterior interbody cage and instrumentation with morselized allograft and bone morphogenic protein.  Risks and benefits been explained.  Patient  wishes to proceed.  Sherilyn Cooter A Ayen Viviano 03/25/2020, 7:37 AM

## 2020-03-25 NOTE — Anesthesia Procedure Notes (Signed)
Procedure Name: Intubation Date/Time: 03/25/2020 8:01 AM Performed by: Gwenyth Allegra, CRNA Pre-anesthesia Checklist: Patient identified, Emergency Drugs available, Suction available, Patient being monitored and Timeout performed Patient Re-evaluated:Patient Re-evaluated prior to induction Oxygen Delivery Method: Circle system utilized Preoxygenation: Pre-oxygenation with 100% oxygen Induction Type: IV induction Ventilation: Mask ventilation without difficulty Laryngoscope Size: Glidescope and 4 Grade View: Grade I Tube size: 7.5 mm Number of attempts: 1 Airway Equipment and Method: Stylet and Video-laryngoscopy Placement Confirmation: ETT inserted through vocal cords under direct vision,  breath sounds checked- equal and bilateral and positive ETCO2 Secured at: 23 cm Tube secured with: Tape Dental Injury: Teeth and Oropharynx as per pre-operative assessment

## 2020-03-25 NOTE — Progress Notes (Signed)
Pharmacy Antibiotic Note  Cory Espinoza is a 49 y.o. male admitted on 03/25/2020 with surgical prophylaxis for L5-S1 anterior lumbar interbody decompression and fusion with interbody cage, morselized allograft, bone morphogenic protein, and anterior plate fixation .  Pharmacy has been consulted for vancomycin dosing postoperatively for one dose twelve hours post-op (unless patient has a drain, then continue vancomycin until discontinued by physician).  Vancomycin 1 g IV administered preoperatively on 03/25/20 at 0745 for this patient who weighs less than 100 kg.  No drains noted.  Plan: Vancomycin 1 g IV x 1 at 2000 today  Height: 6' (182.9 cm) Weight: 91.6 kg (202 lb) IBW/kg (Calculated) : 77.6  Temp (24hrs), Avg:97.9 F (36.6 C), Min:97.7 F (36.5 C), Max:98.2 F (36.8 C)  Recent Labs  Lab 03/21/20 1410  WBC 5.9  CREATININE 0.88    Estimated Creatinine Clearance: 113.9 mL/min (by C-G formula based on SCr of 0.88 mg/dL).    Allergies  Allergen Reactions  . Aspirin Anaphylaxis  . Amoxicillin Hives and Itching    Has patient had a PCN reaction causing immediate rash, facial/tongue/throat swelling, SOB or lightheadedness with hypotension: No Has patient had a PCN reaction causing severe rash involving mucus membranes or skin necrosis: No Has patient had a PCN reaction that required hospitalization: No Has patient had a PCN reaction occurring within the last 10 years: No If all of the above answers are "NO", then may proceed with Cephalosporin use.   . Codeine Hives  . Penicillins Hives and Itching    Has patient had a PCN reaction causing immediate rash, facial/tongue/throat swelling, SOB or lightheadedness with hypotension: No Has patient had a PCN reaction causing severe rash involving mucus membranes or skin necrosis: No Has patient had a PCN reaction that required hospitalization: No Has patient had a PCN reaction occurring within the last 10 years: No If all of the above  answers are "NO", then may proceed with Cephalosporin use.   . Cefdinir Hives  . Hydrocodone-Acetaminophen Itching      Thank you for allowing pharmacy to be a part of this patient's care.  Royce Macadamia, PharmD, BCPS 03/25/2020 11:59 AM

## 2020-03-25 NOTE — Op Note (Signed)
Date of procedure: 03/25/2020  Date of dictation: Same  Service: Neurosurgery  Preoperative diagnosis: L5-S1 degenerative spondylolisthesis with foraminal stenosis and intractable chronic back pain  Postoperative diagnosis: Same  Procedure Name: L5-S1 anterior lumbar interbody decompression and fusion with interbody cage, morselized allograft, bone morphogenic protein, and anterior plate fixation  Surgeon:Weslie Pretlow A.Marijean Montanye, M.D.  Asst. Surgeon: Doran Durand, NP  Vascular surgeon: Early  Anesthesia: General  Indication: 49 year old Fe male with chronic back pain with intermittent lower extremity symptoms failing conservative manage.  Work-up demonstrates evidence of marked disc generation with the space collapse and retrolisthesis with foraminal stenosis at L5-S1.  Patient is failed conservative management presents now for anterior lumbar interbody fusion in hopes of improving his symptoms.  Operative note: After induction of anesthesia, patient position supine.  Lower abdomen was prepped and draped sterilely.  Dr. Arbie Cookey then performed a retroperitoneal approach on the left side exposing the anterior disc space of L5-S1.  Self-retaining retractors were placed.  Fluoroscopy was used and the level was confirmed.  Midline was established.  I then performed a discectomy by incising this space and removing the disc material using various instruments down 12 the posterior annulus.  Using curettes and Kerrison rongeurs remaining aspects of annulus and the posterior logical ligament was resected.  Underlying thecal sac was identified.  Foraminotomies were completed along the course the exiting L5 nerve roots bilaterally.  At this point a very thorough decompression had been achieved.  The disc patient then prepared for interbody fusion by removing endplates with various curettes.  A globus medical 15 mm x 15 degree large titanium 3D printed implant was then packed with DBX putty and bone neurogenic protein.  This  is then impacted in the place.  Locking anchors were then impacted in the body of L5 and S1.  Locking anchors were secured using locking screws.  Final images reveal good position of the cage and the hardware at the proper operative level with normal alignment of the spine.  The wound was irrigated one final time.  Retractors were removed.  There is no evidence of any vascular injury.  Wounds and closed in a typical fashion.  Sterile dressing was applied.  No apparent complications.  Patient tolerated the procedure well and he returns to the recovery room postop.

## 2020-03-25 NOTE — Anesthesia Procedure Notes (Signed)
Arterial Line Insertion Start/End4/19/2021 7:10 AM, 03/25/2020 7:25 AM Performed by: Adair Laundry, CRNA, CRNA  Patient location: Pre-op. Preanesthetic checklist: patient identified, IV checked, risks and benefits discussed, surgical consent and monitors and equipment checked Lidocaine 1% used for infiltration Left, radial was placed Catheter size: 20 G Hand hygiene performed , maximum sterile barriers used  and Seldinger technique used  Attempts: 1 Procedure performed without using ultrasound guided technique. Following insertion, dressing applied and Biopatch. Post procedure assessment: normal  Patient tolerated the procedure well with no immediate complications.

## 2020-03-25 NOTE — Transfer of Care (Signed)
Immediate Anesthesia Transfer of Care Note  Patient: Cory Espinoza  Procedure(s) Performed: ANTERIOR LUMBAR INTERBODY FUSION LUMBAR FIVE-SACRAL ONE. (N/A Spine Lumbar) ABDOMINAL EXPOSURE (N/A )  Patient Location: PACU  Anesthesia Type:General  Level of Consciousness: awake and alert   Airway & Oxygen Therapy: Patient Spontanous Breathing and Patient connected to nasal cannula oxygen  Post-op Assessment: Report given to RN and Post -op Vital signs reviewed and stable  Post vital signs: Reviewed and stable  Last Vitals:  Vitals Value Taken Time  BP 124/87 03/25/20 1033  Temp 36.5 C 03/25/20 1033  Pulse 83 03/25/20 1042  Resp 17 03/25/20 1042  SpO2 100 % 03/25/20 1042  Vitals shown include unvalidated device data.  Last Pain:  Vitals:   03/25/20 0648  TempSrc:   PainSc: 9          Complications: No apparent anesthesia complications

## 2020-03-25 NOTE — Op Note (Signed)
    OPERATIVE REPORT  DATE OF SURGERY: 03/25/2020  PATIENT: Cory Espinoza, 49 y.o. male MRN: [id number]  DOB: 10-17-1971  PRE-OPERATIVE DIAGNOSIS: Degenerative disc disease  POST-OPERATIVE DIAGNOSIS:  Same  PROCEDURE: Anterior exposure for L5-S1 interbody fusion  SURGEON:  Gretta Began, M.D.  Co-surgeon for the exposure Dr. Julio Sicks  ANESTHESIA: General  EBL: per anesthesia record  Total I/O In: 2120 [P.O.:120; I.V.:1600; Other:200; IV Piggyback:200] Out: 325 [Urine:125; Blood:200]  BLOOD ADMINISTERED: none  DRAINS: none  SPECIMEN: none  COUNTS CORRECT:  YES  PATIENT DISPOSITION:  PACU - hemodynamically stable  PROCEDURE DETAILS: The patient was taken to the operating placed supine position where the area of the abdomen was prepped and draped in the usual sterile fashion.  Crosstable lateral projection with C arm was used to unify the level of the L5-S1 disc.  An incision was made from the midline to the left lateral position over this area and carried down through the subcutaneous fat with electrocautery.  Anterior rectus sheath was opened in line with the skin incision.  The rectus sheath was mobilized circumferentially.  The retroperitoneal space was entered bluntly in the left lower quadrant and the intra-abdominal contents and peritoneal sac were mobilized to the right.  The left ureter was mobilized to the right.  Dissection extended down to the L5-S1 disc.  The iliac vessels were mobilized to the right and left of the L5-S1 disc.  Middle sacral branches were clipped and divided.  Blunt dissection was used to give adequate exposure to the anterior surface of the L5-S1 disc.  The Thompson retractor was brought onto the field and the reverse lip 150 blade was positioned to the right of the L5-S1 disc.  140 malleable were positioned superiorly and to the left of the L5-S1 disc.  Malleable 190 blade was positioned in the pelvis.  A spinal needle was positioned in the L5-S1  disc and C-arm was brought back onto the field field to confirm this was the appropriate level.  The remainder of the dictation will be dictated as a separate note by Dr. Marletta Lor, M.D., [facility] 03/25/2020 12:17 PM

## 2020-03-26 ENCOUNTER — Encounter: Payer: Self-pay | Admitting: *Deleted

## 2020-03-26 MED ORDER — OXYCODONE HCL 10 MG PO TABS: 10.0000 mg | ORAL_TABLET | ORAL | 0 refills | Status: DC | PRN

## 2020-03-26 NOTE — Progress Notes (Signed)
Patient ID: Cory Espinoza, male   DOB: [birth year], 49 y.o.   MRN: [id number] Comfortable this morning.  Reports soreness in his right lower quadrant.  Had some nausea yesterday evening.  This is resolved and he is eating breakfast this morning. Abdomen soft and nontender. 2+ dorsalis pedis pulses bilaterally. Stable postop.  Will not follow actively.  Please call if we can assist.

## 2020-03-26 NOTE — Progress Notes (Signed)
Physical Therapy Treatment Patient Details Name: Cory Espinoza MRN: [id number] DOB: 11-Sep-1971 Today's Date: 03/26/2020    History of Present Illness Pt is a 49 y/o male s/p L5-S1 ALIF on 03/25/20. PMH: ACDF 01/2020, anxiety, arthritis, afib, COPD, HTN, MI, PNA, sleep apnea.    PT Comments    Patient progressing well towards PT goals. Reports some pain in his abdomen and back, "but nothing I cannot handle." Tolerated gait training and stair training with Min guard-Min A for balance/safety with difficulty negotiating steps. Instructed pt to have family help him to enter home. Able to recall 2/3 back precautions from yesterday's session. Education re: precautions, brace, log roll technique, positioning, exercise/walking program etc.  Will continue to follow and progress as tolerated.   Follow Up Recommendations  Home health PT;Supervision for mobility/OOB     Equipment Recommendations  None recommended by PT    Recommendations for Other Services       Precautions / Restrictions Precautions Precautions: Fall;Back Precaution Booklet Issued: Yes (comment) Precaution Comments: Recalling 2/3 precautions Required Braces or Orthoses: Spinal Brace Spinal Brace: Applied in sitting position;Lumbar corset Restrictions Weight Bearing Restrictions: No    Mobility  Bed Mobility Overal bed mobility: Needs Assistance Bed Mobility: Rolling;Sidelying to Sit;Sit to Sidelying Rolling: Supervision Sidelying to sit: Supervision     Sit to sidelying: Supervision General bed mobility comments: HOB flat to simulate home, good demo of log roll technique to get in/out of bed.  Transfers Overall transfer level: Needs assistance Equipment used: Rolling walker (2 wheeled) Transfers: Sit to/from Stand Sit to Stand: Min guard         General transfer comment: Cues for hand placement/technique. Slow to rise.  Ambulation/Gait Ambulation/Gait assistance: Min guard Gait Distance (Feet): 400  Feet Assistive device: Rolling walker (2 wheeled) Gait Pattern/deviations: Step-through pattern;Decreased stride length;Narrow base of support;Trunk flexed Gait velocity: decr   General Gait Details: Slow, mostly steady gait with tremoring noted (reports this happens when he is nervous or giving effort); a few standing rest breaks due to 2/4 DOE. Cues for upright posture, to relax shoulders and for RW proximity.   Stairs Stairs: Yes Stairs assistance: Min guard;Min assist Stair Management: One rail Right;Step to pattern;Forwards Number of Stairs: 3 General stair comments: Cues for technique and safety. Difficulty performing and slow.   Wheelchair Mobility    Modified Rankin (Stroke Patients Only)       Balance Overall balance assessment: Needs assistance;History of Falls Sitting-balance support: Feet supported;No upper extremity supported Sitting balance-Leahy Scale: Fair     Standing balance support: During functional activity Standing balance-Leahy Scale: Poor Standing balance comment: reliant on external support                            Cognition Arousal/Alertness: Awake/alert Behavior During Therapy: WFL for tasks assessed/performed Overall Cognitive Status: Within Functional Limits for tasks assessed                                 General Comments: Retired Engineer, structural- likes to push through. Needs cues to scale back and not over do.      Exercises      General Comments General comments (skin integrity, edema, etc.): Discussed exercise and walking program per pt request.      Pertinent Vitals/Pain Pain Assessment: 0-10 Pain Score: 5  Pain Location: back, abdomen Pain Descriptors / Indicators: Sore;Discomfort;Aching  Pain Intervention(s): Repositioned;Monitored during session    Home Living                      Prior Function            PT Goals (current goals can now be found in the care plan section) Progress  towards PT goals: Progressing toward goals    Frequency    Min 5X/week      PT Plan Current plan remains appropriate    Co-evaluation              AM-PAC PT "6 Clicks" Mobility   Outcome Measure  Help needed turning from your back to your side while in a flat bed without using bedrails?: None Help needed moving from lying on your back to sitting on the side of a flat bed without using bedrails?: A Little Help needed moving to and from a bed to a chair (including a wheelchair)?: A Little Help needed standing up from a chair using your arms (e.g., wheelchair or bedside chair)?: A Little Help needed to walk in hospital room?: A Little Help needed climbing 3-5 steps with a railing? : A Little 6 Click Score: 19    End of Session Equipment Utilized During Treatment: Gait belt;Back brace Activity Tolerance: Patient tolerated treatment well Patient left: in bed;with call bell/phone within reach Nurse Communication: Mobility status PT Visit Diagnosis: Other abnormalities of gait and mobility (R26.89);Muscle weakness (generalized) (M62.81);History of falling (Z91.81);Pain Pain - part of body: (abdomen/back)     Time: 6712-4580 PT Time Calculation (min) (ACUTE ONLY): 32 min  Charges:  $Gait Training: 23-37 mins                     Vale Haven, PT, DPT Acute Rehabilitation Services Pager [phone] Office [phone]       Cory Espinoza 03/26/2020, 10:16 AM

## 2020-03-26 NOTE — Evaluation (Signed)
Occupational Therapy Evaluation Patient Details Name: Cory Espinoza MRN: [id number] DOB: [birth year] Today's Date: 03/26/2020    History of Present Illness Pt is a 49 y/o male s/p L5-S1 ALIF on 03/25/20. PMH: ACDF 01/2020, anxiety, arthritis, afib, COPD, HTN, MI, PNA, sleep apnea.   Clinical Impression   Pt admitted with above. He demonstrates the below listed deficits and will benefit from continued OT to maximize safety and independence with BADLs.  Pt is able to perform ADLs with min guard assist.  He requires min guard assist due impaired balance.  He is able to recall 3/3 back precautions with increased time, and requires min verbal cues to adhere to them.   He reports good family support.  Recommend HHOT. He has all needed DME.       Follow Up Recommendations  Home health OT;Supervision/Assistance - 24 hour    Equipment Recommendations  None recommended by OT    Recommendations for Other Services       Precautions / Restrictions Precautions Precautions: Fall;Back Precaution Booklet Issued: Yes (comment) Precaution Comments: able to recall 3/3 precautions with increased time.  Pt reports he has memory deficits  Required Braces or Orthoses: Spinal Brace Spinal Brace: Applied in sitting position;Lumbar corset Restrictions Weight Bearing Restrictions: No      Mobility Bed Mobility Overal bed mobility: Needs Assistance Bed Mobility: Rolling;Sidelying to Sit;Sit to Sidelying Rolling: Supervision Sidelying to sit: Supervision     Sit to sidelying: Supervision General bed mobility comments: HOB flat to simulate home environment   Transfers Overall transfer level: Needs assistance Equipment used: Rolling walker (2 wheeled) Transfers: Sit to/from [employer] Sit to Stand: Min guard Stand pivot transfers: Min guard       General transfer comment: min guard for balance, and safety.  Verbal cues for walker safety     Balance Overall balance assessment:  Needs assistance;History of Falls Sitting-balance support: Feet supported;No upper extremity supported Sitting balance-Leahy Scale: Good     Standing balance support: During functional activity Standing balance-Leahy Scale: Fair Standing balance comment: able to maintain static sitting with min guard assist                            ADL either performed or assessed with clinical judgement   ADL Overall ADL's : Needs assistance/impaired Eating/Feeding: Modified independent   Grooming: Wash/dry hands;Wash/dry face;Oral care;Brushing hair;Min guard;Standing Grooming Details (indicate cue type and reason): pt with LOB posteriorly, but steadied self with elbow against wall  Upper Body Bathing: Supervision/ safety;Set up;Sitting   Lower Body Bathing: Min guard;Sit to/from stand   Upper Body Dressing : Set up;Supervision/safety;Sitting   Lower Body Dressing: Min guard;Sit to/from stand Lower Body Dressing Details (indicate cue type and reason): able to perform figure 4  Toilet Transfer: Min guard;Comfort height toilet;Ambulation;Grab bars;RW;BSC   Toileting- Architect and Hygiene: Min guard;Sit to/from stand   Tub/ Shower Transfer: Walk-in shower;Min guard;Ambulation;Shower seat;Rolling walker   Functional mobility during ADLs: Min guard;Rolling walker General ADL Comments: recommended that pt sit to shower.  reviewed back precautions and safety with ADLs.  Pt able to don/doff brace with supervision      Vision   Additional Comments: pt wears sunglasses due to photophobia      Perception     Praxis      Pertinent Vitals/Pain Pain Assessment: Faces Pain Score: 5  Faces Pain Scale: Hurts little more Pain Location: back, abdomen Pain Descriptors / Indicators:  Sore;Discomfort;Aching Pain Intervention(s): Monitored during session;Repositioned     Hand Dominance Right   Extremity/Trunk Assessment Upper Extremity Assessment Upper Extremity Assessment:  RUE deficits/detail;LUE deficits/detail RUE Deficits / Details: pt with premorbid weakness.  Bil. UE tremulous  RUE Coordination: decreased fine motor;decreased gross motor LUE Deficits / Details: tremulous  LUE Coordination: decreased fine motor;decreased gross motor       Cervical / Trunk Assessment Cervical / Trunk Assessment: Other exceptions Cervical / Trunk Exceptions: s/p ALIF; with tremulous vs. ataxia    Communication Communication Communication: No difficulties   Cognition Arousal/Alertness: Awake/alert Behavior During Therapy: WFL for tasks assessed/performed Overall Cognitive Status: History of cognitive impairments - at baseline                                 General Comments: Pt reports he has memory impairment.  He is no longer able to work, he is unable to drive    General Comments  Discussed exercise and walking program per pt request.    Exercises     Shoulder Instructions      Home Living Family/patient expects to be discharged to:: Private residence Living Arrangements: Spouse/significant other;Children Available Help at Discharge: Family;Available 24 hours/day Type of Home: House Home Access: Stairs to enter [employer] of Steps: "a few"   Home Layout: One level     Bathroom Shower/Tub: Occupational psychologist: Standard     Home Equipment: Shower seat - built in;Walker - 2 wheels;Bedside commode   Additional Comments: lives with wife, mother in law, and adult daughter       Prior Functioning/Environment Level of Independence: Needs assistance  Gait / Transfers Assistance Needed: pt reports use of RW PTA, states frequent falls ADL's / Homemaking Assistance Needed: Pt reports occasional assist from spouse for ADLs Communication / Swallowing Assistance Needed: Pt was working as a Engineer, 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 he sustained a 10' fall, while training, and sustained back and cervical injury and  cognitive impairment           OT Problem List: Decreased strength;Decreased activity tolerance;Impaired balance (sitting and/or standing);Decreased coordination;Decreased cognition;Decreased safety awareness;Decreased knowledge of use of DME or AE;Decreased knowledge of precautions;Impaired UE functional use;Pain      OT Treatment/Interventions: Self-care/ADL training;DME and/or AE instruction;Therapeutic activities;Cognitive remediation/compensation;Patient/family education;Balance training    OT Goals(Current goals can be found in the care plan section) Acute Rehab OT Goals Patient Stated Goal: would like to return to work at some capacity  OT Goal Formulation: With patient Time For Goal Achievement: 04/02/20 Potential to Achieve Goals: Good  OT Frequency: Min 2X/week   Barriers to D/C:            Co-evaluation              AM-PAC OT "6 Clicks" Daily Activity     Outcome Measure Help from another person eating meals?: None Help from another person taking care of personal grooming?: A Little Help from another person toileting, which includes using toliet, bedpan, or urinal?: A Little Help from another person bathing (including washing, rinsing, drying)?: A Little Help from another person to put on and taking off regular upper body clothing?: A Little Help from another person to put on and taking off regular lower body clothing?: A Little 6 Click Score: 19   End of Session Equipment Utilized During Treatment: Back brace;Rolling walker Nurse Communication: Mobility status  Activity Tolerance: Patient tolerated treatment well Patient left: in bed;with call bell/phone within reach  OT Visit Diagnosis: Unsteadiness on feet (R26.81);Pain Pain - Right/Left: (back )                Time: 6759-1638 OT Time Calculation (min): 30 min Charges:  OT General Charges $OT Visit: 1 Visit OT Evaluation $OT Eval Moderate Complexity: 1 Mod OT Treatments $Self Care/Home Management  : 8-22 mins  Eber Jones., OTR/L Acute Rehabilitation Services Pager [phone] Office [phone]   Jeani Hawking M 03/26/2020, 12:52 PM

## 2020-03-26 NOTE — Progress Notes (Signed)
Occupational Therapy Progress Note  Met with wife and pt to instruct pt on pt's back precautions, safety with ADLs, recommendation to sit when showering, need for 24 hour supervision, and recommendation for follow up [place].  No DME recommended as they have everything they need.     03/26/20 1254  OT Visit Information  Last OT Received On 03/26/20  Assistance Needed +1  History of Present Illness Pt is a 49 y/o male s/p L5-S1 ALIF on 03/25/20. PMH: ACDF 01/2020, anxiety, arthritis, afib, COPD, HTN, MI, PNA, sleep apnea.  Precautions  Precautions Fall;Back  Precaution Booklet Issued Yes (comment)  Precaution Comments able to recall 3/3 precautions with increased time.  Pt reports he has memory deficits   Required Braces or Orthoses Spinal Brace  Spinal Brace Applied in sitting position;Lumbar corset  Pain Assessment  Pain Assessment Faces  Faces Pain Scale 4  Pain Location back, abdomen  Pain Descriptors / Indicators Sore;Discomfort;Aching  Pain Intervention(s) Monitored during session  Cognition  Arousal/Alertness Awake/alert  Behavior During Therapy WFL for tasks assessed/performed  Overall Cognitive Status History of cognitive impairments - at baseline  General Comments Pt reports he has memory impairment.  He is no longer able to work, he is unable to drive   Upper Extremity Assessment  Upper Extremity Assessment RUE deficits/detail;LUE deficits/detail  RUE Deficits / Details pt with premorbid weakness.  Bil. UE tremulous   RUE Coordination decreased fine motor;decreased gross motor  LUE Deficits / Details tremulous   LUE Coordination decreased fine motor;decreased gross motor  General Comments  General comments (skin integrity, edema, etc.) wife present.  Discussed with her and pt, pt's back precautions, how to safely perform LB ADLs, need for use of RW when up, how to safely perform grooming in standing, and need to sit to shower.  Discussed recommendation for follow up OT.  and,  recommended to her to discuss OP OT, PT, and SLP with his neruologist for his current cognitive and physical deficits   OT - End of Session  Equipment Utilized During Treatment Back brace;Rolling walker  Activity Tolerance Patient tolerated treatment well  Patient left in bed;with call bell/phone within reach  Nurse Communication Mobility status  OT Assessment/Plan  OT Visit Diagnosis Unsteadiness on feet (R26.81);Pain  Pain - Right/Left  (back )  OT Frequency (ACUTE ONLY) Min 2X/week  Follow Up Recommendations Home health OT;Supervision/Assistance - 24 hour  OT Equipment None recommended by OT  AM-PAC OT "6 Clicks" Daily Activity Outcome Measure (Version 2)  Help from another person eating meals? 4  Help from another person taking care of personal grooming? 3  Help from another person toileting, which includes using toliet, bedpan, or urinal? 3  Help from another person bathing (including washing, rinsing, drying)? 3  Help from another person to put on and taking off regular upper body clothing? 3  Help from another person to put on and taking off regular lower body clothing? 3  6 Click Score 19  Acute Rehab OT Goals  Patient Stated Goal would like to return to work at some capacity   OT Goal Formulation With patient  Time For Goal Achievement 04/02/20  Potential to Achieve Goals Good  OT Time Calculation  OT Start Time (ACUTE ONLY) 1103  OT Stop Time (ACUTE ONLY) 1125  OT Time Calculation (min) 22 min  OT General Charges  $OT Visit 1 Visit  OT Treatments  $Self Care/Home Management  8-22 mins  Nilsa Nutting., OTR/L Acute Rehabilitation Services Pager [phone]  Office [phone]

## 2020-03-26 NOTE — Plan of Care (Signed)
Patient alert and oriented, mae's well, voiding adequate amount of urine, swallowing without difficulty, no c/o pain at time of discharge. Patient discharged home with family. Script and discharged instructions given to patient. Patient and family stated understanding of instructions given. Patient has an appointment with Dr. Pool  

## 2020-03-26 NOTE — Discharge Summary (Signed)
Physician Discharge Summary  Patient ID: Cory Espinoza MRN: [id number] DOB/AGE: 08/01/1971 49 y.o.  Admit date: 03/25/2020 Discharge date: 03/26/2020  Admission Diagnoses:  Discharge Diagnoses:  Active Problems:   Degenerative spondylolisthesis   Discharged Condition: good  Hospital Course: Patient mated to the hospital where he underwent uncomplicated U5-K2 anterior lumbar interbody fusion.  Postoperatively progressing well.  Preoperative back and lower extremity pain improved.  Standing ambulating and voiding without difficulty.  Ready for discharge home.  Consults:   Significant Diagnostic Studies:   Treatments:   Discharge Exam: Blood pressure 112/78, pulse 69, temperature 97.8 F (36.6 C), temperature source Oral, resp. rate 18, height 6' (1.829 m), weight 91.6 kg, SpO2 96 %. Awake and alert.  Oriented and appropriate.  Motor and sensory function intact.  Wound clean and dry.  Chest and abdomen benign.  Disposition: Discharge disposition: 01-Home or Self Care        Allergies as of 03/26/2020      Reactions   Aspirin Anaphylaxis   Amoxicillin Hives, Itching   Has patient had a PCN reaction causing immediate rash, facial/tongue/throat swelling, SOB or lightheadedness with hypotension: No Has patient had a PCN reaction causing severe rash involving mucus membranes or skin necrosis: No Has patient had a PCN reaction that required hospitalization: No Has patient had a PCN reaction occurring within the last 10 years: No If all of the above answers are "NO", then may proceed with Cephalosporin use.   Codeine Hives   Penicillins Hives, Itching   Has patient had a PCN reaction causing immediate rash, facial/tongue/throat swelling, SOB or lightheadedness with hypotension: No Has patient had a PCN reaction causing severe rash involving mucus membranes or skin necrosis: No Has patient had a PCN reaction that required hospitalization: No Has patient had a PCN reaction  occurring within the last 10 years: No If all of the above answers are "NO", then may proceed with Cephalosporin use.   Cefdinir Hives   Hydrocodone-acetaminophen Itching      Medication List    STOP taking these medications   ketorolac 10 MG tablet Commonly known as: TORADOL     TAKE these medications   Aimovig 70 MG/ML Soaj Generic drug: Erenumab-aooe Inject 70 mg into the skin every 28 (twenty-eight) days.   albuterol 108 (90 Base) MCG/ACT inhaler Commonly known as: VENTOLIN HFA Inhale 1-2 puffs into the lungs every 4 (four) hours as needed for wheezing or shortness of breath.   ALPRAZolam 1 MG tablet Commonly known as: XANAX Take 1-2 mg by mouth See admin instructions. Take 2 mg in the morning and 1 mg in the evening   atorvastatin 40 MG tablet Commonly known as: LIPITOR Take 1 tablet (40 mg total) by mouth at bedtime. What changed: when to take this   B COMPLEX VITAMINS PO Take 1 tablet by mouth daily.   baclofen 10 MG tablet Commonly known as: LIORESAL Take 10 mg by mouth 2 (two) times daily as needed for muscle spasms.   benzonatate 200 MG capsule Commonly known as: TESSALON Take 1 capsule (200 mg total) by mouth 3 (three) times daily as needed for cough.   cetirizine 10 MG tablet Commonly known as: ZYRTEC TAKE 1 TABLET(10 MG) BY MOUTH DAILY What changed: See the new instructions.   clopidogrel 75 MG tablet Commonly known as: PLAVIX Take 1 tablet (75 mg total) by mouth daily.   escitalopram 20 MG tablet Commonly known as: LEXAPRO Take 20 mg by mouth at bedtime.  ipratropium-albuterol 0.5-2.5 (3) MG/3ML Soln Commonly known as: DUONEB Take 3 mLs by nebulization every 4 (four) hours as needed. DX: J44.9 COPD What changed: reasons to take this   magnesium oxide 400 MG tablet Commonly known as: MAG-OX Take 400 mg by mouth daily.   Melatonin 10 MG Tabs Take 30 mg by mouth at bedtime.   nitroGLYCERIN 0.4 MG SL tablet Commonly known as:  NITROSTAT Place 0.4 mg under the tongue every 5 (five) minutes as needed for chest pain.   Nurtec 75 MG Tbdp Generic drug: Rimegepant Sulfate Place 75 mg under the tongue daily as needed (migraine).   Oxycodone HCl 10 MG Tabs Take 1 tablet (10 mg total) by mouth every 3 (three) hours as needed for severe pain ((score 7 to 10)).   pantoprazole 40 MG tablet Commonly known as: PROTONIX TAKE 1 TABLET(40 MG) BY MOUTH DAILY What changed: See the new instructions.   Potassium 99 MG Tabs Take 99 mg by mouth daily.   PROBIOTIC DAILY PO Take 1 tablet by mouth daily.   promethazine 25 MG tablet Commonly known as: PHENERGAN Take 25 mg by mouth daily.   pyridOXINE 100 MG tablet Commonly known as: VITAMIN B-6 Take 100 mg by mouth daily.   Saw Palmetto 450 MG Caps Take 900 mg by mouth daily.   Spiriva Respimat 2.5 MCG/ACT Aers Generic drug: Tiotropium Bromide Monohydrate Inhale 2 puffs into the lungs daily.   Vitamin D 125 MCG (5000 UT) Caps Take 5,000 Units by mouth daily.   zonisamide 100 MG capsule Commonly known as: ZONEGRAN Take 400 mg by mouth at bedtime.            Durable Medical Equipment  (From admission, onward)         Start     Ordered   03/25/20 1143  DME Walker rolling  Once    Question:  Patient needs a walker to treat with the following condition  Answer:  Degenerative spondylolisthesis   03/25/20 1142   03/25/20 1143  DME 3 n 1  Once     03/25/20 1142           Signed: Sherilyn Cooter A Bahar Shelden 03/26/2020, 9:09 AM

## 2020-03-26 NOTE — Progress Notes (Addendum)
Patient requesting [facility] for [facility] services. CM called the agency. The agency requires faxing info prior to accepting patient. Info has been faxed. Awaiting decision. [facility] has declined patient. CM contacted patients spouse and received other choices for services.

## 2020-03-26 NOTE — Discharge Instructions (Signed)

## 2020-03-27 NOTE — TOC Transition Note (Signed)
Transition of Care [facility]) - CM/SW Discharge Note   Patient Details  Name: Cory Espinoza MRN: [id number] Date of Birth: December 13, 1970  [facility] CM/SW Contact:  Beckie Busing, RN Phone Number: 03/27/2020, 10:13 AM   Clinical Narrative:    Home health has been set up through [facility]. Patient and wife have been updated and agency will contact patient to set up services.         Patient Goals and CMS Choice        Discharge Placement                       Discharge Plan and Services                                     Social Determinants of Health (SDOH) Interventions     Readmission Risk Interventions No flowsheet data found.

## 2020-03-28 MED FILL — Sodium Chloride IV Soln 0.9%: INTRAVENOUS | Qty: 1000 | Status: AC

## 2020-03-28 MED FILL — Heparin Sodium (Porcine) Inj 1000 Unit/ML: INTRAMUSCULAR | Qty: 30 | Status: AC

## 2020-05-17 ENCOUNTER — Other Ambulatory Visit: Payer: Self-pay

## 2020-05-17 ENCOUNTER — Ambulatory Visit: Payer: BC Managed Care – PPO | Attending: Neurology

## 2020-05-17 DIAGNOSIS — M6281 Muscle weakness (generalized): Secondary | ICD-10-CM | POA: Insufficient documentation

## 2020-05-17 DIAGNOSIS — R2681 Unsteadiness on feet: Secondary | ICD-10-CM | POA: Insufficient documentation

## 2020-05-17 DIAGNOSIS — R42 Dizziness and giddiness: Secondary | ICD-10-CM | POA: Insufficient documentation

## 2020-05-17 NOTE — Therapy (Signed)
[facility] MAIN [facility] SERVICES [address] [place], [place], [address] Phone: [phone]   Fax:  [phone]  Physical Therapy Visit (No Charge)  Patient Details  Name: Cory Espinoza MRN: [id number] Date of Birth: 1971-12-02 No data recorded  Encounter Date: 05/17/2020   PT End of Session - 05/17/20 1230    Visit Number 1    Number of Visits 1    PT Start Time 0940    PT Stop Time 1020    PT Time Calculation (min) 40 min           Past Medical History:  Diagnosis Date  . Allergy   . Anginal pain (HCC)   . Anxiety   . Arthritis    lumbar spine  . Asthma   . Atrial fibrillation (HCC)   . Back pain    Severe Lumbar Pain  . CHF (congestive heart failure) (HCC)   . COPD (chronic obstructive pulmonary disease) (HCC)    Restrictive lung disease  . Coronary artery disease   . Dyspnea   . Dysrhythmia    afib  . GERD (gastroesophageal reflux disease)   . Headache    [name], onset October 2020, cluster headaches in the past  . History of kidney stones   . Hyperlipidemia   . Hypertension   . Myocardial infarction (HCC)    at age 68  . Pneumonia   . Restrictive lung disease   . Sleep apnea    unable to use cpap since onset of migraines    Past Surgical History:  Procedure Laterality Date  . ABDOMINAL EXPOSURE N/A 03/25/2020   Procedure: ABDOMINAL EXPOSURE;  Surgeon: Larina Earthly, MD;  Location: [facility] OR;  Service: Vascular;  Laterality: N/A;  . ANTERIOR CERVICAL DECOMP/DISCECTOMY FUSION N/A 01/23/2020   Procedure: ANTERIOR CERVICAL DECOMPRESSION FUSION - CERVICAL SIX-CERVICAL SEVEN;  Surgeon: Julio Sicks, MD;  Location: [facility] OR;  Service: Neurosurgery;  Laterality: N/A;  . ANTERIOR LUMBAR FUSION N/A 03/25/2020   Procedure: ANTERIOR LUMBAR INTERBODY FUSION LUMBAR FIVE-SACRAL ONE.;  Surgeon: Julio Sicks, MD;  Location: [facility] OR;  Service: Neurosurgery;  Laterality: N/A;  anterior  . BACK SURGERY  1996  . CARDIAC  CATHETERIZATION Left 04/27/2016   Procedure: Left Heart Cath and Coronary Angiography;  Surgeon: Lamar Blinks, MD;  Location: [facility] INVASIVE CV LAB;  Service: Cardiovascular;  Laterality: Left;  . CARDIAC CATHETERIZATION N/A 04/27/2016   Procedure: Intravascular Pressure Wire/FFR Study;  Surgeon: Alwyn Pea, MD;  Location: [facility] INVASIVE CV LAB;  Service: Cardiovascular;  Laterality: N/A;  . CATHETERIZATION OF PULMONARY ARTERY WITH RETRIEVAL OF FOREIGN BODY Bilateral 04/07/2011   heart  . DEGENERATIVE SPONDYLOLISTHESIS    . KNEE ARTHROSCOPY Bilateral 2004  . LUMBAR DISC SURGERY  1999  . RADICULOPATHY, CERVICAL REGION    . TONSILLECTOMY         Pt arrived and after a significant portion of his history pt mentioned that he is still getting home health PT. His last scheduled appointment with [facility] PT is for this coming Monday and wife reports that the therapist needs to complete paperwork and finish his episode of care so appointment has to be kept. [facility] OP PT evaluation deferred until next week. History that was already obtained was left below to speed up history taking when pt returns for his evaluation   History: Pt has a complex medical history. 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 He states that he fell 10-12 feet and struck his head, neck, and back. He was able to finish the course which took him approximately 45 minutes more to complete. He does endorse a second fall while finishing the course but did not suffer a second head injury. He states that he started getting headaches that day which have persisted since that time. 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 He does report improvement in his headaches recently. Neurology was concerned about possible BPPV and have referred him for a vestibular evaluation. In addition since the injury pt developed RUE and RLE pain and  weakness. Pt states that NCV showed abnormal nerve conduction in RUE. He has since undergone a C7-7 ACDF on 01/23/20. He reports initial improvement in RUE strength but states that he has a gradual return of his RUE weakness. He was also having significant RLE weakness and pain and underwent and L5-S1 anterior lumbar interbody fusion on 03/25/20. He arrives to therapy ambulating with an upright rollator. Pt denies any mention of concussion after his injury. He has been having cognitive issues since his injury and has previously had a heavy metals screen as well as neurocognitive testing. He has had an MRI of his brain with and without contrast on 12/18/19. Results were mildly motion degraded examination. No evidence of acute intracranial abnormality. Minimal chronic small vessel ischemic disease. He has tremor in both his hands. He has a positive family history of Parkinson's disease in his grandfather. He is complaining of constant dizziness and unsteadiness which are worse with activity. States that he frequently loses his balance and his wife has to steady him.                       Visit Diagnosis: Dizziness and giddiness     Problem List Patient Active Problem List   Diagnosis Date Noted  . Degenerative spondylolisthesis 03/25/2020  . Physical deconditioning 03/04/2020  . Lobar pneumonia, unspecified organism ([place]) 03/04/2020  . Intractable hemiplegic migraine with status migrainosus 03/02/2020  . Current tobacco use 02/05/2020  . Abnormal findings on diagnostic imaging of lung 02/05/2020  . Shortness of breath 02/05/2020  . Herniated disc, cervical 01/23/2020  . Cervical spondylosis with myelopathy and radiculopathy 01/23/2020  . Pre-operative respiratory examination 01/12/2020  . Weakness on right side of face 12/22/2019  . Osteoarthritis of spine with radiculopathy, lumbar region 12/11/2019  . Intractable migraine with aura with status migrainosus 11/09/2019  . Former  smoker 08/27/2018  . Benign essential HTN 08/27/2018  . Cough productive of purulent sputum 11/08/2017  . GERD (gastroesophageal reflux disease) 05/06/2017  . Contusion of hand 04/22/2017  . BP (high blood pressure) 04/22/2017  . Oxygen desaturation 04/22/2017  . Prostate lump 04/22/2017  . Unstable angina ([place]) 04/27/2016  . Chest pain 04/24/2016  . Morbid obesity ([place]) 01/29/2016  . Elevated ALT measurement 10/30/2015  . Nonspecific elevation of levels of transaminase and lactic acid dehydrogenase (ldh) 10/30/2015  . Reduced libido 10/29/2015  . Hyperlipidemia 08/06/2015  . Anxiety 08/06/2015  . Atherosclerosis of coronary artery 08/06/2015  . Childhood asthma 08/06/2015  . Chronic obstructive pulmonary disease ([place]) 08/06/2015  . OSA (obstructive sleep apnea) 08/06/2015  . Anxiety disorder 08/06/2015  . Atherosclerosis of native coronary artery of native heart with stable angina pectoris ([place]) 08/06/2015  . Uncomplicated asthma 42/35/3614   Lyndel Safe Arpita Fentress PT, DPT, GCS  Cory Espinoza 05/17/2020,  12:30 PM  [facility] MAIN [facility] SERVICES [address] [place], [place], [address] Phone: [phone]   Fax:  [phone]  Name: Cory Espinoza MRN: [id number] Date of Birth: Jul 11, 1971

## 2020-05-21 ENCOUNTER — Encounter: Payer: Self-pay | Admitting: Physical Therapy

## 2020-05-21 ENCOUNTER — Ambulatory Visit: Payer: BC Managed Care – PPO | Admitting: Physical Therapy

## 2020-05-21 ENCOUNTER — Other Ambulatory Visit: Payer: Self-pay

## 2020-05-21 DIAGNOSIS — R2681 Unsteadiness on feet: Secondary | ICD-10-CM | POA: Diagnosis present

## 2020-05-21 DIAGNOSIS — R42 Dizziness and giddiness: Secondary | ICD-10-CM | POA: Diagnosis present

## 2020-05-21 DIAGNOSIS — M6281 Muscle weakness (generalized): Secondary | ICD-10-CM | POA: Diagnosis present

## 2020-05-21 NOTE — Therapy (Signed)
[facility] MAIN [facility] SERVICES [address] [place], [place], [address] Phone: [phone]   Fax:  [phone]  Physical Therapy Evaluation  Patient Details  Name: Cory Espinoza MRN: [id number] Date of Birth: 07/01/1971 Referring Provider (PT): Dr. Steele Sizer   Encounter Date: 05/21/2020   PT End of Session - 05/21/20 0810    Visit Number 1    Number of Visits 17    Date for PT Re-Evaluation 07/16/20    PT Start Time 0810    PT Stop Time 0920    PT Time Calculation (min) 70 min           Past Medical History:  Diagnosis Date  . Allergy   . Anginal pain (HCC)   . Anxiety   . Arthritis    lumbar spine  . Asthma   . Atrial fibrillation (HCC)   . Back pain    Severe Lumbar Pain  . CHF (congestive heart failure) (HCC)   . COPD (chronic obstructive pulmonary disease) (HCC)    Restrictive lung disease  . Coronary artery disease   . Dyspnea   . Dysrhythmia    afib  . GERD (gastroesophageal reflux disease)   . Headache    [name], onset October 2020, cluster headaches in the past  . History of kidney stones   . Hyperlipidemia   . Hypertension   . Myocardial infarction (HCC)    at age 49  . Pneumonia   . Restrictive lung disease   . Sleep apnea    unable to use cpap since onset of migraines    Past Surgical History:  Procedure Laterality Date  . ABDOMINAL EXPOSURE N/A 03/25/2020   Procedure: ABDOMINAL EXPOSURE;  Surgeon: Larina Earthly, MD;  [facility];  Service: Vascular;  Laterality: N/A;  . ANTERIOR CERVICAL DECOMP/DISCECTOMY FUSION N/A 01/23/2020   Procedure: ANTERIOR CERVICAL DECOMPRESSION FUSION - CERVICAL SIX-CERVICAL SEVEN;  Surgeon: Julio Sicks, MD;  Location: [facility] OR;  Service: Neurosurgery;  Laterality: N/A;  . ANTERIOR LUMBAR FUSION N/A 03/25/2020   Procedure: ANTERIOR LUMBAR INTERBODY FUSION LUMBAR FIVE-SACRAL ONE.;  Surgeon: Julio Sicks, MD;  Location: [facility] OR;  Service: Neurosurgery;  Laterality: N/A;   anterior  . BACK SURGERY  1996  . CARDIAC CATHETERIZATION Left 04/27/2016   Procedure: Left Heart Cath and Coronary Angiography;  Surgeon: Lamar Blinks, MD;  Location: [facility] INVASIVE CV LAB;  Service: Cardiovascular;  Laterality: Left;  . CARDIAC CATHETERIZATION N/A 04/27/2016   Procedure: Intravascular Pressure Wire/FFR Study;  Surgeon: Alwyn Pea, MD;  Location: [facility] INVASIVE CV LAB;  Service: Cardiovascular;  Laterality: N/A;  . CATHETERIZATION OF PULMONARY ARTERY WITH RETRIEVAL OF FOREIGN BODY Bilateral 04/07/2011   heart  . DEGENERATIVE SPONDYLOLISTHESIS    . KNEE ARTHROSCOPY Bilateral 2004  . LUMBAR DISC SURGERY  1999  . RADICULOPATHY, CERVICAL REGION    . TONSILLECTOMY      There were no vitals filed for this visit.    Subjective Assessment - 05/21/20 0809    Subjective Patient reports that he has a migraine today and that is why he is wearing his sunglasses. Patient reports that his back and legs are hurting. Patient reports that he gets dizziness whenever he closes his eyes even when his head is still.    Pertinent History Pt has a complex medical history. 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 He states that he fell 10-12 feet  and struck his head, neck, and back. He was able to finish the course which took him approximately 45 minutes more to complete. He does endorse a second fall while finishing the course but did not suffer a second head injury. He states that he started getting headaches that day which have persisted since that time. 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 He does report improvement in his headaches recently. Neurology was concerned about possible BPPV and have referred him for a vestibular evaluation. In addition since the injury, pt developed RUE and RLE pain and weakness. Pt states that NCV showed abnormal nerve conduction in RUE. He has  since undergone a C7-7 ACDF on 01/23/20. He reports initial improvement in RUE strength, but states that he has a gradual return of his RUE weakness. He was also having significant RLE weakness and pain and underwent and L5-S1 anterior lumbar interbody fusion on 03/25/20. Patient reports that he does not have any back precautions but needs to wear the low back brace for one more month. Patient reports he has a follow-up appointment with the surgeon next month. Patient reports he has a follow-up appointment with Dr. Clelia Croft, neurologist, in August. He arrives to therapy ambulating with an upright rollator walker. Pt denies any mention of concussion after his injury. He has been having cognitive issues since his injury and has previously had a heavy metals screen as well as neurocognitive testing. He has had an MRI of his brain with and without contrast on 12/18/19. Results were mildly motion degraded examination. No evidence of acute intracranial abnormality. Minimal chronic small vessel ischemic disease. He has tremor in both his hands. He has a positive family history of Parkinson's disease in his grandfather. He is complaining of constant dizziness and unsteadiness which are worse with activity. Patient states that he frequently loses his balance and his wife has to steady him.    Limitations Lifting;Standing;Walking;House hold activities    Diagnostic tests He has had an MRI of his brain with and without contrast on 12/18/19. Results were mildly motion degraded examination. No evidence of acute intracranial abnormality. Minimal chronic small vessel ischemic disease.    Patient Stated Goals to be able to ambulate with a cane or no AD, to be able to drive, to be able to ride his motorcycle    Currently in Pain? Yes    Pain Score 5     Pain Location Back    Pain Orientation Lower    Pain Type Surgical pain;Chronic pain    Pain Frequency Several days a week    Multiple Pain Sites Yes    Pain Score 10    Pain  Location Leg    Pain Orientation Right;Left    Pain Descriptors / Indicators Burning    Pain Type Chronic pain              [facility] PT Assessment - 05/21/20 0852      Assessment   Medical Diagnosis BPPV    Referring Provider (PT) Dr. Steele Sizer    Onset Date/Surgical Date --   August 2020   Prior Therapy no prior vestibular therapy; has been receiving HHPT last day 05/20/2020      Precautions   Precautions Fall    Required Braces or Orthoses Spinal Brace      Restrictions   Weight Bearing Restrictions No      Balance Screen   Has the patient  fallen in the past 6 months Yes    How many times? 2    Has the patient had a decrease in activity level because of a fear of falling?  Yes      Home Environment   Living Environment Private residence    Living Arrangements Spouse/significant other    Available Help at Discharge Family;Friend(s)    Type of Home House    Home Access Stairs to enter;Ramped entrance    Entrance Stairs-Number of Steps 4    Entrance Stairs-Rails Left;Right;Can reach both    Home Layout One level   2 steps from kitchen to family room with grab bar on wall   Home Equipment Walker - 2 wheels;Grab bars - tub/shower;Shower seat;Bedside commode      Prior Function   Level of Independence Requires assistive device for independence;Needs assistance with gait;Needs assistance with transfers;Needs assistance with ADLs    Vocation --   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     Cognition   Overall Cognitive Status Impaired/Different from baseline    Area of Impairment Memory   per wife's report; TBA     Observation/Other Assessments   Focus on Therapeutic Outcomes (FOTO)  41/100      Sensation   Light Touch Impaired by gross assessment    Light Touch Impaired Details Impaired RUE      Coordination   Fine Motor Movements are Fluid and Coordinated No      Posture/Postural Control   Posture/Postural Control Postural limitations    Postural  Limitations --   forward trunk flexion in standing     Transfers   Transfers Sit to Stand    Sit to Stand 4: Min guard    Comments patient's right leg becomes tremulous with attempts to perform 5 reps of sit to stand.       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    VESTIBULAR AND BALANCE EVALUATION   HISTORY:  Subjective history of current problem: Pt has a complex medical history. 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 He states that he fell 10-12 feet and struck his head, neck, and back. He was able to finish the course which took him approximately 45 minutes more to complete. He does endorse a second fall while finishing the course but did not suffer a second head injury. He states that he started getting headaches that day which have persisted since that time. 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 He does report improvement in his headaches recently. Neurology was concerned about possible BPPV and have referred him for a vestibular evaluation. In addition since the injury, pt developed RUE and RLE pain and weakness. Pt states that NCV showed abnormal nerve conduction in RUE. He has since undergone  a C7-7 ACDF on 01/23/20. He reports initial improvement in RUE strength, but states that he has a gradual return of  his RUE weakness. He was also having significant RLE weakness and pain and underwent and L5-S1 anterior lumbar interbody fusion on 03/25/20. Patient reports that he does not have any back precautions but needs to wear the low back brace for one more month. Patient reports he has a follow-up appointment with the surgeon next month. Patient reports he has a follow-up appointment with Dr. Brigitte Pulse, neurologist, in August. He arrives to therapy ambulating with an upright rollator walker. Pt denies any mention of concussion after his injury. He has been having cognitive issues since his injury and has previously had a heavy metals screen as well as neurocognitive testing. He has had an MRI of his brain with and without contrast on 12/18/19. Results were mildly motion degraded examination. No evidence of acute intracranial abnormality. Minimal chronic small vessel ischemic disease. He has tremor in both his hands. He has a positive family history of Parkinson's disease in his grandfather. He is complaining of constant dizziness and unsteadiness which are worse with activity. Patient states that he frequently loses his balance and his wife has to steady him. Patient reports that he is having dizziness when he closes his eyes he will get dizzy even with his head still. Patient reports he tends to fall to his right. Patient reports  that he was working with the [place] and he lost his balance and the therapist had to catch him yesterday. Patient reports he gets no dizziness when lying down. Patient reports he has woken up with vertigo at times. Patient's wife reports that he had Bell's palsy affecting his right side of the face. Patient reports that he has sleep apnea and has not been able to use his CPAP machine since November secondary to the CPAP causing his migraines to worsen. Patient reports that he has had a migraine since August 2020 after the fall.  Patient reports that he has been performing seated UEs purple Thera-band  activities, standing mini squats, standing hip abd and flexion, marching in place and walking for HEP.  Description of dizziness: vertigo, unsteadiness, falling Progression of symptoms: better  History of similar episodes: no  Falls (yes/no): yes Number of falls in past 6 months: 2 and multiple near misses  Auditory complaints (tinnitus, pain, drainage): reports his ears drain and get crusty in both ears that started in the past 3-4 months Vision (last eye exam, diplopia, recent changes): patient wears glasses; patient reports occasional blurry vision and double vision when watching TV.  Current Symptoms: (dysarthria, dysphagia, drop attacks, bowel and bladder changes, recent weight loss/gain)    Patient's wife reports slurred speech and loss of weight since the head injury.  EXAMINATION   NEUROLOGICAL SCREEN: (2+ unless otherwise noted.) N=normal  Ab=abnormal  Level Dermatome R L  C3 Anterior Neck N N  C4 Top of Shoulder N N  C5 Lateral Upper Arm N N  C6 Lateral Arm/ Thumb AB N  C7 Middle Finger AB N  C8 4th & 5th Finger AB N  T1 Medial Arm AB N  L2 Medial thigh/groin N N  L3 Lower thigh/med.knee N N  L4 Medial leg/lat thigh N N  L5 Lat. leg & dorsal foot N N  S1 post/lat foot/thigh/leg N N  S2 Post./med. thigh & leg N N   SOMATOSENSORY:  Any N & T in extremities or weakness: reports burning and tingling in  legs and especially feet going up to the feet. Reports he gets a sharp pain if he externally rotates left hip. Patient reports right hand tingles and that he gets aches in the right shoulder.        Sensation           Intact      Diminished         Absent  Light touch LEs Right forearm, 3rd and 5th fingers       COORDINATION: Finger to Nose: Dysmetric right Past Pointing:  right Slow speed and moderate pass pointing right UE Left UEs intact without pass pointing, but slow speed  MMT: Right UE: shoulder flexion -4/5, bicep -4/5, wrist flexors +3/5, wrist extensors  +3/5 and poor grip Left UE: shoulder flexion 5/5, bicep 5/5, wrist flexors 5/5, wrist extensors 5/5 and good 5/5 grip  Right LE: hip flexors 4/5, hip ABD +3/5, hip ADD +3/5, quads -4/5, hamstrings +3/5, DF 4/5 Left LE: hip flexors -5/5, hip ABD -4/5, hip ADD -4/5quads -5/5, hamsirings -5/5 and DF 5/5 Note: patient with bilateral tight hamstring muscles   Functional Mobility: Patient able to perform sit to stand with patient pushing/stabilizing with bilateral UEs on thighs with CGA.   Gait: Patient arrives ambulating with standup rollator walker. Patient ambulates with markedly decreased gait speed, decreased step length and decreased step height right LE. Patient with right knee flexion in midstance phase and with decreased heel strike and toe off right LE.  Scanning of visual environment with gait is: poor  Balance: TBA  OCULOMOTOR / VESTIBULAR TESTING: Oculomotor Exam- Room Light  Normal Abnormal Comments  Ocular Alignment N    Ocular ROM N    Spontaneous Nystagmus N    Gaze evoked Nystagmus   NT secondary to patient had episode of N &V after testing ocular ROM, smooth pursuits and saccades  Smooth Pursuit  Abn Saccadic eye movements noted; patient became nauseous and had episode of vomiting  Saccades N    VOR   NT secondary to patient had episode of N &V after testing ocular ROM, smooth pursuits and saccades  VOR Cancellation   NT secondary to patient had episode of N &V after testing ocular ROM, smooth pursuits and saccades  Left Head Impulse   NT secondary to patient had episode of N &V after testing ocular ROM, smooth pursuits and saccades and patient status post ACDF surgery in 01/2020  Right Head Impulse   NT secondary to patient had episode of N &V after testing ocular ROM, smooth pursuits and saccades and patient status post ACDF surgery in 01/2020   BPPV TESTS: Deferred secondary to patient had episode of N & V with oculomotor testing.  FUNCTIONAL OUTCOME MEASURES:  Results  Comments  5TSTS  Attempted but battery died on the stopwatch and patient fatigued and unable to attempt another trial; patient performed 4 sit to stands in 50 seconds; patient unable to perform without use of UEs for support and right LE became increasingly tremulous with attempts to stand  TUG 37.9 sec Falls risk; in need of intervention  FOTO 41/100 Given the patient's risk adjustment variables, like-patients nationally had a FS score of 58/100 at intake  10 meter Walking Speed TBA       PT Education - 05/21/20 0809    Education Details discussed plan of care    Person(s) Educated Patient;Spouse    Methods Explanation    Comprehension Verbalized understanding  PT Short Term Goals - 05/21/20 1555      PT SHORT TERM GOAL #1   Title Patient will be independent in home exercise program to improve strength/mobility for better functional independence with ADLs and for self-management.    Time 4    Period Weeks    Status New    Target Date 06/18/20             PT Long Term Goals - 05/21/20 1555      PT LONG TERM GOAL #1   Title Patient will have increased score of 10 or more points on the FOTO survey in order to improve mobility and functional independence with ADLs.    Baseline scored 41/100 on 05/21/2020;    Time 8    Period Weeks    Status New    Target Date 07/16/20      PT LONG TERM GOAL #2   Title Patient will reduce falls risk as indicated by decreased TUG time to less than 20 seconds.    Baseline scored 37.9 sec with TUG on 05/21/2020    Time 8    Period Weeks    Status New    Target Date 07/16/20      PT LONG TERM GOAL #3   Title Patient will increase right UE and LE gross strength to 4+/5 throughout to improve functional strength for independent gait, increased standing tolerance and increased ADL ability.    Baseline grossly +3/5 to -4/5 right UE and LE strength on 05/21/2020    Time 8    Period Weeks    Status New    Target Date 07/16/20      PT  LONG TERM GOAL #4   Title Patient will improve self-selected gait speed to 0.7 m/s or greater on 10 m walk to indicate reduced falls risk, improved mobility and independence with ADL's.    Time 8    Period Weeks    Status New    Target Date 07/16/20              Plan - 05/21/20 1550    Clinical Impression Statement Patient present to clinic with reports of unsteadiness, dizziness and needing to use AD. Patient poorly able to tolerate vestibulo-ocular testing this date as patient became nauseous and had episode of vomiting with ocular ROM, saccades and smooth pursuit testing. Patient with abnormal smooth pursuits which is potential indicator of a central sign. Patient with decreased right UE and LE strength. Patient with difficulty with transfers and ambulation with AD. In addition, patient fatigues quickly with mobility activities. Patient would benefit from PT services to address functional deficits and goals as set on plan of care.    Personal Factors and Comorbidities Comorbidity 3+;Time since onset of injury/illness/exacerbation    Comorbidities anxiety, back pain, COPD, headache, HTN, MI, sleep apnea, migraines    Examination-Activity Limitations Locomotion Level;Transfers;Bed Mobility;Stand;Stairs;Sleep;Squat;Bend    Examination-Participation Restrictions Driving;Medication Management    Stability/Clinical Decision Making Unstable/Unpredictable    Clinical Decision Making High    Rehab Potential Fair    PT Frequency 2x / week    PT Duration 8 weeks    PT Treatment/Interventions ADLs/Self Care Home Management;Canalith Repostioning;Moist Heat;Electrical Stimulation;Gait training;Stair training;Functional mobility training;Therapeutic activities;Therapeutic exercise;Balance training;Neuromuscular re-education;Patient/family education;Manual techniques;Passive range of motion;Vestibular    PT Next Visit Plan strength and balance exercises; seated head turning or visual tracking exercise  per patient tolerance    Consulted and Agree with Plan of Care Patient;Family member/caregiver  Patient will benefit from skilled therapeutic intervention in order to improve the following deficits and impairments:  Decreased activity tolerance, Decreased balance, Decreased cognition, Decreased mobility, Difficulty walking, Abnormal gait, Decreased range of motion, Dizziness, Pain, Impaired flexibility, Decreased strength, Impaired sensation  Visit Diagnosis: Dizziness and giddiness - Plan: PT plan of care cert/re-cert  Unsteadiness on feet - Plan: PT plan of care cert/re-cert  Muscle weakness (generalized) - Plan: PT plan of care cert/re-cert     Problem List Patient Active Problem List   Diagnosis Date Noted  . Degenerative spondylolisthesis 03/25/2020  . Physical deconditioning 03/04/2020  . Lobar pneumonia, unspecified organism (HCC) 03/04/2020  . Intractable hemiplegic migraine with status migrainosus 03/02/2020  . Current tobacco use 02/05/2020  . Abnormal findings on diagnostic imaging of lung 02/05/2020  . Shortness of breath 02/05/2020  . Herniated disc, cervical 01/23/2020  . Cervical spondylosis with myelopathy and radiculopathy 01/23/2020  . Pre-operative respiratory examination 01/12/2020  . Weakness on right side of face 12/22/2019  . Osteoarthritis of spine with radiculopathy, lumbar region 12/11/2019  . Intractable migraine with aura with status migrainosus 11/09/2019  . Former smoker 08/27/2018  . Benign essential HTN 08/27/2018  . Cough productive of purulent sputum 11/08/2017  . GERD (gastroesophageal reflux disease) 05/06/2017  . Contusion of hand 04/22/2017  . BP (high blood pressure) 04/22/2017  . Oxygen desaturation 04/22/2017  . Prostate lump 04/22/2017  . Unstable angina (HCC) 04/27/2016  . Chest pain 04/24/2016  . Morbid obesity (HCC) 01/29/2016  . Elevated ALT measurement 10/30/2015  . Nonspecific elevation of levels of transaminase  and lactic acid dehydrogenase (ldh) 10/30/2015  . Reduced libido 10/29/2015  . Hyperlipidemia 08/06/2015  . Anxiety 08/06/2015  . Atherosclerosis of coronary artery 08/06/2015  . Childhood asthma 08/06/2015  . Chronic obstructive pulmonary disease (HCC) 08/06/2015  . OSA (obstructive sleep apnea) 08/06/2015  . Anxiety disorder 08/06/2015  . Atherosclerosis of native coronary artery of native heart with stable angina pectoris (HCC) 08/06/2015  . Uncomplicated asthma 08/06/2015   Mardelle Matte PT, DPT [phone] Mardelle Matte 05/21/2020, 5:10 PM  [facility] MAIN [facility] [address] [place], [place], [address] Phone: [phone]   Fax:  [phone]  Name: CHADEN DOOM MRN: [id number] Date of Birth: June 30, 1971

## 2020-05-23 ENCOUNTER — Ambulatory Visit: Payer: BC Managed Care – PPO

## 2020-05-23 ENCOUNTER — Other Ambulatory Visit: Payer: Self-pay

## 2020-05-23 VITALS — BP 117/77 | HR 62

## 2020-05-23 DIAGNOSIS — R42 Dizziness and giddiness: Secondary | ICD-10-CM | POA: Diagnosis not present

## 2020-05-23 DIAGNOSIS — M6281 Muscle weakness (generalized): Secondary | ICD-10-CM

## 2020-05-23 DIAGNOSIS — R2681 Unsteadiness on feet: Secondary | ICD-10-CM

## 2020-05-23 NOTE — Therapy (Signed)
[facility] MAIN [facility] SERVICES [address] [place], [place], [address] Phone: [phone]   Fax:  [phone]  Physical Therapy Treatment  Patient Details  Name: Cory Espinoza MRN: [id number] Date of Birth: 06-Sep-1971 Referring Provider (PT): Dr. Steele Sizer   Encounter Date: 05/23/2020   PT End of Session - 05/23/20 0836    Visit Number 2    Number of Visits 17    Date for PT Re-Evaluation 07/16/20    Authorization Type eval: 05/21/20    PT Start Time 0840    PT Stop Time 0940    PT Time Calculation (min) 60 min    Equipment Utilized During Treatment Gait belt    Activity Tolerance Patient tolerated treatment well    Behavior During Therapy [facility] for tasks assessed/performed           Past Medical History:  Diagnosis Date  . Allergy   . Anginal pain (HCC)   . Anxiety   . Arthritis    lumbar spine  . Asthma   . Atrial fibrillation (HCC)   . Back pain    Severe Lumbar Pain  . CHF (congestive heart failure) (HCC)   . COPD (chronic obstructive pulmonary disease) (HCC)    Restrictive lung disease  . Coronary artery disease   . Dyspnea   . Dysrhythmia    afib  . GERD (gastroesophageal reflux disease)   . Headache    [name], onset October 2020, cluster headaches in the past  . History of kidney stones   . Hyperlipidemia   . Hypertension   . Myocardial infarction (HCC)    at age 89  . Pneumonia   . Restrictive lung disease   . Sleep apnea    unable to use cpap since onset of migraines    Past Surgical History:  Procedure Laterality Date  . ABDOMINAL EXPOSURE N/A 03/25/2020   Procedure: ABDOMINAL EXPOSURE;  Surgeon: Larina Earthly, MD;  Location: [facility] OR;  Service: Vascular;  Laterality: N/A;  . ANTERIOR CERVICAL DECOMP/DISCECTOMY FUSION N/A 01/23/2020   Procedure: ANTERIOR CERVICAL DECOMPRESSION FUSION - CERVICAL SIX-CERVICAL SEVEN;  Surgeon: Julio Sicks, MD;  Location: [facility] OR;  Service: Neurosurgery;  Laterality: N/A;   . ANTERIOR LUMBAR FUSION N/A 03/25/2020   Procedure: ANTERIOR LUMBAR INTERBODY FUSION LUMBAR FIVE-SACRAL ONE.;  Surgeon: Julio Sicks, MD;  Location: [facility] OR;  Service: Neurosurgery;  Laterality: N/A;  anterior  . BACK SURGERY  1996  . CARDIAC CATHETERIZATION Left 04/27/2016   Procedure: Left Heart Cath and Coronary Angiography;  Surgeon: Lamar Blinks, MD;  Location: [facility] INVASIVE CV LAB;  Service: Cardiovascular;  Laterality: Left;  . CARDIAC CATHETERIZATION N/A 04/27/2016   Procedure: Intravascular Pressure Wire/FFR Study;  Surgeon: Alwyn Pea, MD;  Location: [facility] INVASIVE CV LAB;  Service: Cardiovascular;  Laterality: N/A;  . CATHETERIZATION OF PULMONARY ARTERY WITH RETRIEVAL OF FOREIGN BODY Bilateral 04/07/2011   heart  . DEGENERATIVE SPONDYLOLISTHESIS    . KNEE ARTHROSCOPY Bilateral 2004  . LUMBAR DISC SURGERY  1999  . RADICULOPATHY, CERVICAL REGION    . TONSILLECTOMY      Vitals:   05/23/20 0844  BP: 117/77  Pulse: 62  SpO2: 100%     Subjective Assessment - 05/23/20 0835    Subjective Patient reports that he is having a migraine upon arrival today. States that it was a 10/10 last night and is an 8/10 today. No falls or stumbles reported since last therapy session. Waiting to hear  back from neurology about prescribing Lyrica.    Pertinent History Pt has a complex medical history. 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 He states that he fell 10-12 feet and struck his head, neck, and back. He was able to finish the course which took him approximately 45 minutes more to complete. He does endorse a second fall while finishing the course but did not suffer a second head injury. He states that he started getting headaches that day which have persisted since that time. 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 He does report improvement in his headaches recently. Neurology  was concerned about possible BPPV and have referred him for a vestibular evaluation. In addition since the injury, pt developed RUE and RLE pain and weakness. Pt states that NCV showed abnormal nerve conduction in RUE. He has since undergone a C7-7 ACDF on 01/23/20. He reports initial improvement in RUE strength, but states that he has a gradual return of his RUE weakness. He was also having significant RLE weakness and pain and underwent and L5-S1 anterior lumbar interbody fusion on 03/25/20. Patient reports that he does not have any back precautions but needs to wear the low back brace for one more month. Patient reports he has a follow-up appointment with the surgeon next month. Patient reports he has a follow-up appointment with Dr. Clelia CroftShaw, neurologist, in August. He arrives to therapy ambulating with an upright rollator walker. Pt denies any mention of concussion after his injury. He has been having cognitive issues since his injury and has previously had a heavy metals screen as well as neurocognitive testing. He has had an MRI of his brain with and without contrast on 12/18/19. Results were mildly motion degraded examination. No evidence of acute intracranial abnormality. Minimal chronic small vessel ischemic disease. He has tremor in both his hands. He has a positive family history of Parkinson's disease in his grandfather. He is complaining of constant dizziness and unsteadiness which are worse with activity. Patient states that he frequently loses his balance and his wife has to steady him.    Limitations Lifting;Standing;Walking;House hold activities    Diagnostic tests He has had an MRI of his brain with and without contrast on 12/18/19. Results were mildly motion degraded examination. No evidence of acute intracranial abnormality. Minimal chronic small vessel ischemic disease.    Patient Stated Goals to be able to ambulate with a cane or no AD, to be able to drive, to be able to ride his motorcycle     Currently in Pain? Yes    Pain Score 8     Pain Location Head    Pain Orientation --    Pain Descriptors / Indicators Headache    Pain Type Chronic pain    Pain Onset More than a month ago    Pain Frequency Several days a week                TREATMENT   Ther-ex  Extensive interim history review, education regarding possible migraine triggers, importance of stress/anxiety reduction, prolonged post-concussive symptoms, and possible benefit of CBT. Multiple handouts provided; Seated clams with green tband 2 x 10; Seated marches with green tband 2 x 10; Standing mini squats 2 x 5   Neuromuscular Re-education  Education about VRT after head trauma; Seated horizontal and vertical smooth pursuit eye tracking x 30s each. Pt became  nauseous after both requiring prolonged period of rest to recover. After vertical smooth pursuit eye tracking pt starts to dry heave; HEP issued to patient and wife for LE strengthening as well as smooth pursuit eye tracking. Extensive education about how to taper exercises based on symptom response.   Pt educated throughout session about proper posture and technique with exercises. Improved exercise technique, movement at target joints, use of target muscles after min to mod verbal, visual, tactile cues.    Extensive interim history review with patient and wife today. Provided education regarding possible migraine triggers, importance of stress/anxiety reduction, prolonged post-concussive symptoms, and possible benefit of CBT. Multiple handouts provided to patient and wife today. Initiated some low level LE strengthening with patient today. Also initiated horizontal and vertical smooth pursuit eye tracking exercise. Pt became nauseous after both requiring prolonged period of rest to recover. After vertical smooth pursuit eye tracking pt starts to dry heave into emesis bag. Pt encouraged to initiate HEP and taper as appropriate pending symptom response. Pt will  benefit from PT services to address deficits in strength, balance, dizziness, and mobility in order to improve function at home.                     PT Education - 05/23/20 0947    Education Details Migraine triggers, persistent post-concussive symptoms, HEP    Person(s) Educated Patient;Spouse    Methods Explanation;Handout    Comprehension Verbalized understanding            PT Short Term Goals - 05/21/20 1555      PT SHORT TERM GOAL #1   Title Patient will be independent in home exercise program to improve strength/mobility for better functional independence with ADLs and for self-management.    Time 4    Period Weeks    Status New    Target Date 06/18/20             PT Long Term Goals - 05/21/20 1555      PT LONG TERM GOAL #1   Title Patient will have increased score of 10 or more points on the FOTO survey in order to improve mobility and functional independence with ADLs.    Baseline scored 41/100 on 05/21/2020;    Time 8    Period Weeks    Status New    Target Date 07/16/20      PT LONG TERM GOAL #2   Title Patient will reduce falls risk as indicated by decreased TUG time to less than 20 seconds.    Baseline scored 37.9 sec with TUG on 05/21/2020    Time 8    Period Weeks    Status New    Target Date 07/16/20      PT LONG TERM GOAL #3   Title Patient will increase right UE and LE gross strength to 4+/5 throughout to improve functional strength for independent gait, increased standing tolerance and increased ADL ability.    Baseline grossly +3/5 to -4/5 right UE and LE strength on 05/21/2020    Time 8    Period Weeks    Status New    Target Date 07/16/20      PT LONG TERM GOAL #4   Title Patient will improve self-selected gait speed to 0.7 m/s or greater on 10 m walk to indicate reduced falls risk, improved mobility and independence with ADL's.    Time 8    Period Weeks    Status New  Target Date 07/16/20                 Plan -  05/23/20 0836    Clinical Impression Statement Extensive interim history review with patient and wife today. Provided education regarding possible migraine triggers, importance of stress/anxiety reduction, prolonged post-concussive symptoms, and possible benefit of CBT. Multiple handouts provided to patient and wife today. Initiated some low level LE strengthening with patient today. Also initiated horizontal and vertical smooth pursuit eye tracking exercise. Pt became nauseous after both requiring prolonged period of rest to recover. After vertical smooth pursuit eye tracking pt starts to dry heave into emesis bag. Pt encouraged to initiate HEP and taper as appropriate pending symptom response. Pt will benefit from PT services to address deficits in strength, balance, dizziness, and mobility in order to improve function at home.    Personal Factors and Comorbidities Comorbidity 3+;Time since onset of injury/illness/exacerbation    Comorbidities anxiety, back pain, COPD, headache, HTN, MI, sleep apnea, migraines    Examination-Activity Limitations Locomotion Level;Transfers;Bed Mobility;Stand;Stairs;Sleep;Squat;Bend    Examination-Participation Restrictions Driving;Medication Management    Stability/Clinical Decision Making Unstable/Unpredictable    Rehab Potential Fair    PT Frequency 2x / week    PT Duration 8 weeks    PT Treatment/Interventions ADLs/Self Care Home Management;Canalith Repostioning;Moist Heat;Electrical Stimulation;Gait training;Stair training;Functional mobility training;Therapeutic activities;Therapeutic exercise;Balance training;Neuromuscular re-education;Patient/family education;Manual techniques;Passive range of motion;Vestibular    PT Next Visit Plan strength and balance exercises; seated head turning or visual tracking exercise per patient tolerance    PT Home Exercise Plan Medbridge Access Code: [email or web address] (eye tracking horizontal/vertical, seated clams, seated marches, standing  mini squats, standing hip abduction (squats and abduction previously prescribed/performed by HHPT)    Consulted and Agree with Plan of Care Patient;Family member/caregiver           Patient will benefit from skilled therapeutic intervention in order to improve the following deficits and impairments:  Decreased activity tolerance, Decreased balance, Decreased cognition, Decreased mobility, Difficulty walking, Abnormal gait, Decreased range of motion, Dizziness, Pain, Impaired flexibility, Decreased strength, Impaired sensation  Visit Diagnosis: Dizziness and giddiness  Unsteadiness on feet  Muscle weakness (generalized)     Problem List Patient Active Problem List   Diagnosis Date Noted  . Degenerative spondylolisthesis 03/25/2020  . Physical deconditioning 03/04/2020  . Lobar pneumonia, unspecified organism (HCC) 03/04/2020  . Intractable hemiplegic migraine with status migrainosus 03/02/2020  . Current tobacco use 02/05/2020  . Abnormal findings on diagnostic imaging of lung 02/05/2020  . Shortness of breath 02/05/2020  . Herniated disc, cervical 01/23/2020  . Cervical spondylosis with myelopathy and radiculopathy 01/23/2020  . Pre-operative respiratory examination 01/12/2020  . Weakness on right side of face 12/22/2019  . Osteoarthritis of spine with radiculopathy, lumbar region 12/11/2019  . Intractable migraine with aura with status migrainosus 11/09/2019  . Former smoker 08/27/2018  . Benign essential HTN 08/27/2018  . Cough productive of purulent sputum 11/08/2017  . GERD (gastroesophageal reflux disease) 05/06/2017  . Contusion of hand 04/22/2017  . BP (high blood pressure) 04/22/2017  . Oxygen desaturation 04/22/2017  . Prostate lump 04/22/2017  . Unstable angina (HCC) 04/27/2016  . Chest pain 04/24/2016  . Morbid obesity (HCC) 01/29/2016  . Elevated ALT measurement 10/30/2015  . Nonspecific elevation of levels of transaminase and lactic acid dehydrogenase (ldh)  10/30/2015  . Reduced libido 10/29/2015  . Hyperlipidemia 08/06/2015  . Anxiety 08/06/2015  . Atherosclerosis of coronary artery 08/06/2015  . Childhood asthma 08/06/2015  . Chronic obstructive  pulmonary disease (HCC) 08/06/2015  . OSA (obstructive sleep apnea) 08/06/2015  . Anxiety disorder 08/06/2015  . Atherosclerosis of native coronary artery of native heart with stable angina pectoris (HCC) 08/06/2015  . Uncomplicated asthma 08/06/2015   Lynnea Maizes PT, DPT, GCS  Calistro Rauf 05/23/2020, 1:58 PM   [facility] MAIN [facility] SERVICES [address] [place], [place], [address] Phone: [phone]   Fax:  [phone]  Name: TALMAGE TEASTER MRN: [id number] Date of Birth: Aug 24, 1971

## 2020-05-23 NOTE — Patient Instructions (Signed)
Access Code: [email or web address] URL: [email or web address] Date: 05/23/2020 Prepared by: Ria Comment  Exercises Seated Horizontal Smooth Pursuit - 2-3 x daily - 7 x weekly - 1 reps - 15-30s hold Seated Vertical Smooth Pursuit - 2-3 x daily - 7 x weekly - 1 reps - 15-30s hold Seated Hip Abduction with Resistance - 2 x daily - 7 x weekly - 2 sets - 10 reps - 3s hold Seated March with Resistance - 2 x daily - 7 x weekly - 2 sets - 10 reps - 3s hold Mini Squat with Counter Support - 2 x daily - 7 x weekly - 2 sets - 10 reps Standing Hip Abduction with Counter Support - 2 x daily - 7 x weekly - 2 sets - 10 reps - 3s hold

## 2020-05-27 ENCOUNTER — Ambulatory Visit: Payer: BC Managed Care – PPO | Admitting: Physical Therapy

## 2020-05-29 ENCOUNTER — Ambulatory Visit: Payer: BC Managed Care – PPO | Admitting: Physical Therapy

## 2020-05-29 ENCOUNTER — Encounter: Payer: Self-pay | Admitting: Physical Therapy

## 2020-05-29 ENCOUNTER — Other Ambulatory Visit: Payer: Self-pay

## 2020-05-29 DIAGNOSIS — M6281 Muscle weakness (generalized): Secondary | ICD-10-CM

## 2020-05-29 DIAGNOSIS — R42 Dizziness and giddiness: Secondary | ICD-10-CM | POA: Diagnosis not present

## 2020-05-29 DIAGNOSIS — R2681 Unsteadiness on feet: Secondary | ICD-10-CM

## 2020-05-29 NOTE — Therapy (Signed)
[facility] MAIN [facility] SERVICES [address] [place], [place], [address] Phone: [phone]   Fax:  [phone]  Physical Therapy Treatment  Patient Details  Name: Cory Espinoza MRN: [id number] Date of Birth: 12-30-1970 Referring Provider (PT): Dr. Steele Sizer   Encounter Date: 05/29/2020   PT End of Session - 05/29/20 0807    Visit Number 3    Number of Visits 17    Date for PT Re-Evaluation 07/16/20    Authorization Type eval: 05/21/20    PT Start Time 0801    PT Stop Time 0845    PT Time Calculation (min) 44 min    Equipment Utilized During Treatment Gait belt    Activity Tolerance Patient tolerated treatment well    Behavior During Therapy [facility] for tasks assessed/performed           Past Medical History:  Diagnosis Date  . Allergy   . Anginal pain (HCC)   . Anxiety   . Arthritis    lumbar spine  . Asthma   . Atrial fibrillation (HCC)   . Back pain    Severe Lumbar Pain  . CHF (congestive heart failure) (HCC)   . COPD (chronic obstructive pulmonary disease) (HCC)    Restrictive lung disease  . Coronary artery disease   . Dyspnea   . Dysrhythmia    afib  . GERD (gastroesophageal reflux disease)   . Headache    [name], onset October 2020, cluster headaches in the past  . History of kidney stones   . Hyperlipidemia   . Hypertension   . Myocardial infarction (HCC)    at age 32  . Pneumonia   . Restrictive lung disease   . Sleep apnea    unable to use cpap since onset of migraines    Past Surgical History:  Procedure Laterality Date  . ABDOMINAL EXPOSURE N/A 03/25/2020   Procedure: ABDOMINAL EXPOSURE;  Surgeon: Larina Earthly, MD;  Location: [facility] OR;  Service: Vascular;  Laterality: N/A;  . ANTERIOR CERVICAL DECOMP/DISCECTOMY FUSION N/A 01/23/2020   Procedure: ANTERIOR CERVICAL DECOMPRESSION FUSION - CERVICAL SIX-CERVICAL SEVEN;  Surgeon: Julio Sicks, MD;  Location: [facility] OR;  Service: Neurosurgery;  Laterality: N/A;   . ANTERIOR LUMBAR FUSION N/A 03/25/2020   Procedure: ANTERIOR LUMBAR INTERBODY FUSION LUMBAR FIVE-SACRAL ONE.;  Surgeon: Julio Sicks, MD;  Location: [facility] OR;  Service: Neurosurgery;  Laterality: N/A;  anterior  . BACK SURGERY  1996  . CARDIAC CATHETERIZATION Left 04/27/2016   Procedure: Left Heart Cath and Coronary Angiography;  Surgeon: Lamar Blinks, MD;  Location: [facility] INVASIVE CV LAB;  Service: Cardiovascular;  Laterality: Left;  . CARDIAC CATHETERIZATION N/A 04/27/2016   Procedure: Intravascular Pressure Wire/FFR Study;  Surgeon: Alwyn Pea, MD;  Location: [facility] INVASIVE CV LAB;  Service: Cardiovascular;  Laterality: N/A;  . CATHETERIZATION OF PULMONARY ARTERY WITH RETRIEVAL OF FOREIGN BODY Bilateral 04/07/2011   heart  . DEGENERATIVE SPONDYLOLISTHESIS    . KNEE ARTHROSCOPY Bilateral 2004  . LUMBAR DISC SURGERY  1999  . RADICULOPATHY, CERVICAL REGION    . TONSILLECTOMY      There were no vitals filed for this visit.   Subjective Assessment - 05/29/20 0759    Subjective Patient reports that he is had a migraine tuesday and it is mild today 3/10. No falls or stumbles reported since last therapy session.    Pertinent History Pt has a complex medical history. 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 He states that he fell 10-12 feet and struck his head, neck, and back. He was able to finish the course which took him approximately 45 minutes more to complete. He does endorse a second fall while finishing the course but did not suffer a second head injury. He states that he started getting headaches that day which have persisted since that time. 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 He does report improvement in his headaches recently. Neurology was concerned about possible BPPV and have referred him for a vestibular evaluation. In addition since the injury, pt developed RUE  and RLE pain and weakness. Pt states that NCV showed abnormal nerve conduction in RUE. He has since undergone a C7-7 ACDF on 01/23/20. He reports initial improvement in RUE strength, but states that he has a gradual return of his RUE weakness. He was also having significant RLE weakness and pain and underwent and L5-S1 anterior lumbar interbody fusion on 03/25/20. Patient reports that he does not have any back precautions but needs to wear the low back brace for one more month. Patient reports he has a follow-up appointment with the surgeon next month. Patient reports he has a follow-up appointment with Dr. Clelia Croft, neurologist, in August. He arrives to therapy ambulating with an upright rollator walker. Pt denies any mention of concussion after his injury. He has been having cognitive issues since his injury and has previously had a heavy metals screen as well as neurocognitive testing. He has had an MRI of his brain with and without contrast on 12/18/19. Results were mildly motion degraded examination. No evidence of acute intracranial abnormality. Minimal chronic small vessel ischemic disease. He has tremor in both his hands. He has a positive family history of Parkinson's disease in his grandfather. He is complaining of constant dizziness and unsteadiness which are worse with activity. Patient states that he frequently loses his balance and his wife has to steady him.    Limitations Lifting;Standing;Walking;House hold activities    Diagnostic tests He has had an MRI of his brain with and without contrast on 12/18/19. Results were mildly motion degraded examination. No evidence of acute intracranial abnormality. Minimal chronic small vessel ischemic disease.    Patient Stated Goals to be able to ambulate with a cane or no AD, to be able to drive, to be able to ride his motorcycle    Currently in Pain? Yes    Pain Score 3     Pain Location Head    Pain Descriptors / Indicators Aching    Pain Type Chronic pain     Pain Onset More than a month ago    Multiple Pain Sites No           Therapeutic exercise:  Supine:  Knee flex / ext with assist  x 15 RLE Hookling marching BLE x 15 , 2 1/2 lbs Bridging x 15 SAQ x 15 BLE,2/1/2 lbs  Sidelying: Hip abd x 15 , BLE Assisted hip flex/ext in sidelying RLE x 15 x 2    Patient performed with instruction, verbal cues, tactile cues of therapist: goal: increase tissue extensibility, promote proper posture, improve mobility                           PT Education - 05/29/20 0806    Education Details HEP review    Person(s)  Educated Patient    Methods Explanation    Comprehension Verbalized understanding            PT Short Term Goals - 05/21/20 1555      PT SHORT TERM GOAL #1   Title Patient will be independent in home exercise program to improve strength/mobility for better functional independence with ADLs and for self-management.    Time 4    Period Weeks    Status New    Target Date 06/18/20             PT Long Term Goals - 05/21/20 1555      PT LONG TERM GOAL #1   Title Patient will have increased score of 10 or more points on the FOTO survey in order to improve mobility and functional independence with ADLs.    Baseline scored 41/100 on 05/21/2020;    Time 8    Period Weeks    Status New    Target Date 07/16/20      PT LONG TERM GOAL #2   Title Patient will reduce falls risk as indicated by decreased TUG time to less than 20 seconds.    Baseline scored 37.9 sec with TUG on 05/21/2020    Time 8    Period Weeks    Status New    Target Date 07/16/20      PT LONG TERM GOAL #3   Title Patient will increase right UE and LE gross strength to 4+/5 throughout to improve functional strength for independent gait, increased standing tolerance and increased ADL ability.    Baseline grossly +3/5 to -4/5 right UE and LE strength on 05/21/2020    Time 8    Period Weeks    Status New    Target Date 07/16/20      PT  LONG TERM GOAL #4   Title Patient will improve self-selected gait speed to 0.7 m/s or greater on 10 m walk to indicate reduced falls risk, improved mobility and independence with ADL's.    Time 8    Period Weeks    Status New    Target Date 07/16/20                 Plan - 05/29/20 0807    Clinical Impression Statement    Personal Factors and Comorbidities Comorbidity 3+;Time since onset of injury/illness/exacerbation    Comorbidities anxiety, back pain, COPD, headache, HTN, MI, sleep apnea, migraines    Examination-Activity Limitations Locomotion Level;Transfers;Bed Mobility;Stand;Stairs;Sleep;Squat;Bend    Examination-Participation Restrictions Driving;Medication Management    Stability/Clinical Decision Making Unstable/Unpredictable    Rehab Potential Fair    PT Frequency 2x / week    PT Duration 8 weeks    PT Treatment/Interventions ADLs/Self Care Home Management;Canalith Repostioning;Moist Heat;Electrical Stimulation;Gait training;Stair training;Functional mobility training;Therapeutic activities;Therapeutic exercise;Balance training;Neuromuscular re-education;Patient/family education;Manual techniques;Passive range of motion;Vestibular    PT Next Visit Plan strength and balance exercises; seated head turning or visual tracking exercise per patient tolerance    PT Home Exercise Plan Medbridge Access Code: [email or web address] (eye tracking horizontal/vertical, seated clams, seated marches, standing mini squats, standing hip abduction (squats and abduction previously prescribed/performed by HHPT)    Consulted and Agree with Plan of Care Patient;Family member/caregiver           Patient will benefit from skilled therapeutic intervention in order to improve the following deficits and impairments:  Decreased activity tolerance, Decreased balance, Decreased cognition, Decreased mobility, Difficulty walking, Abnormal gait, Decreased range of motion, Dizziness, Pain, Impaired flexibility,  Decreased strength, Impaired sensation  Visit Diagnosis: Unsteadiness on feet  Dizziness and giddiness  Muscle weakness (generalized)     Problem List Patient Active Problem List   Diagnosis Date Noted  . Degenerative spondylolisthesis 03/25/2020  . Physical deconditioning 03/04/2020  . Lobar pneumonia, unspecified organism (HCC) 03/04/2020  . Intractable hemiplegic migraine with status migrainosus 03/02/2020  . Current tobacco use 02/05/2020  . Abnormal findings on diagnostic imaging of lung 02/05/2020  . Shortness of breath 02/05/2020  . Herniated disc, cervical 01/23/2020  . Cervical spondylosis with myelopathy and radiculopathy 01/23/2020  . Pre-operative respiratory examination 01/12/2020  . Weakness on right side of face 12/22/2019  . Osteoarthritis of spine with radiculopathy, lumbar region 12/11/2019  . Intractable migraine with aura with status migrainosus 11/09/2019  . Former smoker 08/27/2018  . Benign essential HTN 08/27/2018  . Cough productive of purulent sputum 11/08/2017  . GERD (gastroesophageal reflux disease) 05/06/2017  . Contusion of hand 04/22/2017  . BP (high blood pressure) 04/22/2017  . Oxygen desaturation 04/22/2017  . Prostate lump 04/22/2017  . Unstable angina (HCC) 04/27/2016  . Chest pain 04/24/2016  . Morbid obesity (HCC) 01/29/2016  . Elevated ALT measurement 10/30/2015  . Nonspecific elevation of levels of transaminase and lactic acid dehydrogenase (ldh) 10/30/2015  . Reduced libido 10/29/2015  . Hyperlipidemia 08/06/2015  . Anxiety 08/06/2015  . Atherosclerosis of coronary artery 08/06/2015  . Childhood asthma 08/06/2015  . Chronic obstructive pulmonary disease (HCC) 08/06/2015  . OSA (obstructive sleep apnea) 08/06/2015  . Anxiety disorder 08/06/2015  . Atherosclerosis of native coronary artery of native heart with stable angina pectoris (HCC) 08/06/2015  . Uncomplicated asthma 08/06/2015    Ezekiel Ina, Wing  DPT 05/29/2020, 8:09 AM  [place] [facility] MAIN [facility] SERVICES [address] [place], [place], [address] Phone: [phone]   Fax:  [phone]  Name: Cory Espinoza MRN: [id number] Date of Birth: Jan 05, 1971

## 2020-06-04 ENCOUNTER — Encounter: Payer: BC Managed Care – PPO | Admitting: Physical Therapy

## 2020-06-05 ENCOUNTER — Encounter: Payer: Self-pay | Admitting: Physical Therapy

## 2020-06-05 ENCOUNTER — Telehealth: Payer: Self-pay | Admitting: Cardiovascular Disease

## 2020-06-05 ENCOUNTER — Ambulatory Visit: Payer: BC Managed Care – PPO | Admitting: Physical Therapy

## 2020-06-05 ENCOUNTER — Other Ambulatory Visit: Payer: Self-pay

## 2020-06-05 DIAGNOSIS — R42 Dizziness and giddiness: Secondary | ICD-10-CM

## 2020-06-05 DIAGNOSIS — M6281 Muscle weakness (generalized): Secondary | ICD-10-CM

## 2020-06-05 DIAGNOSIS — R2681 Unsteadiness on feet: Secondary | ICD-10-CM

## 2020-06-05 NOTE — Therapy (Signed)
[facility] MAIN [facility] SERVICES [address] [place], [place], [address] Phone: [phone]   Fax:  [phone]  Physical Therapy Treatment  Patient Details  Name: Cory Espinoza MRN: [id number] Date of Birth: [birth year] Referring Provider (PT): Dr. Steele Sizer   Encounter Date: 06/05/2020   PT End of Session - 06/05/20 0809    Visit Number 4    Number of Visits 17    Date for PT Re-Evaluation 07/16/20    Authorization Type eval: 05/21/20    Authorization Time Period PT FOTO completed;    PT Start Time 0802    PT Stop Time 0845    PT Time Calculation (min) 43 min    Equipment Utilized During Treatment Gait belt    Activity Tolerance Patient tolerated treatment well    Behavior During Therapy [facility] for tasks assessed/performed           Past Medical History:  Diagnosis Date  . Allergy   . Anginal pain (HCC)   . Anxiety   . Arthritis    lumbar spine  . Asthma   . Atrial fibrillation (HCC)   . Back pain    Severe Lumbar Pain  . CHF (congestive heart failure) (HCC)   . COPD (chronic obstructive pulmonary disease) (HCC)    Restrictive lung disease  . Coronary artery disease   . Dyspnea   . Dysrhythmia    afib  . GERD (gastroesophageal reflux disease)   . Headache    [name], onset October 2020, cluster headaches in the past  . History of kidney stones   . Hyperlipidemia   . Hypertension   . Myocardial infarction (HCC)    at age 49  . Pneumonia   . Restrictive lung disease   . Sleep apnea    unable to use cpap since onset of migraines    Past Surgical History:  Procedure Laterality Date  . ABDOMINAL EXPOSURE N/A 03/25/2020   Procedure: ABDOMINAL EXPOSURE;  Surgeon: Larina Earthly, MD;  Location: [facility] OR;  Service: Vascular;  Laterality: N/A;  . ANTERIOR CERVICAL DECOMP/DISCECTOMY FUSION N/A 01/23/2020   Procedure: ANTERIOR CERVICAL DECOMPRESSION FUSION - CERVICAL SIX-CERVICAL SEVEN;  Surgeon: Julio Sicks, MD;  Location:  [facility] OR;  Service: Neurosurgery;  Laterality: N/A;  . ANTERIOR LUMBAR FUSION N/A 03/25/2020   Procedure: ANTERIOR LUMBAR INTERBODY FUSION LUMBAR FIVE-SACRAL ONE.;  Surgeon: Julio Sicks, MD;  Location: [facility] OR;  Service: Neurosurgery;  Laterality: N/A;  anterior  . BACK SURGERY  1996  . CARDIAC CATHETERIZATION Left 04/27/2016   Procedure: Left Heart Cath and Coronary Angiography;  Surgeon: Lamar Blinks, MD;  Location: [facility] INVASIVE CV LAB;  Service: Cardiovascular;  Laterality: Left;  . CARDIAC CATHETERIZATION N/A 04/27/2016   Procedure: Intravascular Pressure Wire/FFR Study;  Surgeon: Alwyn Pea, MD;  Location: [facility] INVASIVE CV LAB;  Service: Cardiovascular;  Laterality: N/A;  . CATHETERIZATION OF PULMONARY ARTERY WITH RETRIEVAL OF FOREIGN BODY Bilateral 04/07/2011   heart  . DEGENERATIVE SPONDYLOLISTHESIS    . KNEE ARTHROSCOPY Bilateral 2004  . LUMBAR DISC SURGERY  1999  . RADICULOPATHY, CERVICAL REGION    . TONSILLECTOMY      There were no vitals filed for this visit.   Subjective Assessment - 06/05/20 0807    Subjective Patient reports increased BLE leg pain today. he reports starting Lyrica for last week with no signifiant relief. He has also been adherent with HEP with no difficulty;    Pertinent History Pt has  a complex medical history. 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 He states that he fell 10-12 feet and struck his head, neck, and back. He was able to finish the course which took him approximately 45 minutes more to complete. He does endorse a second fall while finishing the course but did not suffer a second head injury. He states that he started getting headaches that day which have persisted since that time. 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 He does report improvement in his headaches recently. Neurology was concerned about possible BPPV and have  referred him for a vestibular evaluation. In addition since the injury, pt developed RUE and RLE pain and weakness. Pt states that NCV showed abnormal nerve conduction in RUE. He has since undergone a C7-7 ACDF on 01/23/20. He reports initial improvement in RUE strength, but states that he has a gradual return of his RUE weakness. He was also having significant RLE weakness and pain and underwent and L5-S1 anterior lumbar interbody fusion on 03/25/20. Patient reports that he does not have any back precautions but needs to wear the low back brace for one more month. Patient reports he has a follow-up appointment with the surgeon next month. Patient reports he has a follow-up appointment with Dr. Clelia CroftShaw, neurologist, in August. He arrives to therapy ambulating with an upright rollator walker. Pt denies any mention of concussion after his injury. He has been having cognitive issues since his injury and has previously had a heavy metals screen as well as neurocognitive testing. He has had an MRI of his brain with and without contrast on 12/18/19. Results were mildly motion degraded examination. No evidence of acute intracranial abnormality. Minimal chronic small vessel ischemic disease. He has tremor in both his hands. He has a positive family history of Parkinson's disease in his grandfather. He is complaining of constant dizziness and unsteadiness which are worse with activity. Patient states that he frequently loses his balance and his wife has to steady him.    Limitations Lifting;Standing;Walking;House hold activities    Diagnostic tests He has had an MRI of his brain with and without contrast on 12/18/19. Results were mildly motion degraded examination. No evidence of acute intracranial abnormality. Minimal chronic small vessel ischemic disease.    Patient Stated Goals to be able to ambulate with a cane or no AD, to be able to drive, to be able to ride his motorcycle    Currently in Pain? Yes    Pain Score 10-Worst  pain ever    Pain Location Leg    Pain Orientation Right;Left;Posterior    Pain Descriptors / Indicators Sharp;Burning    Pain Type Chronic pain    Pain Onset More than a month ago    Pain Frequency Constant    Aggravating Factors  unsure    Pain Relieving Factors unsure    Effect of Pain on Daily Activities decreased activity tolerance;    Multiple Pain Sites No              TREATMENT: Warm up on Nustep level 2 x4 min (Unbilled);  Seated with 3# ankle weights: -LAQ 2x10 reps with cues to increase terminal knee extension for better quad strength and motor control;  Standing with 3# ankle weight: -hip abduction SLR x10 reps each; -hip flexion march x10 reps; bilaterally; -heel raises x15 reps, required min VCs to  keep knee straight for better quad control and stabilization;    Stepping over orange hurdle forward/backward x10 reps with B rail assist with cues for ankle DF at initial contact and increase step length for better gait safety;  Stepping over orange hurdle side stepping x5 reps each direction; Requires BUE rail assist for safety;   Leg press: BLE 25# x10 reps with min A for positioning and min VCs for exercise technique. Patient had increased difficulty pushing machine but then had trouble controlling eccentric control;   Long sitting ankle DF/PF for neural flossing x5 reps;   Patient does exhibit increased shakiness in BLE this morning. He reports that this is likely from fatigue after being up so much this morning from increased pain; He denies any increase in pain with exercise but does report fatigue. He did exhibit increased RLE knee instability during stance activities due to weakness;  Pt expressed desire to try TENs unit and lidocane patches for pain relief.   Recommended patient apply TENs patches to lumbar paraspinals to help reduce nerve pain down BLE;                     PT Education - 06/05/20 0809    Education Details LE  strengthening, HEP    Person(s) Educated Patient    Methods Explanation;Verbal cues    Comprehension Verbalized understanding;Returned demonstration;Verbal cues required;Need further instruction            PT Short Term Goals - 05/21/20 1555      PT SHORT TERM GOAL #1   Title Patient will be independent in home exercise program to improve strength/mobility for better functional independence with ADLs and for self-management.    Time 4    Period Weeks    Status New    Target Date 06/18/20             PT Long Term Goals - 05/21/20 1555      PT LONG TERM GOAL #1   Title Patient will have increased score of 10 or more points on the FOTO survey in order to improve mobility and functional independence with ADLs.    Baseline scored 41/100 on 05/21/2020;    Time 8    Period Weeks    Status New    Target Date 07/16/20      PT LONG TERM GOAL #2   Title Patient will reduce falls risk as indicated by decreased TUG time to less than 20 seconds.    Baseline scored 37.9 sec with TUG on 05/21/2020    Time 8    Period Weeks    Status New    Target Date 07/16/20      PT LONG TERM GOAL #3   Title Patient will increase right UE and LE gross strength to 4+/5 throughout to improve functional strength for independent gait, increased standing tolerance and increased ADL ability.    Baseline grossly +3/5 to -4/5 right UE and LE strength on 05/21/2020    Time 8    Period Weeks    Status New    Target Date 07/16/20      PT LONG TERM GOAL #4   Title Patient will improve self-selected gait speed to 0.7 m/s or greater on 10 m walk to indicate reduced falls risk, improved mobility and independence with ADL's.    Time 8    Period Weeks    Status New    Target Date 07/16/20  Plan - 06/05/20 0900    Clinical Impression Statement Patient motivated and participated well within session. He was instructed in advanced LE strengthening. utilized ankle weights for increased  resistance. he does require min Vcs for proper positioning and exercise technique. patient does exhibit increased RLE knee instabiltity with increased weight bearing requiring min A with standing exercise for balance control.reinforced HEP. patient denies any increase in pain with advanced exercise. He would benefit from additional skilled PT intervention to improve strength, balance and mobility;    Personal Factors and Comorbidities Comorbidity 3+;Time since onset of injury/illness/exacerbation    Comorbidities anxiety, back pain, COPD, headache, HTN, MI, sleep apnea, migraines    Examination-Activity Limitations Locomotion Level;Transfers;Bed Mobility;Stand;Stairs;Sleep;Squat;Bend    Examination-Participation Restrictions Driving;Medication Management    Stability/Clinical Decision Making Unstable/Unpredictable    Rehab Potential Fair    PT Frequency 2x / week    PT Duration 8 weeks    PT Treatment/Interventions ADLs/Self Care Home Management;Canalith Repostioning;Moist Heat;Electrical Stimulation;Gait training;Stair training;Functional mobility training;Therapeutic activities;Therapeutic exercise;Balance training;Neuromuscular re-education;Patient/family education;Manual techniques;Passive range of motion;Vestibular    PT Next Visit Plan strength and balance exercises; seated head turning or visual tracking exercise per patient tolerance    PT Home Exercise Plan Medbridge Access Code: [email or web address] (eye tracking horizontal/vertical, seated clams, seated marches, standing mini squats, standing hip abduction (squats and abduction previously prescribed/performed by HHPT)    Consulted and Agree with Plan of Care Patient;Family member/caregiver           Patient will benefit from skilled therapeutic intervention in order to improve the following deficits and impairments:  Decreased activity tolerance, Decreased balance, Decreased cognition, Decreased mobility, Difficulty walking, Abnormal gait, Decreased  range of motion, Dizziness, Pain, Impaired flexibility, Decreased strength, Impaired sensation  Visit Diagnosis: Unsteadiness on feet  Dizziness and giddiness  Muscle weakness (generalized)     Problem List Patient Active Problem List   Diagnosis Date Noted  . Degenerative spondylolisthesis 03/25/2020  . Physical deconditioning 03/04/2020  . Lobar pneumonia, unspecified organism (HCC) 03/04/2020  . Intractable hemiplegic migraine with status migrainosus 03/02/2020  . Current tobacco use 02/05/2020  . Abnormal findings on diagnostic imaging of lung 02/05/2020  . Shortness of breath 02/05/2020  . Herniated disc, cervical 01/23/2020  . Cervical spondylosis with myelopathy and radiculopathy 01/23/2020  . Pre-operative respiratory examination 01/12/2020  . Weakness on right side of face 12/22/2019  . Osteoarthritis of spine with radiculopathy, lumbar region 12/11/2019  . Intractable migraine with aura with status migrainosus 11/09/2019  . Former smoker 08/27/2018  . Benign essential HTN 08/27/2018  . Cough productive of purulent sputum 11/08/2017  . GERD (gastroesophageal reflux disease) 05/06/2017  . Contusion of hand 04/22/2017  . BP (high blood pressure) 04/22/2017  . Oxygen desaturation 04/22/2017  . Prostate lump 04/22/2017  . Unstable angina (HCC) 04/27/2016  . Chest pain 04/24/2016  . Morbid obesity (HCC) 01/29/2016  . Elevated ALT measurement 10/30/2015  . Nonspecific elevation of levels of transaminase and lactic acid dehydrogenase (ldh) 10/30/2015  . Reduced libido 10/29/2015  . Hyperlipidemia 08/06/2015  . Anxiety 08/06/2015  . Atherosclerosis of coronary artery 08/06/2015  . Childhood asthma 08/06/2015  . Chronic obstructive pulmonary disease (HCC) 08/06/2015  . OSA (obstructive sleep apnea) 08/06/2015  . Anxiety disorder 08/06/2015  . Atherosclerosis of native coronary artery of native heart with stable angina pectoris (HCC) 08/06/2015  . Uncomplicated asthma  08/06/2015    Shatika Grinnell PT, DPT 06/05/2020, 9:03 AM  [facility] MAIN [facility] SERVICES [address]  [place], [place], [address] Phone: [phone]   Fax:  [phone]  Name: KEADEN GUNNOE MRN: [id number] Date of Birth: 1971-02-05

## 2020-06-05 NOTE — Telephone Encounter (Signed)
Received request from [facility] Disability Determination Services  Placed in interoffice mail and sent to [facility]

## 2020-06-07 ENCOUNTER — Ambulatory Visit: Payer: BC Managed Care – PPO | Attending: Neurology

## 2020-06-07 ENCOUNTER — Other Ambulatory Visit: Payer: Self-pay

## 2020-06-07 DIAGNOSIS — R42 Dizziness and giddiness: Secondary | ICD-10-CM | POA: Diagnosis present

## 2020-06-07 DIAGNOSIS — R2681 Unsteadiness on feet: Secondary | ICD-10-CM | POA: Diagnosis present

## 2020-06-07 DIAGNOSIS — M6281 Muscle weakness (generalized): Secondary | ICD-10-CM | POA: Diagnosis present

## 2020-06-07 NOTE — Therapy (Signed)
[facility] [address] [place], [place], [address] Phone: [phone]   Fax:  [phone]  Physical Therapy Treatment  Patient Details  Name: Cory GriefJoseph D Espinoza MRN: [id number] Date of Birth: 01/07/1971 Referring Provider (PT): Dr. Steele SizerH Shah   Encounter Date: 06/07/2020   PT End of Session - 06/07/20 0851    Visit Number 5    Number of Visits 17    Date for PT Re-Evaluation 07/16/20    Authorization Type eval: 05/21/20    Authorization Time Period PT FOTO completed;    PT Start Time 0845    PT Stop Time 0930    PT Time Calculation (min) 45 min    Equipment Utilized During Treatment Gait belt    Activity Tolerance Patient tolerated treatment well    Behavior [facility] for tasks assessed/performed           Past Medical History:  Diagnosis Date  . Allergy   . Anginal pain (HCC)   . Anxiety   . Arthritis    lumbar spine  . Asthma   . Atrial fibrillation (HCC)   . Back pain    Severe Lumbar Pain  . CHF (congestive heart failure) (HCC)   . COPD (chronic obstructive pulmonary disease) (HCC)    Restrictive lung disease  . Coronary artery disease   . Dyspnea   . Dysrhythmia    afib  . GERD (gastroesophageal reflux disease)   . Headache    [name], onset October 2020, cluster headaches in the past  . History of kidney stones   . Hyperlipidemia   . Hypertension   . Myocardial infarction (HCC)    at age 49  . Pneumonia   . Restrictive lung disease   . Sleep apnea    unable to use cpap since onset of migraines    Past Surgical History:  Procedure Laterality Date  . ABDOMINAL EXPOSURE N/A 03/25/2020   Procedure: ABDOMINAL EXPOSURE;  Surgeon: Larina EarthlyEarly, Todd F, MD;  [facility];  Service: Vascular;  Laterality: N/A;  . ANTERIOR CERVICAL DECOMP/DISCECTOMY FUSION N/A 01/23/2020   Procedure: ANTERIOR CERVICAL DECOMPRESSION FUSION - CERVICAL SIX-CERVICAL SEVEN;  Surgeon: Julio SicksPool, Henry, MD;  Location: [facility]  OR;  Service: Neurosurgery;  Laterality: N/A;  . ANTERIOR LUMBAR FUSION N/A 03/25/2020   Procedure: ANTERIOR LUMBAR INTERBODY FUSION LUMBAR FIVE-SACRAL ONE.;  Surgeon: Julio SicksPool, Henry, MD;  Location: [facility] OR;  Service: Neurosurgery;  Laterality: N/A;  anterior  . BACK SURGERY  1996  . CARDIAC CATHETERIZATION Left 04/27/2016   Procedure: Left Heart Cath and Coronary Angiography;  Surgeon: Lamar BlinksBruce J Kowalski, MD;  Location: [facility] INVASIVE CV LAB;  Service: Cardiovascular;  Laterality: Left;  . CARDIAC CATHETERIZATION N/A 04/27/2016   Procedure: Intravascular Pressure Wire/FFR Study;  Surgeon: Alwyn Peawayne D Callwood, MD;  Location: [facility] INVASIVE CV LAB;  Service: Cardiovascular;  Laterality: N/A;  . CATHETERIZATION OF PULMONARY ARTERY WITH RETRIEVAL OF FOREIGN BODY Bilateral 04/07/2011   heart  . DEGENERATIVE SPONDYLOLISTHESIS    . KNEE ARTHROSCOPY Bilateral 2004  . LUMBAR DISC SURGERY  1999  . RADICULOPATHY, CERVICAL REGION    . TONSILLECTOMY      There were no vitals filed for this visit.   Subjective Assessment - 06/07/20 0844    Subjective Patient reports he was very tired/sore after the last therapy session however the following day he states that he has felt the best he has in 8 months. He has been adherent with HEP with  some minor increase in nausea when turning his eyes too far toward end range. He arrives reporting that in addition to his back pain he is having a migraine this morning which he rates as 8/10 pain.    Pertinent History Pt has a complex medical history. 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 He states that he fell 10-12 feet and struck his head, neck, and back. He was able to finish the course which took him approximately 45 minutes more to complete. He does endorse a second fall while finishing the course but did not suffer a second head injury. He states that he started getting headaches that day which have persisted since that time. He is currently  under the care of neurology who is treating him for hemiplegic migraines and R occipital neuralgia. He does report improvement in his headaches recently. Neurology was concerned about possible BPPV and have referred him for a vestibular evaluation. In addition since the injury, pt developed RUE and RLE pain and weakness. Pt states that NCV showed abnormal nerve conduction in RUE. He has since undergone a C7-7 ACDF on 01/23/20. He reports initial improvement in RUE strength, but states that he has a gradual return of his RUE weakness. He was also having significant RLE weakness and pain and underwent and L5-S1 anterior lumbar interbody fusion on 03/25/20. Patient reports that he does not have any back precautions but needs to wear the low back brace for one more month. Patient reports he has a follow-up appointment with the surgeon next month. Patient reports he has a follow-up appointment with Dr. Clelia Croft, neurologist, in August. He arrives to therapy ambulating with an upright rollator walker. Pt denies any mention of concussion after his injury. He has been having cognitive issues since his injury and has previously had a heavy metals screen as well as neurocognitive testing. He has had an MRI of his brain with and without contrast on 12/18/19. Results were mildly motion degraded examination. No evidence of acute intracranial abnormality. Minimal chronic small vessel ischemic disease. He has tremor in both his hands. He has a positive family history of Parkinson's disease in his grandfather. He is complaining of constant dizziness and unsteadiness which are worse with activity. Patient states that he frequently loses his balance and his wife has to steady him.    Limitations Lifting;Standing;Walking;House hold activities    Diagnostic tests He has had an MRI of his brain with and without contrast on 12/18/19. Results were mildly motion degraded examination. No evidence of acute intracranial abnormality. Minimal chronic  small vessel ischemic disease.    Patient Stated Goals to be able to ambulate with a cane or no AD, to be able to drive, to be able to ride his motorcycle    Currently in Pain? Yes    Pain Score 8     Pain Location Back    Pain Orientation --   "Center"   Pain Descriptors / Indicators Sharp    Pain Type Chronic pain    Pain Onset More than a month ago    Pain Frequency Constant    Multiple Pain Sites Yes    Pain Score 8    Pain Location Head    Pain Descriptors / Indicators Headache    Pain Type Chronic pain               TREATMENT   Ther-ex  Warm up on Nustep level 0 x  5 min for warm-up during history (3 minutes unbilled); Seated with 2# ankle weights: Marches 2 x 10 BLE; LAQ 2 x 10 reps with cues to increase terminal knee extension for better quad strength and motor control;  Standing with 2# ankle weight: Hip abduction SLR x10 reps each; Hip flexion march x10 reps; bilaterally; Heel raises x 10 reps; Standing mini squats x 10 with BUE support, cues for proper technique with weight on heels and slight forward hip hinge, one episodes of RLE buckling;   Neuromuscular Re-education  WBOS eyes open/closed x 30s each; NBOS eyes open/closed x 30s each; NBOS horizontal head turns x 30s each;   Pt educated throughout session about proper posture and technique with exercises. Improved exercise technique, movement at target joints, use of target muscles after min to mod verbal, visual, tactile cues.    Patient demonstrates excellent motivation during session today. Provided some therapeutic pain education during exercises today discussing the etiology of pain as well as nerve receptors and thresholds for nerve electrical stimulus. Smoke detector analogy utilized with patient. Decreased ankle weights to 2# and avoided leg press due to reported increase in back pain following session. However pt does report significant improvement the day following therapy so therapist explained  to patient that there is a level of soreness that is appropriate. Discussed the benefit of graded strengthening and activity. He denies any increase in pain or fatigue during session today. He did exhibit increased RLE knee instability during stance activities due to weakness. Pt encouraged to continue his HEP and follow-up as scheduled. Pt will benefit from PT services to address deficits in strength, balance, and mobility in order to return to full function at home.                          PT Short Term Goals - 05/21/20 1555      PT SHORT TERM GOAL #1   Title Patient will be independent in home exercise program to improve strength/mobility for better functional independence with ADLs and for self-management.    Time 4    Period Weeks    Status New    Target Date 06/18/20             PT Long Term Goals - 05/21/20 1555      PT LONG TERM GOAL #1   Title Patient will have increased score of 10 or more points on the FOTO survey in order to improve mobility and functional independence with ADLs.    Baseline scored 41/100 on 05/21/2020;    Time 8    Period Weeks    Status New    Target Date 07/16/20      PT LONG TERM GOAL #2   Title Patient will reduce falls risk as indicated by decreased TUG time to less than 20 seconds.    Baseline scored 37.9 sec with TUG on 05/21/2020    Time 8    Period Weeks    Status New    Target Date 07/16/20      PT LONG TERM GOAL #3   Title Patient will increase right UE and LE gross strength to 4+/5 throughout to improve functional strength for independent gait, increased standing tolerance and increased ADL ability.    Baseline grossly +3/5 to -4/5 right UE and LE strength on 05/21/2020    Time 8    Period Weeks    Status New    Target Date 07/16/20  PT LONG TERM GOAL #4   Title Patient will improve self-selected gait speed to 0.7 m/s or greater on 10 m walk to indicate reduced falls risk, improved mobility and independence  with ADL's.    Time 8    Period Weeks    Status New    Target Date 07/16/20                 Plan - 06/07/20 0854    Clinical Impression Statement Patient demonstrates excellent motivation during session today. Provided some therapeutic pain education during exercises today discussing the etiology of pain as well as nerve receptors and thresholds for nerve electrical stimulus. Smoke detector analogy utilized with patient. Decreased ankle weights to 2# and avoided leg press due to reported increase in back pain following session. However pt does report significant improvement the day following therapy so therapist explained to patient that there is a level of soreness that is appropriate. Discussed the benefit of graded strengthening and activity. He denies any increase in pain or fatigue during session today. He did exhibit increased RLE knee instability during stance activities due to weakness. Pt encouraged to continue his HEP and follow-up as scheduled. Pt will benefit from PT services to address deficits in strength, balance, and mobility in order to return to full function at home.    Personal Factors and Comorbidities Comorbidity 3+;Time since onset of injury/illness/exacerbation    Comorbidities anxiety, back pain, COPD, headache, HTN, MI, sleep apnea, migraines    Examination-Activity Limitations Locomotion Level;Transfers;Bed Mobility;Stand;Stairs;Sleep;Squat;Bend    Examination-Participation Restrictions Driving;Medication Management    Stability/Clinical Decision Making Unstable/Unpredictable    Rehab Potential Fair    PT Frequency 2x / week    PT Duration 8 weeks    PT Treatment/Interventions ADLs/Self Care Home Management;Canalith Repostioning;Moist Heat;Electrical Stimulation;Gait training;Stair training;Functional mobility training;Therapeutic activities;Therapeutic exercise;Balance training;Neuromuscular re-education;Patient/family education;Manual techniques;Passive range of  motion;Vestibular    PT Next Visit Plan strength and balance exercises; seated head turning or visual tracking exercise per patient tolerance    PT Home Exercise Plan Medbridge Access Code: [email or web address] (eye tracking horizontal/vertical, seated clams, seated marches, standing mini squats, standing hip abduction (squats and abduction previously prescribed/performed by HHPT)    Consulted and Agree with Plan of Care Patient;Family member/caregiver           Patient will benefit from skilled therapeutic intervention in order to improve the following deficits and impairments:  Decreased activity tolerance, Decreased balance, Decreased cognition, Decreased mobility, Difficulty walking, Abnormal gait, Decreased range of motion, Dizziness, Pain, Impaired flexibility, Decreased strength, Impaired sensation  Visit Diagnosis: Unsteadiness on feet  Muscle weakness (generalized)     Problem List Patient Active Problem List   Diagnosis Date Noted  . Degenerative spondylolisthesis 03/25/2020  . Physical deconditioning 03/04/2020  . Lobar pneumonia, unspecified organism (HCC) 03/04/2020  . Intractable hemiplegic migraine with status migrainosus 03/02/2020  . Current tobacco use 02/05/2020  . Abnormal findings on diagnostic imaging of lung 02/05/2020  . Shortness of breath 02/05/2020  . Herniated disc, cervical 01/23/2020  . Cervical spondylosis with myelopathy and radiculopathy 01/23/2020  . Pre-operative respiratory examination 01/12/2020  . Weakness on right side of face 12/22/2019  . Osteoarthritis of spine with radiculopathy, lumbar region 12/11/2019  . Intractable migraine with aura with status migrainosus 11/09/2019  . Former smoker 08/27/2018  . Benign essential HTN 08/27/2018  . Cough productive of purulent sputum 11/08/2017  . GERD (gastroesophageal reflux disease) 05/06/2017  . Contusion of hand 04/22/2017  . BP (high blood  pressure) 04/22/2017  . Oxygen desaturation 04/22/2017  .  Prostate lump 04/22/2017  . Unstable angina (HCC) 04/27/2016  . Chest pain 04/24/2016  . Morbid obesity (HCC) 01/29/2016  . Elevated ALT measurement 10/30/2015  . Nonspecific elevation of levels of transaminase and lactic acid dehydrogenase (ldh) 10/30/2015  . Reduced libido 10/29/2015  . Hyperlipidemia 08/06/2015  . Anxiety 08/06/2015  . Atherosclerosis of coronary artery 08/06/2015  . Childhood asthma 08/06/2015  . Chronic obstructive pulmonary disease (HCC) 08/06/2015  . OSA (obstructive sleep apnea) 08/06/2015  . Anxiety disorder 08/06/2015  . Atherosclerosis of native coronary artery of native heart with stable angina pectoris (HCC) 08/06/2015  . Uncomplicated asthma 08/06/2015   Lynnea Maizes PT, DPT, GCS  Darlin Stenseth 06/07/2020, 11:43 AM  [facility] MAIN [facility] SERVICES [address] [place], [place], [address] Phone: [phone]   Fax:  [phone]  Name: HAVIER DEEB MRN: [id number] Date of Birth: July 02, 1971

## 2020-06-11 ENCOUNTER — Ambulatory Visit: Payer: BC Managed Care – PPO

## 2020-06-11 ENCOUNTER — Other Ambulatory Visit: Payer: Self-pay

## 2020-06-11 DIAGNOSIS — R42 Dizziness and giddiness: Secondary | ICD-10-CM

## 2020-06-11 DIAGNOSIS — R2681 Unsteadiness on feet: Secondary | ICD-10-CM | POA: Diagnosis not present

## 2020-06-11 DIAGNOSIS — M6281 Muscle weakness (generalized): Secondary | ICD-10-CM

## 2020-06-11 NOTE — Therapy (Signed)
[facility] MAIN [facility] SERVICES [address] [place], [place], [address] Phone: [phone]   Fax:  [phone]  Physical Therapy Treatment  Patient Details  Name: Cory Espinoza MRN: [id number] Date of Birth: 10-13-1971 Referring Provider (PT): Dr. Steele Sizer   Encounter Date: 06/11/2020   PT End of Session - 06/11/20 0811    Visit Number 6    Number of Visits 17    Date for PT Re-Evaluation 07/16/20    Authorization Type eval: 05/21/20    Authorization Time Period PT FOTO completed;    PT Start Time 0805    PT Stop Time 0845    PT Time Calculation (min) 40 min    Equipment Utilized During Treatment Gait belt    Activity Tolerance Patient tolerated treatment well;No increased pain    Behavior During Therapy WFL for tasks assessed/performed           Past Medical History:  Diagnosis Date  . Allergy   . Anginal pain (HCC)   . Anxiety   . Arthritis    lumbar spine  . Asthma   . Atrial fibrillation (HCC)   . Back pain    Severe Lumbar Pain  . CHF (congestive heart failure) (HCC)   . COPD (chronic obstructive pulmonary disease) (HCC)    Restrictive lung disease  . Coronary artery disease   . Dyspnea   . Dysrhythmia    afib  . GERD (gastroesophageal reflux disease)   . Headache    [name], onset October 2020, cluster headaches in the past  . History of kidney stones   . Hyperlipidemia   . Hypertension   . Myocardial infarction (HCC)    at age 30  . Pneumonia   . Restrictive lung disease   . Sleep apnea    unable to use cpap since onset of migraines    Past Surgical History:  Procedure Laterality Date  . ABDOMINAL EXPOSURE N/A 03/25/2020   Procedure: ABDOMINAL EXPOSURE;  Surgeon: Larina Earthly, MD;  Location: [facility] OR;  Service: Vascular;  Laterality: N/A;  . ANTERIOR CERVICAL DECOMP/DISCECTOMY FUSION N/A 01/23/2020   Procedure: ANTERIOR CERVICAL DECOMPRESSION FUSION - CERVICAL SIX-CERVICAL SEVEN;  Surgeon: Julio Sicks,  MD;  Location: [facility] OR;  Service: Neurosurgery;  Laterality: N/A;  . ANTERIOR LUMBAR FUSION N/A 03/25/2020   Procedure: ANTERIOR LUMBAR INTERBODY FUSION LUMBAR FIVE-SACRAL ONE.;  Surgeon: Julio Sicks, MD;  Location: [facility] OR;  Service: Neurosurgery;  Laterality: N/A;  anterior  . BACK SURGERY  1996  . CARDIAC CATHETERIZATION Left 04/27/2016   Procedure: Left Heart Cath and Coronary Angiography;  Surgeon: Lamar Blinks, MD;  Location: [facility] INVASIVE CV LAB;  Service: Cardiovascular;  Laterality: Left;  . CARDIAC CATHETERIZATION N/A 04/27/2016   Procedure: Intravascular Pressure Wire/FFR Study;  Surgeon: Alwyn Pea, MD;  Location: [facility] INVASIVE CV LAB;  Service: Cardiovascular;  Laterality: N/A;  . CATHETERIZATION OF PULMONARY ARTERY WITH RETRIEVAL OF FOREIGN BODY Bilateral 04/07/2011   heart  . DEGENERATIVE SPONDYLOLISTHESIS    . KNEE ARTHROSCOPY Bilateral 2004  . LUMBAR DISC SURGERY  1999  . RADICULOPATHY, CERVICAL REGION    . TONSILLECTOMY      There were no vitals filed for this visit.   Subjective Assessment - 06/11/20 0809    Subjective Pt doing well today, a bit stiff all over. Pt has 10/10 pain in legs, but back feeling better. Pt reports brain study done on Thursday but no results as of yet.  Pertinent History Pt has a complex medical history. 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 He states that he fell 10-12 feet and struck his head, neck, and back. He was able to finish the course which took him approximately 45 minutes more to complete. He does endorse a second fall while finishing the course but did not suffer a second head injury. He states that he started getting headaches that day which have persisted since that time. 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 He does report improvement in his headaches recently. Neurology was concerned about possible BPPV and have  referred him for a vestibular evaluation. In addition since the injury, pt developed RUE and RLE pain and weakness. Pt states that NCV showed abnormal nerve conduction in RUE. He has since undergone a C7-7 ACDF on 01/23/20. He reports initial improvement in RUE strength, but states that he has a gradual return of his RUE weakness. He was also having significant RLE weakness and pain and underwent and L5-S1 anterior lumbar interbody fusion on 03/25/20. Patient reports that he does not have any back precautions but needs to wear the low back brace for one more month. Patient reports he has a follow-up appointment with the surgeon next month. Patient reports he has a follow-up appointment with Dr. Clelia Croft, neurologist, in August. He arrives to therapy ambulating with an upright rollator walker. Pt denies any mention of concussion after his injury. He has been having cognitive issues since his injury and has previously had a heavy metals screen as well as neurocognitive testing. He has had an MRI of his brain with and without contrast on 12/18/19. Results were mildly motion degraded examination. No evidence of acute intracranial abnormality. Minimal chronic small vessel ischemic disease. He has tremor in both his hands. He has a positive family history of Parkinson's disease in his grandfather. He is complaining of constant dizziness and unsteadiness which are worse with activity. Patient states that he frequently loses his balance and his wife has to steady him.    Currently in Pain? Yes    Pain Score 10-Worst pain ever    Pain Location Leg           INTERVENTION THIS DATE: Ther-ex  Nustep level 0 x for CNS modulation, Seat 11, Arms 11  Seated with 2# ankle weights; progressed to 3lb on LLE -Marches 2x10 BLE, cues for core bracing  -LAQ 2x10 reps, cues for TKE, good motor control with obvious fatigue/fasiculation on Right (increased to 3lb on Right)  -seated Right Ankle DF c 2lb AW 1x10   Standing with  2# ankle weight: Hip abduction SLR x10 reps each; Heel raises x 10 reps; STS from chair+2 airex     Neuromuscular Re-education  NBOS eyes closed 3x30s each; NBOS horizontal head turns x 30s each;     PT Short Term Goals - 05/21/20 1555      PT SHORT TERM GOAL #1   Title Patient will be independent in home exercise program to improve strength/mobility for better functional independence with ADLs and for self-management.    Time 4    Period Weeks    Status New    Target Date 06/18/20             PT Long Term Goals - 05/21/20 1555      PT LONG TERM GOAL #1  Title Patient will have increased score of 10 or more points on the FOTO survey in order to improve mobility and functional independence with ADLs.    Baseline scored 41/100 on 05/21/2020;    Time 8    Period Weeks    Status New    Target Date 07/16/20      PT LONG TERM GOAL #2   Title Patient will reduce falls risk as indicated by decreased TUG time to less than 20 seconds.    Baseline scored 37.9 sec with TUG on 05/21/2020    Time 8    Period Weeks    Status New    Target Date 07/16/20      PT LONG TERM GOAL #3   Title Patient will increase right UE and LE gross strength to 4+/5 throughout to improve functional strength for independent gait, increased standing tolerance and increased ADL ability.    Baseline grossly +3/5 to -4/5 right UE and LE strength on 05/21/2020    Time 8    Period Weeks    Status New    Target Date 07/16/20      PT LONG TERM GOAL #4   Title Patient will improve self-selected gait speed to 0.7 m/s or greater on 10 m walk to indicate reduced falls risk, improved mobility and independence with ADL's.    Time 8    Period Weeks    Status New    Target Date 07/16/20                 Plan - 06/11/20 8366    Clinical Impression Statement In general, patient demonstrating good tolerance to therapy session this date, reasonable accommodations are alllowed in-session to allow adequate  rest between activities as needed. Pt able to progress time on Nustep and progress weight on LLE. RLE weights still seem appropriate to level of neurological fatigue. Fasciculation of Rt calf easily overpowers the quads control of the knee joint making instances of buckling more common toward latter half of session. All interventional executed without any exacerbation of pain or other symptoms.  Pt given intermittent multimodal cues to teach best possible form with exercises. Pt continues to make steady progress toward most goals. No home exercise updates made at this time.    Personal Factors and Comorbidities Comorbidity 3+;Time since onset of injury/illness/exacerbation    Comorbidities anxiety, back pain, COPD, headache, HTN, MI, sleep apnea, migraines    Examination-Activity Limitations Locomotion Level;Transfers;Bed Mobility;Stand;Stairs;Sleep;Squat;Bend    Examination-Participation Restrictions Driving;Medication Management    Stability/Clinical Decision Making Unstable/Unpredictable    Clinical Decision Making High    Rehab Potential Fair    PT Frequency 2x / week    PT Duration 8 weeks    PT Treatment/Interventions ADLs/Self Care Home Management;Canalith Repostioning;Moist Heat;Electrical Stimulation;Gait training;Stair training;Functional mobility training;Therapeutic activities;Therapeutic exercise;Balance training;Neuromuscular re-education;Patient/family education;Manual techniques;Passive range of motion;Vestibular    PT Next Visit Plan strength and balance exercises; seated head turning or visual tracking exercise per patient tolerance    PT Home Exercise Plan Medbridge Access Code: [email or web address] (eye tracking horizontal/vertical, seated clams, seated marches, standing mini squats, standing hip abduction (squats and abduction previously prescribed/performed by HHPT)    Consulted and Agree with Plan of Care Patient;Family member/caregiver           Patient will benefit from skilled  therapeutic intervention in order to improve the following deficits and impairments:  Decreased activity tolerance, Decreased balance, Decreased cognition, Decreased mobility, Difficulty walking, Abnormal gait, Decreased range of motion, Dizziness,  Pain, Impaired flexibility, Decreased strength, Impaired sensation  Visit Diagnosis: Unsteadiness on feet  Muscle weakness (generalized)  Dizziness and giddiness     Problem List Patient Active Problem List   Diagnosis Date Noted  . Degenerative spondylolisthesis 03/25/2020  . Physical deconditioning 03/04/2020  . Lobar pneumonia, unspecified organism (HCC) 03/04/2020  . Intractable hemiplegic migraine with status migrainosus 03/02/2020  . Current tobacco use 02/05/2020  . Abnormal findings on diagnostic imaging of lung 02/05/2020  . Shortness of breath 02/05/2020  . Herniated disc, cervical 01/23/2020  . Cervical spondylosis with myelopathy and radiculopathy 01/23/2020  . Pre-operative respiratory examination 01/12/2020  . Weakness on right side of face 12/22/2019  . Osteoarthritis of spine with radiculopathy, lumbar region 12/11/2019  . Intractable migraine with aura with status migrainosus 11/09/2019  . Former smoker 08/27/2018  . Benign essential HTN 08/27/2018  . Cough productive of purulent sputum 11/08/2017  . GERD (gastroesophageal reflux disease) 05/06/2017  . Contusion of hand 04/22/2017  . BP (high blood pressure) 04/22/2017  . Oxygen desaturation 04/22/2017  . Prostate lump 04/22/2017  . Unstable angina (HCC) 04/27/2016  . Chest pain 04/24/2016  . Morbid obesity (HCC) 01/29/2016  . Elevated ALT measurement 10/30/2015  . Nonspecific elevation of levels of transaminase and lactic acid dehydrogenase (ldh) 10/30/2015  . Reduced libido 10/29/2015  . Hyperlipidemia 08/06/2015  . Anxiety 08/06/2015  . Atherosclerosis of coronary artery 08/06/2015  . Childhood asthma 08/06/2015  . Chronic obstructive pulmonary disease  (HCC) 08/06/2015  . OSA (obstructive sleep apnea) 08/06/2015  . Anxiety disorder 08/06/2015  . Atherosclerosis of native coronary artery of native heart with stable angina pectoris (HCC) 08/06/2015  . Uncomplicated asthma 08/06/2015    8:41 AM, 06/11/20 Rosamaria LintsAllan C Nafisah Runions, PT, DPT Physical Therapist - [facility]  Outpatient Physical Therapy- Main Campus [phone]     Rosamaria LintsBuccola,Loletta Harper C 06/11/2020, 8:14 AM  [facility] [address] [place], [place], [address] Phone: [phone]   Fax:  [phone]  Name: Cory Espinoza MRN: [id number] Date of Birth: 05/12/1971

## 2020-06-14 ENCOUNTER — Ambulatory Visit: Payer: BC Managed Care – PPO

## 2020-06-18 ENCOUNTER — Ambulatory Visit: Payer: BC Managed Care – PPO | Admitting: Physical Therapy

## 2020-06-18 ENCOUNTER — Encounter: Payer: Self-pay | Admitting: Physical Therapy

## 2020-06-18 ENCOUNTER — Encounter: Payer: BC Managed Care – PPO | Admitting: Physical Therapy

## 2020-06-18 ENCOUNTER — Other Ambulatory Visit: Payer: Self-pay

## 2020-06-18 DIAGNOSIS — R2681 Unsteadiness on feet: Secondary | ICD-10-CM

## 2020-06-18 DIAGNOSIS — M6281 Muscle weakness (generalized): Secondary | ICD-10-CM

## 2020-06-18 DIAGNOSIS — R42 Dizziness and giddiness: Secondary | ICD-10-CM

## 2020-06-18 NOTE — Therapy (Signed)
[facility] MAIN [facility] SERVICES [address] [place], [place], [address] Phone: [phone]   Fax:  [phone]  Physical Therapy Treatment  Patient Details  Name: Cory Espinoza MRN: [id number] Date of Birth: 05-18-1971 Referring Provider (PT): Dr. Steele Sizer   Encounter Date: 06/18/2020   PT End of Session - 06/18/20 0811    Visit Number 7    Number of Visits 17    Date for PT Re-Evaluation 07/16/20    Authorization Type eval: 05/21/20    Authorization Time Period PT FOTO completed;    PT Start Time 0801    PT Stop Time 0845    PT Time Calculation (min) 44 min    Equipment Utilized During Treatment Gait belt    Activity Tolerance Patient tolerated treatment well;No increased pain    Behavior During Therapy WFL for tasks assessed/performed           Past Medical History:  Diagnosis Date   Allergy    Anginal pain (HCC)    Anxiety    Arthritis    lumbar spine   Asthma    Atrial fibrillation (HCC)    Back pain    Severe Lumbar Pain   CHF (congestive heart failure) (HCC)    COPD (chronic obstructive pulmonary disease) (HCC)    Restrictive lung disease   Coronary artery disease    Dyspnea    Dysrhythmia    afib   GERD (gastroesophageal reflux disease)    Headache    [name], onset October 2020, cluster headaches in the past   History of kidney stones    Hyperlipidemia    Hypertension    Myocardial infarction (HCC)    at age 49   Pneumonia    Restrictive lung disease    Sleep apnea    unable to use cpap since onset of migraines    Past Surgical History:  Procedure Laterality Date   ABDOMINAL EXPOSURE N/A 03/25/2020   Procedure: ABDOMINAL EXPOSURE;  Surgeon: Larina Earthly, MD;  Location: [facility] OR;  Service: Vascular;  Laterality: N/A;   ANTERIOR CERVICAL DECOMP/DISCECTOMY FUSION N/A 01/23/2020   Procedure: ANTERIOR CERVICAL DECOMPRESSION FUSION - CERVICAL SIX-CERVICAL SEVEN;  Surgeon: Julio Sicks, MD;  Location: [facility] OR;  Service: Neurosurgery;  Laterality: N/A;   ANTERIOR LUMBAR FUSION N/A 03/25/2020   Procedure: ANTERIOR LUMBAR INTERBODY FUSION LUMBAR FIVE-SACRAL ONE.;  Surgeon: Julio Sicks, MD;  Location: [facility] OR;  Service: Neurosurgery;  Laterality: N/A;  anterior   BACK SURGERY  1996   CARDIAC CATHETERIZATION Left 04/27/2016   Procedure: Left Heart Cath and Coronary Angiography;  Surgeon: Lamar Blinks, MD;  Location: [facility] INVASIVE CV LAB;  Service: Cardiovascular;  Laterality: Left;   CARDIAC CATHETERIZATION N/A 04/27/2016   Procedure: Intravascular Pressure Wire/FFR Study;  Surgeon: Alwyn Pea, MD;  Location: [facility] INVASIVE CV LAB;  Service: Cardiovascular;  Laterality: N/A;   CATHETERIZATION OF PULMONARY ARTERY WITH RETRIEVAL OF FOREIGN BODY Bilateral 04/07/2011   heart   DEGENERATIVE SPONDYLOLISTHESIS     KNEE ARTHROSCOPY Bilateral 2004   LUMBAR DISC SURGERY  1999   RADICULOPATHY, CERVICAL REGION     TONSILLECTOMY      There were no vitals filed for this visit.   Subjective Assessment - 06/18/20 0804    Subjective Pt doing well today, a bit stiff all over. Pt reports doing a lot of walking yesterday in the heat.    Pertinent History Pt has a complex medical history. 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 He states that he fell 10-12 feet and struck his head, neck, and back. He was able to finish the course which took him approximately 45 minutes more to complete. He does endorse a second fall while finishing the course but did not suffer a second head injury. He states that he started getting headaches that day which have persisted since that time. 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 He does report improvement in his headaches recently. Neurology was concerned about possible BPPV and have referred him for a vestibular evaluation. In addition  since the injury, pt developed RUE and RLE pain and weakness. Pt states that NCV showed abnormal nerve conduction in RUE. He has since undergone a C7-7 ACDF on 01/23/20. He reports initial improvement in RUE strength, but states that he has a gradual return of his RUE weakness. He was also having significant RLE weakness and pain and underwent and L5-S1 anterior lumbar interbody fusion on 03/25/20. Patient reports that he does not have any back precautions but needs to wear the low back brace for one more month. Patient reports he has a follow-up appointment with the surgeon next month. Patient reports he has a follow-up appointment with Dr. Clelia Croft, neurologist, in August. He arrives to therapy ambulating with an upright rollator walker. Pt denies any mention of concussion after his injury. He has been having cognitive issues since his injury and has previously had a heavy metals screen as well as neurocognitive testing. He has had an MRI of his brain with and without contrast on 12/18/19. Results were mildly motion degraded examination. No evidence of acute intracranial abnormality. Minimal chronic small vessel ischemic disease. He has tremor in both his hands. He has a positive family history of Parkinson's disease in his grandfather. He is complaining of constant dizziness and unsteadiness which are worse with activity. Patient states that he frequently loses his balance and his wife has to steady him.    Limitations Lifting;Standing;Walking;House hold activities    Diagnostic tests He has had an MRI of his brain with and without contrast on 12/18/19. Results were mildly motion degraded examination. No evidence of acute intracranial abnormality. Minimal chronic small vessel ischemic disease.    Patient Stated Goals to be able to ambulate with a cane or no AD, to be able to drive, to be able to ride his motorcycle    Pain Onset More than a month ago            Treatment: Therapeutic exercise: Octane fitness  x 5 mins L 2- Patient fatigued quickly  Supine: 2 lbs BLE SLR x 15 BLE Hookling marching x 15  Hooklying abd/ER x 15 - RTB Bridging x 15 SAQ x 15 BLE Hip abd/add x 15, BLE Knee to  Chest  x 15, BLE  Sidelying:2 lbs for LLE Hip abd x 15 , BLE Flex/ext x 15, BLE   Patient performed with instruction, verbal cues, tactile cues of therapist: goal: increase tissue extensibility, promote proper posture, improve mobility                         PT Education - 06/18/20 0810    Education Details HEP    Person(s) Educated Patient    Methods Explanation    Comprehension Verbalized understanding;Returned demonstration;Verbal cues required;Tactile cues required;Need further instruction  PT Short Term Goals - 05/21/20 1555      PT SHORT TERM GOAL #1   Title Patient will be independent in home exercise program to improve strength/mobility for better functional independence with ADLs and for self-management.    Time 4    Period Weeks    Status New    Target Date 06/18/20             PT Long Term Goals - 05/21/20 1555      PT LONG TERM GOAL #1   Title Patient will have increased score of 10 or more points on the FOTO survey in order to improve mobility and functional independence with ADLs.    Baseline scored 41/100 on 05/21/2020;    Time 8    Period Weeks    Status New    Target Date 07/16/20      PT LONG TERM GOAL #2   Title Patient will reduce falls risk as indicated by decreased TUG time to less than 20 seconds.    Baseline scored 37.9 sec with TUG on 05/21/2020    Time 8    Period Weeks    Status New    Target Date 07/16/20      PT LONG TERM GOAL #3   Title Patient will increase right UE and LE gross strength to 4+/5 throughout to improve functional strength for independent gait, increased standing tolerance and increased ADL ability.    Baseline grossly +3/5 to -4/5 right UE and LE strength on 05/21/2020    Time 8    Period Weeks     Status New    Target Date 07/16/20      PT LONG TERM GOAL #4   Title Patient will improve self-selected gait speed to 0.7 m/s or greater on 10 m walk to indicate reduced falls risk, improved mobility and independence with ADL's.    Time 8    Period Weeks    Status New    Target Date 07/16/20                 Plan - 06/18/20 0811    Clinical Impression Statement Pt was able to perform all exercises and gait today with CGA.and Rollator.. Pt requires verbal, visual and tactile cues during exercise in order to complete tasks with proper form and technique. Pt would continue to benefit from skilled PT services in order to further improve strength to LE's in order to increase functional mobility and decrease risk of falls.    Personal Factors and Comorbidities Comorbidity 3+;Time since onset of injury/illness/exacerbation    Comorbidities anxiety, back pain, COPD, headache, HTN, MI, sleep apnea, migraines    Examination-Activity Limitations Locomotion Level;Transfers;Bed Mobility;Stand;Stairs;Sleep;Squat;Bend    Examination-Participation Restrictions Driving;Medication Management    Stability/Clinical Decision Making Unstable/Unpredictable    Rehab Potential Fair    PT Frequency 2x / week    PT Duration 8 weeks    PT Treatment/Interventions ADLs/Self Care Home Management;Canalith Repostioning;Moist Heat;Electrical Stimulation;Gait training;Stair training;Functional mobility training;Therapeutic activities;Therapeutic exercise;Balance training;Neuromuscular re-education;Patient/family education;Manual techniques;Passive range of motion;Vestibular    PT Next Visit Plan strength and balance exercises; seated head turning or visual tracking exercise per patient tolerance    PT Home Exercise Plan Medbridge Access Code: [email or web address] (eye tracking horizontal/vertical, seated clams, seated marches, standing mini squats, standing hip abduction (squats and abduction previously prescribed/performed by HHPT)     Consulted and Agree with Plan of Care Patient;Family member/caregiver           Patient  will benefit from skilled therapeutic intervention in order to improve the following deficits and impairments:  Decreased activity tolerance, Decreased balance, Decreased cognition, Decreased mobility, Difficulty walking, Abnormal gait, Decreased range of motion, Dizziness, Pain, Impaired flexibility, Decreased strength, Impaired sensation  Visit Diagnosis: Unsteadiness on feet  Muscle weakness (generalized)  Dizziness and giddiness     Problem List Patient Active Problem List   Diagnosis Date Noted   Degenerative spondylolisthesis 03/25/2020   Physical deconditioning 03/04/2020   Lobar pneumonia, unspecified organism (HCC) 03/04/2020   Intractable hemiplegic migraine with status migrainosus 03/02/2020   Current tobacco use 02/05/2020   Abnormal findings on diagnostic imaging of lung 02/05/2020   Shortness of breath 02/05/2020   Herniated disc, cervical 01/23/2020   Cervical spondylosis with myelopathy and radiculopathy 01/23/2020   Pre-operative respiratory examination 01/12/2020   Weakness on right side of face 12/22/2019   Osteoarthritis of spine with radiculopathy, lumbar region 12/11/2019   Intractable migraine with aura with status migrainosus 11/09/2019   Former smoker 08/27/2018   Benign essential HTN 08/27/2018   Cough productive of purulent sputum 11/08/2017   GERD (gastroesophageal reflux disease) 05/06/2017   Contusion of hand 04/22/2017   BP (high blood pressure) 04/22/2017   Oxygen desaturation 04/22/2017   Prostate lump 04/22/2017   Unstable angina (HCC) 04/27/2016   Chest pain 04/24/2016   Morbid obesity (HCC) 01/29/2016   Elevated ALT measurement 10/30/2015   Nonspecific elevation of levels of transaminase and lactic acid dehydrogenase (ldh) 10/30/2015   Reduced libido 10/29/2015   Hyperlipidemia 08/06/2015   Anxiety 08/06/2015    Atherosclerosis of coronary artery 08/06/2015   Childhood asthma 08/06/2015   Chronic obstructive pulmonary disease (HCC) 08/06/2015   OSA (obstructive sleep apnea) 08/06/2015   Anxiety disorder 08/06/2015   Atherosclerosis of native coronary artery of native heart with stable angina pectoris (HCC) 08/06/2015   Uncomplicated asthma 08/06/2015    Ezekiel InaMansfield, Lisha Vitale S, PT DPT 06/18/2020, 8:59 AM  [place] [facility] MAIN [facility] [address] [place], [place], [address] Phone: [phone]   Fax:  [phone]  Name: Christean GriefJoseph D Derego MRN: [id number] Date of Birth: 05/10/1971

## 2020-06-20 ENCOUNTER — Ambulatory Visit: Payer: BC Managed Care – PPO

## 2020-06-20 ENCOUNTER — Other Ambulatory Visit: Payer: Self-pay

## 2020-06-20 DIAGNOSIS — M6281 Muscle weakness (generalized): Secondary | ICD-10-CM

## 2020-06-20 DIAGNOSIS — R2681 Unsteadiness on feet: Secondary | ICD-10-CM | POA: Diagnosis not present

## 2020-06-20 NOTE — Therapy (Signed)
[facility], [place] MAIN [facility] SERVICES [address] [place], [place], [address] Phone: [phone]   Fax:  [phone]  Physical Therapy Treatment  Patient Details  Name: Cory Espinoza MRN: [id number] Date of Birth: November 18, 1971 Referring Provider (PT): Dr. Steele Sizer   Encounter Date: 06/20/2020   PT End of Session - 06/20/20 0855    Visit Number 8    Number of Visits 17    Date for PT Re-Evaluation 07/16/20    Authorization Type eval: 05/21/20    Authorization Time Period PT FOTO completed;    PT Start Time 0847    PT Stop Time 0930    PT Time Calculation (min) 43 min    Equipment Utilized During Treatment Gait belt    Activity Tolerance Patient tolerated treatment well;No increased pain    Behavior During Therapy WFL for tasks assessed/performed           Past Medical History:  Diagnosis Date  . Allergy   . Anginal pain (HCC)   . Anxiety   . Arthritis    lumbar spine  . Asthma   . Atrial fibrillation (HCC)   . Back pain    Severe Lumbar Pain  . CHF (congestive heart failure) (HCC)   . COPD (chronic obstructive pulmonary disease) (HCC)    Restrictive lung disease  . Coronary artery disease   . Dyspnea   . Dysrhythmia    afib  . GERD (gastroesophageal reflux disease)   . Headache    [name], onset October 2020, cluster headaches in the past  . History of kidney stones   . Hyperlipidemia   . Hypertension   . Myocardial infarction (HCC)    at age 72  . Pneumonia   . Restrictive lung disease   . Sleep apnea    unable to use cpap since onset of migraines    Past Surgical History:  Procedure Laterality Date  . ABDOMINAL EXPOSURE N/A 03/25/2020   Procedure: ABDOMINAL EXPOSURE;  Surgeon: Larina Earthly, MD;  Location: [facility] OR;  Service: Vascular;  Laterality: N/A;  . ANTERIOR CERVICAL DECOMP/DISCECTOMY FUSION N/A 01/23/2020   Procedure: ANTERIOR CERVICAL DECOMPRESSION FUSION - CERVICAL SIX-CERVICAL SEVEN;  Surgeon: Julio Sicks, MD;  Location: [facility] OR;  Service: Neurosurgery;  Laterality: N/A;  . ANTERIOR LUMBAR FUSION N/A 03/25/2020   Procedure: ANTERIOR LUMBAR INTERBODY FUSION LUMBAR FIVE-SACRAL ONE.;  Surgeon: Julio Sicks, MD;  Location: [facility] OR;  Service: Neurosurgery;  Laterality: N/A;  anterior  . BACK SURGERY  1996  . CARDIAC CATHETERIZATION Left 04/27/2016   Procedure: Left Heart Cath and Coronary Angiography;  Surgeon: Lamar Blinks, MD;  Location: [facility] INVASIVE CV LAB;  Service: Cardiovascular;  Laterality: Left;  . CARDIAC CATHETERIZATION N/A 04/27/2016   Procedure: Intravascular Pressure Wire/FFR Study;  Surgeon: Alwyn Pea, MD;  Location: [facility] INVASIVE CV LAB;  Service: Cardiovascular;  Laterality: N/A;  . CATHETERIZATION OF PULMONARY ARTERY WITH RETRIEVAL OF FOREIGN BODY Bilateral 04/07/2011   heart  . DEGENERATIVE SPONDYLOLISTHESIS    . KNEE ARTHROSCOPY Bilateral 2004  . LUMBAR DISC SURGERY  1999  . RADICULOPATHY, CERVICAL REGION    . TONSILLECTOMY      There were no vitals filed for this visit.   Subjective Assessment - 06/20/20 0853    Subjective Pt doing well today. Pt reports walking a lot the past few days and is feeling sore. No questions or concerns at this time.    Patient is accompained by: --  Pertinent History Pt has a complex medical history. 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 He states that he fell 10-12 feet and struck his head, neck, and back. He was able to finish the course which took him approximately 45 minutes more to complete. He does endorse a second fall while finishing the course but did not suffer a second head injury. He states that he started getting headaches that day which have persisted since that time. 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 He does report improvement in his headaches recently. Neurology was concerned about possible BPPV and  have referred him for a vestibular evaluation. In addition since the injury, pt developed RUE and RLE pain and weakness. Pt states that NCV showed abnormal nerve conduction in RUE. He has since undergone a C7-7 ACDF on 01/23/20. He reports initial improvement in RUE strength, but states that he has a gradual return of his RUE weakness. He was also having significant RLE weakness and pain and underwent and L5-S1 anterior lumbar interbody fusion on 03/25/20. Patient reports that he does not have any back precautions but needs to wear the low back brace for one more month. Patient reports he has a follow-up appointment with the surgeon next month. Patient reports he has a follow-up appointment with Dr. Clelia Croft, neurologist, in August. He arrives to therapy ambulating with an upright rollator walker. Pt denies any mention of concussion after his injury. He has been having cognitive issues since his injury and has previously had a heavy metals screen as well as neurocognitive testing. He has had an MRI of his brain with and without contrast on 12/18/19. Results were mildly motion degraded examination. No evidence of acute intracranial abnormality. Minimal chronic small vessel ischemic disease. He has tremor in both his hands. He has a positive family history of Parkinson's disease in his grandfather. He is complaining of constant dizziness and unsteadiness which are worse with activity. Patient states that he frequently loses his balance and his wife has to steady him.    Limitations Lifting;Standing;Walking;House hold activities    Diagnostic tests He has had an MRI of his brain with and without contrast on 12/18/19. Results were mildly motion degraded examination. No evidence of acute intracranial abnormality. Minimal chronic small vessel ischemic disease.    Patient Stated Goals to be able to ambulate with a cane or no AD, to be able to drive, to be able to ride his motorcycle    Currently in Pain? No/denies    Pain  Onset --             TREATMENT  Ther-ex  Octane fitness x 5 mins L2 during history taking (3 minutes unbilled)   Seated with 2# on R, 3# on L ankle weights Marches x20 BLE LAQ x 20 reps, good motor control with obvious fatigue/fasiculation on Right   Standing with 2# on R, 3# on L ankle weights: Hip abduction SLR x20 reps each; Hip extension SLR x20 reps each; Heel raises x20 reps; STS from chair +1 airex x 10,     Neuromuscular Re-education  NBOS on airex pad eyes open and closed 30s each; increase in postural sway with eyes closed NBOS on airex pad horizontal head and vertical head turns x 30s each;   Pt educated throughout session about proper posture and technique with exercises. Improved exercise technique, movement at target  joints, use of target muscles after min to mod verbal, visual, tactile cues.   Patient demonstrates excellent motivation during session today. He denies any increase in pain or fatigue during session today. Patient increased repetitions showing increased strength. He did exhibit RLE knee instability during sit to stand activities due to weakness. Patient progressed to balancing on airex foam. During eyes closed balance exercises, there was an increase in backward postural sway compared to eyes open. Pt encouraged to continue his HEP and follow-up as scheduled. Pt will benefit from PT services to address deficits in strength, balance, and mobility in order to return to full function at home.           PT Short Term Goals - 05/21/20 1555      PT SHORT TERM GOAL #1   Title Patient will be independent in home exercise program to improve strength/mobility for better functional independence with ADLs and for self-management.    Time 4    Period Weeks    Status New    Target Date 06/18/20             PT Long Term Goals - 05/21/20 1555      PT LONG TERM GOAL #1   Title Patient will have increased score of 10 or more points on the FOTO survey  in order to improve mobility and functional independence with ADLs.    Baseline scored 41/100 on 05/21/2020;    Time 8    Period Weeks    Status New    Target Date 07/16/20      PT LONG TERM GOAL #2   Title Patient will reduce falls risk as indicated by decreased TUG time to less than 20 seconds.    Baseline scored 37.9 sec with TUG on 05/21/2020    Time 8    Period Weeks    Status New    Target Date 07/16/20      PT LONG TERM GOAL #3   Title Patient will increase right UE and LE gross strength to 4+/5 throughout to improve functional strength for independent gait, increased standing tolerance and increased ADL ability.    Baseline grossly +3/5 to -4/5 right UE and LE strength on 05/21/2020    Time 8    Period Weeks    Status New    Target Date 07/16/20      PT LONG TERM GOAL #4   Title Patient will improve self-selected gait speed to 0.7 m/s or greater on 10 m walk to indicate reduced falls risk, improved mobility and independence with ADL's.    Time 8    Period Weeks    Status New    Target Date 07/16/20                 Plan - 06/20/20 0904    Clinical Impression Statement Patient demonstrates excellent motivation during session today. He denies any increase in pain or fatigue during session today. Patient increased repetitions showing increased strength. He did exhibit RLE knee instability during sit to stand activities due to weakness. Patient progressed to balancing on airex foam. During eyes closed balance exercises, there was an increase in backward postural sway compared to eyes open. Pt encouraged to continue his HEP and follow-up as scheduled. Pt will benefit from PT services to address deficits in strength, balance, and mobility in order to return to full function at home.    Personal Factors and Comorbidities Comorbidity 3+;Time since onset of injury/illness/exacerbation  Comorbidities anxiety, back pain, COPD, headache, HTN, MI, sleep apnea, migraines     Examination-Activity Limitations Locomotion Level;Transfers;Bed Mobility;Stand;Stairs;Sleep;Squat;Bend    Examination-Participation Restrictions Driving;Medication Management    Stability/Clinical Decision Making Unstable/Unpredictable    Rehab Potential Fair    PT Frequency 2x / week    PT Duration 8 weeks    PT Treatment/Interventions ADLs/Self Care Home Management;Canalith Repostioning;Moist Heat;Electrical Stimulation;Gait training;Stair training;Functional mobility training;Therapeutic activities;Therapeutic exercise;Balance training;Neuromuscular re-education;Patient/family education;Manual techniques;Passive range of motion;Vestibular    PT Next Visit Plan strength and balance exercises; seated head turning or visual tracking exercise per patient tolerance    PT Home Exercise Plan Medbridge Access Code: [email or web address] (eye tracking horizontal/vertical, seated clams, seated marches, standing mini squats, standing hip abduction (squats and abduction previously prescribed/performed by HHPT)    Consulted and Agree with Plan of Care Patient;Family member/caregiver           Patient will benefit from skilled therapeutic intervention in order to improve the following deficits and impairments:  Decreased activity tolerance, Decreased balance, Decreased cognition, Decreased mobility, Difficulty walking, Abnormal gait, Decreased range of motion, Dizziness, Pain, Impaired flexibility, Decreased strength, Impaired sensation  Visit Diagnosis: Unsteadiness on feet  Muscle weakness (generalized)     Problem List Patient Active Problem List   Diagnosis Date Noted  . Degenerative spondylolisthesis 03/25/2020  . Physical deconditioning 03/04/2020  . Lobar pneumonia, unspecified organism (HCC) 03/04/2020  . Intractable hemiplegic migraine with status migrainosus 03/02/2020  . Current tobacco use 02/05/2020  . Abnormal findings on diagnostic imaging of lung 02/05/2020  . Shortness of breath 02/05/2020   . Herniated disc, cervical 01/23/2020  . Cervical spondylosis with myelopathy and radiculopathy 01/23/2020  . Pre-operative respiratory examination 01/12/2020  . Weakness on right side of face 12/22/2019  . Osteoarthritis of spine with radiculopathy, lumbar region 12/11/2019  . Intractable migraine with aura with status migrainosus 11/09/2019  . Former smoker 08/27/2018  . Benign essential HTN 08/27/2018  . Cough productive of purulent sputum 11/08/2017  . GERD (gastroesophageal reflux disease) 05/06/2017  . Contusion of hand 04/22/2017  . BP (high blood pressure) 04/22/2017  . Oxygen desaturation 04/22/2017  . Prostate lump 04/22/2017  . Unstable angina (HCC) 04/27/2016  . Chest pain 04/24/2016  . Morbid obesity (HCC) 01/29/2016  . Elevated ALT measurement 10/30/2015  . Nonspecific elevation of levels of transaminase and lactic acid dehydrogenase (ldh) 10/30/2015  . Reduced libido 10/29/2015  . Hyperlipidemia 08/06/2015  . Anxiety 08/06/2015  . Atherosclerosis of coronary artery 08/06/2015  . Childhood asthma 08/06/2015  . Chronic obstructive pulmonary disease (HCC) 08/06/2015  . OSA (obstructive sleep apnea) 08/06/2015  . Anxiety disorder 08/06/2015  . Atherosclerosis of native coronary artery of native heart with stable angina pectoris (HCC) 08/06/2015  . Uncomplicated asthma 08/06/2015    This entire session was performed under direct supervision and direction of a licensed therapist/therapist assistant . I have personally read, edited and approve of the note as written.    Katherine BassetKimmie Lillianah Swartzentruber, SPT Lynnea MaizesJason D Huprich PT, DPT, GCS  Huprich,Jason 06/20/2020, 11:05 AM  [place] [facility] [address] [place], [place], [id number] Phone: [phone]   Fax:  [phone]  Name: Cory Espinoza MRN: [id number] Date of Birth: 02/23/1971

## 2020-06-21 ENCOUNTER — Ambulatory Visit: Payer: BC Managed Care – PPO

## 2020-06-25 ENCOUNTER — Encounter: Payer: Self-pay | Admitting: Physical Therapy

## 2020-06-25 ENCOUNTER — Encounter: Payer: BC Managed Care – PPO | Admitting: Physical Therapy

## 2020-06-25 ENCOUNTER — Ambulatory Visit: Payer: BC Managed Care – PPO

## 2020-06-25 ENCOUNTER — Other Ambulatory Visit: Payer: Self-pay

## 2020-06-25 DIAGNOSIS — R2681 Unsteadiness on feet: Secondary | ICD-10-CM

## 2020-06-25 DIAGNOSIS — R42 Dizziness and giddiness: Secondary | ICD-10-CM

## 2020-06-25 DIAGNOSIS — M6281 Muscle weakness (generalized): Secondary | ICD-10-CM

## 2020-06-25 NOTE — Therapy (Signed)
[facility] MAIN [facility] SERVICES [address] [place], [place], [address] Phone: [phone]   Fax:  [phone]  Physical Therapy Treatment  Patient Details  Name: Cory Espinoza MRN: [id number] Date of Birth: 1971/10/29 Referring Provider (PT): Dr. Steele Sizer   Encounter Date: 06/25/2020   PT End of Session - 06/25/20 0747    Visit Number 9    Number of Visits 17    Date for PT Re-Evaluation 07/16/20    Authorization Type eval: 05/21/20    Authorization Time Period PT FOTO completed;    PT Start Time 0800    PT Stop Time 0835    PT Time Calculation (min) 35 min    Equipment Utilized During Treatment Gait belt    Activity Tolerance Patient tolerated treatment well;No increased pain    Behavior During Therapy WFL for tasks assessed/performed           Past Medical History:  Diagnosis Date  . Allergy   . Anginal pain (HCC)   . Anxiety   . Arthritis    lumbar spine  . Asthma   . Atrial fibrillation (HCC)   . Back pain    Severe Lumbar Pain  . CHF (congestive heart failure) (HCC)   . COPD (chronic obstructive pulmonary disease) (HCC)    Restrictive lung disease  . Coronary artery disease   . Dyspnea   . Dysrhythmia    afib  . GERD (gastroesophageal reflux disease)   . Headache    [name], onset October 2020, cluster headaches in the past  . History of kidney stones   . Hyperlipidemia   . Hypertension   . Myocardial infarction (HCC)    at age 2  . Pneumonia   . Restrictive lung disease   . Sleep apnea    unable to use cpap since onset of migraines    Past Surgical History:  Procedure Laterality Date  . ABDOMINAL EXPOSURE N/A 03/25/2020   Procedure: ABDOMINAL EXPOSURE;  Surgeon: Larina Earthly, MD;  Location: [facility] OR;  Service: Vascular;  Laterality: N/A;  . ANTERIOR CERVICAL DECOMP/DISCECTOMY FUSION N/A 01/23/2020   Procedure: ANTERIOR CERVICAL DECOMPRESSION FUSION - CERVICAL SIX-CERVICAL SEVEN;  Surgeon: Julio Sicks, MD;  Location: [facility] OR;  Service: Neurosurgery;  Laterality: N/A;  . ANTERIOR LUMBAR FUSION N/A 03/25/2020   Procedure: ANTERIOR LUMBAR INTERBODY FUSION LUMBAR FIVE-SACRAL ONE.;  Surgeon: Julio Sicks, MD;  Location: [facility] OR;  Service: Neurosurgery;  Laterality: N/A;  anterior  . BACK SURGERY  1996  . CARDIAC CATHETERIZATION Left 04/27/2016   Procedure: Left Heart Cath and Coronary Angiography;  Surgeon: Lamar Blinks, MD;  Location: [facility] INVASIVE CV LAB;  Service: Cardiovascular;  Laterality: Left;  . CARDIAC CATHETERIZATION N/A 04/27/2016   Procedure: Intravascular Pressure Wire/FFR Study;  Surgeon: Alwyn Pea, MD;  Location: [facility] INVASIVE CV LAB;  Service: Cardiovascular;  Laterality: N/A;  . CATHETERIZATION OF PULMONARY ARTERY WITH RETRIEVAL OF FOREIGN BODY Bilateral 04/07/2011   heart  . DEGENERATIVE SPONDYLOLISTHESIS    . KNEE ARTHROSCOPY Bilateral 2004  . LUMBAR DISC SURGERY  1999  . RADICULOPATHY, CERVICAL REGION    . TONSILLECTOMY      There were no vitals filed for this visit.   Subjective Assessment - 06/25/20 0803    Subjective Patient stated he is doing okay, stated he was in bed most of the day due to a migraine, but today is a little better.    Pertinent History Pt has a  complex medical history. 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 He states that he fell 10-12 feet and struck his head, neck, and back. He was able to finish the course which took him approximately 45 minutes more to complete. He does endorse a second fall while finishing the course but did not suffer a second head injury. He states that he started getting headaches that day which have persisted since that time. 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 He does report improvement in his headaches recently. Neurology was concerned about possible BPPV and have referred him for a vestibular  evaluation. In addition since the injury, pt developed RUE and RLE pain and weakness. Pt states that NCV showed abnormal nerve conduction in RUE. He has since undergone a C7-7 ACDF on 01/23/20. He reports initial improvement in RUE strength, but states that he has a gradual return of his RUE weakness. He was also having significant RLE weakness and pain and underwent and L5-S1 anterior lumbar interbody fusion on 03/25/20. Patient reports that he does not have any back precautions but needs to wear the low back brace for one more month. Patient reports he has a follow-up appointment with the surgeon next month. Patient reports he has a follow-up appointment with Dr. Clelia Croft, neurologist, in August. He arrives to therapy ambulating with an upright rollator walker. Pt denies any mention of concussion after his injury. He has been having cognitive issues since his injury and has previously had a heavy metals screen as well as neurocognitive testing. He has had an MRI of his brain with and without contrast on 12/18/19. Results were mildly motion degraded examination. No evidence of acute intracranial abnormality. Minimal chronic small vessel ischemic disease. He has tremor in both his hands. He has a positive family history of Parkinson's disease in his grandfather. He is complaining of constant dizziness and unsteadiness which are worse with activity. Patient states that he frequently loses his balance and his wife has to steady him.    Limitations Lifting;Standing;Walking;House hold activities    Diagnostic tests He has had an MRI of his brain with and without contrast on 12/18/19. Results were mildly motion degraded examination. No evidence of acute intracranial abnormality. Minimal chronic small vessel ischemic disease.    Patient Stated Goals to be able to ambulate with a cane or no AD, to be able to drive, to be able to ride his motorcycle    Currently in Pain? Yes    Pain Score 9     Pain Location Leg    Pain  Orientation --   center   Pain Descriptors / Indicators Stabbing;Sharp    Pain Type Chronic pain    Pain Onset More than a month ago           TREATMENT   Ther-ex  [employer] fitness x 5 mins L2 during history taking (3 minutes unbilled)   Seated with 2# on R, 3# on L ankle weights LAQ x 20 reps, good motor control with obvious fatigue/fasiculation on Right   Standing with 2# on R, 3# on L ankle weights: Hip abduction SLR x20 reps each; Hip extension SLR x20 reps each; Heel raises x20 reps; STS from chair +1 airex 2 x 5  BAPS level 3 for R ankle, DF/PF and EV/IN 10-15 reps ea. Tactile cues decreased over time with repetition  Pt educated throughout  session about proper posture and technique with exercises. Improved exercise technique, movement at target joints, use of target muscles after min to mod verbal, visual, tactile cues.    Pt response/clinical impression: Pt exhibited poor ankle control on RLE, pt exhibit inversion of ankle while ambulating, and reported he rolled his ankle on Friday, would benefit from further ankle stability intervention. Session modified today due to pt's noticeable fatigue and reports of 9/10 at start of session.  Overall the patient demonstrated deficits that impede the patient's functional abilities, safety, and mobility and would benefit from continued skilled PT intervention.       PT Education - 06/25/20 0746    Education Details therex    Person(s) Educated Patient    Methods Explanation    Comprehension Verbalized understanding;Returned demonstration;Verbal cues required;Tactile cues required;Need further instruction            PT Short Term Goals - 05/21/20 1555      PT SHORT TERM GOAL #1   Title Patient will be independent in home exercise program to improve strength/mobility for better functional independence with ADLs and for self-management.    Time 4    Period Weeks    Status New    Target Date 06/18/20             PT Long  Term Goals - 05/21/20 1555      PT LONG TERM GOAL #1   Title Patient will have increased score of 10 or more points on the FOTO survey in order to improve mobility and functional independence with ADLs.    Baseline scored 41/100 on 05/21/2020;    Time 8    Period Weeks    Status New    Target Date 07/16/20      PT LONG TERM GOAL #2   Title Patient will reduce falls risk as indicated by decreased TUG time to less than 20 seconds.    Baseline scored 37.9 sec with TUG on 05/21/2020    Time 8    Period Weeks    Status New    Target Date 07/16/20      PT LONG TERM GOAL #3   Title Patient will increase right UE and LE gross strength to 4+/5 throughout to improve functional strength for independent gait, increased standing tolerance and increased ADL ability.    Baseline grossly +3/5 to -4/5 right UE and LE strength on 05/21/2020    Time 8    Period Weeks    Status New    Target Date 07/16/20      PT LONG TERM GOAL #4   Title Patient will improve self-selected gait speed to 0.7 m/s or greater on 10 m walk to indicate reduced falls risk, improved mobility and independence with ADL's.    Time 8    Period Weeks    Status New    Target Date 07/16/20                 Plan - 06/25/20 0747    Clinical Impression Statement Pt exhibited poor ankle control on RLE, pt exhibit inversion of ankle while ambulating, and reported he rolled his ankle on Friday, would benefit from further ankle stability intervention. Session modified today due to pt's noticeable fatigue and reports of 9/10 at start of session.  Overall the patient demonstrated deficits that impede the patient's functional abilities, safety, and mobility and would benefit from continued skilled PT intervention.    Personal Factors and Comorbidities Comorbidity 3+;Time since  onset of injury/illness/exacerbation    Comorbidities anxiety, back pain, COPD, headache, HTN, MI, sleep apnea, migraines    Examination-Activity Limitations  Locomotion Level;Transfers;Bed Mobility;Stand;Stairs;Sleep;Squat;Bend    Examination-Participation Restrictions Driving;Medication Management    Stability/Clinical Decision Making Unstable/Unpredictable    Rehab Potential Fair    PT Frequency 2x / week    PT Duration 8 weeks    PT Treatment/Interventions ADLs/Self Care Home Management;Canalith Repostioning;Moist Heat;Electrical Stimulation;Gait training;Stair training;Functional mobility training;Therapeutic activities;Therapeutic exercise;Balance training;Neuromuscular re-education;Patient/family education;Manual techniques;Passive range of motion;Vestibular    PT Next Visit Plan strength and balance exercises; seated head turning or visual tracking exercise per patient tolerance    PT Home Exercise Plan Medbridge Access Code: [email or web address] (eye tracking horizontal/vertical, seated clams, seated marches, standing mini squats, standing hip abduction (squats and abduction previously prescribed/performed by HHPT)    Consulted and Agree with Plan of Care Patient           Patient will benefit from skilled therapeutic intervention in order to improve the following deficits and impairments:  Decreased activity tolerance, Decreased balance, Decreased cognition, Decreased mobility, Difficulty walking, Abnormal gait, Decreased range of motion, Dizziness, Pain, Impaired flexibility, Decreased strength, Impaired sensation  Visit Diagnosis: Unsteadiness on feet  Muscle weakness (generalized)  Dizziness and giddiness     Problem List Patient Active Problem List   Diagnosis Date Noted  . Degenerative spondylolisthesis 03/25/2020  . Physical deconditioning 03/04/2020  . Lobar pneumonia, unspecified organism (HCC) 03/04/2020  . Intractable hemiplegic migraine with status migrainosus 03/02/2020  . Current tobacco use 02/05/2020  . Abnormal findings on diagnostic imaging of lung 02/05/2020  . Shortness of breath 02/05/2020  . Herniated disc, cervical  01/23/2020  . Cervical spondylosis with myelopathy and radiculopathy 01/23/2020  . Pre-operative respiratory examination 01/12/2020  . Weakness on right side of face 12/22/2019  . Osteoarthritis of spine with radiculopathy, lumbar region 12/11/2019  . Intractable migraine with aura with status migrainosus 11/09/2019  . Former smoker 08/27/2018  . Benign essential HTN 08/27/2018  . Cough productive of purulent sputum 11/08/2017  . GERD (gastroesophageal reflux disease) 05/06/2017  . Contusion of hand 04/22/2017  . BP (high blood pressure) 04/22/2017  . Oxygen desaturation 04/22/2017  . Prostate lump 04/22/2017  . Unstable angina (HCC) 04/27/2016  . Chest pain 04/24/2016  . Morbid obesity (HCC) 01/29/2016  . Elevated ALT measurement 10/30/2015  . Nonspecific elevation of levels of transaminase and lactic acid dehydrogenase (ldh) 10/30/2015  . Reduced libido 10/29/2015  . Hyperlipidemia 08/06/2015  . Anxiety 08/06/2015  . Atherosclerosis of coronary artery 08/06/2015  . Childhood asthma 08/06/2015  . Chronic obstructive pulmonary disease (HCC) 08/06/2015  . OSA (obstructive sleep apnea) 08/06/2015  . Anxiety disorder 08/06/2015  . Atherosclerosis of native coronary artery of native heart with stable angina pectoris (HCC) 08/06/2015  . Uncomplicated asthma 08/06/2015    Olga Coasteriana Elease Swarm PT, DPT 8:45 AM,06/25/20   [facility] [address] [place], [place], [address] Phone: [phone]   Fax:  [phone]  Name: Cory Espinoza MRN: [id number] Date of Birth: 01/08/1971

## 2020-06-27 ENCOUNTER — Other Ambulatory Visit: Payer: Self-pay

## 2020-06-27 ENCOUNTER — Ambulatory Visit: Payer: BC Managed Care – PPO

## 2020-06-27 ENCOUNTER — Encounter: Payer: Self-pay | Admitting: Physical Therapy

## 2020-06-27 DIAGNOSIS — R42 Dizziness and giddiness: Secondary | ICD-10-CM

## 2020-06-27 DIAGNOSIS — R2681 Unsteadiness on feet: Secondary | ICD-10-CM

## 2020-06-27 DIAGNOSIS — M6281 Muscle weakness (generalized): Secondary | ICD-10-CM

## 2020-06-27 NOTE — Therapy (Signed)
[facility] MAIN [facility] SERVICES [address] [place], [place], [address] Phone: [phone]   Fax:  [phone]  Physical Therapy Treatment & Progress Note   Dates of reporting period  05/21/2020   to   06/27/20   Patient Details  Name: Cory Espinoza MRN: [id number] Date of Birth: 1971-08-26 Referring Provider (PT): Dr. Steele Sizer   Encounter Date: 06/27/2020   PT End of Session - 06/27/20 0929    Visit Number 10    Number of Visits 17    Date for PT Re-Evaluation 07/16/20    Authorization Type eval: 05/21/20    Authorization Time Period PT FOTO completed; FOTO performed 7/22 scored 40    PT Start Time 0930    PT Stop Time 1010    PT Time Calculation (min) 40 min    Equipment Utilized During Treatment Gait belt    Activity Tolerance Patient tolerated treatment well;No increased pain    Behavior During Therapy WFL for tasks assessed/performed           Past Medical History:  Diagnosis Date  . Allergy   . Anginal pain (HCC)   . Anxiety   . Arthritis    lumbar spine  . Asthma   . Atrial fibrillation (HCC)   . Back pain    Severe Lumbar Pain  . CHF (congestive heart failure) (HCC)   . COPD (chronic obstructive pulmonary disease) (HCC)    Restrictive lung disease  . Coronary artery disease   . Dyspnea   . Dysrhythmia    afib  . GERD (gastroesophageal reflux disease)   . Headache    [name], onset October 2020, cluster headaches in the past  . History of kidney stones   . Hyperlipidemia   . Hypertension   . Myocardial infarction (HCC)    at age 89  . Pneumonia   . Restrictive lung disease   . Sleep apnea    unable to use cpap since onset of migraines    Past Surgical History:  Procedure Laterality Date  . ABDOMINAL EXPOSURE N/A 03/25/2020   Procedure: ABDOMINAL EXPOSURE;  Surgeon: Larina Earthly, MD;  [facility];  Service: Vascular;  Laterality: N/A;  . ANTERIOR CERVICAL DECOMP/DISCECTOMY FUSION N/A 01/23/2020    Procedure: ANTERIOR CERVICAL DECOMPRESSION FUSION - CERVICAL SIX-CERVICAL SEVEN;  Surgeon: Julio Sicks, MD;  Location: [facility] OR;  Service: Neurosurgery;  Laterality: N/A;  . ANTERIOR LUMBAR FUSION N/A 03/25/2020   Procedure: ANTERIOR LUMBAR INTERBODY FUSION LUMBAR FIVE-SACRAL ONE.;  Surgeon: Julio Sicks, MD;  Location: [facility] OR;  Service: Neurosurgery;  Laterality: N/A;  anterior  . BACK SURGERY  1996  . CARDIAC CATHETERIZATION Left 04/27/2016   Procedure: Left Heart Cath and Coronary Angiography;  Surgeon: Lamar Blinks, MD;  Location: [facility] INVASIVE CV LAB;  Service: Cardiovascular;  Laterality: Left;  . CARDIAC CATHETERIZATION N/A 04/27/2016   Procedure: Intravascular Pressure Wire/FFR Study;  Surgeon: Alwyn Pea, MD;  Location: [facility] INVASIVE CV LAB;  Service: Cardiovascular;  Laterality: N/A;  . CATHETERIZATION OF PULMONARY ARTERY WITH RETRIEVAL OF FOREIGN BODY Bilateral 04/07/2011   heart  . DEGENERATIVE SPONDYLOLISTHESIS    . KNEE ARTHROSCOPY Bilateral 2004  . LUMBAR DISC SURGERY  1999  . RADICULOPATHY, CERVICAL REGION    . TONSILLECTOMY      There were no vitals filed for this visit.   Subjective Assessment - 06/27/20 0928    Subjective Patient stated that his back is popping that is aggravating  and somewhat painful. Today is a better day than Tuesday. Patient stated he thinks he has improved a lot since therapy started. Reported he has had a lot more strength, but still feels his R leg has not improved strength wise. He believes his walking has gotten better, able to walk further at time. Pt reported he still wants to work towards holding up his motorcycle, getting stronger, play with grandkids, perform chores, and ambulate better. Wants to get rid of elevated rollator, and use walking stick if able.    Pertinent History Pt has a complex medical history. 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 He states that he fell 10-12 feet and struck his  head, neck, and back. He was able to finish the course which took him approximately 45 minutes more to complete. He does endorse a second fall while finishing the course but did not suffer a second head injury. He states that he started getting headaches that day which have persisted since that time. 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 He does report improvement in his headaches recently. Neurology was concerned about possible BPPV and have referred him for a vestibular evaluation. In addition since the injury, pt developed RUE and RLE pain and weakness. Pt states that NCV showed abnormal nerve conduction in RUE. He has since undergone a C7-7 ACDF on 01/23/20. He reports initial improvement in RUE strength, but states that he has a gradual return of his RUE weakness. He was also having significant RLE weakness and pain and underwent and L5-S1 anterior lumbar interbody fusion on 03/25/20. Patient reports that he does not have any back precautions but needs to wear the low back brace for one more month. Patient reports he has a follow-up appointment with the surgeon next month. Patient reports he has a follow-up appointment with Dr. Clelia Croft, neurologist, in August. He arrives to therapy ambulating with an upright rollator walker. Pt denies any mention of concussion after his injury. He has been having cognitive issues since his injury and has previously had a heavy metals screen as well as neurocognitive testing. He has had an MRI of his brain with and without contrast on 12/18/19. Results were mildly motion degraded examination. No evidence of acute intracranial abnormality. Minimal chronic small vessel ischemic disease. He has tremor in both his hands. He has a positive family history of Parkinson's disease in his grandfather. He is complaining of constant dizziness and unsteadiness which are worse with activity. Patient states that he  frequently loses his balance and his wife has to steady him.    Limitations Lifting;Standing;Walking;House hold activities    Diagnostic tests He has had an MRI of his brain with and without contrast on 12/18/19. Results were mildly motion degraded examination. No evidence of acute intracranial abnormality. Minimal chronic small vessel ischemic disease.    Patient Stated Goals to be able to ambulate with a cane or no AD, to be able to drive, to be able to ride his motorcycle    Currently in Pain? Yes    Pain Score 8     Pain Location Leg    Pain Orientation --   center   Pain Descriptors / Indicators Stabbing;Sharp    Pain Type Chronic pain    Pain Onset More than a month ago             TREATMENT  goals reassessed this session:   Ther-ex   MMT UE MMT R/L 4-/5Flexion 3+/5Abduction 4-/5 Elbow flexion 4-/5 Elbow extension   MMT LE 4/5 Hip flexion 4-/5 Hip abduction 4-/5 Hip adduction 3+/5Knee flexion 3+/5 Knee extension 4-/5 Ankle DF 4-/5 PF 3+/5 EV 4-/5 INV 4/5 Big toe  10 MWT self selected and fast speed: 17.42sec self selected 13.54 with LOB, reported knee gave out on fast testing  TUG  FOTO: 40  BAPS: x2 min ea direction level 3     Seated with 2# on R, 3# on L ankle weights LAQ x 20 reps, good motor control with obvious fatigue/fasiculation on Right   Pt response/clinical impression: The patient reported improvement in condition since initial PT evaluation. Upon goal assessment pt demonstrated improvement, especially with TUG and 10MWT, minimal improvement noted in RLE strength. Pt very motivated to return to PLOF as able. The patient would benefit from further skilled PT intervention to continue to progress towards goals to maximize functional abilities.       PT Education - 06/27/20 0929    Education Details therex    Person(s) Educated Patient    Methods Explanation    Comprehension Verbalized understanding;Returned demonstration;Verbal cues  required;Tactile cues required;Need further instruction            PT Short Term Goals - 06/27/20 0936      PT SHORT TERM GOAL #1   Title Patient will be independent in home exercise program to improve strength/mobility for better functional independence with ADLs and for self-management.    Baseline Pt reported that he is consistent with HEP, independent    Time 4    Period Weeks    Status Achieved    Target Date 06/18/20             PT Long Term Goals - 06/27/20 0937      PT LONG TERM GOAL #1   Title Patient will have increased score of 10 or more points on the FOTO survey in order to improve mobility and functional independence with ADLs.    Baseline scored 41/100 on 05/21/2020; 7/22 40    Time 8    Period Weeks    Status On-going    Target Date 07/16/20      PT LONG TERM GOAL #2   Title Patient will reduce falls risk as indicated by decreased TUG time to less than 20 seconds.    Baseline scored 37.9 sec with TUG on 05/21/2020; 21seconds with elevated rollator 7/22    Time 8    Period Weeks    Status New    Target Date 07/16/20      PT LONG TERM GOAL #3   Title Patient will increase right UE and LE gross strength to 4+/5 throughout to improve functional strength for independent gait, increased standing tolerance and increased ADL ability.    Baseline grossly +3/5 to -4/5 right UE and LE strength on 05/21/2020; grossly 4-/5 for RUE, grossly 4-/5 for RLE on 7/22    Time 8    Period Weeks    Status On-going    Target Date 07/16/20      PT LONG TERM GOAL #4   Title Patient will improve self-selected gait speed to 0.7 m/s or greater on 10 m walk to indicate reduced falls risk, improved mobility and independence with ADL's.    Baseline on 7/22 .57 m/sself selected, fast  .73 m/s with LOB pt reported R knee buckling, elevated rollator used  Time 8    Period Weeks    Status New                 Plan - 06/27/20 1020    Clinical Impression Statement The patient  reported improvement in condition since initial PT evaluation. Upon goal assessment pt demonstrated improvement, especially with TUG and , minimal improvement noted in RLE strength. Pt very motivated to return to PLOF as able. The patient would benefit from further skilled PT intervention to continue to progress towards goals to maximize functional abilities.    Personal Factors and Comorbidities Comorbidity 3+;Time since onset of injury/illness/exacerbation    Comorbidities anxiety, back pain, COPD, headache, HTN, MI, sleep apnea, migraines    Examination-Activity Limitations Locomotion Level;Transfers;Bed Mobility;Stand;Stairs;Sleep;Squat;Bend    Examination-Participation Restrictions Driving;Medication Management    Stability/Clinical Decision Making Unstable/Unpredictable    Rehab Potential Fair    PT Frequency 2x / week    PT Duration 8 weeks    PT Treatment/Interventions ADLs/Self Care Home Management;Canalith Repostioning;Moist Heat;Electrical Stimulation;Gait training;Stair training;Functional mobility training;Therapeutic activities;Therapeutic exercise;Balance training;Neuromuscular re-education;Patient/family education;Manual techniques;Passive range of motion;Vestibular    PT Next Visit Plan strength and balance exercises; seated head turning or visual tracking exercise per patient tolerance    PT Home Exercise Plan Medbridge Access Code: [email or web address] (eye tracking horizontal/vertical, seated clams, seated marches, standing mini squats, standing hip abduction (squats and abduction previously prescribed/performed by HHPT)    Consulted and Agree with Plan of Care Patient           Patient will benefit from skilled therapeutic intervention in order to improve the following deficits and impairments:  Decreased activity tolerance, Decreased balance, Decreased cognition, Decreased mobility, Difficulty walking, Abnormal gait, Decreased range of motion, Dizziness, Pain, Impaired flexibility,  Decreased strength, Impaired sensation  Visit Diagnosis: Unsteadiness on feet  Muscle weakness (generalized)  Dizziness and giddiness     Problem List Patient Active Problem List   Diagnosis Date Noted  . Degenerative spondylolisthesis 03/25/2020  . Physical deconditioning 03/04/2020  . Lobar pneumonia, unspecified organism (HCC) 03/04/2020  . Intractable hemiplegic migraine with status migrainosus 03/02/2020  . Current tobacco use 02/05/2020  . Abnormal findings on diagnostic imaging of lung 02/05/2020  . Shortness of breath 02/05/2020  . Herniated disc, cervical 01/23/2020  . Cervical spondylosis with myelopathy and radiculopathy 01/23/2020  . Pre-operative respiratory examination 01/12/2020  . Weakness on right side of face 12/22/2019  . Osteoarthritis of spine with radiculopathy, lumbar region 12/11/2019  . Intractable migraine with aura with status migrainosus 11/09/2019  . Former smoker 08/27/2018  . Benign essential HTN 08/27/2018  . Cough productive of purulent sputum 11/08/2017  . GERD (gastroesophageal reflux disease) 05/06/2017  . Contusion of hand 04/22/2017  . BP (high blood pressure) 04/22/2017  . Oxygen desaturation 04/22/2017  . Prostate lump 04/22/2017  . Unstable angina (HCC) 04/27/2016  . Chest pain 04/24/2016  . Morbid obesity (HCC) 01/29/2016  . Elevated ALT measurement 10/30/2015  . Nonspecific elevation of levels of transaminase and lactic acid dehydrogenase (ldh) 10/30/2015  . Reduced libido 10/29/2015  . Hyperlipidemia 08/06/2015  . Anxiety 08/06/2015  . Atherosclerosis of coronary artery 08/06/2015  . Childhood asthma 08/06/2015  . Chronic obstructive pulmonary disease (HCC) 08/06/2015  . OSA (obstructive sleep apnea) 08/06/2015  . Anxiety disorder 08/06/2015  . Atherosclerosis of native coronary artery of native heart with stable angina pectoris (HCC) 08/06/2015  . Uncomplicated asthma 08/06/2015    Olga Coaster PT, DPT 10:30  AM,06/27/20   [place]  [facility] MAIN [facility] SERVICES [address] [place], [place], [address] Phone: [phone]   Fax:  [phone]  Name: Cory Espinoza MRN: [id number] Date of Birth: July 04, 1971

## 2020-06-28 ENCOUNTER — Ambulatory Visit: Payer: BC Managed Care – PPO

## 2020-07-02 ENCOUNTER — Encounter: Payer: BC Managed Care – PPO | Admitting: Physical Therapy

## 2020-07-02 ENCOUNTER — Ambulatory Visit: Payer: BC Managed Care – PPO | Admitting: Physical Therapy

## 2020-07-02 ENCOUNTER — Encounter: Payer: Self-pay | Admitting: Physical Therapy

## 2020-07-02 ENCOUNTER — Other Ambulatory Visit: Payer: Self-pay

## 2020-07-02 DIAGNOSIS — R42 Dizziness and giddiness: Secondary | ICD-10-CM

## 2020-07-02 DIAGNOSIS — M6281 Muscle weakness (generalized): Secondary | ICD-10-CM

## 2020-07-02 DIAGNOSIS — R2681 Unsteadiness on feet: Secondary | ICD-10-CM

## 2020-07-02 NOTE — Therapy (Signed)
[facility] MAIN [facility] SERVICES [address] [place], [place], [address] Phone: [phone]   Fax:  [phone]  Physical Therapy Treatment  Patient Details  Name: Cory Espinoza MRN: [id number] Date of Birth: December 10, 1970 Referring Provider (PT): Dr. Steele Sizer   Encounter Date: 07/02/2020   PT End of Session - 07/02/20 0803    Visit Number 11    Number of Visits 17    Date for PT Re-Evaluation 07/16/20    Authorization Type eval: 05/21/20    Authorization Time Period PT FOTO completed; FOTO performed 7/22 scored 40    PT Start Time 0800    PT Stop Time 0842    PT Time Calculation (min) 42 min    Equipment Utilized During Treatment Gait belt    Activity Tolerance Patient tolerated treatment well;No increased pain    Behavior During Therapy WFL for tasks assessed/performed           Past Medical History:  Diagnosis Date  . Allergy   . Anginal pain (HCC)   . Anxiety   . Arthritis    lumbar spine  . Asthma   . Atrial fibrillation (HCC)   . Back pain    Severe Lumbar Pain  . CHF (congestive heart failure) (HCC)   . COPD (chronic obstructive pulmonary disease) (HCC)    Restrictive lung disease  . Coronary artery disease   . Dyspnea   . Dysrhythmia    afib  . GERD (gastroesophageal reflux disease)   . Headache    [name], onset October 2020, cluster headaches in the past  . History of kidney stones   . Hyperlipidemia   . Hypertension   . Myocardial infarction (HCC)    at age 49  . Pneumonia   . Restrictive lung disease   . Sleep apnea    unable to use cpap since onset of migraines    Past Surgical History:  Procedure Laterality Date  . ABDOMINAL EXPOSURE N/A 03/25/2020   Procedure: ABDOMINAL EXPOSURE;  Surgeon: Larina Earthly, MD;  Location: [facility] OR;  Service: Vascular;  Laterality: N/A;  . ANTERIOR CERVICAL DECOMP/DISCECTOMY FUSION N/A 01/23/2020   Procedure: ANTERIOR CERVICAL DECOMPRESSION FUSION - CERVICAL  SIX-CERVICAL SEVEN;  Surgeon: Julio Sicks, MD;  Location: [facility] OR;  Service: Neurosurgery;  Laterality: N/A;  . ANTERIOR LUMBAR FUSION N/A 03/25/2020   Procedure: ANTERIOR LUMBAR INTERBODY FUSION LUMBAR FIVE-SACRAL ONE.;  Surgeon: Julio Sicks, MD;  Location: [facility] OR;  Service: Neurosurgery;  Laterality: N/A;  anterior  . BACK SURGERY  1996  . CARDIAC CATHETERIZATION Left 04/27/2016   Procedure: Left Heart Cath and Coronary Angiography;  Surgeon: Lamar Blinks, MD;  Location: [facility] INVASIVE CV LAB;  Service: Cardiovascular;  Laterality: Left;  . CARDIAC CATHETERIZATION N/A 04/27/2016   Procedure: Intravascular Pressure Wire/FFR Study;  Surgeon: Alwyn Pea, MD;  Location: [facility] INVASIVE CV LAB;  Service: Cardiovascular;  Laterality: N/A;  . CATHETERIZATION OF PULMONARY ARTERY WITH RETRIEVAL OF FOREIGN BODY Bilateral 04/07/2011   heart  . DEGENERATIVE SPONDYLOLISTHESIS    . KNEE ARTHROSCOPY Bilateral 2004  . LUMBAR DISC SURGERY  1999  . RADICULOPATHY, CERVICAL REGION    . TONSILLECTOMY      There were no vitals filed for this visit.   Subjective Assessment - 07/02/20 0801    Subjective Patient is having BLE leg pain 10/10. His back has been hurting in the AM, but now it is more painful in his legs. Patient is feeling very  discouraged today and is at his breaking point due to feeling he is not able to perform daily chores.    Pertinent History Pt has a complex medical history. 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 He states that he fell 10-12 feet and struck his head, neck, and back. He was able to finish the course which took him approximately 45 minutes more to complete. He does endorse a second fall while finishing the course but did not suffer a second head injury. He states that he started getting headaches that day which have persisted since that time. He is currently under the care of neurology who is treating him for hemiplegic migraines and R  occipital neuralgia. He does report improvement in his headaches recently. Neurology was concerned about possible BPPV and have referred him for a vestibular evaluation. In addition since the injury, pt developed RUE and RLE pain and weakness. Pt states that NCV showed abnormal nerve conduction in RUE. He has since undergone a C7-7 ACDF on 01/23/20. He reports initial improvement in RUE strength, but states that he has a gradual return of his RUE weakness. He was also having significant RLE weakness and pain and underwent and L5-S1 anterior lumbar interbody fusion on 03/25/20. Patient reports that he does not have any back precautions but needs to wear the low back brace for one more month. Patient reports he has a follow-up appointment with the surgeon next month. Patient reports he has a follow-up appointment with Dr. Clelia Croft, neurologist, in August. He arrives to therapy ambulating with an upright rollator walker. Pt denies any mention of concussion after his injury. He has been having cognitive issues since his injury and has previously had a heavy metals screen as well as neurocognitive testing. He has had an MRI of his brain with and without contrast on 12/18/19. Results were mildly motion degraded examination. No evidence of acute intracranial abnormality. Minimal chronic small vessel ischemic disease. He has tremor in both his hands. He has a positive family history of Parkinson's disease in his grandfather. He is complaining of constant dizziness and unsteadiness which are worse with activity. Patient states that he frequently loses his balance and his wife has to steady him.    Limitations Lifting;Standing;Walking;House hold activities    Diagnostic tests He has had an MRI of his brain with and without contrast on 12/18/19. Results were mildly motion degraded examination. No evidence of acute intracranial abnormality. Minimal chronic small vessel ischemic disease.    Patient Stated Goals to be able to ambulate  with a cane or no AD, to be able to drive, to be able to ride his motorcycle    Pain Score 10-Worst pain ever    Pain Location Leg    Pain Orientation Left;Right    Pain Onset More than a month ago           Ther-ex  Octane fitness L 2  x 5 mins  Lunges to BOSU ball x 15 BLE Heel raises x 15 x 2 sets Step ups to 6-inch stool from floor x 20  Lateral R Step ups to 6-inch stool from foam x 10 x 2 with R ankle eversion so need to stop exercise to avoid turning his ankle Sitting ankle Eversion with RTB, BTB x 20  Sitting R ankle DF with RTB,  x 20  Quadriped LE extension with assist to get into full R hip extension with 5 sec hold  x 10 reps Quadriped UE extension  with 5 sec hold x 5 reps Tall kneeling AI x 2 mins for better trunk and core strength Tall kneeling to bottom sitting on heels and back to tall kneeling x 5 reps, for eccentric strengthening  Patient needs occasional verbal cueing to improve posture and cueing to correctly perform exercises slowly, holding at end of range to increase motor firing of desired muscle to encourage fatigue.                            PT Education - 07/02/20 0803    Education Details HEP    Person(s) Educated Patient    Methods Explanation    Comprehension Verbal cues required;Need further instruction            PT Short Term Goals - 06/27/20 0936      PT SHORT TERM GOAL #1   Title Patient will be independent in home exercise program to improve strength/mobility for better functional independence with ADLs and for self-management.    Baseline Pt reported that he is consistent with HEP, independent    Time 4    Period Weeks    Status Achieved    Target Date 06/18/20             PT Long Term Goals - 06/27/20 0937      PT LONG TERM GOAL #1   Title Patient will have increased score of 10 or more points on the FOTO survey in order to improve mobility and functional independence with ADLs.    Baseline scored  41/100 on 05/21/2020; 7/22 40    Time 8    Period Weeks    Status On-going    Target Date 07/16/20      PT LONG TERM GOAL #2   Title Patient will reduce falls risk as indicated by decreased TUG time to less than 20 seconds.    Baseline scored 37.9 sec with TUG on 05/21/2020; 21seconds with elevated rollator 7/22    Time 8    Period Weeks    Status New    Target Date 07/16/20      PT LONG TERM GOAL #3   Title Patient will increase right UE and LE gross strength to 4+/5 throughout to improve functional strength for independent gait, increased standing tolerance and increased ADL ability.    Baseline grossly +3/5 to -4/5 right UE and LE strength on 05/21/2020; grossly 4-/5 for RUE, grossly 4-/5 for RLE on 7/22    Time 8    Period Weeks    Status On-going    Target Date 07/16/20      PT LONG TERM GOAL #4   Title Patient will improve self-selected gait speed to 0.7 m/s or greater on 10 m walk to indicate reduced falls risk, improved mobility and independence with ADL's.    Baseline on 7/22 .57 m/sself selected, fast  .73 m/s with LOB pt reported R knee buckling, elevated rollator used    Time 8    Period Weeks    Status New                 Plan - 07/02/20 [id number]    Clinical Impression Statement Pt presents today with soreness of BLE 10/10.Patient performs BLE closed and open chain exercise for strengthening   Pt continues to have increased weakness of L LE and core strength.   Pt would benefit from further skilled therapy  interventions in order to address continued weakness of LE's and core and decreased mobility.    Personal Factors and Comorbidities Comorbidity 3+;Time since onset of injury/illness/exacerbation    Comorbidities anxiety, back pain, COPD, headache, HTN, MI, sleep apnea, migraines    Examination-Activity Limitations Locomotion Level;Transfers;Bed Mobility;Stand;Stairs;Sleep;Squat;Bend    Examination-Participation Restrictions Driving;Medication Management     Stability/Clinical Decision Making Unstable/Unpredictable    Rehab Potential Fair    PT Frequency 2x / week    PT Duration 8 weeks    PT Treatment/Interventions ADLs/Self Care Home Management;Canalith Repostioning;Moist Heat;Electrical Stimulation;Gait training;Stair training;Functional mobility training;Therapeutic activities;Therapeutic exercise;Balance training;Neuromuscular re-education;Patient/family education;Manual techniques;Passive range of motion;Vestibular    PT Next Visit Plan strength and balance exercises; seated head turning or visual tracking exercise per patient tolerance    PT Home Exercise Plan Medbridge Access Code: [email or web address] (eye tracking horizontal/vertical, seated clams, seated marches, standing mini squats, standing hip abduction (squats and abduction previously prescribed/performed by HHPT)    Consulted and Agree with Plan of Care Patient           Patient will benefit from skilled therapeutic intervention in order to improve the following deficits and impairments:  Decreased activity tolerance, Decreased balance, Decreased cognition, Decreased mobility, Difficulty walking, Abnormal gait, Decreased range of motion, Dizziness, Pain, Impaired flexibility, Decreased strength, Impaired sensation  Visit Diagnosis: Unsteadiness on feet  Muscle weakness (generalized)  Dizziness and giddiness     Problem List Patient Active Problem List   Diagnosis Date Noted  . Degenerative spondylolisthesis 03/25/2020  . Physical deconditioning 03/04/2020  . Lobar pneumonia, unspecified organism (HCC) 03/04/2020  . Intractable hemiplegic migraine with status migrainosus 03/02/2020  . Current tobacco use 02/05/2020  . Abnormal findings on diagnostic imaging of lung 02/05/2020  . Shortness of breath 02/05/2020  . Herniated disc, cervical 01/23/2020  . Cervical spondylosis with myelopathy and radiculopathy 01/23/2020  . Pre-operative respiratory examination 01/12/2020  . Weakness  on right side of face 12/22/2019  . Osteoarthritis of spine with radiculopathy, lumbar region 12/11/2019  . Intractable migraine with aura with status migrainosus 11/09/2019  . Former smoker 08/27/2018  . Benign essential HTN 08/27/2018  . Cough productive of purulent sputum 11/08/2017  . GERD (gastroesophageal reflux disease) 05/06/2017  . Contusion of hand 04/22/2017  . BP (high blood pressure) 04/22/2017  . Oxygen desaturation 04/22/2017  . Prostate lump 04/22/2017  . Unstable angina (HCC) 04/27/2016  . Chest pain 04/24/2016  . Morbid obesity (HCC) 01/29/2016  . Elevated ALT measurement 10/30/2015  . Nonspecific elevation of levels of transaminase and lactic acid dehydrogenase (ldh) 10/30/2015  . Reduced libido 10/29/2015  . Hyperlipidemia 08/06/2015  . Anxiety 08/06/2015  . Atherosclerosis of coronary artery 08/06/2015  . Childhood asthma 08/06/2015  . Chronic obstructive pulmonary disease (HCC) 08/06/2015  . OSA (obstructive sleep apnea) 08/06/2015  . Anxiety disorder 08/06/2015  . Atherosclerosis of native coronary artery of native heart with stable angina pectoris (HCC) 08/06/2015  . Uncomplicated asthma 08/06/2015    Ezekiel Ina, [place] DPT 07/02/2020, 8:04 AM  [facility] MAIN [facility] SERVICES [address] [place], [place], [address] Phone: [phone]   Fax:  [phone]  Name: BANE HAGY MRN: [id number] Date of Birth: 1971-04-24

## 2020-07-04 ENCOUNTER — Other Ambulatory Visit: Payer: Self-pay

## 2020-07-04 ENCOUNTER — Ambulatory Visit: Payer: BC Managed Care – PPO

## 2020-07-04 DIAGNOSIS — R2681 Unsteadiness on feet: Secondary | ICD-10-CM

## 2020-07-04 DIAGNOSIS — M6281 Muscle weakness (generalized): Secondary | ICD-10-CM

## 2020-07-04 DIAGNOSIS — R42 Dizziness and giddiness: Secondary | ICD-10-CM

## 2020-07-04 NOTE — Therapy (Signed)
[facility] MAIN [facility] SERVICES [address] [place], [place], [address] Phone: [phone]   Fax:  [phone]  Physical Therapy Treatment  Patient Details  Name: Cory Espinoza MRN: [id number] Date of Birth: [birth year] Referring Provider (PT): Dr. Steele Sizer   Encounter Date: 07/04/2020   PT End of Session - 07/04/20 0816    Visit Number 12    Number of Visits 17    Date for PT Re-Evaluation 07/16/20    Authorization Type eval: 05/21/20    Authorization Time Period PT FOTO completed; FOTO performed 7/22 scored 40    PT Start Time 0845    PT Stop Time 0930    PT Time Calculation (min) 45 min    Equipment Utilized During Treatment Gait belt    Activity Tolerance Patient tolerated treatment well;No increased pain    Behavior During Therapy WFL for tasks assessed/performed           Past Medical History:  Diagnosis Date  . Allergy   . Anginal pain (HCC)   . Anxiety   . Arthritis    lumbar spine  . Asthma   . Atrial fibrillation (HCC)   . Back pain    Severe Lumbar Pain  . CHF (congestive heart failure) (HCC)   . COPD (chronic obstructive pulmonary disease) (HCC)    Restrictive lung disease  . Coronary artery disease   . Dyspnea   . Dysrhythmia    afib  . GERD (gastroesophageal reflux disease)   . Headache    [name], onset October 2020, cluster headaches in the past  . History of kidney stones   . Hyperlipidemia   . Hypertension   . Myocardial infarction (HCC)    at age 49  . Pneumonia   . Restrictive lung disease   . Sleep apnea    unable to use cpap since onset of migraines    Past Surgical History:  Procedure Laterality Date  . ABDOMINAL EXPOSURE N/A 03/25/2020   Procedure: ABDOMINAL EXPOSURE;  Surgeon: Larina Earthly, MD;  Location: [facility] OR;  Service: Vascular;  Laterality: N/A;  . ANTERIOR CERVICAL DECOMP/DISCECTOMY FUSION N/A 01/23/2020   Procedure: ANTERIOR CERVICAL DECOMPRESSION FUSION - CERVICAL  SIX-CERVICAL SEVEN;  Surgeon: Julio Sicks, MD;  Location: [facility] OR;  Service: Neurosurgery;  Laterality: N/A;  . ANTERIOR LUMBAR FUSION N/A 03/25/2020   Procedure: ANTERIOR LUMBAR INTERBODY FUSION LUMBAR FIVE-SACRAL ONE.;  Surgeon: Julio Sicks, MD;  Location: [facility] OR;  Service: Neurosurgery;  Laterality: N/A;  anterior  . BACK SURGERY  1996  . CARDIAC CATHETERIZATION Left 04/27/2016   Procedure: Left Heart Cath and Coronary Angiography;  Surgeon: Lamar Blinks, MD;  Location: [facility] INVASIVE CV LAB;  Service: Cardiovascular;  Laterality: Left;  . CARDIAC CATHETERIZATION N/A 04/27/2016   Procedure: Intravascular Pressure Wire/FFR Study;  Surgeon: Alwyn Pea, MD;  Location: [facility] INVASIVE CV LAB;  Service: Cardiovascular;  Laterality: N/A;  . CATHETERIZATION OF PULMONARY ARTERY WITH RETRIEVAL OF FOREIGN BODY Bilateral 04/07/2011   heart  . DEGENERATIVE SPONDYLOLISTHESIS    . KNEE ARTHROSCOPY Bilateral 2004  . LUMBAR DISC SURGERY  1999  . RADICULOPATHY, CERVICAL REGION    . TONSILLECTOMY      There were no vitals filed for this visit.   Subjective Assessment - 07/04/20 0816    Subjective Patient reports doing better today then previous days. Patient is having BLE leg pain 8/10. Patient is sore from exercises at home and during therapy. His pool was  finished yesterday, so will be in the pool this weekend. He reports no falls in the last week. No other updates or concerns.    Pertinent History Pt has a complex medical history. 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 He states that he fell 10-12 feet and struck his head, neck, and back. He was able to finish the course which took him approximately 45 minutes more to complete. He does endorse a second fall while finishing the course but did not suffer a second head injury. He states that he started getting headaches that day which have persisted since that time. He is currently under the care of neurology who is  treating him for hemiplegic migraines and R occipital neuralgia. He does report improvement in his headaches recently. Neurology was concerned about possible BPPV and have referred him for a vestibular evaluation. In addition since the injury, pt developed RUE and RLE pain and weakness. Pt states that NCV showed abnormal nerve conduction in RUE. He has since undergone a C7-7 ACDF on 01/23/20. He reports initial improvement in RUE strength, but states that he has a gradual return of his RUE weakness. He was also having significant RLE weakness and pain and underwent and L5-S1 anterior lumbar interbody fusion on 03/25/20. Patient reports that he does not have any back precautions but needs to wear the low back brace for one more month. Patient reports he has a follow-up appointment with the surgeon next month. Patient reports he has a follow-up appointment with Dr. Clelia Croft, neurologist, in August. He arrives to therapy ambulating with an upright rollator walker. Pt denies any mention of concussion after his injury. He has been having cognitive issues since his injury and has previously had a heavy metals screen as well as neurocognitive testing. He has had an MRI of his brain with and without contrast on 12/18/19. Results were mildly motion degraded examination. No evidence of acute intracranial abnormality. Minimal chronic small vessel ischemic disease. He has tremor in both his hands. He has a positive family history of Parkinson's disease in his grandfather. He is complaining of constant dizziness and unsteadiness which are worse with activity. Patient states that he frequently loses his balance and his wife has to steady him.    Limitations Lifting;Standing;Walking;House hold activities    Diagnostic tests He has had an MRI of his brain with and without contrast on 12/18/19. Results were mildly motion degraded examination. No evidence of acute intracranial abnormality. Minimal chronic small vessel ischemic disease.     Patient Stated Goals to be able to ambulate with a cane or no AD, to be able to drive, to be able to ride his motorcycle    Currently in Pain? Yes    Pain Score 8     Pain Location Leg    Pain Orientation Left;Right    Pain Descriptors / Indicators Sore    Pain Type Chronic pain    Pain Onset More than a month ago             TREATMENT   Ther-ex  [employer] L2  x 5 mins  Seated with 2# on R, 3# on L ankle weights Marches x 20 BLE, cued for going slower; LAQ x 20 reps, good motor control with obvious fatigue/fasciculation on right   Standing with 2# on R, 3# on L ankle weights: UE support used Hip abduction SLR x20 reps each; Hip extension/flexion SLR x20 reps each;  Heel raises x20 reps;  STS from chair with airex under feet x 10;   Manual Therapy Hamstring stretch x 30 secs each LE; ER/IR of hips x 30 secs each LE; Calf stretch x 30 secs each LE; Hip flexion x 30 secs each LE; Quad stretch x 30 secs each LE   Neuromuscular Re-education  Gait with hiking poles 2 x 100 feet, cued for reciprocal gait, gait looked normal. Student PT CGA.   Pt educated throughout session about proper posture and technique with exercises. Improved exercise technique, movement at target joints, use of target muscles after min to mod verbal, visual, tactile cues.    Patient demonstrates excellent motivation during session today. Stretched muscles to help with soreness in legs. He denies any increase in pain or fatigue during session today. During sit to stands, patient progressed to airex pad underneath feet, showing increased strength. Next session, increase ankle weights, as he is increasing in strength. Practiced walking with hiking poles and worked on reciprocal gait. Patient given new HEP for pool and encouraged to continue his HEP and follow-up as scheduled. Pt will benefit from PT services to address deficits in strength, balance, and mobility in order to return to full function at  home.               PT Short Term Goals - 06/27/20 0936      PT SHORT TERM GOAL #1   Title Patient will be independent in home exercise program to improve strength/mobility for better functional independence with ADLs and for self-management.    Baseline Pt reported that he is consistent with HEP, independent    Time 4    Period Weeks    Status Achieved    Target Date 06/18/20             PT Long Term Goals - 06/27/20 0937      PT LONG TERM GOAL #1   Title Patient will have increased score of 10 or more points on the FOTO survey in order to improve mobility and functional independence with ADLs.    Baseline scored 41/100 on 05/21/2020; 7/22 40    Time 8    Period Weeks    Status On-going    Target Date 07/16/20      PT LONG TERM GOAL #2   Title Patient will reduce falls risk as indicated by decreased TUG time to less than 20 seconds.    Baseline scored 37.9 sec with TUG on 05/21/2020; 21seconds with elevated rollator 7/22    Time 8    Period Weeks    Status New    Target Date 07/16/20      PT LONG TERM GOAL #3   Title Patient will increase right UE and LE gross strength to 4+/5 throughout to improve functional strength for independent gait, increased standing tolerance and increased ADL ability.    Baseline grossly +3/5 to -4/5 right UE and LE strength on 05/21/2020; grossly 4-/5 for RUE, grossly 4-/5 for RLE on 7/22    Time 8    Period Weeks    Status On-going    Target Date 07/16/20      PT LONG TERM GOAL #4   Title Patient will improve self-selected gait speed to 0.7 m/s or greater on 10 m walk to indicate reduced falls risk, improved mobility and independence with ADL's.    Baseline on 7/22 .57 m/sself selected, fast  .73 m/s with LOB pt reported R knee buckling, elevated rollator  used    Time 8    Period Weeks    Status New                 Plan - 07/04/20 0816    Clinical Impression Statement Patient demonstrates excellent motivation during  session today. Stretched muscles to help with soreness in legs. He denies any increase in pain or fatigue during session today. During sit to stands, patient progressed to airex pad underneath feet, showing increased strength. Next session, increase ankle weights, as he is increasing in strength. Practiced walking with hiking poles and worked on reciprocal gait. Patient given new HEP for pool and encouraged to continue his HEP and follow-up as scheduled. Pt will benefit from PT services to address deficits in strength, balance, and mobility in order to return to full function at home.    Personal Factors and Comorbidities Comorbidity 3+;Time since onset of injury/illness/exacerbation    Comorbidities anxiety, back pain, COPD, headache, HTN, MI, sleep apnea, migraines    Examination-Activity Limitations Locomotion Level;Transfers;Bed Mobility;Stand;Stairs;Sleep;Squat;Bend    Examination-Participation Restrictions Driving;Medication Management    Stability/Clinical Decision Making Unstable/Unpredictable    Rehab Potential Fair    PT Frequency 2x / week    PT Duration 8 weeks    PT Treatment/Interventions ADLs/Self Care Home Management;Canalith Repostioning;Moist Heat;Electrical Stimulation;Gait training;Stair training;Functional mobility training;Therapeutic activities;Therapeutic exercise;Balance training;Neuromuscular re-education;Patient/family education;Manual techniques;Passive range of motion;Vestibular    PT Next Visit Plan strength and balance exercises; seated head turning or visual tracking exercise per patient tolerance; increase ankle weights    PT Home Exercise Plan Medbridge Access Code: [email or web address] (eye tracking horizontal/vertical, seated clams, seated marches, standing mini squats, standing hip abduction (squats and abduction previously prescribed/performed by HHPT)    Consulted and Agree with Plan of Care Patient           Patient will benefit from skilled therapeutic intervention in  order to improve the following deficits and impairments:  Decreased activity tolerance, Decreased balance, Decreased cognition, Decreased mobility, Difficulty walking, Abnormal gait, Decreased range of motion, Dizziness, Pain, Impaired flexibility, Decreased strength, Impaired sensation  Visit Diagnosis: Unsteadiness on feet  Muscle weakness (generalized)  Dizziness and giddiness     Problem List Patient Active Problem List   Diagnosis Date Noted  . Degenerative spondylolisthesis 03/25/2020  . Physical deconditioning 03/04/2020  . Lobar pneumonia, unspecified organism (HCC) 03/04/2020  . Intractable hemiplegic migraine with status migrainosus 03/02/2020  . Current tobacco use 02/05/2020  . Abnormal findings on diagnostic imaging of lung 02/05/2020  . Shortness of breath 02/05/2020  . Herniated disc, cervical 01/23/2020  . Cervical spondylosis with myelopathy and radiculopathy 01/23/2020  . Pre-operative respiratory examination 01/12/2020  . Weakness on right side of face 12/22/2019  . Osteoarthritis of spine with radiculopathy, lumbar region 12/11/2019  . Intractable migraine with aura with status migrainosus 11/09/2019  . Former smoker 08/27/2018  . Benign essential HTN 08/27/2018  . Cough productive of purulent sputum 11/08/2017  . GERD (gastroesophageal reflux disease) 05/06/2017  . Contusion of hand 04/22/2017  . BP (high blood pressure) 04/22/2017  . Oxygen desaturation 04/22/2017  . Prostate lump 04/22/2017  . Unstable angina (HCC) 04/27/2016  . Chest pain 04/24/2016  . Morbid obesity (HCC) 01/29/2016  . Elevated ALT measurement 10/30/2015  . Nonspecific elevation of levels of transaminase and lactic acid dehydrogenase (ldh) 10/30/2015  . Reduced libido 10/29/2015  . Hyperlipidemia 08/06/2015  . Anxiety 08/06/2015  . Atherosclerosis of coronary artery 08/06/2015  . Childhood asthma 08/06/2015  . Chronic obstructive  pulmonary disease (HCC) 08/06/2015  . OSA  (obstructive sleep apnea) 08/06/2015  . Anxiety disorder 08/06/2015  . Atherosclerosis of native coronary artery of native heart with stable angina pectoris (HCC) 08/06/2015  . Uncomplicated asthma 08/06/2015    This entire session was performed under direct supervision and direction of a licensed therapist/therapist assistant . I have personally read, edited and approve of the note as written.   Katherine Basset, SPT Lynnea Maizes PT, DPT, GCS  Huprich,Jason 07/04/2020, 2:29 PM  [facility] MAIN [facility] SERVICES [address] [place], [place], [address] Phone: [phone]   Fax:  [phone]  Name: MAXI RODAS MRN: [id number] Date of Birth: 1971-04-29

## 2020-07-04 NOTE — Patient Instructions (Signed)
Access Code: [email or web address] URL: [email or web address] Date: 07/04/2020 Prepared by: Ria Comment  Exercises Forward Walking - 1 x daily - 3 x weekly - 2-3 lengths hold Backward Walking - 1 x daily - 3 x weekly - 2-3 lengths hold Side Stepping - 1 x daily - 3 x weekly - 2-3 lengths hold Squat - 1 x daily - 3 x weekly - 10 reps - 2 sets Standing Hip Abduction Adduction at Pool Wall - 1 x daily - 3 x weekly - 10 reps - 2 sets Standing Hip Flexion Extension at [employer] - 1 x daily - 3 x weekly - 10 reps - 2 sets Standing Knee Flexion - 1 x daily - 3 x weekly - 10 reps - 2 sets Standing Hip Circles at [employer] - 1 x daily - 3 x weekly - 10 reps - 2 sets

## 2020-07-05 ENCOUNTER — Ambulatory Visit: Payer: BC Managed Care – PPO

## 2020-07-09 ENCOUNTER — Encounter: Payer: Self-pay | Admitting: Physical Therapy

## 2020-07-09 ENCOUNTER — Ambulatory Visit: Payer: BC Managed Care – PPO | Attending: Neurology | Admitting: Physical Therapy

## 2020-07-09 ENCOUNTER — Other Ambulatory Visit: Payer: Self-pay

## 2020-07-09 DIAGNOSIS — R2681 Unsteadiness on feet: Secondary | ICD-10-CM | POA: Diagnosis not present

## 2020-07-09 DIAGNOSIS — R42 Dizziness and giddiness: Secondary | ICD-10-CM | POA: Diagnosis present

## 2020-07-09 DIAGNOSIS — M6281 Muscle weakness (generalized): Secondary | ICD-10-CM | POA: Diagnosis present

## 2020-07-09 NOTE — Therapy (Signed)
[facility] MAIN [facility] SERVICES [address] [place], [place], [address] Phone: [phone]   Fax:  [phone]  Physical Therapy Treatment  Patient Details  Name: Cory Espinoza MRN: [id number] Date of Birth: [birth year] Referring Provider (PT): Dr. Steele Sizer   Encounter Date: 07/09/2020   PT End of Session - 07/09/20 0956    Visit Number 13    Number of Visits 17    Date for PT Re-Evaluation 07/16/20    Authorization Type eval: 05/21/20    Authorization Time Period PT FOTO completed; FOTO performed 7/22 scored 40    PT Start Time 0950    PT Stop Time 1030    PT Time Calculation (min) 40 min    Equipment Utilized During Treatment Gait belt    Activity Tolerance Patient tolerated treatment well;No increased pain    Behavior During Therapy WFL for tasks assessed/performed           Past Medical History:  Diagnosis Date  . Allergy   . Anginal pain (HCC)   . Anxiety   . Arthritis    lumbar spine  . Asthma   . Atrial fibrillation (HCC)   . Back pain    Severe Lumbar Pain  . CHF (congestive heart failure) (HCC)   . COPD (chronic obstructive pulmonary disease) (HCC)    Restrictive lung disease  . Coronary artery disease   . Dyspnea   . Dysrhythmia    afib  . GERD (gastroesophageal reflux disease)   . Headache    [name], onset October 2020, cluster headaches in the past  . History of kidney stones   . Hyperlipidemia   . Hypertension   . Myocardial infarction (HCC)    at age 26  . Pneumonia   . Restrictive lung disease   . Sleep apnea    unable to use cpap since onset of migraines    Past Surgical History:  Procedure Laterality Date  . ABDOMINAL EXPOSURE N/A 03/25/2020   Procedure: ABDOMINAL EXPOSURE;  Surgeon: Larina Earthly, MD;  Location: [facility] OR;  Service: Vascular;  Laterality: N/A;  . ANTERIOR CERVICAL DECOMP/DISCECTOMY FUSION N/A 01/23/2020   Procedure: ANTERIOR CERVICAL DECOMPRESSION FUSION - CERVICAL  SIX-CERVICAL SEVEN;  Surgeon: Julio Sicks, MD;  Location: [facility] OR;  Service: Neurosurgery;  Laterality: N/A;  . ANTERIOR LUMBAR FUSION N/A 03/25/2020   Procedure: ANTERIOR LUMBAR INTERBODY FUSION LUMBAR FIVE-SACRAL ONE.;  Surgeon: Julio Sicks, MD;  Location: [facility] OR;  Service: Neurosurgery;  Laterality: N/A;  anterior  . BACK SURGERY  1996  . CARDIAC CATHETERIZATION Left 04/27/2016   Procedure: Left Heart Cath and Coronary Angiography;  Surgeon: Lamar Blinks, MD;  Location: [facility] INVASIVE CV LAB;  Service: Cardiovascular;  Laterality: Left;  . CARDIAC CATHETERIZATION N/A 04/27/2016   Procedure: Intravascular Pressure Wire/FFR Study;  Surgeon: Alwyn Pea, MD;  Location: [facility] INVASIVE CV LAB;  Service: Cardiovascular;  Laterality: N/A;  . CATHETERIZATION OF PULMONARY ARTERY WITH RETRIEVAL OF FOREIGN BODY Bilateral 04/07/2011   heart  . DEGENERATIVE SPONDYLOLISTHESIS    . KNEE ARTHROSCOPY Bilateral 2004  . LUMBAR DISC SURGERY  1999  . RADICULOPATHY, CERVICAL REGION    . TONSILLECTOMY      There were no vitals filed for this visit.   Subjective Assessment - 07/09/20 0954    Subjective Patient reports that he is not hurting any more than usual.    Pertinent History Pt has a complex medical history. 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 He states that he fell 10-12 feet and struck his head, neck, and back. He was able to finish the course which took him approximately 45 minutes more to complete. He does endorse a second fall while finishing the course but did not suffer a second head injury. He states that he started getting headaches that day which have persisted since that time. 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 He does report improvement in his headaches recently. Neurology was concerned about possible BPPV and have referred him for a vestibular evaluation. In addition  since the injury, pt developed RUE and RLE pain and weakness. Pt states that NCV showed abnormal nerve conduction in RUE. He has since undergone a C7-7 ACDF on 01/23/20. He reports initial improvement in RUE strength, but states that he has a gradual return of his RUE weakness. He was also having significant RLE weakness and pain and underwent and L5-S1 anterior lumbar interbody fusion on 03/25/20. Patient reports that he does not have any back precautions but needs to wear the low back brace for one more month. Patient reports he has a follow-up appointment with the surgeon next month. Patient reports he has a follow-up appointment with Dr. Clelia CroftShaw, neurologist, in August. He arrives to therapy ambulating with an upright rollator walker. Pt denies any mention of concussion after his injury. He has been having cognitive issues since his injury and has previously had a heavy metals screen as well as neurocognitive testing. He has had an MRI of his brain with and without contrast on 12/18/19. Results were mildly motion degraded examination. No evidence of acute intracranial abnormality. Minimal chronic small vessel ischemic disease. He has tremor in both his hands. He has a positive family history of Parkinson's disease in his grandfather. He is complaining of constant dizziness and unsteadiness which are worse with activity. Patient states that he frequently loses his balance and his wife has to steady him.    Limitations Lifting;Standing;Walking;House hold activities    Diagnostic tests He has had an MRI of his brain with and without contrast on 12/18/19. Results were mildly motion degraded examination. No evidence of acute intracranial abnormality. Minimal chronic small vessel ischemic disease.    Patient Stated Goals to be able to ambulate with a cane or no AD, to be able to drive, to be able to ride his motorcycle    Currently in Pain? Yes    Pain Score 8     Pain Location Leg    Pain Orientation Right;Left     Pain Descriptors / Indicators Aching    Pain Type Chronic pain    Pain Onset More than a month ago    Pain Frequency Constant    Aggravating Factors  insure    Pain Relieving Factors unsure    Effect of Pain on Daily Activities slow with activities    Multiple Pain Sites No               Neuromuscular Re-education  Lunge to BOSU ball x 10 BLE Step ups to 6 inch stool x 20 Lateral side steps from foam to 6 inch stool left and right x 15 Backwards stepping lunge x 15  Eccentric step downs from 6 inch stool to 3 inch stool x 5 reps x 4 sets Side stepping with loftstrand crutch x 100 feet left and right Gait training with  loft strand crutch 100 feet x 3  With CGA     Pt educated throughout session about proper posture and technique with exercises. Improved exercise technique, movement at target joints, use of target muscles after min to mod verbal, visual, tactile cues.                       PT Education - 07/09/20 0956    Education Details gait training    Person(s) Educated Patient    Methods Explanation    Comprehension Need further instruction;Verbalized understanding            PT Short Term Goals - 06/27/20 0936      PT SHORT TERM GOAL #1   Title Patient will be independent in home exercise program to improve strength/mobility for better functional independence with ADLs and for self-management.    Baseline Pt reported that he is consistent with HEP, independent    Time 4    Period Weeks    Status Achieved    Target Date 06/18/20             PT Long Term Goals - 06/27/20 0937      PT LONG TERM GOAL #1   Title Patient will have increased score of 10 or more points on the FOTO survey in order to improve mobility and functional independence with ADLs.    Baseline scored 41/100 on 05/21/2020; 7/22 40    Time 8    Period Weeks    Status On-going    Target Date 07/16/20      PT LONG TERM GOAL #2   Title Patient will reduce falls risk  as indicated by decreased TUG time to less than 20 seconds.    Baseline scored 37.9 sec with TUG on 05/21/2020; 21seconds with elevated rollator 7/22    Time 8    Period Weeks    Status New    Target Date 07/16/20      PT LONG TERM GOAL #3   Title Patient will increase right UE and LE gross strength to 4+/5 throughout to improve functional strength for independent gait, increased standing tolerance and increased ADL ability.    Baseline grossly +3/5 to -4/5 right UE and LE strength on 05/21/2020; grossly 4-/5 for RUE, grossly 4-/5 for RLE on 7/22    Time 8    Period Weeks    Status On-going    Target Date 07/16/20      PT LONG TERM GOAL #4   Title Patient will improve self-selected gait speed to 0.7 m/s or greater on 10 m walk to indicate reduced falls risk, improved mobility and independence with ADL's.    Baseline on 7/22 .57 m/sself selected, fast  .73 m/s with LOB pt reported R knee buckling, elevated rollator used    Time 8    Period Weeks    Status New                 Plan - 07/09/20 6644    Clinical Impression Statement Patient instructed in intermediate strengthening and balance exercise.  Patient requires min Vcs for correct exercise technique including to improve LE control with standing exercise.Patient demonstrates good technique with loftstrand crutch for gait.. Patient would benefit from additional skilled PT intervention to improve balance/gait safety and reduce fall risk.   Personal Factors and Comorbidities Comorbidity 3+;Time since onset of injury/illness/exacerbation    Comorbidities anxiety, back pain, COPD, headache, HTN, MI, sleep apnea, migraines  Examination-Activity Limitations Locomotion Level;Transfers;Bed Mobility;Stand;Stairs;Sleep;Squat;Bend    Examination-Participation Restrictions Driving;Medication Management    Stability/Clinical Decision Making Unstable/Unpredictable    Rehab Potential Fair    PT Frequency 2x / week    PT Duration 8 weeks     PT Treatment/Interventions ADLs/Self Care Home Management;Canalith Repostioning;Moist Heat;Electrical Stimulation;Gait training;Stair training;Functional mobility training;Therapeutic activities;Therapeutic exercise;Balance training;Neuromuscular re-education;Patient/family education;Manual techniques;Passive range of motion;Vestibular    PT Next Visit Plan strength and balance exercises; seated head turning or visual tracking exercise per patient tolerance; increase ankle weights    PT Home Exercise Plan Medbridge Access Code: [email or web address] (eye tracking horizontal/vertical, seated clams, seated marches, standing mini squats, standing hip abduction (squats and abduction previously prescribed/performed by HHPT)    Consulted and Agree with Plan of Care Patient           Patient will benefit from skilled therapeutic intervention in order to improve the following deficits and impairments:  Decreased activity tolerance, Decreased balance, Decreased cognition, Decreased mobility, Difficulty walking, Abnormal gait, Decreased range of motion, Dizziness, Pain, Impaired flexibility, Decreased strength, Impaired sensation  Visit Diagnosis: Unsteadiness on feet  Muscle weakness (generalized)  Dizziness and giddiness     Problem List Patient Active Problem List   Diagnosis Date Noted  . Degenerative spondylolisthesis 03/25/2020  . Physical deconditioning 03/04/2020  . Lobar pneumonia, unspecified organism (HCC) 03/04/2020  . Intractable hemiplegic migraine with status migrainosus 03/02/2020  . Current tobacco use 02/05/2020  . Abnormal findings on diagnostic imaging of lung 02/05/2020  . Shortness of breath 02/05/2020  . Herniated disc, cervical 01/23/2020  . Cervical spondylosis with myelopathy and radiculopathy 01/23/2020  . Pre-operative respiratory examination 01/12/2020  . Weakness on right side of face 12/22/2019  . Osteoarthritis of spine with radiculopathy, lumbar region 12/11/2019  .  Intractable migraine with aura with status migrainosus 11/09/2019  . Former smoker 08/27/2018  . Benign essential HTN 08/27/2018  . Cough productive of purulent sputum 11/08/2017  . GERD (gastroesophageal reflux disease) 05/06/2017  . Contusion of hand 04/22/2017  . BP (high blood pressure) 04/22/2017  . Oxygen desaturation 04/22/2017  . Prostate lump 04/22/2017  . Unstable angina (HCC) 04/27/2016  . Chest pain 04/24/2016  . Morbid obesity (HCC) 01/29/2016  . Elevated ALT measurement 10/30/2015  . Nonspecific elevation of levels of transaminase and lactic acid dehydrogenase (ldh) 10/30/2015  . Reduced libido 10/29/2015  . Hyperlipidemia 08/06/2015  . Anxiety 08/06/2015  . Atherosclerosis of coronary artery 08/06/2015  . Childhood asthma 08/06/2015  . Chronic obstructive pulmonary disease (HCC) 08/06/2015  . OSA (obstructive sleep apnea) 08/06/2015  . Anxiety disorder 08/06/2015  . Atherosclerosis of native coronary artery of native heart with stable angina pectoris (HCC) 08/06/2015  . Uncomplicated asthma 08/06/2015    Ezekiel Ina, PT DPT 07/09/2020, 3:19 PM  [facility] MAIN [facility] SERVICES [address] [place], [place], [address] Phone: [phone]   Fax:  [phone]  Name: Cory Espinoza MRN: [id number] Date of Birth: 11-22-1971

## 2020-07-16 ENCOUNTER — Other Ambulatory Visit: Payer: Self-pay

## 2020-07-16 ENCOUNTER — Ambulatory Visit: Payer: BC Managed Care – PPO | Admitting: Physical Therapy

## 2020-07-16 ENCOUNTER — Encounter: Payer: Self-pay | Admitting: Physical Therapy

## 2020-07-16 DIAGNOSIS — R2681 Unsteadiness on feet: Secondary | ICD-10-CM

## 2020-07-16 DIAGNOSIS — R42 Dizziness and giddiness: Secondary | ICD-10-CM

## 2020-07-16 DIAGNOSIS — M6281 Muscle weakness (generalized): Secondary | ICD-10-CM

## 2020-07-16 NOTE — Therapy (Signed)
[facility] MAIN [facility] SERVICES [address] [place], [place], [address] Phone: [phone]   Fax:  [phone]  Physical Therapy Treatment  Patient Details  Name: Cory Espinoza MRN: [id number] Date of Birth: 08-13-1971 Referring Provider (PT): Dr. Steele Sizer   Encounter Date: 07/16/2020   PT End of Session - 07/16/20 0929    Visit Number 14    Number of Visits 17    Date for PT Re-Evaluation 07/16/20    Authorization Type eval: 05/21/20    Authorization Time Period PT FOTO completed; FOTO performed 7/22 scored 40    PT Start Time 0903    PT Stop Time 0945    PT Time Calculation (min) 42 min    Equipment Utilized During Treatment Gait belt    Activity Tolerance Patient tolerated treatment well;No increased pain    Behavior During Therapy WFL for tasks assessed/performed           Past Medical History:  Diagnosis Date  . Allergy   . Anginal pain (HCC)   . Anxiety   . Arthritis    lumbar spine  . Asthma   . Atrial fibrillation (HCC)   . Back pain    Severe Lumbar Pain  . CHF (congestive heart failure) (HCC)   . COPD (chronic obstructive pulmonary disease) (HCC)    Restrictive lung disease  . Coronary artery disease   . Dyspnea   . Dysrhythmia    afib  . GERD (gastroesophageal reflux disease)   . Headache    [name], onset October 2020, cluster headaches in the past  . History of kidney stones   . Hyperlipidemia   . Hypertension   . Myocardial infarction (HCC)    at age 50  . Pneumonia   . Restrictive lung disease   . Sleep apnea    unable to use cpap since onset of migraines    Past Surgical History:  Procedure Laterality Date  . ABDOMINAL EXPOSURE N/A 03/25/2020   Procedure: ABDOMINAL EXPOSURE;  Surgeon: Larina Earthly, MD;  Location: [facility] OR;  Service: Vascular;  Laterality: N/A;  . ANTERIOR CERVICAL DECOMP/DISCECTOMY FUSION N/A 01/23/2020   Procedure: ANTERIOR CERVICAL DECOMPRESSION FUSION - CERVICAL  SIX-CERVICAL SEVEN;  Surgeon: Julio Sicks, MD;  Location: [facility] OR;  Service: Neurosurgery;  Laterality: N/A;  . ANTERIOR LUMBAR FUSION N/A 03/25/2020   Procedure: ANTERIOR LUMBAR INTERBODY FUSION LUMBAR FIVE-SACRAL ONE.;  Surgeon: Julio Sicks, MD;  Location: [facility] OR;  Service: Neurosurgery;  Laterality: N/A;  anterior  . BACK SURGERY  1996  . CARDIAC CATHETERIZATION Left 04/27/2016   Procedure: Left Heart Cath and Coronary Angiography;  Surgeon: Lamar Blinks, MD;  Location: [facility] INVASIVE CV LAB;  Service: Cardiovascular;  Laterality: Left;  . CARDIAC CATHETERIZATION N/A 04/27/2016   Procedure: Intravascular Pressure Wire/FFR Study;  Surgeon: Alwyn Pea, MD;  Location: [facility] INVASIVE CV LAB;  Service: Cardiovascular;  Laterality: N/A;  . CATHETERIZATION OF PULMONARY ARTERY WITH RETRIEVAL OF FOREIGN BODY Bilateral 04/07/2011   heart  . DEGENERATIVE SPONDYLOLISTHESIS    . KNEE ARTHROSCOPY Bilateral 2004  . LUMBAR DISC SURGERY  1999  . RADICULOPATHY, CERVICAL REGION    . TONSILLECTOMY      There were no vitals filed for this visit.   Subjective Assessment - 07/16/20 0912    Subjective Patient reports that he is not hurting any more than usual.His legs hurt and he has a migraine.    Pertinent History Pt has a complex medical  history. 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 He states that he fell 10-12 feet and struck his head, neck, and back. He was able to finish the course which took him approximately 45 minutes more to complete. He does endorse a second fall while finishing the course but did not suffer a second head injury. He states that he started getting headaches that day which have persisted since that time. 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 He does report improvement in his headaches recently. Neurology was concerned about possible BPPV and have referred him for a  vestibular evaluation. In addition since the injury, pt developed RUE and RLE pain and weakness. Pt states that NCV showed abnormal nerve conduction in RUE. He has since undergone a C7-7 ACDF on 01/23/20. He reports initial improvement in RUE strength, but states that he has a gradual return of his RUE weakness. He was also having significant RLE weakness and pain and underwent and L5-S1 anterior lumbar interbody fusion on 03/25/20. Patient reports that he does not have any back precautions but needs to wear the low back brace for one more month. Patient reports he has a follow-up appointment with the surgeon next month. Patient reports he has a follow-up appointment with Dr. Clelia CroftShaw, neurologist, in August. He arrives to therapy ambulating with an upright rollator walker. Pt denies any mention of concussion after his injury. He has been having cognitive issues since his injury and has previously had a heavy metals screen as well as neurocognitive testing. He has had an MRI of his brain with and without contrast on 12/18/19. Results were mildly motion degraded examination. No evidence of acute intracranial abnormality. Minimal chronic small vessel ischemic disease. He has tremor in both his hands. He has a positive family history of Parkinson's disease in his grandfather. He is complaining of constant dizziness and unsteadiness which are worse with activity. Patient states that he frequently loses his balance and his wife has to steady him.    Limitations Lifting;Standing;Walking;House hold activities    Diagnostic tests He has had an MRI of his brain with and without contrast on 12/18/19. Results were mildly motion degraded examination. No evidence of acute intracranial abnormality. Minimal chronic small vessel ischemic disease.    Patient Stated Goals to be able to ambulate with a cane or no AD, to be able to drive, to be able to ride his motorcycle    Currently in Pain? Yes    Pain Score 10-Worst pain ever    Pain  Location Leg    Pain Orientation Right;Left    Pain Descriptors / Indicators Aching    Pain Type Chronic pain    Pain Onset More than a month ago             Ther-ex  Nu-step  x 5 mins  Hip flexion marches with 2# ankle weights x 10 bilateral Hip abduction with 2# x 10 bilateral Hip extension with 2# x 10 bilateral Quantum leg press 25# x 20 x 3 sets  Squats x 15 with cues for correct posture Lunges to BOSU ball x 15 BLE Heel raises x 15 x 2 sets Step ups to 6-inch stool x 20  Eccentric step downs from 3 inch stool x 10 verbal cues to complete slow and tap heel.  BUE CGA Rocker board fwd/ bwd, side to side x 20  Gait training with loftstrand crutches ascending / descending steps x 4  Gait training with loftstrand crutches 1000 feet with SBA   Patient needs occasional verbal cueing to improve posture and cueing to correctly perform exercises slowly, holding at end of range to increase motor firing of desired muscle to encourage fatigue.                          PT Education - 07/16/20 0913    Education Details gait training with loftstrand crutch    Methods Explanation    Comprehension Verbalized understanding            PT Short Term Goals - 06/27/20 0936      PT SHORT TERM GOAL #1   Title Patient will be independent in home exercise program to improve strength/mobility for better functional independence with ADLs and for self-management.    Baseline Pt reported that he is consistent with HEP, independent    Time 4    Period Weeks    Status Achieved    Target Date 06/18/20             PT Long Term Goals - 06/27/20 0937      PT LONG TERM GOAL #1   Title Patient will have increased score of 10 or more points on the FOTO survey in order to improve mobility and functional independence with ADLs.    Baseline scored 41/100 on 05/21/2020; 7/22 40    Time 8    Period Weeks    Status On-going    Target Date 07/16/20      PT LONG TERM GOAL #2    Title Patient will reduce falls risk as indicated by decreased TUG time to less than 20 seconds.    Baseline scored 37.9 sec with TUG on 05/21/2020; 21seconds with elevated rollator 7/22    Time 8    Period Weeks    Status New    Target Date 07/16/20      PT LONG TERM GOAL #3   Title Patient will increase right UE and LE gross strength to 4+/5 throughout to improve functional strength for independent gait, increased standing tolerance and increased ADL ability.    Baseline grossly +3/5 to -4/5 right UE and LE strength on 05/21/2020; grossly 4-/5 for RUE, grossly 4-/5 for RLE on 7/22    Time 8    Period Weeks    Status On-going    Target Date 07/16/20      PT LONG TERM GOAL #4   Title Patient will improve self-selected gait speed to 0.7 m/s or greater on 10 m walk to indicate reduced falls risk, improved mobility and independence with ADL's.    Baseline on 7/22 .57 m/sself selected, fast  .73 m/s with LOB pt reported R knee buckling, elevated rollator used    Time 8    Period Weeks    Status New                 Plan - 07/16/20 2951    Clinical Impression Statement Patient instructed in intermediate strengthening and balance exercise.  Patient requires min Vcs for correct exercise technique including to improve LE control with standing exercise. Patient demonstrates better quad control with SLS tasks with rail assist. Patient would benefit from additional skilled PT intervention to improve balance/gait safety and reduce fall risk.   Personal Factors and Comorbidities Comorbidity 3+;Time since onset of injury/illness/exacerbation    Comorbidities anxiety,  back pain, COPD, headache, HTN, MI, sleep apnea, migraines    Examination-Activity Limitations Locomotion Level;Transfers;Bed Mobility;Stand;Stairs;Sleep;Squat;Bend    Examination-Participation Restrictions Driving;Medication Management    Stability/Clinical Decision Making Unstable/Unpredictable    Rehab Potential Fair    PT  Frequency 2x / week    PT Duration 8 weeks    PT Treatment/Interventions ADLs/Self Care Home Management;Canalith Repostioning;Moist Heat;Electrical Stimulation;Gait training;Stair training;Functional mobility training;Therapeutic activities;Therapeutic exercise;Balance training;Neuromuscular re-education;Patient/family education;Manual techniques;Passive range of motion;Vestibular    PT Next Visit Plan strength and balance exercises; seated head turning or visual tracking exercise per patient tolerance; increase ankle weights    PT Home Exercise Plan Medbridge Access Code: [email or web address] (eye tracking horizontal/vertical, seated clams, seated marches, standing mini squats, standing hip abduction (squats and abduction previously prescribed/performed by HHPT)    Consulted and Agree with Plan of Care Patient           Patient will benefit from skilled therapeutic intervention in order to improve the following deficits and impairments:  Decreased activity tolerance, Decreased balance, Decreased cognition, Decreased mobility, Difficulty walking, Abnormal gait, Decreased range of motion, Dizziness, Pain, Impaired flexibility, Decreased strength, Impaired sensation  Visit Diagnosis: Unsteadiness on feet  Muscle weakness (generalized)  Dizziness and giddiness     Problem List Patient Active Problem List   Diagnosis Date Noted  . Degenerative spondylolisthesis 03/25/2020  . Physical deconditioning 03/04/2020  . Lobar pneumonia, unspecified organism (HCC) 03/04/2020  . Intractable hemiplegic migraine with status migrainosus 03/02/2020  . Current tobacco use 02/05/2020  . Abnormal findings on diagnostic imaging of lung 02/05/2020  . Shortness of breath 02/05/2020  . Herniated disc, cervical 01/23/2020  . Cervical spondylosis with myelopathy and radiculopathy 01/23/2020  . Pre-operative respiratory examination 01/12/2020  . Weakness on right side of face 12/22/2019  . Osteoarthritis of spine with  radiculopathy, lumbar region 12/11/2019  . Intractable migraine with aura with status migrainosus 11/09/2019  . Former smoker 08/27/2018  . Benign essential HTN 08/27/2018  . Cough productive of purulent sputum 11/08/2017  . GERD (gastroesophageal reflux disease) 05/06/2017  . Contusion of hand 04/22/2017  . BP (high blood pressure) 04/22/2017  . Oxygen desaturation 04/22/2017  . Prostate lump 04/22/2017  . Unstable angina (HCC) 04/27/2016  . Chest pain 04/24/2016  . Morbid obesity (HCC) 01/29/2016  . Elevated ALT measurement 10/30/2015  . Nonspecific elevation of levels of transaminase and lactic acid dehydrogenase (ldh) 10/30/2015  . Reduced libido 10/29/2015  . Hyperlipidemia 08/06/2015  . Anxiety 08/06/2015  . Atherosclerosis of coronary artery 08/06/2015  . Childhood asthma 08/06/2015  . Chronic obstructive pulmonary disease (HCC) 08/06/2015  . OSA (obstructive sleep apnea) 08/06/2015  . Anxiety disorder 08/06/2015  . Atherosclerosis of native coronary artery of native heart with stable angina pectoris (HCC) 08/06/2015  . Uncomplicated asthma 08/06/2015    Ezekiel Ina, PT DPT 07/16/2020, 9:30 AM  [facility] MAIN [facility] SERVICES [address] [place], [place], [address] Phone: [phone]   Fax:  [phone]  Name: Cory Espinoza MRN: [id number] Date of Birth: 1971/01/03

## 2020-07-18 ENCOUNTER — Ambulatory Visit: Payer: BC Managed Care – PPO | Admitting: Physical Therapy

## 2020-07-23 ENCOUNTER — Ambulatory Visit: Payer: BC Managed Care – PPO | Admitting: Physical Therapy

## 2020-07-25 ENCOUNTER — Ambulatory Visit: Payer: BC Managed Care – PPO | Admitting: Physical Therapy

## 2020-07-29 ENCOUNTER — Encounter: Payer: Self-pay | Admitting: Physical Therapy

## 2020-07-29 ENCOUNTER — Other Ambulatory Visit: Payer: Self-pay

## 2020-07-29 ENCOUNTER — Ambulatory Visit: Payer: BC Managed Care – PPO | Admitting: Physical Therapy

## 2020-07-29 DIAGNOSIS — M6281 Muscle weakness (generalized): Secondary | ICD-10-CM

## 2020-07-29 DIAGNOSIS — R42 Dizziness and giddiness: Secondary | ICD-10-CM

## 2020-07-29 DIAGNOSIS — R2681 Unsteadiness on feet: Secondary | ICD-10-CM

## 2020-07-29 NOTE — Patient Instructions (Signed)
Access Code: [email or web address] URL: [email or web address] Date: 07/29/2020 Prepared by: Zettie Pho  Exercises Standing Single Leg Stance with Counter Support - 1 x daily - 5 x weekly - 1 sets - 5 reps - 5-10 sec hold Toe Walking with Counter Support - 1 x daily - 5 x weekly - 1 sets - 2-3 reps Heel Walking with Counter Support - 1 x daily - 5 x weekly - 1 sets - 2-3 reps Backward Walking with Counter Support - 1 x daily - 7 x weekly - 1 sets - 2-3 reps
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
## 2020-08-01 ENCOUNTER — Ambulatory Visit: Payer: BC Managed Care – PPO

## 2020-08-05 ENCOUNTER — Other Ambulatory Visit: Payer: Self-pay

## 2020-08-05 ENCOUNTER — Ambulatory Visit: Payer: BC Managed Care – PPO

## 2020-08-05 DIAGNOSIS — R2681 Unsteadiness on feet: Secondary | ICD-10-CM

## 2020-08-05 DIAGNOSIS — R42 Dizziness and giddiness: Secondary | ICD-10-CM

## 2020-08-05 DIAGNOSIS — M6281 Muscle weakness (generalized): Secondary | ICD-10-CM

## 2020-08-05 NOTE — Therapy (Signed)
[facility] MAIN [facility] SERVICES [address] [place], [place], [address] Phone: [phone]   Fax:  [phone]  Physical Therapy Treatment  Patient Details  Name: Cory Espinoza MRN: [id number] Date of Birth: 1971-10-29 Referring Provider (PT): Dr. Steele Sizer   Encounter Date: 08/05/2020    Past Medical History:  Diagnosis Date  . Allergy   . Anginal pain (HCC)   . Anxiety   . Arthritis    lumbar spine  . Asthma   . Atrial fibrillation (HCC)   . Back pain    Severe Lumbar Pain  . CHF (congestive heart failure) (HCC)   . COPD (chronic obstructive pulmonary disease) (HCC)    Restrictive lung disease  . Coronary artery disease   . Dyspnea   . Dysrhythmia    afib  . GERD (gastroesophageal reflux disease)   . Headache    [name], onset October 2020, cluster headaches in the past  . History of kidney stones   . Hyperlipidemia   . Hypertension   . Myocardial infarction (HCC)    at age 12  . Pneumonia   . Restrictive lung disease   . Sleep apnea    unable to use cpap since onset of migraines    Past Surgical History:  Procedure Laterality Date  . ABDOMINAL EXPOSURE N/A 03/25/2020   Procedure: ABDOMINAL EXPOSURE;  Surgeon: Larina Earthly, MD;  Location: [facility] OR;  Service: Vascular;  Laterality: N/A;  . ANTERIOR CERVICAL DECOMP/DISCECTOMY FUSION N/A 01/23/2020   Procedure: ANTERIOR CERVICAL DECOMPRESSION FUSION - CERVICAL SIX-CERVICAL SEVEN;  Surgeon: Julio Sicks, MD;  Location: [facility] OR;  Service: Neurosurgery;  Laterality: N/A;  . ANTERIOR LUMBAR FUSION N/A 03/25/2020   Procedure: ANTERIOR LUMBAR INTERBODY FUSION LUMBAR FIVE-SACRAL ONE.;  Surgeon: Julio Sicks, MD;  Location: [facility] OR;  Service: Neurosurgery;  Laterality: N/A;  anterior  . BACK SURGERY  1996  . CARDIAC CATHETERIZATION Left 04/27/2016   Procedure: Left Heart Cath and Coronary Angiography;  Surgeon: Lamar Blinks, MD;  Location: [facility] INVASIVE CV LAB;  Service:  Cardiovascular;  Laterality: Left;  . CARDIAC CATHETERIZATION N/A 04/27/2016   Procedure: Intravascular Pressure Wire/FFR Study;  Surgeon: Alwyn Pea, MD;  Location: [facility] INVASIVE CV LAB;  Service: Cardiovascular;  Laterality: N/A;  . CATHETERIZATION OF PULMONARY ARTERY WITH RETRIEVAL OF FOREIGN BODY Bilateral 04/07/2011   heart  . DEGENERATIVE SPONDYLOLISTHESIS    . KNEE ARTHROSCOPY Bilateral 2004  . LUMBAR DISC SURGERY  1999  . RADICULOPATHY, CERVICAL REGION    . TONSILLECTOMY      There were no vitals filed for this visit.   Subjective Assessment - 08/05/20 1507    Subjective Pt reported that he forgot his ankle brace today, and that today has been a good day. no complaints of pain "besides the usual"    Pertinent History Pt has a complex medical history. 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 He states that he fell 10-12 feet and struck his head, neck, and back. He was able to finish the course which took him approximately 45 minutes more to complete. He does endorse a second fall while finishing the course but did not suffer a second head injury. He states that he started getting headaches that day which have persisted since that time. 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 He does report improvement in  his headaches recently. Neurology was concerned about possible BPPV and have referred him for a vestibular evaluation. In addition since the injury, pt developed RUE and RLE pain and weakness. Pt states that NCV showed abnormal nerve conduction in RUE. He has since undergone a C7-7 ACDF on 01/23/20. He reports initial improvement in RUE strength, but states that he has a gradual return of his RUE weakness. He was also having significant RLE weakness and pain and underwent and L5-S1 anterior lumbar interbody fusion on 03/25/20. Patient reports that he does not have any back  precautions but needs to wear the low back brace for one more month. Patient reports he has a follow-up appointment with the surgeon next month. Patient reports he has a follow-up appointment with Dr. Clelia CroftShaw, neurologist, in August. He arrives to therapy ambulating with an upright rollator walker. Pt denies any mention of concussion after his injury. He has been having cognitive issues since his injury and has previously had a heavy metals screen as well as neurocognitive testing. He has had an MRI of his brain with and without contrast on 12/18/19. Results were mildly motion degraded examination. No evidence of acute intracranial abnormality. Minimal chronic small vessel ischemic disease. He has tremor in both his hands. He has a positive family history of Parkinson's disease in his grandfather. He is complaining of constant dizziness and unsteadiness which are worse with activity. Patient states that he frequently loses his balance and his wife has to steady him.    Limitations Lifting;Standing;Walking;House hold activities    Diagnostic tests He has had an MRI of his brain with and without contrast on 12/18/19. Results were mildly motion degraded examination. No evidence of acute intracranial abnormality. Minimal chronic small vessel ischemic disease.    Patient Stated Goals to be able to ambulate with a cane or no AD, to be able to drive, to be able to ride his motorcycle    Currently in Pain? Yes    Pain Score --   pt did not quantify          TREATMENT:  Exercises:  Standing Heel Raises fingertip support 10 reps - 5 hold Standing Romberg (modified) unilateral UE support 3 reps - 20 hold Mini Squat with Counter Support -10 reps with bilateral UE support Lunge with Counter Support x5 bilaterally, bilateral UE support Single Leg Stance -3 reps - 15 hold with unilateral or bilateral fingertip support   6MWT with walking stick: 87645ft  pre vitals : 113/69, HR 85, oxygen 99%  post vitals: 138/67  HR 85, 100%   pt response/clinical impression: The patient demonstrated fatigue after 6MWT, as well as RLE instability which increased with fatigue. Remainder of session focused on creating and demonstrated HEP, tactile and verbal cues throughout. UE support as needed to improve pt safety at home, pt and wife verbalized understanding, HEP administered. The patient would benefit from further skilled PT intervention to continue to progress towards goals.       PT Education - 08/05/20 1508    Education Details HEP, exercise technique/form    Person(s) Educated Patient    Methods Explanation;Verbal cues    Comprehension Verbalized understanding;Returned demonstration;Verbal cues required;Need further instruction;Tactile cues required            PT Short Term Goals - 06/27/20 0936      PT SHORT TERM GOAL #1   Title Patient will be independent in home exercise program to improve strength/mobility for better functional independence with ADLs and for self-management.  Baseline Pt reported that he is consistent with HEP, independent    Time 4    Period Weeks    Status Achieved    Target Date 06/18/20             PT Long Term Goals - 08/05/20 1329      PT LONG TERM GOAL #1   Title Patient will have increased score of 10 or more points on the FOTO survey in order to improve mobility and functional independence with ADLs.    Baseline scored 41/100 on 05/21/2020; 7/22 40, 8/23: 60/100    Time 8    Period Weeks    Status Achieved      PT LONG TERM GOAL #2   Title Patient will reduce falls risk as indicated by decreased TUG time to less than 20 seconds.    Baseline scored 37.9 sec with TUG on 05/21/2020; 21seconds with elevated rollator 7/22, 8/23: 13.62 sec with up and go walker    Time 8    Period Weeks    Status Achieved      PT LONG TERM GOAL #3   Title Patient will increase right UE and LE gross strength to 4+/5 throughout to improve functional strength for independent gait,  increased standing tolerance and increased ADL ability.    Baseline grossly +3/5 to -4/5 right UE and LE strength on 05/21/2020; grossly 4-/5 for RUE, grossly 4-/5 for RLE on 7/22    Time 8    Period Weeks    Status On-going      PT LONG TERM GOAL #4   Title Patient will improve self-selected gait speed to 0.7 m/s or greater on 10 m walk to indicate reduced falls risk, improved mobility and independence with ADL's.    Baseline on 7/22 .57 m/sself selected, fast  .73 m/s with LOB pt reported R knee buckling, elevated rollator used, 8/23: 1.06 m/s with up and go walker    Time 8    Period Weeks    Status Achieved      PT LONG TERM GOAL #5   Title Patient will increase Berg Balance score by > 6 points to demonstrate decreased fall risk during functional activities.    Baseline 8/23: 37/56    Time 8    Period Weeks    Status New      PT LONG TERM GOAL #6   Title Patient (< 11 years old) will complete five times sit to stand test in < 10 seconds indicating an increased LE strength and improved balance.    Baseline 8/23: 22 sec    Time 8    Period Weeks    Status New      PT LONG TERM GOAL #7   Title Patient will improve 6 min walk test >2000 feet with LRAD for improved gait ability in community.    Baseline 8/23: not tested; 8/30 845, goal adjusted >2056ft    Time 8    Period Weeks    Status New                 Plan - 08/05/20 1509    Clinical Impression Statement The patient demonstrated fatigue after , as well as RLE instability which increased with fatigue. Remainder of session focused on creating and demonstrated HEP, tactile and verbal cues throughout. UE support as needed to improve pt safety at home, pt and wife verbalized understanding, HEP administered. The patient would benefit from further skilled PT intervention to continue to  progress towards goals.    Personal Factors and Comorbidities Comorbidity 3+;Time since onset of injury/illness/exacerbation     Comorbidities anxiety, back pain, COPD, headache, HTN, MI, sleep apnea, migraines    Examination-Activity Limitations Locomotion Level;Transfers;Bed Mobility;Stand;Stairs;Sleep;Squat;Bend    Examination-Participation Restrictions Driving;Medication Management    Stability/Clinical Decision Making Unstable/Unpredictable    Rehab Potential Fair    PT Frequency 1x / week    PT Duration 8 weeks    PT Treatment/Interventions ADLs/Self Care Home Management;Canalith Repostioning;Moist Heat;Electrical Stimulation;Gait training;Stair training;Functional mobility training;Therapeutic activities;Therapeutic exercise;Balance training;Neuromuscular re-education;Patient/family education;Manual techniques;Passive range of motion;Vestibular    PT Next Visit Plan update HEP ea session for continued strengthening/balanceing at home    PT Home Exercise Plan Medbridge Access Code: [email or web address] updated this session    Consulted and Agree with Plan of Care Patient           Patient will benefit from skilled therapeutic intervention in order to improve the following deficits and impairments:  Decreased activity tolerance, Decreased balance, Decreased cognition, Decreased mobility, Difficulty walking, Abnormal gait, Decreased range of motion, Dizziness, Pain, Impaired flexibility, Decreased strength, Impaired sensation  Visit Diagnosis: Unsteadiness on feet  Muscle weakness (generalized)  Dizziness and giddiness     Problem List Patient Active Problem List   Diagnosis Date Noted  . Degenerative spondylolisthesis 03/25/2020  . Physical deconditioning 03/04/2020  . Lobar pneumonia, unspecified organism (HCC) 03/04/2020  . Intractable hemiplegic migraine with status migrainosus 03/02/2020  . Current tobacco use 02/05/2020  . Abnormal findings on diagnostic imaging of lung 02/05/2020  . Shortness of breath 02/05/2020  . Herniated disc, cervical 01/23/2020  . Cervical spondylosis with myelopathy and  radiculopathy 01/23/2020  . Pre-operative respiratory examination 01/12/2020  . Weakness on right side of face 12/22/2019  . Osteoarthritis of spine with radiculopathy, lumbar region 12/11/2019  . Intractable migraine with aura with status migrainosus 11/09/2019  . Former smoker 08/27/2018  . Benign essential HTN 08/27/2018  . Cough productive of purulent sputum 11/08/2017  . GERD (gastroesophageal reflux disease) 05/06/2017  . Contusion of hand 04/22/2017  . BP (high blood pressure) 04/22/2017  . Oxygen desaturation 04/22/2017  . Prostate lump 04/22/2017  . Unstable angina (HCC) 04/27/2016  . Chest pain 04/24/2016  . Morbid obesity (HCC) 01/29/2016  . Elevated ALT measurement 10/30/2015  . Nonspecific elevation of levels of transaminase and lactic acid dehydrogenase (ldh) 10/30/2015  . Reduced libido 10/29/2015  . Hyperlipidemia 08/06/2015  . Anxiety 08/06/2015  . Atherosclerosis of coronary artery 08/06/2015  . Childhood asthma 08/06/2015  . Chronic obstructive pulmonary disease (HCC) 08/06/2015  . OSA (obstructive sleep apnea) 08/06/2015  . Anxiety disorder 08/06/2015  . Atherosclerosis of native coronary artery of native heart with stable angina pectoris (HCC) 08/06/2015  . Uncomplicated asthma 08/06/2015    Olga Coaster PT, DPT 3:14 PM,08/05/20   [facility] MAIN [facility] SERVICES [address] [place], [place], [address] Phone: [phone]   Fax:  [phone]  Name: Cory Espinoza MRN: [id number] Date of Birth: 1971-06-19

## 2020-08-07 ENCOUNTER — Ambulatory Visit: Payer: BC Managed Care – PPO | Admitting: Physical Therapy

## 2020-08-15 ENCOUNTER — Ambulatory Visit: Payer: BC Managed Care – PPO | Admitting: Physical Therapy

## 2020-08-19 ENCOUNTER — Other Ambulatory Visit: Payer: Self-pay

## 2020-08-19 ENCOUNTER — Ambulatory Visit: Payer: BC Managed Care – PPO | Attending: Neurology

## 2020-08-19 DIAGNOSIS — R2681 Unsteadiness on feet: Secondary | ICD-10-CM | POA: Diagnosis present

## 2020-08-19 DIAGNOSIS — M6281 Muscle weakness (generalized): Secondary | ICD-10-CM | POA: Insufficient documentation

## 2020-08-19 NOTE — Therapy (Signed)
[place] [facility] MAIN [facility] SERVICES [address] [place], [place], [address] Phone: [phone]   Fax:  [phone]  Physical Therapy Treatment  Patient Details  Name: Cory Espinoza MRN: [id number] Date of Birth: 1971-10-18 Referring Provider (PT): Dr. Steele Sizer   Encounter Date: 08/19/2020   PT End of Session - 08/19/20 1407    Visit Number 17    Number of Visits 32    Date for PT Re-Evaluation 09/23/20    Authorization Type eval: 05/21/20    Authorization Time Period PT FOTO completed; FOTO performed 7/22 scored 40    PT Start Time 1347    PT Stop Time 1432    PT Time Calculation (min) 45 min    Equipment Utilized During Treatment Gait belt    Activity Tolerance Patient tolerated treatment well;No increased pain    Behavior During Therapy WFL for tasks assessed/performed           Past Medical History:  Diagnosis Date  . Allergy   . Anginal pain (HCC)   . Anxiety   . Arthritis    lumbar spine  . Asthma   . Atrial fibrillation (HCC)   . Back pain    Severe Lumbar Pain  . CHF (congestive heart failure) (HCC)   . COPD (chronic obstructive pulmonary disease) (HCC)    Restrictive lung disease  . Coronary artery disease   . Dyspnea   . Dysrhythmia    afib  . GERD (gastroesophageal reflux disease)   . Headache    [name], onset October 2020, cluster headaches in the past  . History of kidney stones   . Hyperlipidemia   . Hypertension   . Myocardial infarction (HCC)    at age 16  . Pneumonia   . Restrictive lung disease   . Sleep apnea    unable to use cpap since onset of migraines    Past Surgical History:  Procedure Laterality Date  . ABDOMINAL EXPOSURE N/A 03/25/2020   Procedure: ABDOMINAL EXPOSURE;  Surgeon: Larina Earthly, MD;  Location: [facility] OR;  Service: Vascular;  Laterality: N/A;  . ANTERIOR CERVICAL DECOMP/DISCECTOMY FUSION N/A 01/23/2020   Procedure: ANTERIOR CERVICAL DECOMPRESSION FUSION - CERVICAL  SIX-CERVICAL SEVEN;  Surgeon: Julio Sicks, MD;  Location: [facility] OR;  Service: Neurosurgery;  Laterality: N/A;  . ANTERIOR LUMBAR FUSION N/A 03/25/2020   Procedure: ANTERIOR LUMBAR INTERBODY FUSION LUMBAR FIVE-SACRAL ONE.;  Surgeon: Julio Sicks, MD;  Location: [facility] OR;  Service: Neurosurgery;  Laterality: N/A;  anterior  . BACK SURGERY  1996  . CARDIAC CATHETERIZATION Left 04/27/2016   Procedure: Left Heart Cath and Coronary Angiography;  Surgeon: Lamar Blinks, MD;  Location: [facility] INVASIVE CV LAB;  Service: Cardiovascular;  Laterality: Left;  . CARDIAC CATHETERIZATION N/A 04/27/2016   Procedure: Intravascular Pressure Wire/FFR Study;  Surgeon: Alwyn Pea, MD;  Location: [facility] INVASIVE CV LAB;  Service: Cardiovascular;  Laterality: N/A;  . CATHETERIZATION OF PULMONARY ARTERY WITH RETRIEVAL OF FOREIGN BODY Bilateral 04/07/2011   heart  . DEGENERATIVE SPONDYLOLISTHESIS    . KNEE ARTHROSCOPY Bilateral 2004  . LUMBAR DISC SURGERY  1999  . RADICULOPATHY, CERVICAL REGION    . TONSILLECTOMY      There were no vitals filed for this visit.   Subjective Assessment - 08/19/20 1355    Subjective Reports he is doing well today.  Continues complain of the same bilateral lower extremity pain which he rates an 8 out of 10. He states that he  stumbled a lot this weekend. He was very busy this weekend. Pt reports multiple falls since his last therapy appointment but no injuries reported. No specific question concerns time.    Pertinent History Pt has a complex medical history. 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 He states that he fell 10-12 feet and struck his head, neck, and back. He was able to finish the course which took him approximately 45 minutes more to complete. He does endorse a second fall while finishing the course but did not suffer a second head injury. He states that he started getting headaches that day which have persisted since that time. He is  currently under the care of neurology who is treating him for hemiplegic migraines and R occipital neuralgia. He does report improvement in his headaches recently. Neurology was concerned about possible BPPV and have referred him for a vestibular evaluation. In addition since the injury, pt developed RUE and RLE pain and weakness. Pt states that NCV showed abnormal nerve conduction in RUE. He has since undergone a C7-7 ACDF on 01/23/20. He reports initial improvement in RUE strength, but states that he has a gradual return of his RUE weakness. He was also having significant RLE weakness and pain and underwent and L5-S1 anterior lumbar interbody fusion on 03/25/20. Patient reports that he does not have any back precautions but needs to wear the low back brace for one more month. Patient reports he has a follow-up appointment with the surgeon next month. Patient reports he has a follow-up appointment with Dr. Clelia Croft, neurologist, in August. He arrives to therapy ambulating with an upright rollator walker. Pt denies any mention of concussion after his injury. He has been having cognitive issues since his injury and has previously had a heavy metals screen as well as neurocognitive testing. He has had an MRI of his brain with and without contrast on 12/18/19. Results were mildly motion degraded examination. No evidence of acute intracranial abnormality. Minimal chronic small vessel ischemic disease. He has tremor in both his hands. He has a positive family history of Parkinson's disease in his grandfather. He is complaining of constant dizziness and unsteadiness which are worse with activity. Patient states that he frequently loses his balance and his wife has to steady him.    Limitations Lifting;Standing;Walking;House hold activities    Diagnostic tests He has had an MRI of his brain with and without contrast on 12/18/19. Results were mildly motion degraded examination. No evidence of acute intracranial abnormality.  Minimal chronic small vessel ischemic disease.    Patient Stated Goals to be able to ambulate with a cane or no AD, to be able to drive, to be able to ride his motorcycle    Currently in Pain? Yes    Pain Score 8     Pain Location Back    Pain Orientation Right;Left    Pain Descriptors / Indicators Aching    Pain Type Chronic pain    Pain Onset More than a month ago    Pain Frequency Constant                TREATMENT   Ther-ex NuStep level 2-4 x 5 minutes for warm up during history, 4 minutes on build; Precor single-leg press, L: 40# x 2, 55# x 20; RLE: 40# 2 x 10; Sit to stand without UE support with Airex pad under feet without UE support 2 x 10;  Standing with3# ankle  weights BLE: BUE support used Hip abduction SLR x 10 reps BLE; Hip extension x  10 BLE; Hip flexion marches x 10 BLE; HS curls x 10 BLE;  Heel raises x 10reps with BUE support; BOSU (round side up) split squats alternating forward LE x 10 each, without UE support;   Neuromuscular Re-education NBOS eyes open/closed x 30s each; NBOS horizontal and vertical head turns x 30s each; Airex 6" alternating toe taps without UE support x 10 each side; Airex NBOS eyes open/closed x 30s each; Airex NBOS horizontal and vertical head turns x 30s each;   Pt educated throughout session about proper posture and technique with exercises. Improved exercise technique, movement at target joints, use of target muscles after min to mod verbal, visual, tactile cues.    Patient demonstrates excellent motivation during session.  Reintroduced leg press during session today and patient is able to complete.  Significantly more strength demonstrated on the left lower extremity compared to the right side.  Also increased the weight of his ankle weights on the right side to 3 pounds to perform standing exercises.  Patient continues to demonstrate increased difficulty on unstable surfaces such as Airex pad during balance  activities especially when eyes are closed.  Patient encouraged to continue HEP and follow-up as scheduled. The patient would benefit from further skilled PT intervention to continue to progress towards goals.         PT Short Term Goals - 06/27/20 0936      PT SHORT TERM GOAL #1   Title Patient will be independent in home exercise program to improve strength/mobility for better functional independence with ADLs and for self-management.    Baseline Pt reported that he is consistent with HEP, independent    Time 4    Period Weeks    Status Achieved    Target Date 06/18/20             PT Long Term Goals - 08/05/20 1329      PT LONG TERM GOAL #1   Title Patient will have increased score of 10 or more points on the FOTO survey in order to improve mobility and functional independence with ADLs.    Baseline scored 41/100 on 05/21/2020; 7/22 40, 8/23: 60/100    Time 8    Period Weeks    Status Achieved      PT LONG TERM GOAL #2   Title Patient will reduce falls risk as indicated by decreased TUG time to less than 20 seconds.    Baseline scored 37.9 sec with TUG on 05/21/2020; 21seconds with elevated rollator 7/22, 8/23: 13.62 sec with up and go walker    Time 8    Period Weeks    Status Achieved      PT LONG TERM GOAL #3   Title Patient will increase right UE and LE gross strength to 4+/5 throughout to improve functional strength for independent gait, increased standing tolerance and increased ADL ability.    Baseline grossly +3/5 to -4/5 right UE and LE strength on 05/21/2020; grossly 4-/5 for RUE, grossly 4-/5 for RLE on 7/22    Time 8    Period Weeks    Status On-going      PT LONG TERM GOAL #4   Title Patient will improve self-selected gait speed to 0.7 m/s or greater on 10 m walk to indicate reduced falls risk, improved mobility and independence with ADL's.    Baseline on 7/22 .57 m/sself selected, fast  .73  m/s with LOB pt reported R knee buckling, elevated rollator used,  8/23: 1.06 m/s with up and go walker    Time 8    Period Weeks    Status Achieved      PT LONG TERM GOAL #5   Title Patient will increase Berg Balance score by > 6 points to demonstrate decreased fall risk during functional activities.    Baseline 8/23: 37/56    Time 8    Period Weeks    Status New      PT LONG TERM GOAL #6   Title Patient (< 28 years old) will complete five times sit to stand test in < 10 seconds indicating an increased LE strength and improved balance.    Baseline 8/23: 22 sec    Time 8    Period Weeks    Status New      PT LONG TERM GOAL #7   Title Patient will improve 6 min walk test >2000 feet with LRAD for improved gait ability in community.    Baseline 8/23: not tested; 8/30 845, goal adjusted >2071ft    Time 8    Period Weeks    Status New                 Plan - 08/19/20 1415    Clinical Impression Statement Patient demonstrates excellent motivation during session.  Reintroduced leg press during session today and patient is able to complete.  Significantly more strength demonstrated on the left lower extremity compared to the right side.  Also increased the weight of his ankle weights on the right side to 3 pounds to perform standing exercises.  Patient continues to demonstrate increased difficulty on unstable surfaces such as Airex pad during balance activities especially when eyes are closed.  Patient encouraged to continue HEP and follow-up as scheduled. The patient would benefit from further skilled PT intervention to continue to progress towards goals.    Personal Factors and Comorbidities Comorbidity 3+;Time since onset of injury/illness/exacerbation    Comorbidities anxiety, back pain, COPD, headache, HTN, MI, sleep apnea, migraines    Examination-Activity Limitations Locomotion Level;Transfers;Bed Mobility;Stand;Stairs;Sleep;Squat;Bend    Examination-Participation Restrictions Driving;Medication Management    Stability/Clinical Decision Making  Unstable/Unpredictable    Rehab Potential Fair    PT Frequency 1x / week    PT Duration 8 weeks    PT Treatment/Interventions ADLs/Self Care Home Management;Canalith Repostioning;Moist Heat;Electrical Stimulation;Gait training;Stair training;Functional mobility training;Therapeutic activities;Therapeutic exercise;Balance training;Neuromuscular re-education;Patient/family education;Manual techniques;Passive range of motion;Vestibular    PT Next Visit Plan update HEP ea session for continued strengthening/balanceing at home    PT Home Exercise Plan Medbridge Access Code: [email or web address] updated this session    Consulted and Agree with Plan of Care Patient           Patient will benefit from skilled therapeutic intervention in order to improve the following deficits and impairments:  Decreased activity tolerance, Decreased balance, Decreased cognition, Decreased mobility, Difficulty walking, Abnormal gait, Decreased range of motion, Dizziness, Pain, Impaired flexibility, Decreased strength, Impaired sensation  Visit Diagnosis: Unsteadiness on feet  Muscle weakness (generalized)     Problem List Patient Active Problem List   Diagnosis Date Noted  . Degenerative spondylolisthesis 03/25/2020  . Physical deconditioning 03/04/2020  . Lobar pneumonia, unspecified organism (HCC) 03/04/2020  . Intractable hemiplegic migraine with status migrainosus 03/02/2020  . Current tobacco use 02/05/2020  . Abnormal findings on diagnostic imaging of lung 02/05/2020  . Shortness of breath 02/05/2020  . Herniated disc, cervical 01/23/2020  .  Cervical spondylosis with myelopathy and radiculopathy 01/23/2020  . Pre-operative respiratory examination 01/12/2020  . Weakness on right side of face 12/22/2019  . Osteoarthritis of spine with radiculopathy, lumbar region 12/11/2019  . Intractable migraine with aura with status migrainosus 11/09/2019  . Former smoker 08/27/2018  . Benign essential HTN 08/27/2018  .  Cough productive of purulent sputum 11/08/2017  . GERD (gastroesophageal reflux disease) 05/06/2017  . Contusion of hand 04/22/2017  . BP (high blood pressure) 04/22/2017  . Oxygen desaturation 04/22/2017  . Prostate lump 04/22/2017  . Unstable angina (HCC) 04/27/2016  . Chest pain 04/24/2016  . Morbid obesity (HCC) 01/29/2016  . Elevated ALT measurement 10/30/2015  . Nonspecific elevation of levels of transaminase and lactic acid dehydrogenase (ldh) 10/30/2015  . Reduced libido 10/29/2015  . Hyperlipidemia 08/06/2015  . Anxiety 08/06/2015  . Atherosclerosis of coronary artery 08/06/2015  . Childhood asthma 08/06/2015  . Chronic obstructive pulmonary disease (HCC) 08/06/2015  . OSA (obstructive sleep apnea) 08/06/2015  . Anxiety disorder 08/06/2015  . Atherosclerosis of native coronary artery of native heart with stable angina pectoris (HCC) 08/06/2015  . Uncomplicated asthma 08/06/2015   Lynnea Maizes PT, DPT, GCS  Corena Tilson 08/19/2020, 2:44 PM  [place] [facility] MAIN [facility] SERVICES [address] [place], [place], [address] Phone: [phone]   Fax:  [phone]  Name: OLIVER HEITZENRATER MRN: [id number] Date of Birth: 06/24/1971

## 2020-08-22 ENCOUNTER — Ambulatory Visit: Payer: BC Managed Care – PPO | Admitting: Physical Therapy

## 2020-08-27 ENCOUNTER — Ambulatory Visit: Payer: BC Managed Care – PPO

## 2020-08-28 ENCOUNTER — Ambulatory Visit: Payer: BC Managed Care – PPO | Admitting: Physical Therapy

## 2020-09-04 ENCOUNTER — Ambulatory Visit: Payer: BC Managed Care – PPO | Admitting: Physical Therapy

## 2020-09-09 ENCOUNTER — Other Ambulatory Visit: Payer: Self-pay

## 2020-09-09 ENCOUNTER — Ambulatory Visit: Payer: BC Managed Care – PPO | Attending: Neurology

## 2020-09-09 DIAGNOSIS — R2681 Unsteadiness on feet: Secondary | ICD-10-CM | POA: Diagnosis present

## 2020-09-09 DIAGNOSIS — M6281 Muscle weakness (generalized): Secondary | ICD-10-CM | POA: Insufficient documentation

## 2020-09-09 DIAGNOSIS — R42 Dizziness and giddiness: Secondary | ICD-10-CM | POA: Diagnosis present

## 2020-09-09 NOTE — Therapy (Signed)
[facility] [address] [place], [place], [address] Phone: [phone]   Fax:  [phone]  Physical Therapy Treatment  Patient Details  Name: Cory GriefJoseph D Pekala MRN: [id number] Date of Birth: 05/13/1971 Referring Provider (PT): Dr. Steele SizerH Shah   Encounter Date: 09/09/2020   PT End of Session - 09/09/20 1446    Visit Number 18    Number of Visits 32    Date for PT Re-Evaluation 09/23/20    Authorization Type eval: 05/21/20    Authorization Time Period PT FOTO completed; FOTO performed 7/22 scored 40    PT Start Time 1438    PT Stop Time 1516    PT Time Calculation (min) 38 min    Equipment Utilized During Treatment Gait belt    Activity Tolerance Patient tolerated treatment well;No increased pain    Behavior During Therapy WFL for tasks assessed/performed           Past Medical History:  Diagnosis Date  . Allergy   . Anginal pain (HCC)   . Anxiety   . Arthritis    lumbar spine  . Asthma   . Atrial fibrillation (HCC)   . Back pain    Severe Lumbar Pain  . CHF (congestive heart failure) (HCC)   . COPD (chronic obstructive pulmonary disease) (HCC)    Restrictive lung disease  . Coronary artery disease   . Dyspnea   . Dysrhythmia    afib  . GERD (gastroesophageal reflux disease)   . Headache    [name], onset October 2020, cluster headaches in the past  . History of kidney stones   . Hyperlipidemia   . Hypertension   . Myocardial infarction (HCC)    at age 49  . Pneumonia   . Restrictive lung disease   . Sleep apnea    unable to use cpap since onset of migraines    Past Surgical History:  Procedure Laterality Date  . ABDOMINAL EXPOSURE N/A 03/25/2020   Procedure: ABDOMINAL EXPOSURE;  Surgeon: Larina EarthlyEarly, Todd F, MD;  [facility];  Service: Vascular;  Laterality: N/A;  . ANTERIOR CERVICAL DECOMP/DISCECTOMY FUSION N/A 01/23/2020   Procedure: ANTERIOR CERVICAL DECOMPRESSION FUSION - CERVICAL  SIX-CERVICAL SEVEN;  Surgeon: Julio SicksPool, Henry, MD;  Location: [facility] OR;  Service: Neurosurgery;  Laterality: N/A;  . ANTERIOR LUMBAR FUSION N/A 03/25/2020   Procedure: ANTERIOR LUMBAR INTERBODY FUSION LUMBAR FIVE-SACRAL ONE.;  Surgeon: Julio SicksPool, Henry, MD;  Location: [facility] OR;  Service: Neurosurgery;  Laterality: N/A;  anterior  . BACK SURGERY  1996  . CARDIAC CATHETERIZATION Left 04/27/2016   Procedure: Left Heart Cath and Coronary Angiography;  Surgeon: Lamar BlinksBruce J Kowalski, MD;  Location: [facility] INVASIVE CV LAB;  Service: Cardiovascular;  Laterality: Left;  . CARDIAC CATHETERIZATION N/A 04/27/2016   Procedure: Intravascular Pressure Wire/FFR Study;  Surgeon: Alwyn Peawayne D Callwood, MD;  Location: [facility] INVASIVE CV LAB;  Service: Cardiovascular;  Laterality: N/A;  . CATHETERIZATION OF PULMONARY ARTERY WITH RETRIEVAL OF FOREIGN BODY Bilateral 04/07/2011   heart  . DEGENERATIVE SPONDYLOLISTHESIS    . KNEE ARTHROSCOPY Bilateral 2004  . LUMBAR DISC SURGERY  1999  . RADICULOPATHY, CERVICAL REGION    . TONSILLECTOMY      There were no vitals filed for this visit.   Subjective Assessment - 09/09/20 1443    Subjective Pt reports doing pretty good over the weekend. He says he continues to focus on walking stick for DME, still wants to progress beyond the RW at home.  Pt denies any recent falls since prior session.    Pertinent History Pt has a complex medical history. 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 He states that he fell 10-12 feet and struck his head, neck, and back. He was able to finish the course which took him approximately 45 minutes more to complete. He does endorse a second fall while finishing the course but did not suffer a second head injury. He states that he started getting headaches that day which have persisted since that time. 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 He does report improvement  in his headaches recently. Neurology was concerned about possible BPPV and have referred him for a vestibular evaluation. In addition since the injury, pt developed RUE and RLE pain and weakness. Pt states that NCV showed abnormal nerve conduction in RUE. He has since undergone a C7-7 ACDF on 01/23/20. He reports initial improvement in RUE strength, but states that he has a gradual return of his RUE weakness. He was also having significant RLE weakness and pain and underwent and L5-S1 anterior lumbar interbody fusion on 03/25/20. Patient reports that he does not have any back precautions but needs to wear the low back brace for one more month. Patient reports he has a follow-up appointment with the surgeon next month. Patient reports he has a follow-up appointment with Dr. Clelia Croft, neurologist, in August. He arrives to therapy ambulating with an upright rollator walker. Pt denies any mention of concussion after his injury. He has been having cognitive issues since his injury and has previously had a heavy metals screen as well as neurocognitive testing. He has had an MRI of his brain with and without contrast on 12/18/19. Results were mildly motion degraded examination. No evidence of acute intracranial abnormality. Minimal chronic small vessel ischemic disease. He has tremor in both his hands. He has a positive family history of Parkinson's disease in his grandfather. He is complaining of constant dizziness and unsteadiness which are worse with activity. Patient states that he frequently loses his balance and his wife has to steady him.    Pain Score 7    7-8/10 recent worse 2/2 weather front moving in         INTERVENTION THIS DATE:  -NuStep level 2-4 x 5 minutes for AA/ROM of bilat hips, lumbar spine -629ft AMB c wooden walking stick LUE, sequenced with RLE (2-point pattern)  *fatigue and onset foot scuff  -1281ft AMB c wooden walking stick LUE, sequenced with RLE (2-point pattern) c semi rigid AFO in Right  shoe *no onset foot scuff, adequate toe clearance, more difficulty with increased sway in frontal plane as hip flexors/abductors fatgue *667ft AMB with AFO, 2UE walking sticks, naturally defaults to a 4-point gait pattern with effort, reduced aberrant frontal plane sway of trunk.  -Education on adequate fit and use of AFO and appropriate footwear -education on benefits for BUE support in context of RLE fatigue to better control  *slight increased pain in BLE from activity   PT Short Term Goals - 06/27/20 0936      PT SHORT TERM GOAL #1   Title Patient will be independent in home exercise program to improve strength/mobility for better functional independence with ADLs and for self-management.    Baseline Pt reported that he is consistent with HEP, independent    Time 4    Period Weeks  Status Achieved    Target Date 06/18/20             PT Long Term Goals - 08/05/20 1329      PT LONG TERM GOAL #1   Title Patient will have increased score of 10 or more points on the FOTO survey in order to improve mobility and functional independence with ADLs.    Baseline scored 41/100 on 05/21/2020; 7/22 40, 8/23: 60/100    Time 8    Period Weeks    Status Achieved      PT LONG TERM GOAL #2   Title Patient will reduce falls risk as indicated by decreased TUG time to less than 20 seconds.    Baseline scored 37.9 sec with TUG on 05/21/2020; 21seconds with elevated rollator 7/22, 8/23: 13.62 sec with up and go walker    Time 8    Period Weeks    Status Achieved      PT LONG TERM GOAL #3   Title Patient will increase right UE and LE gross strength to 4+/5 throughout to improve functional strength for independent gait, increased standing tolerance and increased ADL ability.    Baseline grossly +3/5 to -4/5 right UE and LE strength on 05/21/2020; grossly 4-/5 for RUE, grossly 4-/5 for RLE on 7/22    Time 8    Period Weeks    Status On-going      PT LONG TERM GOAL #4   Title Patient will  improve self-selected gait speed to 0.7 m/s or greater on 10 m walk to indicate reduced falls risk, improved mobility and independence with ADL's.    Baseline on 7/22 .57 m/sself selected, fast  .73 m/s with LOB pt reported R knee buckling, elevated rollator used, 8/23: 1.06 m/s with up and go walker    Time 8    Period Weeks    Status Achieved      PT LONG TERM GOAL #5   Title Patient will increase Berg Balance score by > 6 points to demonstrate decreased fall risk during functional activities.    Baseline 8/23: 37/56    Time 8    Period Weeks    Status New      PT LONG TERM GOAL #6   Title Patient (< 60 years old) will complete five times sit to stand test in < 10 seconds indicating an increased LE strength and improved balance.    Baseline 8/23: 22 sec    Time 8    Period Weeks    Status New      PT LONG TERM GOAL #7   Title Patient will improve 6 min walk test >2000 feet with LRAD for improved gait ability in community.    Baseline 8/23: not tested; 8/30 845, goal adjusted >2032ft    Time 8    Period Weeks    Status New                 Plan - 09/09/20 1446    Clinical Impression Statement Continued with POC. Pt continues to be motivated to progress his mobility. Pt recently self DC his RW and transitioned to single wooden walking staff to simplify his life when going out into public. Session largely revolves around gait training with new device, as well as trial of other equipment that might facilitate energy conservation, motor control, safety. Pt AMB 500+ft with walking staff in LUE paired with RLE, but distance is limited due to rapid onset Rt foot scuff beyond  that distance. Pt trialed with Rt AFO, easily able to AMB twice as far without toe clearance difficulty. Pt also given a chance to practice 2 UE support c addition of trekking pole, 4-point gait, deonstrates improved control of trunk, decreaed lateral sway at onset of Rt hip/knee fatigue. Chartered loss adjuster gives education  regarding safe pacing and appropriate activity selection for AFO use. Pt is pleased with utility ot AFO and agreeable to being fit for one. Order sent to neurologist Dr. Sherryll Burger (referring provider).    Personal Factors and Comorbidities Comorbidity 3+;Time since onset of injury/illness/exacerbation    Comorbidities anxiety, back pain, COPD, headache, HTN, MI, sleep apnea, migraines    Examination-Activity Limitations Locomotion Level;Transfers;Bed Mobility;Stand;Stairs;Sleep;Squat;Bend    Examination-Participation Restrictions Driving;Medication Management    Stability/Clinical Decision Making Unstable/Unpredictable    Clinical Decision Making High    Rehab Potential Fair    PT Frequency 1x / week    PT Duration 8 weeks    PT Treatment/Interventions ADLs/Self Care Home Management;Canalith Repostioning;Moist Heat;Electrical Stimulation;Gait training;Stair training;Functional mobility training;Therapeutic activities;Therapeutic exercise;Balance training;Neuromuscular re-education;Patient/family education;Manual techniques;Passive range of motion;Vestibular    PT Next Visit Plan update HEP ea session for continued strengthening/balanceing at home    PT Home Exercise Plan Medbridge Access Code: [email or web address] updated this session    Consulted and Agree with Plan of Care Patient           Patient will benefit from skilled therapeutic intervention in order to improve the following deficits and impairments:  Decreased activity tolerance, Decreased balance, Decreased cognition, Decreased mobility, Difficulty walking, Abnormal gait, Decreased range of motion, Dizziness, Pain, Impaired flexibility, Decreased strength, Impaired sensation  Visit Diagnosis: Unsteadiness on feet  Muscle weakness (generalized)  Dizziness and giddiness     Problem List Patient Active Problem List   Diagnosis Date Noted  . Degenerative spondylolisthesis 03/25/2020  . Physical deconditioning 03/04/2020  . Lobar pneumonia,  unspecified organism (HCC) 03/04/2020  . Intractable hemiplegic migraine with status migrainosus 03/02/2020  . Current tobacco use 02/05/2020  . Abnormal findings on diagnostic imaging of lung 02/05/2020  . Shortness of breath 02/05/2020  . Herniated disc, cervical 01/23/2020  . Cervical spondylosis with myelopathy and radiculopathy 01/23/2020  . Pre-operative respiratory examination 01/12/2020  . Weakness on right side of face 12/22/2019  . Osteoarthritis of spine with radiculopathy, lumbar region 12/11/2019  . Intractable migraine with aura with status migrainosus 11/09/2019  . Former smoker 08/27/2018  . Benign essential HTN 08/27/2018  . Cough productive of purulent sputum 11/08/2017  . GERD (gastroesophageal reflux disease) 05/06/2017  . Contusion of hand 04/22/2017  . BP (high blood pressure) 04/22/2017  . Oxygen desaturation 04/22/2017  . Prostate lump 04/22/2017  . Unstable angina (HCC) 04/27/2016  . Chest pain 04/24/2016  . Morbid obesity (HCC) 01/29/2016  . Elevated ALT measurement 10/30/2015  . Nonspecific elevation of levels of transaminase and lactic acid dehydrogenase (ldh) 10/30/2015  . Reduced libido 10/29/2015  . Hyperlipidemia 08/06/2015  . Anxiety 08/06/2015  . Atherosclerosis of coronary artery 08/06/2015  . Childhood asthma 08/06/2015  . Chronic obstructive pulmonary disease (HCC) 08/06/2015  . OSA (obstructive sleep apnea) 08/06/2015  . Anxiety disorder 08/06/2015  . Atherosclerosis of native coronary artery of native heart with stable angina pectoris (HCC) 08/06/2015  . Uncomplicated asthma 08/06/2015   .4:12 PM, 09/09/20 Rosamaria Lints, PT, DPT Physical Therapist - [facility]  Outpatient Physical Therapy- Main Campus [phone]     Emiah Pellicano C 09/09/2020, 4:02 PM  [facility] MAIN [facility] SERVICES [address] [place], [place], [address] Phone: [phone]   Fax:   [phone]  Name: JAEVIAN SHEAN MRN: [id number] Date of Birth: [birth year]

## 2020-09-11 ENCOUNTER — Ambulatory Visit: Payer: BC Managed Care – PPO | Admitting: Physical Therapy

## 2020-09-16 ENCOUNTER — Other Ambulatory Visit: Payer: Self-pay

## 2020-09-16 ENCOUNTER — Ambulatory Visit: Payer: BC Managed Care – PPO | Admitting: Physical Therapy

## 2020-09-16 ENCOUNTER — Encounter: Payer: Self-pay | Admitting: Physical Therapy

## 2020-09-16 DIAGNOSIS — R42 Dizziness and giddiness: Secondary | ICD-10-CM

## 2020-09-16 DIAGNOSIS — M6281 Muscle weakness (generalized): Secondary | ICD-10-CM

## 2020-09-16 DIAGNOSIS — R2681 Unsteadiness on feet: Secondary | ICD-10-CM | POA: Diagnosis not present

## 2020-09-16 NOTE — Therapy (Signed)
[facility] MAIN [facility] SERVICES [address] [place], [place], [address] Phone: [phone]   Fax:  [phone]  Physical Therapy Treatment  Patient Details  Name: Cory Espinoza MRN: [id number] Date of Birth: 12/28/1970 Referring Provider (PT): Dr. Steele Sizer   Encounter Date: 09/16/2020   PT End of Session - 09/16/20 1608    Visit Number 19    Number of Visits 32    Date for PT Re-Evaluation 09/23/20    Authorization Type eval: 05/21/20    Authorization Time Period PT FOTO completed; FOTO performed 7/22 scored 40    PT Start Time 1600    PT Stop Time 1640    PT Time Calculation (min) 40 min    Equipment Utilized During Treatment Gait belt    Activity Tolerance Patient tolerated treatment well;No increased pain    Behavior During Therapy WFL for tasks assessed/performed           Past Medical History:  Diagnosis Date  . Allergy   . Anginal pain (HCC)   . Anxiety   . Arthritis    lumbar spine  . Asthma   . Atrial fibrillation (HCC)   . Back pain    Severe Lumbar Pain  . CHF (congestive heart failure) (HCC)   . COPD (chronic obstructive pulmonary disease) (HCC)    Restrictive lung disease  . Coronary artery disease   . Dyspnea   . Dysrhythmia    afib  . GERD (gastroesophageal reflux disease)   . Headache    [name], onset October 2020, cluster headaches in the past  . History of kidney stones   . Hyperlipidemia   . Hypertension   . Myocardial infarction (HCC)    at age 84  . Pneumonia   . Restrictive lung disease   . Sleep apnea    unable to use cpap since onset of migraines    Past Surgical History:  Procedure Laterality Date  . ABDOMINAL EXPOSURE N/A 03/25/2020   Procedure: ABDOMINAL EXPOSURE;  Surgeon: Larina Earthly, MD;  Location: [facility] OR;  Service: Vascular;  Laterality: N/A;  . ANTERIOR CERVICAL DECOMP/DISCECTOMY FUSION N/A 01/23/2020   Procedure: ANTERIOR CERVICAL DECOMPRESSION FUSION - CERVICAL  SIX-CERVICAL SEVEN;  Surgeon: Julio Sicks, MD;  Location: [facility] OR;  Service: Neurosurgery;  Laterality: N/A;  . ANTERIOR LUMBAR FUSION N/A 03/25/2020   Procedure: ANTERIOR LUMBAR INTERBODY FUSION LUMBAR FIVE-SACRAL ONE.;  Surgeon: Julio Sicks, MD;  Location: [facility] OR;  Service: Neurosurgery;  Laterality: N/A;  anterior  . BACK SURGERY  1996  . CARDIAC CATHETERIZATION Left 04/27/2016   Procedure: Left Heart Cath and Coronary Angiography;  Surgeon: Lamar Blinks, MD;  Location: [facility] INVASIVE CV LAB;  Service: Cardiovascular;  Laterality: Left;  . CARDIAC CATHETERIZATION N/A 04/27/2016   Procedure: Intravascular Pressure Wire/FFR Study;  Surgeon: Alwyn Pea, MD;  Location: [facility] INVASIVE CV LAB;  Service: Cardiovascular;  Laterality: N/A;  . CATHETERIZATION OF PULMONARY ARTERY WITH RETRIEVAL OF FOREIGN BODY Bilateral 04/07/2011   heart  . DEGENERATIVE SPONDYLOLISTHESIS    . KNEE ARTHROSCOPY Bilateral 2004  . LUMBAR DISC SURGERY  1999  . RADICULOPATHY, CERVICAL REGION    . TONSILLECTOMY      There were no vitals filed for this visit.   Subjective Assessment - 09/16/20 1607    Subjective Patient is having a good day. Has 4/10 pain in LE.    Pertinent History Pt has a complex medical history. 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 He states that he fell 10-12 feet and struck his head, neck, and back. He was able to finish the course which took him approximately 45 minutes more to complete. He does endorse a second fall while finishing the course but did not suffer a second head injury. He states that he started getting headaches that day which have persisted since that time. 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 He does report improvement in his headaches recently. Neurology was concerned about possible BPPV and have referred him for a vestibular evaluation. In addition since the  injury, pt developed RUE and RLE pain and weakness. Pt states that NCV showed abnormal nerve conduction in RUE. He has since undergone a C7-7 ACDF on 01/23/20. He reports initial improvement in RUE strength, but states that he has a gradual return of his RUE weakness. He was also having significant RLE weakness and pain and underwent and L5-S1 anterior lumbar interbody fusion on 03/25/20. Patient reports that he does not have any back precautions but needs to wear the low back brace for one more month. Patient reports he has a follow-up appointment with the surgeon next month. Patient reports he has a follow-up appointment with Dr. Clelia Croft, neurologist, in August. He arrives to therapy ambulating with an upright rollator walker. Pt denies any mention of concussion after his injury. He has been having cognitive issues since his injury and has previously had a heavy metals screen as well as neurocognitive testing. He has had an MRI of his brain with and without contrast on 12/18/19. Results were mildly motion degraded examination. No evidence of acute intracranial abnormality. Minimal chronic small vessel ischemic disease. He has tremor in both his hands. He has a positive family history of Parkinson's disease in his grandfather. He is complaining of constant dizziness and unsteadiness which are worse with activity. Patient states that he frequently loses his balance and his wife has to steady him.    Limitations Lifting;Standing;Walking;House hold activities    Diagnostic tests He has had an MRI of his brain with and without contrast on 12/18/19. Results were mildly motion degraded examination. No evidence of acute intracranial abnormality. Minimal chronic small vessel ischemic disease.    Patient Stated Goals to be able to ambulate with a cane or no AD, to be able to drive, to be able to ride his motorcycle    Pain Onset More than a month ago             Treatment: Matrix fwd/ bwd x 3 reps 22. 5 lbs - left  ankle eversion and exercise stopped Leg press x 20 x 2  , 70 lbs x 2  baps board CCW, cW and DF/PF x 20  Ankle eversion with RTB x 20  Patient performed with instruction, verbal cues, tactile cues of therapist: goal: increase tissue extensibility, promote proper posture, improve mobility                        PT Education - 09/16/20 1608    Education Details HEP    Person(s) Educated Patient    Methods Explanation    Comprehension Verbalized understanding;Tactile cues required            PT Short Term Goals - 06/27/20 0936      PT SHORT TERM GOAL #1   Title Patient will  be independent in home exercise program to improve strength/mobility for better functional independence with ADLs and for self-management.    Baseline Pt reported that he is consistent with HEP, independent    Time 4    Period Weeks    Status Achieved    Target Date 06/18/20             PT Long Term Goals - 08/05/20 1329      PT LONG TERM GOAL #1   Title Patient will have increased score of 10 or more points on the FOTO survey in order to improve mobility and functional independence with ADLs.    Baseline scored 41/100 on 05/21/2020; 7/22 40, 8/23: 60/100    Time 8    Period Weeks    Status Achieved      PT LONG TERM GOAL #2   Title Patient will reduce falls risk as indicated by decreased TUG time to less than 20 seconds.    Baseline scored 37.9 sec with TUG on 05/21/2020; 21seconds with elevated rollator 7/22, 8/23: 13.62 sec with up and go walker    Time 8    Period Weeks    Status Achieved      PT LONG TERM GOAL #3   Title Patient will increase right UE and LE gross strength to 4+/5 throughout to improve functional strength for independent gait, increased standing tolerance and increased ADL ability.    Baseline grossly +3/5 to -4/5 right UE and LE strength on 05/21/2020; grossly 4-/5 for RUE, grossly 4-/5 for RLE on 7/22    Time 8    Period Weeks    Status On-going      PT  LONG TERM GOAL #4   Title Patient will improve self-selected gait speed to 0.7 m/s or greater on 10 m walk to indicate reduced falls risk, improved mobility and independence with ADL's.    Baseline on 7/22 .57 m/sself selected, fast  .73 m/s with LOB pt reported R knee buckling, elevated rollator used, 8/23: 1.06 m/s with up and go walker    Time 8    Period Weeks    Status Achieved      PT LONG TERM GOAL #5   Title Patient will increase Berg Balance score by > 6 points to demonstrate decreased fall risk during functional activities.    Baseline 8/23: 37/56    Time 8    Period Weeks    Status New      PT LONG TERM GOAL #6   Title Patient (< 48 years old) will complete five times sit to stand test in < 10 seconds indicating an increased LE strength and improved balance.    Baseline 8/23: 22 sec    Time 8    Period Weeks    Status New      PT LONG TERM GOAL #7   Title Patient will improve 6 min walk test >2000 feet with LRAD for improved gait ability in community.    Baseline 8/23: not tested; 8/30 845, goal adjusted >201800ft    Time 8    Period Weeks    Status New                 Plan - 09/16/20 1608    Clinical Impression Statement Patient performs ankle strengthening exercises and stability exercises . Patient is recommended an AFO for RLE. He needs instruction for HEP and will continue to benefit from skilled PT to improve mobility.    Personal Factors  and Comorbidities Comorbidity 3+;Time since onset of injury/illness/exacerbation    Comorbidities anxiety, back pain, COPD, headache, HTN, MI, sleep apnea, migraines    Examination-Activity Limitations Locomotion Level;Transfers;Bed Mobility;Stand;Stairs;Sleep;Squat;Bend    Examination-Participation Restrictions Driving;Medication Management    Stability/Clinical Decision Making Unstable/Unpredictable    Rehab Potential Fair    PT Frequency 1x / week    PT Duration 8 weeks    PT Treatment/Interventions ADLs/Self Care  Home Management;Canalith Repostioning;Moist Heat;Electrical Stimulation;Gait training;Stair training;Functional mobility training;Therapeutic activities;Therapeutic exercise;Balance training;Neuromuscular re-education;Patient/family education;Manual techniques;Passive range of motion;Vestibular    PT Next Visit Plan update HEP ea session for continued strengthening/balanceing at home    PT Home Exercise Plan Medbridge Access Code: [email or web address] updated this session    Consulted and Agree with Plan of Care Patient           Patient will benefit from skilled therapeutic intervention in order to improve the following deficits and impairments:  Decreased activity tolerance, Decreased balance, Decreased cognition, Decreased mobility, Difficulty walking, Abnormal gait, Decreased range of motion, Dizziness, Pain, Impaired flexibility, Decreased strength, Impaired sensation  Visit Diagnosis: Muscle weakness (generalized)  Unsteadiness on feet  Dizziness and giddiness     Problem List Patient Active Problem List   Diagnosis Date Noted  . Degenerative spondylolisthesis 03/25/2020  . Physical deconditioning 03/04/2020  . Lobar pneumonia, unspecified organism (HCC) 03/04/2020  . Intractable hemiplegic migraine with status migrainosus 03/02/2020  . Current tobacco use 02/05/2020  . Abnormal findings on diagnostic imaging of lung 02/05/2020  . Shortness of breath 02/05/2020  . Herniated disc, cervical 01/23/2020  . Cervical spondylosis with myelopathy and radiculopathy 01/23/2020  . Pre-operative respiratory examination 01/12/2020  . Weakness on right side of face 12/22/2019  . Osteoarthritis of spine with radiculopathy, lumbar region 12/11/2019  . Intractable migraine with aura with status migrainosus 11/09/2019  . Former smoker 08/27/2018  . Benign essential HTN 08/27/2018  . Cough productive of purulent sputum 11/08/2017  . GERD (gastroesophageal reflux disease) 05/06/2017  . Contusion of  hand 04/22/2017  . BP (high blood pressure) 04/22/2017  . Oxygen desaturation 04/22/2017  . Prostate lump 04/22/2017  . Unstable angina (HCC) 04/27/2016  . Chest pain 04/24/2016  . Morbid obesity (HCC) 01/29/2016  . Elevated ALT measurement 10/30/2015  . Nonspecific elevation of levels of transaminase and lactic acid dehydrogenase (ldh) 10/30/2015  . Reduced libido 10/29/2015  . Hyperlipidemia 08/06/2015  . Anxiety 08/06/2015  . Atherosclerosis of coronary artery 08/06/2015  . Childhood asthma 08/06/2015  . Chronic obstructive pulmonary disease (HCC) 08/06/2015  . OSA (obstructive sleep apnea) 08/06/2015  . Anxiety disorder 08/06/2015  . Atherosclerosis of native coronary artery of native heart with stable angina pectoris (HCC) 08/06/2015  . Uncomplicated asthma 08/06/2015    Ezekiel Ina, [place] DPT 09/16/2020, 4:11 PM  [facility] MAIN [facility] SERVICES [address] [place], [place], [address] Phone: [phone]   Fax:  [phone]  Name: Cory Espinoza MRN: [id number] Date of Birth: [birth year]

## 2020-09-18 ENCOUNTER — Ambulatory Visit: Payer: BC Managed Care – PPO | Admitting: Physical Therapy

## 2020-09-23 ENCOUNTER — Ambulatory Visit: Payer: BC Managed Care – PPO | Admitting: Physical Therapy

## 2020-09-30 ENCOUNTER — Ambulatory Visit: Payer: BC Managed Care – PPO | Admitting: Physical Therapy

## 2020-09-30 ENCOUNTER — Other Ambulatory Visit: Payer: Self-pay

## 2020-09-30 DIAGNOSIS — R2681 Unsteadiness on feet: Secondary | ICD-10-CM

## 2020-09-30 DIAGNOSIS — R42 Dizziness and giddiness: Secondary | ICD-10-CM

## 2020-09-30 DIAGNOSIS — M6281 Muscle weakness (generalized): Secondary | ICD-10-CM

## 2020-09-30 NOTE — Therapy (Addendum)
[place] MAIN [facility] SERVICES [address] [place], [place], [address] Phone: [phone]   Fax:  [phone]  Physical Therapy Treatment Physical Therapy Progress Note   Dates of reporting period 08/06/20   to 09/30/20  Patient Details  Name: Cory Espinoza MRN: [id number] Date of Birth: [birth year] Referring Provider (PT): Dr. Joselyn Arrow   Encounter Date: 09/30/2020   PT End of Session - 09/30/20 1508    Visit Number 20    Number of Visits 32    Date for PT Re-Evaluation 09/23/20    Authorization Type eval: 05/21/20    Authorization Time Period PT FOTO completed; FOTO performed 7/22 scored 40    PT Start Time 1505    PT Stop Time 1550    PT Time Calculation (min) 45 min    Equipment Utilized During Treatment Gait belt    Activity Tolerance Patient tolerated treatment well;No increased pain    Behavior During Therapy WFL for tasks assessed/performed           Past Medical History:  Diagnosis Date  . Allergy   . Anginal pain ([place])   . Anxiety   . Arthritis    lumbar spine  . Asthma   . Atrial fibrillation ([place])   . Back pain    Severe Lumbar Pain  . CHF (congestive heart failure) ([place])   . COPD (chronic obstructive pulmonary disease) ([facility])    Restrictive lung disease  . Coronary artery disease   . Dyspnea   . Dysrhythmia    afib  . GERD (gastroesophageal reflux disease)   . Headache    [name], onset October 2020, cluster headaches in the past  . History of kidney stones   . Hyperlipidemia   . Hypertension   . Myocardial infarction ([place])    at age 49  . Pneumonia   . Restrictive lung disease   . Sleep apnea    unable to use cpap since onset of migraines    Past Surgical History:  Procedure Laterality Date  . ABDOMINAL EXPOSURE N/A 03/25/2020   Procedure: ABDOMINAL EXPOSURE;  Surgeon: Rosetta Posner, MD;  Location: [facility] OR;  Service: Vascular;  Laterality: N/A;  . ANTERIOR CERVICAL DECOMP/DISCECTOMY FUSION N/A  01/23/2020   Procedure: ANTERIOR CERVICAL DECOMPRESSION FUSION - CERVICAL SIX-CERVICAL SEVEN;  Surgeon: Earnie Larsson, MD;  Location: [place];  Service: Neurosurgery;  Laterality: N/A;  . ANTERIOR LUMBAR FUSION N/A 03/25/2020   Procedure: ANTERIOR LUMBAR INTERBODY FUSION LUMBAR FIVE-SACRAL ONE.;  Surgeon: Earnie Larsson, MD;  Location: [place];  Service: Neurosurgery;  Laterality: N/A;  anterior  . BACK SURGERY  1996  . CARDIAC CATHETERIZATION Left 04/27/2016   Procedure: Left Heart Cath and Coronary Angiography;  Surgeon: Corey Skains, MD;  Location: [place] CV LAB;  Service: Cardiovascular;  Laterality: Left;  . CARDIAC CATHETERIZATION N/A 04/27/2016   Procedure: Intravascular Pressure Wire/FFR Study;  Surgeon: Yolonda Kida, MD;  Location: [place] [facility] LAB;  Service: Cardiovascular;  Laterality: N/A;  . CATHETERIZATION OF PULMONARY ARTERY WITH RETRIEVAL OF FOREIGN BODY Bilateral 04/07/2011   heart  . DEGENERATIVE SPONDYLOLISTHESIS    . KNEE ARTHROSCOPY Bilateral 2004  . [place] SURGERY  1999  . RADICULOPATHY, CERVICAL REGION    . TONSILLECTOMY      There were no vitals filed for this visit.   Subjective Assessment - 09/30/20 1507    Subjective Patient is having a good day. Has 8/10 pain in LE.  Pertinent History Pt has a complex medical history. He reports that he fell during a [employer] training in [place] in the fall of 2020. He states that he fell 10-12 feet and struck his head, neck, and back. He was able to finish the course which took him approximately 45 minutes more to complete. He does endorse a second fall while finishing the course but did not suffer a second head injury. He states that he started getting headaches that day which have persisted since that time. 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 He does report improvement in his headaches recently. Neurology was concerned about possible BPPV  and have referred him for a vestibular evaluation. In addition since the injury, pt developed RUE and RLE pain and weakness. Pt states that NCV showed abnormal nerve conduction in RUE. He has since undergone a C7-7 ACDF on 01/23/20. He reports initial improvement in RUE strength, but states that he has a gradual return of his RUE weakness. He was also having significant RLE weakness and pain and underwent and L5-S1 anterior lumbar interbody fusion on 03/25/20. Patient reports that he does not have any back precautions but needs to wear the low back brace for one more month. Patient reports he has a follow-up appointment with the surgeon next month. Patient reports he has a follow-up appointment with Dr. Brigitte Pulse, neurologist, in August. He arrives to therapy ambulating with an upright rollator walker. Pt denies any mention of concussion after his injury. He has been having cognitive issues since his injury and has previously had a heavy metals screen as well as neurocognitive testing. He has had an MRI of his brain with and without contrast on 12/18/19. Results were mildly motion degraded examination. No evidence of acute intracranial abnormality. Minimal chronic small vessel ischemic disease. He has tremor in both his hands. He has a positive family history of Parkinson's disease in his grandfather. He is complaining of constant dizziness and unsteadiness which are worse with activity. Patient states that he frequently loses his balance and his wife has to steady him.    Limitations Lifting;Standing;Walking;House hold activities    Diagnostic tests He has had an MRI of his brain with and without contrast on 12/18/19. Results were mildly motion degraded examination. No evidence of acute intracranial abnormality. Minimal chronic small vessel ischemic disease.    Patient Stated Goals to be able to ambulate with a cane or no AD, to be able to drive, to be able to ride his motorcycle    Pain Onset More than a month ago               [facility] PT Assessment - 09/30/20 0001      Berg Balance Test   Sit to Stand Able to stand without using hands and stabilize independently    Standing Unsupported Able to stand safely 2 minutes    Sitting with Back Unsupported but Feet Supported on Floor or Stool Able to sit safely and securely 2 minutes    Stand to Sit Sits safely with minimal use of hands    Transfers Able to transfer safely, minor use of hands    Standing Unsupported with Eyes Closed Able to stand 10 seconds with supervision    Standing Unsupported with Feet Together Able to place feet together independently and stand for 1 minute with supervision    From Standing, Reach Forward with Outstretched Arm Can reach forward >5  cm safely (2")    From Standing Position, Pick up Object from [place] to pick up shoe, needs supervision    From Standing Position, Turn to Look Behind Over each Shoulder Looks behind from both sides and weight shifts well    Turn 360 Degrees Needs close supervision or verbal cueing    Standing Unsupported, Alternately Place Feet on Step/Stool Able to complete 4 steps without aid or supervision    Standing Unsupported, One Foot in [place] help to step but can hold 15 seconds    Standing on One Leg Tries to lift leg/unable to hold 3 seconds but remains standing independently    Total Score 40            Treatment: Goals progressed and outcome measures performed                      PT Education - 09/30/20 1508    Education Details HEP    Person(s) Educated Patient    Methods Explanation    Comprehension Verbalized understanding            PT Short Term Goals - 06/27/20 0936      PT SHORT TERM GOAL #1   Title Patient will be independent in home exercise program to improve strength/mobility for better functional independence with ADLs and for self-management.    Baseline Pt reported that he is consistent with HEP, independent    Time 4    Period Weeks     Status Achieved    Target Date 06/18/20             PT Long Term Goals - 09/30/20 1510      PT LONG TERM GOAL #1   Title Patient will have increased score of 10 or more points on the FOTO survey in order to improve mobility and functional independence with ADLs.    Baseline scored 41/100 on 05/21/2020; 7/22 40, 8/23: 60/100, 09/30/20=40    Time 8    Period Weeks    Status Achieved    Target Date 11/19/20      PT LONG TERM GOAL #2   Title Patient will reduce falls risk as indicated by decreased TUG time to less than 20 seconds.    Baseline scored 37.9 sec with TUG on 05/21/2020; 21seconds with elevated rollator 7/22, 8/23: 13.62 sec with up and go walker    Time 8    Period Weeks    Status Achieved    Target Date 11/19/20      PT LONG TERM GOAL #3   Title Patient will increase right UE and LE gross strength to 4+/5 throughout to improve functional strength for independent gait, increased standing tolerance and increased ADL ability.    Baseline grossly +3/5 to -4/5 right UE and LE strength on 05/21/2020; grossly 4-/5 for RUE, grossly 4-/5 for RLE on 7/22    Time 8    Period Weeks    Status On-going    Target Date 11/19/20      PT LONG TERM GOAL #4   Title Patient will improve self-selected gait speed to 0.7 m/s or greater on 10 m walk to indicate reduced falls risk, improved mobility and independence with ADL's.    Baseline on 7/22 .57 m/sself selected, fast  .73 m/s with LOB pt reported R knee buckling, elevated rollator used, 8/23: 1.06 m/s with up and go walker    Time 8    Period Weeks  Status Achieved      PT LONG TERM GOAL #5   Title Patient will increase Berg Balance score by > 6 points to demonstrate decreased fall risk during functional activities.    Baseline 8/23: 37/56, 09/30/20=40/56    Time 8    Period Weeks    Status Partially Met    Target Date 11/19/20      PT LONG TERM GOAL #6   Title Patient (< 79 years old) will complete five times sit to stand  test in < 10 seconds indicating an increased LE strength and improved balance.    Baseline 8/23: 22 sec, 09/30/20=13.04 sec    Time 8    Period Weeks    Status Partially Met    Target Date 11/19/20      PT LONG TERM GOAL #7   Title Patient will improve 6 min walk test >2000 feet with LRAD for improved gait ability in community.    Baseline 8/23: not tested; 8/30 845, goal adjusted >2040f, 09/30/20=600 feet - stopped test due to R ankle catching making gait unsafe    Time 8    Period Weeks    Status On-going    Target Date 11/19/20                 Plan - 09/30/20 1509    Clinical Impression Statement Patient's condition has the potential to improve in response to therapy. Maximum improvement is yet to be obtained. The anticipated improvement is attainable and reasonable in a generally predictable time. Patient has deficits in strength, balance and gait due to weakness and abnormal tone/ tremors . Patient reports that his functional mobility depends on how active he is during the day. His outcome measures improved and his goals were reviewed and progressed. Patient will continue to benefit from skilled PT to improve mobility.     Personal Factors and Comorbidities Comorbidity 3+;Time since onset of injury/illness/exacerbation    Comorbidities anxiety, back pain, COPD, headache, HTN, MI, sleep apnea, migraines    Examination-Activity Limitations Locomotion Level;Transfers;Bed Mobility;Stand;Stairs;Sleep;Squat;Bend    Examination-Participation Restrictions Driving;Medication Management    Stability/Clinical Decision Making Unstable/Unpredictable    Rehab Potential Fair    PT Frequency 1x / week    PT Duration 8 weeks    PT Treatment/Interventions ADLs/Self Care Home Management;Canalith Repostioning;Moist Heat;Electrical Stimulation;Gait training;Stair training;Functional mobility training;Therapeutic activities;Therapeutic exercise;Balance training;Neuromuscular  re-education;Patient/family education;Manual techniques;Passive range of motion;Vestibular    PT Next Visit Plan update HEP ea session for continued strengthening/balanceing at home    PT HMedonAccess Code: [email or web address] updated this session    Consulted and Agree with Plan of Care Patient           Patient will benefit from skilled therapeutic intervention in order to improve the following deficits and impairments:  Decreased activity tolerance, Decreased balance, Decreased cognition, Decreased mobility, Difficulty walking, Abnormal gait, Decreased range of motion, Dizziness, Pain, Impaired flexibility, Decreased strength, Impaired sensation  Visit Diagnosis: Unsteadiness on feet  Muscle weakness (generalized)  Dizziness and giddiness     Problem List Patient Active Problem List   Diagnosis Date Noted  . Degenerative spondylolisthesis 03/25/2020  . Physical deconditioning 03/04/2020  . Lobar pneumonia, unspecified organism ([place] 03/04/2020  . Intractable hemiplegic migraine with status migrainosus 03/02/2020  . Current tobacco use 02/05/2020  . Abnormal findings on diagnostic imaging of lung 02/05/2020  . Shortness of breath 02/05/2020  . Herniated disc, cervical 01/23/2020  . Cervical spondylosis with myelopathy and radiculopathy 01/23/2020  .  Pre-operative respiratory examination 01/12/2020  . Weakness on right side of face 12/22/2019  . Osteoarthritis of spine with radiculopathy, lumbar region 12/11/2019  . Intractable migraine with aura with status migrainosus 11/09/2019  . Former smoker 08/27/2018  . Benign essential HTN 08/27/2018  . Cough productive of purulent sputum 11/08/2017  . GERD (gastroesophageal reflux disease) 05/06/2017  . Contusion of hand 04/22/2017  . BP (high blood pressure) 04/22/2017  . Oxygen desaturation 04/22/2017  . Prostate lump 04/22/2017  . Unstable angina ([place]) 04/27/2016  . Chest pain 04/24/2016  . Morbid obesity ([place])  01/29/2016  . Elevated ALT measurement 10/30/2015  . Nonspecific elevation of levels of transaminase and lactic acid dehydrogenase (ldh) 10/30/2015  . Reduced libido 10/29/2015  . Hyperlipidemia 08/06/2015  . Anxiety 08/06/2015  . Atherosclerosis of coronary artery 08/06/2015  . Childhood asthma 08/06/2015  . Chronic obstructive pulmonary disease ([place]) 08/06/2015  . OSA (obstructive sleep apnea) 08/06/2015  . Anxiety disorder 08/06/2015  . Atherosclerosis of native coronary artery of native heart with stable angina pectoris ([place]) 08/06/2015  . Uncomplicated asthma 98/09/2547    Alanson Puls PT DPT 09/30/2020, 3:58 PM  [place] MAIN [facility] SERVICES [address] [place], [place], [address] Phone: [phone]   Fax:  [phone]  Name: STONY STEGMANN MRN: [id number] Date of Birth: 12/13/1970

## 2020-10-07 ENCOUNTER — Ambulatory Visit: Payer: BC Managed Care – PPO

## 2020-10-14 ENCOUNTER — Ambulatory Visit: Payer: BC Managed Care – PPO | Attending: Neurology

## 2020-10-14 ENCOUNTER — Other Ambulatory Visit: Payer: Self-pay

## 2020-10-14 DIAGNOSIS — M6281 Muscle weakness (generalized): Secondary | ICD-10-CM | POA: Diagnosis present

## 2020-10-14 DIAGNOSIS — R2681 Unsteadiness on feet: Secondary | ICD-10-CM

## 2020-10-14 NOTE — Addendum Note (Signed)
Addended by: Ezekiel Ina on: 10/14/2020 04:38 PM   Modules accepted: Orders

## 2020-10-14 NOTE — Therapy (Signed)
[place] MAIN [facility] SERVICES [address] [place], [place], [address] Phone: [phone]   Fax:  [phone]  Physical Therapy Treatment  Patient Details  Name: Cory Espinoza MRN: [id number] Date of Birth: 16-Feb-1971 Referring Provider (PT): Dr. Joselyn Arrow   Encounter Date: 10/14/2020   PT End of Session - 10/14/20 1605    Visit Number 21    Number of Visits 32    Date for PT Re-Evaluation 09/23/20    Authorization Type eval: 05/21/20    Authorization Time Period PT FOTO completed; FOTO performed 7/22 scored 40    PT Start Time 1605    PT Stop Time 9702    PT Time Calculation (min) 42 min    Equipment Utilized During Treatment Gait belt    Activity Tolerance Patient tolerated treatment well;No increased pain    Behavior During Therapy WFL for tasks assessed/performed           Past Medical History:  Diagnosis Date  . Allergy   . Anginal pain ([place])   . Anxiety   . Arthritis    lumbar spine  . Asthma   . Atrial fibrillation ([place])   . Back pain    Severe Lumbar Pain  . CHF (congestive heart failure) ([place])   . COPD (chronic obstructive pulmonary disease) ([facility])    Restrictive lung disease  . Coronary artery disease   . Dyspnea   . Dysrhythmia    afib  . GERD (gastroesophageal reflux disease)   . Headache    [name], onset October 2020, cluster headaches in the past  . History of kidney stones   . Hyperlipidemia   . Hypertension   . Myocardial infarction ([place])    at age 82  . Pneumonia   . Restrictive lung disease   . Sleep apnea    unable to use cpap since onset of migraines    Past Surgical History:  Procedure Laterality Date  . ABDOMINAL EXPOSURE N/A 03/25/2020   Procedure: ABDOMINAL EXPOSURE;  Surgeon: Rosetta Posner, MD;  Location: [facility] OR;  Service: Vascular;  Laterality: N/A;  . ANTERIOR CERVICAL DECOMP/DISCECTOMY FUSION N/A 01/23/2020   Procedure: ANTERIOR CERVICAL DECOMPRESSION FUSION - CERVICAL  SIX-CERVICAL SEVEN;  Surgeon: Earnie Larsson, MD;  Location: [place];  Service: Neurosurgery;  Laterality: N/A;  . ANTERIOR LUMBAR FUSION N/A 03/25/2020   Procedure: ANTERIOR LUMBAR INTERBODY FUSION LUMBAR FIVE-SACRAL ONE.;  Surgeon: Earnie Larsson, MD;  Location: [place];  Service: Neurosurgery;  Laterality: N/A;  anterior  . BACK SURGERY  1996  . CARDIAC CATHETERIZATION Left 04/27/2016   Procedure: Left Heart Cath and Coronary Angiography;  Surgeon: Corey Skains, MD;  Location: [facility];  Service: Cardiovascular;  Laterality: Left;  . CARDIAC CATHETERIZATION N/A 04/27/2016   Procedure: Intravascular Pressure Wire/FFR Study;  Surgeon: Yolonda Kida, MD;  Location: [place] [facility];  Service: Cardiovascular;  Laterality: N/A;  . CATHETERIZATION OF PULMONARY ARTERY WITH RETRIEVAL OF FOREIGN BODY Bilateral 04/07/2011   heart  . DEGENERATIVE SPONDYLOLISTHESIS    . KNEE ARTHROSCOPY Bilateral 2004  . [place] SURGERY  1999  . RADICULOPATHY, CERVICAL REGION    . TONSILLECTOMY      There were no vitals filed for this visit.   Subjective Assessment - 10/14/20 1629    Subjective Patient is doing alright today. He is complaining of 6/10 RLE pain upon arrival. No specific questions or concerns currently.    Pertinent History Pt has a  complex medical history. 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 He states that he fell 10-12 feet and struck his head, neck, and back. He was able to finish the course which took him approximately 45 minutes more to complete. He does endorse a second fall while finishing the course but did not suffer a second head injury. He states that he started getting headaches that day which have persisted since that time. 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 He does report improvement in his headaches recently. Neurology was concerned about possible BPPV  and have referred him for a vestibular evaluation. In addition since the injury, pt developed RUE and RLE pain and weakness. Pt states that NCV showed abnormal nerve conduction in RUE. He has since undergone a C7-7 ACDF on 01/23/20. He reports initial improvement in RUE strength, but states that he has a gradual return of his RUE weakness. He was also having significant RLE weakness and pain and underwent and L5-S1 anterior lumbar interbody fusion on 03/25/20. Patient reports that he does not have any back precautions but needs to wear the low back brace for one more month. Patient reports he has a follow-up appointment with the surgeon next month. Patient reports he has a follow-up appointment with Dr. Brigitte Pulse, neurologist, in August. He arrives to therapy ambulating with an upright rollator walker. Pt denies any mention of concussion after his injury. He has been having cognitive issues since his injury and has previously had a heavy metals screen as well as neurocognitive testing. He has had an MRI of his brain with and without contrast on 12/18/19. Results were mildly motion degraded examination. No evidence of acute intracranial abnormality. Minimal chronic small vessel ischemic disease. He has tremor in both his hands. He has a positive family history of Parkinson's disease in his grandfather. He is complaining of constant dizziness and unsteadiness which are worse with activity. Patient states that he frequently loses his balance and his wife has to steady him.    Limitations Lifting;Standing;Walking;House hold activities    Diagnostic tests He has had an MRI of his brain with and without contrast on 12/18/19. Results were mildly motion degraded examination. No evidence of acute intracranial abnormality. Minimal chronic small vessel ischemic disease.    Patient Stated Goals to be able to ambulate with a cane or no AD, to be able to drive, to be able to ride his motorcycle    Currently in Pain? Yes    Pain Score  6     Pain Location Leg    Pain Orientation Right    Pain Descriptors / Indicators Aching    Pain Type Chronic pain    Pain Onset More than a month ago    Pain Frequency Constant            TREATMENT   Ther-ex Octane xRide L3-5 x 5 minutes for warm up during history, 4 minutes unbilled; Precor single-leg press: L: 55# x 10, 60# x 10; RLE: 45# x 10, 55# x 10; Sit to stand without UE support with Airex pad under feet without UE support 2 x 10;  Standing with3# ankle weights BLE: BUE support used Hip abduction SLR x 20 reps BLE; Hip extension x  20 BLE; Hip flexion marches x 20 BLE; HS curls x 20 BLE;  Heel raises x 20reps with BUE support; Seated LAQ with  3# ankle weights x 20 BLE;   Neuromuscular Re-education All balance exercises performed with CGA and without UE support; NBOS eyes open/closed x 30s each; NBOS horizontal and vertical head turns x 30s each; Semitandem balance alternating forward LE x 30s each;   Pt educated throughout session about proper posture and technique with exercises. Improved exercise technique, movement at target joints, use of target muscles after min to mod verbal, visual, tactile cues.    Patient demonstrates excellent motivation during session.  Performed leg press during session today and patient is able to complete at higher weight. Significantly more strength demonstrated on the left lower extremity compared to the right side. Continued with standing an seated exercises as well as balance exercises. Patient encouraged to continue HEP and follow-up as scheduled. The patient would benefit from further skilled PT intervention to continue to progress towards goals.                               PT Short Term Goals - 06/27/20 0936      PT SHORT TERM GOAL #1   Title Patient will be independent in home exercise program to improve strength/mobility for better functional independence with ADLs and for  self-management.    Baseline Pt reported that he is consistent with HEP, independent    Time 4    Period Weeks    Status Achieved    Target Date 06/18/20             PT Long Term Goals - 09/30/20 1510      PT LONG TERM GOAL #1   Title Patient will have increased score of 10 or more points on the FOTO survey in order to improve mobility and functional independence with ADLs.    Baseline scored 41/100 on 05/21/2020; 7/22 40, 8/23: 60/100, 09/30/20=40    Time 8    Period Weeks    Status Achieved    Target Date 11/19/20      PT LONG TERM GOAL #2   Title Patient will reduce falls risk as indicated by decreased TUG time to less than 20 seconds.    Baseline scored 37.9 sec with TUG on 05/21/2020; 21seconds with elevated rollator 7/22, 8/23: 13.62 sec with up and go walker    Time 8    Period Weeks    Status Achieved    Target Date 11/19/20      PT LONG TERM GOAL #3   Title Patient will increase right UE and LE gross strength to 4+/5 throughout to improve functional strength for independent gait, increased standing tolerance and increased ADL ability.    Baseline grossly +3/5 to -4/5 right UE and LE strength on 05/21/2020; grossly 4-/5 for RUE, grossly 4-/5 for RLE on 7/22    Time 8    Period Weeks    Status On-going    Target Date 11/19/20      PT LONG TERM GOAL #4   Title Patient will improve self-selected gait speed to 0.7 m/s or greater on 10 m walk to indicate reduced falls risk, improved mobility and independence with ADL's.    Baseline on 7/22 .57 m/sself selected, fast  .73 m/s with LOB pt reported R knee buckling, elevated rollator used, 8/23: 1.06 m/s with up and go walker    Time 8    Period Weeks    Status Achieved      PT LONG TERM GOAL #5  Title Patient will increase Berg Balance score by > 6 points to demonstrate decreased fall risk during functional activities.    Baseline 8/23: 37/56, 09/30/20=40/56    Time 8    Period Weeks    Status Partially Met    Target  Date 11/19/20      PT LONG TERM GOAL #6   Title Patient (< 53 years old) will complete five times sit to stand test in < 10 seconds indicating an increased LE strength and improved balance.    Baseline 8/23: 22 sec, 09/30/20=13.04 sec    Time 8    Period Weeks    Status Partially Met    Target Date 11/19/20      PT LONG TERM GOAL #7   Title Patient will improve 6 min walk test >2000 feet with LRAD for improved gait ability in community.    Baseline 8/23: not tested; 8/30 845, goal adjusted >201f, 09/30/20=600 feet - stopped test due to R ankle catching making gait unsafe    Time 8    Period Weeks    Status On-going    Target Date 11/19/20                 Plan - 10/14/20 1631    Clinical Impression Statement Patient demonstrates excellent motivation during session.  Performed leg press during session today and patient is able to complete at higher weight. Significantly more strength demonstrated on the left lower extremity compared to the right side. Continued with standing an seated exercises as well as balance exercises. Patient encouraged to continue HEP and follow-up as scheduled. The patient would benefit from further skilled PT intervention to continue to progress towards goals.    Personal Factors and Comorbidities Comorbidity 3+;Time since onset of injury/illness/exacerbation    Comorbidities anxiety, back pain, COPD, headache, HTN, MI, sleep apnea, migraines    Examination-Activity Limitations Locomotion Level;Transfers;Bed Mobility;Stand;Stairs;Sleep;Squat;Bend    Examination-Participation Restrictions Driving;Medication Management    Stability/Clinical Decision Making Unstable/Unpredictable    Rehab Potential Fair    PT Frequency 1x / week    PT Duration 8 weeks    PT Treatment/Interventions ADLs/Self Care Home Management;Canalith Repostioning;Moist Heat;Electrical Stimulation;Gait training;Stair training;Functional mobility training;Therapeutic activities;Therapeutic  exercise;Balance training;Neuromuscular re-education;Patient/family education;Manual techniques;Passive range of motion;Vestibular    PT Next Visit Plan update HEP ea session for continued strengthening/balanceing at home    PT [place] Code: [email or web address] updated this session    Consulted and Agree with Plan of Care Patient           Patient will benefit from skilled therapeutic intervention in order to improve the following deficits and impairments:  Decreased activity tolerance, Decreased balance, Decreased cognition, Decreased mobility, Difficulty walking, Abnormal gait, Decreased range of motion, Dizziness, Pain, Impaired flexibility, Decreased strength, Impaired sensation  Visit Diagnosis: Unsteadiness on feet  Muscle weakness (generalized)     Problem List Patient Active Problem List   Diagnosis Date Noted  . Degenerative spondylolisthesis 03/25/2020  . Physical deconditioning 03/04/2020  . Lobar pneumonia, unspecified organism ([place] 03/04/2020  . Intractable hemiplegic migraine with status migrainosus 03/02/2020  . Current tobacco use 02/05/2020  . Abnormal findings on diagnostic imaging of lung 02/05/2020  . Shortness of breath 02/05/2020  . Herniated disc, cervical 01/23/2020  . Cervical spondylosis with myelopathy and radiculopathy 01/23/2020  . Pre-operative respiratory examination 01/12/2020  . Weakness on right side of face 12/22/2019  . Osteoarthritis of spine with radiculopathy, lumbar region 12/11/2019  . Intractable migraine with aura  with status migrainosus 11/09/2019  . Former smoker 08/27/2018  . Benign essential HTN 08/27/2018  . Cough productive of purulent sputum 11/08/2017  . GERD (gastroesophageal reflux disease) 05/06/2017  . Contusion of hand 04/22/2017  . BP (high blood pressure) 04/22/2017  . Oxygen desaturation 04/22/2017  . Prostate lump 04/22/2017  . Unstable angina ([place]) 04/27/2016  . Chest pain 04/24/2016  . Morbid  obesity ([place]) 01/29/2016  . Elevated ALT measurement 10/30/2015  . Nonspecific elevation of levels of transaminase and lactic acid dehydrogenase (ldh) 10/30/2015  . Reduced libido 10/29/2015  . Hyperlipidemia 08/06/2015  . Anxiety 08/06/2015  . Atherosclerosis of coronary artery 08/06/2015  . Childhood asthma 08/06/2015  . Chronic obstructive pulmonary disease ([place]) 08/06/2015  . OSA (obstructive sleep apnea) 08/06/2015  . Anxiety disorder 08/06/2015  . Atherosclerosis of native coronary artery of native heart with stable angina pectoris ([place]) 08/06/2015  . Uncomplicated asthma 36/68/1594   Phillips Grout PT, DPT, GCS  Edie Darley 10/14/2020, 5:26 PM  [place] MAIN [facility] SERVICES [address] [place], [place], [address] Phone: [phone]   Fax:  [phone]  Name: Cory Espinoza MRN: [id number] Date of Birth: Jul 13, 1971

## 2020-10-18 ENCOUNTER — Encounter: Payer: Self-pay | Admitting: Family

## 2020-10-18 ENCOUNTER — Ambulatory Visit: Payer: BC Managed Care – PPO | Admitting: Family

## 2020-10-18 ENCOUNTER — Other Ambulatory Visit: Payer: Self-pay

## 2020-10-18 VITALS — BP 120/80 | HR 75 | Ht 72.0 in | Wt 240.0 lb

## 2020-10-18 DIAGNOSIS — I10 Essential (primary) hypertension: Secondary | ICD-10-CM

## 2020-10-18 DIAGNOSIS — E785 Hyperlipidemia, unspecified: Secondary | ICD-10-CM

## 2020-10-18 DIAGNOSIS — I25118 Atherosclerotic heart disease of native coronary artery with other forms of angina pectoris: Secondary | ICD-10-CM

## 2020-10-18 NOTE — Patient Instructions (Signed)
Medication Instructions:  No medication changes today.   *If you need a refill on your cardiac medications before your next appointment, please call your pharmacy*   Lab Work: No lab work today.   Testing/Procedures: Your EKG today shows normal sinus rhythm. Which is a great result.   Follow-Up: At [facility], you and your health needs are our priority.  As part of our continuing mission to provide you with exceptional heart care, we have created designated Provider Care Teams.  These Care Teams include your primary Cardiologist (physician) and Advanced Practice Providers (APPs -  Physician Assistants and Nurse Practitioners) who all work together to provide you with the care you need, when you need it.  We recommend signing up for the patient portal called "MyChart".  Sign up information is provided on this After Visit Summary.  MyChart is used to connect with patients for Virtual Visits (Telemedicine).  Patients are able to view lab/test results, encounter notes, upcoming appointments, etc.  Non-urgent messages can be sent to your provider as well.   To learn more about what you can do with MyChart, go to [email or web address].    Your next appointment:   1 year(s)  The format for your next appointment:   In Person  Provider:   You may see Julien Nordmann, MD or one of the following Advanced Practice Providers on your designated Care Team:    Nicolasa Ducking, NP  Eula Listen, PA-C  Marisue Ivan, PA-C  Cadence Longton, [place]  Gillian Shields, NP

## 2020-10-18 NOTE — Progress Notes (Signed)
Office Visit    Patient Name: Cory Espinoza Date of Encounter: 10/18/2020  Primary Care Provider:  Jerl Mina, MD Primary Cardiologist:  Julien Nordmann, MD Electrophysiologist:  None   Chief Complaint    Cory Espinoza is a 49 y.o. male with a hx of COPD with previous tobacco use, anxiety, HLD, [name], HTN, OSA presents today for follow-up of [name]  Past Medical History    Past Medical History:  Diagnosis Date  . Allergy   . Anginal pain (HCC)   . Anxiety   . Arthritis    lumbar spine  . Asthma   . Atrial fibrillation (HCC)   . Back pain    Severe Lumbar Pain  . CHF (congestive heart failure) (HCC)   . COPD (chronic obstructive pulmonary disease) (HCC)    Restrictive lung disease  . Coronary artery disease   . Dyspnea   . Dysrhythmia    afib  . GERD (gastroesophageal reflux disease)   . Headache    [name], onset October 2020, cluster headaches in the past  . History of kidney stones   . Hyperlipidemia   . Hypertension   . Myocardial infarction (HCC)    at age 29  . Pneumonia   . Restrictive lung disease   . Sleep apnea    unable to use cpap since onset of migraines   Past Surgical History:  Procedure Laterality Date  . ABDOMINAL EXPOSURE N/A 03/25/2020   Procedure: ABDOMINAL EXPOSURE;  Surgeon: Larina Earthly, MD;  Location: [facility] OR;  Service: Vascular;  Laterality: N/A;  . ANTERIOR CERVICAL DECOMP/DISCECTOMY FUSION N/A 01/23/2020   Procedure: ANTERIOR CERVICAL DECOMPRESSION FUSION - CERVICAL SIX-CERVICAL SEVEN;  Surgeon: Julio Sicks, MD;  Location: [facility] OR;  Service: Neurosurgery;  Laterality: N/A;  . ANTERIOR LUMBAR FUSION N/A 03/25/2020   Procedure: ANTERIOR LUMBAR INTERBODY FUSION LUMBAR FIVE-SACRAL ONE.;  Surgeon: Julio Sicks, MD;  Location: [facility] OR;  Service: Neurosurgery;  Laterality: N/A;  anterior  . BACK SURGERY  1996  . CARDIAC CATHETERIZATION Left 04/27/2016   Procedure: Left Heart Cath and Coronary Angiography;  Surgeon: Lamar Blinks,  MD;  Location: [facility] INVASIVE CV LAB;  Service: Cardiovascular;  Laterality: Left;  . CARDIAC CATHETERIZATION N/A 04/27/2016   Procedure: Intravascular Pressure Wire/FFR Study;  Surgeon: Alwyn Pea, MD;  Location: [facility] INVASIVE CV LAB;  Service: Cardiovascular;  Laterality: N/A;  . CATHETERIZATION OF PULMONARY ARTERY WITH RETRIEVAL OF FOREIGN BODY Bilateral 04/07/2011   heart  . DEGENERATIVE SPONDYLOLISTHESIS    . KNEE ARTHROSCOPY Bilateral 2004  . LUMBAR DISC SURGERY  1999  . RADICULOPATHY, CERVICAL REGION    . TONSILLECTOMY      Allergies  Allergies  Allergen Reactions  . Aspirin Anaphylaxis  . Amoxicillin Hives and Itching    Has patient had a PCN reaction causing immediate rash, facial/tongue/throat swelling, SOB or lightheadedness with hypotension: No Has patient had a PCN reaction causing severe rash involving mucus membranes or skin necrosis: No Has patient had a PCN reaction that required hospitalization: No Has patient had a PCN reaction occurring within the last 10 years: No If all of the above answers are "NO", then may proceed with Cephalosporin use.   . Codeine Hives  . Penicillins Hives and Itching    Has patient had a PCN reaction causing immediate rash, facial/tongue/throat swelling, SOB or lightheadedness with hypotension: No Has patient had a PCN reaction causing severe rash involving mucus membranes or skin necrosis: No Has patient had a  PCN reaction that required hospitalization: No Has patient had a PCN reaction occurring within the last 10 years: No If all of the above answers are "NO", then may proceed with Cephalosporin use.   . Cefdinir Hives  . Hydrocodone-Acetaminophen Itching    History of Present Illness    Cory Espinoza is a 49 y.o. male with a hx of  COPD with previous tobacco use, anxiety, HLD, [name], HTN, OSA, BPPV, cervical spondylosis with myelopathy and radiculopathy s/p C6-7 ACDF surgery.  He was last seen via telemedicine on 09/13/2019 by  Dr. Mariah Milling.  Previous cardiac catheterization 04/25/2016 with ostial second Mrg lesion 75% and mid Cx 70% stenosed by initial study though after FFR ostial RPDA lesion 60%, distal RCA lesion 50%, negative FFR of AV groove lesion of OM1 and ostial lesion.  Recommended for medical management.  When last seen 09/2019 by Dr. Mariah Milling he was doing well.  No changes were made.  Since last seen he has had multiple neck surgeries.  He has upcoming follow up with neurology - previously followed with migraine with complex aura but now with concern for episodes which are epileptic in nature.   Presents today with his wife for follow-up.  They tell me that the last year has been very difficult due to his multiple surgeries, migraines, and now concern for seizures.  He reports no chest pain, pressure, tightness.  Reports no shortness of breath at rest nor dyspnea on exertion.  He has no concerns related to his heart.  He does not check his blood pressure routinely at home but denies lightheadedness, dizziness, near-syncope, syncope.  EKGs/Labs/Other Studies Reviewed:   The following studies were reviewed today:  EKG:  EKG is  ordered today.  The ekg ordered today demonstrates NSR 75 bpm with no acute ST/T wave changes.  Recent Labs: 02/06/2020: ALT 67; BNP 2.9 03/21/2020: BUN 14; Creatinine, Ser 0.88; Hemoglobin 16.0; Platelets 201; Potassium 4.1; Sodium 138  Recent Lipid Panel    Component Value Date/Time   CHOL 140 08/10/2017 0938   TRIG 65 08/10/2017 0938   [name] 39 (L) 08/10/2017 0938   CHOLHDL 3.4 05/01/2016 0908   LDLCALC 88 08/10/2017 0938    Home Medications   Current Meds  Medication Sig  . AIMOVIG 70 MG/ML SOAJ Inject 70 mg into the skin every 28 (twenty-eight) days.  Marland Kitchen albuterol (PROVENTIL HFA;VENTOLIN HFA) 108 (90 Base) MCG/ACT inhaler Inhale 1-2 puffs into the lungs every 4 (four) hours as needed for wheezing or shortness of breath.  . ALPRAZolam (XANAX) 1 MG tablet Take 1-2 mg by mouth See  admin instructions. Take 2 mg in the morning and 1 mg in the evening  . atorvastatin (LIPITOR) 40 MG tablet Take 1 tablet (40 mg total) by mouth at bedtime.  . B COMPLEX VITAMINS PO Take 1 tablet by mouth daily.   . baclofen (LIORESAL) 10 MG tablet Take 10 mg by mouth 2 (two) times daily as needed for muscle spasms.  . benzonatate (TESSALON) 200 MG capsule Take 1 capsule (200 mg total) by mouth 3 (three) times daily as needed for cough.  . cetirizine (ZYRTEC) 10 MG tablet TAKE 1 TABLET(10 MG) BY MOUTH DAILY  . Cholecalciferol (VITAMIN D) 125 MCG (5000 UT) CAPS Take 5,000 Units by mouth daily.  . clopidogrel (PLAVIX) 75 MG tablet Take 1 tablet (75 mg total) by mouth daily.  Marland Kitchen escitalopram (LEXAPRO) 20 MG tablet Take 20 mg by mouth at bedtime.   Marland Kitchen ipratropium-albuterol (DUONEB) 0.5-2.5 (  3) MG/3ML SOLN Take 3 mLs by nebulization every 4 (four) hours as needed. DX: J44.9 COPD  . magnesium oxide (MAG-OX) 400 MG tablet Take 400 mg by mouth daily.   . Melatonin 10 MG TABS Take 30 mg by mouth at bedtime.  . nitroGLYCERIN (NITROSTAT) 0.4 MG SL tablet Place 0.4 mg under the tongue every 5 (five) minutes as needed for chest pain.  Marland Kitchen NURTEC 75 MG TBDP Place 75 mg under the tongue daily as needed (migraine).   . pantoprazole (PROTONIX) 40 MG tablet TAKE 1 TABLET(40 MG) BY MOUTH DAILY  . Potassium 99 MG TABS Take 99 mg by mouth daily.   . pregabalin (LYRICA) 100 MG capsule Take 100 mg by mouth 3 (three) times daily.  . Probiotic Product (PROBIOTIC DAILY PO) Take 1 tablet by mouth daily.  . promethazine (PHENERGAN) 25 MG tablet Take 25 mg by mouth daily.   Marland Kitchen pyridOXINE (VITAMIN B-6) 100 MG tablet Take 100 mg by mouth daily.   . Saw Palmetto 450 MG CAPS Take 900 mg by mouth daily.   . Tiotropium Bromide Monohydrate (SPIRIVA RESPIMAT) 2.5 MCG/ACT AERS Inhale 2 puffs into the lungs daily.  Marland Kitchen zonisamide (ZONEGRAN) 100 MG capsule Take 400 mg by mouth at bedtime.      Review of Systems  All other systems  reviewed and are otherwise negative except as noted above.  Physical Exam    VS:  BP 120/80 (BP Location: Left Arm, Patient Position: Sitting, Cuff Size: Normal)   Pulse 75   Ht 6' (1.829 m)   Wt 240 lb (108.9 kg)   SpO2 97%   BMI 32.55 kg/m  , BMI Body mass index is 32.55 kg/m.  Wt Readings from Last 3 Encounters:  10/18/20 240 lb (108.9 kg)  03/25/20 202 lb (91.6 kg)  03/21/20 208 lb 3 oz (94.4 kg)    GEN: Well nourished, well developed, in no acute distress. HEENT: normal. Neck: Supple, no JVD, carotid bruits, or masses. Cardiac: RRR, no murmurs, rubs, or gallops. No clubbing, cyanosis, edema.  Radials/DP/PT 2+ and equal bilaterally.  Respiratory:  Respirations regular and unlabored, clear to auscultation bilaterally. GI: Soft, nontender, nondistended. MS: No deformity or atrophy. Skin: Warm and dry, no rash. Neuro:  Strength and sensation are intact. Psych: Normal affect.  Assessment & Plan    1. [name]-Stable with no anginal symptoms.  EKG today NSR with no acute ST/T wave changes.  No indication for ischemic evaluation at this time.  GDMT includes Plavix, statin.  No aspirin secondary to allergy.  2. HTN- BP well controlled. Continue current antihypertensive regimen.   3. HLD, LDL goal less than 70-02/2020 LDL 55.  Continue atorvastatin 40 g daily.  Disposition: Follow up in 1 year(s) with Dr. Mariah Milling or APP  Signed, Alver Sorrow, NP 10/18/2020, 2:30 PM [facility]

## 2020-10-21 ENCOUNTER — Ambulatory Visit: Payer: BC Managed Care – PPO

## 2020-10-21 ENCOUNTER — Other Ambulatory Visit: Payer: Self-pay

## 2020-10-21 DIAGNOSIS — R2681 Unsteadiness on feet: Secondary | ICD-10-CM | POA: Diagnosis not present

## 2020-10-21 DIAGNOSIS — M6281 Muscle weakness (generalized): Secondary | ICD-10-CM

## 2020-10-21 NOTE — Therapy (Signed)
[place] [facility] SERVICES [address] [place], [place], [address] Phone: [phone]   Fax:  [phone]  Physical Therapy Treatment  Patient Details  Name: Cory Espinoza MRN: [id number] Date of Birth: 09/11/1971 Referring Provider (PT): Dr. Joselyn Arrow   Encounter Date: 10/21/2020   PT End of Session - 10/21/20 1706    Visit Number 22    Number of Visits 32    Date for PT Re-Evaluation 09/23/20    Authorization Type eval: 05/21/20    Authorization Time Period PT FOTO completed; FOTO performed 7/22 scored 40    PT Start Time 1525    PT Stop Time 1600    PT Time Calculation (min) 35 min    Equipment Utilized During Treatment Gait belt    Activity Tolerance Patient tolerated treatment well;No increased pain    Behavior During Therapy WFL for tasks assessed/performed           Past Medical History:  Diagnosis Date  . Allergy   . Anginal pain ([place])   . Anxiety   . Arthritis    lumbar spine  . Asthma   . Atrial fibrillation ([place])   . Back pain    Severe Lumbar Pain  . CHF (congestive heart failure) ([place])   . COPD (chronic obstructive pulmonary disease) (HCC)    Restrictive lung disease  . Coronary artery disease   . Dyspnea   . Dysrhythmia    afib  . GERD (gastroesophageal reflux disease)   . Headache    [name], onset October 2020, cluster headaches in the past  . History of kidney stones   . Hyperlipidemia   . Hypertension   . Myocardial infarction ([place])    at age 21  . Pneumonia   . Restrictive lung disease   . Sleep apnea    unable to use cpap since onset of migraines    Past Surgical History:  Procedure Laterality Date  . ABDOMINAL EXPOSURE N/A 03/25/2020   Procedure: ABDOMINAL EXPOSURE;  Surgeon: Rosetta Posner, MD;  Location: [facility] OR;  Service: Vascular;  Laterality: N/A;  . ANTERIOR CERVICAL DECOMP/DISCECTOMY FUSION N/A 01/23/2020   Procedure: ANTERIOR CERVICAL DECOMPRESSION FUSION - CERVICAL  SIX-CERVICAL SEVEN;  Surgeon: Earnie Larsson, MD;  Location: [place];  Service: Neurosurgery;  Laterality: N/A;  . ANTERIOR LUMBAR FUSION N/A 03/25/2020   Procedure: ANTERIOR LUMBAR INTERBODY FUSION LUMBAR FIVE-SACRAL ONE.;  Surgeon: Earnie Larsson, MD;  Location: [place];  Service: Neurosurgery;  Laterality: N/A;  anterior  . BACK SURGERY  1996  . CARDIAC CATHETERIZATION Left 04/27/2016   Procedure: Left Heart Cath and Coronary Angiography;  Surgeon: Corey Skains, MD;  Location: [place] CV LAB;  Service: Cardiovascular;  Laterality: Left;  . CARDIAC CATHETERIZATION N/A 04/27/2016   Procedure: Intravascular Pressure Wire/FFR Study;  Surgeon: Yolonda Kida, MD;  Location: [place] CV LAB;  Service: Cardiovascular;  Laterality: N/A;  . CATHETERIZATION OF PULMONARY ARTERY WITH RETRIEVAL OF FOREIGN BODY Bilateral 04/07/2011   heart  . DEGENERATIVE SPONDYLOLISTHESIS    . KNEE ARTHROSCOPY Bilateral 2004  . [place] SURGERY  1999  . RADICULOPATHY, CERVICAL REGION    . TONSILLECTOMY      There were no vitals filed for this visit.   Subjective Assessment - 10/21/20 1705    Subjective Patient went to orthotic appointment today and is very pleased with his fitting. He reports he will get his new orthotic in 3 weeks.    Pertinent  History Pt has a complex medical history. 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 He states that he fell 10-12 feet and struck his head, neck, and back. He was able to finish the course which took him approximately 45 minutes more to complete. He does endorse a second fall while finishing the course but did not suffer a second head injury. He states that he started getting headaches that day which have persisted since that time. 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 He does report improvement in his headaches recently. Neurology was concerned about possible  BPPV and have referred him for a vestibular evaluation. In addition since the injury, pt developed RUE and RLE pain and weakness. Pt states that NCV showed abnormal nerve conduction in RUE. He has since undergone a C7-7 ACDF on 01/23/20. He reports initial improvement in RUE strength, but states that he has a gradual return of his RUE weakness. He was also having significant RLE weakness and pain and underwent and L5-S1 anterior lumbar interbody fusion on 03/25/20. Patient reports that he does not have any back precautions but needs to wear the low back brace for one more month. Patient reports he has a follow-up appointment with the surgeon next month. Patient reports he has a follow-up appointment with Dr. Brigitte Pulse, neurologist, in August. He arrives to therapy ambulating with an upright rollator walker. Pt denies any mention of concussion after his injury. He has been having cognitive issues since his injury and has previously had a heavy metals screen as well as neurocognitive testing. He has had an MRI of his brain with and without contrast on 12/18/19. Results were mildly motion degraded examination. No evidence of acute intracranial abnormality. Minimal chronic small vessel ischemic disease. He has tremor in both his hands. He has a positive family history of Parkinson's disease in his grandfather. He is complaining of constant dizziness and unsteadiness which are worse with activity. Patient states that he frequently loses his balance and his wife has to steady him.    Limitations Lifting;Standing;Walking;House hold activities    Diagnostic tests He has had an MRI of his brain with and without contrast on 12/18/19. Results were mildly motion degraded examination. No evidence of acute intracranial abnormality. Minimal chronic small vessel ischemic disease.    Patient Stated Goals to be able to ambulate with a cane or no AD, to be able to drive, to be able to ride his motorcycle    Currently in Pain? Yes    Pain  Score 7     Pain Location Leg    Pain Orientation Right;Left    Pain Descriptors / Indicators Aching    Pain Type Chronic pain    Pain Onset More than a month ago            TREATMENT   Ther-ex Precor single-leg press: L: 55# x 10, 60# x 10; RLE: 45# x 10, 55# x 10; Sit to stand without UE support with Airex pad under feet without UE support 2 x 10;  Standing with3# ankle weights BLE: BUE support used Hip abduction SLR x 20 reps BLE; Hip extension x  20 BLE; Hip flexion marches x 20 BLE; HS curls x 20 BLE;  Heel raises x 20reps with BUE support; Seated LAQ with 3# ankle weights x 20 BLE; Seated hip abd/add crossovers using semicircle foam with 3# x  20 reps each  Neuromuscular Re-education All balance exercises performed with CGA and without UE support; NBOS eyes open/closed x 30s each; NBOS horizontal and vertical head turns x 30s each;   Patient tolerated strengthening exercises with increased muscle fatigue with activity due to decreased tolerance to weight bearing exercises. Patient demonstrates increased supination of R ankle with single limb stance exercises with increased pain. Therapist provided tactile cues to block R ankle to bring stability to ankle. Therapist provided verbal cues to shift COG anteriorly to maintain balance with BOS with good demonstration. Therapy will continue to progress patient to improve BLE strength, decrease balance deficits, and increase functional activity tolerance.        PT Short Term Goals - 06/27/20 0936      PT SHORT TERM GOAL #1   Title Patient will be independent in home exercise program to improve strength/mobility for better functional independence with ADLs and for self-management.    Baseline Pt reported that he is consistent with HEP, independent    Time 4    Period Weeks    Status Achieved    Target Date 06/18/20             PT Long Term Goals - 09/30/20 1510      PT LONG TERM GOAL #1   Title Patient  will have increased score of 10 or more points on the FOTO survey in order to improve mobility and functional independence with ADLs.    Baseline scored 41/100 on 05/21/2020; 7/22 40, 8/23: 60/100, 09/30/20=40    Time 8    Period Weeks    Status Achieved    Target Date 11/19/20      PT LONG TERM GOAL #2   Title Patient will reduce falls risk as indicated by decreased TUG time to less than 20 seconds.    Baseline scored 37.9 sec with TUG on 05/21/2020; 21seconds with elevated rollator 7/22, 8/23: 13.62 sec with up and go walker    Time 8    Period Weeks    Status Achieved    Target Date 11/19/20      PT LONG TERM GOAL #3   Title Patient will increase right UE and LE gross strength to 4+/5 throughout to improve functional strength for independent gait, increased standing tolerance and increased ADL ability.    Baseline grossly +3/5 to -4/5 right UE and LE strength on 05/21/2020; grossly 4-/5 for RUE, grossly 4-/5 for RLE on 7/22    Time 8    Period Weeks    Status On-going    Target Date 11/19/20      PT LONG TERM GOAL #4   Title Patient will improve self-selected gait speed to 0.7 m/s or greater on 10 m walk to indicate reduced falls risk, improved mobility and independence with ADL's.    Baseline on 7/22 .57 m/sself selected, fast  .73 m/s with LOB pt reported R knee buckling, elevated rollator used, 8/23: 1.06 m/s with up and go walker    Time 8    Period Weeks    Status Achieved      PT LONG TERM GOAL #5   Title Patient will increase Berg Balance score by > 6 points to demonstrate decreased fall risk during functional activities.    Baseline 8/23: 37/56, 09/30/20=40/56    Time 8    Period Weeks    Status Partially Met    Target Date 11/19/20      PT LONG TERM GOAL #6  Title Patient (< 11 years old) will complete five times sit to stand test in < 10 seconds indicating an increased LE strength and improved balance.    Baseline 8/23: 22 sec, 09/30/20=13.04 sec    Time 8     Period Weeks    Status Partially Met    Target Date 11/19/20      PT LONG TERM GOAL #7   Title Patient will improve 6 min walk test >2000 feet with LRAD for improved gait ability in community.    Baseline 8/23: not tested; 8/30 845, goal adjusted >2016f, 09/30/20=600 feet - stopped test due to R ankle catching making gait unsafe    Time 8    Period Weeks    Status On-going    Target Date 11/19/20                 Plan - 10/21/20 1708    Clinical Impression Statement Patient tolerated strengthening exercises with increased muscle fatigue with activity due to decreased tolerance to weight bearing exercises. Patient demonstrates increased supination of R ankle with single limb stance exercises with increased pain. Therapist provided tactile cues to block R ankle to bring stability to ankle.  Therapist provided verbal cues to shift COG anteriorly to maintain balance with BOS with good demonstration. Therapy will continue to progress patient to improve BLE strength, decrease balance deficits, and increase functional activity tolerance.    Personal Factors and Comorbidities Comorbidity 3+;Time since onset of injury/illness/exacerbation    Comorbidities anxiety, back pain, COPD, headache, HTN, MI, sleep apnea, migraines    Examination-Activity Limitations Locomotion Level;Transfers;Bed Mobility;Stand;Stairs;Sleep;Squat;Bend    Examination-Participation Restrictions Driving;Medication Management    Stability/Clinical Decision Making Unstable/Unpredictable    Rehab Potential Fair    PT Frequency 1x / week    PT Duration 8 weeks    PT Treatment/Interventions ADLs/Self Care Home Management;[place] Repostioning;Moist Heat;Electrical Stimulation;Gait training;Stair training;Functional mobility training;Therapeutic activities;Therapeutic exercise;Balance training;Neuromuscular re-education;Patient/family education;Manual techniques;Passive range of motion;Vestibular    PT Next Visit Plan update  HEP ea session for continued strengthening/balanceing at home    PT [place] City[place] Code: [email or web address] updated this session    Consulted and Agree with Plan of Care Patient           Patient will benefit from skilled therapeutic intervention in order to improve the following deficits and impairments:  Decreased activity tolerance, Decreased balance, Decreased cognition, Decreased mobility, Difficulty walking, Abnormal gait, Decreased range of motion, Dizziness, Pain, Impaired flexibility, Decreased strength, Impaired sensation  Visit Diagnosis: Muscle weakness (generalized)  Unsteadiness on feet     Problem List Patient Active Problem List   Diagnosis Date Noted  . Degenerative spondylolisthesis 03/25/2020  . Physical deconditioning 03/04/2020  . Lobar pneumonia, unspecified organism ([place] 03/04/2020  . Intractable hemiplegic migraine with status migrainosus 03/02/2020  . Current tobacco use 02/05/2020  . Abnormal findings on diagnostic imaging of lung 02/05/2020  . Shortness of breath 02/05/2020  . Herniated disc, cervical 01/23/2020  . Cervical spondylosis with myelopathy and radiculopathy 01/23/2020  . Pre-operative respiratory examination 01/12/2020  . Weakness on right side of face 12/22/2019  . Osteoarthritis of spine with radiculopathy, lumbar region 12/11/2019  . Intractable migraine with aura with status migrainosus 11/09/2019  . Former smoker 08/27/2018  . Benign essential HTN 08/27/2018  . Cough productive of purulent sputum 11/08/2017  . GERD (gastroesophageal reflux disease) 05/06/2017  . Contusion of hand 04/22/2017  . BP (high blood pressure) 04/22/2017  . Oxygen desaturation 04/22/2017  .  Prostate lump 04/22/2017  . Unstable angina ([place]) 04/27/2016  . Chest pain 04/24/2016  . Morbid obesity ([place]) 01/29/2016  . Elevated ALT measurement 10/30/2015  . Nonspecific elevation of levels of transaminase and lactic acid dehydrogenase (ldh)  10/30/2015  . Reduced libido 10/29/2015  . Hyperlipidemia 08/06/2015  . Anxiety 08/06/2015  . Atherosclerosis of coronary artery 08/06/2015  . Childhood asthma 08/06/2015  . Chronic obstructive pulmonary disease ([place]) 08/06/2015  . OSA (obstructive sleep apnea) 08/06/2015  . Anxiety disorder 08/06/2015  . Atherosclerosis of native coronary artery of native heart with stable angina pectoris ([place]) 08/06/2015  . Uncomplicated asthma 56/31/4970   Karl Luke PT, DPT Netta Corrigan 10/21/2020, 6:06 PM  [place] [facility] SERVICES [address] [place], [place], [address] Phone: [phone]   Fax:  [phone]  Name: Cory Espinoza MRN: [id number] Date of Birth: [birth year]

## 2020-10-28 ENCOUNTER — Ambulatory Visit: Payer: BC Managed Care – PPO

## 2020-11-04 ENCOUNTER — Ambulatory Visit: Payer: BC Managed Care – PPO

## 2020-11-11 ENCOUNTER — Ambulatory Visit: Payer: BC Managed Care – PPO | Admitting: Adult Health

## 2020-11-11 ENCOUNTER — Ambulatory Visit: Payer: BC Managed Care – PPO | Attending: Neurology | Admitting: Physical Therapy

## 2020-11-11 ENCOUNTER — Encounter: Payer: Self-pay | Admitting: Adult Health

## 2020-11-11 ENCOUNTER — Other Ambulatory Visit: Payer: Self-pay

## 2020-11-11 ENCOUNTER — Telehealth: Payer: Self-pay | Admitting: Adult Health

## 2020-11-11 VITALS — BP 124/82 | HR 88 | Temp 97.8°F | Ht 72.0 in | Wt 242.6 lb

## 2020-11-11 DIAGNOSIS — R2681 Unsteadiness on feet: Secondary | ICD-10-CM

## 2020-11-11 DIAGNOSIS — G4733 Obstructive sleep apnea (adult) (pediatric): Secondary | ICD-10-CM | POA: Diagnosis not present

## 2020-11-11 DIAGNOSIS — R42 Dizziness and giddiness: Secondary | ICD-10-CM | POA: Insufficient documentation

## 2020-11-11 DIAGNOSIS — J449 Chronic obstructive pulmonary disease, unspecified: Secondary | ICD-10-CM | POA: Diagnosis not present

## 2020-11-11 DIAGNOSIS — M6281 Muscle weakness (generalized): Secondary | ICD-10-CM | POA: Diagnosis not present

## 2020-11-11 NOTE — Patient Instructions (Addendum)
Continue on Spiriva 2 puffs daily. Continue activity as tolerated Work on healthy weight loss Avoid sedating medications Do not drive if sleepy.  Refer to ENT for evaluation of INSPIRE device .  Follow-up with Dr. Belia Heman  in 6 months and as needed

## 2020-11-11 NOTE — Assessment & Plan Note (Signed)
Weight loss discussed  
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
## 2020-11-11 NOTE — Therapy (Signed)
[place] MAIN [facility] SERVICES [address] [place], [place], [address] Phone: [phone]   Fax:  [phone]  Physical Therapy Treatment  Patient Details  Name: Cory Espinoza MRN: [id number] Date of Birth: 1971/11/18 Referring Provider (PT): Dr. Joselyn Arrow   Encounter Date: 11/11/2020   PT End of Session - 11/11/20 1556    Visit Number 23    Number of Visits 32    Date for PT Re-Evaluation 11/19/20    Authorization Type eval: 05/21/20    Authorization Time Period PT FOTO completed; FOTO performed 7/22 scored 40    PT Start Time 1600    PT Stop Time 1645    PT Time Calculation (min) 45 min    Equipment Utilized During Treatment Gait belt    Activity Tolerance Patient tolerated treatment well;No increased pain    Behavior During Therapy WFL for tasks assessed/performed           Past Medical History:  Diagnosis Date  . Allergy   . Anginal pain ([place])   . Anxiety   . Arthritis    lumbar spine  . Asthma   . Atrial fibrillation ([place])   . Back pain    Severe Lumbar Pain  . CHF (congestive heart failure) ([place])   . COPD (chronic obstructive pulmonary disease) (H[facility])    Restrictive lung disease  . Coronary artery disease   . Dyspnea   . Dysrhythmia    afib  . GERD (gastroesophageal reflux disease)   . Headache    [name], onset October 2020, cluster headaches in the past  . History of kidney stones   . Hyperlipidemia   . Hypertension   . Myocardial infarction ([place])    at age 49  . Pneumonia   . Restrictive lung disease   . Sleep apnea    unable to use cpap since onset of migraines    Past Surgical History:  Procedure Laterality Date  . ABDOMINAL EXPOSURE N/A 03/25/2020   Procedure: ABDOMINAL EXPOSURE;  Surgeon: Rosetta Posner, MD;  Location: [facility] OR;  Service: Vascular;  Laterality: N/A;  . ANTERIOR CERVICAL DECOMP/DISCECTOMY FUSION N/A 01/23/2020   Procedure: ANTERIOR CERVICAL DECOMPRESSION FUSION - CERVICAL  SIX-CERVICAL SEVEN;  Surgeon: Earnie Larsson, MD;  Location: [place];  Service: Neurosurgery;  Laterality: N/A;  . ANTERIOR LUMBAR FUSION N/A 03/25/2020   Procedure: ANTERIOR LUMBAR INTERBODY FUSION LUMBAR FIVE-SACRAL ONE.;  Surgeon: Earnie Larsson, MD;  Location: [place];  Service: Neurosurgery;  Laterality: N/A;  anterior  . BACK SURGERY  1996  . CARDIAC CATHETERIZATION Left 04/27/2016   Procedure: Left Heart Cath and Coronary Angiography;  Surgeon: Corey Skains, MD;  Location: [place] CV LAB;  Service: Cardiovascular;  Laterality: Left;  . CARDIAC CATHETERIZATION N/A 04/27/2016   Procedure: Intravascular Pressure Wire/FFR Study;  Surgeon: Yolonda Kida, MD;  Location: [place] CV LAB;  Service: Cardiovascular;  Laterality: N/A;  . CATHETERIZATION OF PULMONARY ARTERY WITH RETRIEVAL OF FOREIGN BODY Bilateral 04/07/2011   heart  . DEGENERATIVE SPONDYLOLISTHESIS    . KNEE ARTHROSCOPY Bilateral 2004  . [place] SURGERY  1999  . RADICULOPATHY, CERVICAL REGION    . TONSILLECTOMY      There were no vitals filed for this visit.   Subjective Assessment - 11/11/20 1712    Subjective Patient reports he had a fall last week when he was getting out of the shower. Patient is unsure on how he fell; however,his wife states that  he mentioned after the fall that he was drying his hair with his head tilited backwards and the next thing he knew he was falling down. He had another fall yesterday while walking from one room to another and he is unsure on why he fell. His wife states he fell due to legs just giving out. He currently is walking in with his walking stick. He received his ankle orthotic and is very pleased with the results.    Pertinent History Pt has a complex medical history. 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 He states that he fell 10-12 feet and struck his head, neck, and back. He was able to finish the course which took him approximately 45  minutes more to complete. He does endorse a second fall while finishing the course but did not suffer a second head injury. He states that he started getting headaches that day which have persisted since that time. 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 He does report improvement in his headaches recently. Neurology was concerned about possible BPPV and have referred him for a vestibular evaluation. In addition since the injury, pt developed RUE and RLE pain and weakness. Pt states that NCV showed abnormal nerve conduction in RUE. He has since undergone a C7-7 ACDF on 01/23/20. He reports initial improvement in RUE strength, but states that he has a gradual return of his RUE weakness. He was also having significant RLE weakness and pain and underwent and L5-S1 anterior lumbar interbody fusion on 03/25/20. Patient reports that he does not have any back precautions but needs to wear the low back brace for one more month. Patient reports he has a follow-up appointment with the surgeon next month. Patient reports he has a follow-up appointment with Dr. Brigitte Pulse, neurologist, in August. He arrives to therapy ambulating with an upright rollator walker. Pt denies any mention of concussion after his injury. He has been having cognitive issues since his injury and has previously had a heavy metals screen as well as neurocognitive testing. He has had an MRI of his brain with and without contrast on 12/18/19. Results were mildly motion degraded examination. No evidence of acute intracranial abnormality. Minimal chronic small vessel ischemic disease. He has tremor in both his hands. He has a positive family history of Parkinson's disease in his grandfather. He is complaining of constant dizziness and unsteadiness which are worse with activity. Patient states that he frequently loses his balance and his wife has to steady him.    Limitations  Lifting;Standing;Walking;House hold activities    Diagnostic tests He has had an MRI of his brain with and without contrast on 12/18/19. Results were mildly motion degraded examination. No evidence of acute intracranial abnormality. Minimal chronic small vessel ischemic disease.    Patient Stated Goals to be able to ambulate with a cane or no AD, to be able to drive, to be able to ride his motorcycle    Pain Score 8     Pain Location Knee    Pain Orientation Right;Anterior;Posterior;Medial;Lateral    Pain Descriptors / Indicators Aching;Burning;Shooting;Sharp    Pain Type Acute pain    Pain Onset 1 to 4 weeks ago    Pain Frequency Constant           Ther-ex Precor single-leg press: L: 55# x 10, 60# x 10; RLE: 45# x6 (painful) Sit to stand  without use of BUE x 10, x 7, x 5 Seated hip abd/add crossovers using semicircle foam with 2.5# x 20 reps each Seated hip flexion with 2.5# x 20 reps each   Supine with 2.5# on BLE: Bridges x 30 reps SLR x 30 reps (RLE up to 30 degrees) Sidelying hip abduction x 20 each    Deferred weight bearing exercises today due to increased pain in R knee. PT examination revealed minimal swelling in medial R knee with tenderness to palpation along posterior knee joint line. Patient tolerated open chain exercises with minimal increase in pain in R knee. He demonstrates muscle weakness with strengthening hip exercises as evidenced by muscle fatigue with repetitions. Patient completed a 30 degree SLR on RLE due to increased stress on knee and discomfort with exercise. Therapy will continue to progress patient to improve BLE strength, decrease balance deficits, and increase functional activity tolerance.     PT Education - 11/11/20 1742    Education Details Patient educated on if knee swelling worsens, hot temperature, or pain levels increase in the next couple days then refer to doctor or ED.    Person(s) Educated Patient;Spouse    Methods Explanation;Verbal cues     Comprehension Verbalized understanding            PT Short Term Goals - 06/27/20 0936      PT SHORT TERM GOAL #1   Title Patient will be independent in home exercise program to improve strength/mobility for better functional independence with ADLs and for self-management.    Baseline Pt reported that he is consistent with HEP, independent    Time 4    Period Weeks    Status Achieved    Target Date 06/18/20             PT Long Term Goals - 09/30/20 1510      PT LONG TERM GOAL #1   Title Patient will have increased score of 10 or more points on the FOTO survey in order to improve mobility and functional independence with ADLs.    Baseline scored 41/100 on 05/21/2020; 7/22 40, 8/23: 60/100, 09/30/20=40    Time 8    Period Weeks    Status Achieved    Target Date 11/19/20      PT LONG TERM GOAL #2   Title Patient will reduce falls risk as indicated by decreased TUG time to less than 20 seconds.    Baseline scored 37.9 sec with TUG on 05/21/2020; 21seconds with elevated rollator 7/22, 8/23: 13.62 sec with up and go walker    Time 8    Period Weeks    Status Achieved    Target Date 11/19/20      PT LONG TERM GOAL #3   Title Patient will increase right UE and LE gross strength to 4+/5 throughout to improve functional strength for independent gait, increased standing tolerance and increased ADL ability.    Baseline grossly +3/5 to -4/5 right UE and LE strength on 05/21/2020; grossly 4-/5 for RUE, grossly 4-/5 for RLE on 7/22    Time 8    Period Weeks    Status On-going    Target Date 11/19/20      PT LONG TERM GOAL #4   Title Patient will improve self-selected gait speed to 0.7 m/s or greater on 10 m walk to indicate reduced falls risk, improved mobility and independence with ADL's.    Baseline on 7/22 .57 m/sself selected, fast  .73 m/s with LOB  pt reported R knee buckling, elevated rollator used, 8/23: 1.06 m/s with up and go walker    Time 8    Period Weeks    Status  Achieved      PT LONG TERM GOAL #5   Title Patient will increase Berg Balance score by > 6 points to demonstrate decreased fall risk during functional activities.    Baseline 8/23: 37/56, 09/30/20=40/56    Time 8    Period Weeks    Status Partially Met    Target Date 11/19/20      PT LONG TERM GOAL #6   Title Patient (< 22 years old) will complete five times sit to stand test in < 10 seconds indicating an increased LE strength and improved balance.    Baseline 8/23: 22 sec, 09/30/20=13.04 sec    Time 8    Period Weeks    Status Partially Met    Target Date 11/19/20      PT LONG TERM GOAL #7   Title Patient will improve 6 min walk test >2000 feet with LRAD for improved gait ability in community.    Baseline 8/23: not tested; 8/30 845, goal adjusted >2021f, 09/30/20=600 feet - stopped test due to R ankle catching making gait unsafe    Time 8    Period Weeks    Status On-going    Target Date 11/19/20                 Plan - 11/11/20 1740    Clinical Impression Statement Deferred weight bearing exercises today due to increased pain in R knee. PT examination revealed minimal swelling in medial R knee with tenderness to palpation along posterior knee joint line. Patient tolerated open chain exercises with minimal increase in pain in R knee. He demonstrates muscle weakness with strengthening hip exercises as evidenced by muscle fatigue with repetitions. Patient completed a 30 degree SLR on RLE due to increased stress on knee and discomfort with exercise. Therapy will continue to progress patient to improve BLE strength, decrease balance deficits, and increase functional activity tolerance.    Personal Factors and Comorbidities Comorbidity 3+;Time since onset of injury/illness/exacerbation    Comorbidities anxiety, back pain, COPD, headache, HTN, MI, sleep apnea, migraines    Examination-Activity Limitations Locomotion Level;Transfers;Bed Mobility;Stand;Stairs;Sleep;Squat;Bend     Examination-Participation Restrictions Driving;Medication Management    Stability/Clinical Decision Making Unstable/Unpredictable    Rehab Potential Fair    PT Frequency 1x / week    PT Duration 8 weeks    PT Treatment/Interventions ADLs/Self Care Home Management;Canalith Repostioning;Moist Heat;Electrical Stimulation;Gait training;Stair training;Functional mobility training;Therapeutic activities;Therapeutic exercise;Balance training;Neuromuscular re-education;Patient/family education;Manual techniques;Passive range of motion;Vestibular    PT Next Visit Plan update HEP ea session for continued strengthening/balanceing at home    PT H[place]Access Code: [email or web address] updated this session    Consulted and Agree with Plan of Care Patient           Patient will benefit from skilled therapeutic intervention in order to improve the following deficits and impairments:  Decreased activity tolerance, Decreased balance, Decreased cognition, Decreased mobility, Difficulty walking, Abnormal gait, Decreased range of motion, Dizziness, Pain, Impaired flexibility, Decreased strength, Impaired sensation  Visit Diagnosis: Muscle weakness (generalized)  Unsteadiness on feet     Problem List Patient Active Problem List   Diagnosis Date Noted  . Degenerative spondylolisthesis 03/25/2020  . Physical deconditioning 03/04/2020  . Lobar pneumonia, unspecified organism ([place] 03/04/2020  . Intractable hemiplegic migraine with status migrainosus 03/02/2020  .  Current tobacco use 02/05/2020  . Abnormal findings on diagnostic imaging of lung 02/05/2020  . Shortness of breath 02/05/2020  . Herniated disc, cervical 01/23/2020  . Cervical spondylosis with myelopathy and radiculopathy 01/23/2020  . Pre-operative respiratory examination 01/12/2020  . Weakness on right side of face 12/22/2019  . Osteoarthritis of spine with radiculopathy, lumbar region 12/11/2019  . Intractable migraine with  aura with status migrainosus 11/09/2019  . Former smoker 08/27/2018  . Benign essential HTN 08/27/2018  . Cough productive of purulent sputum 11/08/2017  . GERD (gastroesophageal reflux disease) 05/06/2017  . Contusion of hand 04/22/2017  . BP (high blood pressure) 04/22/2017  . Oxygen desaturation 04/22/2017  . Prostate lump 04/22/2017  . Unstable angina ([place]) 04/27/2016  . Chest pain 04/24/2016  . Morbid obesity ([place]) 01/29/2016  . Elevated ALT measurement 10/30/2015  . Nonspecific elevation of levels of transaminase and lactic acid dehydrogenase (ldh) 10/30/2015  . Reduced libido 10/29/2015  . Hyperlipidemia 08/06/2015  . Anxiety 08/06/2015  . Atherosclerosis of coronary artery 08/06/2015  . Childhood asthma 08/06/2015  . Chronic obstructive pulmonary disease ([place]) 08/06/2015  . OSA (obstructive sleep apnea) 08/06/2015  . Anxiety disorder 08/06/2015  . Atherosclerosis of native coronary artery of native heart with stable angina pectoris ([place]) 08/06/2015  . Uncomplicated asthma 48/30/3220   Karl Luke PT, DPT Netta Corrigan 11/11/2020, 5:45 PM  [place] MAIN [facility] SERVICES [address] [place], [place], [address] Phone: [phone]   Fax:  [phone]  Name: CARRY ORTEZ MRN: [id number] Date of Birth: [birth year]

## 2020-11-11 NOTE — Telephone Encounter (Signed)
Per TP verbally- will need to obtain sleep study. Patient stated that sleep study was preformed at [facility].   Spoke to [place] with [facility] and was advised that patient can not be located in system,   Lm with [facility] to see if [atient had sleep study there.  Will await call back.

## 2020-11-11 NOTE — Assessment & Plan Note (Signed)
Obstructive sleep apnea previously well controlled on CPAP however since accident in August 2020 has been unable to wear CPAP due to chronic severe debilitating migraines.  Has tried multiple different mask without any improvement. We will obtain sleep study results.  Refer to ENT for consideration of inspire device.  Plan Patient Instructions  Continue on Spiriva 2 puffs daily. Continue activity as tolerated Work on healthy weight loss Avoid sedating medications Do not drive if sleepy.  Refer to ENT for evaluation of INSPIRE device .  Follow-up with Dr. Belia Heman  in 6 months and as needed

## 2020-11-11 NOTE — Assessment & Plan Note (Signed)
Compensated on present maintenance regimen.  No changes  Plan  Patient Instructions  Continue on Spiriva 2 puffs daily. Continue activity as tolerated Work on healthy weight loss Avoid sedating medications Do not drive if sleepy.  Refer to ENT for evaluation of INSPIRE device .  Follow-up with Dr. Belia Heman  in 6 months and as needed

## 2020-11-14 NOTE — Telephone Encounter (Signed)
Patient stated that he located sleep study from 2013. He will bring it by our office tomorrow.

## 2020-11-14 NOTE — Telephone Encounter (Signed)
I have spoken to Cory Espinoza with [facility], who stated that patient did not have sleep study with them.  Spoke to patient, who stated that he in fact had sleep study at [facility] around 2012.   Lm for Cory Espinoza with [facility] to make her aware of this information.

## 2020-11-15 NOTE — Telephone Encounter (Signed)
Pt dropped off sleep study results. Placed in APP folder.//TM

## 2020-11-15 NOTE — Telephone Encounter (Signed)
Sleep study has been faxed to TP for review and given to Synetta Fail to fax to ENT.  Placed sleep study up front for pick up. Patient will pick up next week.  Nothing further needed.

## 2020-11-18 ENCOUNTER — Ambulatory Visit: Payer: BC Managed Care – PPO

## 2020-11-25 ENCOUNTER — Ambulatory Visit: Payer: BC Managed Care – PPO | Admitting: Physical Therapy

## 2020-11-25 ENCOUNTER — Other Ambulatory Visit: Payer: Self-pay

## 2020-11-25 DIAGNOSIS — R2681 Unsteadiness on feet: Secondary | ICD-10-CM

## 2020-11-25 DIAGNOSIS — M6281 Muscle weakness (generalized): Secondary | ICD-10-CM | POA: Diagnosis not present

## 2020-11-25 DIAGNOSIS — R42 Dizziness and giddiness: Secondary | ICD-10-CM

## 2020-11-25 NOTE — Therapy (Signed)
[place] MAIN [facility] SERVICES [address] [place], [place], [address] Phone: [phone]   Fax:  [phone]  Physical Therapy Treatment/Recertification  Patient Details  Name: Cory Espinoza MRN: [id number] Date of Birth: 03-05-1971 Referring Provider (PT): Dr. Joselyn Arrow   Encounter Date: 11/25/2020   PT End of Session - 11/25/20 1344    Visit Number 24    Number of Visits 32    Date for PT Re-Evaluation 01/20/21    Authorization Type eval: 05/21/20    Authorization Time Period PT FOTO completed; FOTO performed 7/22 scored 40    PT Start Time 0145    PT Stop Time 0230    PT Time Calculation (min) 45 min    Equipment Utilized During Treatment Gait belt    Activity Tolerance Patient tolerated treatment well;No increased pain    Behavior During Therapy WFL for tasks assessed/performed           Past Medical History:  Diagnosis Date  . Allergy   . Anginal pain ([facility])   . Anxiety   . Arthritis    lumbar spine  . Asthma   . Atrial fibrillation ([place])   . Back pain    Severe Lumbar Pain  . CHF (congestive heart failure) ([place])   . COPD (chronic obstructive pulmonary disease) ([facility])    Restrictive lung disease  . Coronary artery disease   . Dyspnea   . Dysrhythmia    afib  . GERD (gastroesophageal reflux disease)   . Headache    [name], onset October 2020, cluster headaches in the past  . History of kidney stones   . Hyperlipidemia   . Hypertension   . Myocardial infarction ([place])    at age 60  . Pneumonia   . Restrictive lung disease   . Sleep apnea    unable to use cpap since onset of migraines    Past Surgical History:  Procedure Laterality Date  . ABDOMINAL EXPOSURE N/A 03/25/2020   Procedure: ABDOMINAL EXPOSURE;  Surgeon: Rosetta Posner, MD;  Location: [facility] OR;  Service: Vascular;  Laterality: N/A;  . ANTERIOR CERVICAL DECOMP/DISCECTOMY FUSION N/A 01/23/2020   Procedure: ANTERIOR CERVICAL DECOMPRESSION FUSION -  CERVICAL SIX-CERVICAL SEVEN;  Surgeon: Earnie Larsson, MD;  Location: [facility];  Service: Neurosurgery;  Laterality: N/A;  . ANTERIOR LUMBAR FUSION N/A 03/25/2020   Procedure: ANTERIOR LUMBAR INTERBODY FUSION LUMBAR FIVE-SACRAL ONE.;  Surgeon: Earnie Larsson, MD;  Location: [facility];  Service: Neurosurgery;  Laterality: N/A;  anterior  . BACK SURGERY  1996  . CARDIAC CATHETERIZATION Left 04/27/2016   Procedure: Left Heart Cath and Coronary Angiography;  Surgeon: Corey Skains, MD;  Location: [facility];  Service: Cardiovascular;  Laterality: Left;  . CARDIAC CATHETERIZATION N/A 04/27/2016   Procedure: Intravascular Pressure Wire/FFR Study;  Surgeon: Yolonda Kida, MD;  Location: [facility];  Service: Cardiovascular;  Laterality: N/A;  . CATHETERIZATION OF PULMONARY ARTERY WITH RETRIEVAL OF FOREIGN BODY Bilateral 04/07/2011   heart  . DEGENERATIVE SPONDYLOLISTHESIS    . KNEE ARTHROSCOPY Bilateral 2004  . [place] SURGERY  1999  . RADICULOPATHY, CERVICAL REGION    . TONSILLECTOMY      There were no vitals filed for this visit.   Subjective Assessment - 11/25/20 1529    Subjective Patient reports that he had a fall when he was going up the stairs and his right knee has been hurting since then. He states he feels demotivated  with the progress he is making with his health status and physical activity level. Patient's wife reports of having a change of medication due to frequent migraines and is now on a seizure medication.    Patient is accompained by: Family member    Pertinent History Pt has a complex medical history. 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 He states that he fell 10-12 feet and struck his head, neck, and back. He was able to finish the course which took him approximately 45 minutes more to complete. He does endorse a second fall while finishing the course but did not suffer a second head injury. He states that he started  getting headaches that day which have persisted since that time. 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 He does report improvement in his headaches recently. Neurology was concerned about possible BPPV and have referred him for a vestibular evaluation. In addition since the injury, pt developed RUE and RLE pain and weakness. Pt states that NCV showed abnormal nerve conduction in RUE. He has since undergone a C7-7 ACDF on 01/23/20. He reports initial improvement in RUE strength, but states that he has a gradual return of his RUE weakness. He was also having significant RLE weakness and pain and underwent and L5-S1 anterior lumbar interbody fusion on 03/25/20. Patient reports that he does not have any back precautions but needs to wear the low back brace for one more month. Patient reports he has a follow-up appointment with the surgeon next month. Patient reports he has a follow-up appointment with Dr. Brigitte Pulse, neurologist, in August. He arrives to therapy ambulating with an upright rollator walker. Pt denies any mention of concussion after his injury. He has been having cognitive issues since his injury and has previously had a heavy metals screen as well as neurocognitive testing. He has had an MRI of his brain with and without contrast on 12/18/19. Results were mildly motion degraded examination. No evidence of acute intracranial abnormality. Minimal chronic small vessel ischemic disease. He has tremor in both his hands. He has a positive family history of Parkinson's disease in his grandfather. He is complaining of constant dizziness and unsteadiness which are worse with activity. Patient states that he frequently loses his balance and his wife has to steady him.    Limitations Lifting;Standing;Walking;House hold activities    Diagnostic tests He has had an MRI of his brain with and without contrast on 12/18/19. Results were mildly motion  degraded examination. No evidence of acute intracranial abnormality. Minimal chronic small vessel ischemic disease.    Patient Stated Goals to be able to ambulate with a cane or no AD, to be able to drive, to be able to ride his motorcycle    Pain Onset 1 to 4 weeks ago              [facility] PT Assessment - 11/25/20 1351      6 Minute Walk- Baseline   6 Minute Walk- Baseline yes    BP (mmHg) 126/80    HR (bpm) 83    02 Sat (%RA) 97 %      Berg Balance Test   Sit to Stand Able to stand without using hands and stabilize independently    Standing Unsupported Able to stand safely 2 minutes    Sitting with Back Unsupported but Feet Supported on Floor or Stool Able  to sit safely and securely 2 minutes    Stand to Sit Sits safely with minimal use of hands    Transfers Able to transfer safely, minor use of hands    Standing Unsupported with Eyes Closed Able to stand 10 seconds with supervision    Standing Unsupported with Feet Together Able to place feet together independently and stand for 1 minute with supervision    From Standing, Reach Forward with Outstretched Arm Can reach confidently >25 cm (10")    From Standing Position, Pick up Object from Floor Unable to pick up shoe, but reaches 2-5 cm (1-2") from shoe and balances independently    From Standing Position, Turn to Look Behind Over each Shoulder Looks behind from both sides and weight shifts well    Turn 360 Degrees Needs close supervision or verbal cueing    Standing Unsupported, Alternately Place Feet on Step/Stool Able to stand independently and complete 8 steps >20 seconds    Standing Unsupported, One Foot in Front Needs help to step but can hold 15 seconds    Standing on One Leg Tries to lift leg/unable to hold 3 seconds but remains standing independently    Total Score 42              Recertification was completed today. Reassessment of goals and outcome measurements were updated in today's therapy session. Patient  demonstrates lethargy and decreased functional activity tolerance with outcome measures. Patient was unable to complete 6MWT due to severe dizziness, sweating, and vomiting/nausea. Patient has deficits in strength, balance, and gait due to weakness. Therapist provided patient education on activity pacing and self monitoring with health condition in order to be self-aware when it is appropriate to attend therapy session. Therapist provided patient education to patient's wife about resources for mental health. Patient's wife states that they are going to be talking to a psychiatrist at the end of the month. Patient states he will be going to his PCP later this week to get his right knee evaluated. Patient's condition has the potential to improve in response to therapy. Maximum improvement is yet to be obtained. The anticipated improvement is attainable and reasonable in a generally predictable time.                        PT Short Term Goals - 06/27/20 0936      PT SHORT TERM GOAL #1   Title Patient will be independent in home exercise program to improve strength/mobility for better functional independence with ADLs and for self-management.    Baseline Pt reported that he is consistent with HEP, independent    Time 4    Period Weeks    Status Achieved    Target Date 06/18/20             PT Long Term Goals - 11/25/20 1358      PT LONG TERM GOAL #1   Title Patient will have increased score of 10 or more points on the FOTO survey in order to improve mobility and functional independence with ADLs.    Baseline scored 41/100 on 05/21/2020; 7/22 40, 8/23: 60/100, 09/30/20=40    Time 8    Period Weeks    Status Achieved      PT LONG TERM GOAL #2   Title Patient will reduce falls risk as indicated by decreased TUG time to less than 20 seconds.    Baseline scored 37.9 sec with TUG on 05/21/2020; 21seconds with  elevated rollator 7/22, 8/23: 13.62 sec with up and go walker    Time 8     Period Weeks    Status Achieved      PT LONG TERM GOAL #3   Title Patient will increase right UE and LE gross strength to 4+/5 throughout to improve functional strength for independent gait, increased standing tolerance and increased ADL ability.    Baseline grossly +3/5 to -4/5 right UE and LE strength on 05/21/2020; grossly 4-/5 for RUE, grossly 4-/5 for RLE on 7/22, grossly 4-/5 RUE, grossly 4/5 RLE    Time 8    Period Weeks    Status On-going    Target Date 01/20/21      PT LONG TERM GOAL #4   Title Patient will improve self-selected gait speed to 0.7 m/s or greater on 10 m walk to indicate reduced falls risk, improved mobility and independence with ADL's.    Baseline on 7/22 .57 m/sself selected, fast  .73 m/s with LOB pt reported R knee buckling, elevated rollator used, 8/23: 1.06 m/s with up and go walker    Time 8    Period Weeks    Status Achieved      PT LONG TERM GOAL #5   Title Patient will increase Berg Balance score by > 6 points to demonstrate decreased fall risk during functional activities.    Baseline 8/23: 37/56, 09/30/20=40/56, 12/20: 42/56    Time 8    Period Weeks    Status Partially Met    Target Date 01/20/21      PT LONG TERM GOAL #6   Title Patient (< 11 years old) will complete five times sit to stand test in < 10 seconds indicating an increased LE strength and improved balance.    Baseline 8/23: 22 sec, 09/30/20=13.04 sec, 12/20: 27.05 sec    Time 8    Period Weeks    Status Partially Met    Target Date 01/20/21      PT LONG TERM GOAL #7   Title Patient will improve 6 min walk test >2000 feet with LRAD for improved gait ability in community.    Baseline 8/23: not tested; 8/30 845, goal adjusted >2060f, 09/30/20=600 feet - stopped test due to R ankle catching making gait unsafe, 11/25/20= 330 feet - stopped test due to severe dizziness, vomiting/nausea, sweating making gait unsafe    Time 8    Period Weeks    Status On-going    Target Date  01/20/21                 Plan - 11/25/20 1546    Clinical Impression Statement Recertification was completed today. Reassessment of goals and outcome measurements were updated in today's therapy session. Patient demonstrates lethargy and decreased functional activity tolerance with outcome measures. Patient was unable to complete 6MWT due to severe dizziness, sweating, and vomiting/nausea. Patient has deficits in strength, balance, and gait due to weakness. Therapist provided patient education on activity pacing and self monitoring with health condition in order to be self-aware when it is appropriate to attend therapy session. Therapist provided patient education to patient's wife about resources for mental health. Patient's wife states that they are going to be talking to a psychiatrist at the end of the month. Patient states he will be going to his PCP later this week to get his right knee evaluated. Patient's condition has the potential to improve in response to therapy. Maximum improvement is yet to be obtained. The  anticipated improvement is attainable and reasonable in a generally predictable time.    Personal Factors and Comorbidities Comorbidity 3+;Time since onset of injury/illness/exacerbation    Comorbidities anxiety, back pain, COPD, headache, HTN, MI, sleep apnea, migraines    Examination-Activity Limitations Locomotion Level;Transfers;Bed Mobility;Stand;Stairs;Sleep;Squat;Bend    Examination-Participation Restrictions Driving;Medication Management    Stability/Clinical Decision Making Unstable/Unpredictable    Rehab Potential Fair    PT Frequency 1x / week    PT Duration 8 weeks    PT Treatment/Interventions ADLs/Self Care Home Management;Canalith Repostioning;Moist Heat;Electrical Stimulation;Gait training;Stair training;Functional mobility training;Therapeutic activities;Therapeutic exercise;Balance training;Neuromuscular re-education;Patient/family education;Manual  techniques;Passive range of motion;Vestibular    PT Next Visit Plan update HEP ea session for continued strengthening/balanceing at home    PT [place] Access Code: [email or web address] updated this session    Consulted and Agree with Plan of Care Patient           Patient will benefit from skilled therapeutic intervention in order to improve the following deficits and impairments:  Decreased activity tolerance,Decreased balance,Decreased cognition,Decreased mobility,Difficulty walking,Abnormal gait,Decreased range of motion,Dizziness,Pain,Impaired flexibility,Decreased strength,Impaired sensation  Visit Diagnosis: Muscle weakness (generalized)  Unsteadiness on feet  Dizziness and giddiness     Problem List Patient Active Problem List   Diagnosis Date Noted  . Degenerative spondylolisthesis 03/25/2020  . Physical deconditioning 03/04/2020  . Lobar pneumonia, unspecified organism ([place]) 03/04/2020  . Intractable hemiplegic migraine with status migrainosus 03/02/2020  . Current tobacco use 02/05/2020  . Abnormal findings on diagnostic imaging of lung 02/05/2020  . Shortness of breath 02/05/2020  . Herniated disc, cervical 01/23/2020  . Cervical spondylosis with myelopathy and radiculopathy 01/23/2020  . Pre-operative respiratory examination 01/12/2020  . Weakness on right side of face 12/22/2019  . Osteoarthritis of spine with radiculopathy, lumbar region 12/11/2019  . Intractable migraine with aura with status migrainosus 11/09/2019  . Former smoker 08/27/2018  . Benign essential HTN 08/27/2018  . Cough productive of purulent sputum 11/08/2017  . GERD (gastroesophageal reflux disease) 05/06/2017  . Contusion of hand 04/22/2017  . BP (high blood pressure) 04/22/2017  . Oxygen desaturation 04/22/2017  . Prostate lump 04/22/2017  . Unstable angina ([place]) 04/27/2016  . Chest pain 04/24/2016  . Morbid obesity ([place]) 01/29/2016  . Elevated ALT measurement 10/30/2015  .  Nonspecific elevation of levels of transaminase and lactic acid dehydrogenase (ldh) 10/30/2015  . Reduced libido 10/29/2015  . Hyperlipidemia 08/06/2015  . Anxiety 08/06/2015  . Atherosclerosis of coronary artery 08/06/2015  . Childhood asthma 08/06/2015  . Chronic obstructive pulmonary disease ([place]) 08/06/2015  . OSA (obstructive sleep apnea) 08/06/2015  . Anxiety disorder 08/06/2015  . Atherosclerosis of native coronary artery of native heart with stable angina pectoris ([place]) 08/06/2015  . Uncomplicated asthma 33/54/5625   Karl Luke PT, DPT Netta Corrigan 11/25/2020, 3:52 PM  [place] MAIN [facility] SERVICES [address] [place], [place], [address] Phone: [phone]   Fax:  [phone]  Name: Cory Espinoza MRN: [id number] Date of Birth: 06-13-1971

## 2020-12-02 ENCOUNTER — Ambulatory Visit: Payer: BC Managed Care – PPO

## 2020-12-09 ENCOUNTER — Ambulatory Visit: Payer: BC Managed Care – PPO

## 2020-12-09 ENCOUNTER — Other Ambulatory Visit: Payer: Self-pay | Admitting: Orthopedic Surgery

## 2020-12-09 DIAGNOSIS — M25561 Pain in right knee: Secondary | ICD-10-CM

## 2020-12-09 DIAGNOSIS — M2341 Loose body in knee, right knee: Secondary | ICD-10-CM

## 2020-12-16 ENCOUNTER — Ambulatory Visit: Payer: BC Managed Care – PPO

## 2020-12-17 ENCOUNTER — Ambulatory Visit
Admission: RE | Admit: 2020-12-17 | Discharge: 2020-12-17 | Disposition: A | Discharge status: Home / Self Care | Payer: BC Managed Care – PPO | Source: Ambulatory Visit | Attending: Orthopedic Surgery | Admitting: Orthopedic Surgery

## 2020-12-17 ENCOUNTER — Other Ambulatory Visit: Payer: Self-pay

## 2020-12-17 DIAGNOSIS — M25561 Pain in right knee: Secondary | ICD-10-CM | POA: Diagnosis not present

## 2020-12-17 DIAGNOSIS — M2341 Loose body in knee, right knee: Secondary | ICD-10-CM

## 2020-12-23 ENCOUNTER — Ambulatory Visit: Payer: BC Managed Care – PPO

## 2020-12-30 ENCOUNTER — Ambulatory Visit: Payer: BC Managed Care – PPO

## 2021-01-06 ENCOUNTER — Ambulatory Visit: Payer: BC Managed Care – PPO

## 2021-01-07 ENCOUNTER — Telehealth: Payer: Self-pay | Admitting: Adult Health

## 2021-01-07 DIAGNOSIS — G4733 Obstructive sleep apnea (adult) (pediatric): Secondary | ICD-10-CM

## 2021-01-07 NOTE — Telephone Encounter (Signed)
That is fine , order Home sleep study . (make sure HST is okay )

## 2021-01-07 NOTE — Telephone Encounter (Signed)
Spoke to patient, who stated that Dr. Annalee Genta is requesting that Cory Espinoza order a sleep study.  Patient stated that he is in the process of getting the inspire device and a new study is needed.   Cory Espinoza, please advise. thanks

## 2021-01-07 NOTE — Telephone Encounter (Signed)
Patient is not sure if HST would be acceptable.  ATC Dr. Clovis Pu office to confirm. Their office is currently closed.  Will call back on 01/08/2021

## 2021-01-08 NOTE — Telephone Encounter (Signed)
Spoke to [facility] with [facility], who stated that she would confirm with Dr. Annalee Genta. Roe Coombs will call back with update.

## 2021-01-09 ENCOUNTER — Other Ambulatory Visit: Payer: Self-pay

## 2021-01-09 ENCOUNTER — Ambulatory Visit: Payer: BC Managed Care – PPO | Attending: Neurology | Admitting: Physical Therapy

## 2021-01-09 DIAGNOSIS — R42 Dizziness and giddiness: Secondary | ICD-10-CM | POA: Insufficient documentation

## 2021-01-09 DIAGNOSIS — M6281 Muscle weakness (generalized): Secondary | ICD-10-CM

## 2021-01-09 DIAGNOSIS — R2681 Unsteadiness on feet: Secondary | ICD-10-CM | POA: Insufficient documentation

## 2021-01-09 NOTE — Therapy (Signed)
[facility] [address] [place], [place], [address] Phone: [phone]   Fax:  [phone]  Physical Therapy Treatment/Recertification  Patient Details  Name: Cory Espinoza MRN: [id number] Date of Birth: 02/25/1971 Referring Provider (PT): Dr. H Shah   Encounter Date: 01/09/2021   PT End of Session - 01/09/21 1532    Visit Number 25    Number of Visits 49    Date for PT Re-Evaluation 03/06/21    Authorization Type eval: 05/21/20    Authorization Time Period PT FOTO completed; FOTO performed 7/22 scored 40    PT Start Time 1535    PT Stop Time 1630    PT Time Calculation (min) 55 min    Equipment Utilized During Treatment Gait belt    Activity Tolerance Patient tolerated treatment well;No increased pain    Behavior During Therapy WFL for tasks assessed/performed           Past Medical History:  Diagnosis Date  . Allergy   . Anginal pain (HCC)   . Anxiety   . Arthritis    lumbar spine  . Asthma   . Atrial fibrillation (HCC)   . Back pain    Severe Lumbar Pain  . CHF (congestive heart failure) (HCC)   . COPD (chronic obstructive pulmonary disease) (HCC)    Restrictive lung disease  . Coronary artery disease   . Dyspnea   . Dysrhythmia    afib  . GERD (gastroesophageal reflux disease)   . Headache    [name], onset October 2020, cluster headaches in the past  . History of kidney stones   . Hyperlipidemia   . Hypertension   . Myocardial infarction (HCC)    at age 34  . Pneumonia   . Restrictive lung disease   . Sleep apnea    unable to use cpap since onset of migraines    Past Surgical History:  Procedure Laterality Date  . ABDOMINAL EXPOSURE N/A 03/25/2020   Procedure: ABDOMINAL EXPOSURE;  Surgeon: Early, Todd F, MD;  Location: [facility] OR;  Service: Vascular;  Laterality: N/A;  . ANTERIOR CERVICAL DECOMP/DISCECTOMY FUSION N/A 01/23/2020   Procedure: ANTERIOR CERVICAL DECOMPRESSION FUSION -  CERVICAL SIX-CERVICAL SEVEN;  Surgeon: Pool, Henry, MD;  Location: [facility] OR;  Service: Neurosurgery;  Laterality: N/A;  . ANTERIOR LUMBAR FUSION N/A 03/25/2020   Procedure: ANTERIOR LUMBAR INTERBODY FUSION LUMBAR FIVE-SACRAL ONE.;  Surgeon: Pool, Henry, MD;  Location: [facility] OR;  Service: Neurosurgery;  Laterality: N/A;  anterior  . BACK SURGERY  1996  . CARDIAC CATHETERIZATION Left 04/27/2016   Procedure: Left Heart Cath and Coronary Angiography;  Surgeon: Bruce J Kowalski, MD;  Location: [facility] INV[facility]IVE [facility];  Service: Cardiovascular;  Laterality: Left;  . CARDIAC CATHETERIZATION N/A 04/27/2016   Procedure: Intravascular Pressure Wire/FFR Study;  Surgeon: Dwayne D Callwood, MD;  Location: [facility] INVASIVE CV LAB;  Service: Cardiovascular;  Laterality: N/A;  . CATHETERIZATION OF PULMONARY ARTERY WITH RETRIEVAL OF FOREIGN BODY Bilateral 04/07/2011   heart  . DEGENERATIVE SPONDYLOLISTHESIS    . KNEE ARTHROSCOPY Bilateral 2004  . LUMBAR DISC SURGERY  1999  . RADICULOPATHY, CERVICAL REGION    . TONSILLECTOMY      There were no vitals filed for this visit.   Subjective Assessment - 01/09/21 1632    Subjective Patient is highly motivated to return to physical therapy after a month. He had COVID and was unable to come to therapy for the past 2 weeks due   to weakness. He reports that when he had an x-ray and MRI conducted on his R knee with only bruising was found on the R knee. No tears or fractures upon imaging. He is unsure if he has had any falls since last therapy session.    Patient is accompained by: Family member    Pertinent History Pt has a complex medical history. 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 He states that he fell 10-12 feet and struck his head, neck, and back. He was able to finish the course which took him approximately 45 minutes more to complete. He does endorse a second fall while finishing the course but did not suffer a second head injury. He  states that he started getting headaches that day which have persisted since that time. 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 He does report improvement in his headaches recently. Neurology was concerned about possible BPPV and have referred him for a vestibular evaluation. In addition since the injury, pt developed RUE and RLE pain and weakness. Pt states that NCV showed abnormal nerve conduction in RUE. He has since undergone a C7-7 ACDF on 01/23/20. He reports initial improvement in RUE strength, but states that he has a gradual return of his RUE weakness. He was also having significant RLE weakness and pain and underwent and L5-S1 anterior lumbar interbody fusion on 03/25/20. Patient reports that he does not have any back precautions but needs to wear the low back brace for one more month. Patient reports he has a follow-up appointment with the surgeon next month. Patient reports he has a follow-up appointment with Dr. Shaw, neurologist, in August. He arrives to therapy ambulating with an upright rollator walker. Pt denies any mention of concussion after his injury. He has been having cognitive issues since his injury and has previously had a heavy metals screen as well as neurocognitive testing. He has had an MRI of his brain with and without contrast on 12/18/19. Results were mildly motion degraded examination. No evidence of acute intracranial abnormality. Minimal chronic small vessel ischemic disease. He has tremor in both his hands. He has a positive family history of Parkinson's disease in his grandfather. He is complaining of constant dizziness and unsteadiness which are worse with activity. Patient states that he frequently loses his balance and his wife has to steady him.    Limitations Lifting;Standing;Walking;House hold activities    Diagnostic tests He has had an MRI of his brain with and without contrast on 12/18/19. Results  were mildly motion degraded examination. No evidence of acute intracranial abnormality. Minimal chronic small vessel ischemic disease.    Patient Stated Goals to be able to ambulate with a cane or no AD, to be able to drive, to be able to ride his motorcycle    Pain Onset 1 to 4 weeks ago             Reassessment of goals: 10MWT: 0.72 m/s with hiking stick; no LOB  TUG: 15.81 seconds; no LOB  5STS: 18.01; difficulty with completing transfer towards 3-4th rep  6MWT:  Seated rested vitals: BP: 132/81 HR: 101 SaO2: 96%  Ambulated for 720 feet with close CGA, using hiking stick; LOB towards last 30 seconds due to RLE weakness and increased dizziness with vertical position   Post-Test Vitals: BP: 141/82 HR: 120 SaO2: 96%  ABC: 60.625%  FOTO:   46   Clinical Impression: Reassessment of goals and outcome measures were updated in today's therapy session. Patient presents with excellent motivation to return to physical therapy to get stronger and more mobile. He presents to be at a fall risk as indicated by TUG score of 15.81 seconds and 10MWT of 0.72 m/s. He continues to be unstable with short and long distances with multiple LOB. He requires close CGA/minA to maintain balance when ambulating. Patient has shown improvement with 6MWT of 720 feet while maintaining vital signs within normal limits. Patient will continue to benefit from skilled physical therapy to improve generalized strength, ROM, and capacity for functional activity. Patient has yet to achieve maximal benefit from skilled PT services and will continue to benefit from additional therapy to see improvements in function.      PT Short Term Goals - 01/09/21 1638      PT SHORT TERM GOAL #1   Title Patient will be independent in home exercise program to improve strength/mobility for better functional independence with ADLs and for self-management.    Time 6    Period Weeks    Status New    Target Date 02/20/21      PT  SHORT TERM GOAL #2   Title Patient will be modified independent in walking on even/uneven surface with least restrictive assistive device, for 10+ minutes without rest break, reporting some difficulty or less to improve walking tolerance with community ambulation including grocery shopping, going to church,etc.    Baseline 02/03: instability, LOB multiple times while ambulating to elevator    Time 6    Period Weeks    Status New    Target Date 02/20/21      PT SHORT TERM GOAL #3   Title Patient will increase ABC scale score >80% to demonstrate better functional mobility and better confidence with ADLs.    Baseline 01/09/21: 60.625%    Time 6    Period Weeks    Status New    Target Date 02/20/21             PT Long Term Goals - 01/09/21 1642      PT LONG TERM GOAL #1   Title Patient will increase FOTO score to equal to or greater than 65 to demonstrate statistically significant improvement in mobility and quality of life.    Baseline scored 41/100 on 05/21/2020; 7/22 40, 8/23: 60/100, 09/30/20=40, 01/09/21: 46    Time 12    Period Weeks    Status New    Target Date 04/03/21      PT LONG TERM GOAL #2   Title Patient will reduce falls risk as indicated by decreased TUG time to less than 11 seconds.    Baseline scored 37.9 sec with TUG on 05/21/2020; 21seconds with elevated rollator 7/22, 8/23: 13.62 sec with up and go walker, 01/09/21: 15.81s with hiking stick    Time 12    Period Weeks    Status New    Target Date 04/03/21      PT LONG TERM GOAL #3   Title Patient will increase right UE and LE gross strength to 4+/5 throughout to improve functional strength for independent gait, increased standing tolerance and increased ADL ability.    Baseline grossly +3/5 to -4/5 right UE and LE strength on 05/21/2020; grossly 4-/5 for RUE, grossly 4-/5 for RLE on 7/22, grossly 4-/5 RUE, grossly 4/5 RLE    Time 8    Period Weeks    Status Deferred        PT LONG TERM GOAL #4   Title Patient  will increase 10 meter walk test to >1.0m/s as to improve gait speed for better community ambulation and to reduce fall risk.    Baseline on 7/22 .57 m/sself selected, fast  .73 m/s with LOB pt reported R knee buckling, elevated rollator used, 8/23: 1.06 m/s with up and go walker, 01/09/21: 0.72 m/s with hiking stick    Time 12    Period Weeks    Status New    Target Date 04/03/21      PT LONG TERM GOAL #5   Title Patient will increase Berg Balance score by > 6 points to demonstrate decreased fall risk during functional activities.    Baseline 8/23: 37/56, 09/30/20=40/56, 12/20: 42/56    Time 8    Period Weeks    Status Deferred      PT LONG TERM GOAL #6   Title Patient (< 60 years old) will complete five times sit to stand test in < 10 seconds indicating an increased LE strength and improved balance.    Baseline 8/23: 22 sec, 09/30/20=13.04 sec, 12/20: 27.05 sec, 01/09/21: 18.01s    Time 12    Period Weeks    Status Partially Met    Target Date 04/03/21      PT LONG TERM GOAL #7   Title Patient will improve 6 min walk test >1000 feet with LRAD for improved gait ability in community.    Baseline 8/23: not tested; 8/30 845, goal adjusted >2000ft, 09/30/20=600 feet - stopped test due to R ankle catching making gait unsafe, 11/25/20= 330 feet - stopped test due to severe dizziness, vomiting/nausea, sweating making gait unsafe; 01/09/21: 720 ft with no rest breaks    Time 12    Period Weeks    Status On-going    Target Date 04/03/21                 Plan - 01/09/21 1657    Clinical Impression Statement Reassessment of goals and outcome measures were updated in today's therapy session. Patient presents with excellent motivation to return to physical therapy to get stronger and more mobile. He presents to be at a fall risk as indicated by TUG score of 15.81 seconds and 10MWT of 0.72 m/s. He continues to be unstable with short and long distances with multiple LOB. He requires close  CGA/minA to maintain balance when ambulating. Patient has shown improvement with 6MWT of 720 feet while maintaining vital signs within normal limits. Patient will continue to benefit from skilled physical therapy to improve generalized strength, ROM, and capacity for functional activity.  Patient has yet to achieve maximal benefit from skilled PT services and will continue to benefit from additional therapy to see improvements in function.    Personal Factors and Comorbidities Comorbidity 3+;Time since onset of injury/illness/exacerbation    Comorbidities anxiety, back pain, COPD, headache, HTN, MI, sleep apnea, migraines    Examination-Activity Limitations Locomotion Level;Transfers;Bed Mobility;Stand;Stairs;Sleep;Squat;Bend    Examination-Participation Restrictions Driving;Medication Management    Stability/Clinical Decision Making Unstable/Unpredictable    Rehab Potential Fair    PT Frequency 2x / week    PT Duration 12 weeks    PT Treatment/Interventions ADLs/Self Care Home Management;Canalith Repostioning;Moist Heat;Electrical Stimulation;Gait training;Stair training;Functional mobility training;Therapeutic activities;Therapeutic exercise;Balance training;Neuromuscular re-education;Patient/family education;Manual techniques;Passive range of motion;Vestibular    PT Next Visit Plan update HEP ea session for continued strengthening/balanceing at home    PT Home Exercise Plan Medbridge Access Code: [email or web address] updated this session      Consulted and Agree with Plan of Care Patient           Patient will benefit from skilled therapeutic intervention in order to improve the following deficits and impairments:  Decreased activity tolerance,Decreased balance,Decreased cognition,Decreased mobility,Difficulty walking,Abnormal gait,Decreased range of motion,Dizziness,Pain,Impaired flexibility,Decreased strength,Impaired sensation,Decreased endurance,Decreased safety awareness  Visit Diagnosis: Muscle  weakness (generalized)  Unsteadiness on feet  Dizziness and giddiness     Problem List Patient Active Problem List   Diagnosis Date Noted  . Degenerative spondylolisthesis 03/25/2020  . Physical deconditioning 03/04/2020  . Lobar pneumonia, unspecified organism (HCC) 03/04/2020  . Intractable hemiplegic migraine with status migrainosus 03/02/2020  . Current tobacco use 02/05/2020  . Abnormal findings on diagnostic imaging of lung 02/05/2020  . Shortness of breath 02/05/2020  . Herniated disc, cervical 01/23/2020  . Cervical spondylosis with myelopathy and radiculopathy 01/23/2020  . Pre-operative respiratory examination 01/12/2020  . Weakness on right side of face 12/22/2019  . Osteoarthritis of spine with radiculopathy, lumbar region 12/11/2019  . Intractable migraine with aura with status migrainosus 11/09/2019  . Former smoker 08/27/2018  . Benign essential HTN 08/27/2018  . Cough productive of purulent sputum 11/08/2017  . GERD (gastroesophageal reflux disease) 05/06/2017  . Contusion of hand 04/22/2017  . BP (high blood pressure) 04/22/2017  . Oxygen desaturation 04/22/2017  . Prostate lump 04/22/2017  . Unstable angina (HCC) 04/27/2016  . Chest pain 04/24/2016  . Morbid obesity (HCC) 01/29/2016  . Elevated ALT measurement 10/30/2015  . Nonspecific elevation of levels of transaminase and lactic acid dehydrogenase (ldh) 10/30/2015  . Reduced libido 10/29/2015  . Hyperlipidemia 08/06/2015  . Anxiety 08/06/2015  . Atherosclerosis of coronary artery 08/06/2015  . Childhood asthma 08/06/2015  . Chronic obstructive pulmonary disease (HCC) 08/06/2015  . OSA (obstructive sleep apnea) 08/06/2015  . Anxiety disorder 08/06/2015  . Atherosclerosis of native coronary artery of native heart with stable angina pectoris (HCC) 08/06/2015  . Uncomplicated asthma 08/06/2015   Hazel Vazirani PT, DPT Hazel M Vazirani 01/09/2021, 5:16 PM  [facility] [address] [place], [place], [address] Phone: [phone]   Fax:  [phone]  Name: Cory Espinoza MRN: [id number] Date of Birth: 07/28/1971   

## 2021-01-10 NOTE — Telephone Encounter (Signed)
Received call back from [facility], who confirmed that a HST is acceptable. HST has been ordered. Patient is aware and voiced his understanding.  Nothing further needed.

## 2021-01-10 NOTE — Telephone Encounter (Signed)
Spoke to [facility] with Dr. Thurmon Fair office for update.  Dr. Annalee Genta has not responded yet. Marliss Czar will call back with an update.

## 2021-01-13 ENCOUNTER — Ambulatory Visit: Payer: BC Managed Care – PPO

## 2021-01-14 ENCOUNTER — Telehealth: Payer: Self-pay | Admitting: Internal Medicine

## 2021-01-14 ENCOUNTER — Telehealth: Payer: Self-pay | Admitting: Adult Health

## 2021-01-14 NOTE — Telephone Encounter (Signed)
Attempted to call Cory Espinoza letting her know that HST has been ordered for pt but unable to reach. Left her a detailed message that order had been placed and that PCCs will contact them to get pt scheduled for it. Nothing further needed.

## 2021-01-15 NOTE — Telephone Encounter (Signed)
error 

## 2021-01-16 ENCOUNTER — Ambulatory Visit: Payer: BC Managed Care – PPO | Admitting: Physical Therapy

## 2021-01-16 ENCOUNTER — Other Ambulatory Visit: Payer: Self-pay

## 2021-01-16 DIAGNOSIS — R42 Dizziness and giddiness: Secondary | ICD-10-CM

## 2021-01-16 DIAGNOSIS — M6281 Muscle weakness (generalized): Secondary | ICD-10-CM

## 2021-01-16 DIAGNOSIS — R2681 Unsteadiness on feet: Secondary | ICD-10-CM

## 2021-01-16 NOTE — Therapy (Signed)
[place] MAIN [facility] SERVICES [address] [place], [place], [address] Phone: [phone]   Fax:  [phone]  Physical Therapy Treatment  Patient Details  Name: Cory Espinoza MRN: [id number] Date of Birth: Apr 20, 1971 Referring Provider (PT): Dr. Joselyn Arrow   Encounter Date: 01/16/2021   PT End of Session - 01/16/21 1524    Visit Number 26    Number of Visits 29    Date for PT Re-Evaluation 03/06/21    Authorization Type eval: 05/21/20    Authorization Time Period PT FOTO completed; FOTO performed 7/22 scored 40    PT Start Time 1515    PT Stop Time 1600    PT Time Calculation (min) 45 min    Equipment Utilized During Treatment Gait belt    Activity Tolerance Patient tolerated treatment well;No increased pain    Behavior During Therapy WFL for tasks assessed/performed           Past Medical History:  Diagnosis Date  . Allergy   . Anginal pain ([place])   . Anxiety   . Arthritis    lumbar spine  . Asthma   . Atrial fibrillation ([place])   . Back pain    Severe Lumbar Pain  . CHF (congestive heart failure) ([place])   . COPD (chronic obstructive pulmonary disease) ([facility])    Restrictive lung disease  . Coronary artery disease   . Dyspnea   . Dysrhythmia    afib  . GERD (gastroesophageal reflux disease)   . Headache    [name], onset October 2020, cluster headaches in the past  . History of kidney stones   . Hyperlipidemia   . Hypertension   . Myocardial infarction ([place])    at age 73  . Pneumonia   . Restrictive lung disease   . Sleep apnea    unable to use cpap since onset of migraines    Past Surgical History:  Procedure Laterality Date  . ABDOMINAL EXPOSURE N/A 03/25/2020   Procedure: ABDOMINAL EXPOSURE;  Surgeon: Rosetta Posner, MD;  Location: [facility] OR;  Service: Vascular;  Laterality: N/A;  . ANTERIOR CERVICAL DECOMP/DISCECTOMY FUSION N/A 01/23/2020   Procedure: ANTERIOR CERVICAL DECOMPRESSION FUSION - CERVICAL  SIX-CERVICAL SEVEN;  Surgeon: Earnie Larsson, MD;  Location: [facility];  Service: Neurosurgery;  Laterality: N/A;  . ANTERIOR LUMBAR FUSION N/A 03/25/2020   Procedure: ANTERIOR LUMBAR INTERBODY FUSION LUMBAR FIVE-SACRAL ONE.;  Surgeon: Earnie Larsson, MD;  Location: [facility];  Service: Neurosurgery;  Laterality: N/A;  anterior  . BACK SURGERY  1996  . CARDIAC CATHETERIZATION Left 04/27/2016   Procedure: Left Heart Cath and Coronary Angiography;  Surgeon: Corey Skains, MD;  Location: [facility];  Service: Cardiovascular;  Laterality: Left;  . CARDIAC CATHETERIZATION N/A 04/27/2016   Procedure: Intravascular Pressure Wire/FFR Study;  Surgeon: Yolonda Kida, MD;  Location: [facility];  Service: Cardiovascular;  Laterality: N/A;  . CATHETERIZATION OF PULMONARY ARTERY WITH RETRIEVAL OF FOREIGN BODY Bilateral 04/07/2011   heart  . DEGENERATIVE SPONDYLOLISTHESIS    . KNEE ARTHROSCOPY Bilateral 2004  . [place] SURGERY  1999  . RADICULOPATHY, CERVICAL REGION    . TONSILLECTOMY      There were no vitals filed for this visit.   Subjective Assessment - 01/16/21 1714    Subjective Patient reports that he was unable to attend therapy session earlier this week due to severe episode of vertigo. He states he feels great today and is ready  to get back to therapy.    Patient is accompained by: Family member    Pertinent History Pt has a complex medical history. 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 He states that he fell 10-12 feet and struck his head, neck, and back. He was able to finish the course which took him approximately 45 minutes more to complete. He does endorse a second fall while finishing the course but did not suffer a second head injury. He states that he started getting headaches that day which have persisted since that time. 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  He does report improvement in his headaches recently. Neurology was concerned about possible BPPV and have referred him for a vestibular evaluation. In addition since the injury, pt developed RUE and RLE pain and weakness. Pt states that NCV showed abnormal nerve conduction in RUE. He has since undergone a C7-7 ACDF on 01/23/20. He reports initial improvement in RUE strength, but states that he has a gradual return of his RUE weakness. He was also having significant RLE weakness and pain and underwent and L5-S1 anterior lumbar interbody fusion on 03/25/20. Patient reports that he does not have any back precautions but needs to wear the low back brace for one more month. Patient reports he has a follow-up appointment with the surgeon next month. Patient reports he has a follow-up appointment with Dr. Brigitte Pulse, neurologist, in August. He arrives to therapy ambulating with an upright rollator walker. Pt denies any mention of concussion after his injury. He has been having cognitive issues since his injury and has previously had a heavy metals screen as well as neurocognitive testing. He has had an MRI of his brain with and without contrast on 12/18/19. Results were mildly motion degraded examination. No evidence of acute intracranial abnormality. Minimal chronic small vessel ischemic disease. He has tremor in both his hands. He has a positive family history of Parkinson's disease in his grandfather. He is complaining of constant dizziness and unsteadiness which are worse with activity. Patient states that he frequently loses his balance and his wife has to steady him.    Limitations Lifting;Standing;Walking;House hold activities    Diagnostic tests He has had an MRI of his brain with and without contrast on 12/18/19. Results were mildly motion degraded examination. No evidence of acute intracranial abnormality. Minimal chronic small vessel ischemic disease.    Patient Stated Goals to be able to ambulate with a cane or no  AD, to be able to drive, to be able to ride his motorcycle    Currently in Pain? No/denies    Pain Onset 1 to 4 weeks ago           LLE with 2.5# ankle weights, no weights on RLE: Seated LAQ x 2 10 reps;  Seated marches x 2 10 reps Standing hip abduction x 2 10 reps x BUE support  Standing hamstring curls x 2 10 reps x no BUE support; instability with exercises and requires minA-modA to maintain balance Standing step overs on orange hurdle x 2 10 reps without BUE support Seated cross overs x 2 10 reps  Heel raises with BUE support x 2 10 reps Mini squats with BUE support x 2 10 reps   Clinical Impression: Patient tolerated strengthening exercises with fair tolerance to activity. He demonstrates increased shortness of breath and increased HR after  completing standing exercises. He is able to recover his SaO2 and HR immediately with seated rest break. Therapist provided patient education on referring back to PCP due to severe vertigo and dizziness. Patient will continue to benefit from skilled physical therapy to improve generalized strength, ROM, and capacity for functional activity.           PT Short Term Goals - 01/09/21 1638      PT SHORT TERM GOAL #1   Title Patient will be independent in home exercise program to improve strength/mobility for better functional independence with ADLs and for self-management.    Time 6    Period Weeks    Status New    Target Date 02/20/21      PT SHORT TERM GOAL #2   Title Patient will be modified independent in walking on even/uneven surface with least restrictive assistive device, for 10+ minutes without rest break, reporting some difficulty or less to improve walking tolerance with community ambulation including grocery shopping, going to church,etc.    Baseline 02/03: instability, LOB multiple times while ambulating to elevator    Time 6    Period Weeks    Status New    Target Date 02/20/21      PT SHORT TERM GOAL #3   Title Patient  will increase ABC scale score >80% to demonstrate better functional mobility and better confidence with ADLs.    Baseline 01/09/21: 60.625%    Time 6    Period Weeks    Status New    Target Date 02/20/21             PT Long Term Goals - 01/09/21 1642      PT LONG TERM GOAL #1   Title Patient will increase FOTO score to equal to or greater than 65 to demonstrate statistically significant improvement in mobility and quality of life.    Baseline scored 41/100 on 05/21/2020; 7/22 40, 8/23: 60/100, 09/30/20=40, 01/09/21: 46    Time 12    Period Weeks    Status New    Target Date 04/03/21      PT LONG TERM GOAL #2   Title Patient will reduce falls risk as indicated by decreased TUG time to less than 11 seconds.    Baseline scored 37.9 sec with TUG on 05/21/2020; 21seconds with elevated rollator 7/22, 8/23: 13.62 sec with up and go walker, 01/09/21: 15.81s with hiking stick    Time 12    Period Weeks    Status New    Target Date 04/03/21      PT LONG TERM GOAL #3   Title Patient will increase right UE and LE gross strength to 4+/5 throughout to improve functional strength for independent gait, increased standing tolerance and increased ADL ability.    Baseline grossly +3/5 to -4/5 right UE and LE strength on 05/21/2020; grossly 4-/5 for RUE, grossly 4-/5 for RLE on 7/22, grossly 4-/5 RUE, grossly 4/5 RLE    Time 8    Period Weeks    Status Deferred      PT LONG TERM GOAL #4   Title Patient will increase 10 meter walk test to >1.55ms as to improve gait speed for better community ambulation and to reduce fall risk.    Baseline on 7/22 .57 m/sself selected, fast  .73 m/s with LOB pt reported R knee buckling, elevated rollator used, 8/23: 1.06 m/s with up and go walker, 01/09/21: 0.72 m/s with hiking stick    Time 12  Period Weeks    Status New    Target Date 04/03/21      PT LONG TERM GOAL #5   Title Patient will increase Berg Balance score by > 6 points to demonstrate decreased  fall risk during functional activities.    Baseline 8/23: 37/56, 09/30/20=40/56, 12/20: 42/56    Time 8    Period Weeks    Status Deferred      PT LONG TERM GOAL #6   Title Patient (< 33 years old) will complete five times sit to stand test in < 10 seconds indicating an increased LE strength and improved balance.    Baseline 8/23: 22 sec, 09/30/20=13.04 sec, 12/20: 27.05 sec, 01/09/21: 18.01s    Time 12    Period Weeks    Status Partially Met    Target Date 04/03/21      PT LONG TERM GOAL #7   Title Patient will improve 6 min walk test >1000 feet with LRAD for improved gait ability in community.    Baseline 8/23: not tested; 8/30 845, goal adjusted >208f, 09/30/20=600 feet - stopped test due to R ankle catching making gait unsafe, 11/25/20= 330 feet - stopped test due to severe dizziness, vomiting/nausea, sweating making gait unsafe; 01/09/21: 720 ft with no rest breaks    Time 12    Period Weeks    Status On-going    Target Date 04/03/21                 Plan - 01/16/21 1723    Clinical Impression Statement Patient tolerated strengthening exercises with fair tolerance to activity. He demonstrates increased shortness of breath and increased HR after completing standing exercises. He is able to recover his SaO2 and HR immediately with seated rest break. Therapist provided patient education on referring back to PCP due to severe vertigo and dizziness. Patient will continue to benefit from skilled physical therapy to improve generalized strength, ROM, and capacity for functional activity.    Personal Factors and Comorbidities Comorbidity 3+;Time since onset of injury/illness/exacerbation    Comorbidities anxiety, back pain, COPD, headache, HTN, MI, sleep apnea, migraines    Examination-Activity Limitations Locomotion Level;Transfers;Bed Mobility;Stand;Stairs;Sleep;Squat;Bend    Examination-Participation Restrictions Driving;Medication Management    Stability/Clinical Decision  Making Unstable/Unpredictable    Rehab Potential Fair    PT Frequency 2x / week    PT Duration 12 weeks    PT Treatment/Interventions ADLs/Self Care Home Management;Canalith Repostioning;Moist Heat;Electrical Stimulation;Gait training;Stair training;Functional mobility training;Therapeutic activities;Therapeutic exercise;Balance training;Neuromuscular re-education;Patient/family education;Manual techniques;Passive range of motion;Vestibular    PT Next Visit Plan update HEP ea session for continued strengthening/balanceing at home    PT HLomaAccess Code: [email or web address] updated this session    Consulted and Agree with Plan of Care Patient           Patient will benefit from skilled therapeutic intervention in order to improve the following deficits and impairments:  Decreased activity tolerance,Decreased balance,Decreased cognition,Decreased mobility,Difficulty walking,Abnormal gait,Decreased range of motion,Dizziness,Pain,Impaired flexibility,Decreased strength,Impaired sensation,Decreased endurance,Decreased safety awareness  Visit Diagnosis: Muscle weakness (generalized)  Unsteadiness on feet  Dizziness and giddiness     Problem List Patient Active Problem List   Diagnosis Date Noted  . Degenerative spondylolisthesis 03/25/2020  . Physical deconditioning 03/04/2020  . Lobar pneumonia, unspecified organism ([place] 03/04/2020  . Intractable hemiplegic migraine with status migrainosus 03/02/2020  . Current tobacco use 02/05/2020  . Abnormal findings on diagnostic imaging of lung 02/05/2020  . Shortness of breath 02/05/2020  . Herniated disc,  cervical 01/23/2020  . Cervical spondylosis with myelopathy and radiculopathy 01/23/2020  . Pre-operative respiratory examination 01/12/2020  . Weakness on right side of face 12/22/2019  . Osteoarthritis of spine with radiculopathy, lumbar region 12/11/2019  . Intractable migraine with aura with status migrainosus 11/09/2019   . Former smoker 08/27/2018  . Benign essential HTN 08/27/2018  . Cough productive of purulent sputum 11/08/2017  . GERD (gastroesophageal reflux disease) 05/06/2017  . Contusion of hand 04/22/2017  . BP (high blood pressure) 04/22/2017  . Oxygen desaturation 04/22/2017  . Prostate lump 04/22/2017  . Unstable angina ([place]) 04/27/2016  . Chest pain 04/24/2016  . Morbid obesity ([place]) 01/29/2016  . Elevated ALT measurement 10/30/2015  . Nonspecific elevation of levels of transaminase and lactic acid dehydrogenase (ldh) 10/30/2015  . Reduced libido 10/29/2015  . Hyperlipidemia 08/06/2015  . Anxiety 08/06/2015  . Atherosclerosis of coronary artery 08/06/2015  . Childhood asthma 08/06/2015  . Chronic obstructive pulmonary disease ([place]) 08/06/2015  . OSA (obstructive sleep apnea) 08/06/2015  . Anxiety disorder 08/06/2015  . Atherosclerosis of native coronary artery of native heart with stable angina pectoris ([place]) 08/06/2015  . Uncomplicated asthma 46/27/0350   Karl Luke PT, DPT Netta Corrigan 01/16/2021, 5:23 PM  [place] MAIN [facility] SERVICES [address] [place], [place], [address] Phone: [phone]   Fax:  [phone]  Name: Cory Espinoza MRN: [id number] Date of Birth: May 06, 1971

## 2021-01-17 ENCOUNTER — Other Ambulatory Visit: Payer: Self-pay | Admitting: Neurosurgery

## 2021-01-20 ENCOUNTER — Ambulatory Visit: Payer: BC Managed Care – PPO

## 2021-01-20 ENCOUNTER — Other Ambulatory Visit: Payer: Self-pay

## 2021-01-20 DIAGNOSIS — R2681 Unsteadiness on feet: Secondary | ICD-10-CM

## 2021-01-20 DIAGNOSIS — M6281 Muscle weakness (generalized): Secondary | ICD-10-CM

## 2021-01-20 DIAGNOSIS — R42 Dizziness and giddiness: Secondary | ICD-10-CM

## 2021-01-20 NOTE — Therapy (Signed)
[place] MAIN [facility] SERVICES [address] [place], [place], [address] Phone: [phone]   Fax:  [phone]  Physical Therapy Treatment  Patient Details  Name: Cory Espinoza MRN: [id number] Date of Birth: 11-06-1971 Referring Provider (PT): Dr. Joselyn Arrow   Encounter Date: 01/20/2021   PT End of Session - 01/20/21 1525    Visit Number 27    Number of Visits 84    Date for PT Re-Evaluation 03/06/21    Authorization Type eval: 05/21/20    Authorization Time Period PT FOTO completed; FOTO performed 7/22 scored 40    PT Start Time 0318    PT Stop Time 0400    PT Time Calculation (min) 42 min    Equipment Utilized During Treatment Gait belt    Activity Tolerance Patient tolerated treatment well;No increased pain    Behavior During Therapy WFL for tasks assessed/performed           Past Medical History:  Diagnosis Date  . Allergy   . Anginal pain ([place])   . Anxiety   . Arthritis    lumbar spine  . Asthma   . Atrial fibrillation ([place])   . Back pain    Severe Lumbar Pain  . CHF (congestive heart failure) ([place])   . COPD (chronic obstructive pulmonary disease) ([facility])    Restrictive lung disease  . Coronary artery disease   . Dyspnea   . Dysrhythmia    afib  . GERD (gastroesophageal reflux disease)   . Headache    [name], onset October 2020, cluster headaches in the past  . History of kidney stones   . Hyperlipidemia   . Hypertension   . Myocardial infarction ([place])    at age 40  . Pneumonia   . Restrictive lung disease   . Sleep apnea    unable to use cpap since onset of migraines    Past Surgical History:  Procedure Laterality Date  . ABDOMINAL EXPOSURE N/A 03/25/2020   Procedure: ABDOMINAL EXPOSURE;  Surgeon: Rosetta Posner, MD;  Location: [facility] OR;  Service: Vascular;  Laterality: N/A;  . ANTERIOR CERVICAL DECOMP/DISCECTOMY FUSION N/A 01/23/2020   Procedure: ANTERIOR CERVICAL DECOMPRESSION FUSION - CERVICAL  SIX-CERVICAL SEVEN;  Surgeon: Earnie Larsson, MD;  Location: [place];  Service: Neurosurgery;  Laterality: N/A;  . ANTERIOR LUMBAR FUSION N/A 03/25/2020   Procedure: ANTERIOR LUMBAR INTERBODY FUSION LUMBAR FIVE-SACRAL ONE.;  Surgeon: Earnie Larsson, MD;  Location: [place];  Service: Neurosurgery;  Laterality: N/A;  anterior  . BACK SURGERY  1996  . CARDIAC CATHETERIZATION Left 04/27/2016   Procedure: Left Heart Cath and Coronary Angiography;  Surgeon: Corey Skains, MD;  Location: [place] CV LAB;  Service: Cardiovascular;  Laterality: Left;  . CARDIAC CATHETERIZATION N/A 04/27/2016   Procedure: Intravascular Pressure Wire/FFR Study;  Surgeon: Yolonda Kida, MD;  Location: [place] CV LAB;  Service: Cardiovascular;  Laterality: N/A;  . CATHETERIZATION OF PULMONARY ARTERY WITH RETRIEVAL OF FOREIGN BODY Bilateral 04/07/2011   heart  . DEGENERATIVE SPONDYLOLISTHESIS    . KNEE ARTHROSCOPY Bilateral 2004  . [place] SURGERY  1999  . RADICULOPATHY, CERVICAL REGION    . TONSILLECTOMY      There were no vitals filed for this visit.   Subjective Assessment - 01/20/21 1522    Subjective The patient reports having a couple bad days of vertigo last week.  The patient reports yesterday he had the worst vertigo  and still has some symptoms  today.    Patient is accompained by: Family member    Pertinent History Pt has a complex medical history. He reports that he fell during a [employer] training in [place] in the fall of 2020. He states that he fell 10-12 feet and struck his head, neck, and back. He was able to finish the course which took him approximately 45 minutes more to complete. He does endorse a second fall while finishing the course but did not suffer a second head injury. He states that he started getting headaches that day which have persisted since that time. 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 He does report  improvement in his headaches recently. Neurology was concerned about possible BPPV and have referred him for a vestibular evaluation. In addition since the injury, pt developed RUE and RLE pain and weakness. Pt states that NCV showed abnormal nerve conduction in RUE. He has since undergone a C7-7 ACDF on 01/23/20. He reports initial improvement in RUE strength, but states that he has a gradual return of his RUE weakness. He was also having significant RLE weakness and pain and underwent and L5-S1 anterior lumbar interbody fusion on 03/25/20. Patient reports that he does not have any back precautions but needs to wear the low back brace for one more month. Patient reports he has a follow-up appointment with the surgeon next month. Patient reports he has a follow-up appointment with Dr. Brigitte Pulse, neurologist, in August. He arrives to therapy ambulating with an upright rollator walker. Pt denies any mention of concussion after his injury. He has been having cognitive issues since his injury and has previously had a heavy metals screen as well as neurocognitive testing. He has had an MRI of his brain with and without contrast on 12/18/19. Results were mildly motion degraded examination. No evidence of acute intracranial abnormality. Minimal chronic small vessel ischemic disease. He has tremor in both his hands. He has a positive family history of Parkinson's disease in his grandfather. He is complaining of constant dizziness and unsteadiness which are worse with activity. Patient states that he frequently loses his balance and his wife has to steady him.    Limitations Lifting;Standing;Walking;House hold activities    Diagnostic tests He has had an MRI of his brain with and without contrast on 12/18/19. Results were mildly motion degraded examination. No evidence of acute intracranial abnormality. Minimal chronic small vessel ischemic disease.    Patient Stated Goals to be able to ambulate with a cane or no AD, to be able to  drive, to be able to ride his motorcycle    Currently in Pain? No/denies    Pain Score 0-No pain    Pain Onset 1 to 4 weeks ago           LLE with 2.5# ankle weights, no weights on RLE (attempt 1# nv) Seated LAQ x 2 10 reps;  Seated marches x 2 10 reps  LLE 2.5#, RLE 1#   Standing hip abduction x 2 10 reps x BUE support  Standing hamstring curls x 2 10 reps bilateral UE support  Heel raises with BUE support x 2 10 reps Mini squats with BUE support x 2 10 reps Hurdles step over x10 with right, left x6, light single UE support, mod CGA  Hurdles side stepping x10 light single UE support, mod CGA  Airex WBOS EO 30"x2, mod posterior trunk lean Airex NBOS EO 30"x3, mod posterior  trunk lean  Tandem walking fwd/bwd x2 laps                     PT Education - 01/20/21 1605    Education Details exercises    Person(s) Educated Patient    Methods Explanation    Comprehension Verbalized understanding            PT Short Term Goals - 01/09/21 1638      PT SHORT TERM GOAL #1   Title Patient will be independent in home exercise program to improve strength/mobility for better functional independence with ADLs and for self-management.    Time 6    Period Weeks    Status New    Target Date 02/20/21      PT SHORT TERM GOAL #2   Title Patient will be modified independent in walking on even/uneven surface with least restrictive assistive device, for 10+ minutes without rest break, reporting some difficulty or less to improve walking tolerance with community ambulation including grocery shopping, going to church,etc.    Baseline 02/03: instability, LOB multiple times while ambulating to elevator    Time 6    Period Weeks    Status New    Target Date 02/20/21      PT SHORT TERM GOAL #3   Title Patient will increase ABC scale score >80% to demonstrate better functional mobility and better confidence with ADLs.    Baseline 01/09/21: 60.625%    Time 6    Period Weeks     Status New    Target Date 02/20/21             PT Long Term Goals - 01/09/21 1642      PT LONG TERM GOAL #1   Title Patient will increase FOTO score to equal to or greater than 65 to demonstrate statistically significant improvement in mobility and quality of life.    Baseline scored 41/100 on 05/21/2020; 7/22 40, 8/23: 60/100, 09/30/20=40, 01/09/21: 46    Time 12    Period Weeks    Status New    Target Date 04/03/21      PT LONG TERM GOAL #2   Title Patient will reduce falls risk as indicated by decreased TUG time to less than 11 seconds.    Baseline scored 37.9 sec with TUG on 05/21/2020; 21seconds with elevated rollator 7/22, 8/23: 13.62 sec with up and go walker, 01/09/21: 15.81s with hiking stick    Time 12    Period Weeks    Status New    Target Date 04/03/21      PT LONG TERM GOAL #3   Title Patient will increase right UE and LE gross strength to 4+/5 throughout to improve functional strength for independent gait, increased standing tolerance and increased ADL ability.    Baseline grossly +3/5 to -4/5 right UE and LE strength on 05/21/2020; grossly 4-/5 for RUE, grossly 4-/5 for RLE on 7/22, grossly 4-/5 RUE, grossly 4/5 RLE    Time 8    Period Weeks    Status Deferred      PT LONG TERM GOAL #4   Title Patient will increase 10 meter walk test to >1.1ms as to improve gait speed for better community ambulation and to reduce fall risk.    Baseline on 7/22 .57 m/sself selected, fast  .73 m/s with LOB pt reported R knee buckling, elevated rollator used, 8/23: 1.06 m/s with up and go walker, 01/09/21: 0.72 m/s with hiking stick  Time 12    Period Weeks    Status New    Target Date 04/03/21      PT LONG TERM GOAL #5   Title Patient will increase Berg Balance score by > 6 points to demonstrate decreased fall risk during functional activities.    Baseline 8/23: 37/56, 09/30/20=40/56, 12/20: 42/56    Time 8    Period Weeks    Status Deferred      PT LONG TERM GOAL #6    Title Patient (< 74 years old) will complete five times sit to stand test in < 10 seconds indicating an increased LE strength and improved balance.    Baseline 8/23: 22 sec, 09/30/20=13.04 sec, 12/20: 27.05 sec, 01/09/21: 18.01s    Time 12    Period Weeks    Status Partially Met    Target Date 04/03/21      PT LONG TERM GOAL #7   Title Patient will improve 6 min walk test >1000 feet with LRAD for improved gait ability in community.    Baseline 8/23: not tested; 8/30 845, goal adjusted >209f, 09/30/20=600 feet - stopped test due to R ankle catching making gait unsafe, 11/25/20= 330 feet - stopped test due to severe dizziness, vomiting/nausea, sweating making gait unsafe; 01/09/21: 720 ft with no rest breaks    Time 12    Period Weeks    Status On-going    Target Date 04/03/21                 Plan - 01/20/21 1525    Clinical Impression Statement The patient continues with weakness right LE greater than left.  Advanced strengthening with additional of ankle weight on right LE with appropriate level of fatigue.  The patient does have intermittent episodes of his right knee bucking during weightbearing due to persistant weakness.  The patient continues to benefit from additional skilled PT services to improve LE strength and balance for improved function and improved quality of life.    Personal Factors and Comorbidities Comorbidity 3+;Time since onset of injury/illness/exacerbation    Comorbidities anxiety, back pain, COPD, headache, HTN, MI, sleep apnea, migraines    Examination-Activity Limitations Locomotion Level;Transfers;Bed Mobility;Stand;Stairs;Sleep;Squat;Bend    Examination-Participation Restrictions Driving;Medication Management    Stability/Clinical Decision Making Unstable/Unpredictable    Rehab Potential Fair    PT Frequency 2x / week    PT Duration 12 weeks    PT Treatment/Interventions ADLs/Self Care Home Management;Canalith Repostioning;Moist Heat;Electrical  Stimulation;Gait training;Stair training;Functional mobility training;Therapeutic activities;Therapeutic exercise;Balance training;Neuromuscular re-education;Patient/family education;Manual techniques;Passive range of motion;Vestibular    PT Next Visit Plan update HEP ea session for continued strengthening/balanceing at home    PT [place] Code: [email or web address] updated this session    Consulted and Agree with Plan of Care Patient           Patient will benefit from skilled therapeutic intervention in order to improve the following deficits and impairments:  Decreased activity tolerance,Decreased balance,Decreased cognition,Decreased mobility,Difficulty walking,Abnormal gait,Decreased range of motion,Dizziness,Pain,Impaired flexibility,Decreased strength,Impaired sensation,Decreased endurance,Decreased safety awareness  Visit Diagnosis: Muscle weakness (generalized)  Unsteadiness on feet  Dizziness and giddiness     Problem List Patient Active Problem List   Diagnosis Date Noted  . Degenerative spondylolisthesis 03/25/2020  . Physical deconditioning 03/04/2020  . Lobar pneumonia, unspecified organism ([place] 03/04/2020  . Intractable hemiplegic migraine with status migrainosus 03/02/2020  . Current tobacco use 02/05/2020  . Abnormal findings on diagnostic imaging of lung 02/05/2020  . Shortness of breath 02/05/2020  .  Herniated disc, cervical 01/23/2020  . Cervical spondylosis with myelopathy and radiculopathy 01/23/2020  . Pre-operative respiratory examination 01/12/2020  . Weakness on right side of face 12/22/2019  . Osteoarthritis of spine with radiculopathy, lumbar region 12/11/2019  . Intractable migraine with aura with status migrainosus 11/09/2019  . Former smoker 08/27/2018  . Benign essential HTN 08/27/2018  . Cough productive of purulent sputum 11/08/2017  . GERD (gastroesophageal reflux disease) 05/06/2017  . Contusion of hand 04/22/2017  . BP  (high blood pressure) 04/22/2017  . Oxygen desaturation 04/22/2017  . Prostate lump 04/22/2017  . Unstable angina ([place]) 04/27/2016  . Chest pain 04/24/2016  . Morbid obesity ([place]) 01/29/2016  . Elevated ALT measurement 10/30/2015  . Nonspecific elevation of levels of transaminase and lactic acid dehydrogenase (ldh) 10/30/2015  . Reduced libido 10/29/2015  . Hyperlipidemia 08/06/2015  . Anxiety 08/06/2015  . Atherosclerosis of coronary artery 08/06/2015  . Childhood asthma 08/06/2015  . Chronic obstructive pulmonary disease ([place]) 08/06/2015  . OSA (obstructive sleep apnea) 08/06/2015  . Anxiety disorder 08/06/2015  . Atherosclerosis of native coronary artery of native heart with stable angina pectoris ([place]) 08/06/2015  . Uncomplicated asthma 66/91/6756    Hal Morales PT, DPT 01/20/2021, 4:08 PM  [place] MAIN [facility] SERVICES [address] [place], [place], [address] Phone: [phone]   Fax:  [phone]  Name: Cory Espinoza MRN: [id number] Date of Birth: Sep 11, 1971

## 2021-01-23 ENCOUNTER — Ambulatory Visit: Payer: BC Managed Care – PPO

## 2021-01-27 ENCOUNTER — Ambulatory Visit: Payer: BC Managed Care – PPO

## 2021-01-27 ENCOUNTER — Other Ambulatory Visit: Payer: Self-pay

## 2021-01-27 DIAGNOSIS — M6281 Muscle weakness (generalized): Secondary | ICD-10-CM

## 2021-01-27 DIAGNOSIS — R42 Dizziness and giddiness: Secondary | ICD-10-CM

## 2021-01-27 DIAGNOSIS — R2681 Unsteadiness on feet: Secondary | ICD-10-CM

## 2021-01-27 NOTE — Therapy (Signed)
[place] MAIN [facility] SERVICES [address] [place], [place], [address] Phone: [phone]   Fax:  [phone]  Physical Therapy Treatment  Patient Details  Name: Cory Espinoza MRN: [id number] Date of Birth: [birth year] Referring Provider (PT): Dr. Joselyn Espinoza   Encounter Date: 01/27/2021   PT End of Session - 01/27/21 1528    Visit Number 28    Number of Visits 45    Date for PT Re-Evaluation 03/06/21    Authorization Type eval: 05/21/20    Authorization Time Period PT FOTO completed; FOTO performed 7/22 scored 40    PT Start Time 0318    PT Stop Time 0400    PT Time Calculation (min) 42 min    Equipment Utilized During Treatment Gait belt    Activity Tolerance Patient tolerated treatment well;No increased pain    Behavior During Therapy WFL for tasks assessed/performed           Past Medical History:  Diagnosis Date  . Allergy   . Anginal pain ([place])   . Anxiety   . Arthritis    lumbar spine  . Asthma   . Atrial fibrillation ([place])   . Back pain    Severe Lumbar Pain  . CHF (congestive heart failure) ([place])   . COPD (chronic obstructive pulmonary disease) ([facility])    Restrictive lung disease  . Coronary artery disease   . Dyspnea   . Dysrhythmia    afib  . GERD (gastroesophageal reflux disease)   . Headache    [name], onset October 2020, cluster headaches in the past  . History of kidney stones   . Hyperlipidemia   . Hypertension   . Myocardial infarction ([place])    at age 6  . Pneumonia   . Restrictive lung disease   . Sleep apnea    unable to use cpap since onset of migraines    Past Surgical History:  Procedure Laterality Date  . ABDOMINAL EXPOSURE N/A 03/25/2020   Procedure: ABDOMINAL EXPOSURE;  Surgeon: Cory Posner, MD;  Location: [facility] OR;  Service: Vascular;  Laterality: N/A;  . ANTERIOR CERVICAL DECOMP/DISCECTOMY FUSION N/A 01/23/2020   Procedure: ANTERIOR CERVICAL DECOMPRESSION FUSION - CERVICAL  SIX-CERVICAL SEVEN;  Surgeon: Cory Larsson, MD;  Location: [facility];  Service: Neurosurgery;  Laterality: N/A;  . ANTERIOR LUMBAR FUSION N/A 03/25/2020   Procedure: ANTERIOR LUMBAR INTERBODY FUSION LUMBAR FIVE-SACRAL ONE.;  Surgeon: Cory Larsson, MD;  Location: [facility];  Service: Neurosurgery;  Laterality: N/A;  anterior  . BACK SURGERY  1996  . CARDIAC CATHETERIZATION Left 04/27/2016   Procedure: Left Heart Cath and Coronary Angiography;  Surgeon: Cory Skains, MD;  Location: [place] [facility];  Service: Cardiovascular;  Laterality: Left;  . CARDIAC CATHETERIZATION N/A 04/27/2016   Procedure: Intravascular Pressure Wire/FFR Study;  Surgeon: Cory Kida, MD;  Location: [facility] [place];  Service: Cardiovascular;  Laterality: N/A;  . CATHETERIZATION OF PULMONARY ARTERY WITH RETRIEVAL OF FOREIGN BODY Bilateral 04/07/2011   heart  . DEGENERATIVE SPONDYLOLISTHESIS    . KNEE ARTHROSCOPY Bilateral 2004  . [place] SURGERY  1999  . RADICULOPATHY, CERVICAL REGION    . TONSILLECTOMY      There were no vitals filed for this visit.   Subjective Assessment - 01/27/21 1521    Subjective The patient reports he is doing alright.  The patient reports having a bad day Thursday and cancelled PT but did go to the neurologist.  The patient  reports he is continuing to get the hemiplegic headaches.  Patient reports having 2 falls since his last PT visit, one when he went ot grab his pants and the other when reaching forward for his brace.    Patient is accompained by: Family member    Pertinent History Pt has a complex medical history. He reports that he fell during a [employer] training in [place] in the fall of 2020. He states that he fell 10-12 feet and struck his head, neck, and back. He was able to finish the course which took him approximately 45 minutes more to complete. He does endorse a second fall while finishing the course but did not suffer a second head injury. He states that he started getting  headaches that day which have persisted since that time. 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 He does report improvement in his headaches recently. Neurology was concerned about possible BPPV and have referred him for a vestibular evaluation. In addition since the injury, pt developed RUE and RLE pain and weakness. Pt states that NCV showed abnormal nerve conduction in RUE. He has since undergone a C7-7 ACDF on 01/23/20. He reports initial improvement in RUE strength, but states that he has a gradual return of his RUE weakness. He was also having significant RLE weakness and pain and underwent and L5-S1 anterior lumbar interbody fusion on 03/25/20. Patient reports that he does not have any back precautions but needs to wear the low back brace for one more month. Patient reports he has a follow-up appointment with the surgeon next month. Patient reports he has a follow-up appointment with Cory Espinoza, neurologist, in August. He arrives to therapy ambulating with an upright rollator walker. Pt denies any mention of concussion after his injury. He has been having cognitive issues since his injury and has previously had a heavy metals screen as well as neurocognitive testing. He has had an MRI of his brain with and without contrast on 12/18/19. Results were mildly motion degraded examination. No evidence of acute intracranial abnormality. Minimal chronic small vessel ischemic disease. He has tremor in both his hands. He has a positive family history of Parkinson's disease in his grandfather. He is complaining of constant dizziness and unsteadiness which are worse with activity. Patient states that he frequently loses his balance and his wife has to steady him.    Limitations Lifting;Standing;Walking;House hold activities    Diagnostic tests He has had an MRI of his brain with and without contrast on 12/18/19. Results were mildly motion degraded  examination. No evidence of acute intracranial abnormality. Minimal chronic small vessel ischemic disease.    Patient Stated Goals to be able to ambulate with a cane or no AD, to be able to drive, to be able to ride his motorcycle    Currently in Pain? No/denies    Pain Score 0-No pain    Pain Onset 1 to 4 weeks ago               LLE 2.5#, RLE 1#  Seated LAQ x 2 10 reps;  Seated marches x 2 10 reps Standing hip abduction x 2 10 reps x BUE support  Standing hamstring curls x 2 10 reps bilateral UE support  Heel raises with BUE support x 2 10 reps Mini squats with BUE support x 2 10 reps  TKE with green TB x20 each  Airex WBOS EO 30"x2, mod posterior  trunk lean Airex NBOS EO 30"x2, mod posterior trunk lean Airex beam side stepping x4 laps- light bilateral UE support Hurdles step over x10 with right, left x10, light single UE support, mod CGA  Hurdles side stepping x10 light single UE support, mod CGA                         PT Education - 01/27/21 1618    Education Details exercise technique    Person(s) Educated Patient    Methods Explanation    Comprehension Verbalized understanding            PT Short Term Goals - 01/09/21 1638      PT SHORT TERM GOAL #1   Title Patient will be independent in home exercise program to improve strength/mobility for better functional independence with ADLs and for self-management.    Time 6    Period Weeks    Status New    Target Date 02/20/21      PT SHORT TERM GOAL #2   Title Patient will be modified independent in walking on even/uneven surface with least restrictive assistive device, for 10+ minutes without rest break, reporting some difficulty or less to improve walking tolerance with community ambulation including grocery shopping, going to church,etc.    Baseline 02/03: instability, LOB multiple times while ambulating to elevator    Time 6    Period Weeks    Status New    Target Date 02/20/21      PT  SHORT TERM GOAL #3   Title Patient will increase ABC scale score >80% to demonstrate better functional mobility and better confidence with ADLs.    Baseline 01/09/21: 60.625%    Time 6    Period Weeks    Status New    Target Date 02/20/21             PT Long Term Goals - 01/09/21 1642      PT LONG TERM GOAL #1   Title Patient will increase FOTO score to equal to or greater than 65 to demonstrate statistically significant improvement in mobility and quality of life.    Baseline scored 41/100 on 05/21/2020; 7/22 40, 8/23: 60/100, 09/30/20=40, 01/09/21: 46    Time 12    Period Weeks    Status New    Target Date 04/03/21      PT LONG TERM GOAL #2   Title Patient will reduce falls risk as indicated by decreased TUG time to less than 11 seconds.    Baseline scored 37.9 sec with TUG on 05/21/2020; 21seconds with elevated rollator 7/22, 8/23: 13.62 sec with up and go walker, 01/09/21: 15.81s with hiking stick    Time 12    Period Weeks    Status New    Target Date 04/03/21      PT LONG TERM GOAL #3   Title Patient will increase right UE and LE gross strength to 4+/5 throughout to improve functional strength for independent gait, increased standing tolerance and increased ADL ability.    Baseline grossly +3/5 to -4/5 right UE and LE strength on 05/21/2020; grossly 4-/5 for RUE, grossly 4-/5 for RLE on 7/22, grossly 4-/5 RUE, grossly 4/5 RLE    Time 8    Period Weeks    Status Deferred      PT LONG TERM GOAL #4   Title Patient will increase 10 meter walk test to >1.32ms as to improve gait speed for better community ambulation  and to reduce fall risk.    Baseline on 7/22 .57 m/sself selected, fast  .73 m/s with LOB pt reported R knee buckling, elevated rollator used, 8/23: 1.06 m/s with up and go walker, 01/09/21: 0.72 m/s with hiking stick    Time 12    Period Weeks    Status New    Target Date 04/03/21      PT LONG TERM GOAL #5   Title Patient will increase Berg Balance score by >  6 points to demonstrate decreased fall risk during functional activities.    Baseline 8/23: 37/56, 09/30/20=40/56, 12/20: 42/56    Time 8    Period Weeks    Status Deferred      PT LONG TERM GOAL #6   Title Patient (< 71 years old) will complete five times sit to stand test in < 10 seconds indicating an increased LE strength and improved balance.    Baseline 8/23: 22 sec, 09/30/20=13.04 sec, 12/20: 27.05 sec, 01/09/21: 18.01s    Time 12    Period Weeks    Status Partially Met    Target Date 04/03/21      PT LONG TERM GOAL #7   Title Patient will improve 6 min walk test >1000 feet with LRAD for improved gait ability in community.    Baseline 8/23: not tested; 8/30 845, goal adjusted >2066f, 09/30/20=600 feet - stopped test due to R ankle catching making gait unsafe, 11/25/20= 330 feet - stopped test due to severe dizziness, vomiting/nausea, sweating making gait unsafe; 01/09/21: 720 ft with no rest breaks    Time 12    Period Weeks    Status On-going    Target Date 04/03/21                 Plan - 01/27/21 1620    Clinical Impression Statement The patient continues with intermittent buckling of right knee during weightbearing exercises, though this has improved since previous session.  The patient does continue to require UE support for balance exercises and demonstrates a moderate posterior lean on airex.  The patient continues to benefit from additional skilled PT services to improve LE strength and balance for decreased fall risk and improved quality of life.    Personal Factors and Comorbidities Comorbidity 3+;Time since onset of injury/illness/exacerbation    Comorbidities anxiety, back pain, COPD, headache, HTN, MI, sleep apnea, migraines    Examination-Activity Limitations Locomotion Level;Transfers;Bed Mobility;Stand;Stairs;Sleep;Squat;Bend    Examination-Participation Restrictions Driving;Medication Management    Stability/Clinical Decision Making Unstable/Unpredictable     Rehab Potential Fair    PT Frequency 2x / week    PT Duration 12 weeks    PT Treatment/Interventions ADLs/Self Care Home Management;Canalith Repostioning;Moist Heat;Electrical Stimulation;Gait training;Stair training;Functional mobility training;Therapeutic activities;Therapeutic exercise;Balance training;Neuromuscular re-education;Patient/family education;Manual techniques;Passive range of motion;Vestibular    PT Next Visit Plan update HEP ea session for continued strengthening/balanceing at home    PT HCenterportAccess Code: [email or web address] updated this session    Consulted and Agree with Plan of Care Patient           Patient will benefit from skilled therapeutic intervention in order to improve the following deficits and impairments:  Decreased activity tolerance,Decreased balance,Decreased cognition,Decreased mobility,Difficulty walking,Abnormal gait,Decreased range of motion,Dizziness,Pain,Impaired flexibility,Decreased strength,Impaired sensation,Decreased endurance,Decreased safety awareness  Visit Diagnosis: Muscle weakness (generalized)  Unsteadiness on feet  Dizziness and giddiness     Problem List Patient Active Problem List   Diagnosis Date Noted  . Degenerative spondylolisthesis 03/25/2020  . Physical  deconditioning 03/04/2020  . Lobar pneumonia, unspecified organism ([place]) 03/04/2020  . Intractable hemiplegic migraine with status migrainosus 03/02/2020  . Current tobacco use 02/05/2020  . Abnormal findings on diagnostic imaging of lung 02/05/2020  . Shortness of breath 02/05/2020  . Herniated disc, cervical 01/23/2020  . Cervical spondylosis with myelopathy and radiculopathy 01/23/2020  . Pre-operative respiratory examination 01/12/2020  . Weakness on right side of face 12/22/2019  . Osteoarthritis of spine with radiculopathy, lumbar region 12/11/2019  . Intractable migraine with aura with status migrainosus 11/09/2019  . Former smoker 08/27/2018   . Benign essential HTN 08/27/2018  . Cough productive of purulent sputum 11/08/2017  . GERD (gastroesophageal reflux disease) 05/06/2017  . Contusion of hand 04/22/2017  . BP (high blood pressure) 04/22/2017  . Oxygen desaturation 04/22/2017  . Prostate lump 04/22/2017  . Unstable angina ([place]) 04/27/2016  . Chest pain 04/24/2016  . Morbid obesity ([place]) 01/29/2016  . Elevated ALT measurement 10/30/2015  . Nonspecific elevation of levels of transaminase and lactic acid dehydrogenase (ldh) 10/30/2015  . Reduced libido 10/29/2015  . Hyperlipidemia 08/06/2015  . Anxiety 08/06/2015  . Atherosclerosis of coronary artery 08/06/2015  . Childhood asthma 08/06/2015  . Chronic obstructive pulmonary disease ([place]) 08/06/2015  . OSA (obstructive sleep apnea) 08/06/2015  . Anxiety disorder 08/06/2015  . Atherosclerosis of native coronary artery of native heart with stable angina pectoris ([place]) 08/06/2015  . Uncomplicated asthma 00/71/2197    Cory Espinoza PT,DPT 01/27/2021, 4:29 PM  [place] MAIN [facility] SERVICES [address] [place], [place], [address] Phone: [phone]   Fax:  [phone]  Name: Cory Espinoza MRN: [id number] Date of Birth: 04-Apr-1971

## 2021-01-30 ENCOUNTER — Ambulatory Visit: Payer: BC Managed Care – PPO

## 2021-02-03 ENCOUNTER — Ambulatory Visit: Payer: BC Managed Care – PPO

## 2021-02-03 ENCOUNTER — Other Ambulatory Visit: Payer: Self-pay

## 2021-02-03 DIAGNOSIS — R2681 Unsteadiness on feet: Secondary | ICD-10-CM

## 2021-02-03 DIAGNOSIS — M6281 Muscle weakness (generalized): Secondary | ICD-10-CM | POA: Diagnosis not present

## 2021-02-03 DIAGNOSIS — R42 Dizziness and giddiness: Secondary | ICD-10-CM

## 2021-02-03 NOTE — Therapy (Signed)
[place] MAIN [facility] SERVICES [address] [place], [place], [address] Phone: [phone]   Fax:  [phone]  Physical Therapy Treatment  Patient Details  Name: Cory Espinoza MRN: [id number] Date of Birth: Jan 07, 1971 Referring Provider (PT): Dr. Joselyn Arrow   Encounter Date: 02/03/2021   PT End of Session - 02/03/21 1514    Visit Number 29    Number of Visits 61    Date for PT Re-Evaluation 03/06/21    Authorization Type eval: 05/21/20    Authorization Time Period PT FOTO completed; FOTO performed 7/22 scored 40    PT Start Time 2010    PT Stop Time 1559    PT Time Calculation (min) 44 min    Equipment Utilized During Treatment Gait belt    Activity Tolerance Patient tolerated treatment well;No increased pain    Behavior During Therapy WFL for tasks assessed/performed           Past Medical History:  Diagnosis Date  . Allergy   . Anginal pain ([place])   . Anxiety   . Arthritis    lumbar spine  . Asthma   . Atrial fibrillation ([place])   . Back pain    Severe Lumbar Pain  . CHF (congestive heart failure) ([place])   . COPD (chronic obstructive pulmonary disease) ([facility])    Restrictive lung disease  . Coronary artery disease   . Dyspnea   . Dysrhythmia    afib  . GERD (gastroesophageal reflux disease)   . Headache    [name], onset October 2020, cluster headaches in the past  . History of kidney stones   . Hyperlipidemia   . Hypertension   . Myocardial infarction ([place])    at age 17  . Pneumonia   . Restrictive lung disease   . Sleep apnea    unable to use cpap since onset of migraines    Past Surgical History:  Procedure Laterality Date  . ABDOMINAL EXPOSURE N/A 03/25/2020   Procedure: ABDOMINAL EXPOSURE;  Surgeon: Rosetta Posner, MD;  Location: [facility];  Service: Vascular;  Laterality: N/A;  . ANTERIOR CERVICAL DECOMP/DISCECTOMY FUSION N/A 01/23/2020   Procedure: ANTERIOR CERVICAL DECOMPRESSION FUSION - CERVICAL  SIX-CERVICAL SEVEN;  Surgeon: Earnie Larsson, MD;  Location: [facility];  Service: Neurosurgery;  Laterality: N/A;  . ANTERIOR LUMBAR FUSION N/A 03/25/2020   Procedure: ANTERIOR LUMBAR INTERBODY FUSION LUMBAR FIVE-SACRAL ONE.;  Surgeon: Earnie Larsson, MD;  Location: [facility];  Service: Neurosurgery;  Laterality: N/A;  anterior  . BACK SURGERY  1996  . CARDIAC CATHETERIZATION Left 04/27/2016   Procedure: Left Heart Cath and Coronary Angiography;  Surgeon: Corey Skains, MD;  Location: [facility];  Service: Cardiovascular;  Laterality: Left;  . CARDIAC CATHETERIZATION N/A 04/27/2016   Procedure: Intravascular Pressure Wire/FFR Study;  Surgeon: Yolonda Kida, MD;  Location: [facility];  Service: Cardiovascular;  Laterality: N/A;  . CATHETERIZATION OF PULMONARY ARTERY WITH RETRIEVAL OF FOREIGN BODY Bilateral 04/07/2011   heart  . DEGENERATIVE SPONDYLOLISTHESIS    . KNEE ARTHROSCOPY Bilateral 2004  . [place] SURGERY  1999  . RADICULOPATHY, CERVICAL REGION    . TONSILLECTOMY      There were no vitals filed for this visit.   Subjective Assessment - 02/03/21 1516    Subjective Patient missed last session due to vertigo. Has been progressively worsening. Is bad today but didn't want to miss session.    Patient is accompained by: Family  member    Pertinent History Pt has a complex medical history. 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 He states that he fell 10-12 feet and struck his head, neck, and back. He was able to finish the course which took him approximately 45 minutes more to complete. He does endorse a second fall while finishing the course but did not suffer a second head injury. He states that he started getting headaches that day which have persisted since that time. 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 He does report improvement in his headaches recently.  Neurology was concerned about possible BPPV and have referred him for a vestibular evaluation. In addition since the injury, pt developed RUE and RLE pain and weakness. Pt states that NCV showed abnormal nerve conduction in RUE. He has since undergone a C7-7 ACDF on 01/23/20. He reports initial improvement in RUE strength, but states that he has a gradual return of his RUE weakness. He was also having significant RLE weakness and pain and underwent and L5-S1 anterior lumbar interbody fusion on 03/25/20. Patient reports that he does not have any back precautions but needs to wear the low back brace for one more month. Patient reports he has a follow-up appointment with the surgeon next month. Patient reports he has a follow-up appointment with Dr. Brigitte Pulse, neurologist, in August. He arrives to therapy ambulating with an upright rollator walker. Pt denies any mention of concussion after his injury. He has been having cognitive issues since his injury and has previously had a heavy metals screen as well as neurocognitive testing. He has had an MRI of his brain with and without contrast on 12/18/19. Results were mildly motion degraded examination. No evidence of acute intracranial abnormality. Minimal chronic small vessel ischemic disease. He has tremor in both his hands. He has a positive family history of Parkinson's disease in his grandfather. He is complaining of constant dizziness and unsteadiness which are worse with activity. Patient states that he frequently loses his balance and his wife has to steady him.    Limitations Lifting;Standing;Walking;House hold activities    Diagnostic tests He has had an MRI of his brain with and without contrast on 12/18/19. Results were mildly motion degraded examination. No evidence of acute intracranial abnormality. Minimal chronic small vessel ischemic disease.    Patient Stated Goals to be able to ambulate with a cane or no AD, to be able to drive, to be able to ride his  motorcycle    Currently in Pain? Yes    Pain Score 5     Pain Location Head    Pain Descriptors / Indicators Aching    Pain Type Acute pain    Pain Onset 1 to 4 weeks ago             TherEx: cues for sequencing, muscle activation technique, and body mechanics   LLE 2.5#, standing with UE support:  -standing marching 10x each LE stabilization to RLE for reduction of knee hyperextension provided -abduction; 10x each LE; stabilization to RLE for reduction of knee hyperextension provided -hamstring curl 12x each LE, cues for arc of motion heavy UE support required.    Seated 2.5#;  Seated LAQ 12 reps;  Seated marches x 12 reps; arms crossed for no core stabilization Seated IR/ER with green ball between knees for stabilization 12x each LE  Seated: GTB hamstring  curls 12x each LE ; very challenging RLE GTB alternating LAQ with band around ankles 10x each LE Small green ball between ankles: LAQ with adduction squeeze 10x    Neuro Re-ed: close CGA due to stability and safety bosu ball: round side up: lateral stepping 10x each LE Single limb stance stabilization provided to RLE for reduction of knee buckling 3x 30 second holds       Pt educated throughout session about proper posture and technique with exercises. Improved exercise technique, movement at target joints, use of target muscles after min to mod verbal, visual, tactile cues  Patient is highly motivated throughout session however required encouragement for rest breaks due to increased nausea throughout session. Emesis bag provided. Patient is limited in stabilization on RLE with excessive knee hyperextension with fatigue which will continue to be an area of focus. Patient educated on OT for UE and patient stated he would think about it. The patient continues to benefit from additional skilled PT services to improve LE strength and balance for decreased fall risk and improved quality of  life.                    PT Education - 02/03/21 1513    Education Details exercise technique, body mechanics    Person(s) Educated Patient    Methods Explanation;Demonstration;Tactile cues;Verbal cues    Comprehension Verbalized understanding;Returned demonstration;Verbal cues required;Tactile cues required            PT Short Term Goals - 01/09/21 1638      PT SHORT TERM GOAL #1   Title Patient will be independent in home exercise program to improve strength/mobility for better functional independence with ADLs and for self-management.    Time 6    Period Weeks    Status New    Target Date 02/20/21      PT SHORT TERM GOAL #2   Title Patient will be modified independent in walking on even/uneven surface with least restrictive assistive device, for 10+ minutes without rest break, reporting some difficulty or less to improve walking tolerance with community ambulation including grocery shopping, going to church,etc.    Baseline 02/03: instability, LOB multiple times while ambulating to elevator    Time 6    Period Weeks    Status New    Target Date 02/20/21      PT SHORT TERM GOAL #3   Title Patient will increase ABC scale score >80% to demonstrate better functional mobility and better confidence with ADLs.    Baseline 01/09/21: 60.625%    Time 6    Period Weeks    Status New    Target Date 02/20/21             PT Long Term Goals - 01/09/21 1642      PT LONG TERM GOAL #1   Title Patient will increase FOTO score to equal to or greater than 65 to demonstrate statistically significant improvement in mobility and quality of life.    Baseline scored 41/100 on 05/21/2020; 7/22 40, 8/23: 60/100, 09/30/20=40, 01/09/21: 46    Time 12    Period Weeks    Status New    Target Date 04/03/21      PT LONG TERM GOAL #2   Title Patient will reduce falls risk as indicated by decreased TUG time to less than 11 seconds.    Baseline scored 37.9 sec with TUG on  05/21/2020; 21seconds with elevated rollator 7/22, 8/23: 13.62 sec with up  and go walker, 01/09/21: 15.81s with hiking stick    Time 12    Period Weeks    Status New    Target Date 04/03/21      PT LONG TERM GOAL #3   Title Patient will increase right UE and LE gross strength to 4+/5 throughout to improve functional strength for independent gait, increased standing tolerance and increased ADL ability.    Baseline grossly +3/5 to -4/5 right UE and LE strength on 05/21/2020; grossly 4-/5 for RUE, grossly 4-/5 for RLE on 7/22, grossly 4-/5 RUE, grossly 4/5 RLE    Time 8    Period Weeks    Status Deferred      PT LONG TERM GOAL #4   Title Patient will increase 10 meter walk test to >1.46ms as to improve gait speed for better community ambulation and to reduce fall risk.    Baseline on 7/22 .57 m/sself selected, fast  .73 m/s with LOB pt reported R knee buckling, elevated rollator used, 8/23: 1.06 m/s with up and go walker, 01/09/21: 0.72 m/s with hiking stick    Time 12    Period Weeks    Status New    Target Date 04/03/21      PT LONG TERM GOAL #5   Title Patient will increase Berg Balance score by > 6 points to demonstrate decreased fall risk during functional activities.    Baseline 8/23: 37/56, 09/30/20=40/56, 12/20: 42/56    Time 8    Period Weeks    Status Deferred      PT LONG TERM GOAL #6   Title Patient (< 667years old) will complete five times sit to stand test in < 10 seconds indicating an increased LE strength and improved balance.    Baseline 8/23: 22 sec, 09/30/20=13.04 sec, 12/20: 27.05 sec, 01/09/21: 18.01s    Time 12    Period Weeks    Status Partially Met    Target Date 04/03/21      PT LONG TERM GOAL #7   Title Patient will improve 6 min walk test >1000 feet with LRAD for improved gait ability in community.    Baseline 8/23: not tested; 8/30 845, goal adjusted >20034f 09/30/20=600 feet - stopped test due to R ankle catching making gait unsafe, 11/25/20= 330 feet -  stopped test due to severe dizziness, vomiting/nausea, sweating making gait unsafe; 01/09/21: 720 ft with no rest breaks    Time 12    Period Weeks    Status On-going    Target Date 04/03/21                 Plan - 02/03/21 1800    Clinical Impression Statement Patient is highly motivated throughout session however required encouragement for rest breaks due to increased nausea throughout session. Emesis bag provided. Patient is limited in stabilization on RLE with excessive knee hyperextension with fatigue which will continue to be an area of focus. Patient educated on OT for UE and patient stated he would think about it. The patient continues to benefit from additional skilled PT services to improve LE strength and balance for decreased fall risk and improved quality of life.    Personal Factors and Comorbidities Comorbidity 3+;Time since onset of injury/illness/exacerbation    Comorbidities anxiety, back pain, COPD, headache, HTN, MI, sleep apnea, migraines    Examination-Activity Limitations Locomotion Level;Transfers;Bed Mobility;Stand;Stairs;Sleep;Squat;Bend    Examination-Participation Restrictions Driving;Medication Management    Stability/Clinical Decision Making Unstable/Unpredictable    Rehab Potential Fair  PT Frequency 2x / week    PT Duration 12 weeks    PT Treatment/Interventions ADLs/Self Care Home Management;Canalith Repostioning;Moist Heat;Electrical Stimulation;Gait training;Stair training;Functional mobility training;Therapeutic activities;Therapeutic exercise;Balance training;Neuromuscular re-education;Patient/family education;Manual techniques;Passive range of motion;Vestibular    PT Next Visit Plan update HEP ea session for continued strengthening/balanceing at home    PT [place] Access Code: [email or web address] updated this session    Consulted and Agree with Plan of Care Patient           Patient will benefit from skilled therapeutic intervention  in order to improve the following deficits and impairments:  Decreased activity tolerance,Decreased balance,Decreased cognition,Decreased mobility,Difficulty walking,Abnormal gait,Decreased range of motion,Dizziness,Pain,Impaired flexibility,Decreased strength,Impaired sensation,Decreased endurance,Decreased safety awareness  Visit Diagnosis: Muscle weakness (generalized)  Unsteadiness on feet  Dizziness and giddiness     Problem List Patient Active Problem List   Diagnosis Date Noted  . Degenerative spondylolisthesis 03/25/2020  . Physical deconditioning 03/04/2020  . Lobar pneumonia, unspecified organism ([place]) 03/04/2020  . Intractable hemiplegic migraine with status migrainosus 03/02/2020  . Current tobacco use 02/05/2020  . Abnormal findings on diagnostic imaging of lung 02/05/2020  . Shortness of breath 02/05/2020  . Herniated disc, cervical 01/23/2020  . Cervical spondylosis with myelopathy and radiculopathy 01/23/2020  . Pre-operative respiratory examination 01/12/2020  . Weakness on right side of face 12/22/2019  . Osteoarthritis of spine with radiculopathy, lumbar region 12/11/2019  . Intractable migraine with aura with status migrainosus 11/09/2019  . Former smoker 08/27/2018  . Benign essential HTN 08/27/2018  . Cough productive of purulent sputum 11/08/2017  . GERD (gastroesophageal reflux disease) 05/06/2017  . Contusion of hand 04/22/2017  . BP (high blood pressure) 04/22/2017  . Oxygen desaturation 04/22/2017  . Prostate lump 04/22/2017  . Unstable angina ([place]) 04/27/2016  . Chest pain 04/24/2016  . Morbid obesity ([place]) 01/29/2016  . Elevated ALT measurement 10/30/2015  . Nonspecific elevation of levels of transaminase and lactic acid dehydrogenase (ldh) 10/30/2015  . Reduced libido 10/29/2015  . Hyperlipidemia 08/06/2015  . Anxiety 08/06/2015  . Atherosclerosis of coronary artery 08/06/2015  . Childhood asthma 08/06/2015  . Chronic obstructive pulmonary  disease ([place]) 08/06/2015  . OSA (obstructive sleep apnea) 08/06/2015  . Anxiety disorder 08/06/2015  . Atherosclerosis of native coronary artery of native heart with stable angina pectoris ([place]) 08/06/2015  . Uncomplicated asthma 65/02/5464   Janna Arch, PT, DPT   02/03/2021, 6:01 PM  [place] MAIN [facility] SERVICES [address] [place], [place], [address] Phone: [phone]   Fax:  [phone]  Name: TRAVONTAE FREIBERGER MRN: [id number] Date of Birth: 1971/04/15

## 2021-02-04 ENCOUNTER — Ambulatory Visit: Payer: Self-pay

## 2021-02-04 DIAGNOSIS — G4733 Obstructive sleep apnea (adult) (pediatric): Secondary | ICD-10-CM

## 2021-02-05 DIAGNOSIS — G4733 Obstructive sleep apnea (adult) (pediatric): Secondary | ICD-10-CM

## 2021-02-06 ENCOUNTER — Other Ambulatory Visit: Payer: Self-pay

## 2021-02-06 ENCOUNTER — Ambulatory Visit: Payer: BC Managed Care – PPO | Attending: Neurology

## 2021-02-06 DIAGNOSIS — R2681 Unsteadiness on feet: Secondary | ICD-10-CM | POA: Insufficient documentation

## 2021-02-06 DIAGNOSIS — M6281 Muscle weakness (generalized): Secondary | ICD-10-CM

## 2021-02-06 DIAGNOSIS — R42 Dizziness and giddiness: Secondary | ICD-10-CM

## 2021-02-06 NOTE — Therapy (Signed)
[place] MAIN [facility] SERVICES [address] [place], [place], [address] Phone: [phone]   Fax:  [phone]  Physical Therapy Treatment Physical Therapy Progress Note   Dates of reporting period  09/30/20   to   02/06/21   Patient Details  Name: Cory Espinoza MRN: [id number] Date of Birth: [birth year] Referring Provider (PT): Dr. Joselyn Arrow   Encounter Date: 02/06/2021   PT End of Session - 02/06/21 1630    Visit Number 30    Number of Visits 49    Date for PT Re-Evaluation 03/06/21    Authorization Type eval: 05/21/20    Authorization Time Period next session PN 1/10 PN 02/06/21    PT Start Time 1515    PT Stop Time 1559    PT Time Calculation (min) 44 min    Equipment Utilized During Treatment Gait belt    Activity Tolerance Patient tolerated treatment well;No increased pain    Behavior During Therapy WFL for tasks assessed/performed           Past Medical History:  Diagnosis Date  . Allergy   . Anginal pain ([place])   . Anxiety   . Arthritis    lumbar spine  . Asthma   . Atrial fibrillation ([place])   . Back pain    Severe Lumbar Pain  . CHF (congestive heart failure) ([place])   . COPD (chronic obstructive pulmonary disease) ([facility])    Restrictive lung disease  . Coronary artery disease   . Dyspnea   . Dysrhythmia    afib  . GERD (gastroesophageal reflux disease)   . Headache    [name], onset October 2020, cluster headaches in the past  . History of kidney stones   . Hyperlipidemia   . Hypertension   . Myocardial infarction ([place])    at age 58  . Pneumonia   . Restrictive lung disease   . Sleep apnea    unable to use cpap since onset of migraines    Past Surgical History:  Procedure Laterality Date  . ABDOMINAL EXPOSURE N/A 03/25/2020   Procedure: ABDOMINAL EXPOSURE;  Surgeon: Rosetta Posner, MD;  Location: [facility] OR;  Service: Vascular;  Laterality: N/A;  . ANTERIOR CERVICAL DECOMP/DISCECTOMY FUSION N/A 01/23/2020    Procedure: ANTERIOR CERVICAL DECOMPRESSION FUSION - CERVICAL SIX-CERVICAL SEVEN;  Surgeon: Earnie Larsson, MD;  Location: [place];  Service: Neurosurgery;  Laterality: N/A;  . ANTERIOR LUMBAR FUSION N/A 03/25/2020   Procedure: ANTERIOR LUMBAR INTERBODY FUSION LUMBAR FIVE-SACRAL ONE.;  Surgeon: Earnie Larsson, MD;  Location: [place];  Service: Neurosurgery;  Laterality: N/A;  anterior  . BACK SURGERY  1996  . CARDIAC CATHETERIZATION Left 04/27/2016   Procedure: Left Heart Cath and Coronary Angiography;  Surgeon: Corey Skains, MD;  Location: [place] CV LAB;  Service: Cardiovascular;  Laterality: Left;  . CARDIAC CATHETERIZATION N/A 04/27/2016   Procedure: Intravascular Pressure Wire/FFR Study;  Surgeon: Yolonda Kida, MD;  Location: [place] CV LAB;  Service: Cardiovascular;  Laterality: N/A;  . CATHETERIZATION OF PULMONARY ARTERY WITH RETRIEVAL OF FOREIGN BODY Bilateral 04/07/2011   heart  . DEGENERATIVE SPONDYLOLISTHESIS    . KNEE ARTHROSCOPY Bilateral 2004  . [place] SURGERY  1999  . RADICULOPATHY, CERVICAL REGION    . TONSILLECTOMY      There were no vitals filed for this visit.   Subjective Assessment - 02/06/21 1619    Subjective Patient fell off the bed almost hitting his dog. Needed help  to get up. Had nausea after last session but is pleased with amount of soreness.    Patient is accompained by: Family member    Pertinent History Pt has a complex medical history. 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 He states that he fell 10-12 feet and struck his head, neck, and back. He was able to finish the course which took him approximately 45 minutes more to complete. He does endorse a second fall while finishing the course but did not suffer a second head injury. He states that he started getting headaches that day which have persisted since that time. He is currently under the care of neurology who is treating him for hemiplegic migraines and  R occipital neuralgia. He does report improvement in his headaches recently. Neurology was concerned about possible BPPV and have referred him for a vestibular evaluation. In addition since the injury, pt developed RUE and RLE pain and weakness. Pt states that NCV showed abnormal nerve conduction in RUE. He has since undergone a C7-7 ACDF on 01/23/20. He reports initial improvement in RUE strength, but states that he has a gradual return of his RUE weakness. He was also having significant RLE weakness and pain and underwent and L5-S1 anterior lumbar interbody fusion on 03/25/20. Patient reports that he does not have any back precautions but needs to wear the low back brace for one more month. Patient reports he has a follow-up appointment with the surgeon next month. Patient reports he has a follow-up appointment with Dr. Brigitte Pulse, neurologist, in August. He arrives to therapy ambulating with an upright rollator walker. Pt denies any mention of concussion after his injury. He has been having cognitive issues since his injury and has previously had a heavy metals screen as well as neurocognitive testing. He has had an MRI of his brain with and without contrast on 12/18/19. Results were mildly motion degraded examination. No evidence of acute intracranial abnormality. Minimal chronic small vessel ischemic disease. He has tremor in both his hands. He has a positive family history of Parkinson's disease in his grandfather. He is complaining of constant dizziness and unsteadiness which are worse with activity. Patient states that he frequently loses his balance and his wife has to steady him.    Limitations Lifting;Standing;Walking;House hold activities    Diagnostic tests He has had an MRI of his brain with and without contrast on 12/18/19. Results were mildly motion degraded examination. No evidence of acute intracranial abnormality. Minimal chronic small vessel ischemic disease.    Patient Stated Goals to be able to  ambulate with a cane or no AD, to be able to drive, to be able to ride his motorcycle    Currently in Pain? Yes    Pain Score 5     Pain Location Head    Pain Onset 1 to 4 weeks ago             Patient fell off the bed almost hitting his dog. Needed help to get up.     Progress note FOTO: 40.3 %  TUG: 17.98  ABC= 50%  RUE and LE strength  Right Left  Hip flexion 4 4  Hip Abduction 4- 4  Hip Adduction 3+ 4-  Knee Extension  4 4  Knee Flexion 4 4  DF  4  PF  4     10 MWT: 13.55 with staff=0.74 m/s  BERG-deferred due to nausea  5x STS: 23  no UE support  6 minute walk test   Treatment:   - Standing marching 2.5# BLE with BUE support, x10ea - Squat STS with BUE support, x10 - Seated LAQ 2.5# BLE, x10ea - Bil LAQ with green ball ADD between feet, x10     Patient's condition has the potential to improve in response to therapy. Maximum improvement is yet to be obtained. The anticipated improvement is attainable and reasonable in a generally predictable time.  Patient reports he is not where he wants to be yet but is improving, no longer on a walker. Wants to be more independent.   Patient showed some progress in goals this session with decreased need for UE support with transfers. He does show slight plateau in ambulation however this is partially due to increased nausea and fatigue from previous session. He remains highly motivated to increase his independence. TUG test was limited by unsteadiness and vertigo with turn.  Patient's condition has the potential to improve in response to therapy. Maximum improvement is yet to be obtained. The anticipated improvement is attainable and reasonable in a generally predictable time. The patient continues to benefit from additional skilled PT services to improve LE strength and balance for decreased fall risk and improved quality of life.             PT Education - 02/06/21 1627    Education Details goals, progress note.     Person(s) Educated Patient    Methods Explanation;Demonstration;Tactile cues;Verbal cues    Comprehension Verbalized understanding;Returned demonstration;Verbal cues required;Tactile cues required            PT Short Term Goals - 02/06/21 1617      PT SHORT TERM GOAL #1   Title Patient will be independent in home exercise program to improve strength/mobility for better functional independence with ADLs and for self-management.    Baseline 3/3 HEP compliant    Time 6    Period Weeks    Status Partially Met    Target Date 02/20/21      PT SHORT TERM GOAL #2   Title Patient will be modified independent in walking on even/uneven surface with least restrictive assistive device, for 10+ minutes without rest break, reporting some difficulty or less to improve walking tolerance with community ambulation including grocery shopping, going to church,etc.    Baseline 02/03: instability, LOB multiple times while ambulating to elevator    Time 6    Period Weeks    Status On-going    Target Date 02/20/21      PT SHORT TERM GOAL #3   Title Patient will increase ABC scale score >80% to demonstrate better functional mobility and better confidence with ADLs.    Baseline 01/09/21: 60.625% 3/3/: 50%    Time 6    Period Weeks    Status Partially Met    Target Date 02/20/21             PT Long Term Goals - 02/06/21 1539      PT LONG TERM GOAL #1   Title Patient will increase FOTO score to equal to or greater than 65 to demonstrate statistically significant improvement in mobility and quality of life.    Baseline scored 41/100 on 05/21/2020; 7/22 40, 8/23: 60/100, 09/30/20=40, 01/09/21: 46 3/3: 40.3%    Time 12    Period Weeks    Status Partially Met    Target Date 04/03/21      PT LONG TERM GOAL #2   Title Patient  will reduce falls risk as indicated by decreased TUG time to less than 11 seconds.    Baseline scored 37.9 sec with TUG on 05/21/2020; 21seconds with elevated rollator 7/22,  8/23: 13.62 sec with up and go walker, 01/09/21: 15.81s with hiking stick 3/3: 17.98 seconds one near LOB    Time 12    Period Weeks    Status Partially Met    Target Date 04/03/21      PT LONG TERM GOAL #3   Title Patient will increase right UE and LE gross strength to 4+/5 throughout to improve functional strength for independent gait, increased standing tolerance and increased ADL ability.    Baseline grossly +3/5 to -4/5 right UE and LE strength on 05/21/2020; grossly 4-/5 for RUE, grossly 4-/5 for RLE on 7/22, grossly 4-/5 RUE, grossly 4/5 RLE 3/3: see note    Time 8    Period Weeks    Status Partially Met    Target Date 04/03/21      PT LONG TERM GOAL #4   Title Patient will increase 10 meter walk test to >1.39ms as to improve gait speed for better community ambulation and to reduce fall risk.    Baseline on 7/22 .57 m/sself selected, fast  .73 m/s with LOB pt reported R knee buckling, elevated rollator used, 8/23: 1.06 m/s with up and go walker, 01/09/21: 0.72 m/s with hiking stick 3/3: 0.74 m/s with walking stick    Time 12    Period Weeks    Status Partially Met    Target Date 04/03/21      PT LONG TERM GOAL #5   Title Patient will increase Berg Balance score by > 6 points to demonstrate decreased fall risk during functional activities.    Baseline 8/23: 37/56, 09/30/20=40/56, 12/20: 42/56 3/3: deferred    Time 8    Period Weeks    Status Deferred      PT LONG TERM GOAL #6   Title Patient (< 666years old) will complete five times sit to stand test in < 10 seconds indicating an increased LE strength and improved balance.    Baseline 8/23: 22 sec, 09/30/20=13.04 sec, 12/20: 27.05 sec, 01/09/21: 18.01s 3/3: 23 seconds no UE support    Time 12    Period Weeks    Status Partially Met    Target Date 04/03/21      PT LONG TERM GOAL #7   Title Patient will improve 6 min walk test >1000 feet with LRAD for improved gait ability in community.    Baseline 8/23: not tested; 8/30  845, goal adjusted >20037f 09/30/20=600 feet - stopped test due to R ankle catching making gait unsafe, 11/25/20= 330 feet - stopped test due to severe dizziness, vomiting/nausea, sweating making gait unsafe; 01/09/21: 720 ft with no rest breaks 3/3: deferred due to nausea    Time 12    Period Weeks    Status Deferred                 Plan - 02/06/21 1619    Clinical Impression Statement Patient showed some progress in goals this session with decreased need for UE support with transfers. He does show slight plateau in ambulation however this is partially due to increased nausea and fatigue from previous session. He remains highly motivated to increase his independence. TUG test was limited by unsteadiness and vertigo with turn.  Patient's condition has the potential to improve in response to therapy. Maximum improvement is yet  to be obtained. The anticipated improvement is attainable and reasonable in a generally predictable time. The patient continues to benefit from additional skilled PT services to improve LE strength and balance for decreased fall risk and improved quality of life    Personal Factors and Comorbidities Comorbidity 3+;Time since onset of injury/illness/exacerbation    Comorbidities anxiety, back pain, COPD, headache, HTN, MI, sleep apnea, migraines    Examination-Activity Limitations Locomotion Level;Transfers;Bed Mobility;Stand;Stairs;Sleep;Squat;Bend    Examination-Participation Restrictions Driving;Medication Management    Stability/Clinical Decision Making Unstable/Unpredictable    Rehab Potential Fair    PT Frequency 2x / week    PT Duration 12 weeks    PT Treatment/Interventions ADLs/Self Care Home Management;Canalith Repostioning;Moist Heat;Electrical Stimulation;Gait training;Stair training;Functional mobility training;Therapeutic activities;Therapeutic exercise;Balance training;Neuromuscular re-education;Patient/family education;Manual techniques;Passive range of  motion;Vestibular    PT Next Visit Plan update HEP ea session for continued strengthening/balanceing at home    PT [place] Access Code: [email or web address] updated this session    Consulted and Agree with Plan of Care Patient           Patient will benefit from skilled therapeutic intervention in order to improve the following deficits and impairments:  Decreased activity tolerance,Decreased balance,Decreased cognition,Decreased mobility,Difficulty walking,Abnormal gait,Decreased range of motion,Dizziness,Pain,Impaired flexibility,Decreased strength,Impaired sensation,Decreased endurance,Decreased safety awareness  Visit Diagnosis: Muscle weakness (generalized)  Unsteadiness on feet  Dizziness and giddiness     Problem List Patient Active Problem List   Diagnosis Date Noted  . Degenerative spondylolisthesis 03/25/2020  . Physical deconditioning 03/04/2020  . Lobar pneumonia, unspecified organism ([place]) 03/04/2020  . Intractable hemiplegic migraine with status migrainosus 03/02/2020  . Current tobacco use 02/05/2020  . Abnormal findings on diagnostic imaging of lung 02/05/2020  . Shortness of breath 02/05/2020  . Herniated disc, cervical 01/23/2020  . Cervical spondylosis with myelopathy and radiculopathy 01/23/2020  . Pre-operative respiratory examination 01/12/2020  . Weakness on right side of face 12/22/2019  . Osteoarthritis of spine with radiculopathy, lumbar region 12/11/2019  . Intractable migraine with aura with status migrainosus 11/09/2019  . Former smoker 08/27/2018  . Benign essential HTN 08/27/2018  . Cough productive of purulent sputum 11/08/2017  . GERD (gastroesophageal reflux disease) 05/06/2017  . Contusion of hand 04/22/2017  . BP (high blood pressure) 04/22/2017  . Oxygen desaturation 04/22/2017  . Prostate lump 04/22/2017  . Unstable angina ([place]) 04/27/2016  . Chest pain 04/24/2016  . Morbid obesity ([place]) 01/29/2016  . Elevated ALT  measurement 10/30/2015  . Nonspecific elevation of levels of transaminase and lactic acid dehydrogenase (ldh) 10/30/2015  . Reduced libido 10/29/2015  . Hyperlipidemia 08/06/2015  . Anxiety 08/06/2015  . Atherosclerosis of coronary artery 08/06/2015  . Childhood asthma 08/06/2015  . Chronic obstructive pulmonary disease ([place]) 08/06/2015  . OSA (obstructive sleep apnea) 08/06/2015  . Anxiety disorder 08/06/2015  . Atherosclerosis of native coronary artery of native heart with stable angina pectoris ([place]) 08/06/2015  . Uncomplicated asthma 81/82/9937   Janna Arch, PT, DPT   02/06/2021, 4:34 PM  [place] MAIN [facility] SERVICES [address] [place], [place], [address] Phone: [phone]   Fax:  [phone]  Name: KENSTON LONGTON MRN: [id number] Date of Birth: [birth year]

## 2021-02-07 ENCOUNTER — Telehealth: Payer: Self-pay | Admitting: Adult Health

## 2021-02-07 NOTE — Telephone Encounter (Signed)
Home sleep study completed on February 04, 2021 shows mild obstructive sleep apnea with AHI at 7.6/hour SPO2 low at 84%.  Please contact patient and set up a telemedicine or in person visit to discuss his sleep study results and treatment options

## 2021-02-10 ENCOUNTER — Ambulatory Visit: Payer: BC Managed Care – PPO

## 2021-02-11 ENCOUNTER — Telehealth: Payer: Self-pay | Admitting: *Deleted

## 2021-02-11 NOTE — Telephone Encounter (Signed)
Called and spoke with patient and his wife, Cory Espinoza, regarding HST results.  Scheduled for a televisit with Rubye Oaks NP on Friday 02/14/21 to discuss the results.  They verbalized understanding.  Patient's wife stated that the reason for the HST was for him to get the aspire device.  She requested that the HST results be sent to Dr. Thurmon Fair office.  Tammy, Please advise if ok to send HST results to Dr. Thurmon Fair office.  Thank you.

## 2021-02-11 NOTE — Telephone Encounter (Signed)
Called and spoke with patient and his wife, Chaya Jan, advised of HST results.  They verbalized understanding.  Advised of need for either an office or televisit to discuss HST results and treatment.  Patient wife stated that the results needed to be sent to Dr. Thurmon Fair office because the sleep study was done d/t wanting to get the aspire device.  I let her know I would make Tammy aware.  Patient scheduled for a televisit on Friday 02/14/21.  Tammy, Please advise if ok to fax sleep study to Dr. Thurmon Fair office.  Thank you.

## 2021-02-11 NOTE — Telephone Encounter (Signed)
HST faxed to Dr Thurmon Fair office. Called and left detailed msg on machine for the pt letting him know that this was done.

## 2021-02-11 NOTE — Telephone Encounter (Signed)
Yes of course

## 2021-02-13 ENCOUNTER — Ambulatory Visit: Payer: BC Managed Care – PPO

## 2021-02-14 ENCOUNTER — Other Ambulatory Visit: Payer: Self-pay

## 2021-02-14 ENCOUNTER — Ambulatory Visit (INDEPENDENT_AMBULATORY_CARE_PROVIDER_SITE_OTHER): Payer: BC Managed Care – PPO | Admitting: Adult Health

## 2021-02-14 DIAGNOSIS — G4733 Obstructive sleep apnea (adult) (pediatric): Secondary | ICD-10-CM | POA: Diagnosis not present

## 2021-02-14 NOTE — Progress Notes (Signed)
Virtual Visit via Telephone Note  I connected with [name] on 02/14/21 at 10:00 AM EST by telephone and verified that I am speaking with the correct person using two identifiers.  Location: Patient: Home  Provider: Office   I discussed the limitations, risks, security and privacy concerns of performing an evaluation and management service by telephone and the availability of in person appointments. I also discussed with the patient that there may be a patient responsible charge related to this service. The patient expressed understanding and agreed to proceed.   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  Cervical spondylosis with myelopathy and radiculopathy status post C6 and 7 with ACDF surgery  Today's telemedicine visit is a follow-up from home sleep study.  Patient has a history of sleep apnea previously prior to his traumatic injury and August 2020 patient was very compliant on nocturnal CPAP.  Since his accident he has had chronic debilitating migraines and pain.  He has been unable to wear his CPAP.  He continues to have ongoing daytime sleepiness that is affecting his daytime routine.  Patient has tried several different CPAP mask and different pressures to try to help with his CPAP usage.  However has been unable to wear his CPAP.  We discussed referral to local ENT to discuss INSPIRE device.  Patient was recommended for a repeat sleep study.  Home sleep study was completed February 04, 2021 that showed mild obstructive sleep apnea with AHI at 7.6/hour with SPO2 low at 84%, average O2 saturation was 93%.  We discussed these test results.  Went over that he has  mild sleep apnea.  We went over treatment options including weight loss, oral appliance, CPAP, surgical intervention.  He would like to proceed with evaluation for oral appliance.    Observations/Objective:  12/2017- Small airways obstruction with restrictive lung disease from increased weight FEF 25/75 reduced to 66% predicted Fev1/FVC ratio WNL FVC reduced TLC reduced DLCO NL when corrected for volumes  Imaging: CT chest 3.29.19- persistent b/l infiltrates R>L Chest x-ray April 2021 clear lungs  02/07/2020-CTA chest-negative for PE, patchy airspace disease in posterior right upper lobe and right lower lobes of the appearance most worrisome for pneumonia  Assessment and Plan: Mild obstructive sleep apnea.  Recent sleep study shows mild disease with AHI at 7.6/hour.  With average O2 saturation at 93%.  SPO2 low at 84%. Patient education on sleep apnea and treatment options discussed.  Patient would like to proceed with evaluation for an oral appliance.  Plan  Patient Instructions  Refer to Dr. Irene Limbo for evaluation of oral appliane ([phone])  Work on healthy weight loss Avoid sedating medications Do not drive if sleepy.  Follow-up with Dr. Belia Heman  in 6 months and as needed     Follow Up Instructions: Follow up in 6 months and ,As needed    I discussed the assessment and treatment plan with the patient. The patient was provided an opportunity to ask questions and all were answered. The patient agreed with the plan and demonstrated an understanding of the instructions.   The patient was advised to call back or seek an in-person evaluation if the symptoms worsen or if the condition fails to improve as anticipated.  I provided  22  minutes of non-face-to-face time during this encounter.   Rexene Edison, NP

## 2021-02-14 NOTE — Patient Instructions (Signed)
Refer to Dr. Irene Limbo for evaluation of oral appliane ([phone])  Work on healthy weight loss Avoid sedating medications Do not drive if sleepy.  Follow-up with Dr. Belia Heman  in 6 months and as needed

## 2021-02-17 ENCOUNTER — Other Ambulatory Visit: Payer: Self-pay

## 2021-02-17 ENCOUNTER — Ambulatory Visit: Payer: BC Managed Care – PPO

## 2021-02-17 DIAGNOSIS — R42 Dizziness and giddiness: Secondary | ICD-10-CM

## 2021-02-17 DIAGNOSIS — M6281 Muscle weakness (generalized): Secondary | ICD-10-CM

## 2021-02-17 DIAGNOSIS — R2681 Unsteadiness on feet: Secondary | ICD-10-CM

## 2021-02-17 NOTE — Therapy (Signed)
[place] MAIN [facility] SERVICES [address] [place], [place], [address] Phone: [phone]   Fax:  [phone]  Physical Therapy Treatment  Patient Details  Name: Cory Espinoza MRN: [id number] Date of Birth: [birth year] Referring Provider (PT): Dr. Joselyn Arrow   Encounter Date: 02/17/2021   PT End of Session - 02/17/21 1605    Visit Number 31    Number of Visits 55    Date for PT Re-Evaluation 03/06/21    Authorization Type eval: 05/21/20    Authorization Time Period next session PN 1/10 PN 02/06/21    PT Start Time 1515    PT Stop Time 1600    PT Time Calculation (min) 45 min    Equipment Utilized During Treatment Gait belt    Activity Tolerance Patient tolerated treatment well;No increased pain    Behavior During Therapy WFL for tasks assessed/performed           Past Medical History:  Diagnosis Date  . Allergy   . Anginal pain ([place])   . Anxiety   . Arthritis    lumbar spine  . Asthma   . Atrial fibrillation ([place])   . Back pain    Severe Lumbar Pain  . CHF (congestive heart failure) ([place])   . COPD (chronic obstructive pulmonary disease) ([facility])    Restrictive lung disease  . Coronary artery disease   . Dyspnea   . Dysrhythmia    afib  . GERD (gastroesophageal reflux disease)   . Headache    [name], onset October 2020, cluster headaches in the past  . History of kidney stones   . Hyperlipidemia   . Hypertension   . Myocardial infarction ([place])    at age 50  . Pneumonia   . Restrictive lung disease   . Sleep apnea    unable to use cpap since onset of migraines    Past Surgical History:  Procedure Laterality Date  . ABDOMINAL EXPOSURE N/A 03/25/2020   Procedure: ABDOMINAL EXPOSURE;  Surgeon: Rosetta Posner, MD;  Location: [facility] OR;  Service: Vascular;  Laterality: N/A;  . ANTERIOR CERVICAL DECOMP/DISCECTOMY FUSION N/A 01/23/2020   Procedure: ANTERIOR CERVICAL DECOMPRESSION FUSION - CERVICAL SIX-CERVICAL SEVEN;   Surgeon: Earnie Larsson, MD;  Location: [place];  Service: Neurosurgery;  Laterality: N/A;  . ANTERIOR LUMBAR FUSION N/A 03/25/2020   Procedure: ANTERIOR LUMBAR INTERBODY FUSION LUMBAR FIVE-SACRAL ONE.;  Surgeon: Earnie Larsson, MD;  Location: [place];  Service: Neurosurgery;  Laterality: N/A;  anterior  . BACK SURGERY  1996  . CARDIAC CATHETERIZATION Left 04/27/2016   Procedure: Left Heart Cath and Coronary Angiography;  Surgeon: Corey Skains, MD;  Location: [place] CV LAB;  Service: Cardiovascular;  Laterality: Left;  . CARDIAC CATHETERIZATION N/A 04/27/2016   Procedure: Intravascular Pressure Wire/FFR Study;  Surgeon: Yolonda Kida, MD;  Location: [place] CV LAB;  Service: Cardiovascular;  Laterality: N/A;  . CATHETERIZATION OF PULMONARY ARTERY WITH RETRIEVAL OF FOREIGN BODY Bilateral 04/07/2011   heart  . DEGENERATIVE SPONDYLOLISTHESIS    . KNEE ARTHROSCOPY Bilateral 2004  . [place] SURGERY  1999  . RADICULOPATHY, CERVICAL REGION    . TONSILLECTOMY      There were no vitals filed for this visit.   Subjective Assessment - 02/17/21 1603    Subjective Patient missed last session due to severe nausea/headaches.    Patient is accompained by: Family member    Pertinent History Pt has a complex medical history. 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 He states that he fell 10-12 feet and struck his head, neck, and back. He was able to finish the course which took him approximately 45 minutes more to complete. He does endorse a second fall while finishing the course but did not suffer a second head injury. He states that he started getting headaches that day which have persisted since that time. 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 He does report improvement in his headaches recently. Neurology was concerned about possible BPPV and have referred him for a vestibular  evaluation. In addition since the injury, pt developed RUE and RLE pain and weakness. Pt states that NCV showed abnormal nerve conduction in RUE. He has since undergone a C7-7 ACDF on 01/23/20. He reports initial improvement in RUE strength, but states that he has a gradual return of his RUE weakness. He was also having significant RLE weakness and pain and underwent and L5-S1 anterior lumbar interbody fusion on 03/25/20. Patient reports that he does not have any back precautions but needs to wear the low back brace for one more month. Patient reports he has a follow-up appointment with the surgeon next month. Patient reports he has a follow-up appointment with Dr. Brigitte Pulse, neurologist, in August. He arrives to therapy ambulating with an upright rollator walker. Pt denies any mention of concussion after his injury. He has been having cognitive issues since his injury and has previously had a heavy metals screen as well as neurocognitive testing. He has had an MRI of his brain with and without contrast on 12/18/19. Results were mildly motion degraded examination. No evidence of acute intracranial abnormality. Minimal chronic small vessel ischemic disease. He has tremor in both his hands. He has a positive family history of Parkinson's disease in his grandfather. He is complaining of constant dizziness and unsteadiness which are worse with activity. Patient states that he frequently loses his balance and his wife has to steady him.    Limitations Lifting;Standing;Walking;House hold activities    Diagnostic tests He has had an MRI of his brain with and without contrast on 12/18/19. Results were mildly motion degraded examination. No evidence of acute intracranial abnormality. Minimal chronic small vessel ischemic disease.    Patient Stated Goals to be able to ambulate with a cane or no AD, to be able to drive, to be able to ride his motorcycle    Currently in Pain? No/denies                 TherEx: cues for  sequencing, muscle activation technique, and body mechanics    LLE 2.5#, standing with UE support:  -standing marching 10x each LE stabilization to RLE for reduction of knee hyperextension provided -abduction; 10x each LE; stabilization to RLE for reduction of knee hyperextension provided -hamstring curl 12x each LE, cues for arc of motion heavy UE support required.    Seated 2.5#;  Seated LAQ 12 reps;  Seated marches x 12 reps; arms crossed for no core stabilization Seated IR/ER with green ball between knees for stabilization 12x each LE Seated heel raise 15x   Seated: GTB hamstring curls 12x each LE ; very challenging RLE GTB alternating LAQ with band around ankles 10x each LE Small green ball between ankles: LAQ with adduction squeeze 10x    Neuro Re-ed: close CGA due to stability and safety Static stand  with upright posture x60 seconds Sit to stand with UE support on bar, 10x Single limb stance stabilization provided to RLE for reduction of knee buckling 3x 30 second holds     Pt educated throughout session about proper posture and technique with exercises. Improved exercise technique, movement at target joints, use of target muscles after min to mod verbal, visual, tactile cues   Patient tolerated progressions of strength and stability throughout session well with no episodes of nausea. He is fatigued by end of session however remains highly motivated. Occasional stabilization of RLE required to reduce knee hyperextension. Patient reports dizziness in car as well as with horizontal head turns, will confer with vestibular therapist per patient approval.  The patient continues to benefit from additional skilled PT services to improve LE strength and balance for decreased fall risk and improved quality of life.                       PT Education - 02/17/21 1605    Education Details exercise technique, body mechanics    Person(s) Educated Patient    Methods  Explanation;Demonstration;Tactile cues;Verbal cues    Comprehension Verbalized understanding;Returned demonstration;Verbal cues required;Tactile cues required            PT Short Term Goals - 02/06/21 1617      PT SHORT TERM GOAL #1   Title Patient will be independent in home exercise program to improve strength/mobility for better functional independence with ADLs and for self-management.    Baseline 3/3 HEP compliant    Time 6    Period Weeks    Status Partially Met    Target Date 02/20/21      PT SHORT TERM GOAL #2   Title Patient will be modified independent in walking on even/uneven surface with least restrictive assistive device, for 10+ minutes without rest break, reporting some difficulty or less to improve walking tolerance with community ambulation including grocery shopping, going to church,etc.    Baseline 02/03: instability, LOB multiple times while ambulating to elevator    Time 6    Period Weeks    Status On-going    Target Date 02/20/21      PT SHORT TERM GOAL #3   Title Patient will increase ABC scale score >80% to demonstrate better functional mobility and better confidence with ADLs.    Baseline 01/09/21: 60.625% 3/3/: 50%    Time 6    Period Weeks    Status Partially Met    Target Date 02/20/21             PT Long Term Goals - 02/06/21 1539      PT LONG TERM GOAL #1   Title Patient will increase FOTO score to equal to or greater than 65 to demonstrate statistically significant improvement in mobility and quality of life.    Baseline scored 41/100 on 05/21/2020; 7/22 40, 8/23: 60/100, 09/30/20=40, 01/09/21: 46 3/3: 40.3%    Time 12    Period Weeks    Status Partially Met    Target Date 04/03/21      PT LONG TERM GOAL #2   Title Patient will reduce falls risk as indicated by decreased TUG time to less than 11 seconds.    Baseline scored 37.9 sec with TUG on 05/21/2020; 21seconds with elevated rollator 7/22, 8/23: 13.62 sec with up and go walker,  01/09/21: 15.81s with hiking stick 3/3: 17.98 seconds one near LOB    Time 12  Period Weeks    Status Partially Met    Target Date 04/03/21      PT LONG TERM GOAL #3   Title Patient will increase right UE and LE gross strength to 4+/5 throughout to improve functional strength for independent gait, increased standing tolerance and increased ADL ability.    Baseline grossly +3/5 to -4/5 right UE and LE strength on 05/21/2020; grossly 4-/5 for RUE, grossly 4-/5 for RLE on 7/22, grossly 4-/5 RUE, grossly 4/5 RLE 3/3: see note    Time 8    Period Weeks    Status Partially Met    Target Date 04/03/21      PT LONG TERM GOAL #4   Title Patient will increase 10 meter walk test to >1.51ms as to improve gait speed for better community ambulation and to reduce fall risk.    Baseline on 7/22 .57 m/sself selected, fast  .73 m/s with LOB pt reported R knee buckling, elevated rollator used, 8/23: 1.06 m/s with up and go walker, 01/09/21: 0.72 m/s with hiking stick 3/3: 0.74 m/s with walking stick    Time 12    Period Weeks    Status Partially Met    Target Date 04/03/21      PT LONG TERM GOAL #5   Title Patient will increase Berg Balance score by > 6 points to demonstrate decreased fall risk during functional activities.    Baseline 8/23: 37/56, 09/30/20=40/56, 12/20: 42/56 3/3: deferred    Time 8    Period Weeks    Status Deferred      PT LONG TERM GOAL #6   Title Patient (< 628years old) will complete five times sit to stand test in < 10 seconds indicating an increased LE strength and improved balance.    Baseline 8/23: 22 sec, 09/30/20=13.04 sec, 12/20: 27.05 sec, 01/09/21: 18.01s 3/3: 23 seconds no UE support    Time 12    Period Weeks    Status Partially Met    Target Date 04/03/21      PT LONG TERM GOAL #7   Title Patient will improve 6 min walk test >1000 feet with LRAD for improved gait ability in community.    Baseline 8/23: not tested; 8/30 845, goal adjusted >20040f 09/30/20=600  feet - stopped test due to R ankle catching making gait unsafe, 11/25/20= 330 feet - stopped test due to severe dizziness, vomiting/nausea, sweating making gait unsafe; 01/09/21: 720 ft with no rest breaks 3/3: deferred due to nausea    Time 12    Period Weeks    Status Deferred                 Plan - 02/17/21 1606    Clinical Impression Statement Patient tolerated progressions of strength and stability throughout session well with no episodes of nausea. He is fatigued by end of session however remains highly motivated. Occasional stabilization of RLE required to reduce knee hyperextension. Patient reports dizziness in car as well as with horizontal head turns, will confer with vestibular therapist per patient approval.  The patient continues to benefit from additional skilled PT services to improve LE strength and balance for decreased fall risk and improved quality of life.    Personal Factors and Comorbidities Comorbidity 3+;Time since onset of injury/illness/exacerbation    Comorbidities anxiety, back pain, COPD, headache, HTN, MI, sleep apnea, migraines    Examination-Activity Limitations Locomotion Level;Transfers;Bed Mobility;Stand;Stairs;Sleep;Squat;Bend    Examination-Participation Restrictions Driving;Medication Management    Stability/Clinical Decision Making  Unstable/Unpredictable    Rehab Potential Fair    PT Frequency 2x / week    PT Duration 12 weeks    PT Treatment/Interventions ADLs/Self Care Home Management;Canalith Repostioning;Moist Heat;Electrical Stimulation;Gait training;Stair training;Functional mobility training;Therapeutic activities;Therapeutic exercise;Balance training;Neuromuscular re-education;Patient/family education;Manual techniques;Passive range of motion;Vestibular    PT Next Visit Plan update HEP ea session for continued strengthening/balanceing at home    PT [place] Access Code: [email or web address] updated this session    Consulted and Agree  with Plan of Care Patient           Patient will benefit from skilled therapeutic intervention in order to improve the following deficits and impairments:  Decreased activity tolerance,Decreased balance,Decreased cognition,Decreased mobility,Difficulty walking,Abnormal gait,Decreased range of motion,Dizziness,Pain,Impaired flexibility,Decreased strength,Impaired sensation,Decreased endurance,Decreased safety awareness  Visit Diagnosis: Muscle weakness (generalized)  Unsteadiness on feet  Dizziness and giddiness     Problem List Patient Active Problem List   Diagnosis Date Noted  . Degenerative spondylolisthesis 03/25/2020  . Physical deconditioning 03/04/2020  . Lobar pneumonia, unspecified organism ([place]) 03/04/2020  . Intractable hemiplegic migraine with status migrainosus 03/02/2020  . Current tobacco use 02/05/2020  . Abnormal findings on diagnostic imaging of lung 02/05/2020  . Shortness of breath 02/05/2020  . Herniated disc, cervical 01/23/2020  . Cervical spondylosis with myelopathy and radiculopathy 01/23/2020  . Pre-operative respiratory examination 01/12/2020  . Weakness on right side of face 12/22/2019  . Osteoarthritis of spine with radiculopathy, lumbar region 12/11/2019  . Intractable migraine with aura with status migrainosus 11/09/2019  . Former smoker 08/27/2018  . Benign essential HTN 08/27/2018  . Cough productive of purulent sputum 11/08/2017  . GERD (gastroesophageal reflux disease) 05/06/2017  . Contusion of hand 04/22/2017  . BP (high blood pressure) 04/22/2017  . Oxygen desaturation 04/22/2017  . Prostate lump 04/22/2017  . Unstable angina ([place]) 04/27/2016  . Chest pain 04/24/2016  . Morbid obesity ([place]) 01/29/2016  . Elevated ALT measurement 10/30/2015  . Nonspecific elevation of levels of transaminase and lactic acid dehydrogenase (ldh) 10/30/2015  . Reduced libido 10/29/2015  . Hyperlipidemia 08/06/2015  . Anxiety 08/06/2015  .  Atherosclerosis of coronary artery 08/06/2015  . Childhood asthma 08/06/2015  . Chronic obstructive pulmonary disease ([place]) 08/06/2015  . OSA (obstructive sleep apnea) 08/06/2015  . Anxiety disorder 08/06/2015  . Atherosclerosis of native coronary artery of native heart with stable angina pectoris ([place]) 08/06/2015  . Uncomplicated asthma 29/57/4734   Janna Arch, PT, DPT   02/17/2021, 5:03 PM  [place] MAIN [facility] SERVICES [address] [place], [place], [address] Phone: [phone]   Fax:  [phone]  Name: SHUNSUKE GRANZOW MRN: [id number] Date of Birth: 1971-01-21

## 2021-02-20 ENCOUNTER — Ambulatory Visit: Payer: BC Managed Care – PPO

## 2021-02-24 ENCOUNTER — Ambulatory Visit: Payer: BC Managed Care – PPO

## 2021-02-27 ENCOUNTER — Ambulatory Visit: Payer: BC Managed Care – PPO

## 2021-02-27 ENCOUNTER — Other Ambulatory Visit: Payer: Self-pay

## 2021-02-27 DIAGNOSIS — R42 Dizziness and giddiness: Secondary | ICD-10-CM

## 2021-02-27 DIAGNOSIS — R2681 Unsteadiness on feet: Secondary | ICD-10-CM

## 2021-02-27 DIAGNOSIS — M6281 Muscle weakness (generalized): Secondary | ICD-10-CM | POA: Diagnosis not present

## 2021-02-27 NOTE — Therapy (Signed)
[place] MAIN [facility] SERVICES [address] [place], [place], [address] Phone: [phone]   Fax:  [phone]  Physical Therapy Treatment  Patient Details  Name: Cory Espinoza MRN: [id number] Date of Birth: 03-Jun-1971 Referring Provider (PT): Dr. Joselyn Arrow   Encounter Date: 02/27/2021   PT End of Session - 02/27/21 1704    Visit Number 32    Number of Visits 49    Date for PT Re-Evaluation 03/06/21    Authorization Type [employer] PPO    Authorization Time Period 01/09/21-04/03/21    PT Start Time 1515    PT Stop Time 1545    PT Time Calculation (min) 30 min    Equipment Utilized During Treatment Gait belt    Activity Tolerance Patient tolerated treatment well;No increased pain    Behavior During Therapy WFL for tasks assessed/performed           Past Medical History:  Diagnosis Date  . Allergy   . Anginal pain ([place])   . Anxiety   . Arthritis    lumbar spine  . Asthma   . Atrial fibrillation ([place])   . Back pain    Severe Lumbar Pain  . CHF (congestive heart failure) ([place])   . COPD (chronic obstructive pulmonary disease) ([facility])    Restrictive lung disease  . Coronary artery disease   . Dyspnea   . Dysrhythmia    afib  . GERD (gastroesophageal reflux disease)   . Headache    [name], onset October 2020, cluster headaches in the past  . History of kidney stones   . Hyperlipidemia   . Hypertension   . Myocardial infarction ([place])    at age 4  . Pneumonia   . Restrictive lung disease   . Sleep apnea    unable to use cpap since onset of migraines    Past Surgical History:  Procedure Laterality Date  . ABDOMINAL EXPOSURE N/A 03/25/2020   Procedure: ABDOMINAL EXPOSURE;  Surgeon: Rosetta Posner, MD;  Location: [facility] OR;  Service: Vascular;  Laterality: N/A;  . ANTERIOR CERVICAL DECOMP/DISCECTOMY FUSION N/A 01/23/2020   Procedure: ANTERIOR CERVICAL DECOMPRESSION FUSION - CERVICAL SIX-CERVICAL SEVEN;  Surgeon: Earnie Larsson, MD;   Location: [place];  Service: Neurosurgery;  Laterality: N/A;  . ANTERIOR LUMBAR FUSION N/A 03/25/2020   Procedure: ANTERIOR LUMBAR INTERBODY FUSION LUMBAR FIVE-SACRAL ONE.;  Surgeon: Earnie Larsson, MD;  Location: [place];  Service: Neurosurgery;  Laterality: N/A;  anterior  . BACK SURGERY  1996  . CARDIAC CATHETERIZATION Left 04/27/2016   Procedure: Left Heart Cath and Coronary Angiography;  Surgeon: Corey Skains, MD;  Location: [place] CV LAB;  Service: Cardiovascular;  Laterality: Left;  . CARDIAC CATHETERIZATION N/A 04/27/2016   Procedure: Intravascular Pressure Wire/FFR Study;  Surgeon: Yolonda Kida, MD;  Location: [place] CV LAB;  Service: Cardiovascular;  Laterality: N/A;  . CATHETERIZATION OF PULMONARY ARTERY WITH RETRIEVAL OF FOREIGN BODY Bilateral 04/07/2011   heart  . DEGENERATIVE SPONDYLOLISTHESIS    . KNEE ARTHROSCOPY Bilateral 2004  . [place] SURGERY  1999  . RADICULOPATHY, CERVICAL REGION    . TONSILLECTOMY      There were no vitals filed for this visit.   Subjective Assessment - 02/27/21 1527    Subjective Pt came in today despite contnued difficulty with migraine HA triggered by weather font. he appologizes for missing last two sessions for same reason. Pt reports continued vertigo issues recently which have occured with migraine,  but made much worse with being in the car, episodes of spinning sensation happen continuously for long stretches. His friend also delivers a message from wife reporting a fall at home last night, hitting head (pt has no memory of this), and he has had increased confusion.    Pertinent History Pt has a complex medical history. 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 He states that he fell 10-12 feet and struck his head, neck, and back. He was able to finish the course which took him approximately 45 minutes more to complete. He does endorse a second fall while finishing the course but did not suffer  a second head injury. He states that he started getting headaches that day which have persisted since that time. 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 He does report improvement in his headaches recently. Neurology was concerned about possible BPPV and have referred him for a vestibular evaluation. In addition since the injury, pt developed RUE and RLE pain and weakness. Pt states that NCV showed abnormal nerve conduction in RUE. He has since undergone a C7-7 ACDF on 01/23/20. He reports initial improvement in RUE strength, but states that he has a gradual return of his RUE weakness. He was also having significant RLE weakness and pain and underwent and L5-S1 anterior lumbar interbody fusion on 03/25/20. Patient reports that he does not have any back precautions but needs to wear the low back brace for one more month. Patient reports he has a follow-up appointment with the surgeon next month. Patient reports he has a follow-up appointment with Dr. Brigitte Pulse, neurologist, in August. He arrives to therapy ambulating with an upright rollator walker. Pt denies any mention of concussion after his injury. He has been having cognitive issues since his injury and has previously had a heavy metals screen as well as neurocognitive testing. He has had an MRI of his brain with and without contrast on 12/18/19. Results were mildly motion degraded examination. No evidence of acute intracranial abnormality. Minimal chronic small vessel ischemic disease. He has tremor in both his hands. He has a positive family history of Parkinson's disease in his grandfather. He is complaining of constant dizziness and unsteadiness which are worse with activity. Patient states that he frequently loses his balance and his wife has to steady him.    Currently in Pain? No/denies    Pain Score 8     Pain Location --   migraine HA         INTERVENTION THIS DATE: -Nustep x10  minutes, level 1 c arms and legs  -339f AMB overground c Left walking stick, 5 x180 degree turns, 3-4 LOB with excessive lateral trunk sway that requires modA for recovery *pt has significantly impaired awareness of trunk positioning, no strong right responses observed, 1 attempting stepping strategy response -STS from chair + airex pad 1x10, minA at pelvis for lift and for balance assist prn  *resultant dry heaving for 3-4 minutes   Occulomotor screening this session demonstrates the following -loss of smooth pursuits with left occular rotation, saccadic -smooth pursuits with Right occular rotation are interupted by Lt lateral beating nystagmus -pt has no resting nystagmus that is noted      PT Short Term Goals - 02/06/21 1617      PT SHORT TERM GOAL #1   Title Patient will be independent in home exercise program  to improve strength/mobility for better functional independence with ADLs and for self-management.    Baseline 3/3 HEP compliant    Time 6    Period Weeks    Status Partially Met    Target Date 02/20/21      PT SHORT TERM GOAL #2   Title Patient will be modified independent in walking on even/uneven surface with least restrictive assistive device, for 10+ minutes without rest break, reporting some difficulty or less to improve walking tolerance with community ambulation including grocery shopping, going to church,etc.    Baseline 02/03: instability, LOB multiple times while ambulating to elevator    Time 6    Period Weeks    Status On-going    Target Date 02/20/21      PT SHORT TERM GOAL #3   Title Patient will increase ABC scale score >80% to demonstrate better functional mobility and better confidence with ADLs.    Baseline 01/09/21: 60.625% 3/3/: 50%    Time 6    Period Weeks    Status Partially Met    Target Date 02/20/21             PT Long Term Goals - 02/06/21 1539      PT LONG TERM GOAL #1   Title Patient will increase FOTO score to equal to or  greater than 65 to demonstrate statistically significant improvement in mobility and quality of life.    Baseline scored 41/100 on 05/21/2020; 7/22 40, 8/23: 60/100, 09/30/20=40, 01/09/21: 46 3/3: 40.3%    Time 12    Period Weeks    Status Partially Met    Target Date 04/03/21      PT LONG TERM GOAL #2   Title Patient will reduce falls risk as indicated by decreased TUG time to less than 11 seconds.    Baseline scored 37.9 sec with TUG on 05/21/2020; 21seconds with elevated rollator 7/22, 8/23: 13.62 sec with up and go walker, 01/09/21: 15.81s with hiking stick 3/3: 17.98 seconds one near LOB    Time 12    Period Weeks    Status Partially Met    Target Date 04/03/21      PT LONG TERM GOAL #3   Title Patient will increase right UE and LE gross strength to 4+/5 throughout to improve functional strength for independent gait, increased standing tolerance and increased ADL ability.    Baseline grossly +3/5 to -4/5 right UE and LE strength on 05/21/2020; grossly 4-/5 for RUE, grossly 4-/5 for RLE on 7/22, grossly 4-/5 RUE, grossly 4/5 RLE 3/3: see note    Time 8    Period Weeks    Status Partially Met    Target Date 04/03/21      PT LONG TERM GOAL #4   Title Patient will increase 10 meter walk test to >1.97m/s as to improve gait speed for better community ambulation and to reduce fall risk.    Baseline on 7/22 .57 m/sself selected, fast  .73 m/s with LOB pt reported R knee buckling, elevated rollator used, 8/23: 1.06 m/s with up and go walker, 01/09/21: 0.72 m/s with hiking stick 3/3: 0.74 m/s with walking stick    Time 12    Period Weeks    Status Partially Met    Target Date 04/03/21      PT LONG TERM GOAL #5   Title Patient will increase Berg Balance score by > 6 points to demonstrate decreased fall risk during functional activities.    Baseline 8/23:  37/56, 09/30/20=40/56, 12/20: 42/56 3/3: deferred    Time 8    Period Weeks    Status Deferred      PT LONG TERM GOAL #6   Title  Patient (< 39 years old) will complete five times sit to stand test in < 10 seconds indicating an increased LE strength and improved balance.    Baseline 8/23: 22 sec, 09/30/20=13.04 sec, 12/20: 27.05 sec, 01/09/21: 18.01s 3/3: 23 seconds no UE support    Time 12    Period Weeks    Status Partially Met    Target Date 04/03/21      PT LONG TERM GOAL #7   Title Patient will improve 6 min walk test >1000 feet with LRAD for improved gait ability in community.    Baseline 8/23: not tested; 8/30 845, goal adjusted >2030f, 09/30/20=600 feet - stopped test due to R ankle catching making gait unsafe, 11/25/20= 330 feet - stopped test due to severe dizziness, vomiting/nausea, sweating making gait unsafe; 01/09/21: 720 ft with no rest breaks 3/3: deferred due to nausea    Time 12    Period Weeks    Status Deferred                 Plan - 02/27/21 1708    Clinical Impression Statement Pt arrives to OPPT for session out of obligation as he has missed previous 2 appointments, but continues to have severe migraine flare. Pt is highly motivated to continue to work toward his therapy goals despite his wife encouraging him to remain home and rest. Pt has clear impairment of mentation, memory, and substantial imbalance compared to his typical presentation. Pt partakes in limited session, requires more physical assist for balance during AMB. He has sudden aggravation of nausea and dry heaving toward end of session, unclear if made worse by occulomotor exam or exercise intensity. Pt encouraged to consider a higher level of assitive device when he is accutely exacerbated. Education is shared with his friends as well. Session ended after ~30 minutes as not to reexacerbate dry heaving again. Pt will conitnue to benefit from skilled PT intervention to address focal deficits and impairments identified in evaluation and subsequent vistis, in order to maximize potential to advance progress toward goals of treatment,  safety and independence with basic ADL performance.    Personal Factors and Comorbidities Comorbidity 3+;Time since onset of injury/illness/exacerbation    Comorbidities anxiety, back pain, COPD, headache, HTN, MI, sleep apnea, migraines    Examination-Activity Limitations Locomotion Level;Transfers;Bed Mobility;Stand;Stairs;Sleep;Squat;Bend    Examination-Participation Restrictions Driving;Medication Management    Stability/Clinical Decision Making Unstable/Unpredictable    Clinical Decision Making High    Rehab Potential Fair    PT Frequency 2x / week    PT Duration 12 weeks    PT Treatment/Interventions ADLs/Self Care Home Management;Canalith Repostioning;Moist Heat;Electrical Stimulation;Gait training;Stair training;Functional mobility training;Therapeutic activities;Therapeutic exercise;Balance training;Neuromuscular re-education;Patient/family education;Manual techniques;Passive range of motion;Vestibular    PT Next Visit Plan update HEP ea session for continued strengthening/balanceing at home    [place]: [email or web address] updated this session    Consulted and Agree with Plan of Care Patient;Other (Comment)   Friend          Patient will benefit from skilled therapeutic intervention in order to improve the following deficits and impairments:  Decreased activity tolerance,Decreased balance,Decreased cognition,Decreased mobility,Difficulty walking,Abnormal gait,Decreased range of motion,Dizziness,Pain,Impaired flexibility,Decreased strength,Impaired sensation,Decreased endurance,Decreased safety awareness  Visit Diagnosis: Muscle weakness (generalized)  Unsteadiness on feet  Dizziness and  giddiness     Problem List Patient Active Problem List   Diagnosis Date Noted  . Degenerative spondylolisthesis 03/25/2020  . Physical deconditioning 03/04/2020  . Lobar pneumonia, unspecified organism ([place]) 03/04/2020  . Intractable hemiplegic migraine with  status migrainosus 03/02/2020  . Current tobacco use 02/05/2020  . Abnormal findings on diagnostic imaging of lung 02/05/2020  . Shortness of breath 02/05/2020  . Herniated disc, cervical 01/23/2020  . Cervical spondylosis with myelopathy and radiculopathy 01/23/2020  . Pre-operative respiratory examination 01/12/2020  . Weakness on right side of face 12/22/2019  . Osteoarthritis of spine with radiculopathy, lumbar region 12/11/2019  . Intractable migraine with aura with status migrainosus 11/09/2019  . Former smoker 08/27/2018  . Benign essential HTN 08/27/2018  . Cough productive of purulent sputum 11/08/2017  . GERD (gastroesophageal reflux disease) 05/06/2017  . Contusion of hand 04/22/2017  . BP (high blood pressure) 04/22/2017  . Oxygen desaturation 04/22/2017  . Prostate lump 04/22/2017  . Unstable angina ([place]) 04/27/2016  . Chest pain 04/24/2016  . Morbid obesity ([place]) 01/29/2016  . Elevated ALT measurement 10/30/2015  . Nonspecific elevation of levels of transaminase and lactic acid dehydrogenase (ldh) 10/30/2015  . Reduced libido 10/29/2015  . Hyperlipidemia 08/06/2015  . Anxiety 08/06/2015  . Atherosclerosis of coronary artery 08/06/2015  . Childhood asthma 08/06/2015  . Chronic obstructive pulmonary disease ([place]) 08/06/2015  . OSA (obstructive sleep apnea) 08/06/2015  . Anxiety disorder 08/06/2015  . Atherosclerosis of native coronary artery of native heart with stable angina pectoris ([place]) 08/06/2015  . Uncomplicated asthma 18/34/3735   5:21 PM, 02/27/21 Etta Grandchild, PT, DPT Physical Therapist - [place] [phone]     Etta Grandchild 02/27/2021, 5:15 PM  [place] MAIN [facility] SERVICES [address] [place], [place], [address] Phone: [phone]   Fax:  [phone]  Name: Cory Espinoza MRN: [id number] Date of Birth: 25-Jul-1971

## 2021-03-03 ENCOUNTER — Ambulatory Visit: Payer: BC Managed Care – PPO

## 2021-03-06 ENCOUNTER — Ambulatory Visit: Payer: BC Managed Care – PPO

## 2021-03-06 ENCOUNTER — Other Ambulatory Visit: Payer: Self-pay

## 2021-03-06 DIAGNOSIS — R2681 Unsteadiness on feet: Secondary | ICD-10-CM

## 2021-03-06 DIAGNOSIS — R42 Dizziness and giddiness: Secondary | ICD-10-CM

## 2021-03-06 DIAGNOSIS — M6281 Muscle weakness (generalized): Secondary | ICD-10-CM

## 2021-03-06 NOTE — Therapy (Signed)
[place] MAIN [facility] SERVICES [address] [place], [place], [address] Phone: [phone]   Fax:  [phone]  Physical Therapy Treatment/RECERT  Patient Details  Name: Cory Espinoza MRN: [id number] Date of Birth: [birth year] Referring Provider (PT): Dr. Joselyn Arrow   Encounter Date: 03/06/2021   PT End of Session - 03/06/21 1823    Visit Number 33    Number of Visits 84    Date for PT Re-Evaluation 05/29/21    Authorization Type [employer] PPO    Authorization Time Period 01/09/21-04/03/21    PT Start Time 1515    PT Stop Time 1559    PT Time Calculation (min) 44 min    Equipment Utilized During Treatment Gait belt    Activity Tolerance Patient tolerated treatment well;No increased pain    Behavior During Therapy WFL for tasks assessed/performed           Past Medical History:  Diagnosis Date  . Allergy   . Anginal pain ([place])   . Anxiety   . Arthritis    lumbar spine  . Asthma   . Atrial fibrillation ([place])   . Back pain    Severe Lumbar Pain  . CHF (congestive heart failure) ([place])   . COPD (chronic obstructive pulmonary disease) ([facility])    Restrictive lung disease  . Coronary artery disease   . Dyspnea   . Dysrhythmia    afib  . GERD (gastroesophageal reflux disease)   . Headache    [name], onset October 2020, cluster headaches in the past  . History of kidney stones   . Hyperlipidemia   . Hypertension   . Myocardial infarction ([place])    at age 86  . Pneumonia   . Restrictive lung disease   . Sleep apnea    unable to use cpap since onset of migraines    Past Surgical History:  Procedure Laterality Date  . ABDOMINAL EXPOSURE N/A 03/25/2020   Procedure: ABDOMINAL EXPOSURE;  Surgeon: Rosetta Posner, MD;  Location: [facility] OR;  Service: Vascular;  Laterality: N/A;  . ANTERIOR CERVICAL DECOMP/DISCECTOMY FUSION N/A 01/23/2020   Procedure: ANTERIOR CERVICAL DECOMPRESSION FUSION - CERVICAL SIX-CERVICAL SEVEN;  Surgeon: Earnie Larsson,  MD;  Location: [place];  Service: Neurosurgery;  Laterality: N/A;  . ANTERIOR LUMBAR FUSION N/A 03/25/2020   Procedure: ANTERIOR LUMBAR INTERBODY FUSION LUMBAR FIVE-SACRAL ONE.;  Surgeon: Earnie Larsson, MD;  Location: [place];  Service: Neurosurgery;  Laterality: N/A;  anterior  . BACK SURGERY  1996  . CARDIAC CATHETERIZATION Left 04/27/2016   Procedure: Left Heart Cath and Coronary Angiography;  Surgeon: Corey Skains, MD;  Location: [place] [facility] LAB;  Service: Cardiovascular;  Laterality: Left;  . CARDIAC CATHETERIZATION N/A 04/27/2016   Procedure: Intravascular Pressure Wire/FFR Study;  Surgeon: Yolonda Kida, MD;  Location: [place] [facility] LAB;  Service: Cardiovascular;  Laterality: N/A;  . CATHETERIZATION OF PULMONARY ARTERY WITH RETRIEVAL OF FOREIGN BODY Bilateral 04/07/2011   heart  . DEGENERATIVE SPONDYLOLISTHESIS    . KNEE ARTHROSCOPY Bilateral 2004  . [place] SURGERY  1999  . RADICULOPATHY, CERVICAL REGION    . TONSILLECTOMY      There were no vitals filed for this visit.   Subjective Assessment - 03/06/21 1821    Subjective Patient reports he continues to have debilitating migraine affecting his balance, legs, and mobility. Wanted to come despite pain due to high motivation for progression.    Pertinent History Pt has a complex medical  history. 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 He states that he fell 10-12 feet and struck his head, neck, and back. He was able to finish the course which took him approximately 45 minutes more to complete. He does endorse a second fall while finishing the course but did not suffer a second head injury. He states that he started getting headaches that day which have persisted since that time. 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 He does report improvement in his headaches recently. Neurology was concerned about possible BPPV  and have referred him for a vestibular evaluation. In addition since the injury, pt developed RUE and RLE pain and weakness. Pt states that NCV showed abnormal nerve conduction in RUE. He has since undergone a C7-7 ACDF on 01/23/20. He reports initial improvement in RUE strength, but states that he has a gradual return of his RUE weakness. He was also having significant RLE weakness and pain and underwent and L5-S1 anterior lumbar interbody fusion on 03/25/20. Patient reports that he does not have any back precautions but needs to wear the low back brace for one more month. Patient reports he has a follow-up appointment with the surgeon next month. Patient reports he has a follow-up appointment with Dr. Brigitte Pulse, neurologist, in August. He arrives to therapy ambulating with an upright rollator walker. Pt denies any mention of concussion after his injury. He has been having cognitive issues since his injury and has previously had a heavy metals screen as well as neurocognitive testing. He has had an MRI of his brain with and without contrast on 12/18/19. Results were mildly motion degraded examination. No evidence of acute intracranial abnormality. Minimal chronic small vessel ischemic disease. He has tremor in both his hands. He has a positive family history of Parkinson's disease in his grandfather. He is complaining of constant dizziness and unsteadiness which are worse with activity. Patient states that he frequently loses his balance and his wife has to steady him.    Limitations Lifting;Standing;Walking;House hold activities    Diagnostic tests He has had an MRI of his brain with and without contrast on 12/18/19. Results were mildly motion degraded examination. No evidence of acute intracranial abnormality. Minimal chronic small vessel ischemic disease.    Patient Stated Goals to be able to ambulate with a cane or no AD, to be able to drive, to be able to ride his motorcycle    Currently in Pain? Yes    Pain Score  8     Pain Location Head    Pain Descriptors / Indicators Aching;Stabbing;Headache    Pain Type Acute pain    Pain Onset 1 to 4 weeks ago    Pain Frequency Constant              Migraine is about an 8/10, back is a 6/10     Goals:  6 min walk test: deferred due to patient throwing up.  BERG-deferred due to patient throwing up.  10 MWT: 12. 4 seconds= 0.81 m/s with walking stick : improved TUG: 15.57 with walking stick : improved  FOTO: 49% Improved ABC: 63% : Improved   Treatment: 2.5 ankle weight: -LAQ 10x each LE  -march 10x each LE to PT hand for arc of motion  -alternating LAQ 10x each LE  RTB hamstring curl 10x each LE  Patient requires use of emesis bag  during session as well as darkened room due to aggravation of symptoms in bright lights.     Patient demonstrated progress with functional goals. Not all goals performed this session due to patient's migraine resulting in nausea and ultimatley throwing up throughout session. Will perform rest of goals next session. Goals that were performed however did show progress as TUG and 10 MWT. He remains highly motivated to increase his independence. Patient's condition has the potential to improve in response to therapy. Maximum improvement is yet to be obtained. The anticipated improvement is attainable and reasonable in a generally predictable time. The patient continues to benefit from additional skilled PT services to improve LE strength and balance for decreased fall risk and improved quality of life            PT Education - 03/06/21 1823    Education Details goals, POC    Person(s) Educated Patient    Methods Explanation;Demonstration;Tactile cues;Verbal cues    Comprehension Verbalized understanding;Returned demonstration;Verbal cues required;Tactile cues required            PT Short Term Goals - 03/06/21 1601      PT SHORT TERM GOAL #1   Title Patient will be independent in home exercise program to  improve strength/mobility for better functional independence with ADLs and for self-management.    Baseline 3/3 HEP compliant 3/31 hep compliant    Time 6    Period Weeks    Status Achieved    Target Date 02/20/21      PT SHORT TERM GOAL #2   Title Patient will be modified independent in walking on even/uneven surface with least restrictive assistive device, for 10+ minutes without rest break, reporting some difficulty or less to improve walking tolerance with community ambulation including grocery shopping, going to church,etc.    Baseline 02/03: instability, LOB multiple times while ambulating to elevator 3/31: unable to test due to throwing up    Time 6    Period Weeks    Status On-going    Target Date 04/17/21      PT SHORT TERM GOAL #3   Title Patient will increase ABC scale score >80% to demonstrate better functional mobility and better confidence with ADLs.    Baseline 01/09/21: 60.625% 3/3/: 50% 3/31: 63%    Time 6    Period Weeks    Status Partially Met    Target Date 04/17/21             PT Long Term Goals - 03/06/21 1826      PT LONG TERM GOAL #1   Title Patient will increase FOTO score to equal to or greater than 65 to demonstrate statistically significant improvement in mobility and quality of life.    Baseline scored 41/100 on 05/21/2020; 7/22 40, 8/23: 60/100, 09/30/20=40, 01/09/21: 46 3/3: 40.3% 3/31: 49%    Time 12    Period Weeks    Status Partially Met    Target Date 05/29/21      PT LONG TERM GOAL #2   Title Patient will reduce falls risk as indicated by decreased TUG time to less than 11 seconds.    Baseline scored 37.9 sec with TUG on 05/21/2020; 21seconds with elevated rollator 7/22, 8/23: 13.62 sec with up and go walker, 01/09/21: 15.81s with hiking stick 3/3: 17.98 seconds one near LOB 3/31: 15.57 seconds with walking stick    Time 12    Period Weeks    Status Partially Met    Target Date  05/29/21      PT LONG TERM GOAL #3   Title Patient will  increase right UE and LE gross strength to 4+/5 throughout to improve functional strength for independent gait, increased standing tolerance and increased ADL ability.    Baseline grossly +3/5 to -4/5 right UE and LE strength on 05/21/2020; grossly 4-/5 for RUE, grossly 4-/5 for RLE on 7/22, grossly 4-/5 RUE, grossly 4/5 RLE 3/3: see note    Time 8    Period Weeks    Status Partially Met    Target Date 05/29/21      PT LONG TERM GOAL #4   Title Patient will increase 10 meter walk test to >1.81ms as to improve gait speed for better community ambulation and to reduce fall risk.    Baseline on 7/22 .57 m/sself selected, fast  .73 m/s with LOB pt reported R knee buckling, elevated rollator used, 8/23: 1.06 m/s with up and go walker, 01/09/21: 0.72 m/s with hiking stick 3/3: 0.74 m/s with walking stick 3/31: 0.81 m/s w walking stick    Time 12    Period Weeks    Status Partially Met    Target Date 05/29/21      PT LONG TERM GOAL #5   Title Patient will increase Berg Balance score by > 6 points to demonstrate decreased fall risk during functional activities.    Baseline 8/23: 37/56, 09/30/20=40/56, 12/20: 42/56 3/3: deferred 3/31 will perform next time; deferred due to throwing up    Time 8    Period Weeks    Status Deferred    Target Date 05/29/21      PT LONG TERM GOAL #6   Title Patient (< 662years old) will complete five times sit to stand test in < 10 seconds indicating an increased LE strength and improved balance.    Baseline 8/23: 22 sec, 09/30/20=13.04 sec, 12/20: 27.05 sec, 01/09/21: 18.01s 3/3: 23 seconds no UE support 3/31; deferred due to patient throwing up    Time 12    Period Weeks    Status Partially Met    Target Date 05/29/21      PT LONG TERM GOAL #7   Title Patient will improve 6 min walk test >1000 feet with LRAD for improved gait ability in community.    Baseline 8/23: not tested; 8/30 845, goal adjusted >20079f 09/30/20=600 feet - stopped test due to R ankle  catching making gait unsafe, 11/25/20= 330 feet - stopped test due to severe dizziness, vomiting/nausea, sweating making gait unsafe; 01/09/21: 720 ft with no rest breaks 3/3: deferred due to nausea 3/31: deferred due to patient throwing up    Time 12    Period Weeks    Status Deferred    Target Date 05/29/21                 Plan - 03/06/21 1826    Clinical Impression Statement Patient demonstrated progress with functional goals. Not all goals performed this session due to patient's migraine resulting in nausea and ultimatley throwing up throughout session. Will perform rest of goals next session. Goals that were performed however did show progress as TUG and 10 MWT. He remains highly motivated to increase his independence. Patient's condition has the potential to improve in response to therapy. Maximum improvement is yet to be obtained. The anticipated improvement is attainable and reasonable in a generally predictable time. The patient continues to benefit from additional skilled PT services to improve LE strength and balance  for decreased fall risk and improved quality of life    Personal Factors and Comorbidities Comorbidity 3+;Time since onset of injury/illness/exacerbation    Comorbidities anxiety, back pain, COPD, headache, HTN, MI, sleep apnea, migraines    Examination-Activity Limitations Locomotion Level;Transfers;Bed Mobility;Stand;Stairs;Sleep;Squat;Bend    Examination-Participation Restrictions Driving;Medication Management    Stability/Clinical Decision Making Unstable/Unpredictable    Rehab Potential Fair    PT Frequency 2x / week    PT Duration 12 weeks    PT Treatment/Interventions ADLs/Self Care Home Management;Canalith Repostioning;Moist Heat;Electrical Stimulation;Gait training;Stair training;Functional mobility training;Therapeutic activities;Therapeutic exercise;Balance training;Neuromuscular re-education;Patient/family education;Manual techniques;Passive range of  motion;Vestibular    PT Next Visit Plan update HEP ea session for continued strengthening/balanceing at home    PT [place] Access Code: [email or web address] updated this session    Consulted and Agree with Plan of Care Patient;Other (Comment)   Friend          Patient will benefit from skilled therapeutic intervention in order to improve the following deficits and impairments:  Decreased activity tolerance,Decreased balance,Decreased cognition,Decreased mobility,Difficulty walking,Abnormal gait,Decreased range of motion,Dizziness,Pain,Impaired flexibility,Decreased strength,Impaired sensation,Decreased endurance,Decreased safety awareness  Visit Diagnosis: Muscle weakness (generalized)  Unsteadiness on feet  Dizziness and giddiness     Problem List Patient Active Problem List   Diagnosis Date Noted  . Degenerative spondylolisthesis 03/25/2020  . Physical deconditioning 03/04/2020  . Lobar pneumonia, unspecified organism ([place]) 03/04/2020  . Intractable hemiplegic migraine with status migrainosus 03/02/2020  . Current tobacco use 02/05/2020  . Abnormal findings on diagnostic imaging of lung 02/05/2020  . Shortness of breath 02/05/2020  . Herniated disc, cervical 01/23/2020  . Cervical spondylosis with myelopathy and radiculopathy 01/23/2020  . Pre-operative respiratory examination 01/12/2020  . Weakness on right side of face 12/22/2019  . Osteoarthritis of spine with radiculopathy, lumbar region 12/11/2019  . Intractable migraine with aura with status migrainosus 11/09/2019  . Former smoker 08/27/2018  . Benign essential HTN 08/27/2018  . Cough productive of purulent sputum 11/08/2017  . GERD (gastroesophageal reflux disease) 05/06/2017  . Contusion of hand 04/22/2017  . BP (high blood pressure) 04/22/2017  . Oxygen desaturation 04/22/2017  . Prostate lump 04/22/2017  . Unstable angina ([place]) 04/27/2016  . Chest pain 04/24/2016  . Morbid obesity ([place]) 01/29/2016   . Elevated ALT measurement 10/30/2015  . Nonspecific elevation of levels of transaminase and lactic acid dehydrogenase (ldh) 10/30/2015  . Reduced libido 10/29/2015  . Hyperlipidemia 08/06/2015  . Anxiety 08/06/2015  . Atherosclerosis of coronary artery 08/06/2015  . Childhood asthma 08/06/2015  . Chronic obstructive pulmonary disease ([place]) 08/06/2015  . OSA (obstructive sleep apnea) 08/06/2015  . Anxiety disorder 08/06/2015  . Atherosclerosis of native coronary artery of native heart with stable angina pectoris ([place]) 08/06/2015  . Uncomplicated asthma 81/15/7262   Janna Arch, PT, DPT   03/06/2021, 6:29 PM  [place] MAIN [facility] SERVICES [address] [place], [place], [address] Phone: [phone]   Fax:  [phone]  Name: Cory Espinoza MRN: [id number] Date of Birth: Nov 29, 1971

## 2021-03-10 ENCOUNTER — Other Ambulatory Visit: Payer: Self-pay

## 2021-03-10 ENCOUNTER — Ambulatory Visit: Payer: BC Managed Care – PPO | Attending: Neurology

## 2021-03-10 DIAGNOSIS — R42 Dizziness and giddiness: Secondary | ICD-10-CM | POA: Diagnosis present

## 2021-03-10 DIAGNOSIS — M6281 Muscle weakness (generalized): Secondary | ICD-10-CM | POA: Diagnosis not present

## 2021-03-10 DIAGNOSIS — R2681 Unsteadiness on feet: Secondary | ICD-10-CM

## 2021-03-10 NOTE — Therapy (Signed)
[place] MAIN [facility] SERVICES [address] [place], [place], [address] Phone: [phone]   Fax:  [phone]  Physical Therapy Treatment  Patient Details  Name: Cory Espinoza MRN: [id number] Date of Birth: [birth year] Referring Provider (PT): Dr. Joselyn Arrow   Encounter Date: 03/10/2021   PT End of Session - 03/10/21 1551    Visit Number 34    Number of Visits 81    Date for PT Re-Evaluation 05/29/21    Authorization Type [employer] PPO    Authorization Time Period 01/09/21-04/03/21    PT Start Time 1515    PT Stop Time 1545    PT Time Calculation (min) 30 min    Equipment Utilized During Treatment Gait belt    Activity Tolerance Patient tolerated treatment well;No increased pain    Behavior During Therapy WFL for tasks assessed/performed           Past Medical History:  Diagnosis Date  . Allergy   . Anginal pain ([place])   . Anxiety   . Arthritis    lumbar spine  . Asthma   . Atrial fibrillation ([place])   . Back pain    Severe Lumbar Pain  . CHF (congestive heart failure) ([place])   . COPD (chronic obstructive pulmonary disease) ([facility])    Restrictive lung disease  . Coronary artery disease   . Dyspnea   . Dysrhythmia    afib  . GERD (gastroesophageal reflux disease)   . Headache    [name], onset October 2020, cluster headaches in the past  . History of kidney stones   . Hyperlipidemia   . Hypertension   . Myocardial infarction ([place])    at age 38  . Pneumonia   . Restrictive lung disease   . Sleep apnea    unable to use cpap since onset of migraines    Past Surgical History:  Procedure Laterality Date  . ABDOMINAL EXPOSURE N/A 03/25/2020   Procedure: ABDOMINAL EXPOSURE;  Surgeon: Rosetta Posner, MD;  Location: [facility] OR;  Service: Vascular;  Laterality: N/A;  . ANTERIOR CERVICAL DECOMP/DISCECTOMY FUSION N/A 01/23/2020   Procedure: ANTERIOR CERVICAL DECOMPRESSION FUSION - CERVICAL SIX-CERVICAL SEVEN;  Surgeon: Earnie Larsson, MD;   Location: [facility];  Service: Neurosurgery;  Laterality: N/A;  . ANTERIOR LUMBAR FUSION N/A 03/25/2020   Procedure: ANTERIOR LUMBAR INTERBODY FUSION LUMBAR FIVE-SACRAL ONE.;  Surgeon: Earnie Larsson, MD;  Location: [facility];  Service: Neurosurgery;  Laterality: N/A;  anterior  . BACK SURGERY  1996  . CARDIAC CATHETERIZATION Left 04/27/2016   Procedure: Left Heart Cath and Coronary Angiography;  Surgeon: Corey Skains, MD;  Location: [place] CV LAB;  Service: Cardiovascular;  Laterality: Left;  . CARDIAC CATHETERIZATION N/A 04/27/2016   Procedure: Intravascular Pressure Wire/FFR Study;  Surgeon: Yolonda Kida, MD;  Location: [place] [facility] LAB;  Service: Cardiovascular;  Laterality: N/A;  . CATHETERIZATION OF PULMONARY ARTERY WITH RETRIEVAL OF FOREIGN BODY Bilateral 04/07/2011   heart  . DEGENERATIVE SPONDYLOLISTHESIS    . KNEE ARTHROSCOPY Bilateral 2004  . [place] SURGERY  1999  . RADICULOPATHY, CERVICAL REGION    . TONSILLECTOMY      There were no vitals filed for this visit.   Subjective Assessment - 03/10/21 1549    Subjective Patient reports he fell over the weekend. His granddaughter fell down and he attempted to run towards her to check on her and fell since he was not wearing his AFO. His R knee is  painful now. Will have to leave session early due to doctor appointment    Pertinent History Pt has a complex medical history. 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 He states that he fell 10-12 feet and struck his head, neck, and back. He was able to finish the course which took him approximately 45 minutes more to complete. He does endorse a second fall while finishing the course but did not suffer a second head injury. He states that he started getting headaches that day which have persisted since that time. 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 He does report  improvement in his headaches recently. Neurology was concerned about possible BPPV and have referred him for a vestibular evaluation. In addition since the injury, pt developed RUE and RLE pain and weakness. Pt states that NCV showed abnormal nerve conduction in RUE. He has since undergone a C7-7 ACDF on 01/23/20. He reports initial improvement in RUE strength, but states that he has a gradual return of his RUE weakness. He was also having significant RLE weakness and pain and underwent and L5-S1 anterior lumbar interbody fusion on 03/25/20. Patient reports that he does not have any back precautions but needs to wear the low back brace for one more month. Patient reports he has a follow-up appointment with the surgeon next month. Patient reports he has a follow-up appointment with Dr. Brigitte Pulse, neurologist, in August. He arrives to therapy ambulating with an upright rollator walker. Pt denies any mention of concussion after his injury. He has been having cognitive issues since his injury and has previously had a heavy metals screen as well as neurocognitive testing. He has had an MRI of his brain with and without contrast on 12/18/19. Results were mildly motion degraded examination. No evidence of acute intracranial abnormality. Minimal chronic small vessel ischemic disease. He has tremor in both his hands. He has a positive family history of Parkinson's disease in his grandfather. He is complaining of constant dizziness and unsteadiness which are worse with activity. Patient states that he frequently loses his balance and his wife has to steady him.    Limitations Lifting;Standing;Walking;House hold activities    Diagnostic tests He has had an MRI of his brain with and without contrast on 12/18/19. Results were mildly motion degraded examination. No evidence of acute intracranial abnormality. Minimal chronic small vessel ischemic disease.    Patient Stated Goals to be able to ambulate with a cane or no AD, to be able to  drive, to be able to ride his motorcycle    Currently in Pain? Yes    Pain Score 5     Pain Location Knee    Pain Orientation Right    Pain Descriptors / Indicators Aching    Pain Type Acute pain    Pain Onset 1 to 4 weeks ago    Pain Frequency Intermittent            Treatment:  Standing next to support bar:  -2.5 ankle weight: march 10x each LE, stabilization to R knee provided, hip flexion and abduction stepping over green line 10x each LE, stabilization to R knee provided -airex pad: weight shift onto RLE 3x20 second holds very challenging for patient  Seated: -GTB hamstring curl 10x RLE -adduction isometric 12x 5 second hold into PT hand RLE -green ball between feet ; adduction with LAQ 10x  R knee assessed due to pain: noted tenderness to lateral meniscus and MCL with gross assessment.    Pt educated throughout session about proper posture and technique with exercises. Improved exercise technique, movement at target joints, use of target muscles after min to mod verbal, visual, tactile cues                          PT Education - 03/10/21 1550    Education Details exercise technique, body mechanics    Person(s) Educated Patient    Methods Explanation;Demonstration;Tactile cues;Verbal cues    Comprehension Verbalized understanding;Returned demonstration;Verbal cues required;Tactile cues required            PT Short Term Goals - 03/06/21 1601      PT SHORT TERM GOAL #1   Title Patient will be independent in home exercise program to improve strength/mobility for better functional independence with ADLs and for self-management.    Baseline 3/3 HEP compliant 3/31 hep compliant    Time 6    Period Weeks    Status Achieved    Target Date 02/20/21      PT SHORT TERM GOAL #2   Title Patient will be modified independent in walking on even/uneven surface with least restrictive assistive device, for 10+ minutes without rest break, reporting some  difficulty or less to improve walking tolerance with community ambulation including grocery shopping, going to church,etc.    Baseline 02/03: instability, LOB multiple times while ambulating to elevator 3/31: unable to test due to throwing up    Time 6    Period Weeks    Status On-going    Target Date 04/17/21      PT SHORT TERM GOAL #3   Title Patient will increase ABC scale score >80% to demonstrate better functional mobility and better confidence with ADLs.    Baseline 01/09/21: 60.625% 3/3/: 50% 3/31: 63%    Time 6    Period Weeks    Status Partially Met    Target Date 04/17/21             PT Long Term Goals - 03/06/21 1826      PT LONG TERM GOAL #1   Title Patient will increase FOTO score to equal to or greater than 65 to demonstrate statistically significant improvement in mobility and quality of life.    Baseline scored 41/100 on 05/21/2020; 7/22 40, 8/23: 60/100, 09/30/20=40, 01/09/21: 46 3/3: 40.3% 3/31: 49%    Time 12    Period Weeks    Status Partially Met    Target Date 05/29/21      PT LONG TERM GOAL #2   Title Patient will reduce falls risk as indicated by decreased TUG time to less than 11 seconds.    Baseline scored 37.9 sec with TUG on 05/21/2020; 21seconds with elevated rollator 7/22, 8/23: 13.62 sec with up and go walker, 01/09/21: 15.81s with hiking stick 3/3: 17.98 seconds one near LOB 3/31: 15.57 seconds with walking stick    Time 12    Period Weeks    Status Partially Met    Target Date 05/29/21      PT LONG TERM GOAL #3   Title Patient will increase right UE and LE gross strength to 4+/5 throughout to improve functional strength for independent gait, increased standing tolerance and increased ADL ability.    Baseline grossly +3/5 to -4/5 right UE and LE strength on 05/21/2020; grossly 4-/5 for RUE, grossly 4-/5 for  RLE on 7/22, grossly 4-/5 RUE, grossly 4/5 RLE 3/3: see note    Time 8    Period Weeks    Status Partially Met    Target Date 05/29/21       PT LONG TERM GOAL #4   Title Patient will increase 10 meter walk test to >1.33ms as to improve gait speed for better community ambulation and to reduce fall risk.    Baseline on 7/22 .57 m/sself selected, fast  .73 m/s with LOB pt reported R knee buckling, elevated rollator used, 8/23: 1.06 m/s with up and go walker, 01/09/21: 0.72 m/s with hiking stick 3/3: 0.74 m/s with walking stick 3/31: 0.81 m/s w walking stick    Time 12    Period Weeks    Status Partially Met    Target Date 05/29/21      PT LONG TERM GOAL #5   Title Patient will increase Berg Balance score by > 6 points to demonstrate decreased fall risk during functional activities.    Baseline 8/23: 37/56, 09/30/20=40/56, 12/20: 42/56 3/3: deferred 3/31 will perform next time; deferred due to throwing up    Time 8    Period Weeks    Status Deferred    Target Date 05/29/21      PT LONG TERM GOAL #6   Title Patient (< 652years old) will complete five times sit to stand test in < 10 seconds indicating an increased LE strength and improved balance.    Baseline 8/23: 22 sec, 09/30/20=13.04 sec, 12/20: 27.05 sec, 01/09/21: 18.01s 3/3: 23 seconds no UE support 3/31; deferred due to patient throwing up    Time 12    Period Weeks    Status Partially Met    Target Date 05/29/21      PT LONG TERM GOAL #7   Title Patient will improve 6 min walk test >1000 feet with LRAD for improved gait ability in community.    Baseline 8/23: not tested; 8/30 845, goal adjusted >20063f 09/30/20=600 feet - stopped test due to R ankle catching making gait unsafe, 11/25/20= 330 feet - stopped test due to severe dizziness, vomiting/nausea, sweating making gait unsafe; 01/09/21: 720 ft with no rest breaks 3/3: deferred due to nausea 3/31: deferred due to patient throwing up    Time 12    Period Weeks    Status Deferred    Target Date 05/29/21                 Plan - 03/10/21 1555    Clinical Impression Statement Patient session limited due to  having to leave early for doctor appointment as well as right knee pain. Patient and patient's wife agreeable to discuss with doctor new onset of R knee pain. Patient challenged with stabilization on RLE but remains highly motivated throughout session. The patient continues to benefit from additional skilled PT services to improve LE strength and balance for decreased fall risk and improved quality of life    Personal Factors and Comorbidities Comorbidity 3+;Time since onset of injury/illness/exacerbation    Comorbidities anxiety, back pain, COPD, headache, HTN, MI, sleep apnea, migraines    Examination-Activity Limitations Locomotion Level;Transfers;Bed Mobility;Stand;Stairs;Sleep;Squat;Bend    Examination-Participation Restrictions Driving;Medication Management    Stability/Clinical Decision Making Unstable/Unpredictable    Rehab Potential Fair    PT Frequency 2x / week    PT Duration 12 weeks    PT Treatment/Interventions ADLs/Self Care Home Management;Canal[place] Repostioning;Moist Heat;Electrical Stimulation;Gait training;Stair training;Functional mobility training;Therapeutic activities;Therapeutic exercise;Balance training;Neuromuscular re-education;Patient/family education;Manual  techniques;Passive range of motion;Vestibular    PT Next Visit Plan update HEP ea session for continued strengthening/balanceing at home    PT [place] Access Code: [email or web address] updated this session    Consulted and Agree with Plan of Care Patient;Other (Comment)   Friend          Patient will benefit from skilled therapeutic intervention in order to improve the following deficits and impairments:  Decreased activity tolerance,Decreased balance,Decreased cognition,Decreased mobility,Difficulty walking,Abnormal gait,Decreased range of motion,Dizziness,Pain,Impaired flexibility,Decreased strength,Impaired sensation,Decreased endurance,Decreased safety awareness  Visit Diagnosis: Muscle weakness  (generalized)  Unsteadiness on feet  Dizziness and giddiness     Problem List Patient Active Problem List   Diagnosis Date Noted  . Degenerative spondylolisthesis 03/25/2020  . Physical deconditioning 03/04/2020  . Lobar pneumonia, unspecified organism ([place]) 03/04/2020  . Intractable hemiplegic migraine with status migrainosus 03/02/2020  . Current tobacco use 02/05/2020  . Abnormal findings on diagnostic imaging of lung 02/05/2020  . Shortness of breath 02/05/2020  . Herniated disc, cervical 01/23/2020  . Cervical spondylosis with myelopathy and radiculopathy 01/23/2020  . Pre-operative respiratory examination 01/12/2020  . Weakness on right side of face 12/22/2019  . Osteoarthritis of spine with radiculopathy, lumbar region 12/11/2019  . Intractable migraine with aura with status migrainosus 11/09/2019  . Former smoker 08/27/2018  . Benign essential HTN 08/27/2018  . Cough productive of purulent sputum 11/08/2017  . GERD (gastroesophageal reflux disease) 05/06/2017  . Contusion of hand 04/22/2017  . BP (high blood pressure) 04/22/2017  . Oxygen desaturation 04/22/2017  . Prostate lump 04/22/2017  . Unstable angina ([place]) 04/27/2016  . Chest pain 04/24/2016  . Morbid obesity ([place]) 01/29/2016  . Elevated ALT measurement 10/30/2015  . Nonspecific elevation of levels of transaminase and lactic acid dehydrogenase (ldh) 10/30/2015  . Reduced libido 10/29/2015  . Hyperlipidemia 08/06/2015  . Anxiety 08/06/2015  . Atherosclerosis of coronary artery 08/06/2015  . Childhood asthma 08/06/2015  . Chronic obstructive pulmonary disease ([place]) 08/06/2015  . OSA (obstructive sleep apnea) 08/06/2015  . Anxiety disorder 08/06/2015  . Atherosclerosis of native coronary artery of native heart with stable angina pectoris ([place]) 08/06/2015  . Uncomplicated asthma 41/32/4401   Janna Arch, PT, DPT   03/10/2021, 3:57 PM  [place] MAIN [facility]  SERVICES [address] [place], [place], [address] Phone: [phone]   Fax:  [phone]  Name: Cory Espinoza MRN: [id number] Date of Birth: November 03, 1971

## 2021-03-13 ENCOUNTER — Other Ambulatory Visit: Payer: Self-pay

## 2021-03-13 ENCOUNTER — Ambulatory Visit: Payer: BC Managed Care – PPO

## 2021-03-17 ENCOUNTER — Ambulatory Visit: Payer: BC Managed Care – PPO

## 2021-03-17 ENCOUNTER — Other Ambulatory Visit: Payer: Self-pay

## 2021-03-17 DIAGNOSIS — R2681 Unsteadiness on feet: Secondary | ICD-10-CM

## 2021-03-17 DIAGNOSIS — M6281 Muscle weakness (generalized): Secondary | ICD-10-CM | POA: Diagnosis not present

## 2021-03-17 DIAGNOSIS — R42 Dizziness and giddiness: Secondary | ICD-10-CM

## 2021-03-17 NOTE — Therapy (Signed)
[place] MAIN [facility] SERVICES [address] [place], [place], [address] Phone: [phone]   Fax:  [phone]  Physical Therapy Treatment  Patient Details  Name: Cory Espinoza MRN: [id number] Date of Birth: 1971-09-08 Referring Provider (PT): Dr. Joselyn Arrow   Encounter Date: 03/17/2021   PT End of Session - 03/17/21 1611    Visit Number 35    Number of Visits 23    Date for PT Re-Evaluation 05/29/21    Authorization Type [employer] PPO    Authorization Time Period 01/09/21-04/03/21    PT Start Time 1515    PT Stop Time 1559    PT Time Calculation (min) 44 min    Equipment Utilized During Treatment Gait belt    Activity Tolerance Patient tolerated treatment well;No increased pain    Behavior During Therapy WFL for tasks assessed/performed           Past Medical History:  Diagnosis Date  . Allergy   . Anginal pain ([place])   . Anxiety   . Arthritis    lumbar spine  . Asthma   . Atrial fibrillation ([place])   . Back pain    Severe Lumbar Pain  . CHF (congestive heart failure) ([place])   . COPD (chronic obstructive pulmonary disease) (H[facility])    Restrictive lung disease  . Coronary artery disease   . Dyspnea   . Dysrhythmia    afib  . GERD (gastroesophageal reflux disease)   . Headache    [name], onset October 2020, cluster headaches in the past  . History of kidney stones   . Hyperlipidemia   . Hypertension   . Myocardial infarction ([place])    at age 35  . Pneumonia   . Restrictive lung disease   . Sleep apnea    unable to use cpap since onset of migraines    Past Surgical History:  Procedure Laterality Date  . ABDOMINAL EXPOSURE N/A 03/25/2020   Procedure: ABDOMINAL EXPOSURE;  Surgeon: Rosetta Posner, MD;  Location: [facility] OR;  Service: Vascular;  Laterality: N/A;  . ANTERIOR CERVICAL DECOMP/DISCECTOMY FUSION N/A 01/23/2020   Procedure: ANTERIOR CERVICAL DECOMPRESSION FUSION - CERVICAL SIX-CERVICAL SEVEN;  Surgeon: Earnie Larsson, MD;   Location: [place];  Service: Neurosurgery;  Laterality: N/A;  . ANTERIOR LUMBAR FUSION N/A 03/25/2020   Procedure: ANTERIOR LUMBAR INTERBODY FUSION LUMBAR FIVE-SACRAL ONE.;  Surgeon: Earnie Larsson, MD;  Location: [place];  Service: Neurosurgery;  Laterality: N/A;  anterior  . BACK SURGERY  1996  . CARDIAC CATHETERIZATION Left 04/27/2016   Procedure: Left Heart Cath and Coronary Angiography;  Surgeon: Corey Skains, MD;  Location: [place] [facility] LAB;  Service: Cardiovascular;  Laterality: Left;  . CARDIAC CATHETERIZATION N/A 04/27/2016   Procedure: Intravascular Pressure Wire/FFR Study;  Surgeon: Yolonda Kida, MD;  Location: [place] [facility] LAB;  Service: Cardiovascular;  Laterality: N/A;  . CATHETERIZATION OF PULMONARY ARTERY WITH RETRIEVAL OF FOREIGN BODY Bilateral 04/07/2011   heart  . DEGENERATIVE SPONDYLOLISTHESIS    . KNEE ARTHROSCOPY Bilateral 2004  . [place] SURGERY  1999  . RADICULOPATHY, CERVICAL REGION    . TONSILLECTOMY      There were no vitals filed for this visit.   Subjective Assessment - 03/17/21 1608    Subjective Patient reports compliance with HEP but had another fall due to attempting to cut bushes outside without an AD. Reports new pain in knee after second fall.    Pertinent History Pt has a  complex medical history. 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 He states that he fell 10-12 feet and struck his head, neck, and back. He was able to finish the course which took him approximately 45 minutes more to complete. He does endorse a second fall while finishing the course but did not suffer a second head injury. He states that he started getting headaches that day which have persisted since that time. 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 He does report improvement in his headaches recently. Neurology was concerned about possible BPPV and have referred  him for a vestibular evaluation. In addition since the injury, pt developed RUE and RLE pain and weakness. Pt states that NCV showed abnormal nerve conduction in RUE. He has since undergone a C7-7 ACDF on 01/23/20. He reports initial improvement in RUE strength, but states that he has a gradual return of his RUE weakness. He was also having significant RLE weakness and pain and underwent and L5-S1 anterior lumbar interbody fusion on 03/25/20. Patient reports that he does not have any back precautions but needs to wear the low back brace for one more month. Patient reports he has a follow-up appointment with the surgeon next month. Patient reports he has a follow-up appointment with Dr. Brigitte Pulse, neurologist, in August. He arrives to therapy ambulating with an upright rollator walker. Pt denies any mention of concussion after his injury. He has been having cognitive issues since his injury and has previously had a heavy metals screen as well as neurocognitive testing. He has had an MRI of his brain with and without contrast on 12/18/19. Results were mildly motion degraded examination. No evidence of acute intracranial abnormality. Minimal chronic small vessel ischemic disease. He has tremor in both his hands. He has a positive family history of Parkinson's disease in his grandfather. He is complaining of constant dizziness and unsteadiness which are worse with activity. Patient states that he frequently loses his balance and his wife has to steady him.    Limitations Lifting;Standing;Walking;House hold activities    Diagnostic tests He has had an MRI of his brain with and without contrast on 12/18/19. Results were mildly motion degraded examination. No evidence of acute intracranial abnormality. Minimal chronic small vessel ischemic disease.    Patient Stated Goals to be able to ambulate with a cane or no AD, to be able to drive, to be able to ride his motorcycle    Currently in Pain? Yes    Pain Score 6     Pain  Location Knee    Pain Orientation Right    Pain Descriptors / Indicators Aching    Pain Type Acute pain    Pain Onset 1 to 4 weeks ago    Pain Frequency Intermittent              Patient had a fall after last session. Wearing a brace on R knee.      TherEx: cues for sequencing, muscle activation technique, and body mechanics   Seated 5#;  Seated LAQ 10 reps; modified arc of motion  Seated marches x 12 reps; arms crossed for no core stabilization Seated heel raise 15x   Seated: BTB hamstring curls 12x each LE ; very challenging RLE BTB adduction 12x each LE BTB abduction 12x each LE  GTB alternating LAQ with band around ankles 10x each LE Small green ball  between ankles: LAQ with adduction squeeze 10x    Neuro Re-ed: close CGA due to stability and safety Single limb stance pressing on yelllow dynadisc 10x each LE; multiple near LOB  Step over and back horizontal half foam roller 10x no UE support Sit to stand with UE support on bar, 10x Single limb stance stabilization provided to RLE for reduction of knee buckling 3x 30 second holds    Assessment of R knee pain: noted VMO tenderness and patella irritation with quick taps indicating potential need for imaging if pain does not resolve by end of week. Wife of patient notified and agreeable.   Patient had an onset of dizziness upon standing at end of session, required patient to sit back down, reduce dizziness and then able to return to standing.    Pt educated throughout session about proper posture and technique with exercises. Improved exercise technique, movement at target joints, use of target muscles after min to mod verbal, visual, tactile cues   Patient's right knee continues to be painful at this time. New onset of fall results in new pain in VMO region and patella. Pain assessed and patient/patient's spouse agreeable to pursue imaging if pain does not resolve by end of week. He remains highly motivated throughout the  session despite pain. The patient continues to benefit from additional skilled PT services to improve LE strength and balance for decreased fall risk and improved quality of life.                      PT Education - 03/17/21 1610    Education Details exercise technique, body mechanics    Person(s) Educated Patient    Methods Explanation;Demonstration;Tactile cues;Verbal cues    Comprehension Verbalized understanding;Returned demonstration;Verbal cues required;Tactile cues required            PT Short Term Goals - 03/06/21 1601      PT SHORT TERM GOAL #1   Title Patient will be independent in home exercise program to improve strength/mobility for better functional independence with ADLs and for self-management.    Baseline 3/3 HEP compliant 3/31 hep compliant    Time 6    Period Weeks    Status Achieved    Target Date 02/20/21      PT SHORT TERM GOAL #2   Title Patient will be modified independent in walking on even/uneven surface with least restrictive assistive device, for 10+ minutes without rest break, reporting some difficulty or less to improve walking tolerance with community ambulation including grocery shopping, going to church,etc.    Baseline 02/03: instability, LOB multiple times while ambulating to elevator 3/31: unable to test due to throwing up    Time 6    Period Weeks    Status On-going    Target Date 04/17/21      PT SHORT TERM GOAL #3   Title Patient will increase ABC scale score >80% to demonstrate better functional mobility and better confidence with ADLs.    Baseline 01/09/21: 60.625% 3/3/: 50% 3/31: 63%    Time 6    Period Weeks    Status Partially Met    Target Date 04/17/21             PT Long Term Goals - 03/06/21 1826      PT LONG TERM GOAL #1   Title Patient will increase FOTO score to equal to or greater than 65 to demonstrate statistically significant improvement in mobility and quality of life.  Baseline scored 41/100 on  05/21/2020; 7/22 40, 8/23: 60/100, 09/30/20=40, 01/09/21: 46 3/3: 40.3% 3/31: 49%    Time 12    Period Weeks    Status Partially Met    Target Date 05/29/21      PT LONG TERM GOAL #2   Title Patient will reduce falls risk as indicated by decreased TUG time to less than 11 seconds.    Baseline scored 37.9 sec with TUG on 05/21/2020; 21seconds with elevated rollator 7/22, 8/23: 13.62 sec with up and go walker, 01/09/21: 15.81s with hiking stick 3/3: 17.98 seconds one near LOB 3/31: 15.57 seconds with walking stick    Time 12    Period Weeks    Status Partially Met    Target Date 05/29/21      PT LONG TERM GOAL #3   Title Patient will increase right UE and LE gross strength to 4+/5 throughout to improve functional strength for independent gait, increased standing tolerance and increased ADL ability.    Baseline grossly +3/5 to -4/5 right UE and LE strength on 05/21/2020; grossly 4-/5 for RUE, grossly 4-/5 for RLE on 7/22, grossly 4-/5 RUE, grossly 4/5 RLE 3/3: see note    Time 8    Period Weeks    Status Partially Met    Target Date 05/29/21      PT LONG TERM GOAL #4   Title Patient will increase 10 meter walk test to >1.35ms as to improve gait speed for better community ambulation and to reduce fall risk.    Baseline on 7/22 .57 m/sself selected, fast  .73 m/s with LOB pt reported R knee buckling, elevated rollator used, 8/23: 1.06 m/s with up and go walker, 01/09/21: 0.72 m/s with hiking stick 3/3: 0.74 m/s with walking stick 3/31: 0.81 m/s w walking stick    Time 12    Period Weeks    Status Partially Met    Target Date 05/29/21      PT LONG TERM GOAL #5   Title Patient will increase Berg Balance score by > 6 points to demonstrate decreased fall risk during functional activities.    Baseline 8/23: 37/56, 09/30/20=40/56, 12/20: 42/56 3/3: deferred 3/31 will perform next time; deferred due to throwing up    Time 8    Period Weeks    Status Deferred    Target Date 05/29/21      PT  LONG TERM GOAL #6   Title Patient (< 655years old) will complete five times sit to stand test in < 10 seconds indicating an increased LE strength and improved balance.    Baseline 8/23: 22 sec, 09/30/20=13.04 sec, 12/20: 27.05 sec, 01/09/21: 18.01s 3/3: 23 seconds no UE support 3/31; deferred due to patient throwing up    Time 12    Period Weeks    Status Partially Met    Target Date 05/29/21      PT LONG TERM GOAL #7   Title Patient will improve 6 min walk test >1000 feet with LRAD for improved gait ability in community.    Baseline 8/23: not tested; 8/30 845, goal adjusted >20017f 09/30/20=600 feet - stopped test due to R ankle catching making gait unsafe, 11/25/20= 330 feet - stopped test due to severe dizziness, vomiting/nausea, sweating making gait unsafe; 01/09/21: 720 ft with no rest breaks 3/3: deferred due to nausea 3/31: deferred due to patient throwing up    Time 12    Period Weeks    Status Deferred  Target Date 05/29/21                 Plan - 03/17/21 1612    Clinical Impression Statement Patient's right knee continues to be painful at this time. New onset of fall results in new pain in VMO region and patella. Pain assessed and patient/patient's spouse agreeable to pursue imaging if pain does not resolve by end of week. He remains highly motivated throughout the session despite pain. The patient continues to benefit from additional skilled PT services to improve LE strength and balance for decreased fall risk and improved quality of life.    Personal Factors and Comorbidities Comorbidity 3+;Time since onset of injury/illness/exacerbation    Comorbidities anxiety, back pain, COPD, headache, HTN, MI, sleep apnea, migraines    Examination-Activity Limitations Locomotion Level;Transfers;Bed Mobility;Stand;Stairs;Sleep;Squat;Bend    Examination-Participation Restrictions Driving;Medication Management    Stability/Clinical Decision Making Unstable/Unpredictable    Rehab  Potential Fair    PT Frequency 2x / week    PT Duration 12 weeks    PT Treatment/Interventions ADLs/Self Care Home Management;Canalith Repostioning;Moist Heat;Electrical Stimulation;Gait training;Stair training;Functional mobility training;Therapeutic activities;Therapeutic exercise;Balance training;Neuromuscular re-education;Patient/family education;Manual techniques;Passive range of motion;Vestibular    PT Next Visit Plan update HEP ea session for continued strengthening/balanceing at home    PT [place] Access Code: [email or web address] updated this session    Consulted and Agree with Plan of Care Patient;Other (Comment)   Friend          Patient will benefit from skilled therapeutic intervention in order to improve the following deficits and impairments:  Decreased activity tolerance,Decreased balance,Decreased cognition,Decreased mobility,Difficulty walking,Abnormal gait,Decreased range of motion,Dizziness,Pain,Impaired flexibility,Decreased strength,Impaired sensation,Decreased endurance,Decreased safety awareness  Visit Diagnosis: Muscle weakness (generalized)  Unsteadiness on feet  Dizziness and giddiness     Problem List Patient Active Problem List   Diagnosis Date Noted  . Degenerative spondylolisthesis 03/25/2020  . Physical deconditioning 03/04/2020  . Lobar pneumonia, unspecified organism ([place]) 03/04/2020  . Intractable hemiplegic migraine with status migrainosus 03/02/2020  . Current tobacco use 02/05/2020  . Abnormal findings on diagnostic imaging of lung 02/05/2020  . Shortness of breath 02/05/2020  . Herniated disc, cervical 01/23/2020  . Cervical spondylosis with myelopathy and radiculopathy 01/23/2020  . Pre-operative respiratory examination 01/12/2020  . Weakness on right side of face 12/22/2019  . Osteoarthritis of spine with radiculopathy, lumbar region 12/11/2019  . Intractable migraine with aura with status migrainosus 11/09/2019  . Former  smoker 08/27/2018  . Benign essential HTN 08/27/2018  . Cough productive of purulent sputum 11/08/2017  . GERD (gastroesophageal reflux disease) 05/06/2017  . Contusion of hand 04/22/2017  . BP (high blood pressure) 04/22/2017  . Oxygen desaturation 04/22/2017  . Prostate lump 04/22/2017  . Unstable angina ([place]) 04/27/2016  . Chest pain 04/24/2016  . Morbid obesity ([place]) 01/29/2016  . Elevated ALT measurement 10/30/2015  . Nonspecific elevation of levels of transaminase and lactic acid dehydrogenase (ldh) 10/30/2015  . Reduced libido 10/29/2015  . Hyperlipidemia 08/06/2015  . Anxiety 08/06/2015  . Atherosclerosis of coronary artery 08/06/2015  . Childhood asthma 08/06/2015  . Chronic obstructive pulmonary disease ([place]) 08/06/2015  . OSA (obstructive sleep apnea) 08/06/2015  . Anxiety disorder 08/06/2015  . Atherosclerosis of native coronary artery of native heart with stable angina pectoris ([place]) 08/06/2015  . Uncomplicated asthma 44/12/270   Janna Arch, PT, DPT   03/17/2021, 4:13 PM  [place] MAIN [facility] SERVICES [address] [place], [place], [address] Phone: [phone]  Fax:  [phone]  Name: Cory Espinoza MRN: [id number] Date of Birth: 1971/06/01

## 2021-03-20 ENCOUNTER — Other Ambulatory Visit: Payer: Self-pay

## 2021-03-20 ENCOUNTER — Ambulatory Visit: Payer: BC Managed Care – PPO

## 2021-03-20 DIAGNOSIS — R2681 Unsteadiness on feet: Secondary | ICD-10-CM

## 2021-03-20 DIAGNOSIS — M6281 Muscle weakness (generalized): Secondary | ICD-10-CM | POA: Diagnosis not present

## 2021-03-20 DIAGNOSIS — R42 Dizziness and giddiness: Secondary | ICD-10-CM

## 2021-03-20 NOTE — Therapy (Signed)
[place] MAIN [facility] SERVICES [address] [place], [place], [address] Phone: [phone]   Fax:  [phone]  Physical Therapy Treatment  Patient Details  Name: Cory Espinoza MRN: [id number] Date of Birth: 06-Mar-1971 Referring Provider (PT): Dr. Joselyn Arrow   Encounter Date: 03/20/2021   PT End of Session - 03/20/21 1657    Visit Number 36    Number of Visits 19    Date for PT Re-Evaluation 05/29/21    Authorization Type [employer] PPO    Authorization Time Period 01/09/21-04/03/21    PT Start Time 1600    PT Stop Time 1646    PT Time Calculation (min) 46 min    Equipment Utilized During Treatment Gait belt    Activity Tolerance Patient tolerated treatment well;No increased pain    Behavior During Therapy WFL for tasks assessed/performed           Past Medical History:  Diagnosis Date  . Allergy   . Anginal pain ([place])   . Anxiety   . Arthritis    lumbar spine  . Asthma   . Atrial fibrillation ([place])   . Back pain    Severe Lumbar Pain  . CHF (congestive heart failure) ([place])   . COPD (chronic obstructive pulmonary disease) (H[facility])    Restrictive lung disease  . Coronary artery disease   . Dyspnea   . Dysrhythmia    afib  . GERD (gastroesophageal reflux disease)   . Headache    [name], onset October 2020, cluster headaches in the past  . History of kidney stones   . Hyperlipidemia   . Hypertension   . Myocardial infarction ([place])    at age 10  . Pneumonia   . Restrictive lung disease   . Sleep apnea    unable to use cpap since onset of migraines    Past Surgical History:  Procedure Laterality Date  . ABDOMINAL EXPOSURE N/A 03/25/2020   Procedure: ABDOMINAL EXPOSURE;  Surgeon: Rosetta Posner, MD;  Location: [facility] OR;  Service: Vascular;  Laterality: N/A;  . ANTERIOR CERVICAL DECOMP/DISCECTOMY FUSION N/A 01/23/2020   Procedure: ANTERIOR CERVICAL DECOMPRESSION FUSION - CERVICAL SIX-CERVICAL SEVEN;  Surgeon: Earnie Larsson, MD;   Location: [place];  Service: Neurosurgery;  Laterality: N/A;  . ANTERIOR LUMBAR FUSION N/A 03/25/2020   Procedure: ANTERIOR LUMBAR INTERBODY FUSION LUMBAR FIVE-SACRAL ONE.;  Surgeon: Earnie Larsson, MD;  Location: [place];  Service: Neurosurgery;  Laterality: N/A;  anterior  . BACK SURGERY  1996  . CARDIAC CATHETERIZATION Left 04/27/2016   Procedure: Left Heart Cath and Coronary Angiography;  Surgeon: Corey Skains, MD;  Location: [place] [facility];  Service: Cardiovascular;  Laterality: Left;  . CARDIAC CATHETERIZATION N/A 04/27/2016   Procedure: Intravascular Pressure Wire/FFR Study;  Surgeon: Yolonda Kida, MD;  Location: [place] [facility];  Service: Cardiovascular;  Laterality: N/A;  . CATHETERIZATION OF PULMONARY ARTERY WITH RETRIEVAL OF FOREIGN BODY Bilateral 04/07/2011   heart  . DEGENERATIVE SPONDYLOLISTHESIS    . KNEE ARTHROSCOPY Bilateral 2004  . [place] SURGERY  1999  . RADICULOPATHY, CERVICAL REGION    . TONSILLECTOMY      There were no vitals filed for this visit.   Subjective Assessment - 03/20/21 1656    Subjective Patient had a seizure yesterday while sitting at kitchen table. Did not fall to floor, did not hit head. Patient does not remember day but friend who helped drive him here had information.  Pertinent History Pt has a complex medical history. 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 He states that he fell 10-12 feet and struck his head, neck, and back. He was able to finish the course which took him approximately 45 minutes more to complete. He does endorse a second fall while finishing the course but did not suffer a second head injury. He states that he started getting headaches that day which have persisted since that time. 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 He does report improvement in his headaches recently. Neurology was concerned about  possible BPPV and have referred him for a vestibular evaluation. In addition since the injury, pt developed RUE and RLE pain and weakness. Pt states that NCV showed abnormal nerve conduction in RUE. He has since undergone a C7-7 ACDF on 01/23/20. He reports initial improvement in RUE strength, but states that he has a gradual return of his RUE weakness. He was also having significant RLE weakness and pain and underwent and L5-S1 anterior lumbar interbody fusion on 03/25/20. Patient reports that he does not have any back precautions but needs to wear the low back brace for one more month. Patient reports he has a follow-up appointment with the surgeon next month. Patient reports he has a follow-up appointment with Dr. Brigitte Pulse, neurologist, in August. He arrives to therapy ambulating with an upright rollator walker. Pt denies any mention of concussion after his injury. He has been having cognitive issues since his injury and has previously had a heavy metals screen as well as neurocognitive testing. He has had an MRI of his brain with and without contrast on 12/18/19. Results were mildly motion degraded examination. No evidence of acute intracranial abnormality. Minimal chronic small vessel ischemic disease. He has tremor in both his hands. He has a positive family history of Parkinson's disease in his grandfather. He is complaining of constant dizziness and unsteadiness which are worse with activity. Patient states that he frequently loses his balance and his wife has to steady him.    Limitations Lifting;Standing;Walking;House hold activities    Diagnostic tests He has had an MRI of his brain with and without contrast on 12/18/19. Results were mildly motion degraded examination. No evidence of acute intracranial abnormality. Minimal chronic small vessel ischemic disease.    Patient Stated Goals to be able to ambulate with a cane or no AD, to be able to drive, to be able to ride his motorcycle    Currently in Pain? Yes     Pain Score 5     Pain Location Head    Pain Orientation Mid    Pain Descriptors / Indicators Pounding    Pain Type Chronic pain    Pain Onset 1 to 4 weeks ago    Pain Frequency Constant               in // bars:  Forward step over and back half foam roller Lateral step over and back half foam roller 10x each LE  airex balance beam: lateral stepping 6x length of // bars airex balance beam: tandem walk: terminated due to increased nausea with looking at feet.    3lb weights in standing in // bars:  Forward march, backwards ambulation one time LOB; 4x length of // bars  lateral stepping 4x length of // bars Hamstring curl 12x each LE, stabilization to R knee  provided  Seated:  3lb weight: -LAQ 10x each LE: partial arc of motion RLE due to pain -March 10x each LE RTB adduction 15x RTB pf RLE 15x  seated cross body punches for coordination, spatial awareness and sequencing with visual tracking 2x 1.5 minutes     Pt educated throughout session about proper posture and technique with exercises. Improved exercise technique, movement at target joints, use of target muscles after min to mod verbal, visual, tactile cues    Patient remains highly motivated throughout physical therapy session. He is challenged with visual scanning in seated and standing which will continue to be an area of focus. Unstable surfaces requires UE support at this time with patient having increased demand on ankle righting reactions. R knee pain continues to improve at this time. The patient continues to benefit from additional skilled PT services to improve LE strength and balance for decreased fall risk and improved quality of life            PT Education - 03/20/21 1657    Education Details exercise technique, body mechanics, visual scan    Person(s) Educated Patient    Methods Explanation;Demonstration;Tactile cues;Verbal cues    Comprehension Verbalized understanding;Returned  demonstration;Verbal cues required;Tactile cues required            PT Short Term Goals - 03/06/21 1601      PT SHORT TERM GOAL #1   Title Patient will be independent in home exercise program to improve strength/mobility for better functional independence with ADLs and for self-management.    Baseline 3/3 HEP compliant 3/31 hep compliant    Time 6    Period Weeks    Status Achieved    Target Date 02/20/21      PT SHORT TERM GOAL #2   Title Patient will be modified independent in walking on even/uneven surface with least restrictive assistive device, for 10+ minutes without rest break, reporting some difficulty or less to improve walking tolerance with community ambulation including grocery shopping, going to church,etc.    Baseline 02/03: instability, LOB multiple times while ambulating to elevator 3/31: unable to test due to throwing up    Time 6    Period Weeks    Status On-going    Target Date 04/17/21      PT SHORT TERM GOAL #3   Title Patient will increase ABC scale score >80% to demonstrate better functional mobility and better confidence with ADLs.    Baseline 01/09/21: 60.625% 3/3/: 50% 3/31: 63%    Time 6    Period Weeks    Status Partially Met    Target Date 04/17/21             PT Long Term Goals - 03/06/21 1826      PT LONG TERM GOAL #1   Title Patient will increase FOTO score to equal to or greater than 65 to demonstrate statistically significant improvement in mobility and quality of life.    Baseline scored 41/100 on 05/21/2020; 7/22 40, 8/23: 60/100, 09/30/20=40, 01/09/21: 46 3/3: 40.3% 3/31: 49%    Time 12    Period Weeks    Status Partially Met    Target Date 05/29/21      PT LONG TERM GOAL #2   Title Patient will reduce falls risk as indicated by decreased TUG time to less than 11 seconds.    Baseline scored 37.9 sec with TUG on 05/21/2020; 21seconds with elevated rollator 7/22, 8/23: 13.62 sec with up and go walker,  01/09/21: 15.81s with hiking stick  3/3: 17.98 seconds one near LOB 3/31: 15.57 seconds with walking stick    Time 12    Period Weeks    Status Partially Met    Target Date 05/29/21      PT LONG TERM GOAL #3   Title Patient will increase right UE and LE gross strength to 4+/5 throughout to improve functional strength for independent gait, increased standing tolerance and increased ADL ability.    Baseline grossly +3/5 to -4/5 right UE and LE strength on 05/21/2020; grossly 4-/5 for RUE, grossly 4-/5 for RLE on 7/22, grossly 4-/5 RUE, grossly 4/5 RLE 3/3: see note    Time 8    Period Weeks    Status Partially Met    Target Date 05/29/21      PT LONG TERM GOAL #4   Title Patient will increase 10 meter walk test to >1.1ms as to improve gait speed for better community ambulation and to reduce fall risk.    Baseline on 7/22 .57 m/sself selected, fast  .73 m/s with LOB pt reported R knee buckling, elevated rollator used, 8/23: 1.06 m/s with up and go walker, 01/09/21: 0.72 m/s with hiking stick 3/3: 0.74 m/s with walking stick 3/31: 0.81 m/s w walking stick    Time 12    Period Weeks    Status Partially Met    Target Date 05/29/21      PT LONG TERM GOAL #5   Title Patient will increase Berg Balance score by > 6 points to demonstrate decreased fall risk during functional activities.    Baseline 8/23: 37/56, 09/30/20=40/56, 12/20: 42/56 3/3: deferred 3/31 will perform next time; deferred due to throwing up    Time 8    Period Weeks    Status Deferred    Target Date 05/29/21      PT LONG TERM GOAL #6   Title Patient (< 644years old) will complete five times sit to stand test in < 10 seconds indicating an increased LE strength and improved balance.    Baseline 8/23: 22 sec, 09/30/20=13.04 sec, 12/20: 27.05 sec, 01/09/21: 18.01s 3/3: 23 seconds no UE support 3/31; deferred due to patient throwing up    Time 12    Period Weeks    Status Partially Met    Target Date 05/29/21      PT LONG TERM GOAL #7   Title Patient will  improve 6 min walk test >1000 feet with LRAD for improved gait ability in community.    Baseline 8/23: not tested; 8/30 845, goal adjusted >20084f 09/30/20=600 feet - stopped test due to R ankle catching making gait unsafe, 11/25/20= 330 feet - stopped test due to severe dizziness, vomiting/nausea, sweating making gait unsafe; 01/09/21: 720 ft with no rest breaks 3/3: deferred due to nausea 3/31: deferred due to patient throwing up    Time 12    Period Weeks    Status Deferred    Target Date 05/29/21                 Plan - 03/20/21 1658    Clinical Impression Statement Patient remains highly motivated throughout physical therapy session. He is challenged with visual scanning in seated and standing which will continue to be an area of focus. Unstable surfaces requires UE support at this time with patient having increased demand on ankle righting reactions. R knee pain continues to improve at this time. The patient continues to benefit from additional skilled  PT services to improve LE strength and balance for decreased fall risk and improved quality of life    Personal Factors and Comorbidities Comorbidity 3+;Time since onset of injury/illness/exacerbation    Comorbidities anxiety, back pain, COPD, headache, HTN, MI, sleep apnea, migraines    Examination-Activity Limitations Locomotion Level;Transfers;Bed Mobility;Stand;Stairs;Sleep;Squat;Bend    Examination-Participation Restrictions Driving;Medication Management    Stability/Clinical Decision Making Unstable/Unpredictable    Rehab Potential Fair    PT Frequency 2x / week    PT Duration 12 weeks    PT Treatment/Interventions ADLs/Self Care Home Management;Canalith Repostioning;Moist Heat;Electrical Stimulation;Gait training;Stair training;Functional mobility training;Therapeutic activities;Therapeutic exercise;Balance training;Neuromuscular re-education;Patient/family education;Manual techniques;Passive range of motion;Vestibular    PT  Next Visit Plan update HEP ea session for continued strengthening/balanceing at home    PT [place] Access Code: [email or web address] updated this session    Consulted and Agree with Plan of Care Patient;Other (Comment)   Friend          Patient will benefit from skilled therapeutic intervention in order to improve the following deficits and impairments:  Decreased activity tolerance,Decreased balance,Decreased cognition,Decreased mobility,Difficulty walking,Abnormal gait,Decreased range of motion,Dizziness,Pain,Impaired flexibility,Decreased strength,Impaired sensation,Decreased endurance,Decreased safety awareness  Visit Diagnosis: Muscle weakness (generalized)  Unsteadiness on feet  Dizziness and giddiness     Problem List Patient Active Problem List   Diagnosis Date Noted  . Degenerative spondylolisthesis 03/25/2020  . Physical deconditioning 03/04/2020  . Lobar pneumonia, unspecified organism ([place]) 03/04/2020  . Intractable hemiplegic migraine with status migrainosus 03/02/2020  . Current tobacco use 02/05/2020  . Abnormal findings on diagnostic imaging of lung 02/05/2020  . Shortness of breath 02/05/2020  . Herniated disc, cervical 01/23/2020  . Cervical spondylosis with myelopathy and radiculopathy 01/23/2020  . Pre-operative respiratory examination 01/12/2020  . Weakness on right side of face 12/22/2019  . Osteoarthritis of spine with radiculopathy, lumbar region 12/11/2019  . Intractable migraine with aura with status migrainosus 11/09/2019  . Former smoker 08/27/2018  . Benign essential HTN 08/27/2018  . Cough productive of purulent sputum 11/08/2017  . GERD (gastroesophageal reflux disease) 05/06/2017  . Contusion of hand 04/22/2017  . BP (high blood pressure) 04/22/2017  . Oxygen desaturation 04/22/2017  . Prostate lump 04/22/2017  . Unstable angina ([place]) 04/27/2016  . Chest pain 04/24/2016  . Morbid obesity ([place]) 01/29/2016  . Elevated ALT  measurement 10/30/2015  . Nonspecific elevation of levels of transaminase and lactic acid dehydrogenase (ldh) 10/30/2015  . Reduced libido 10/29/2015  . Hyperlipidemia 08/06/2015  . Anxiety 08/06/2015  . Atherosclerosis of coronary artery 08/06/2015  . Childhood asthma 08/06/2015  . Chronic obstructive pulmonary disease ([place]) 08/06/2015  . OSA (obstructive sleep apnea) 08/06/2015  . Anxiety disorder 08/06/2015  . Atherosclerosis of native coronary artery of native heart with stable angina pectoris ([place]) 08/06/2015  . Uncomplicated asthma 67/59/1638    Janna Arch, PT, DPT   03/20/2021, 5:00 PM  [place] MAIN [facility] SERVICES [address] [place], [place], [address] Phone: [phone]   Fax:  [phone]  Name: Cory Espinoza MRN: [id number] Date of Birth: 06-04-1971

## 2021-03-24 ENCOUNTER — Ambulatory Visit: Payer: BC Managed Care – PPO

## 2021-03-27 ENCOUNTER — Ambulatory Visit: Payer: BC Managed Care – PPO

## 2021-03-27 ENCOUNTER — Other Ambulatory Visit: Payer: Self-pay

## 2021-03-27 DIAGNOSIS — R2681 Unsteadiness on feet: Secondary | ICD-10-CM

## 2021-03-27 DIAGNOSIS — M6281 Muscle weakness (generalized): Secondary | ICD-10-CM | POA: Diagnosis not present

## 2021-03-27 DIAGNOSIS — R42 Dizziness and giddiness: Secondary | ICD-10-CM

## 2021-03-27 NOTE — Therapy (Signed)
[place] MAIN [facility] SERVICES [address] [place], [place], [address] Phone: [phone]   Fax:  [phone]  Physical Therapy Treatment  Patient Details  Name: Cory Espinoza MRN: [id number] Date of Birth: 03/27/1971 Referring Provider (PT): Dr. Joselyn Arrow   Encounter Date: 03/27/2021   PT End of Session - 03/27/21 1511    Visit Number 37    Number of Visits 2    Date for PT Re-Evaluation 05/29/21    Authorization Type [employer] PPO    Authorization Time Period 01/09/21-04/03/21    PT Start Time 1515    PT Stop Time 1559    PT Time Calculation (min) 44 min    Equipment Utilized During Treatment Gait belt    Activity Tolerance Patient tolerated treatment well;No increased pain    Behavior During Therapy WFL for tasks assessed/performed           Past Medical History:  Diagnosis Date  . Allergy   . Anginal pain ([place])   . Anxiety   . Arthritis    lumbar spine  . Asthma   . Atrial fibrillation ([place])   . Back pain    Severe Lumbar Pain  . CHF (congestive heart failure) ([place])   . COPD (chronic obstructive pulmonary disease) ([facility])    Restrictive lung disease  . Coronary artery disease   . Dyspnea   . Dysrhythmia    afib  . GERD (gastroesophageal reflux disease)   . Headache    [name], onset October 2020, cluster headaches in the past  . History of kidney stones   . Hyperlipidemia   . Hypertension   . Myocardial infarction ([place])    at age 89  . Pneumonia   . Restrictive lung disease   . Sleep apnea    unable to use cpap since onset of migraines    Past Surgical History:  Procedure Laterality Date  . ABDOMINAL EXPOSURE N/A 03/25/2020   Procedure: ABDOMINAL EXPOSURE;  Surgeon: Rosetta Posner, MD;  Location: [facility] OR;  Service: Vascular;  Laterality: N/A;  . ANTERIOR CERVICAL DECOMP/DISCECTOMY FUSION N/A 01/23/2020   Procedure: ANTERIOR CERVICAL DECOMPRESSION FUSION - CERVICAL SIX-CERVICAL SEVEN;  Surgeon: Earnie Larsson, MD;   Location: [place];  Service: Neurosurgery;  Laterality: N/A;  . ANTERIOR LUMBAR FUSION N/A 03/25/2020   Procedure: ANTERIOR LUMBAR INTERBODY FUSION LUMBAR FIVE-SACRAL ONE.;  Surgeon: Earnie Larsson, MD;  Location: [place];  Service: Neurosurgery;  Laterality: N/A;  anterior  . BACK SURGERY  1996  . CARDIAC CATHETERIZATION Left 04/27/2016   Procedure: Left Heart Cath and Coronary Angiography;  Surgeon: Corey Skains, MD;  Location: [place] CV LAB;  Service: Cardiovascular;  Laterality: Left;  . CARDIAC CATHETERIZATION N/A 04/27/2016   Procedure: Intravascular Pressure Wire/FFR Study;  Surgeon: Yolonda Kida, MD;  Location: [place] CV LAB;  Service: Cardiovascular;  Laterality: N/A;  . CATHETERIZATION OF PULMONARY ARTERY WITH RETRIEVAL OF FOREIGN BODY Bilateral 04/07/2011   heart  . DEGENERATIVE SPONDYLOLISTHESIS    . KNEE ARTHROSCOPY Bilateral 2004  . [place] SURGERY  1999  . RADICULOPATHY, CERVICAL REGION    . TONSILLECTOMY      There were no vitals filed for this visit.   Subjective Assessment - 03/27/21 1721    Subjective Patient hit his head yesterday when he fell in the bathroom. Patient brings disability paperwork in.    Pertinent History Pt has a complex medical history. 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 He states that he fell 10-12 feet and struck his head, neck, and back. He was able to finish the course which took him approximately 45 minutes more to complete. He does endorse a second fall while finishing the course but did not suffer a second head injury. He states that he started getting headaches that day which have persisted since that time. 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 He does report improvement in his headaches recently. Neurology was concerned about possible BPPV and have referred him for a vestibular evaluation. In addition since the  injury, pt developed RUE and RLE pain and weakness. Pt states that NCV showed abnormal nerve conduction in RUE. He has since undergone a C7-7 ACDF on 01/23/20. He reports initial improvement in RUE strength, but states that he has a gradual return of his RUE weakness. He was also having significant RLE weakness and pain and underwent and L5-S1 anterior lumbar interbody fusion on 03/25/20. Patient reports that he does not have any back precautions but needs to wear the low back brace for one more month. Patient reports he has a follow-up appointment with the surgeon next month. Patient reports he has a follow-up appointment with Dr. Brigitte Pulse, neurologist, in August. He arrives to therapy ambulating with an upright rollator walker. Pt denies any mention of concussion after his injury. He has been having cognitive issues since his injury and has previously had a heavy metals screen as well as neurocognitive testing. He has had an MRI of his brain with and without contrast on 12/18/19. Results were mildly motion degraded examination. No evidence of acute intracranial abnormality. Minimal chronic small vessel ischemic disease. He has tremor in both his hands. He has a positive family history of Parkinson's disease in his grandfather. He is complaining of constant dizziness and unsteadiness which are worse with activity. Patient states that he frequently loses his balance and his wife has to steady him.    Limitations Lifting;Standing;Walking;House hold activities    Diagnostic tests He has had an MRI of his brain with and without contrast on 12/18/19. Results were mildly motion degraded examination. No evidence of acute intracranial abnormality. Minimal chronic small vessel ischemic disease.    Patient Stated Goals to be able to ambulate with a cane or no AD, to be able to drive, to be able to ride his motorcycle    Currently in Pain? Yes    Pain Score 6     Pain Location Head    Pain Descriptors / Indicators Pounding     Pain Type Chronic pain    Pain Onset 1 to 4 weeks ago    Pain Frequency Constant                in // bars:  airex pad: 6" step modified tandem stance 2x 30 seconds each LE airex pad 6" step sandwhich, lateral step over and back 8x each side.  Forward step over and back orange hurdle 10x each LE  Lateral step over and back orange hurdle 10x each LE   3lb weights in standing in // bars:  Forward march, backwards ambulation one time LOB; 6x length of // bars  lateral stepping 4x length of // bars Hamstring curl 12x each LE, stabilization to R knee provided   Seated:  3lb weight: -LAQ 15x each LE: very fatiguing -March 10x each LE -IR/ER 15x  -  soccer ball squeeze 15x GTB abduction 20x GTB marching 20x each LE  Unable to do concussion screening due to increased nausea with light.   seated cross body punches for coordination, spatial awareness and sequencing with visual tracking 2x 1.5 minutes     Pt educated throughout session about proper posture and technique with exercises. Improved exercise technique, movement at target joints, use of target muscles after min to mod verbal, visual, tactile cues   Patient required prolonged rest break due to dizziness and nausea requiring emesis bag. Patient remains highly motivated despite fatigue, nausea, and dizziness. He is challenged with prolonged muscle recruitment of affected limb however pain in knee is reducing indicating good prognosis.  The patient continues to benefit from additional skilled PT services to improve LE strength and balance for decreased fall risk and improved quality of life                      PT Education - 03/27/21 1510    Education Details exercise technique, body mechanics    Person(s) Educated Patient    Methods Explanation;Demonstration;Tactile cues;Verbal cues    Comprehension Verbalized understanding;Returned demonstration;Verbal cues required;Tactile cues required            PT  Short Term Goals - 03/06/21 1601      PT SHORT TERM GOAL #1   Title Patient will be independent in home exercise program to improve strength/mobility for better functional independence with ADLs and for self-management.    Baseline 3/3 HEP compliant 3/31 hep compliant    Time 6    Period Weeks    Status Achieved    Target Date 02/20/21      PT SHORT TERM GOAL #2   Title Patient will be modified independent in walking on even/uneven surface with least restrictive assistive device, for 10+ minutes without rest break, reporting some difficulty or less to improve walking tolerance with community ambulation including grocery shopping, going to church,etc.    Baseline 02/03: instability, LOB multiple times while ambulating to elevator 3/31: unable to test due to throwing up    Time 6    Period Weeks    Status On-going    Target Date 04/17/21      PT SHORT TERM GOAL #3   Title Patient will increase ABC scale score >80% to demonstrate better functional mobility and better confidence with ADLs.    Baseline 01/09/21: 60.625% 3/3/: 50% 3/31: 63%    Time 6    Period Weeks    Status Partially Met    Target Date 04/17/21             PT Long Term Goals - 03/06/21 1826      PT LONG TERM GOAL #1   Title Patient will increase FOTO score to equal to or greater than 65 to demonstrate statistically significant improvement in mobility and quality of life.    Baseline scored 41/100 on 05/21/2020; 7/22 40, 8/23: 60/100, 09/30/20=40, 01/09/21: 46 3/3: 40.3% 3/31: 49%    Time 12    Period Weeks    Status Partially Met    Target Date 05/29/21      PT LONG TERM GOAL #2   Title Patient will reduce falls risk as indicated by decreased TUG time to less than 11 seconds.    Baseline scored 37.9 sec with TUG on 05/21/2020; 21seconds with elevated rollator 7/22, 8/23: 13.62 sec with up and go walker, 01/09/21: 15.81s with hiking stick 3/3: 17.98 seconds  one near LOB 3/31: 15.57 seconds with walking stick     Time 12    Period Weeks    Status Partially Met    Target Date 05/29/21      PT LONG TERM GOAL #3   Title Patient will increase right UE and LE gross strength to 4+/5 throughout to improve functional strength for independent gait, increased standing tolerance and increased ADL ability.    Baseline grossly +3/5 to -4/5 right UE and LE strength on 05/21/2020; grossly 4-/5 for RUE, grossly 4-/5 for RLE on 7/22, grossly 4-/5 RUE, grossly 4/5 RLE 3/3: see note    Time 8    Period Weeks    Status Partially Met    Target Date 05/29/21      PT LONG TERM GOAL #4   Title Patient will increase 10 meter walk test to >1.45ms as to improve gait speed for better community ambulation and to reduce fall risk.    Baseline on 7/22 .57 m/sself selected, fast  .73 m/s with LOB pt reported R knee buckling, elevated rollator used, 8/23: 1.06 m/s with up and go walker, 01/09/21: 0.72 m/s with hiking stick 3/3: 0.74 m/s with walking stick 3/31: 0.81 m/s w walking stick    Time 12    Period Weeks    Status Partially Met    Target Date 05/29/21      PT LONG TERM GOAL #5   Title Patient will increase Berg Balance score by > 6 points to demonstrate decreased fall risk during functional activities.    Baseline 8/23: 37/56, 09/30/20=40/56, 12/20: 42/56 3/3: deferred 3/31 will perform next time; deferred due to throwing up    Time 8    Period Weeks    Status Deferred    Target Date 05/29/21      PT LONG TERM GOAL #6   Title Patient (< 678years old) will complete five times sit to stand test in < 10 seconds indicating an increased LE strength and improved balance.    Baseline 8/23: 22 sec, 09/30/20=13.04 sec, 12/20: 27.05 sec, 01/09/21: 18.01s 3/3: 23 seconds no UE support 3/31; deferred due to patient throwing up    Time 12    Period Weeks    Status Partially Met    Target Date 05/29/21      PT LONG TERM GOAL #7   Title Patient will improve 6 min walk test >1000 feet with LRAD for improved gait ability in  community.    Baseline 8/23: not tested; 8/30 845, goal adjusted >20065f 09/30/20=600 feet - stopped test due to R ankle catching making gait unsafe, 11/25/20= 330 feet - stopped test due to severe dizziness, vomiting/nausea, sweating making gait unsafe; 01/09/21: 720 ft with no rest breaks 3/3: deferred due to nausea 3/31: deferred due to patient throwing up    Time 12    Period Weeks    Status Deferred    Target Date 05/29/21                 Plan - 03/27/21 1723    Clinical Impression Statement Patient required prolonged rest break due to dizziness and nausea requiring emesis bag. Patient remains highly motivated despite fatigue, nausea, and dizziness. He is challenged with prolonged muscle recruitment of affected limb however pain in knee is reducing indicating good prognosis.  The patient continues to benefit from additional skilled PT services to improve LE strength and balance for decreased fall risk and improved quality of life  Personal Factors and Comorbidities Comorbidity 3+;Time since onset of injury/illness/exacerbation    Comorbidities anxiety, back pain, COPD, headache, HTN, MI, sleep apnea, migraines    Examination-Activity Limitations Locomotion Level;Transfers;Bed Mobility;Stand;Stairs;Sleep;Squat;Bend    Examination-Participation Restrictions Driving;Medication Management    Stability/Clinical Decision Making Unstable/Unpredictable    Rehab Potential Fair    PT Frequency 2x / week    PT Duration 12 weeks    PT Treatment/Interventions ADLs/Self Care Home Management;Canalith Repostioning;Moist Heat;Electrical Stimulation;Gait training;Stair training;Functional mobility training;Therapeutic activities;Therapeutic exercise;Balance training;Neuromuscular re-education;Patient/family education;Manual techniques;Passive range of motion;Vestibular    PT Next Visit Plan update HEP ea session for continued strengthening/balanceing at home    PT [place]  Access Code: [email or web address] updated this session    Consulted and Agree with Plan of Care Patient;Other (Comment)   Friend          Patient will benefit from skilled therapeutic intervention in order to improve the following deficits and impairments:  Decreased activity tolerance,Decreased balance,Decreased cognition,Decreased mobility,Difficulty walking,Abnormal gait,Decreased range of motion,Dizziness,Pain,Impaired flexibility,Decreased strength,Impaired sensation,Decreased endurance,Decreased safety awareness  Visit Diagnosis: Muscle weakness (generalized)  Unsteadiness on feet  Dizziness and giddiness     Problem List Patient Active Problem List   Diagnosis Date Noted  . Degenerative spondylolisthesis 03/25/2020  . Physical deconditioning 03/04/2020  . Lobar pneumonia, unspecified organism ([place]) 03/04/2020  . Intractable hemiplegic migraine with status migrainosus 03/02/2020  . Current tobacco use 02/05/2020  . Abnormal findings on diagnostic imaging of lung 02/05/2020  . Shortness of breath 02/05/2020  . Herniated disc, cervical 01/23/2020  . Cervical spondylosis with myelopathy and radiculopathy 01/23/2020  . Pre-operative respiratory examination 01/12/2020  . Weakness on right side of face 12/22/2019  . Osteoarthritis of spine with radiculopathy, lumbar region 12/11/2019  . Intractable migraine with aura with status migrainosus 11/09/2019  . Former smoker 08/27/2018  . Benign essential HTN 08/27/2018  . Cough productive of purulent sputum 11/08/2017  . GERD (gastroesophageal reflux disease) 05/06/2017  . Contusion of hand 04/22/2017  . BP (high blood pressure) 04/22/2017  . Oxygen desaturation 04/22/2017  . Prostate lump 04/22/2017  . Unstable angina ([place]) 04/27/2016  . Chest pain 04/24/2016  . Morbid obesity ([place]) 01/29/2016  . Elevated ALT measurement 10/30/2015  . Nonspecific elevation of levels of transaminase and lactic acid dehydrogenase (ldh) 10/30/2015  .  Reduced libido 10/29/2015  . Hyperlipidemia 08/06/2015  . Anxiety 08/06/2015  . Atherosclerosis of coronary artery 08/06/2015  . Childhood asthma 08/06/2015  . Chronic obstructive pulmonary disease ([place]) 08/06/2015  . OSA (obstructive sleep apnea) 08/06/2015  . Anxiety disorder 08/06/2015  . Atherosclerosis of native coronary artery of native heart with stable angina pectoris ([place]) 08/06/2015  . Uncomplicated asthma 51/09/2110   Janna Arch, PT, DPT   03/27/2021, 5:24 PM  [place] MAIN [facility] SERVICES [address] [place], [place], [address] Phone: [phone]   Fax:  [phone]  Name: Cory Espinoza MRN: [id number] Date of Birth: 01/09/1971

## 2021-03-31 ENCOUNTER — Other Ambulatory Visit: Payer: Self-pay

## 2021-03-31 ENCOUNTER — Ambulatory Visit: Payer: BC Managed Care – PPO

## 2021-03-31 DIAGNOSIS — R2681 Unsteadiness on feet: Secondary | ICD-10-CM

## 2021-03-31 DIAGNOSIS — R42 Dizziness and giddiness: Secondary | ICD-10-CM

## 2021-03-31 DIAGNOSIS — M6281 Muscle weakness (generalized): Secondary | ICD-10-CM

## 2021-03-31 NOTE — Therapy (Signed)
[place] MAIN [facility] SERVICES [address] [place], [place], [address] Phone: [phone]   Fax:  [phone]  Physical Therapy Treatment  Patient Details  Name: Cory Espinoza MRN: [id number] Date of Birth: [birth year] Referring Provider (PT): Dr. Joselyn Arrow   Encounter Date: 03/31/2021   PT End of Session - 03/31/21 1511    Visit Number 38    Number of Visits 11    Date for PT Re-Evaluation 05/29/21    Authorization Type [employer] PPO    Authorization Time Period 01/09/21-04/03/21    PT Start Time 1514    PT Stop Time 1559    PT Time Calculation (min) 45 min    Equipment Utilized During Treatment Gait belt    Activity Tolerance Patient tolerated treatment well;No increased pain    Behavior During Therapy WFL for tasks assessed/performed           Past Medical History:  Diagnosis Date  . Allergy   . Anginal pain ([place])   . Anxiety   . Arthritis    lumbar spine  . Asthma   . Atrial fibrillation ([place])   . Back pain    Severe Lumbar Pain  . CHF (congestive heart failure) ([place])   . COPD (chronic obstructive pulmonary disease) ([facility])    Restrictive lung disease  . Coronary artery disease   . Dyspnea   . Dysrhythmia    afib  . GERD (gastroesophageal reflux disease)   . Headache    [name], onset October 2020, cluster headaches in the past  . History of kidney stones   . Hyperlipidemia   . Hypertension   . Myocardial infarction ([place])    at age 51  . Pneumonia   . Restrictive lung disease   . Sleep apnea    unable to use cpap since onset of migraines    Past Surgical History:  Procedure Laterality Date  . ABDOMINAL EXPOSURE N/A 03/25/2020   Procedure: ABDOMINAL EXPOSURE;  Surgeon: Rosetta Posner, MD;  Location: [facility] OR;  Service: Vascular;  Laterality: N/A;  . ANTERIOR CERVICAL DECOMP/DISCECTOMY FUSION N/A 01/23/2020   Procedure: ANTERIOR CERVICAL DECOMPRESSION FUSION - CERVICAL SIX-CERVICAL SEVEN;  Surgeon: Earnie Larsson, MD;   Location: [place];  Service: Neurosurgery;  Laterality: N/A;  . ANTERIOR LUMBAR FUSION N/A 03/25/2020   Procedure: ANTERIOR LUMBAR INTERBODY FUSION LUMBAR FIVE-SACRAL ONE.;  Surgeon: Earnie Larsson, MD;  Location: [facility];  Service: Neurosurgery;  Laterality: N/A;  anterior  . BACK SURGERY  1996  . CARDIAC CATHETERIZATION Left 04/27/2016   Procedure: Left Heart Cath and Coronary Angiography;  Surgeon: Corey Skains, MD;  Location: [place] CV LAB;  Service: Cardiovascular;  Laterality: Left;  . CARDIAC CATHETERIZATION N/A 04/27/2016   Procedure: Intravascular Pressure Wire/FFR Study;  Surgeon: Yolonda Kida, MD;  Location: [place] CV LAB;  Service: Cardiovascular;  Laterality: N/A;  . CATHETERIZATION OF PULMONARY ARTERY WITH RETRIEVAL OF FOREIGN BODY Bilateral 04/07/2011   heart  . DEGENERATIVE SPONDYLOLISTHESIS    . KNEE ARTHROSCOPY Bilateral 2004  . [place] SURGERY  1999  . RADICULOPATHY, CERVICAL REGION    . TONSILLECTOMY      There were no vitals filed for this visit.   Subjective Assessment - 03/31/21 1629    Subjective Patient reports today is a bad day. Having severe headaches. No falls since last session but very nausous today.    Pertinent History Pt has a complex medical history. 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 He states that he fell 10-12 feet and struck his head, neck, and back. He was able to finish the course which took him approximately 45 minutes more to complete. He does endorse a second fall while finishing the course but did not suffer a second head injury. He states that he started getting headaches that day which have persisted since that time. 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 He does report improvement in his headaches recently. Neurology was concerned about possible BPPV and have referred him for a vestibular evaluation. In addition  since the injury, pt developed RUE and RLE pain and weakness. Pt states that NCV showed abnormal nerve conduction in RUE. He has since undergone a C7-7 ACDF on 01/23/20. He reports initial improvement in RUE strength, but states that he has a gradual return of his RUE weakness. He was also having significant RLE weakness and pain and underwent and L5-S1 anterior lumbar interbody fusion on 03/25/20. Patient reports that he does not have any back precautions but needs to wear the low back brace for one more month. Patient reports he has a follow-up appointment with the surgeon next month. Patient reports he has a follow-up appointment with Dr. Brigitte Pulse, neurologist, in August. He arrives to therapy ambulating with an upright rollator walker. Pt denies any mention of concussion after his injury. He has been having cognitive issues since his injury and has previously had a heavy metals screen as well as neurocognitive testing. He has had an MRI of his brain with and without contrast on 12/18/19. Results were mildly motion degraded examination. No evidence of acute intracranial abnormality. Minimal chronic small vessel ischemic disease. He has tremor in both his hands. He has a positive family history of Parkinson's disease in his grandfather. He is complaining of constant dizziness and unsteadiness which are worse with activity. Patient states that he frequently loses his balance and his wife has to steady him.    Limitations Lifting;Standing;Walking;House hold activities    Diagnostic tests He has had an MRI of his brain with and without contrast on 12/18/19. Results were mildly motion degraded examination. No evidence of acute intracranial abnormality. Minimal chronic small vessel ischemic disease.    Patient Stated Goals to be able to ambulate with a cane or no AD, to be able to drive, to be able to ride his motorcycle    Currently in Pain? Yes    Pain Score 8     Pain Location Head    Pain Descriptors / Indicators  Throbbing    Pain Type Chronic pain    Pain Onset 1 to 4 weeks ago    Pain Frequency Constant                in room with lights off and noise damped due to migraine.    Seated:  3lb weight: -LAQ 15x each LE: very fatiguing -March 15x each LE -IR/ER 15x  -step over theraband and back 10x each LE; 2 sets -lateral step over theraband and back 10x each LE; 2 sets   6" step toe taps 10x each LE; 2 sets; alternating tapping with patient unable to look at step without severe nausea increase RTB paloff press 15x each side ; cues for hand placement and core activation  RTB abduction 20x RTB adduction 15x each LE ; challenging to perform last 5 repetitions  RTB marching 20x each LE   STM to bilateral upper traps, levator scap, and cervical paraspinals x 9 minutes, implementation of effleurage and ptrissage included.  patient reports decreased headache pain by end of session.      Pt educated throughout session about proper posture and technique with exercises. Improved exercise technique, movement at target joints, use of target muscles after min to mod verbal, visual, tactile cues   Patient's session limited due to dizziness and nausea with severe migraine. Patient reports slight decrease in headache after manual intervention for muscle tension reduction. Patient is very challenged by coordination this session, unable to look at feet without increasing nausea.  The patient continues to benefit from additional skilled PT services to improve LE strength and balance for decreased fall risk and improved quality of life                     PT Education - 03/31/21 1510    Education Details exercise technique, body mechanics    Person(s) Educated Patient    Methods Explanation;Tactile cues;Demonstration;Verbal cues    Comprehension Verbalized understanding;Returned demonstration;Verbal cues required;Tactile cues required            PT Short Term Goals - 03/06/21 1601       PT SHORT TERM GOAL #1   Title Patient will be independent in home exercise program to improve strength/mobility for better functional independence with ADLs and for self-management.    Baseline 3/3 HEP compliant 3/31 hep compliant    Time 6    Period Weeks    Status Achieved    Target Date 02/20/21      PT SHORT TERM GOAL #2   Title Patient will be modified independent in walking on even/uneven surface with least restrictive assistive device, for 10+ minutes without rest break, reporting some difficulty or less to improve walking tolerance with community ambulation including grocery shopping, going to church,etc.    Baseline 02/03: instability, LOB multiple times while ambulating to elevator 3/31: unable to test due to throwing up    Time 6    Period Weeks    Status On-going    Target Date 04/17/21      PT SHORT TERM GOAL #3   Title Patient will increase ABC scale score >80% to demonstrate better functional mobility and better confidence with ADLs.    Baseline 01/09/21: 60.625% 3/3/: 50% 3/31: 63%    Time 6    Period Weeks    Status Partially Met    Target Date 04/17/21             PT Long Term Goals - 03/06/21 1826      PT LONG TERM GOAL #1   Title Patient will increase FOTO score to equal to or greater than 65 to demonstrate statistically significant improvement in mobility and quality of life.    Baseline scored 41/100 on 05/21/2020; 7/22 40, 8/23: 60/100, 09/30/20=40, 01/09/21: 46 3/3: 40.3% 3/31: 49%    Time 12    Period Weeks    Status Partially Met    Target Date 05/29/21      PT LONG TERM GOAL #2   Title Patient will reduce falls risk as indicated by decreased TUG time to less than 11 seconds.    Baseline scored 37.9 sec with TUG on 05/21/2020; 21seconds with elevated rollator 7/22, 8/23: 13.62 sec with up and go walker, 01/09/21: 15.81s with hiking stick 3/3: 17.98 seconds one near LOB 3/31: 15.57 seconds with  walking stick    Time 12    Period Weeks     Status Partially Met    Target Date 05/29/21      PT LONG TERM GOAL #3   Title Patient will increase right UE and LE gross strength to 4+/5 throughout to improve functional strength for independent gait, increased standing tolerance and increased ADL ability.    Baseline grossly +3/5 to -4/5 right UE and LE strength on 05/21/2020; grossly 4-/5 for RUE, grossly 4-/5 for RLE on 7/22, grossly 4-/5 RUE, grossly 4/5 RLE 3/3: see note    Time 8    Period Weeks    Status Partially Met    Target Date 05/29/21      PT LONG TERM GOAL #4   Title Patient will increase 10 meter walk test to >1.64ms as to improve gait speed for better community ambulation and to reduce fall risk.    Baseline on 7/22 .57 m/sself selected, fast  .73 m/s with LOB pt reported R knee buckling, elevated rollator used, 8/23: 1.06 m/s with up and go walker, 01/09/21: 0.72 m/s with hiking stick 3/3: 0.74 m/s with walking stick 3/31: 0.81 m/s w walking stick    Time 12    Period Weeks    Status Partially Met    Target Date 05/29/21      PT LONG TERM GOAL #5   Title Patient will increase Berg Balance score by > 6 points to demonstrate decreased fall risk during functional activities.    Baseline 8/23: 37/56, 09/30/20=40/56, 12/20: 42/56 3/3: deferred 3/31 will perform next time; deferred due to throwing up    Time 8    Period Weeks    Status Deferred    Target Date 05/29/21      PT LONG TERM GOAL #6   Title Patient (< 659years old) will complete five times sit to stand test in < 10 seconds indicating an increased LE strength and improved balance.    Baseline 8/23: 22 sec, 09/30/20=13.04 sec, 12/20: 27.05 sec, 01/09/21: 18.01s 3/3: 23 seconds no UE support 3/31; deferred due to patient throwing up    Time 12    Period Weeks    Status Partially Met    Target Date 05/29/21      PT LONG TERM GOAL #7   Title Patient will improve 6 min walk test >1000 feet with LRAD for improved gait ability in community.    Baseline 8/23: not  tested; 8/30 845, goal adjusted >20098f 09/30/20=600 feet - stopped test due to R ankle catching making gait unsafe, 11/25/20= 330 feet - stopped test due to severe dizziness, vomiting/nausea, sweating making gait unsafe; 01/09/21: 720 ft with no rest breaks 3/3: deferred due to nausea 3/31: deferred due to patient throwing up    Time 12    Period Weeks    Status Deferred    Target Date 05/29/21                 Plan - 03/31/21 1632    Clinical Impression Statement Patient's session limited due to dizziness and nausea with severe migraine. Patient reports slight decrease in headache after manual intervention for muscle tension reduction. Patient is very challenged by coordination this session, unable to look at feet without increasing nausea.  The patient continues to benefit from additional skilled PT services to improve LE strength and balance for decreased fall risk and improved quality of life    Personal Factors and Comorbidities Comorbidity 3+;Time since  onset of injury/illness/exacerbation    Comorbidities anxiety, back pain, COPD, headache, HTN, MI, sleep apnea, migraines    Examination-Activity Limitations Locomotion Level;Transfers;Bed Mobility;Stand;Stairs;Sleep;Squat;Bend    Examination-Participation Restrictions Driving;Medication Management    Stability/Clinical Decision Making Unstable/Unpredictable    Rehab Potential Fair    PT Frequency 2x / week    PT Duration 12 weeks    PT Treatment/Interventions ADLs/Self Care Home Management;Canalith Repostioning;Moist Heat;Electrical Stimulation;Gait training;Stair training;Functional mobility training;Therapeutic activities;Therapeutic exercise;Balance training;Neuromuscular re-education;Patient/family education;Manual techniques;Passive range of motion;Vestibular    PT Next Visit Plan update HEP ea session for continued strengthening/balanceing at home    PT [place] Access Code: [email or web address] updated this session     Consulted and Agree with Plan of Care Patient;Other (Comment)   Friend          Patient will benefit from skilled therapeutic intervention in order to improve the following deficits and impairments:  Decreased activity tolerance,Decreased balance,Decreased cognition,Decreased mobility,Difficulty walking,Abnormal gait,Decreased range of motion,Dizziness,Pain,Impaired flexibility,Decreased strength,Impaired sensation,Decreased endurance,Decreased safety awareness  Visit Diagnosis: Muscle weakness (generalized)  Unsteadiness on feet  Dizziness and giddiness     Problem List Patient Active Problem List   Diagnosis Date Noted  . Degenerative spondylolisthesis 03/25/2020  . Physical deconditioning 03/04/2020  . Lobar pneumonia, unspecified organism ([place]) 03/04/2020  . Intractable hemiplegic migraine with status migrainosus 03/02/2020  . Current tobacco use 02/05/2020  . Abnormal findings on diagnostic imaging of lung 02/05/2020  . Shortness of breath 02/05/2020  . Herniated disc, cervical 01/23/2020  . Cervical spondylosis with myelopathy and radiculopathy 01/23/2020  . Pre-operative respiratory examination 01/12/2020  . Weakness on right side of face 12/22/2019  . Osteoarthritis of spine with radiculopathy, lumbar region 12/11/2019  . Intractable migraine with aura with status migrainosus 11/09/2019  . Former smoker 08/27/2018  . Benign essential HTN 08/27/2018  . Cough productive of purulent sputum 11/08/2017  . GERD (gastroesophageal reflux disease) 05/06/2017  . Contusion of hand 04/22/2017  . BP (high blood pressure) 04/22/2017  . Oxygen desaturation 04/22/2017  . Prostate lump 04/22/2017  . Unstable angina ([place]) 04/27/2016  . Chest pain 04/24/2016  . Morbid obesity ([place]) 01/29/2016  . Elevated ALT measurement 10/30/2015  . Nonspecific elevation of levels of transaminase and lactic acid dehydrogenase (ldh) 10/30/2015  . Reduced libido 10/29/2015  . Hyperlipidemia  08/06/2015  . Anxiety 08/06/2015  . Atherosclerosis of coronary artery 08/06/2015  . Childhood asthma 08/06/2015  . Chronic obstructive pulmonary disease ([place]) 08/06/2015  . OSA (obstructive sleep apnea) 08/06/2015  . Anxiety disorder 08/06/2015  . Atherosclerosis of native coronary artery of native heart with stable angina pectoris ([place]) 08/06/2015  . Uncomplicated asthma 07/68/0881   Janna Arch, PT, DPT   03/31/2021, 4:33 PM  [place] MAIN [facility] SERVICES [address] [place], [place], [address] Phone: [phone]   Fax:  [phone]  Name: Cory Espinoza MRN: [id number] Date of Birth: May 08, 1971

## 2021-04-03 ENCOUNTER — Ambulatory Visit: Payer: BC Managed Care – PPO

## 2021-04-03 ENCOUNTER — Other Ambulatory Visit: Payer: Self-pay

## 2021-04-03 DIAGNOSIS — M6281 Muscle weakness (generalized): Secondary | ICD-10-CM

## 2021-04-03 DIAGNOSIS — R42 Dizziness and giddiness: Secondary | ICD-10-CM

## 2021-04-03 DIAGNOSIS — R2681 Unsteadiness on feet: Secondary | ICD-10-CM

## 2021-04-03 NOTE — Therapy (Signed)
[place] MAIN [facility] SERVICES [address] [place], [place], [address] Phone: [phone]   Fax:  [phone]  Physical Therapy Treatment  Patient Details  Name: Cory Espinoza MRN: [id number] Date of Birth: Aug 27, 1971 Referring Provider (PT): Dr. Joselyn Arrow   Encounter Date: 04/03/2021   PT End of Session - 04/03/21 1724    Visit Number 39    Number of Visits 10    Date for PT Re-Evaluation 05/29/21    Authorization Type [employer] PPO    Authorization Time Period 01/09/21-04/03/21    PT Start Time 1515    PT Stop Time 1600    PT Time Calculation (min) 45 min    Equipment Utilized During Treatment Gait belt    Activity Tolerance Patient tolerated treatment well;No increased pain    Behavior During Therapy WFL for tasks assessed/performed           Past Medical History:  Diagnosis Date  . Allergy   . Anginal pain ([place])   . Anxiety   . Arthritis    lumbar spine  . Asthma   . Atrial fibrillation ([place])   . Back pain    Severe Lumbar Pain  . CHF (congestive heart failure) ([place])   . COPD (chronic obstructive pulmonary disease) ([facility])    Restrictive lung disease  . Coronary artery disease   . Dyspnea   . Dysrhythmia    afib  . GERD (gastroesophageal reflux disease)   . Headache    [name], onset October 2020, cluster headaches in the past  . History of kidney stones   . Hyperlipidemia   . Hypertension   . Myocardial infarction ([place])    at age 36  . Pneumonia   . Restrictive lung disease   . Sleep apnea    unable to use cpap since onset of migraines    Past Surgical History:  Procedure Laterality Date  . ABDOMINAL EXPOSURE N/A 03/25/2020   Procedure: ABDOMINAL EXPOSURE;  Surgeon: Rosetta Posner, MD;  Location: [facility] OR;  Service: Vascular;  Laterality: N/A;  . ANTERIOR CERVICAL DECOMP/DISCECTOMY FUSION N/A 01/23/2020   Procedure: ANTERIOR CERVICAL DECOMPRESSION FUSION - CERVICAL SIX-CERVICAL SEVEN;  Surgeon: Earnie Larsson, MD;   Location: [place];  Service: Neurosurgery;  Laterality: N/A;  . ANTERIOR LUMBAR FUSION N/A 03/25/2020   Procedure: ANTERIOR LUMBAR INTERBODY FUSION LUMBAR FIVE-SACRAL ONE.;  Surgeon: Earnie Larsson, MD;  Location: [place];  Service: Neurosurgery;  Laterality: N/A;  anterior  . BACK SURGERY  1996  . CARDIAC CATHETERIZATION Left 04/27/2016   Procedure: Left Heart Cath and Coronary Angiography;  Surgeon: Corey Skains, MD;  Location: [place] CV LAB;  Service: Cardiovascular;  Laterality: Left;  . CARDIAC CATHETERIZATION N/A 04/27/2016   Procedure: Intravascular Pressure Wire/FFR Study;  Surgeon: Yolonda Kida, MD;  Location: [place] CV LAB;  Service: Cardiovascular;  Laterality: N/A;  . CATHETERIZATION OF PULMONARY ARTERY WITH RETRIEVAL OF FOREIGN BODY Bilateral 04/07/2011   heart  . DEGENERATIVE SPONDYLOLISTHESIS    . KNEE ARTHROSCOPY Bilateral 2004  . [place] SURGERY  1999  . RADICULOPATHY, CERVICAL REGION    . TONSILLECTOMY      There were no vitals filed for this visit.   Subjective Assessment - 04/03/21 1722    Subjective Patient reports today is a good day. Was able to make lunch for his grandkids yesterday and empty the dishwasher. Has his medication changed with an increase in his antisiezure medication.    Pertinent  History Pt has a complex medical history. 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 He states that he fell 10-12 feet and struck his head, neck, and back. He was able to finish the course which took him approximately 45 minutes more to complete. He does endorse a second fall while finishing the course but did not suffer a second head injury. He states that he started getting headaches that day which have persisted since that time. 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 He does report improvement in his headaches recently. Neurology was concerned about  possible BPPV and have referred him for a vestibular evaluation. In addition since the injury, pt developed RUE and RLE pain and weakness. Pt states that NCV showed abnormal nerve conduction in RUE. He has since undergone a C7-7 ACDF on 01/23/20. He reports initial improvement in RUE strength, but states that he has a gradual return of his RUE weakness. He was also having significant RLE weakness and pain and underwent and L5-S1 anterior lumbar interbody fusion on 03/25/20. Patient reports that he does not have any back precautions but needs to wear the low back brace for one more month. Patient reports he has a follow-up appointment with the surgeon next month. Patient reports he has a follow-up appointment with Dr. Brigitte Pulse, neurologist, in August. He arrives to therapy ambulating with an upright rollator walker. Pt denies any mention of concussion after his injury. He has been having cognitive issues since his injury and has previously had a heavy metals screen as well as neurocognitive testing. He has had an MRI of his brain with and without contrast on 12/18/19. Results were mildly motion degraded examination. No evidence of acute intracranial abnormality. Minimal chronic small vessel ischemic disease. He has tremor in both his hands. He has a positive family history of Parkinson's disease in his grandfather. He is complaining of constant dizziness and unsteadiness which are worse with activity. Patient states that he frequently loses his balance and his wife has to steady him.    Limitations Lifting;Standing;Walking;House hold activities    Diagnostic tests He has had an MRI of his brain with and without contrast on 12/18/19. Results were mildly motion degraded examination. No evidence of acute intracranial abnormality. Minimal chronic small vessel ischemic disease.    Patient Stated Goals to be able to ambulate with a cane or no AD, to be able to drive, to be able to ride his motorcycle    Currently in Pain?  No/denies                 in // bars:  airex pad: 6" step modified tandem stance reach into bag and throw at target 15x each foot placement.  airex pad: static stand reach into bag and throw at target 15 balls 6" step ups 10x each LE; painful to RLE with hyperextension resulting in termination of intervention   Forward step over and back orange hurdle 10x each LE   hedgehog taps 3 way hip 10x; more challenging for RLE coordination. Progressed to SUE support  Hedgehog foot taps progress to SUE support 8x each LE GTB TKE RLE 10x    Seated:  GTB row 15x cues for body mechanics (cut BTB for home HEP)  GTB around bilateral ankles: alternating LAQ 10x each LE  GTB abduction 20x GTB around ankles: alternating ER/IR 10x each LE  Pt educated throughout session about proper posture and technique with exercises. Improved exercise technique, movement at target joints, use of target muscles after min to mod verbal, visual, tactile cues   Patient is having a "good day" today allowing for progression of balance and stabilization interventions. Occasional L knee hyperextension occurred with weighted bearing requiring stabilization and targeted strengthening. Patient is highly motivated and eager to progress to return to PLOF. His ankle righting reactions are improving with decreased episodes of LOB this session. The patient continues to benefit from additional skilled PT services to improve LE strength and balance for decreased fall risk and improved quality of life                      PT Education - 04/03/21 1723    Education Details exercise technique, body mechanics    Person(s) Educated Patient    Methods Explanation;Demonstration;Tactile cues;Verbal cues    Comprehension Verbalized understanding;Returned demonstration;Verbal cues required;Tactile cues required            PT Short Term Goals - 03/06/21 1601      PT SHORT TERM GOAL #1   Title Patient will be  independent in home exercise program to improve strength/mobility for better functional independence with ADLs and for self-management.    Baseline 3/3 HEP compliant 3/31 hep compliant    Time 6    Period Weeks    Status Achieved    Target Date 02/20/21      PT SHORT TERM GOAL #2   Title Patient will be modified independent in walking on even/uneven surface with least restrictive assistive device, for 10+ minutes without rest break, reporting some difficulty or less to improve walking tolerance with community ambulation including grocery shopping, going to church,etc.    Baseline 02/03: instability, LOB multiple times while ambulating to elevator 3/31: unable to test due to throwing up    Time 6    Period Weeks    Status On-going    Target Date 04/17/21      PT SHORT TERM GOAL #3   Title Patient will increase ABC scale score >80% to demonstrate better functional mobility and better confidence with ADLs.    Baseline 01/09/21: 60.625% 3/3/: 50% 3/31: 63%    Time 6    Period Weeks    Status Partially Met    Target Date 04/17/21             PT Long Term Goals - 03/06/21 1826      PT LONG TERM GOAL #1   Title Patient will increase FOTO score to equal to or greater than 65 to demonstrate statistically significant improvement in mobility and quality of life.    Baseline scored 41/100 on 05/21/2020; 7/22 40, 8/23: 60/100, 09/30/20=40, 01/09/21: 46 3/3: 40.3% 3/31: 49%    Time 12    Period Weeks    Status Partially Met    Target Date 05/29/21      PT LONG TERM GOAL #2   Title Patient will reduce falls risk as indicated by decreased TUG time to less than 11 seconds.    Baseline scored 37.9 sec with TUG on 05/21/2020; 21seconds with elevated rollator 7/22, 8/23: 13.62 sec with up and go walker, 01/09/21: 15.81s with hiking stick 3/3: 17.98 seconds one near LOB 3/31: 15.57 seconds with walking stick    Time 12    Period Weeks    Status Partially Met    Target Date 05/29/21  PT  LONG TERM GOAL #3   Title Patient will increase right UE and LE gross strength to 4+/5 throughout to improve functional strength for independent gait, increased standing tolerance and increased ADL ability.    Baseline grossly +3/5 to -4/5 right UE and LE strength on 05/21/2020; grossly 4-/5 for RUE, grossly 4-/5 for RLE on 7/22, grossly 4-/5 RUE, grossly 4/5 RLE 3/3: see note    Time 8    Period Weeks    Status Partially Met    Target Date 05/29/21      PT LONG TERM GOAL #4   Title Patient will increase 10 meter walk test to >1.63ms as to improve gait speed for better community ambulation and to reduce fall risk.    Baseline on 7/22 .57 m/sself selected, fast  .73 m/s with LOB pt reported R knee buckling, elevated rollator used, 8/23: 1.06 m/s with up and go walker, 01/09/21: 0.72 m/s with hiking stick 3/3: 0.74 m/s with walking stick 3/31: 0.81 m/s w walking stick    Time 12    Period Weeks    Status Partially Met    Target Date 05/29/21      PT LONG TERM GOAL #5   Title Patient will increase Berg Balance score by > 6 points to demonstrate decreased fall risk during functional activities.    Baseline 8/23: 37/56, 09/30/20=40/56, 12/20: 42/56 3/3: deferred 3/31 will perform next time; deferred due to throwing up    Time 8    Period Weeks    Status Deferred    Target Date 05/29/21      PT LONG TERM GOAL #6   Title Patient (< 638years old) will complete five times sit to stand test in < 10 seconds indicating an increased LE strength and improved balance.    Baseline 8/23: 22 sec, 09/30/20=13.04 sec, 12/20: 27.05 sec, 01/09/21: 18.01s 3/3: 23 seconds no UE support 3/31; deferred due to patient throwing up    Time 12    Period Weeks    Status Partially Met    Target Date 05/29/21      PT LONG TERM GOAL #7   Title Patient will improve 6 min walk test >1000 feet with LRAD for improved gait ability in community.    Baseline 8/23: not tested; 8/30 845, goal adjusted >20061f 09/30/20=600  feet - stopped test due to R ankle catching making gait unsafe, 11/25/20= 330 feet - stopped test due to severe dizziness, vomiting/nausea, sweating making gait unsafe; 01/09/21: 720 ft with no rest breaks 3/3: deferred due to nausea 3/31: deferred due to patient throwing up    Time 12    Period Weeks    Status Deferred    Target Date 05/29/21                 Plan - 04/03/21 1725    Clinical Impression Statement Patient is having a "good day" today allowing for progression of balance and stabilization interventions. Occasional L knee hyperextension occurred with weighted bearing requiring stabilization and targeted strengthening. Patient is highly motivated and eager to progress to return to PLOF. His ankle righting reactions are improving with decreased episodes of LOB this session. The patient continues to benefit from additional skilled PT services to improve LE strength and balance for decreased fall risk and improved quality of life    Personal Factors and Comorbidities Comorbidity 3+;Time since onset of injury/illness/exacerbation    Comorbidities anxiety, back pain, COPD, headache, HTN, MI, sleep apnea, migraines  Examination-Activity Limitations Locomotion Level;Transfers;Bed Mobility;Stand;Stairs;Sleep;Squat;Bend    Examination-Participation Restrictions Driving;Medication Management    Stability/Clinical Decision Making Unstable/Unpredictable    Rehab Potential Fair    PT Frequency 2x / week    PT Duration 12 weeks    PT Treatment/Interventions ADLs/Self Care Home Management;Canalith Repostioning;Moist Heat;Electrical Stimulation;Gait training;Stair training;Functional mobility training;Therapeutic activities;Therapeutic exercise;Balance training;Neuromuscular re-education;Patient/family education;Manual techniques;Passive range of motion;Vestibular    PT Next Visit Plan update HEP ea session for continued strengthening/balanceing at home    PT [place]  Access Code: [email or web address] updated this session    Consulted and Agree with Plan of Care Patient;Other (Comment)   Friend          Patient will benefit from skilled therapeutic intervention in order to improve the following deficits and impairments:  Decreased activity tolerance,Decreased balance,Decreased cognition,Decreased mobility,Difficulty walking,Abnormal gait,Decreased range of motion,Dizziness,Pain,Impaired flexibility,Decreased strength,Impaired sensation,Decreased endurance,Decreased safety awareness  Visit Diagnosis: Muscle weakness (generalized)  Unsteadiness on feet  Dizziness and giddiness     Problem List Patient Active Problem List   Diagnosis Date Noted  . Degenerative spondylolisthesis 03/25/2020  . Physical deconditioning 03/04/2020  . Lobar pneumonia, unspecified organism ([place]) 03/04/2020  . Intractable hemiplegic migraine with status migrainosus 03/02/2020  . Current tobacco use 02/05/2020  . Abnormal findings on diagnostic imaging of lung 02/05/2020  . Shortness of breath 02/05/2020  . Herniated disc, cervical 01/23/2020  . Cervical spondylosis with myelopathy and radiculopathy 01/23/2020  . Pre-operative respiratory examination 01/12/2020  . Weakness on right side of face 12/22/2019  . Osteoarthritis of spine with radiculopathy, lumbar region 12/11/2019  . Intractable migraine with aura with status migrainosus 11/09/2019  . Former smoker 08/27/2018  . Benign essential HTN 08/27/2018  . Cough productive of purulent sputum 11/08/2017  . GERD (gastroesophageal reflux disease) 05/06/2017  . Contusion of hand 04/22/2017  . BP (high blood pressure) 04/22/2017  . Oxygen desaturation 04/22/2017  . Prostate lump 04/22/2017  . Unstable angina ([place]) 04/27/2016  . Chest pain 04/24/2016  . Morbid obesity ([place]) 01/29/2016  . Elevated ALT measurement 10/30/2015  . Nonspecific elevation of levels of transaminase and lactic acid dehydrogenase (ldh) 10/30/2015  .  Reduced libido 10/29/2015  . Hyperlipidemia 08/06/2015  . Anxiety 08/06/2015  . Atherosclerosis of coronary artery 08/06/2015  . Childhood asthma 08/06/2015  . Chronic obstructive pulmonary disease ([place]) 08/06/2015  . OSA (obstructive sleep apnea) 08/06/2015  . Anxiety disorder 08/06/2015  . Atherosclerosis of native coronary artery of native heart with stable angina pectoris ([place]) 08/06/2015  . Uncomplicated asthma 63/84/5364   Janna Arch, PT, DPT   04/03/2021, 5:25 PM  [place] MAIN [facility] SERVICES [address] [place], [place], [address] Phone: [phone]   Fax:  [phone]  Name: Cory Espinoza MRN: [id number] Date of Birth: [birth year]

## 2021-04-07 ENCOUNTER — Ambulatory Visit: Payer: BC Managed Care – PPO

## 2021-04-10 ENCOUNTER — Other Ambulatory Visit: Payer: Self-pay

## 2021-04-10 ENCOUNTER — Ambulatory Visit: Payer: BC Managed Care – PPO | Attending: Neurology

## 2021-04-10 DIAGNOSIS — R42 Dizziness and giddiness: Secondary | ICD-10-CM | POA: Diagnosis present

## 2021-04-10 DIAGNOSIS — R2681 Unsteadiness on feet: Secondary | ICD-10-CM | POA: Diagnosis present

## 2021-04-10 DIAGNOSIS — M6281 Muscle weakness (generalized): Secondary | ICD-10-CM | POA: Diagnosis not present

## 2021-04-10 NOTE — Therapy (Signed)
[place] [facility] SERVICES [address] [place], [place], [address] Phone: [phone]   Fax:  [phone]  Physical Therapy Treatment Physical Therapy Progress Note   Dates of reporting period  02/06/21   to   04/10/21  Patient Details  Name: Cory Espinoza MRN: [id number] Date of Birth: [birth year] Referring Provider (PT): Dr. Joselyn Arrow   Encounter Date: 04/10/2021   PT End of Session - 04/10/21 1621    Visit Number 40    Number of Visits 91    Date for PT Re-Evaluation 05/29/21    Authorization Type [employer] PPO; next session 1/10 PN 04/10/21    Authorization Time Period 01/09/21-04/03/21    PT Start Time 1515    PT Stop Time 1601    PT Time Calculation (min) 46 min    Equipment Utilized During Treatment Gait belt    Activity Tolerance Patient tolerated treatment well;No increased pain    Behavior During Therapy WFL for tasks assessed/performed           Past Medical History:  Diagnosis Date  . Allergy   . Anginal pain ([place])   . Anxiety   . Arthritis    lumbar spine  . Asthma   . Atrial fibrillation ([place])   . Back pain    Severe Lumbar Pain  . CHF (congestive heart failure) ([place])   . COPD (chronic obstructive pulmonary disease) ([facility])    Restrictive lung disease  . Coronary artery disease   . Dyspnea   . Dysrhythmia    afib  . GERD (gastroesophageal reflux disease)   . Headache    [name], onset October 2020, cluster headaches in the past  . History of kidney stones   . Hyperlipidemia   . Hypertension   . Myocardial infarction ([place])    at age 57  . Pneumonia   . Restrictive lung disease   . Sleep apnea    unable to use cpap since onset of migraines    Past Surgical History:  Procedure Laterality Date  . ABDOMINAL EXPOSURE N/A 03/25/2020   Procedure: ABDOMINAL EXPOSURE;  Surgeon: Rosetta Posner, MD;  Location: [facility] OR;  Service: Vascular;  Laterality: N/A;  . ANTERIOR CERVICAL DECOMP/DISCECTOMY FUSION N/A 01/23/2020    Procedure: ANTERIOR CERVICAL DECOMPRESSION FUSION - CERVICAL SIX-CERVICAL SEVEN;  Surgeon: Earnie Larsson, MD;  Location: [facility];  Service: Neurosurgery;  Laterality: N/A;  . ANTERIOR LUMBAR FUSION N/A 03/25/2020   Procedure: ANTERIOR LUMBAR INTERBODY FUSION LUMBAR FIVE-SACRAL ONE.;  Surgeon: Earnie Larsson, MD;  Location: [facility];  Service: Neurosurgery;  Laterality: N/A;  anterior  . BACK SURGERY  1996  . CARDIAC CATHETERIZATION Left 04/27/2016   Procedure: Left Heart Cath and Coronary Angiography;  Surgeon: Corey Skains, MD;  Location: [facility];  Service: Cardiovascular;  Laterality: Left;  . CARDIAC CATHETERIZATION N/A 04/27/2016   Procedure: Intravascular Pressure Wire/FFR Study;  Surgeon: Yolonda Kida, MD;  Location: [facility] LAB;  Service: Cardiovascular;  Laterality: N/A;  . CATHETERIZATION OF PULMONARY ARTERY WITH RETRIEVAL OF FOREIGN BODY Bilateral 04/07/2011   heart  . DEGENERATIVE SPONDYLOLISTHESIS    . KNEE ARTHROSCOPY Bilateral 2004  . [place] SURGERY  1999  . RADICULOPATHY, CERVICAL REGION    . TONSILLECTOMY      There were no vitals filed for this visit.   Subjective Assessment - 04/10/21 1616    Subjective Patient reports he has been bed bound for past four days due  to weather. Is feeling tired today after helping his daughter house hunt.    Pertinent History Pt has a complex medical history. 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 He states that he fell 10-12 feet and struck his head, neck, and back. He was able to finish the course which took him approximately 45 minutes more to complete. He does endorse a second fall while finishing the course but did not suffer a second head injury. He states that he started getting headaches that day which have persisted since that time. 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 He does report improvement  in his headaches recently. Neurology was concerned about possible BPPV and have referred him for a vestibular evaluation. In addition since the injury, pt developed RUE and RLE pain and weakness. Pt states that NCV showed abnormal nerve conduction in RUE. He has since undergone a C7-7 ACDF on 01/23/20. He reports initial improvement in RUE strength, but states that he has a gradual return of his RUE weakness. He was also having significant RLE weakness and pain and underwent and L5-S1 anterior lumbar interbody fusion on 03/25/20. Patient reports that he does not have any back precautions but needs to wear the low back brace for one more month. Patient reports he has a follow-up appointment with the surgeon next month. Patient reports he has a follow-up appointment with Dr. Brigitte Pulse, neurologist, in August. He arrives to therapy ambulating with an upright rollator walker. Pt denies any mention of concussion after his injury. He has been having cognitive issues since his injury and has previously had a heavy metals screen as well as neurocognitive testing. He has had an MRI of his brain with and without contrast on 12/18/19. Results were mildly motion degraded examination. No evidence of acute intracranial abnormality. Minimal chronic small vessel ischemic disease. He has tremor in both his hands. He has a positive family history of Parkinson's disease in his grandfather. He is complaining of constant dizziness and unsteadiness which are worse with activity. Patient states that he frequently loses his balance and his wife has to steady him.    Limitations Lifting;Standing;Walking;House hold activities    Diagnostic tests He has had an MRI of his brain with and without contrast on 12/18/19. Results were mildly motion degraded examination. No evidence of acute intracranial abnormality. Minimal chronic small vessel ischemic disease.    Patient Stated Goals to be able to ambulate with a cane or no AD, to be able to drive, to  be able to ride his motorcycle    Currently in Pain? Yes    Pain Score 8     Pain Location Head    Pain Orientation Mid    Pain Descriptors / Indicators Aching    Pain Type Chronic pain    Pain Onset More than a month ago    Pain Frequency Intermittent              [facility] PT Assessment - 04/10/21 0001      Standardized Balance Assessment   Standardized Balance Assessment Berg Balance Test             Progress notes Goals: -TERMINATED DUE TO THROWING UP  BERG-terminated due to emesis and dizziness  5x STS: 18.8 seconds     in room with lights off and noise damped due to migraine.   Seated:  4lb  weight: -LAQ 15x each WK:MQKM fatiguing -March 15x each LE to PT hand  -IR/ER 15x over line for coordination  - forward step over theraband and back 15x each LE;    Toe tap on line 30 seconds for coordination, sequencing, timing and muscle recruitment  RTB abduction 20x one LE at a time RTB adduction 15x each LE ; challenging to perform last 5 repetitions  RTB around bilateral ankles: alternating LAQ 15x each LE BTB hamstring curls 10x each LE  Patient's condition has the potential to improve in response to therapy. Maximum improvement is yet to be obtained. The anticipated improvement is attainable and reasonable in a generally predictable time.  Patient reports he is not where he wants to be in life. Is still very dependent and loses balance too frequently.    Pt educated throughout session about proper posture and technique with exercises. Improved exercise technique, movement at target joints, use of target muscles after min to mod verbal, visual, tactile cues  Attempted to test patient's goals however he immediately began having episodic emesis and was unable to continue. Will perform goals next session. Seated interventions in a room with noise off and lights off was tolerated. L knee pain is not present this session and patient tolerated increased weight with  increased repetition.  Patient's condition has the potential to improve in response to therapy. Maximum improvement is yet to be obtained. The anticipated improvement is attainable and reasonable in a generally predictable time.                  PT Education - 04/10/21 1618    Education Details exercise technique, body mechanics    Person(s) Educated Patient    Methods Explanation;Demonstration;Tactile cues;Verbal cues    Comprehension Verbalized understanding;Returned demonstration;Verbal cues required;Tactile cues required            PT Short Term Goals - 03/06/21 1601      PT SHORT TERM GOAL #1   Title Patient will be independent in home exercise program to improve strength/mobility for better functional independence with ADLs and for self-management.    Baseline 3/3 HEP compliant 3/31 hep compliant    Time 6    Period Weeks    Status Achieved    Target Date 02/20/21      PT SHORT TERM GOAL #2   Title Patient will be modified independent in walking on even/uneven surface with least restrictive assistive device, for 10+ minutes without rest break, reporting some difficulty or less to improve walking tolerance with community ambulation including grocery shopping, going to church,etc.    Baseline 02/03: instability, LOB multiple times while ambulating to elevator 3/31: unable to test due to throwing up    Time 6    Period Weeks    Status On-going    Target Date 04/17/21      PT SHORT TERM GOAL #3   Title Patient will increase ABC scale score >80% to demonstrate better functional mobility and better confidence with ADLs.    Baseline 01/09/21: 60.625% 3/3/: 50% 3/31: 63%    Time 6    Period Weeks    Status Partially Met    Target Date 04/17/21             PT Long Term Goals - 03/06/21 1826      PT LONG TERM GOAL #1   Title Patient will increase FOTO score to equal to or greater than 65 to demonstrate statistically significant improvement in mobility and  quality of life.    Baseline scored 41/100 on 05/21/2020; 7/22 40, 8/23: 60/100, 09/30/20=40, 01/09/21: 46 3/3: 40.3% 3/31: 49%    Time 12    Period Weeks    Status Partially Met    Target Date 05/29/21      PT LONG TERM GOAL #2   Title Patient will reduce falls risk as indicated by decreased TUG time to less than 11 seconds.    Baseline scored 37.9 sec with TUG on 05/21/2020; 21seconds with elevated rollator 7/22, 8/23: 13.62 sec with up and go walker, 01/09/21: 15.81s with hiking stick 3/3: 17.98 seconds one near LOB 3/31: 15.57 seconds with walking stick    Time 12    Period Weeks    Status Partially Met    Target Date 05/29/21      PT LONG TERM GOAL #3   Title Patient will increase right UE and LE gross strength to 4+/5 throughout to improve functional strength for independent gait, increased standing tolerance and increased ADL ability.    Baseline grossly +3/5 to -4/5 right UE and LE strength on 05/21/2020; grossly 4-/5 for RUE, grossly 4-/5 for RLE on 7/22, grossly 4-/5 RUE, grossly 4/5 RLE 3/3: see note    Time 8    Period Weeks    Status Partially Met    Target Date 05/29/21      PT LONG TERM GOAL #4   Title Patient will increase 10 meter walk test to >1.59ms as to improve gait speed for better community ambulation and to reduce fall risk.    Baseline on 7/22 .57 m/sself selected, fast  .73 m/s with LOB pt reported R knee buckling, elevated rollator used, 8/23: 1.06 m/s with up and go walker, 01/09/21: 0.72 m/s with hiking stick 3/3: 0.74 m/s with walking stick 3/31: 0.81 m/s w walking stick    Time 12    Period Weeks    Status Partially Met    Target Date 05/29/21      PT LONG TERM GOAL #5   Title Patient will increase Berg Balance score by > 6 points to demonstrate decreased fall risk during functional activities.    Baseline 8/23: 37/56, 09/30/20=40/56, 12/20: 42/56 3/3: deferred 3/31 will perform next time; deferred due to throwing up    Time 8    Period Weeks    Status  Deferred    Target Date 05/29/21      PT LONG TERM GOAL #6   Title Patient (< 681years old) will complete five times sit to stand test in < 10 seconds indicating an increased LE strength and improved balance.    Baseline 8/23: 22 sec, 09/30/20=13.04 sec, 12/20: 27.05 sec, 01/09/21: 18.01s 3/3: 23 seconds no UE support 3/31; deferred due to patient throwing up    Time 12    Period Weeks    Status Partially Met    Target Date 05/29/21      PT LONG TERM GOAL #7   Title Patient will improve 6 min walk test >1000 feet with LRAD for improved gait ability in community.    Baseline 8/23: not tested; 8/30 845, goal adjusted >20060f 09/30/20=600 feet - stopped test due to R ankle catching making gait unsafe, 11/25/20= 330 feet - stopped test due to severe dizziness, vomiting/nausea, sweating making gait unsafe; 01/09/21: 720 ft with no rest breaks 3/3: deferred due to nausea 3/31: deferred due to patient throwing up    Time 12    Period Weeks  Status Deferred    Target Date 05/29/21                 Plan - 04/10/21 1623    Clinical Impression Statement Attempted to test patient's goals however he immediately began having episodic emesis and was unable to continue. Will perform goals next session. Seated interventions in a room with noise off and lights off was tolerated. L knee pain is not present this session and patient tolerated increased weight with increased repetition.  Patient's condition has the potential to improve in response to therapy. Maximum improvement is yet to be obtained. The anticipated improvement is attainable and reasonable in a generally predictable time.    Personal Factors and Comorbidities Comorbidity 3+;Time since onset of injury/illness/exacerbation    Comorbidities anxiety, back pain, COPD, headache, HTN, MI, sleep apnea, migraines    Examination-Activity Limitations Locomotion Level;Transfers;Bed Mobility;Stand;Stairs;Sleep;Squat;Bend     Examination-Participation Restrictions Driving;Medication Management    Stability/Clinical Decision Making Unstable/Unpredictable    Rehab Potential Fair    PT Frequency 2x / week    PT Duration 12 weeks    PT Treatment/Interventions ADLs/Self Care Home Management;[place] Repostioning;Moist Heat;Electrical Stimulation;Gait training;Stair training;Functional mobility training;Therapeutic activities;Therapeutic exercise;Balance training;Neuromuscular re-education;Patient/family education;Manual techniques;Passive range of motion;Vestibular    PT Next Visit Plan update HEP ea session for continued strengthening/balanceing at home    PT [place] Access Code: [email or web address] updated this session    Consulted and Agree with Plan of Care Patient;Other (Comment)   Friend          Patient will benefit from skilled therapeutic intervention in order to improve the following deficits and impairments:  Decreased activity tolerance,Decreased balance,Decreased cognition,Decreased mobility,Difficulty walking,Abnormal gait,Decreased range of motion,Dizziness,Pain,Impaired flexibility,Decreased strength,Impaired sensation,Decreased endurance,Decreased safety awareness  Visit Diagnosis: Muscle weakness (generalized)  Unsteadiness on feet  Dizziness and giddiness     Problem List Patient Active Problem List   Diagnosis Date Noted  . Degenerative spondylolisthesis 03/25/2020  . Physical deconditioning 03/04/2020  . Lobar pneumonia, unspecified organism ([place]) 03/04/2020  . Intractable hemiplegic migraine with status migrainosus 03/02/2020  . Current tobacco use 02/05/2020  . Abnormal findings on diagnostic imaging of lung 02/05/2020  . Shortness of breath 02/05/2020  . Herniated disc, cervical 01/23/2020  . Cervical spondylosis with myelopathy and radiculopathy 01/23/2020  . Pre-operative respiratory examination 01/12/2020  . Weakness on right side of face 12/22/2019  . Osteoarthritis  of spine with radiculopathy, lumbar region 12/11/2019  . Intractable migraine with aura with status migrainosus 11/09/2019  . Former smoker 08/27/2018  . Benign essential HTN 08/27/2018  . Cough productive of purulent sputum 11/08/2017  . GERD (gastroesophageal reflux disease) 05/06/2017  . Contusion of hand 04/22/2017  . BP (high blood pressure) 04/22/2017  . Oxygen desaturation 04/22/2017  . Prostate lump 04/22/2017  . Unstable angina ([place]) 04/27/2016  . Chest pain 04/24/2016  . Morbid obesity ([place]) 01/29/2016  . Elevated ALT measurement 10/30/2015  . Nonspecific elevation of levels of transaminase and lactic acid dehydrogenase (ldh) 10/30/2015  . Reduced libido 10/29/2015  . Hyperlipidemia 08/06/2015  . Anxiety 08/06/2015  . Atherosclerosis of coronary artery 08/06/2015  . Childhood asthma 08/06/2015  . Chronic obstructive pulmonary disease ([place]) 08/06/2015  . OSA (obstructive sleep apnea) 08/06/2015  . Anxiety disorder 08/06/2015  . Atherosclerosis of native coronary artery of native heart with stable angina pectoris ([place]) 08/06/2015  . Uncomplicated asthma 62/70/3500   Janna Arch, PT, DPT   04/10/2021, 4:24 PM  [place] MAIN  [place], [place], [address] Phone: [phone]   Fax:  [phone]  Name: HAVEN FOSS MRN: [id number] Date of Birth: [birth year]

## 2021-04-14 ENCOUNTER — Ambulatory Visit: Payer: BC Managed Care – PPO

## 2021-04-17 ENCOUNTER — Other Ambulatory Visit: Payer: Self-pay

## 2021-04-17 ENCOUNTER — Ambulatory Visit: Payer: BC Managed Care – PPO

## 2021-04-17 DIAGNOSIS — R2681 Unsteadiness on feet: Secondary | ICD-10-CM

## 2021-04-17 DIAGNOSIS — R42 Dizziness and giddiness: Secondary | ICD-10-CM

## 2021-04-17 DIAGNOSIS — M6281 Muscle weakness (generalized): Secondary | ICD-10-CM

## 2021-04-17 NOTE — Therapy (Signed)
[place] MAIN [facility] SERVICES [address] [place], [place], [address] Phone: [phone]   Fax:  [phone]  Physical Therapy Treatment  Patient Details  Name: Cory Espinoza MRN: [id number] Date of Birth: [birth year] Referring Provider (PT): Dr. Joselyn Arrow   Encounter Date: 04/17/2021   PT End of Session - 04/17/21 1550    Visit Number 41    Number of Visits 36    Date for PT Re-Evaluation 05/29/21    Authorization Type [employer] PPO;  1/10 PN 04/10/21    Authorization Time Period 01/09/21-04/03/21    PT Start Time 1500    PT Stop Time 1546    PT Time Calculation (min) 46 min    Equipment Utilized During Treatment Gait belt    Activity Tolerance Patient tolerated treatment well;No increased pain    Behavior During Therapy WFL for tasks assessed/performed           Past Medical History:  Diagnosis Date  . Allergy   . Anginal pain ([place])   . Anxiety   . Arthritis    lumbar spine  . Asthma   . Atrial fibrillation ([place])   . Back pain    Severe Lumbar Pain  . CHF (congestive heart failure) ([place])   . COPD (chronic obstructive pulmonary disease) (H[facility])    Restrictive lung disease  . Coronary artery disease   . Dyspnea   . Dysrhythmia    afib  . GERD (gastroesophageal reflux disease)   . Headache    [name], onset October 2020, cluster headaches in the past  . History of kidney stones   . Hyperlipidemia   . Hypertension   . Myocardial infarction ([place])    at age 50  . Pneumonia   . Restrictive lung disease   . Sleep apnea    unable to use cpap since onset of migraines    Past Surgical History:  Procedure Laterality Date  . ABDOMINAL EXPOSURE N/A 03/25/2020   Procedure: ABDOMINAL EXPOSURE;  Surgeon: Rosetta Posner, MD;  Location: [facility] OR;  Service: Vascular;  Laterality: N/A;  . ANTERIOR CERVICAL DECOMP/DISCECTOMY FUSION N/A 01/23/2020   Procedure: ANTERIOR CERVICAL DECOMPRESSION FUSION - CERVICAL SIX-CERVICAL SEVEN;  Surgeon:  Earnie Larsson, MD;  Location: [place];  Service: Neurosurgery;  Laterality: N/A;  . ANTERIOR LUMBAR FUSION N/A 03/25/2020   Procedure: ANTERIOR LUMBAR INTERBODY FUSION LUMBAR FIVE-SACRAL ONE.;  Surgeon: Earnie Larsson, MD;  Location: [place];  Service: Neurosurgery;  Laterality: N/A;  anterior  . BACK SURGERY  1996  . CARDIAC CATHETERIZATION Left 04/27/2016   Procedure: Left Heart Cath and Coronary Angiography;  Surgeon: Corey Skains, MD;  Location: [place] CV LAB;  Service: Cardiovascular;  Laterality: Left;  . CARDIAC CATHETERIZATION N/A 04/27/2016   Procedure: Intravascular Pressure Wire/FFR Study;  Surgeon: Yolonda Kida, MD;  Location: [place] [facility];  Service: Cardiovascular;  Laterality: N/A;  . CATHETERIZATION OF PULMONARY ARTERY WITH RETRIEVAL OF FOREIGN BODY Bilateral 04/07/2011   heart  . DEGENERATIVE SPONDYLOLISTHESIS    . KNEE ARTHROSCOPY Bilateral 2004  . [place] SURGERY  1999  . RADICULOPATHY, CERVICAL REGION    . TONSILLECTOMY      There were no vitals filed for this visit.   Subjective Assessment - 04/17/21 1509    Subjective Patient missed last session due to severe migraines. [place] since he was last seen. [place] on ground tripped on rake which hit him on the shoulder. Was on the ground and  crawled over to push self up.    Pertinent History Pt has a complex medical history. 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 He states that he fell 10-12 feet and struck his head, neck, and back. He was able to finish the course which took him approximately 45 minutes more to complete. He does endorse a second fall while finishing the course but did not suffer a second head injury. He states that he started getting headaches that day which have persisted since that time. 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 He does report improvement in his headaches recently.  Neurology was concerned about possible BPPV and have referred him for a vestibular evaluation. In addition since the injury, pt developed RUE and RLE pain and weakness. Pt states that NCV showed abnormal nerve conduction in RUE. He has since undergone a C7-7 ACDF on 01/23/20. He reports initial improvement in RUE strength, but states that he has a gradual return of his RUE weakness. He was also having significant RLE weakness and pain and underwent and L5-S1 anterior lumbar interbody fusion on 03/25/20. Patient reports that he does not have any back precautions but needs to wear the low back brace for one more month. Patient reports he has a follow-up appointment with the surgeon next month. Patient reports he has a follow-up appointment with Dr. Brigitte Pulse, neurologist, in August. He arrives to therapy ambulating with an upright rollator walker. Pt denies any mention of concussion after his injury. He has been having cognitive issues since his injury and has previously had a heavy metals screen as well as neurocognitive testing. He has had an MRI of his brain with and without contrast on 12/18/19. Results were mildly motion degraded examination. No evidence of acute intracranial abnormality. Minimal chronic small vessel ischemic disease. He has tremor in both his hands. He has a positive family history of Parkinson's disease in his grandfather. He is complaining of constant dizziness and unsteadiness which are worse with activity. Patient states that he frequently loses his balance and his wife has to steady him.    Limitations Lifting;Standing;Walking;House hold activities    Diagnostic tests He has had an MRI of his brain with and without contrast on 12/18/19. Results were mildly motion degraded examination. No evidence of acute intracranial abnormality. Minimal chronic small vessel ischemic disease.    Patient Stated Goals to be able to ambulate with a cane or no AD, to be able to drive, to be able to ride his  motorcycle    Currently in Pain? Yes    Pain Score 6     Pain Location Head    Pain Descriptors / Indicators Aching    Pain Type Chronic pain    Pain Onset More than a month ago    Pain Frequency Intermittent               5x STS -deferred  BERG-deferred 6 min walk test-deferred ABC FOTO: 52%  TUG-deferred 10 MWT-deferred     Treat:   Seated:  4lb weight: -LAQ 15x each IB:BCWU fatiguing -March 15x each LE to PT hand  -IR/ER 15xover line for coordination  - forward step over theraband and back 15x each LE;  -heel raise 15x  Weighted ball (2000 gr) -Between knees center pull 10x  -Straight arm twists 10x  -Chest press 10x  Toe tap on line 30  seconds for coordination, sequencing, timing and muscle recruitment  RTB abduction 20x one LE at a time RTB adduction 15x each LE; challenging to perform last 5 repetitions RTB around bilateral ankles: alternating LAQ 15x each LE BTB hamstring curls 10x each LE   Pt educated throughout session about proper posture and technique with exercises. Improved exercise technique, movement at target joints, use of target muscles after min to mod verbal, visual, tactile cues     Access Code: [email or web address] URL: [email or web address] Date: 04/17/2021 Prepared by: Janna Arch  Exercises Forward Walking Lunge in [place] - 1 x daily - 7 x weekly - 2 sets - 10 reps - 5 hold Forward Hops in [place] - 1 x daily - 7 x weekly - 2 sets - 10 reps - 5 hold Jumping in Place - 1 x daily - 7 x weekly - 2 sets - 10 reps - 5 hold Waist Circles - 1 x daily - 7 x weekly - 2 sets - 10 reps - 5 hold Side Step Over in [place] - 1 x daily - 7 x weekly - 2 sets - 10 reps - 5 hold   Patient has significant migraine requiring him to perform interventions in darkened exam room with minimal noises. FOTO performed scoring 52% and ABC given to perform at home. Will perform 6 min walk test next session patient is able to  be in hallway. Pool HEP given as patient has a pool at home to utilize between sessions. The patient continues to benefit from additional skilled PT services to improve LE strength and balance for decreased fall risk and improved quality of life           PT Education - 04/17/21 1454    Education Details exercise technique, body mechanics    Person(s) Educated Patient    Methods Explanation;Demonstration;Tactile cues;Verbal cues    Comprehension Verbalized understanding;Returned demonstration;Verbal cues required;Tactile cues required            PT Short Term Goals - 03/06/21 1601      PT SHORT TERM GOAL #1   Title Patient will be independent in home exercise program to improve strength/mobility for better functional independence with ADLs and for self-management.    Baseline 3/3 HEP compliant 3/31 hep compliant    Time 6    Period Weeks    Status Achieved    Target Date 02/20/21      PT SHORT TERM GOAL #2   Title Patient will be modified independent in walking on even/uneven surface with least restrictive assistive device, for 10+ minutes without rest break, reporting some difficulty or less to improve walking tolerance with community ambulation including grocery shopping, going to church,etc.    Baseline 02/03: instability, LOB multiple times while ambulating to elevator 3/31: unable to test due to throwing up    Time 6    Period Weeks    Status On-going    Target Date 04/17/21      PT SHORT TERM GOAL #3   Title Patient will increase ABC scale score >80% to demonstrate better functional mobility and better confidence with ADLs.    Baseline 01/09/21: 60.625% 3/3/: 50% 3/31: 63%    Time 6    Period Weeks    Status Partially Met    Target Date 04/17/21             PT Long Term Goals - 03/06/21 1826      PT LONG TERM GOAL #1  Title Patient will increase FOTO score to equal to or greater than 65 to demonstrate statistically significant improvement in mobility and  quality of life.    Baseline scored 41/100 on 05/21/2020; 7/22 40, 8/23: 60/100, 09/30/20=40, 01/09/21: 46 3/3: 40.3% 3/31: 49%    Time 12    Period Weeks    Status Partially Met    Target Date 05/29/21      PT LONG TERM GOAL #2   Title Patient will reduce falls risk as indicated by decreased TUG time to less than 11 seconds.    Baseline scored 37.9 sec with TUG on 05/21/2020; 21seconds with elevated rollator 7/22, 8/23: 13.62 sec with up and go walker, 01/09/21: 15.81s with hiking stick 3/3: 17.98 seconds one near LOB 3/31: 15.57 seconds with walking stick    Time 12    Period Weeks    Status Partially Met    Target Date 05/29/21      PT LONG TERM GOAL #3   Title Patient will increase right UE and LE gross strength to 4+/5 throughout to improve functional strength for independent gait, increased standing tolerance and increased ADL ability.    Baseline grossly +3/5 to -4/5 right UE and LE strength on 05/21/2020; grossly 4-/5 for RUE, grossly 4-/5 for RLE on 7/22, grossly 4-/5 RUE, grossly 4/5 RLE 3/3: see note    Time 8    Period Weeks    Status Partially Met    Target Date 05/29/21      PT LONG TERM GOAL #4   Title Patient will increase 10 meter walk test to >1.75ms as to improve gait speed for better community ambulation and to reduce fall risk.    Baseline on 7/22 .57 m/sself selected, fast  .73 m/s with LOB pt reported R knee buckling, elevated rollator used, 8/23: 1.06 m/s with up and go walker, 01/09/21: 0.72 m/s with hiking stick 3/3: 0.74 m/s with walking stick 3/31: 0.81 m/s w walking stick    Time 12    Period Weeks    Status Partially Met    Target Date 05/29/21      PT LONG TERM GOAL #5   Title Patient will increase Berg Balance score by > 6 points to demonstrate decreased fall risk during functional activities.    Baseline 8/23: 37/56, 09/30/20=40/56, 12/20: 42/56 3/3: deferred 3/31 will perform next time; deferred due to throwing up    Time 8    Period Weeks    Status  Deferred    Target Date 05/29/21      PT LONG TERM GOAL #6   Title Patient (< 64years old) will complete five times sit to stand test in < 10 seconds indicating an increased LE strength and improved balance.    Baseline 8/23: 22 sec, 09/30/20=13.04 sec, 12/20: 27.05 sec, 01/09/21: 18.01s 3/3: 23 seconds no UE support 3/31; deferred due to patient throwing up    Time 12    Period Weeks    Status Partially Met    Target Date 05/29/21      PT LONG TERM GOAL #7   Title Patient will improve 6 min walk test >1000 feet with LRAD for improved gait ability in community.    Baseline 8/23: not tested; 8/30 845, goal adjusted >20064f 09/30/20=600 feet - stopped test due to R ankle catching making gait unsafe, 11/25/20= 330 feet - stopped test due to severe dizziness, vomiting/nausea, sweating making gait unsafe; 01/09/21: 720 ft with no rest breaks 3/3: deferred  due to nausea 3/31: deferred due to patient throwing up    Time 12    Period Weeks    Status Deferred    Target Date 05/29/21                 Plan - 04/17/21 1551    Clinical Impression Statement Patient has significant migraine requiring him to perform interventions in darkened exam room with minimal noises. FOTO performed scoring 52% and ABC given to perform at home. Will perform 6 min walk test next session patient is able to be in hallway. Pool HEP given as patient has a pool at home to utilize between sessions. The patient continues to benefit from additional skilled PT services to improve LE strength and balance for decreased fall risk and improved quality of life    Personal Factors and Comorbidities Comorbidity 3+;Time since onset of injury/illness/exacerbation    Comorbidities anxiety, back pain, COPD, headache, HTN, MI, sleep apnea, migraines    Examination-Activity Limitations Locomotion Level;Transfers;Bed Mobility;Stand;Stairs;Sleep;Squat;Bend    Examination-Participation Restrictions Driving;Medication Management     Stability/Clinical Decision Making Unstable/Unpredictable    Rehab Potential Fair    PT Frequency 2x / week    PT Duration 12 weeks    PT Treatment/Interventions ADLs/Self Care Home Management;Canalith Repostioning;Moist Heat;Electrical Stimulation;Gait training;Stair training;Functional mobility training;Therapeutic activities;Therapeutic exercise;Balance training;Neuromuscular re-education;Patient/family education;Manual techniques;Passive range of motion;Vestibular    PT Next Visit Plan update HEP ea session for continued strengthening/balanceing at home    PT [place] Access Code: [email or web address] updated this session    Consulted and Agree with Plan of Care Patient;Other (Comment)   Friend          Patient will benefit from skilled therapeutic intervention in order to improve the following deficits and impairments:  Decreased activity tolerance,Decreased balance,Decreased cognition,Decreased mobility,Difficulty walking,Abnormal gait,Decreased range of motion,Dizziness,Pain,Impaired flexibility,Decreased strength,Impaired sensation,Decreased endurance,Decreased safety awareness  Visit Diagnosis: Muscle weakness (generalized)  Unsteadiness on feet  Dizziness and giddiness     Problem List Patient Active Problem List   Diagnosis Date Noted  . Degenerative spondylolisthesis 03/25/2020  . Physical deconditioning 03/04/2020  . Lobar pneumonia, unspecified organism ([place]) 03/04/2020  . Intractable hemiplegic migraine with status migrainosus 03/02/2020  . Current tobacco use 02/05/2020  . Abnormal findings on diagnostic imaging of lung 02/05/2020  . Shortness of breath 02/05/2020  . Herniated disc, cervical 01/23/2020  . Cervical spondylosis with myelopathy and radiculopathy 01/23/2020  . Pre-operative respiratory examination 01/12/2020  . Weakness on right side of face 12/22/2019  . Osteoarthritis of spine with radiculopathy, lumbar region 12/11/2019  . Intractable  migraine with aura with status migrainosus 11/09/2019  . Former smoker 08/27/2018  . Benign essential HTN 08/27/2018  . Cough productive of purulent sputum 11/08/2017  . GERD (gastroesophageal reflux disease) 05/06/2017  . Contusion of hand 04/22/2017  . BP (high blood pressure) 04/22/2017  . Oxygen desaturation 04/22/2017  . Prostate lump 04/22/2017  . Unstable angina ([place]) 04/27/2016  . Chest pain 04/24/2016  . Morbid obesity ([place]) 01/29/2016  . Elevated ALT measurement 10/30/2015  . Nonspecific elevation of levels of transaminase and lactic acid dehydrogenase (ldh) 10/30/2015  . Reduced libido 10/29/2015  . Hyperlipidemia 08/06/2015  . Anxiety 08/06/2015  . Atherosclerosis of coronary artery 08/06/2015  . Childhood asthma 08/06/2015  . Chronic obstructive pulmonary disease ([place]) 08/06/2015  . OSA (obstructive sleep apnea) 08/06/2015  . Anxiety disorder 08/06/2015  . Atherosclerosis of native coronary artery of native heart with stable angina pectoris ([place]) 08/06/2015  .  Uncomplicated asthma 18/98/4210   Janna Arch, PT, DPT   04/17/2021, 3:52 PM  [place] MAIN [facility] SERVICES [address] [place], [place], [address] Phone: [phone]   Fax:  [phone]  Name: JEROME VIGLIONE MRN: [id number] Date of Birth: [birth year]

## 2021-04-21 ENCOUNTER — Ambulatory Visit: Payer: BC Managed Care – PPO

## 2021-04-24 ENCOUNTER — Other Ambulatory Visit: Payer: Self-pay

## 2021-04-24 ENCOUNTER — Ambulatory Visit: Payer: BC Managed Care – PPO

## 2021-04-24 DIAGNOSIS — M6281 Muscle weakness (generalized): Secondary | ICD-10-CM

## 2021-04-24 DIAGNOSIS — R2681 Unsteadiness on feet: Secondary | ICD-10-CM

## 2021-04-24 DIAGNOSIS — R42 Dizziness and giddiness: Secondary | ICD-10-CM

## 2021-04-24 NOTE — Therapy (Signed)
[place] [facility] MAIN [facility] SERVICES [address] [place], [place], [address] Phone: [phone]   Fax:  [phone]  Patient Details  Name: Cory Espinoza MRN: [id number] Date of Birth: 12/13/1970 Referring Provider:  Jerl Mina, MD  Encounter Date: 04/24/2021     Patient arrived to PT, reported having a fall Saturday where he hit his head and a seizure Sunday. Reports he was outside all morning and did not drink water but had water at lunch and was feeling up for trying his six minute walk test. 500 ft into six minute walk test patient experienced symptoms indicating possible absent seizure requiring PT to stabilize in upright position. Blank state lasted ~10 seconds with patient returning to consciousness and PT transferred patient to chair with assistance from manager. Patient had a subsequent blank episodes/seizure episode lasting ~8 seconds. Initially patient remembered what had occurred but after ~15 minutes had completely forgotten he had even participated in the six minute walk test.  Patient had one more blank state ~4-5 seconds. He refused to go to the ED or to have his physician called but did agree to his wife being called as a family friend had been the one to take him. Patient stabilized and affect became more normalized after ~20 minutes, still being adamant about refusing ED. Patient wheeled back up to his family friends car. Wife called again and stated not to go to ED at this time however if he had any more seizure activity tonight she agreed to take him. This author with patient for >1 hour to ensure safety of patient. This Thereasa Parkin will follow up within an hour via phone call to ensure patient is still ok and to enforce recommendation to go to ED if symptoms persist.   Precious Bard, PT, DPT   04/24/2021, 4:51 PM   [facility] MAIN [facility] SERVICES [address] [place], [place], [address] Phone:  [phone]   Fax:  [phone]

## 2021-04-28 ENCOUNTER — Other Ambulatory Visit: Payer: Self-pay

## 2021-04-28 ENCOUNTER — Ambulatory Visit: Payer: BC Managed Care – PPO

## 2021-04-28 DIAGNOSIS — M6281 Muscle weakness (generalized): Secondary | ICD-10-CM

## 2021-04-28 DIAGNOSIS — R42 Dizziness and giddiness: Secondary | ICD-10-CM

## 2021-04-28 DIAGNOSIS — R2681 Unsteadiness on feet: Secondary | ICD-10-CM

## 2021-04-28 NOTE — Therapy (Signed)
[place] MAIN [facility] SERVICES [address] [place], [place], [address] Phone: [phone]   Fax:  [phone]  Physical Therapy Treatment  Patient Details  Name: Cory Espinoza MRN: [id number] Date of Birth: May 01, 1971 Referring Provider (PT): Dr. Joselyn Arrow   Encounter Date: 04/28/2021   PT End of Session - 04/28/21 1636    Visit Number 42    Number of Visits 49    Date for PT Re-Evaluation 05/29/21    Authorization Type [employer]BS PPO;  2/10 PN 04/10/21    Authorization Time Period 01/09/21-04/03/21    PT Start Time 1517    PT Stop Time 1601    PT Time Calculation (min) 44 min    Equipment Utilized During Treatment Gait belt    Activity Tolerance Patient tolerated treatment well;No increased pain    Behavior During Therapy WFL for tasks assessed/performed           Past Medical History:  Diagnosis Date  . Allergy   . Anginal pain ([place])   . Anxiety   . Arthritis    lumbar spine  . Asthma   . Atrial fibrillation ([place])   . Back pain    Severe Lumbar Pain  . CHF (congestive heart failure) ([place])   . COPD (chronic obstructive pulmonary disease) ([facility])    Restrictive lung disease  . Coronary artery disease   . Dyspnea   . Dysrhythmia    afib  . GERD (gastroesophageal reflux disease)   . Headache    [name], onset October 2020, cluster headaches in the past  . History of kidney stones   . Hyperlipidemia   . Hypertension   . Myocardial infarction ([place])    at age 37  . Pneumonia   . Restrictive lung disease   . Sleep apnea    unable to use cpap since onset of migraines    Past Surgical History:  Procedure Laterality Date  . ABDOMINAL EXPOSURE N/A 03/25/2020   Procedure: ABDOMINAL EXPOSURE;  Surgeon: Rosetta Posner, MD;  Location: [facility] OR;  Service: Vascular;  Laterality: N/A;  . ANTERIOR CERVICAL DECOMP/DISCECTOMY FUSION N/A 01/23/2020   Procedure: ANTERIOR CERVICAL DECOMPRESSION FUSION - CERVICAL SIX-CERVICAL SEVEN;  Surgeon:  Earnie Larsson, MD;  Location: [place];  Service: Neurosurgery;  Laterality: N/A;  . ANTERIOR LUMBAR FUSION N/A 03/25/2020   Procedure: ANTERIOR LUMBAR INTERBODY FUSION LUMBAR FIVE-SACRAL ONE.;  Surgeon: Earnie Larsson, MD;  Location: [place];  Service: Neurosurgery;  Laterality: N/A;  anterior  . BACK SURGERY  1996  . CARDIAC CATHETERIZATION Left 04/27/2016   Procedure: Left Heart Cath and Coronary Angiography;  Surgeon: Corey Skains, MD;  Location: [place] CV LAB;  Service: Cardiovascular;  Laterality: Left;  . CARDIAC CATHETERIZATION N/A 04/27/2016   Procedure: Intravascular Pressure Wire/FFR Study;  Surgeon: Yolonda Kida, MD;  Location: [place] [facility];  Service: Cardiovascular;  Laterality: N/A;  . CATHETERIZATION OF PULMONARY ARTERY WITH RETRIEVAL OF FOREIGN BODY Bilateral 04/07/2011   heart  . DEGENERATIVE SPONDYLOLISTHESIS    . KNEE ARTHROSCOPY Bilateral 2004  . [place] SURGERY  1999  . RADICULOPATHY, CERVICAL REGION    . TONSILLECTOMY      There were no vitals filed for this visit.   Subjective Assessment - 04/28/21 1634    Subjective Patient presents with his wife. Reports he slept after his seizure at last appointment. Saw the neurologist earlier and are going to try to change some of his medications.  Pertinent History Pt has a complex medical history. 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 He states that he fell 10-12 feet and struck his head, neck, and back. He was able to finish the course which took him approximately 45 minutes more to complete. He does endorse a second fall while finishing the course but did not suffer a second head injury. He states that he started getting headaches that day which have persisted since that time. 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 He does report improvement in his headaches recently. Neurology was concerned about  possible BPPV and have referred him for a vestibular evaluation. In addition since the injury, pt developed RUE and RLE pain and weakness. Pt states that NCV showed abnormal nerve conduction in RUE. He has since undergone a C7-7 ACDF on 01/23/20. He reports initial improvement in RUE strength, but states that he has a gradual return of his RUE weakness. He was also having significant RLE weakness and pain and underwent and L5-S1 anterior lumbar interbody fusion on 03/25/20. Patient reports that he does not have any back precautions but needs to wear the low back brace for one more month. Patient reports he has a follow-up appointment with the surgeon next month. Patient reports he has a follow-up appointment with Dr. Brigitte Pulse, neurologist, in August. He arrives to therapy ambulating with an upright rollator walker. Pt denies any mention of concussion after his injury. He has been having cognitive issues since his injury and has previously had a heavy metals screen as well as neurocognitive testing. He has had an MRI of his brain with and without contrast on 12/18/19. Results were mildly motion degraded examination. No evidence of acute intracranial abnormality. Minimal chronic small vessel ischemic disease. He has tremor in both his hands. He has a positive family history of Parkinson's disease in his grandfather. He is complaining of constant dizziness and unsteadiness which are worse with activity. Patient states that he frequently loses his balance and his wife has to steady him.    Limitations Lifting;Standing;Walking;House hold activities    Diagnostic tests He has had an MRI of his brain with and without contrast on 12/18/19. Results were mildly motion degraded examination. No evidence of acute intracranial abnormality. Minimal chronic small vessel ischemic disease.    Patient Stated Goals to be able to ambulate with a cane or no AD, to be able to drive, to be able to ride his motorcycle    Currently in Pain? Yes     Pain Score 8     Pain Location Head    Pain Descriptors / Indicators Aching;Stabbing    Pain Type Chronic pain    Pain Onset More than a month ago    Pain Frequency Intermittent              in room with lights off and noise damped due to migraine.   Seated:  4lb weight: -LAQ 15x each EG:BTDV fatiguing -March 15x each LE -IR/ER 15x  -step over theraband and back 10x each LE; 2 sets -lateral step over theraband and back 10x each LE; 2 sets  -heel toe raise 15x    BTB adduction 15x each LE ; challenging to perform last 5 repetitions  RTB hamstring curls 15x each LE   STM to bilateral upper traps, levator scap, and cervical paraspinals x 13 minutes, implementation of effleurage  and ptrissage included.  patient reports decreased headache pain by end of session.   ABC: 53%   Pt educated throughout session about proper posture and technique with exercises. Improved exercise technique, movement at target joints, use of target muscles after min to mod verbal, visual, tactile cues   Patient presents with increased migraine pain due to weather. Extensive soft tissue on neck region, specifically upper trap and levator scap result in decrease in migraine per patient reports. Increased strength in bilateral LE noted with increased weight resistance tolerated well. The patient continues to benefit from additional skilled PT services to improve LE strength and balance for decreased fall risk and improved quality of life                        PT Education - 04/28/21 1635    Education Details exercise technique, body mechanics    Person(s) Educated Patient    Methods Explanation;Demonstration;Tactile cues;Verbal cues    Comprehension Verbalized understanding;Returned demonstration;Verbal cues required;Tactile cues required            PT Short Term Goals - 03/06/21 1601      PT SHORT TERM GOAL #1   Title Patient will be independent in home exercise  program to improve strength/mobility for better functional independence with ADLs and for self-management.    Baseline 3/3 HEP compliant 3/31 hep compliant    Time 6    Period Weeks    Status Achieved    Target Date 02/20/21      PT SHORT TERM GOAL #2   Title Patient will be modified independent in walking on even/uneven surface with least restrictive assistive device, for 10+ minutes without rest break, reporting some difficulty or less to improve walking tolerance with community ambulation including grocery shopping, going to church,etc.    Baseline 02/03: instability, LOB multiple times while ambulating to elevator 3/31: unable to test due to throwing up    Time 6    Period Weeks    Status On-going    Target Date 04/17/21      PT SHORT TERM GOAL #3   Title Patient will increase ABC scale score >80% to demonstrate better functional mobility and better confidence with ADLs.    Baseline 01/09/21: 60.625% 3/3/: 50% 3/31: 63%    Time 6    Period Weeks    Status Partially Met    Target Date 04/17/21             PT Long Term Goals - 03/06/21 1826      PT LONG TERM GOAL #1   Title Patient will increase FOTO score to equal to or greater than 65 to demonstrate statistically significant improvement in mobility and quality of life.    Baseline scored 41/100 on 05/21/2020; 7/22 40, 8/23: 60/100, 09/30/20=40, 01/09/21: 46 3/3: 40.3% 3/31: 49%    Time 12    Period Weeks    Status Partially Met    Target Date 05/29/21      PT LONG TERM GOAL #2   Title Patient will reduce falls risk as indicated by decreased TUG time to less than 11 seconds.    Baseline scored 37.9 sec with TUG on 05/21/2020; 21seconds with elevated rollator 7/22, 8/23: 13.62 sec with up and go walker, 01/09/21: 15.81s with hiking stick 3/3: 17.98 seconds one near LOB 3/31: 15.57 seconds with walking stick    Time 12    Period Weeks    Status Partially Met  Target Date 05/29/21      PT LONG TERM GOAL #3   Title  Patient will increase right UE and LE gross strength to 4+/5 throughout to improve functional strength for independent gait, increased standing tolerance and increased ADL ability.    Baseline grossly +3/5 to -4/5 right UE and LE strength on 05/21/2020; grossly 4-/5 for RUE, grossly 4-/5 for RLE on 7/22, grossly 4-/5 RUE, grossly 4/5 RLE 3/3: see note    Time 8    Period Weeks    Status Partially Met    Target Date 05/29/21      PT LONG TERM GOAL #4   Title Patient will increase 10 meter walk test to >1.23ms as to improve gait speed for better community ambulation and to reduce fall risk.    Baseline on 7/22 .57 m/sself selected, fast  .73 m/s with LOB pt reported R knee buckling, elevated rollator used, 8/23: 1.06 m/s with up and go walker, 01/09/21: 0.72 m/s with hiking stick 3/3: 0.74 m/s with walking stick 3/31: 0.81 m/s w walking stick    Time 12    Period Weeks    Status Partially Met    Target Date 05/29/21      PT LONG TERM GOAL #5   Title Patient will increase Berg Balance score by > 6 points to demonstrate decreased fall risk during functional activities.    Baseline 8/23: 37/56, 09/30/20=40/56, 12/20: 42/56 3/3: deferred 3/31 will perform next time; deferred due to throwing up    Time 8    Period Weeks    Status Deferred    Target Date 05/29/21      PT LONG TERM GOAL #6   Title Patient (< 63years old) will complete five times sit to stand test in < 10 seconds indicating an increased LE strength and improved balance.    Baseline 8/23: 22 sec, 09/30/20=13.04 sec, 12/20: 27.05 sec, 01/09/21: 18.01s 3/3: 23 seconds no UE support 3/31; deferred due to patient throwing up    Time 12    Period Weeks    Status Partially Met    Target Date 05/29/21      PT LONG TERM GOAL #7   Title Patient will improve 6 min walk test >1000 feet with LRAD for improved gait ability in community.    Baseline 8/23: not tested; 8/30 845, goal adjusted >2001f 09/30/20=600 feet - stopped test due to R  ankle catching making gait unsafe, 11/25/20= 330 feet - stopped test due to severe dizziness, vomiting/nausea, sweating making gait unsafe; 01/09/21: 720 ft with no rest breaks 3/3: deferred due to nausea 3/31: deferred due to patient throwing up    Time 12    Period Weeks    Status Deferred    Target Date 05/29/21                 Plan - 04/28/21 1642    Clinical Impression Statement Patient presents with increased migraine pain due to weather. Extensive soft tissue on neck region, specifically upper trap and levator scap result in decrease in migraine per patient reports. Increased strength in bilateral LE noted with increased weight resistance tolerated well. The patient continues to benefit from additional skilled PT services to improve LE strength and balance for decreased fall risk and improved quality of life    Personal Factors and Comorbidities Comorbidity 3+;Time since onset of injury/illness/exacerbation    Comorbidities anxiety, back pain, COPD, headache, HTN, MI, sleep apnea, migraines    Examination-Activity  Limitations Locomotion Level;Transfers;Bed Mobility;Stand;Stairs;Sleep;Squat;Bend    Examination-Participation Restrictions Driving;Medication Management    Stability/Clinical Decision Making Unstable/Unpredictable    Rehab Potential Fair    PT Frequency 2x / week    PT Duration 12 weeks    PT Treatment/Interventions ADLs/Self Care Home Management;Canalith Repostioning;Moist Heat;Electrical Stimulation;Gait training;Stair training;Functional mobility training;Therapeutic activities;Therapeutic exercise;Balance training;Neuromuscular re-education;Patient/family education;Manual techniques;Passive range of motion;Vestibular    PT Next Visit Plan update HEP ea session for continued strengthening/balanceing at home    PT [place] Access Code: [email or web address] updated this session    Consulted and Agree with Plan of Care Patient;Other (Comment)   Friend           Patient will benefit from skilled therapeutic intervention in order to improve the following deficits and impairments:  Decreased activity tolerance,Decreased balance,Decreased cognition,Decreased mobility,Difficulty walking,Abnormal gait,Decreased range of motion,Dizziness,Pain,Impaired flexibility,Decreased strength,Impaired sensation,Decreased endurance,Decreased safety awareness  Visit Diagnosis: Muscle weakness (generalized)  Unsteadiness on feet  Dizziness and giddiness     Problem List Patient Active Problem List   Diagnosis Date Noted  . Degenerative spondylolisthesis 03/25/2020  . Physical deconditioning 03/04/2020  . Lobar pneumonia, unspecified organism ([place]) 03/04/2020  . Intractable hemiplegic migraine with status migrainosus 03/02/2020  . Current tobacco use 02/05/2020  . Abnormal findings on diagnostic imaging of lung 02/05/2020  . Shortness of breath 02/05/2020  . Herniated disc, cervical 01/23/2020  . Cervical spondylosis with myelopathy and radiculopathy 01/23/2020  . Pre-operative respiratory examination 01/12/2020  . Weakness on right side of face 12/22/2019  . Osteoarthritis of spine with radiculopathy, lumbar region 12/11/2019  . Intractable migraine with aura with status migrainosus 11/09/2019  . Former smoker 08/27/2018  . Benign essential HTN 08/27/2018  . Cough productive of purulent sputum 11/08/2017  . GERD (gastroesophageal reflux disease) 05/06/2017  . Contusion of hand 04/22/2017  . BP (high blood pressure) 04/22/2017  . Oxygen desaturation 04/22/2017  . Prostate lump 04/22/2017  . Unstable angina ([place]) 04/27/2016  . Chest pain 04/24/2016  . Morbid obesity ([place]) 01/29/2016  . Elevated ALT measurement 10/30/2015  . Nonspecific elevation of levels of transaminase and lactic acid dehydrogenase (ldh) 10/30/2015  . Reduced libido 10/29/2015  . Hyperlipidemia 08/06/2015  . Anxiety 08/06/2015  . Atherosclerosis of coronary artery 08/06/2015   . Childhood asthma 08/06/2015  . Chronic obstructive pulmonary disease ([place]) 08/06/2015  . OSA (obstructive sleep apnea) 08/06/2015  . Anxiety disorder 08/06/2015  . Atherosclerosis of native coronary artery of native heart with stable angina pectoris ([place]) 08/06/2015  . Uncomplicated asthma 38/09/1750   Janna Arch, PT, DPT   04/28/2021, 4:44 PM  [place] MAIN [facility] SERVICES [address] [place], [place], [address] Phone: [phone]   Fax:  [phone]  Name: Cory Espinoza MRN: [id number] Date of Birth: [birth year]

## 2021-05-01 ENCOUNTER — Ambulatory Visit: Payer: BC Managed Care – PPO

## 2021-05-08 ENCOUNTER — Ambulatory Visit: Payer: BC Managed Care – PPO | Attending: Neurology

## 2021-05-08 ENCOUNTER — Other Ambulatory Visit: Payer: Self-pay

## 2021-05-08 DIAGNOSIS — M6281 Muscle weakness (generalized): Secondary | ICD-10-CM | POA: Diagnosis present

## 2021-05-08 DIAGNOSIS — R42 Dizziness and giddiness: Secondary | ICD-10-CM | POA: Diagnosis present

## 2021-05-08 DIAGNOSIS — R2681 Unsteadiness on feet: Secondary | ICD-10-CM | POA: Diagnosis present

## 2021-05-08 NOTE — Therapy (Signed)
[place] MAIN [facility] SERVICES [address] [place], [place], [address] Phone: [phone]   Fax:  [phone]  Physical Therapy Treatment  Patient Details  Name: Cory Espinoza MRN: [id number] Date of Birth: 10-Dec-1970 Referring Provider (PT): Dr. Joselyn Arrow   Encounter Date: 05/08/2021   PT End of Session - 05/08/21 1629    Visit Number 43    Number of Visits 53    Date for PT Re-Evaluation 05/29/21    Authorization Type [employer] PPO;  3/10 PN 04/10/21    Authorization Time Period 01/09/21-04/03/21    PT Start Time 1515    PT Stop Time 1602    PT Time Calculation (min) 47 min    Equipment Utilized During Treatment Gait belt    Activity Tolerance Patient tolerated treatment well    Behavior During Therapy [facility] for tasks assessed/performed           Past Medical History:  Diagnosis Date  . Allergy   . Anginal pain ([place])   . Anxiety   . Arthritis    lumbar spine  . Asthma   . Atrial fibrillation ([place])   . Back pain    Severe Lumbar Pain  . CHF (congestive heart failure) ([place])   . COPD (chronic obstructive pulmonary disease) (H[facility])    Restrictive lung disease  . Coronary artery disease   . Dyspnea   . Dysrhythmia    afib  . GERD (gastroesophageal reflux disease)   . Headache    [name], onset October 2020, cluster headaches in the past  . History of kidney stones   . Hyperlipidemia   . Hypertension   . Myocardial infarction ([place])    at age 50  . Pneumonia   . Restrictive lung disease   . Sleep apnea    unable to use cpap since onset of migraines    Past Surgical History:  Procedure Laterality Date  . ABDOMINAL EXPOSURE N/A 03/25/2020   Procedure: ABDOMINAL EXPOSURE;  Surgeon: Rosetta Posner, MD;  Location: [facility] OR;  Service: Vascular;  Laterality: N/A;  . ANTERIOR CERVICAL DECOMP/DISCECTOMY FUSION N/A 01/23/2020   Procedure: ANTERIOR CERVICAL DECOMPRESSION FUSION - CERVICAL SIX-CERVICAL SEVEN;  Surgeon: Earnie Larsson, MD;   Location: [facility];  Service: Neurosurgery;  Laterality: N/A;  . ANTERIOR LUMBAR FUSION N/A 03/25/2020   Procedure: ANTERIOR LUMBAR INTERBODY FUSION LUMBAR FIVE-SACRAL ONE.;  Surgeon: Earnie Larsson, MD;  Location: [facility];  Service: Neurosurgery;  Laterality: N/A;  anterior  . BACK SURGERY  1996  . CARDIAC CATHETERIZATION Left 04/27/2016   Procedure: Left Heart Cath and Coronary Angiography;  Surgeon: Corey Skains, MD;  Location: [facility] CV LAB;  Service: Cardiovascular;  Laterality: Left;  . CARDIAC CATHETERIZATION N/A 04/27/2016   Procedure: Intravascular Pressure Wire/FFR Study;  Surgeon: Yolonda Kida, MD;  Location: [facility];  Service: Cardiovascular;  Laterality: N/A;  . CATHETERIZATION OF PULMONARY ARTERY WITH RETRIEVAL OF FOREIGN BODY Bilateral 04/07/2011   heart  . DEGENERATIVE SPONDYLOLISTHESIS    . KNEE ARTHROSCOPY Bilateral 2004  . [place] SURGERY  1999  . RADICULOPATHY, CERVICAL REGION    . TONSILLECTOMY      There were no vitals filed for this visit.   Subjective Assessment - 05/08/21 1628    Subjective Patient had a seizure on Sunday while in the pool. Was leaning over the pool and a friend helped get him out. Patient started on new medication oxytiller as well as nausea patch  behind ear.    Pertinent History Pt has a complex medical history. 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 He states that he fell 10-12 feet and struck his head, neck, and back. He was able to finish the course which took him approximately 45 minutes more to complete. He does endorse a second fall while finishing the course but did not suffer a second head injury. He states that he started getting headaches that day which have persisted since that time. 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 He does report improvement in his headaches recently. Neurology was concerned about  possible BPPV and have referred him for a vestibular evaluation. In addition since the injury, pt developed RUE and RLE pain and weakness. Pt states that NCV showed abnormal nerve conduction in RUE. He has since undergone a C7-7 ACDF on 01/23/20. He reports initial improvement in RUE strength, but states that he has a gradual return of his RUE weakness. He was also having significant RLE weakness and pain and underwent and L5-S1 anterior lumbar interbody fusion on 03/25/20. Patient reports that he does not have any back precautions but needs to wear the low back brace for one more month. Patient reports he has a follow-up appointment with the surgeon next month. Patient reports he has a follow-up appointment with Dr. Brigitte Pulse, neurologist, in August. He arrives to therapy ambulating with an upright rollator walker. Pt denies any mention of concussion after his injury. He has been having cognitive issues since his injury and has previously had a heavy metals screen as well as neurocognitive testing. He has had an MRI of his brain with and without contrast on 12/18/19. Results were mildly motion degraded examination. No evidence of acute intracranial abnormality. Minimal chronic small vessel ischemic disease. He has tremor in both his hands. He has a positive family history of Parkinson's disease in his grandfather. He is complaining of constant dizziness and unsteadiness which are worse with activity. Patient states that he frequently loses his balance and his wife has to steady him.    Limitations Lifting;Standing;Walking;House hold activities    Diagnostic tests He has had an MRI of his brain with and without contrast on 12/18/19. Results were mildly motion degraded examination. No evidence of acute intracranial abnormality. Minimal chronic small vessel ischemic disease.    Patient Stated Goals to be able to ambulate with a cane or no AD, to be able to drive, to be able to ride his motorcycle    Currently in Pain? Yes     Pain Score 3     Pain Location Head    Pain Descriptors / Indicators Aching;Headache    Pain Type Chronic pain    Pain Onset More than a month ago    Pain Frequency Constant             Patient had a seizure on Sunday while in the pool. Was leaning over the pool and a friend helped get him out. Patient started on new medication oxytiller     in // bars: airex balance beam ; lateral stepping finger tip support 6x length of // bars, excessive ankle righting reactions and tremors throughout body  bosu: modified forward lunge 10x each LE; finger tip support, cues for flexion of bilateral LE  Bosu: modified lateral lunges 10x each LE finger tip support ; cue for depth of squat  airex pad: reach for ball in basket; throw ball into hoop for pertubation to patient with SUE support x15 balls step over and back half foam roller 8x each LE ; very fatiguing with buckling of RLE  Seated: seated dynadisc hamstring press 10x 3 second holds each LE  BTB adduction single limb at a time 15x each LE        Pt educated throughout session about proper posture and technique with exercises. Improved exercise technique, movement at target joints, use of target muscles after min to mod verbal, visual, tactile cues  Patient presents with good motivation to physical therapy session. He requires intermittent rest breaks due to dizziness and LE weakness with R>L. He is challenged with unstable surfaces with limited ankle righting reactions that improve with finger tip support. The patient continues to benefit from additional skilled PT services to improve LE strength and balance for decreased fall risk and improved quality of life          PT Education - 05/08/21 1629    Education Details exercise technique, body mechanics    Person(s) Educated Patient    Methods Explanation;Demonstration;Tactile cues;Verbal cues    Comprehension Verbalized understanding;Returned demonstration;Verbal cues  required;Tactile cues required            PT Short Term Goals - 03/06/21 1601      PT SHORT TERM GOAL #1   Title Patient will be independent in home exercise program to improve strength/mobility for better functional independence with ADLs and for self-management.    Baseline 3/3 HEP compliant 3/31 hep compliant    Time 6    Period Weeks    Status Achieved    Target Date 02/20/21      PT SHORT TERM GOAL #2   Title Patient will be modified independent in walking on even/uneven surface with least restrictive assistive device, for 10+ minutes without rest break, reporting some difficulty or less to improve walking tolerance with community ambulation including grocery shopping, going to church,etc.    Baseline 02/03: instability, LOB multiple times while ambulating to elevator 3/31: unable to test due to throwing up    Time 6    Period Weeks    Status On-going    Target Date 04/17/21      PT SHORT TERM GOAL #3   Title Patient will increase ABC scale score >80% to demonstrate better functional mobility and better confidence with ADLs.    Baseline 01/09/21: 60.625% 3/3/: 50% 3/31: 63%    Time 6    Period Weeks    Status Partially Met    Target Date 04/17/21             PT Long Term Goals - 03/06/21 1826      PT LONG TERM GOAL #1   Title Patient will increase FOTO score to equal to or greater than 65 to demonstrate statistically significant improvement in mobility and quality of life.    Baseline scored 41/100 on 05/21/2020; 7/22 40, 8/23: 60/100, 09/30/20=40, 01/09/21: 46 3/3: 40.3% 3/31: 49%    Time 12    Period Weeks    Status Partially Met    Target Date 05/29/21      PT LONG TERM GOAL #2   Title Patient will reduce falls risk as indicated by decreased TUG time to less than 11 seconds.    Baseline scored 37.9 sec with TUG on 05/21/2020; 21seconds with elevated rollator 7/22, 8/23: 13.62 sec with up and go walker, 01/09/21: 15.81s with  hiking stick 3/3: 17.98 seconds one  near LOB 3/31: 15.57 seconds with walking stick    Time 12    Period Weeks    Status Partially Met    Target Date 05/29/21      PT LONG TERM GOAL #3   Title Patient will increase right UE and LE gross strength to 4+/5 throughout to improve functional strength for independent gait, increased standing tolerance and increased ADL ability.    Baseline grossly +3/5 to -4/5 right UE and LE strength on 05/21/2020; grossly 4-/5 for RUE, grossly 4-/5 for RLE on 7/22, grossly 4-/5 RUE, grossly 4/5 RLE 3/3: see note    Time 8    Period Weeks    Status Partially Met    Target Date 05/29/21      PT LONG TERM GOAL #4   Title Patient will increase 10 meter walk test to >1.23ms as to improve gait speed for better community ambulation and to reduce fall risk.    Baseline on 7/22 .57 m/sself selected, fast  .73 m/s with LOB pt reported R knee buckling, elevated rollator used, 8/23: 1.06 m/s with up and go walker, 01/09/21: 0.72 m/s with hiking stick 3/3: 0.74 m/s with walking stick 3/31: 0.81 m/s w walking stick    Time 12    Period Weeks    Status Partially Met    Target Date 05/29/21      PT LONG TERM GOAL #5   Title Patient will increase Berg Balance score by > 6 points to demonstrate decreased fall risk during functional activities.    Baseline 8/23: 37/56, 09/30/20=40/56, 12/20: 42/56 3/3: deferred 3/31 will perform next time; deferred due to throwing up    Time 8    Period Weeks    Status Deferred    Target Date 05/29/21      PT LONG TERM GOAL #6   Title Patient (< 643years old) will complete five times sit to stand test in < 10 seconds indicating an increased LE strength and improved balance.    Baseline 8/23: 22 sec, 09/30/20=13.04 sec, 12/20: 27.05 sec, 01/09/21: 18.01s 3/3: 23 seconds no UE support 3/31; deferred due to patient throwing up    Time 12    Period Weeks    Status Partially Met    Target Date 05/29/21      PT LONG TERM GOAL #7   Title Patient will improve 6 min walk test  >1000 feet with LRAD for improved gait ability in community.    Baseline 8/23: not tested; 8/30 845, goal adjusted >20029f 09/30/20=600 feet - stopped test due to R ankle catching making gait unsafe, 11/25/20= 330 feet - stopped test due to severe dizziness, vomiting/nausea, sweating making gait unsafe; 01/09/21: 720 ft with no rest breaks 3/3: deferred due to nausea 3/31: deferred due to patient throwing up    Time 12    Period Weeks    Status Deferred    Target Date 05/29/21                 Plan - 05/08/21 1631    Clinical Impression Statement Patient presents with good motivation to physical therapy session. He requires intermittent rest breaks due to dizziness and LE weakness with R>L. He is challenged with unstable surfaces with limited ankle righting reactions that improve with finger tip support. The patient continues to benefit from additional skilled PT services to improve LE strength and balance for decreased fall risk and improved quality of life  Personal Factors and Comorbidities Comorbidity 3+;Time since onset of injury/illness/exacerbation    Comorbidities anxiety, back pain, COPD, headache, HTN, MI, sleep apnea, migraines    Examination-Activity Limitations Locomotion Level;Transfers;Bed Mobility;Stand;Stairs;Sleep;Squat;Bend    Examination-Participation Restrictions Driving;Medication Management    Stability/Clinical Decision Making Unstable/Unpredictable    Rehab Potential Fair    PT Frequency 2x / week    PT Duration 12 weeks    PT Treatment/Interventions ADLs/Self Care Home Management;Canalith Repostioning;Moist Heat;Electrical Stimulation;Gait training;Stair training;Functional mobility training;Therapeutic activities;Therapeutic exercise;Balance training;Neuromuscular re-education;Patient/family education;Manual techniques;Passive range of motion;Vestibular    PT Next Visit Plan update HEP ea session for continued strengthening/balanceing at home    PT [place] Access Code: [email or web address] updated this session    Consulted and Agree with Plan of Care Patient;Other (Comment)   Friend          Patient will benefit from skilled therapeutic intervention in order to improve the following deficits and impairments:  Decreased activity tolerance,Decreased balance,Decreased cognition,Decreased mobility,Difficulty walking,Abnormal gait,Decreased range of motion,Dizziness,Pain,Impaired flexibility,Decreased strength,Impaired sensation,Decreased endurance,Decreased safety awareness  Visit Diagnosis: Muscle weakness (generalized)  Unsteadiness on feet  Dizziness and giddiness     Problem List Patient Active Problem List   Diagnosis Date Noted  . Degenerative spondylolisthesis 03/25/2020  . Physical deconditioning 03/04/2020  . Lobar pneumonia, unspecified organism ([place]) 03/04/2020  . Intractable hemiplegic migraine with status migrainosus 03/02/2020  . Current tobacco use 02/05/2020  . Abnormal findings on diagnostic imaging of lung 02/05/2020  . Shortness of breath 02/05/2020  . Herniated disc, cervical 01/23/2020  . Cervical spondylosis with myelopathy and radiculopathy 01/23/2020  . Pre-operative respiratory examination 01/12/2020  . Weakness on right side of face 12/22/2019  . Osteoarthritis of spine with radiculopathy, lumbar region 12/11/2019  . Intractable migraine with aura with status migrainosus 11/09/2019  . Former smoker 08/27/2018  . Benign essential HTN 08/27/2018  . Cough productive of purulent sputum 11/08/2017  . GERD (gastroesophageal reflux disease) 05/06/2017  . Contusion of hand 04/22/2017  . BP (high blood pressure) 04/22/2017  . Oxygen desaturation 04/22/2017  . Prostate lump 04/22/2017  . Unstable angina ([place]) 04/27/2016  . Chest pain 04/24/2016  . Morbid obesity ([place]) 01/29/2016  . Elevated ALT measurement 10/30/2015  . Nonspecific elevation of levels of transaminase and lactic acid dehydrogenase  (ldh) 10/30/2015  . Reduced libido 10/29/2015  . Hyperlipidemia 08/06/2015  . Anxiety 08/06/2015  . Atherosclerosis of coronary artery 08/06/2015  . Childhood asthma 08/06/2015  . Chronic obstructive pulmonary disease ([place]) 08/06/2015  . OSA (obstructive sleep apnea) 08/06/2015  . Anxiety disorder 08/06/2015  . Atherosclerosis of native coronary artery of native heart with stable angina pectoris ([place]) 08/06/2015  . Uncomplicated asthma 46/27/0350   Janna Arch, PT, DPT   05/08/2021, 4:36 PM  [place] MAIN [facility] SERVICES [address] [place], [place], [address] Phone: [phone]   Fax:  [phone]  Name: MAXMILLIAN CARSEY MRN: [id number] Date of Birth: [birth year]

## 2021-05-12 ENCOUNTER — Ambulatory Visit: Payer: BC Managed Care – PPO

## 2021-05-15 ENCOUNTER — Ambulatory Visit: Payer: BC Managed Care – PPO

## 2021-05-19 ENCOUNTER — Ambulatory Visit: Payer: BC Managed Care – PPO

## 2021-05-22 ENCOUNTER — Other Ambulatory Visit: Payer: Self-pay

## 2021-05-22 ENCOUNTER — Ambulatory Visit: Payer: BC Managed Care – PPO

## 2021-05-22 DIAGNOSIS — M6281 Muscle weakness (generalized): Secondary | ICD-10-CM | POA: Diagnosis not present

## 2021-05-22 DIAGNOSIS — R2681 Unsteadiness on feet: Secondary | ICD-10-CM

## 2021-05-22 DIAGNOSIS — R42 Dizziness and giddiness: Secondary | ICD-10-CM

## 2021-05-22 NOTE — Therapy (Signed)
[place] MAIN [facility] SERVICES [address] [place], [place], [address] Phone: [phone]   Fax:  [phone]  Physical Therapy Treatment  Patient Details  Name: Cory Espinoza MRN: [id number] Date of Birth: Oct 26, 1971 Referring Provider (PT): Dr. Joselyn Arrow   Encounter Date: 05/22/2021   PT End of Session - 05/22/21 1534     Visit Number 65    Number of Visits 22    Date for PT Re-Evaluation 05/29/21    Authorization Type [employer] PPO;  4/10 PN 04/10/21    Authorization Time Period 01/09/21-04/03/21    PT Start Time 1515    PT Stop Time 1600    PT Time Calculation (min) 45 min    Equipment Utilized During Treatment Gait belt    Activity Tolerance Patient tolerated treatment well    Behavior During Therapy [facility] for tasks assessed/performed             Past Medical History:  Diagnosis Date   Allergy    Anginal pain ([place])    Anxiety    Arthritis    lumbar spine   Asthma    Atrial fibrillation (HCC)    Back pain    Severe Lumbar Pain   CHF (congestive heart failure) (HCC)    COPD (chronic obstructive pulmonary disease) ([place])    Restrictive lung disease   Coronary artery disease    Dyspnea    Dysrhythmia    afib   GERD (gastroesophageal reflux disease)    Headache    [name], onset October 2020, cluster headaches in the past   History of kidney stones    Hyperlipidemia    Hypertension    Myocardial infarction ([place])    at age 50   Pneumonia    Restrictive lung disease    Sleep apnea    unable to use cpap since onset of migraines    Past Surgical History:  Procedure Laterality Date   ABDOMINAL EXPOSURE N/A 03/25/2020   Procedure: ABDOMINAL EXPOSURE;  Surgeon: Rosetta Posner, MD;  Location: [facility] OR;  Service: Vascular;  Laterality: N/A;   ANTERIOR CERVICAL DECOMP/DISCECTOMY FUSION N/A 01/23/2020   Procedure: ANTERIOR CERVICAL DECOMPRESSION FUSION - CERVICAL SIX-CERVICAL SEVEN;  Surgeon: Earnie Larsson, MD;  Location: [place];   Service: Neurosurgery;  Laterality: N/A;   ANTERIOR LUMBAR FUSION N/A 03/25/2020   Procedure: ANTERIOR LUMBAR INTERBODY FUSION LUMBAR FIVE-SACRAL ONE.;  Surgeon: Earnie Larsson, MD;  Location: [place];  Service: Neurosurgery;  Laterality: N/A;  anterior   BACK SURGERY  1996   CARDIAC CATHETERIZATION Left 04/27/2016   Procedure: Left Heart Cath and Coronary Angiography;  Surgeon: Corey Skains, MD;  Location: [place] CV LAB;  Service: Cardiovascular;  Laterality: Left;   CARDIAC CATHETERIZATION N/A 04/27/2016   Procedure: Intravascular Pressure Wire/FFR Study;  Surgeon: Yolonda Kida, MD;  Location: [place] CV LAB;  Service: Cardiovascular;  Laterality: N/A;   CATHETERIZATION OF PULMONARY ARTERY WITH RETRIEVAL OF FOREIGN BODY Bilateral 04/07/2011   heart   DEGENERATIVE SPONDYLOLISTHESIS     KNEE ARTHROSCOPY Bilateral 2004   LUMBAR DISC SURGERY  1999   RADICULOPATHY, CERVICAL REGION     TONSILLECTOMY      There were no vitals filed for this visit.   Subjective Assessment - 05/22/21 2117     Subjective ?Patient had issues with medication causing "jello legs" and other issues. Has fallen a few times last week and one close call this week.    Pertinent History  Pt has a complex medical history. 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 He states that he fell 10-12 feet and struck his head, neck, and back. He was able to finish the course which took him approximately 45 minutes more to complete. He does endorse a second fall while finishing the course but did not suffer a second head injury. He states that he started getting headaches that day which have persisted since that time. 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 He does report improvement in his headaches recently. Neurology was concerned about possible BPPV and have referred him for a vestibular evaluation. In addition  since the injury, pt developed RUE and RLE pain and weakness. Pt states that NCV showed abnormal nerve conduction in RUE. He has since undergone a C7-7 ACDF on 01/23/20. He reports initial improvement in RUE strength, but states that he has a gradual return of his RUE weakness. He was also having significant RLE weakness and pain and underwent and L5-S1 anterior lumbar interbody fusion on 03/25/20. Patient reports that he does not have any back precautions but needs to wear the low back brace for one more month. Patient reports he has a follow-up appointment with the surgeon next month. Patient reports he has a follow-up appointment with Dr. Brigitte Pulse, neurologist, in August. He arrives to therapy ambulating with an upright rollator walker. Pt denies any mention of concussion after his injury. He has been having cognitive issues since his injury and has previously had a heavy metals screen as well as neurocognitive testing. He has had an MRI of his brain with and without contrast on 12/18/19. Results were mildly motion degraded examination. No evidence of acute intracranial abnormality. Minimal chronic small vessel ischemic disease. He has tremor in both his hands. He has a positive family history of Parkinson's disease in his grandfather. He is complaining of constant dizziness and unsteadiness which are worse with activity. Patient states that he frequently loses his balance and his wife has to steady him.    Limitations Lifting;Standing;Walking;House hold activities    Diagnostic tests He has had an MRI of his brain with and without contrast on 12/18/19. Results were mildly motion degraded examination. No evidence of acute intracranial abnormality. Minimal chronic small vessel ischemic disease.    Patient Stated Goals to be able to ambulate with a cane or no AD, to be able to drive, to be able to ride his motorcycle    Currently in Pain? Yes    Pain Score 2     Pain Location Head    Pain Descriptors / Indicators  Headache    Pain Type Chronic pain    Pain Onset More than a month ago    Pain Frequency Constant                [facility] PT Assessment - 05/22/21 0001       Standardized Balance Assessment   Standardized Balance Assessment Berg Balance Test      Berg Balance Test   Sit to Stand Able to stand without using hands and stabilize independently    Standing Unsupported Able to stand safely 2 minutes    Sitting with Back Unsupported but Feet Supported on Floor or Stool Able to sit safely and securely 2 minutes    Stand to Sit Sits safely with minimal use of hands    Transfers  Able to transfer safely, minor use of hands    Standing Unsupported with Eyes Closed Able to stand 10 seconds with supervision    Standing Unsupported with Feet Together Able to place feet together independently and stand 1 minute safely    From Standing, Reach Forward with Outstretched Arm Can reach confidently >25 cm (10")    From Standing Position, Pick up Object from Floor Able to pick up shoe, needs supervision    From Standing Position, Turn to Look Behind Over each Shoulder Looks behind from both sides and weight shifts well    Turn 360 Degrees Needs close supervision or verbal cueing    Standing Unsupported, Alternately Place Feet on Step/Stool Able to stand independently and safely and complete 8 steps in 20 seconds    Standing Unsupported, One Foot in Front Able to take small step independently and hold 30 seconds    Standing on One Leg Tries to lift leg/unable to hold 3 seconds but remains standing independently    Total Score 46              Patient had issues with medication causing "jello legs" and other issues. Has fallen a few times last week and one close call this week.   BERG 46/56  in // bars: Step over orange hurdle and back SUE support 8x each LE, BUE support  airex balance beam ; lateral stepping finger tip support 6x length of // bars, excessive ankle righting reactions and tremors  throughout body   3lb ankle weight: -marching forward length of // bars with backwards stepping 4x -lateral stepping 4x length of // bars   Seated: 3lb ankle weight LAQ 10x each LE  STM to upper trap and levator scap for reduction of tension and migraine relief x 8 minutes     ambulate 290 ft with walking stick; migraine progressing resulting in drift, frequent near LOB requiring mod A to stabilize patient. Able to stabilize against wall of elevator to get outside.   Car transfer: requires mod A for RLE to bring into car.        Pt educated throughout session about proper posture and technique with exercises. Improved exercise technique, movement at target joints, use of target muscles after min to mod verbal, visual, tactile cues     BERG 46/56   Patient initially presented to physical therapy feeling well. Was able to perform BERG and scored an increase to 46/56. Halfway through session performance deteriorates quickly due to changing weather resulting in increased migraine and instability. The patient continues to benefit from additional skilled PT services to improve LE strength and balance for decreased fall risk and improved quality of life           PT Education - 05/22/21 2118     Education Details exercise technique body mechanics    Person(s) Educated Patient    Methods Explanation;Demonstration;Tactile cues;Verbal cues    Comprehension Verbalized understanding;Returned demonstration;Verbal cues required;Tactile cues required              PT Short Term Goals - 03/06/21 1601       PT SHORT TERM GOAL #1   Title Patient will be independent in home exercise program to improve strength/mobility for better functional independence with ADLs and for self-management.    Baseline 3/3 HEP compliant 3/31 hep compliant    Time 6    Period Weeks    Status Achieved    Target Date 02/20/21  PT SHORT TERM GOAL #2   Title Patient will be modified independent in  walking on even/uneven surface with least restrictive assistive device, for 10+ minutes without rest break, reporting some difficulty or less to improve walking tolerance with community ambulation including grocery shopping, going to church,etc.    Baseline 02/03: instability, LOB multiple times while ambulating to elevator 3/31: unable to test due to throwing up    Time 6    Period Weeks    Status On-going    Target Date 04/17/21      PT SHORT TERM GOAL #3   Title Patient will increase ABC scale score >80% to demonstrate better functional mobility and better confidence with ADLs.    Baseline 01/09/21: 60.625% 3/3/: 50% 3/31: 63%    Time 6    Period Weeks    Status Partially Met    Target Date 04/17/21               PT Long Term Goals - 03/06/21 1826       PT LONG TERM GOAL #1   Title Patient will increase FOTO score to equal to or greater than 65 to demonstrate statistically significant improvement in mobility and quality of life.    Baseline scored 41/100 on 05/21/2020; 7/22 40, 8/23: 60/100, 09/30/20=40, 01/09/21: 46 3/3: 40.3% 3/31: 49%    Time 12    Period Weeks    Status Partially Met    Target Date 05/29/21      PT LONG TERM GOAL #2   Title Patient will reduce falls risk as indicated by decreased TUG time to less than 11 seconds.    Baseline scored 37.9 sec with TUG on 05/21/2020; 21seconds with elevated rollator 7/22, 8/23: 13.62 sec with up and go walker, 01/09/21: 15.81s with hiking stick 3/3: 17.98 seconds one near LOB 3/31: 15.57 seconds with walking stick    Time 12    Period Weeks    Status Partially Met    Target Date 05/29/21      PT LONG TERM GOAL #3   Title Patient will increase right UE and LE gross strength to 4+/5 throughout to improve functional strength for independent gait, increased standing tolerance and increased ADL ability.    Baseline grossly +3/5 to -4/5 right UE and LE strength on 05/21/2020; grossly 4-/5 for RUE, grossly 4-/5 for RLE on 7/22,  grossly 4-/5 RUE, grossly 4/5 RLE 3/3: see note    Time 8    Period Weeks    Status Partially Met    Target Date 05/29/21      PT LONG TERM GOAL #4   Title Patient will increase 10 meter walk test to >1.33ms as to improve gait speed for better community ambulation and to reduce fall risk.    Baseline on 7/22 .57 m/sself selected, fast  .73 m/s with LOB pt reported R knee buckling, elevated rollator used, 8/23: 1.06 m/s with up and go walker, 01/09/21: 0.72 m/s with hiking stick 3/3: 0.74 m/s with walking stick 3/31: 0.81 m/s w walking stick    Time 12    Period Weeks    Status Partially Met    Target Date 05/29/21      PT LONG TERM GOAL #5   Title Patient will increase Berg Balance score by > 6 points to demonstrate decreased fall risk during functional activities.    Baseline 8/23: 37/56, 09/30/20=40/56, 12/20: 42/56 3/3: deferred 3/31 will perform next time; deferred due to throwing up  Time 8    Period Weeks    Status Deferred    Target Date 05/29/21      PT LONG TERM GOAL #6   Title Patient (< 76 years old) will complete five times sit to stand test in < 10 seconds indicating an increased LE strength and improved balance.    Baseline 8/23: 22 sec, 09/30/20=13.04 sec, 12/20: 27.05 sec, 01/09/21: 18.01s 3/3: 23 seconds no UE support 3/31; deferred due to patient throwing up    Time 12    Period Weeks    Status Partially Met    Target Date 05/29/21      PT LONG TERM GOAL #7   Title Patient will improve 6 min walk test >1000 feet with LRAD for improved gait ability in community.    Baseline 8/23: not tested; 8/30 845, goal adjusted >2024f, 09/30/20=600 feet - stopped test due to R ankle catching making gait unsafe, 11/25/20= 330 feet - stopped test due to severe dizziness, vomiting/nausea, sweating making gait unsafe; 01/09/21: 720 ft with no rest breaks 3/3: deferred due to nausea 3/31: deferred due to patient throwing up    Time 12    Period Weeks    Status Deferred    Target  Date 05/29/21                   Plan - 05/22/21 2120     Clinical Impression Statement Patient initially presented to physical therapy feeling well. Was able to perform BERG and scored an increase to 46/56. Halfway through session performance deteriorates quickly due to changing weather resulting in increased migraine and instability. The patient continues to benefit from additional skilled PT services to improve LE strength and balance for decreased fall risk and improved quality of life    Personal Factors and Comorbidities Comorbidity 3+;Time since onset of injury/illness/exacerbation    Comorbidities anxiety, back pain, COPD, headache, HTN, MI, sleep apnea, migraines    Examination-Activity Limitations Locomotion Level;Transfers;Bed Mobility;Stand;Stairs;Sleep;Squat;Bend    Examination-Participation Restrictions Driving;Medication Management    Stability/Clinical Decision Making Unstable/Unpredictable    Rehab Potential Fair    PT Frequency 2x / week    PT Duration 12 weeks    PT Treatment/Interventions ADLs/Self Care Home Management;Canalith Repostioning;Moist Heat;Electrical Stimulation;Gait training;Stair training;Functional mobility training;Therapeutic activities;Therapeutic exercise;Balance training;Neuromuscular re-education;Patient/family education;Manual techniques;Passive range of motion;Vestibular    PT Next Visit Plan update HEP ea session for continued strengthening/balanceing at home    [place]: [email or web address] updated this session    Consulted and Agree with Plan of Care Patient;Other (Comment)   Friend            Patient will benefit from skilled therapeutic intervention in order to improve the following deficits and impairments:  Decreased activity tolerance, Decreased balance, Decreased cognition, Decreased mobility, Difficulty walking, Abnormal gait, Decreased range of motion, Dizziness, Pain, Impaired flexibility, Decreased  strength, Impaired sensation, Decreased endurance, Decreased safety awareness  Visit Diagnosis: Muscle weakness (generalized)  Dizziness and giddiness  Unsteadiness on feet     Problem List Patient Active Problem List   Diagnosis Date Noted   Degenerative spondylolisthesis 03/25/2020   Physical deconditioning 03/04/2020   Lobar pneumonia, unspecified organism ([place] 03/04/2020   Intractable hemiplegic migraine with status migrainosus 03/02/2020   Current tobacco use 02/05/2020   Abnormal findings on diagnostic imaging of lung 02/05/2020   Shortness of breath 02/05/2020   Herniated disc, cervical 01/23/2020   Cervical spondylosis with myelopathy and radiculopathy 01/23/2020   Pre-operative  respiratory examination 01/12/2020   Weakness on right side of face 12/22/2019   Osteoarthritis of spine with radiculopathy, lumbar region 12/11/2019   Intractable migraine with aura with status migrainosus 11/09/2019   Former smoker 08/27/2018   Benign essential HTN 08/27/2018   Cough productive of purulent sputum 11/08/2017   GERD (gastroesophageal reflux disease) 05/06/2017   Contusion of hand 04/22/2017   BP (high blood pressure) 04/22/2017   Oxygen desaturation 04/22/2017   Prostate lump 04/22/2017   Unstable angina ([place]) 04/27/2016   Chest pain 04/24/2016   Morbid obesity ([place]) 01/29/2016   Elevated ALT measurement 10/30/2015   Nonspecific elevation of levels of transaminase and lactic acid dehydrogenase (ldh) 10/30/2015   Reduced libido 10/29/2015   Hyperlipidemia 08/06/2015   Anxiety 08/06/2015   Atherosclerosis of coronary artery 08/06/2015   Childhood asthma 08/06/2015   Chronic obstructive pulmonary disease ([place]) 08/06/2015   OSA (obstructive sleep apnea) 08/06/2015   Anxiety disorder 08/06/2015   Atherosclerosis of native coronary artery of native heart with stable angina pectoris ([place]) 62/13/0865   Uncomplicated asthma 78/46/9629    Janna Arch, PT, DPT  05/22/2021,  9:23 PM  [place] MAIN [facility] SERVICES [address] [place], [place], [address] Phone: [phone]   Fax:  [phone]  Name: BERWYN BIGLEY MRN: [id number] Date of Birth: 1971-01-25

## 2021-05-26 ENCOUNTER — Ambulatory Visit: Payer: BC Managed Care – PPO

## 2021-05-26 ENCOUNTER — Other Ambulatory Visit: Payer: Self-pay

## 2021-05-26 DIAGNOSIS — R2681 Unsteadiness on feet: Secondary | ICD-10-CM

## 2021-05-26 DIAGNOSIS — R42 Dizziness and giddiness: Secondary | ICD-10-CM

## 2021-05-26 DIAGNOSIS — M6281 Muscle weakness (generalized): Secondary | ICD-10-CM

## 2021-05-26 NOTE — Therapy (Signed)
[facility] MAIN [facility] SERVICES [place], [place], [address] Phone: [phone]   Fax:  [phone]  Physical Therapy Treatment/RECERT  Patient Details  Name: Cory Espinoza MRN: [id number] Date of Birth: 02/11/1971 Referring Provider (PT): Dr. Joselyn Arrow   Encounter Date: 05/26/2021   PT End of Session - 05/26/21 1611     Visit Number 50    Number of Visits 99    Date for PT Re-Evaluation 08/18/21    Authorization Type [employer]BS PPO;  5/10 PN 04/10/21    Authorization Time Period 01/09/21-04/03/21    PT Start Time 1515    PT Stop Time 1559    PT Time Calculation (min) 44 min    Equipment Utilized During Treatment Gait belt    Activity Tolerance Patient tolerated treatment well    Behavior During Therapy [facility] for tasks assessed/performed             Past Medical History:  Diagnosis Date   Allergy    Anginal pain (HCC)    Anxiety    Arthritis    lumbar spine   Asthma    Atrial fibrillation (HCC)    Back pain    Severe Lumbar Pain   CHF (congestive heart failure) (HCC)    COPD (chronic obstructive pulmonary disease) (HCC)    Restrictive lung disease   Coronary artery disease    Dyspnea    Dysrhythmia    afib   GERD (gastroesophageal reflux disease)    Headache    [name], onset October 2020, cluster headaches in the past   History of kidney stones    Hyperlipidemia    Hypertension    Myocardial infarction ([place])    at age 75   Pneumonia    Restrictive lung disease    Sleep apnea    unable to use cpap since onset of migraines    Past Surgical History:  Procedure Laterality Date   ABDOMINAL EXPOSURE N/A 03/25/2020   Procedure: ABDOMINAL EXPOSURE;  Surgeon: Rosetta Posner, MD;  Location: [facility] OR;  Service: Vascular;  Laterality: N/A;   ANTERIOR CERVICAL DECOMP/DISCECTOMY FUSION N/A 01/23/2020   Procedure: ANTERIOR CERVICAL DECOMPRESSION FUSION - CERVICAL SIX-CERVICAL SEVEN;  Surgeon: Earnie Larsson, MD;  Location: [place];  Service: Neurosurgery;  Laterality: N/A;   ANTERIOR LUMBAR FUSION N/A 03/25/2020   Procedure: ANTERIOR LUMBAR INTERBODY FUSION LUMBAR FIVE-SACRAL ONE.;  Surgeon: Earnie Larsson, MD;  Location: [place];  Service: Neurosurgery;  Laterality: N/A;  anterior   BACK SURGERY  1996   CARDIAC CATHETERIZATION Left 04/27/2016   Procedure: Left Heart Cath and Coronary Angiography;  Surgeon: Corey Skains, MD;  Location: [place] [facility];  Service: Cardiovascular;  Laterality: Left;   CARDIAC CATHETERIZATION N/A 04/27/2016   Procedure: Intravascular Pressure Wire/FFR Study;  Surgeon: Yolonda Kida, MD;  Location: [place] [facility];  Service: Cardiovascular;  Laterality: N/A;   CATHETERIZATION OF PULMONARY ARTERY WITH RETRIEVAL OF FOREIGN BODY Bilateral 04/07/2011   heart   DEGENERATIVE SPONDYLOLISTHESIS     KNEE ARTHROSCOPY Bilateral 2004   LUMBAR DISC SURGERY  1999   RADICULOPATHY, CERVICAL REGION     TONSILLECTOMY      There were no vitals filed for this visit.   Subjective Assessment - 05/26/21 1608     Subjective Patient presents with wife. Reports he had a fall yesterday, is not wearing his brace at home. Has been compliant with his HEP    Pertinent History Pt  has a complex medical history. 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 He states that he fell 10-12 feet and struck his head, neck, and back. He was able to finish the course which took him approximately 45 minutes more to complete. He does endorse a second fall while finishing the course but did not suffer a second head injury. He states that he started getting headaches that day which have persisted since that time. 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 He does report improvement in his headaches recently. Neurology was concerned about possible BPPV and have referred him for a vestibular evaluation. In addition since  the injury, pt developed RUE and RLE pain and weakness. Pt states that NCV showed abnormal nerve conduction in RUE. He has since undergone a C7-7 ACDF on 01/23/20. He reports initial improvement in RUE strength, but states that he has a gradual return of his RUE weakness. He was also having significant RLE weakness and pain and underwent and L5-S1 anterior lumbar interbody fusion on 03/25/20. Patient reports that he does not have any back precautions but needs to wear the low back brace for one more month. Patient reports he has a follow-up appointment with the surgeon next month. Patient reports he has a follow-up appointment with Dr. Brigitte Pulse, neurologist, in August. He arrives to therapy ambulating with an upright rollator walker. Pt denies any mention of concussion after his injury. He has been having cognitive issues since his injury and has previously had a heavy metals screen as well as neurocognitive testing. He has had an MRI of his brain with and without contrast on 12/18/19. Results were mildly motion degraded examination. No evidence of acute intracranial abnormality. Minimal chronic small vessel ischemic disease. He has tremor in both his hands. He has a positive family history of Parkinson's disease in his grandfather. He is complaining of constant dizziness and unsteadiness which are worse with activity. Patient states that he frequently loses his balance and his wife has to steady him.    Limitations Lifting;Standing;Walking;House hold activities    Diagnostic tests He has had an MRI of his brain with and without contrast on 12/18/19. Results were mildly motion degraded examination. No evidence of acute intracranial abnormality. Minimal chronic small vessel ischemic disease.    Patient Stated Goals to be able to ambulate with a cane or no AD, to be able to drive, to be able to ride his motorcycle    Currently in Pain? Yes    Pain Score 2     Pain Location Head    Pain Orientation Mid    Pain  Descriptors / Indicators Headache    Pain Type Chronic pain    Pain Onset More than a month ago    Pain Frequency Constant                  Goals:  Abc: 45%  FOTO: 45%  TUG: 14.8 seconds with walking stick Strength 10 MWT: 9.8 seconds.with walking stick   BERG-46/56 6 min walk test 640; PT stopped patient at 1.4 minutes left due to excessive nausea.   Treatment:   STM to upper trap and levator scap for reduction of tension and migraine relief x 8 minutes    9lb dumbbell seated core intervention: (added to HEP ) -hold overhead and march 10x each LE -hold overhead with LAQ 5x each LE -  chest press 10x -straight arm raise 10x      Patient has made progress with TUG, met 10 MWT and BERG, will progress BERG goal to achieve a more functional balance. He is progressing with his overall mobility and stability however visual challenges and migraines continue to be limiting factors. He continues to demonstrate excellent motivation and progression. Soft tissue work on cervical spine reduces dizziness and nausea. The patient continues to benefit from additional skilled PT services to improve LE strength and balance for decreased fall risk and improved quality of life   PT Education - 05/26/21 1610     Education Details goals, POC    Person(s) Educated Patient    Methods Explanation;Demonstration;Tactile cues;Verbal cues    Comprehension Verbalized understanding;Returned demonstration;Verbal cues required;Tactile cues required              PT Short Term Goals - 05/26/21 1614       PT SHORT TERM GOAL #1   Title Patient will be independent in home exercise program to improve strength/mobility for better functional independence with ADLs and for self-management.    Baseline 3/3 HEP compliant 3/31 hep compliant    Time 6    Period Weeks    Status Achieved    Target Date 02/20/21      PT SHORT TERM GOAL #2   Title Patient will be modified independent in walking on  even/uneven surface with least restrictive assistive device, for 10+ minutes without rest break, reporting some difficulty or less to improve walking tolerance with community ambulation including grocery shopping, going to church,etc.    Baseline 02/03: instability, LOB multiple times while ambulating to elevator 3/31: unable to test due to throwing up 6/20: 6 minutes    Time 6    Period Weeks    Status Partially Met    Target Date 07/07/21      PT SHORT TERM GOAL #3   Title Patient will increase ABC scale score >80% to demonstrate better functional mobility and better confidence with ADLs.    Baseline 01/09/21: 60.625% 3/3/: 50% 3/31: 63% 6/20: 45%    Time 6    Period Weeks    Status On-going    Target Date 07/07/21               PT Long Term Goals - 05/26/21 1603       PT LONG TERM GOAL #1   Title Patient will increase FOTO score to equal to or greater than 65 to demonstrate statistically significant improvement in mobility and quality of life.    Baseline scored 41/100 on 05/21/2020; 7/22 40, 8/23: 60/100, 09/30/20=40, 01/09/21: 46 3/3: 40.3% 3/31: 49% 6/20: 45%    Time 12    Period Weeks    Status Partially Met    Target Date 08/18/21      PT LONG TERM GOAL #2   Title Patient will reduce falls risk as indicated by decreased TUG time to less than 11 seconds.    Baseline scored 37.9 sec with TUG on 05/21/2020; 21seconds with elevated rollator 7/22, 8/23: 13.62 sec with up and go walker, 01/09/21: 15.81s with hiking stick 3/3: 17.98 seconds one near LOB 3/31: 15.57 seconds with walking stick 6/20: 14.8 seconds with walking stick    Time 12    Period Weeks    Status Partially Met    Target Date 08/18/21      PT LONG TERM GOAL #3   Title Patient will increase right UE and LE  gross strength to 4+/5 throughout to improve functional strength for independent gait, increased standing tolerance and increased ADL ability.    Baseline grossly +3/5 to -4/5 right UE and LE strength on  05/21/2020; grossly 4-/5 for RUE, grossly 4-/5 for RLE on 7/22, grossly 4-/5 RUE, grossly 4/5 RLE 3/3: see note    Time 8    Period Weeks    Status Partially Met    Target Date 08/18/21      PT LONG TERM GOAL #4   Title Patient will increase 10 meter walk test to >1.31ms as to improve gait speed for better community ambulation and to reduce fall risk.    Baseline on 7/22 .57 m/sself selected, fast  .73 m/s with LOB pt reported R knee buckling, elevated rollator used, 8/23: 1.06 m/s with up and go walker, 01/09/21: 0.72 m/s with hiking stick 3/3: 0.74 m/s with walking stick 3/31: 0.81 m/s w walking stick 6/20: 1.1 m/s with walking stick    Time 12    Period Weeks    Status Achieved      PT LONG TERM GOAL #5   Title Patient will increase Berg Balance score by > 6 points to demonstrate decreased fall risk during functional activities.    Baseline 8/23: 37/56, 09/30/20=40/56, 12/20: 42/56 3/3: deferred 3/31 will perform next time; deferred due to throwing up 6/20: 46/56    Time 8    Period Weeks    Status Achieved      Additional Long Term Goals   Additional Long Term Goals Yes      PT LONG TERM GOAL #6   Title Patient (< 679years old) will complete five times sit to stand test in < 10 seconds indicating an increased LE strength and improved balance.    Baseline 8/23: 22 sec, 09/30/20=13.04 sec, 12/20: 27.05 sec, 01/09/21: 18.01s 3/3: 23 seconds no UE support 3/31; deferred due to patient throwing up    Time 12    Period Weeks    Status Partially Met    Target Date 08/18/21      PT LONG TERM GOAL #7   Title Patient will improve 6 min walk test >1000 feet with LRAD for improved gait ability in community.    Baseline 8/23: not tested; 8/30 845, goal adjusted >20010f 09/30/20=600 feet - stopped test due to R ankle catching making gait unsafe, 11/25/20= 330 feet - stopped test due to severe dizziness, vomiting/nausea, sweating making gait unsafe; 01/09/21: 720 ft with no rest breaks 3/3:  deferred due to nausea 3/31: deferred due to patient throwing up 6/20: 640 ft    Time 12    Period Weeks    Status Partially Met    Target Date 08/18/21      PT LONG TERM GOAL #8   Title Patient will increase Berg Balance score by > 6 points  (52/56) to demonstrate decreased fall risk during functional activities.    Baseline 6/20: 46/56    Time 12    Period Weeks    Status New    Target Date 08/18/21                   Plan - 05/26/21 1614     Clinical Impression Statement Patient has made progress with TUG, met 10 MWT and BERG, will progress BERG goal to achieve a more functional balance. He is progressing with his overall mobility and stability however visual challenges and migraines continue to be limiting factors. He continues  to demonstrate excellent motivation and progression. Soft tissue work on cervical spine reduces dizziness and nausea. The patient continues to benefit from additional skilled PT services to improve LE strength and balance for decreased fall risk and improved quality of life    Personal Factors and Comorbidities Comorbidity 3+;Time since onset of injury/illness/exacerbation    Comorbidities anxiety, back pain, COPD, headache, HTN, MI, sleep apnea, migraines    Examination-Activity Limitations Locomotion Level;Transfers;Bed Mobility;Stand;Stairs;Sleep;Squat;Bend    Examination-Participation Restrictions Driving;Medication Management    Stability/Clinical Decision Making Unstable/Unpredictable    Rehab Potential Fair    PT Frequency 2x / week    PT Duration 12 weeks    PT Treatment/Interventions ADLs/Self Care Home Management;Canalith Repostioning;Moist Heat;Electrical Stimulation;Gait training;Stair training;Functional mobility training;Therapeutic activities;Therapeutic exercise;Balance training;Neuromuscular re-education;Patient/family education;Manual techniques;Passive range of motion;Vestibular    PT Next Visit Plan update HEP ea session for  continued strengthening/balanceing at home    PT [place] Access Code: [email or web address] updated this session    Consulted and Agree with Plan of Care Patient;Other (Comment)   Friend            Patient will benefit from skilled therapeutic intervention in order to improve the following deficits and impairments:  Decreased activity tolerance, Decreased balance, Decreased cognition, Decreased mobility, Difficulty walking, Abnormal gait, Decreased range of motion, Dizziness, Pain, Impaired flexibility, Decreased strength, Impaired sensation, Decreased endurance, Decreased safety awareness  Visit Diagnosis: Muscle weakness (generalized)  Unsteadiness on feet  Dizziness and giddiness     Problem List Patient Active Problem List   Diagnosis Date Noted   Degenerative spondylolisthesis 03/25/2020   Physical deconditioning 03/04/2020   Lobar pneumonia, unspecified organism ([place]) 03/04/2020   Intractable hemiplegic migraine with status migrainosus 03/02/2020   Current tobacco use 02/05/2020   Abnormal findings on diagnostic imaging of lung 02/05/2020   Shortness of breath 02/05/2020   Herniated disc, cervical 01/23/2020   Cervical spondylosis with myelopathy and radiculopathy 01/23/2020   Pre-operative respiratory examination 01/12/2020   Weakness on right side of face 12/22/2019   Osteoarthritis of spine with radiculopathy, lumbar region 12/11/2019   Intractable migraine with aura with status migrainosus 11/09/2019   Former smoker 08/27/2018   Benign essential HTN 08/27/2018   Cough productive of purulent sputum 11/08/2017   GERD (gastroesophageal reflux disease) 05/06/2017   Contusion of hand 04/22/2017   BP (high blood pressure) 04/22/2017   Oxygen desaturation 04/22/2017   Prostate lump 04/22/2017   Unstable angina ([place]) 04/27/2016   Chest pain 04/24/2016   Morbid obesity ([place]) 01/29/2016   Elevated ALT measurement 10/30/2015   Nonspecific elevation of levels  of transaminase and lactic acid dehydrogenase (ldh) 10/30/2015   Reduced libido 10/29/2015   Hyperlipidemia 08/06/2015   Anxiety 08/06/2015   Atherosclerosis of coronary artery 08/06/2015   Childhood asthma 08/06/2015   Chronic obstructive pulmonary disease ([place]) 08/06/2015   OSA (obstructive sleep apnea) 08/06/2015   Anxiety disorder 08/06/2015   Atherosclerosis of native coronary artery of native heart with stable angina pectoris ([place]) 81/19/1478   Uncomplicated asthma 29/56/2130    Janna Arch, PT, DPT  05/26/2021, 4:17 PM  [place] MAIN [facility] SERVICES [address] [place], [place], [address] Phone: [phone]   Fax:  [phone]  Name: HAVISH PETTIES MRN: [id number] Date of Birth: January 20, 1971

## 2021-05-29 ENCOUNTER — Ambulatory Visit: Payer: BC Managed Care – PPO

## 2021-05-29 ENCOUNTER — Other Ambulatory Visit: Payer: Self-pay

## 2021-05-29 DIAGNOSIS — R2681 Unsteadiness on feet: Secondary | ICD-10-CM

## 2021-05-29 DIAGNOSIS — M6281 Muscle weakness (generalized): Secondary | ICD-10-CM | POA: Diagnosis not present

## 2021-05-29 DIAGNOSIS — R42 Dizziness and giddiness: Secondary | ICD-10-CM

## 2021-05-29 NOTE — Therapy (Signed)
[place] MAIN [facility] SERVICES [address] [place], [place], [address] Phone: [phone]   Fax:  [phone]  Physical Therapy Treatment  Patient Details  Name: Cory Espinoza MRN: [id number] Date of Birth: 08/23/1971 Referring Provider (PT): Dr. Joselyn Arrow   Encounter Date: 05/29/2021   PT End of Session - 05/29/21 2148     Visit Number 71    Number of Visits 67    Date for PT Re-Evaluation 08/18/21    Authorization Type [employer] PPO;  6/10 PN 04/10/21    Authorization Time Period 01/09/21-04/03/21    PT Start Time 1515    PT Stop Time 1559    PT Time Calculation (min) 44 min    Equipment Utilized During Treatment Gait belt    Activity Tolerance Patient tolerated treatment well    Behavior During Therapy [facility] for tasks assessed/performed             Past Medical History:  Diagnosis Date   Allergy    Anginal pain (HCC)    Anxiety    Arthritis    lumbar spine   Asthma    Atrial fibrillation (HCC)    Back pain    Severe Lumbar Pain   CHF (congestive heart failure) (HCC)    COPD (chronic obstructive pulmonary disease) (HCC)    Restrictive lung disease   Coronary artery disease    Dyspnea    Dysrhythmia    afib   GERD (gastroesophageal reflux disease)    Headache    [name], onset October 2020, cluster headaches in the past   History of kidney stones    Hyperlipidemia    Hypertension    Myocardial infarction ([place])    at age 55   Pneumonia    Restrictive lung disease    Sleep apnea    unable to use cpap since onset of migraines    Past Surgical History:  Procedure Laterality Date   ABDOMINAL EXPOSURE N/A 03/25/2020   Procedure: ABDOMINAL EXPOSURE;  Surgeon: Rosetta Posner, MD;  Location: [facility] OR;  Service: Vascular;  Laterality: N/A;   ANTERIOR CERVICAL DECOMP/DISCECTOMY FUSION N/A 01/23/2020   Procedure: ANTERIOR CERVICAL DECOMPRESSION FUSION - CERVICAL SIX-CERVICAL SEVEN;  Surgeon: Earnie Larsson, MD;  Location: [place];   Service: Neurosurgery;  Laterality: N/A;   ANTERIOR LUMBAR FUSION N/A 03/25/2020   Procedure: ANTERIOR LUMBAR INTERBODY FUSION LUMBAR FIVE-SACRAL ONE.;  Surgeon: Earnie Larsson, MD;  Location: [place];  Service: Neurosurgery;  Laterality: N/A;  anterior   BACK SURGERY  1996   CARDIAC CATHETERIZATION Left 04/27/2016   Procedure: Left Heart Cath and Coronary Angiography;  Surgeon: Corey Skains, MD;  Location: [place] CV LAB;  Service: Cardiovascular;  Laterality: Left;   CARDIAC CATHETERIZATION N/A 04/27/2016   Procedure: Intravascular Pressure Wire/FFR Study;  Surgeon: Yolonda Kida, MD;  Location: [place] CV LAB;  Service: Cardiovascular;  Laterality: N/A;   CATHETERIZATION OF PULMONARY ARTERY WITH RETRIEVAL OF FOREIGN BODY Bilateral 04/07/2011   heart   DEGENERATIVE SPONDYLOLISTHESIS     KNEE ARTHROSCOPY Bilateral 2004   LUMBAR DISC SURGERY  1999   RADICULOPATHY, CERVICAL REGION     TONSILLECTOMY      There were no vitals filed for this visit.   Subjective Assessment - 05/29/21 2147     Subjective Patient reports no falls since last session. Has been compliant with HEP, did his exercises earlier today.    Pertinent History Pt has a complex medical history. 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 He states that he fell 10-12 feet and struck his head, neck, and back. He was able to finish the course which took him approximately 45 minutes more to complete. He does endorse a second fall while finishing the course but did not suffer a second head injury. He states that he started getting headaches that day which have persisted since that time. 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 He does report improvement in his headaches recently. Neurology was concerned about possible BPPV and have referred him for a vestibular evaluation. In addition since the injury, pt developed  RUE and RLE pain and weakness. Pt states that NCV showed abnormal nerve conduction in RUE. He has since undergone a C7-7 ACDF on 01/23/20. He reports initial improvement in RUE strength, but states that he has a gradual return of his RUE weakness. He was also having significant RLE weakness and pain and underwent and L5-S1 anterior lumbar interbody fusion on 03/25/20. Patient reports that he does not have any back precautions but needs to wear the low back brace for one more month. Patient reports he has a follow-up appointment with the surgeon next month. Patient reports he has a follow-up appointment with Dr. Brigitte Pulse, neurologist, in August. He arrives to therapy ambulating with an upright rollator walker. Pt denies any mention of concussion after his injury. He has been having cognitive issues since his injury and has previously had a heavy metals screen as well as neurocognitive testing. He has had an MRI of his brain with and without contrast on 12/18/19. Results were mildly motion degraded examination. No evidence of acute intracranial abnormality. Minimal chronic small vessel ischemic disease. He has tremor in both his hands. He has a positive family history of Parkinson's disease in his grandfather. He is complaining of constant dizziness and unsteadiness which are worse with activity. Patient states that he frequently loses his balance and his wife has to steady him.    Limitations Lifting;Standing;Walking;House hold activities    Diagnostic tests He has had an MRI of his brain with and without contrast on 12/18/19. Results were mildly motion degraded examination. No evidence of acute intracranial abnormality. Minimal chronic small vessel ischemic disease.    Patient Stated Goals to be able to ambulate with a cane or no AD, to be able to drive, to be able to ride his motorcycle    Currently in Pain? Yes    Pain Score 4     Pain Location Head    Pain Orientation Mid    Pain Descriptors / Indicators Headache     Pain Type Chronic pain    Pain Onset More than a month ago    Pain Frequency Constant               in // bars: -lateral stepping 4x length of // bars with CGA and BUE support -forward stepping no UE support, backwards ambulation with BUE support 4x length of // bars 6" step toe taps 10x each LE   Standing with # 3lb ankle weight: CGA for stability  -Hip extension with bilateral upper extremity support, cueing for neutral hip alignment, upright posture for optimal muscle recruitment, and sequencing, 10x each LE,  -Hip abduction with bilateral upper extremity support, cueing for neutral foot alignment for correct muscle activation, 10x each LE -Hip flexion with bilateral upper  extremity support, cueing for body mechanics, speed of muscle recruitment for optimal strengthening and stabilization 10x each LE -Hamstring curl with bilateral upper extremity support, cueing for knee alignment for recruitment of hamstring musculature, 10x each LE   Seated with # 3lb ankle weights  -Seated marches with upright posture, back away from back of chair for abdominal/trunk activation/stabilization, 10x each LE -Seated LAQ with 3 second holds, 10x each LE, cueing for muscle activation and sequencing for neutral alignment -Seated IR/ER with cueing for stabilizing knee placement with lateral foot movement for optimal muscle recruitment, 10x each LE  Manual:   STM to upper trap and levator scap for reduction of tension and migraine relief x 8 minutes           Pt educated throughout session about proper posture and technique with exercises. Improved exercise technique, movement at target joints, use of target muscles after min to mod verbal, visual, tactile cues    Patient tolerated increased intervention challenge. Patient is able to perform standing interventions facing away from mirror to decrease dizziness. He is highly motivated for progressive strengthening and stabilization. Occasional  instability of R ankle noted throughout session. Manual decreased pain in head that had increased by end of session. The patient continues to benefit from additional skilled PT services to improve LE strength and balance for decreased fall risk and improved quality of life                    PT Education - 05/29/21 2148     Education Details exercise technique, body mechanics    Person(s) Educated Patient    Methods Explanation;Demonstration;Tactile cues;Verbal cues    Comprehension Verbalized understanding;Returned demonstration;Verbal cues required;Tactile cues required              PT Short Term Goals - 05/26/21 1614       PT SHORT TERM GOAL #1   Title Patient will be independent in home exercise program to improve strength/mobility for better functional independence with ADLs and for self-management.    Baseline 3/3 HEP compliant 3/31 hep compliant    Time 6    Period Weeks    Status Achieved    Target Date 02/20/21      PT SHORT TERM GOAL #2   Title Patient will be modified independent in walking on even/uneven surface with least restrictive assistive device, for 10+ minutes without rest break, reporting some difficulty or less to improve walking tolerance with community ambulation including grocery shopping, going to church,etc.    Baseline 02/03: instability, LOB multiple times while ambulating to elevator 3/31: unable to test due to throwing up 6/20: 6 minutes    Time 6    Period Weeks    Status Partially Met    Target Date 07/07/21      PT SHORT TERM GOAL #3   Title Patient will increase ABC scale score >80% to demonstrate better functional mobility and better confidence with ADLs.    Baseline 01/09/21: 60.625% 3/3/: 50% 3/31: 63% 6/20: 45%    Time 6    Period Weeks    Status On-going    Target Date 07/07/21               PT Long Term Goals - 05/26/21 1603       PT LONG TERM GOAL #1   Title Patient will increase FOTO score to equal to or  greater than 65 to demonstrate statistically significant improvement in mobility and quality of  life.    Baseline scored 41/100 on 05/21/2020; 7/22 40, 8/23: 60/100, 09/30/20=40, 01/09/21: 46 3/3: 40.3% 3/31: 49% 6/20: 45%    Time 12    Period Weeks    Status Partially Met    Target Date 08/18/21      PT LONG TERM GOAL #2   Title Patient will reduce falls risk as indicated by decreased TUG time to less than 11 seconds.    Baseline scored 37.9 sec with TUG on 05/21/2020; 21seconds with elevated rollator 7/22, 8/23: 13.62 sec with up and go walker, 01/09/21: 15.81s with hiking stick 3/3: 17.98 seconds one near LOB 3/31: 15.57 seconds with walking stick 6/20: 14.8 seconds with walking stick    Time 12    Period Weeks    Status Partially Met    Target Date 08/18/21      PT LONG TERM GOAL #3   Title Patient will increase right UE and LE gross strength to 4+/5 throughout to improve functional strength for independent gait, increased standing tolerance and increased ADL ability.    Baseline grossly +3/5 to -4/5 right UE and LE strength on 05/21/2020; grossly 4-/5 for RUE, grossly 4-/5 for RLE on 7/22, grossly 4-/5 RUE, grossly 4/5 RLE 3/3: see note    Time 8    Period Weeks    Status Partially Met    Target Date 08/18/21      PT LONG TERM GOAL #4   Title Patient will increase 10 meter walk test to >1.71ms as to improve gait speed for better community ambulation and to reduce fall risk.    Baseline on 7/22 .57 m/sself selected, fast  .73 m/s with LOB pt reported R knee buckling, elevated rollator used, 8/23: 1.06 m/s with up and go walker, 01/09/21: 0.72 m/s with hiking stick 3/3: 0.74 m/s with walking stick 3/31: 0.81 m/s w walking stick 6/20: 1.1 m/s with walking stick    Time 12    Period Weeks    Status Achieved      PT LONG TERM GOAL #5   Title Patient will increase Berg Balance score by > 6 points to demonstrate decreased fall risk during functional activities.    Baseline 8/23: 37/56,  09/30/20=40/56, 12/20: 42/56 3/3: deferred 3/31 will perform next time; deferred due to throwing up 6/20: 46/56    Time 8    Period Weeks    Status Achieved      Additional Long Term Goals   Additional Long Term Goals Yes      PT LONG TERM GOAL #6   Title Patient (< 64years old) will complete five times sit to stand test in < 10 seconds indicating an increased LE strength and improved balance.    Baseline 8/23: 22 sec, 09/30/20=13.04 sec, 12/20: 27.05 sec, 01/09/21: 18.01s 3/3: 23 seconds no UE support 3/31; deferred due to patient throwing up    Time 12    Period Weeks    Status Partially Met    Target Date 08/18/21      PT LONG TERM GOAL #7   Title Patient will improve 6 min walk test >1000 feet with LRAD for improved gait ability in community.    Baseline 8/23: not tested; 8/30 845, goal adjusted >2007f 09/30/20=600 feet - stopped test due to R ankle catching making gait unsafe, 11/25/20= 330 feet - stopped test due to severe dizziness, vomiting/nausea, sweating making gait unsafe; 01/09/21: 720 ft with no rest breaks 3/3: deferred due to nausea 3/31: deferred  due to patient throwing up 6/20: 640 ft    Time 12    Period Weeks    Status Partially Met    Target Date 08/18/21      PT LONG TERM GOAL #8   Title Patient will increase Berg Balance score by > 6 points  (52/56) to demonstrate decreased fall risk during functional activities.    Baseline 6/20: 46/56    Time 12    Period Weeks    Status New    Target Date 08/18/21                   Plan - 05/29/21 2153     Clinical Impression Statement Patient tolerated increased intervention challenge. Patient is able to perform standing interventions facing away from mirror to decrease dizziness. He is highly motivated for progressive strengthening and stabilization. Occasional instability of R ankle noted throughout session. Manual decreased pain in head that had increased by end of session. The patient continues to benefit  from additional skilled PT services to improve LE strength and balance for decreased fall risk and improved quality of life    Personal Factors and Comorbidities Comorbidity 3+;Time since onset of injury/illness/exacerbation    Comorbidities anxiety, back pain, COPD, headache, HTN, MI, sleep apnea, migraines    Examination-Activity Limitations Locomotion Level;Transfers;Bed Mobility;Stand;Stairs;Sleep;Squat;Bend    Examination-Participation Restrictions Driving;Medication Management    Stability/Clinical Decision Making Unstable/Unpredictable    Rehab Potential Fair    PT Frequency 2x / week    PT Duration 12 weeks    PT Treatment/Interventions ADLs/Self Care Home Management;Canalith Repostioning;Moist Heat;Electrical Stimulation;Gait training;Stair training;Functional mobility training;Therapeutic activities;Therapeutic exercise;Balance training;Neuromuscular re-education;Patient/family education;Manual techniques;Passive range of motion;Vestibular    PT Next Visit Plan update HEP ea session for continued strengthening/balanceing at home    PT [place] Access Code: [email or web address] updated this session    Consulted and Agree with Plan of Care Patient;Other (Comment)   Friend            Patient will benefit from skilled therapeutic intervention in order to improve the following deficits and impairments:  Decreased activity tolerance, Decreased balance, Decreased cognition, Decreased mobility, Difficulty walking, Abnormal gait, Decreased range of motion, Dizziness, Pain, Impaired flexibility, Decreased strength, Impaired sensation, Decreased endurance, Decreased safety awareness  Visit Diagnosis: Muscle weakness (generalized)  Unsteadiness on feet  Dizziness and giddiness     Problem List Patient Active Problem List   Diagnosis Date Noted   Degenerative spondylolisthesis 03/25/2020   Physical deconditioning 03/04/2020   Lobar pneumonia, unspecified organism ([place])  03/04/2020   Intractable hemiplegic migraine with status migrainosus 03/02/2020   Current tobacco use 02/05/2020   Abnormal findings on diagnostic imaging of lung 02/05/2020   Shortness of breath 02/05/2020   Herniated disc, cervical 01/23/2020   Cervical spondylosis with myelopathy and radiculopathy 01/23/2020   Pre-operative respiratory examination 01/12/2020   Weakness on right side of face 12/22/2019   Osteoarthritis of spine with radiculopathy, lumbar region 12/11/2019   Intractable migraine with aura with status migrainosus 11/09/2019   Former smoker 08/27/2018   Benign essential HTN 08/27/2018   Cough productive of purulent sputum 11/08/2017   GERD (gastroesophageal reflux disease) 05/06/2017   Contusion of hand 04/22/2017   BP (high blood pressure) 04/22/2017   Oxygen desaturation 04/22/2017   Prostate lump 04/22/2017   Unstable angina ([place]) 04/27/2016   Chest pain 04/24/2016   Morbid obesity ([place]) 01/29/2016   Elevated ALT measurement 10/30/2015   Nonspecific elevation of levels of transaminase  and lactic acid dehydrogenase (ldh) 10/30/2015   Reduced libido 10/29/2015   Hyperlipidemia 08/06/2015   Anxiety 08/06/2015   Atherosclerosis of coronary artery 08/06/2015   Childhood asthma 08/06/2015   Chronic obstructive pulmonary disease ([place]) 08/06/2015   OSA (obstructive sleep apnea) 08/06/2015   Anxiety disorder 08/06/2015   Atherosclerosis of native coronary artery of native heart with stable angina pectoris ([place]) 81/66/1969   Uncomplicated asthma 40/98/2867   Janna Arch, PT, DPT  05/29/2021, 9:54 PM  [place] MAIN [facility] SERVICES [address] [place], [place], [address] Phone: [phone]   Fax:  [phone]  Name: CONRAD ZAJKOWSKI MRN: [id number] Date of Birth: [birth year]

## 2021-06-02 ENCOUNTER — Ambulatory Visit: Payer: BC Managed Care – PPO

## 2021-06-05 ENCOUNTER — Ambulatory Visit: Payer: BC Managed Care – PPO

## 2021-06-05 ENCOUNTER — Other Ambulatory Visit: Payer: Self-pay

## 2021-06-05 DIAGNOSIS — R42 Dizziness and giddiness: Secondary | ICD-10-CM

## 2021-06-05 DIAGNOSIS — M6281 Muscle weakness (generalized): Secondary | ICD-10-CM | POA: Diagnosis not present

## 2021-06-05 DIAGNOSIS — R2681 Unsteadiness on feet: Secondary | ICD-10-CM

## 2021-06-05 NOTE — Therapy (Signed)
[place] MAIN [facility] SERVICES [address] [place], [place], [address] Phone: [phone]   Fax:  [phone]  Physical Therapy Treatment  Patient Details  Name: Cory Espinoza MRN: [id number] Date of Birth: August 14, 1971 Referring Provider (PT): Dr. Joselyn Arrow   Encounter Date: 06/05/2021   PT End of Session - 06/05/21 1607     Visit Number 19    Number of Visits 75    Date for PT Re-Evaluation 08/18/21    Authorization Type [employer]BS PPO;  7/10 PN 04/10/21    Authorization Time Period 01/09/21-04/03/21    PT Start Time 1516    PT Stop Time 1600    PT Time Calculation (min) 44 min    Equipment Utilized During Treatment Gait belt    Activity Tolerance Patient tolerated treatment well    Behavior During Therapy [facility] for tasks assessed/performed             Past Medical History:  Diagnosis Date   Allergy    Anginal pain (HCC)    Anxiety    Arthritis    lumbar spine   Asthma    Atrial fibrillation (HCC)    Back pain    Severe Lumbar Pain   CHF (congestive heart failure) (HCC)    COPD (chronic obstructive pulmonary disease) ([place])    Restrictive lung disease   Coronary artery disease    Dyspnea    Dysrhythmia    afib   GERD (gastroesophageal reflux disease)    Headache    [name], onset October 2020, cluster headaches in the past   History of kidney stones    Hyperlipidemia    Hypertension    Myocardial infarction ([place])    at age 29   Pneumonia    Restrictive lung disease    Sleep apnea    unable to use cpap since onset of migraines    Past Surgical History:  Procedure Laterality Date   ABDOMINAL EXPOSURE N/A 03/25/2020   Procedure: ABDOMINAL EXPOSURE;  Surgeon: Rosetta Posner, MD;  Location: [facility];  Service: Vascular;  Laterality: N/A;   ANTERIOR CERVICAL DECOMP/DISCECTOMY FUSION N/A 01/23/2020   Procedure: ANTERIOR CERVICAL DECOMPRESSION FUSION - CERVICAL SIX-CERVICAL SEVEN;  Surgeon: Earnie Larsson, MD;  Location: [place];   Service: Neurosurgery;  Laterality: N/A;   ANTERIOR LUMBAR FUSION N/A 03/25/2020   Procedure: ANTERIOR LUMBAR INTERBODY FUSION LUMBAR FIVE-SACRAL ONE.;  Surgeon: Earnie Larsson, MD;  Location: [place];  Service: Neurosurgery;  Laterality: N/A;  anterior   BACK SURGERY  1996   CARDIAC CATHETERIZATION Left 04/27/2016   Procedure: Left Heart Cath and Coronary Angiography;  Surgeon: Corey Skains, MD;  Location: [place] CV LAB;  Service: Cardiovascular;  Laterality: Left;   CARDIAC CATHETERIZATION N/A 04/27/2016   Procedure: Intravascular Pressure Wire/FFR Study;  Surgeon: Yolonda Kida, MD;  Location: [place] [facility];  Service: Cardiovascular;  Laterality: N/A;   CATHETERIZATION OF PULMONARY ARTERY WITH RETRIEVAL OF FOREIGN BODY Bilateral 04/07/2011   heart   DEGENERATIVE SPONDYLOLISTHESIS     KNEE ARTHROSCOPY Bilateral 2004   LUMBAR DISC SURGERY  1999   RADICULOPATHY, CERVICAL REGION     TONSILLECTOMY      There were no vitals filed for this visit.   Subjective Assessment - 06/05/21 1605     Subjective Patient tripped today but did not land on ground, wasn't wearing his brace, turned when getting clothes out of closet. Went to grab door knob and hit his head  on the door.    Pertinent History Pt has a complex medical history. 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 He states that he fell 10-12 feet and struck his head, neck, and back. He was able to finish the course which took him approximately 45 minutes more to complete. He does endorse a second fall while finishing the course but did not suffer a second head injury. He states that he started getting headaches that day which have persisted since that time. 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 He does report improvement in his headaches recently. Neurology was concerned about possible BPPV and have referred him for a  vestibular evaluation. In addition since the injury, pt developed RUE and RLE pain and weakness. Pt states that NCV showed abnormal nerve conduction in RUE. He has since undergone a C7-7 ACDF on 01/23/20. He reports initial improvement in RUE strength, but states that he has a gradual return of his RUE weakness. He was also having significant RLE weakness and pain and underwent and L5-S1 anterior lumbar interbody fusion on 03/25/20. Patient reports that he does not have any back precautions but needs to wear the low back brace for one more month. Patient reports he has a follow-up appointment with the surgeon next month. Patient reports he has a follow-up appointment with Dr. Brigitte Pulse, neurologist, in August. He arrives to therapy ambulating with an upright rollator walker. Pt denies any mention of concussion after his injury. He has been having cognitive issues since his injury and has previously had a heavy metals screen as well as neurocognitive testing. He has had an MRI of his brain with and without contrast on 12/18/19. Results were mildly motion degraded examination. No evidence of acute intracranial abnormality. Minimal chronic small vessel ischemic disease. He has tremor in both his hands. He has a positive family history of Parkinson's disease in his grandfather. He is complaining of constant dizziness and unsteadiness which are worse with activity. Patient states that he frequently loses his balance and his wife has to steady him.    Limitations Lifting;Standing;Walking;House hold activities    Diagnostic tests He has had an MRI of his brain with and without contrast on 12/18/19. Results were mildly motion degraded examination. No evidence of acute intracranial abnormality. Minimal chronic small vessel ischemic disease.    Patient Stated Goals to be able to ambulate with a cane or no AD, to be able to drive, to be able to ride his motorcycle    Currently in Pain? Yes    Pain Score 7     Pain Location Head     Pain Descriptors / Indicators Headache    Pain Onset More than a month ago    Pain Frequency Constant               Treatment:   in room with lights off and noise damped due to migraine.    Seated:  4lb weight: -LAQ 12x each LE: very fatiguing; 3 second holds  -March 15x each LE -IR/ER 15x  -step over theraband and back 10x each LE; 2 sets -lateral step over theraband and back 10x each LE; 2 sets  -heel toe raise 15x    STM to bilateral upper traps, levator scap, and cervical paraspinals x 13 minutes, implementation of effleurage and ptrissage included.  patient reports decreased headache pain by end of  session.   HEP performance and demonstration:   Access Code: [email or web address] URL: [email or web address] Date: 06/05/2021 Prepared by: Janna Arch  Exercises  Seated Shoulder Row with Anchored Resistance - 1 x daily - 7 x weekly - 2 sets - 10 reps - 5 hold Seated Shoulder External Rotation - 1 x daily - 7 x weekly - 2 sets - 10 reps - 5 hold Seated Shoulder Internal Rotation with Anchored Resistance - 1 x daily - 7 x weekly - 2 sets - 10 reps - 5 hold Seated Shoulder Front Raise with Resistance - 1 x daily - 7 x weekly - 2 sets - 10 reps - 5 hold Seated Shoulder Abduction with Resistance - 1 x daily - 7 x weekly - 2 sets - 10 reps - 5 hold     Pt educated throughout session about proper posture and technique with exercises. Improved exercise technique, movement at target joints, use of target muscles after min to mod verbal, visual, tactile cues   Patient tolerated interventions well this session despite headache. Educated on UE strengthening HEP with patient demonstrating understanding. Patient's migraine partially relieved with STM indicating possible cervical involvement in addition to cranial involvement. The patient continues to benefit from additional skilled PT services to improve LE strength and balance for decreased fall risk and improved quality of  life               PT Education - 06/05/21 1605     Education Details exercise technique, body mechanics, HEP    Person(s) Educated Patient    Methods Explanation;Demonstration;Tactile cues;Verbal cues    Comprehension Verbalized understanding;Returned demonstration;Verbal cues required;Tactile cues required              PT Short Term Goals - 05/26/21 1614       PT SHORT TERM GOAL #1   Title Patient will be independent in home exercise program to improve strength/mobility for better functional independence with ADLs and for self-management.    Baseline 3/3 HEP compliant 3/31 hep compliant    Time 6    Period Weeks    Status Achieved    Target Date 02/20/21      PT SHORT TERM GOAL #2   Title Patient will be modified independent in walking on even/uneven surface with least restrictive assistive device, for 10+ minutes without rest break, reporting some difficulty or less to improve walking tolerance with community ambulation including grocery shopping, going to church,etc.    Baseline 02/03: instability, LOB multiple times while ambulating to elevator 3/31: unable to test due to throwing up 6/20: 6 minutes    Time 6    Period Weeks    Status Partially Met    Target Date 07/07/21      PT SHORT TERM GOAL #3   Title Patient will increase ABC scale score >80% to demonstrate better functional mobility and better confidence with ADLs.    Baseline 01/09/21: 60.625% 3/3/: 50% 3/31: 63% 6/20: 45%    Time 6    Period Weeks    Status On-going    Target Date 07/07/21               PT Long Term Goals - 05/26/21 1603       PT LONG TERM GOAL #1   Title Patient will increase FOTO score to equal to or greater than 65 to demonstrate statistically significant improvement in mobility and quality of life.    Baseline scored 41/100 on 05/21/2020; 7/22 40, 8/23: 60/100,  09/30/20=40, 01/09/21: 46 3/3: 40.3% 3/31: 49% 6/20: 45%    Time 12    Period Weeks    Status Partially  Met    Target Date 08/18/21      PT LONG TERM GOAL #2   Title Patient will reduce falls risk as indicated by decreased TUG time to less than 11 seconds.    Baseline scored 37.9 sec with TUG on 05/21/2020; 21seconds with elevated rollator 7/22, 8/23: 13.62 sec with up and go walker, 01/09/21: 15.81s with hiking stick 3/3: 17.98 seconds one near LOB 3/31: 15.57 seconds with walking stick 6/20: 14.8 seconds with walking stick    Time 12    Period Weeks    Status Partially Met    Target Date 08/18/21      PT LONG TERM GOAL #3   Title Patient will increase right UE and LE gross strength to 4+/5 throughout to improve functional strength for independent gait, increased standing tolerance and increased ADL ability.    Baseline grossly +3/5 to -4/5 right UE and LE strength on 05/21/2020; grossly 4-/5 for RUE, grossly 4-/5 for RLE on 7/22, grossly 4-/5 RUE, grossly 4/5 RLE 3/3: see note    Time 8    Period Weeks    Status Partially Met    Target Date 08/18/21      PT LONG TERM GOAL #4   Title Patient will increase 10 meter walk test to >1.53ms as to improve gait speed for better community ambulation and to reduce fall risk.    Baseline on 7/22 .57 m/sself selected, fast  .73 m/s with LOB pt reported R knee buckling, elevated rollator used, 8/23: 1.06 m/s with up and go walker, 01/09/21: 0.72 m/s with hiking stick 3/3: 0.74 m/s with walking stick 3/31: 0.81 m/s w walking stick 6/20: 1.1 m/s with walking stick    Time 12    Period Weeks    Status Achieved      PT LONG TERM GOAL #5   Title Patient will increase Berg Balance score by > 6 points to demonstrate decreased fall risk during functional activities.    Baseline 8/23: 37/56, 09/30/20=40/56, 12/20: 42/56 3/3: deferred 3/31 will perform next time; deferred due to throwing up 6/20: 46/56    Time 8    Period Weeks    Status Achieved      Additional Long Term Goals   Additional Long Term Goals Yes      PT LONG TERM GOAL #6   Title Patient (<  690years old) will complete five times sit to stand test in < 10 seconds indicating an increased LE strength and improved balance.    Baseline 8/23: 22 sec, 09/30/20=13.04 sec, 12/20: 27.05 sec, 01/09/21: 18.01s 3/3: 23 seconds no UE support 3/31; deferred due to patient throwing up    Time 12    Period Weeks    Status Partially Met    Target Date 08/18/21      PT LONG TERM GOAL #7   Title Patient will improve 6 min walk test >1000 feet with LRAD for improved gait ability in community.    Baseline 8/23: not tested; 8/30 845, goal adjusted >20017f 09/30/20=600 feet - stopped test due to R ankle catching making gait unsafe, 11/25/20= 330 feet - stopped test due to severe dizziness, vomiting/nausea, sweating making gait unsafe; 01/09/21: 720 ft with no rest breaks 3/3: deferred due to nausea 3/31: deferred due to patient throwing up 6/20: 640 ft    Time  12    Period Weeks    Status Partially Met    Target Date 08/18/21      PT LONG TERM GOAL #8   Title Patient will increase Berg Balance score by > 6 points  (52/56) to demonstrate decreased fall risk during functional activities.    Baseline 6/20: 46/56    Time 12    Period Weeks    Status New    Target Date 08/18/21                   Plan - 06/05/21 1608     Clinical Impression Statement Patient tolerated interventions well this session despite headache. Educated on UE strengthening HEP with patient demonstrating understanding. Patient's migraine partially relieved with STM indicating possible cervical involvement in addition to cranial involvement. The patient continues to benefit from additional skilled PT services to improve LE strength and balance for decreased fall risk and improved quality of life    Personal Factors and Comorbidities Comorbidity 3+;Time since onset of injury/illness/exacerbation    Comorbidities anxiety, back pain, COPD, headache, HTN, MI, sleep apnea, migraines    Examination-Activity Limitations  Locomotion Level;Transfers;Bed Mobility;Stand;Stairs;Sleep;Squat;Bend    Examination-Participation Restrictions Driving;Medication Management    Stability/Clinical Decision Making Unstable/Unpredictable    Rehab Potential Fair    PT Frequency 2x / week    PT Duration 12 weeks    PT Treatment/Interventions ADLs/Self Care Home Management;Canalith Repostioning;Moist Heat;Electrical Stimulation;Gait training;Stair training;Functional mobility training;Therapeutic activities;Therapeutic exercise;Balance training;Neuromuscular re-education;Patient/family education;Manual techniques;Passive range of motion;Vestibular    PT Next Visit Plan update HEP ea session for continued strengthening/balanceing at home    PT [place] Access Code: [email or web address] updated this session    Consulted and Agree with Plan of Care Patient;Other (Comment)   Friend            Patient will benefit from skilled therapeutic intervention in order to improve the following deficits and impairments:  Decreased activity tolerance, Decreased balance, Decreased cognition, Decreased mobility, Difficulty walking, Abnormal gait, Decreased range of motion, Dizziness, Pain, Impaired flexibility, Decreased strength, Impaired sensation, Decreased endurance, Decreased safety awareness  Visit Diagnosis: Muscle weakness (generalized)  Unsteadiness on feet  Dizziness and giddiness     Problem List Patient Active Problem List   Diagnosis Date Noted   Degenerative spondylolisthesis 03/25/2020   Physical deconditioning 03/04/2020   Lobar pneumonia, unspecified organism ([place]) 03/04/2020   Intractable hemiplegic migraine with status migrainosus 03/02/2020   Current tobacco use 02/05/2020   Abnormal findings on diagnostic imaging of lung 02/05/2020   Shortness of breath 02/05/2020   Herniated disc, cervical 01/23/2020   Cervical spondylosis with myelopathy and radiculopathy 01/23/2020   Pre-operative respiratory  examination 01/12/2020   Weakness on right side of face 12/22/2019   Osteoarthritis of spine with radiculopathy, lumbar region 12/11/2019   Intractable migraine with aura with status migrainosus 11/09/2019   Former smoker 08/27/2018   Benign essential HTN 08/27/2018   Cough productive of purulent sputum 11/08/2017   GERD (gastroesophageal reflux disease) 05/06/2017   Contusion of hand 04/22/2017   BP (high blood pressure) 04/22/2017   Oxygen desaturation 04/22/2017   Prostate lump 04/22/2017   Unstable angina ([place]) 04/27/2016   Chest pain 04/24/2016   Morbid obesity ([place]) 01/29/2016   Elevated ALT measurement 10/30/2015   Nonspecific elevation of levels of transaminase and lactic acid dehydrogenase (ldh) 10/30/2015   Reduced libido 10/29/2015   Hyperlipidemia 08/06/2015   Anxiety 08/06/2015   Atherosclerosis of coronary artery 08/06/2015  Childhood asthma 08/06/2015   Chronic obstructive pulmonary disease ([place]) 08/06/2015   OSA (obstructive sleep apnea) 08/06/2015   Anxiety disorder 08/06/2015   Atherosclerosis of native coronary artery of native heart with stable angina pectoris ([place]) 91/05/8165   Uncomplicated asthma 19/69/4098   Janna Arch, PT, DPT  06/05/2021, 4:09 PM  [place] MAIN [facility] SERVICES [address] [place], [place], [address] Phone: [phone]   Fax:  [phone]  Name: Cory Espinoza MRN: [id number] Date of Birth: Oct 29, 1971

## 2021-06-12 ENCOUNTER — Other Ambulatory Visit: Payer: Self-pay

## 2021-06-12 ENCOUNTER — Ambulatory Visit: Payer: BC Managed Care – PPO | Attending: Neurology

## 2021-06-12 DIAGNOSIS — R2681 Unsteadiness on feet: Secondary | ICD-10-CM | POA: Diagnosis present

## 2021-06-12 DIAGNOSIS — R42 Dizziness and giddiness: Secondary | ICD-10-CM

## 2021-06-12 DIAGNOSIS — M6281 Muscle weakness (generalized): Secondary | ICD-10-CM

## 2021-06-12 NOTE — Therapy (Signed)
[place] MAIN [facility] SERVICES [address] [place], [place], [address] Phone: [phone]   Fax:  [phone]  Physical Therapy Treatment  Patient Details  Name: Cory Espinoza MRN: [id number] Date of Birth: Dec 23, 1970 Referring Provider (PT): Dr. Joselyn Arrow   Encounter Date: 06/12/2021   PT End of Session - 06/12/21 1514     Visit Number 27    Number of Visits 32    Date for PT Re-Evaluation 08/18/21    Authorization Type [employer] PPO;  8/10 PN 04/10/21    Authorization Time Period 01/09/21-04/03/21    PT Start Time 1515    PT Stop Time 1559    PT Time Calculation (min) 44 min    Equipment Utilized During Treatment Gait belt    Activity Tolerance Patient tolerated treatment well    Behavior During Therapy [facility] for tasks assessed/performed             Past Medical History:  Diagnosis Date   Allergy    Anginal pain (HCC)    Anxiety    Arthritis    lumbar spine   Asthma    Atrial fibrillation (HCC)    Back pain    Severe Lumbar Pain   CHF (congestive heart failure) (HCC)    COPD (chronic obstructive pulmonary disease) ([place])    Restrictive lung disease   Coronary artery disease    Dyspnea    Dysrhythmia    afib   GERD (gastroesophageal reflux disease)    Headache    [name], onset October 2020, cluster headaches in the past   History of kidney stones    Hyperlipidemia    Hypertension    Myocardial infarction ([place])    at age 9   Pneumonia    Restrictive lung disease    Sleep apnea    unable to use cpap since onset of migraines    Past Surgical History:  Procedure Laterality Date   ABDOMINAL EXPOSURE N/A 03/25/2020   Procedure: ABDOMINAL EXPOSURE;  Surgeon: Rosetta Posner, MD;  Location: [facility] OR;  Service: Vascular;  Laterality: N/A;   ANTERIOR CERVICAL DECOMP/DISCECTOMY FUSION N/A 01/23/2020   Procedure: ANTERIOR CERVICAL DECOMPRESSION FUSION - CERVICAL SIX-CERVICAL SEVEN;  Surgeon: Earnie Larsson, MD;  Location: [place];   Service: Neurosurgery;  Laterality: N/A;   ANTERIOR LUMBAR FUSION N/A 03/25/2020   Procedure: ANTERIOR LUMBAR INTERBODY FUSION LUMBAR FIVE-SACRAL ONE.;  Surgeon: Earnie Larsson, MD;  Location: [place];  Service: Neurosurgery;  Laterality: N/A;  anterior   BACK SURGERY  1996   CARDIAC CATHETERIZATION Left 04/27/2016   Procedure: Left Heart Cath and Coronary Angiography;  Surgeon: Corey Skains, MD;  Location: [place] CV LAB;  Service: Cardiovascular;  Laterality: Left;   CARDIAC CATHETERIZATION N/A 04/27/2016   Procedure: Intravascular Pressure Wire/FFR Study;  Surgeon: Yolonda Kida, MD;  Location: [place] CV LAB;  Service: Cardiovascular;  Laterality: N/A;   CATHETERIZATION OF PULMONARY ARTERY WITH RETRIEVAL OF FOREIGN BODY Bilateral 04/07/2011   heart   DEGENERATIVE SPONDYLOLISTHESIS     KNEE ARTHROSCOPY Bilateral 2004   LUMBAR DISC SURGERY  1999   RADICULOPATHY, CERVICAL REGION     TONSILLECTOMY      There were no vitals filed for this visit.   Subjective Assessment - 06/12/21 1712     Subjective Patient had two seizures since he was last seen. Doctor is aware. Will be going to [place] soon to visit his family.    Pertinent History Pt has  a complex medical history. 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 He states that he fell 10-12 feet and struck his head, neck, and back. He was able to finish the course which took him approximately 45 minutes more to complete. He does endorse a second fall while finishing the course but did not suffer a second head injury. He states that he started getting headaches that day which have persisted since that time. 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 He does report improvement in his headaches recently. Neurology was concerned about possible BPPV and have referred him for a vestibular evaluation. In addition since the injury, pt  developed RUE and RLE pain and weakness. Pt states that NCV showed abnormal nerve conduction in RUE. He has since undergone a C7-7 ACDF on 01/23/20. He reports initial improvement in RUE strength, but states that he has a gradual return of his RUE weakness. He was also having significant RLE weakness and pain and underwent and L5-S1 anterior lumbar interbody fusion on 03/25/20. Patient reports that he does not have any back precautions but needs to wear the low back brace for one more month. Patient reports he has a follow-up appointment with the surgeon next month. Patient reports he has a follow-up appointment with Dr. Brigitte Pulse, neurologist, in August. He arrives to therapy ambulating with an upright rollator walker. Pt denies any mention of concussion after his injury. He has been having cognitive issues since his injury and has previously had a heavy metals screen as well as neurocognitive testing. He has had an MRI of his brain with and without contrast on 12/18/19. Results were mildly motion degraded examination. No evidence of acute intracranial abnormality. Minimal chronic small vessel ischemic disease. He has tremor in both his hands. He has a positive family history of Parkinson's disease in his grandfather. He is complaining of constant dizziness and unsteadiness which are worse with activity. Patient states that he frequently loses his balance and his wife has to steady him.    Limitations Lifting;Standing;Walking;House hold activities    Diagnostic tests He has had an MRI of his brain with and without contrast on 12/18/19. Results were mildly motion degraded examination. No evidence of acute intracranial abnormality. Minimal chronic small vessel ischemic disease.    Patient Stated Goals to be able to ambulate with a cane or no AD, to be able to drive, to be able to ride his motorcycle    Currently in Pain? Yes    Pain Score 8     Pain Location Head    Pain Orientation Mid    Pain Descriptors /  Indicators Headache    Pain Type Chronic pain    Pain Onset More than a month ago    Pain Frequency Constant                   Treatment:    in room with lights off and noise damped due to migraine.    TherEx Seated:  3lb weight:cues for body mechanics sequencing with tactile cue for full arc of motion -LAQ 15x each LE: very fatiguing; 3 second holds  -March 15x each LE -IR/ER 15x  -heel toe raise 15x    Weighted ball: (3000 gr) -chest press with cue to look at wall to decrease episodes of dizziness 10x -straight arm raise with tactile cue for scapular retraction and core  activation 10x; very challenging  Ambulate 190 ft with walking stick and close CGA, multiple episodes of R knee buckling requiring PT to assist patient to remain standing.   Manual:  STM to bilateral upper traps, levator scap, and cervical paraspinals x 13 minutes, implementation of effleurage and ptrissage included.  patient reports decreased headache pain by end of session.    Trigger Point Dry Needling (TDN), unbilled Education performed with patient regarding potential benefit of TDN. Reviewed precautions and risks with patient. Reviewed special precautions/risks over lung fields which include pneumothorax. Reviewed signs and symptoms of pneumothorax and advised pt to go to ER immediately if these symptoms develop advise them of dry needling treatment. Extensive time spent with pt to ensure full understanding of TDN risks. Pt provided verbal consent to treatment. TDN performed to  with 0.25 x 40 single needle placements with local twitch response (LTR). Pistoning technique utilized. Improved pain-free motion following intervention. Muscles focused on: bilateral upper trap  Pt educated throughout session about proper posture and technique with exercises. Improved exercise technique, movement at target joints, use of target muscles after min to mod verbal, visual, tactile cues  Patient has increased  migraine this session due to changes in weather. TDN performed with patient tolerating well as well as manual soft tissue mobilization for reduction of headdache. Patient required close CGA with ambulation with frequent episodes of knee buckling requiring PT to assist in maintaining patient in upright position.  The patient continues to benefit from additional skilled PT services to improve LE strength and balance for decreased fall risk and improved quality of life                     PT Education - 06/12/21 1514     Education Details exercise technique, body mechanics    Person(s) Educated Patient    Methods Explanation;Demonstration;Tactile cues;Verbal cues    Comprehension Verbalized understanding;Returned demonstration;Verbal cues required;Tactile cues required              PT Short Term Goals - 05/26/21 1614       PT SHORT TERM GOAL #1   Title Patient will be independent in home exercise program to improve strength/mobility for better functional independence with ADLs and for self-management.    Baseline 3/3 HEP compliant 3/31 hep compliant    Time 6    Period Weeks    Status Achieved    Target Date 02/20/21      PT SHORT TERM GOAL #2   Title Patient will be modified independent in walking on even/uneven surface with least restrictive assistive device, for 10+ minutes without rest break, reporting some difficulty or less to improve walking tolerance with community ambulation including grocery shopping, going to church,etc.    Baseline 02/03: instability, LOB multiple times while ambulating to elevator 3/31: unable to test due to throwing up 6/20: 6 minutes    Time 6    Period Weeks    Status Partially Met    Target Date 07/07/21      PT SHORT TERM GOAL #3   Title Patient will increase ABC scale score >80% to demonstrate better functional mobility and better confidence with ADLs.    Baseline 01/09/21: 60.625% 3/3/: 50% 3/31: 63% 6/20: 45%    Time 6     Period Weeks    Status On-going    Target Date 07/07/21               PT Long Term Goals -  05/26/21 1603       PT LONG TERM GOAL #1   Title Patient will increase FOTO score to equal to or greater than 65 to demonstrate statistically significant improvement in mobility and quality of life.    Baseline scored 41/100 on 05/21/2020; 7/22 40, 8/23: 60/100, 09/30/20=40, 01/09/21: 46 3/3: 40.3% 3/31: 49% 6/20: 45%    Time 12    Period Weeks    Status Partially Met    Target Date 08/18/21      PT LONG TERM GOAL #2   Title Patient will reduce falls risk as indicated by decreased TUG time to less than 11 seconds.    Baseline scored 37.9 sec with TUG on 05/21/2020; 21seconds with elevated rollator 7/22, 8/23: 13.62 sec with up and go walker, 01/09/21: 15.81s with hiking stick 3/3: 17.98 seconds one near LOB 3/31: 15.57 seconds with walking stick 6/20: 14.8 seconds with walking stick    Time 12    Period Weeks    Status Partially Met    Target Date 08/18/21      PT LONG TERM GOAL #3   Title Patient will increase right UE and LE gross strength to 4+/5 throughout to improve functional strength for independent gait, increased standing tolerance and increased ADL ability.    Baseline grossly +3/5 to -4/5 right UE and LE strength on 05/21/2020; grossly 4-/5 for RUE, grossly 4-/5 for RLE on 7/22, grossly 4-/5 RUE, grossly 4/5 RLE 3/3: see note    Time 8    Period Weeks    Status Partially Met    Target Date 08/18/21      PT LONG TERM GOAL #4   Title Patient will increase 10 meter walk test to >1.27ms as to improve gait speed for better community ambulation and to reduce fall risk.    Baseline on 7/22 .57 m/sself selected, fast  .73 m/s with LOB pt reported R knee buckling, elevated rollator used, 8/23: 1.06 m/s with up and go walker, 01/09/21: 0.72 m/s with hiking stick 3/3: 0.74 m/s with walking stick 3/31: 0.81 m/s w walking stick 6/20: 1.1 m/s with walking stick    Time 12    Period Weeks     Status Achieved      PT LONG TERM GOAL #5   Title Patient will increase Berg Balance score by > 6 points to demonstrate decreased fall risk during functional activities.    Baseline 8/23: 37/56, 09/30/20=40/56, 12/20: 42/56 3/3: deferred 3/31 will perform next time; deferred due to throwing up 6/20: 46/56    Time 8    Period Weeks    Status Achieved      Additional Long Term Goals   Additional Long Term Goals Yes      PT LONG TERM GOAL #6   Title Patient (< 61years old) will complete five times sit to stand test in < 10 seconds indicating an increased LE strength and improved balance.    Baseline 8/23: 22 sec, 09/30/20=13.04 sec, 12/20: 27.05 sec, 01/09/21: 18.01s 3/3: 23 seconds no UE support 3/31; deferred due to patient throwing up    Time 12    Period Weeks    Status Partially Met    Target Date 08/18/21      PT LONG TERM GOAL #7   Title Patient will improve 6 min walk test >1000 feet with LRAD for improved gait ability in community.    Baseline 8/23: not tested; 8/30 845, goal adjusted >20089f 09/30/20=600 feet - stopped  test due to R ankle catching making gait unsafe, 11/25/20= 330 feet - stopped test due to severe dizziness, vomiting/nausea, sweating making gait unsafe; 01/09/21: 720 ft with no rest breaks 3/3: deferred due to nausea 3/31: deferred due to patient throwing up 6/20: 640 ft    Time 12    Period Weeks    Status Partially Met    Target Date 08/18/21      PT LONG TERM GOAL #8   Title Patient will increase Berg Balance score by > 6 points  (52/56) to demonstrate decreased fall risk during functional activities.    Baseline 6/20: 46/56    Time 12    Period Weeks    Status New    Target Date 08/18/21                   Plan - 06/12/21 1714     Clinical Impression Statement Patient has increased migraine this session due to changes in weather. TDN performed with patient tolerating well as well as manual soft tissue mobilization for reduction of  headdache. Patient required close CGA with ambulation with frequent episodes of knee buckling requiring PT to assist in maintaining patient in upright position.  The patient continues to benefit from additional skilled PT services to improve LE strength and balance for decreased fall risk and improved quality of life    Personal Factors and Comorbidities Comorbidity 3+;Time since onset of injury/illness/exacerbation    Comorbidities anxiety, back pain, COPD, headache, HTN, MI, sleep apnea, migraines    Examination-Activity Limitations Locomotion Level;Transfers;Bed Mobility;Stand;Stairs;Sleep;Squat;Bend    Examination-Participation Restrictions Driving;Medication Management    Stability/Clinical Decision Making Unstable/Unpredictable    Rehab Potential Fair    PT Frequency 2x / week    PT Duration 12 weeks    PT Treatment/Interventions ADLs/Self Care Home Management;Canalith Repostioning;Moist Heat;Electrical Stimulation;Gait training;Stair training;Functional mobility training;Therapeutic activities;Therapeutic exercise;Balance training;Neuromuscular re-education;Patient/family education;Manual techniques;Passive range of motion;Vestibular    PT Next Visit Plan update HEP ea session for continued strengthening/balanceing at home    PT [place] Access Code: [email or web address] updated this session    Consulted and Agree with Plan of Care Patient;Other (Comment)   Friend            Patient will benefit from skilled therapeutic intervention in order to improve the following deficits and impairments:  Decreased activity tolerance, Decreased balance, Decreased cognition, Decreased mobility, Difficulty walking, Abnormal gait, Decreased range of motion, Dizziness, Pain, Impaired flexibility, Decreased strength, Impaired sensation, Decreased endurance, Decreased safety awareness  Visit Diagnosis: Muscle weakness (generalized)  Unsteadiness on feet  Dizziness and  giddiness     Problem List Patient Active Problem List   Diagnosis Date Noted   Degenerative spondylolisthesis 03/25/2020   Physical deconditioning 03/04/2020   Lobar pneumonia, unspecified organism ([place]) 03/04/2020   Intractable hemiplegic migraine with status migrainosus 03/02/2020   Current tobacco use 02/05/2020   Abnormal findings on diagnostic imaging of lung 02/05/2020   Shortness of breath 02/05/2020   Herniated disc, cervical 01/23/2020   Cervical spondylosis with myelopathy and radiculopathy 01/23/2020   Pre-operative respiratory examination 01/12/2020   Weakness on right side of face 12/22/2019   Osteoarthritis of spine with radiculopathy, lumbar region 12/11/2019   Intractable migraine with aura with status migrainosus 11/09/2019   Former smoker 08/27/2018   Benign essential HTN 08/27/2018   Cough productive of purulent sputum 11/08/2017   GERD (gastroesophageal reflux disease) 05/06/2017   Contusion of hand 04/22/2017   BP (high blood pressure) 04/22/2017  Oxygen desaturation 04/22/2017   Prostate lump 04/22/2017   Unstable angina ([place]) 04/27/2016   Chest pain 04/24/2016   Morbid obesity ([place]) 01/29/2016   Elevated ALT measurement 10/30/2015   Nonspecific elevation of levels of transaminase and lactic acid dehydrogenase (ldh) 10/30/2015   Reduced libido 10/29/2015   Hyperlipidemia 08/06/2015   Anxiety 08/06/2015   Atherosclerosis of coronary artery 08/06/2015   Childhood asthma 08/06/2015   Chronic obstructive pulmonary disease ([place]) 08/06/2015   OSA (obstructive sleep apnea) 08/06/2015   Anxiety disorder 08/06/2015   Atherosclerosis of native coronary artery of native heart with stable angina pectoris ([place]) 20/91/9802   Uncomplicated asthma 21/79/8102    Janna Arch, PT, DPT  06/12/2021, 5:16 PM  [place] MAIN [facility] SERVICES [address] [place], [place], [address] Phone: [phone]   Fax:   [phone]  Name: Cory Espinoza MRN: [id number] Date of Birth: 08-12-1971

## 2021-06-16 ENCOUNTER — Other Ambulatory Visit: Payer: Self-pay

## 2021-06-16 ENCOUNTER — Ambulatory Visit: Payer: BC Managed Care – PPO

## 2021-06-16 DIAGNOSIS — M6281 Muscle weakness (generalized): Secondary | ICD-10-CM | POA: Diagnosis not present

## 2021-06-16 DIAGNOSIS — R2681 Unsteadiness on feet: Secondary | ICD-10-CM

## 2021-06-16 DIAGNOSIS — R42 Dizziness and giddiness: Secondary | ICD-10-CM
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
## 2021-06-17 ENCOUNTER — Ambulatory Visit: Payer: BC Managed Care – PPO

## 2021-06-17 DIAGNOSIS — R2681 Unsteadiness on feet: Secondary | ICD-10-CM

## 2021-06-17 DIAGNOSIS — M6281 Muscle weakness (generalized): Secondary | ICD-10-CM

## 2021-06-17 DIAGNOSIS — R42 Dizziness and giddiness: Secondary | ICD-10-CM

## 2021-06-17 NOTE — Therapy (Signed)
[place] MAIN [facility] SERVICES [address] [place], [place], [address] Phone: [phone]   Fax:  [phone]  Physical Therapy Treatment Physical Therapy Progress Note   Dates of reporting period  04/10/21   to   06/17/21   Patient Details  Name: Cory Espinoza MRN: [id number] Date of Birth: 24-Mar-1971 Referring Provider (PT): Dr. Joselyn Arrow   Encounter Date: 06/17/2021   PT End of Session - 06/17/21 1354     Visit Number 33    Number of Visits 43    Date for PT Re-Evaluation 08/18/21    Authorization Type [employer] PPO; next session 1/10 PN 7/12    Authorization Time Period 01/09/21-04/03/21    PT Start Time 1015    PT Stop Time 1059    PT Time Calculation (min) 44 min    Equipment Utilized During Treatment Gait belt    Activity Tolerance Patient tolerated treatment well    Behavior During Therapy WFL for tasks assessed/performed             Past Medical History:  Diagnosis Date   Allergy    Anginal pain (HCC)    Anxiety    Arthritis    lumbar spine   Asthma    Atrial fibrillation (HCC)    Back pain    Severe Lumbar Pain   CHF (congestive heart failure) (HCC)    COPD (chronic obstructive pulmonary disease) ([place])    Restrictive lung disease   Coronary artery disease    Dyspnea    Dysrhythmia    afib   GERD (gastroesophageal reflux disease)    Headache    [name], onset October 2020, cluster headaches in the past   History of kidney stones    Hyperlipidemia    Hypertension    Myocardial infarction ([place])    at age 9   Pneumonia    Restrictive lung disease    Sleep apnea    unable to use cpap since onset of migraines    Past Surgical History:  Procedure Laterality Date   ABDOMINAL EXPOSURE N/A 03/25/2020   Procedure: ABDOMINAL EXPOSURE;  Surgeon: Rosetta Posner, MD;  Location: [facility] OR;  Service: Vascular;  Laterality: N/A;   ANTERIOR CERVICAL DECOMP/DISCECTOMY FUSION N/A 01/23/2020   Procedure: ANTERIOR CERVICAL  DECOMPRESSION FUSION - CERVICAL SIX-CERVICAL SEVEN;  Surgeon: Earnie Larsson, MD;  Location: [place];  Service: Neurosurgery;  Laterality: N/A;   ANTERIOR LUMBAR FUSION N/A 03/25/2020   Procedure: ANTERIOR LUMBAR INTERBODY FUSION LUMBAR FIVE-SACRAL ONE.;  Surgeon: Earnie Larsson, MD;  Location: [place];  Service: Neurosurgery;  Laterality: N/A;  anterior   BACK SURGERY  1996   CARDIAC CATHETERIZATION Left 04/27/2016   Procedure: Left Heart Cath and Coronary Angiography;  Surgeon: Corey Skains, MD;  Location: [facility];  Service: Cardiovascular;  Laterality: Left;   CARDIAC CATHETERIZATION N/A 04/27/2016   Procedure: Intravascular Pressure Wire/FFR Study;  Surgeon: Yolonda Kida, MD;  Location: [facility];  Service: Cardiovascular;  Laterality: N/A;   CATHETERIZATION OF PULMONARY ARTERY WITH RETRIEVAL OF FOREIGN BODY Bilateral 04/07/2011   heart   DEGENERATIVE SPONDYLOLISTHESIS     KNEE ARTHROSCOPY Bilateral 2004   LUMBAR DISC SURGERY  1999   RADICULOPATHY, CERVICAL REGION     TONSILLECTOMY      There were no vitals filed for this visit.   Subjective Assessment - 06/17/21 1302     Subjective Patient reports he has a headache. Is ready for  his trip thursday. Had new onset of RUE nerve symptoms in hand    Pertinent History Pt has a complex medical history. 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 He states that he fell 10-12 feet and struck his head, neck, and back. He was able to finish the course which took him approximately 45 minutes more to complete. He does endorse a second fall while finishing the course but did not suffer a second head injury. He states that he started getting headaches that day which have persisted since that time. 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 He does report improvement in his headaches recently. Neurology was concerned about  possible BPPV and have referred him for a vestibular evaluation. In addition since the injury, pt developed RUE and RLE pain and weakness. Pt states that NCV showed abnormal nerve conduction in RUE. He has since undergone a C7-7 ACDF on 01/23/20. He reports initial improvement in RUE strength, but states that he has a gradual return of his RUE weakness. He was also having significant RLE weakness and pain and underwent and L5-S1 anterior lumbar interbody fusion on 03/25/20. Patient reports that he does not have any back precautions but needs to wear the low back brace for one more month. Patient reports he has a follow-up appointment with the surgeon next month. Patient reports he has a follow-up appointment with Dr. Brigitte Pulse, neurologist, in August. He arrives to therapy ambulating with an upright rollator walker. Pt denies any mention of concussion after his injury. He has been having cognitive issues since his injury and has previously had a heavy metals screen as well as neurocognitive testing. He has had an MRI of his brain with and without contrast on 12/18/19. Results were mildly motion degraded examination. No evidence of acute intracranial abnormality. Minimal chronic small vessel ischemic disease. He has tremor in both his hands. He has a positive family history of Parkinson's disease in his grandfather. He is complaining of constant dizziness and unsteadiness which are worse with activity. Patient states that he frequently loses his balance and his wife has to steady him.    Limitations Lifting;Standing;Walking;House hold activities    Diagnostic tests He has had an MRI of his brain with and without contrast on 12/18/19. Results were mildly motion degraded examination. No evidence of acute intracranial abnormality. Minimal chronic small vessel ischemic disease.    Patient Stated Goals to be able to ambulate with a cane or no AD, to be able to drive, to be able to ride his motorcycle    Currently in Pain? Yes     Pain Score 8     Pain Location Head    Pain Descriptors / Indicators Aching;Penetrating    Pain Type Chronic pain;Neuropathic pain    Pain Onset More than a month ago    Pain Frequency Constant              Goals:  BERG: 38/56  STS: 15.2 seconds TUG-assess next session due to migraine FOTO: 50.7%  ABC-assess next session  6 min- assess next session due to migraine     Treatment:   TherEx Seated:  GTB: adduction against PT resistance 15x each LE GTB: abduction 15x each LE RTB hamstring curl 15x each LE RTB around bilateral ankles: alternating IR/ER 12x  RTB around bilateral ankles: LAQ 12x each LE  Green dynadisc hamstring isometric  press 12x 3 second holds  Ambulate 190 ft with walking stick and close CGA, multiple episodes of R knee buckling requiring PT to assist patient to remain standing.   Manual:  STM to bilateral upper traps, levator scap, and cervical paraspinals x 13 minutes, implementation of effleurage and ptrissage included.  patient reports decreased headache pain by end of session.    Patient's condition has the potential to improve in response to therapy. Maximum improvement is yet to be obtained. The anticipated improvement is attainable and reasonable in a generally predictable time.  Patient reports he has good days and bad days. Is not falling as often as before but wants to gain more independence.    Patient has progressed with FOTO and 5x STS goal. His walking and ABC goals will be assessed next session as today his migraine was too high to safely perform. His migraine did affect his stability as can be seen in his BERG score. Patient remains highly motivated despite migraine. Will see this patient once he returns from his trip. Patient's condition has the potential to improve in response to therapy. Maximum improvement is yet to be obtained. The anticipated improvement is attainable and reasonable in a generally predictable time. The patient  continues to benefit from additional skilled PT services to improve LE strength and balance for decreased fall risk and improved quality of life               PT Education - 06/17/21 1349     Education Details exercise technique, body mechanics    Person(s) Educated Patient    Methods Explanation;Demonstration;Tactile cues;Verbal cues    Comprehension Verbalized understanding;Returned demonstration;Verbal cues required;Tactile cues required              PT Short Term Goals - 06/17/21 1354       PT SHORT TERM GOAL #1   Title Patient will be independent in home exercise program to improve strength/mobility for better functional independence with ADLs and for self-management.    Baseline 3/3 HEP compliant 3/31 hep compliant    Time 6    Period Weeks    Status Achieved    Target Date 02/20/21      PT SHORT TERM GOAL #2   Title Patient will be modified independent in walking on even/uneven surface with least restrictive assistive device, for 10+ minutes without rest break, reporting some difficulty or less to improve walking tolerance with community ambulation including grocery shopping, going to church,etc.    Baseline 02/03: instability, LOB multiple times while ambulating to elevator 3/31: unable to test due to throwing up 6/20: 6 minutes 7/12: unable to test due to migraine    Time 6    Period Weeks    Status Partially Met    Target Date 07/07/21      PT SHORT TERM GOAL #3   Title Patient will increase ABC scale score >80% to demonstrate better functional mobility and better confidence with ADLs.    Baseline 01/09/21: 60.625% 3/3/: 50% 3/31: 63% 6/20: 45% 7/12: will test next session due to migraine    Time 6    Period Weeks    Status On-going    Target Date 07/07/21               PT Long Term Goals - 06/17/21 1355       PT LONG TERM GOAL #1   Title Patient will increase FOTO score to equal to or greater than 65 to demonstrate statistically  significant  improvement in mobility and quality of life.    Baseline scored 41/100 on 05/21/2020; 7/22 40, 8/23: 60/100, 09/30/20=40, 01/09/21: 46 3/3: 40.3% 3/31: 49% 6/20: 45% 7/12: 50.7%    Time 12    Period Weeks    Status Partially Met    Target Date 08/18/21      PT LONG TERM GOAL #2   Title Patient will reduce falls risk as indicated by decreased TUG time to less than 11 seconds.    Baseline scored 37.9 sec with TUG on 05/21/2020; 21seconds with elevated rollator 7/22, 8/23: 13.62 sec with up and go walker, 01/09/21: 15.81s with hiking stick 3/3: 17.98 seconds one near LOB 3/31: 15.57 seconds with walking stick 6/20: 14.8 seconds with walking stick 7/12: defer to next session d/t migraine    Time 12    Period Weeks    Status Partially Met    Target Date 08/18/21      PT LONG TERM GOAL #3   Title Patient will increase right UE and LE gross strength to 4+/5 throughout to improve functional strength for independent gait, increased standing tolerance and increased ADL ability.    Baseline grossly +3/5 to -4/5 right UE and LE strength on 05/21/2020; grossly 4-/5 for RUE, grossly 4-/5 for RLE on 7/22, grossly 4-/5 RUE, grossly 4/5 RLE 3/3: see note    Time 8    Period Weeks    Status Partially Met    Target Date 08/18/21      PT LONG TERM GOAL #4   Title Patient will increase 10 meter walk test to >1.92ms as to improve gait speed for better community ambulation and to reduce fall risk.    Baseline on 7/22 .57 m/sself selected, fast  .73 m/s with LOB pt reported R knee buckling, elevated rollator used, 8/23: 1.06 m/s with up and go walker, 01/09/21: 0.72 m/s with hiking stick 3/3: 0.74 m/s with walking stick 3/31: 0.81 m/s w walking stick 6/20: 1.1 m/s with walking stick    Time 12    Period Weeks    Status Achieved      PT LONG TERM GOAL #5   Title Patient will increase Berg Balance score by > 6 points to demonstrate decreased fall risk during functional activities.    Baseline 8/23: 37/56,  09/30/20=40/56, 12/20: 42/56 3/3: deferred 3/31 will perform next time; deferred due to throwing up 6/20: 46/56 7/12 38/56    Time 8    Period Weeks    Status Achieved      PT LONG TERM GOAL #6   Title Patient (< 651years old) will complete five times sit to stand test in < 10 seconds indicating an increased LE strength and improved balance.    Baseline 8/23: 22 sec, 09/30/20=13.04 sec, 12/20: 27.05 sec, 01/09/21: 18.01s 3/3: 23 seconds no UE support 3/31; deferred due to patient throwing up 7/12: 15.2 seconds    Time 12    Period Weeks    Status Partially Met    Target Date 08/18/21      PT LONG TERM GOAL #7   Title Patient will improve 6 min walk test >1000 feet with LRAD for improved gait ability in community.    Baseline 8/23: not tested; 8/30 845, goal adjusted >20050f 09/30/20=600 feet - stopped test due to R ankle catching making gait unsafe, 11/25/20= 330 feet - stopped test due to severe dizziness, vomiting/nausea, sweating making gait unsafe; 01/09/21: 720 ft with no rest breaks 3/3:  deferred due to nausea 3/31: deferred due to patient throwing up 6/20: 640 ft 7/12: defer d/t migraine    Time 12    Period Weeks    Status Partially Met    Target Date 08/18/21      PT LONG TERM GOAL #8   Title Patient will increase Berg Balance score by > 6 points  (52/56) to demonstrate decreased fall risk during functional activities.    Baseline 6/20: 46/56 7/12: 38/56 with migraine    Time 12    Period Weeks    Status On-going    Target Date 08/18/21                   Plan - 06/17/21 1358     Clinical Impression Statement Patient has progressed with FOTO and 5x STS goal. His walking and ABC goals will be assessed next session as today his migraine was too high to safely perform. His migraine did affect his stability as can be seen in his BERG score. Patient remains highly motivated despite migraine. Will see this patient once he returns from his trip. Patient's condition has the  potential to improve in response to therapy. Maximum improvement is yet to be obtained. The anticipated improvement is attainable and reasonable in a generally predictable time. The patient continues to benefit from additional skilled PT services to improve LE strength and balance for decreased fall risk and improved quality of life    Personal Factors and Comorbidities Comorbidity 3+;Time since onset of injury/illness/exacerbation    Comorbidities anxiety, back pain, COPD, headache, HTN, MI, sleep apnea, migraines    Examination-Activity Limitations Locomotion Level;Transfers;Bed Mobility;Stand;Stairs;Sleep;Squat;Bend    Examination-Participation Restrictions Driving;Medication Management    Stability/Clinical Decision Making Unstable/Unpredictable    Rehab Potential Fair    PT Frequency 2x / week    PT Duration 12 weeks    PT Treatment/Interventions ADLs/Self Care Home Management;Canalith Repostioning;Moist Heat;Electrical Stimulation;Gait training;Stair training;Functional mobility training;Therapeutic activities;Therapeutic exercise;Balance training;Neuromuscular re-education;Patient/family education;Manual techniques;Passive range of motion;Vestibular    PT Next Visit Plan update HEP ea session for continued strengthening/balanceing at home    PT [place] Access Code: [email or web address] updated this session    Consulted and Agree with Plan of Care Patient;Other (Comment)   Friend            Patient will benefit from skilled therapeutic intervention in order to improve the following deficits and impairments:  Decreased activity tolerance, Decreased balance, Decreased cognition, Decreased mobility, Difficulty walking, Abnormal gait, Decreased range of motion, Dizziness, Pain, Impaired flexibility, Decreased strength, Impaired sensation, Decreased endurance, Decreased safety awareness  Visit Diagnosis: Muscle weakness (generalized)  Unsteadiness on feet  Dizziness and  giddiness     Problem List Patient Active Problem List   Diagnosis Date Noted   Degenerative spondylolisthesis 03/25/2020   Physical deconditioning 03/04/2020   Lobar pneumonia, unspecified organism ([place]) 03/04/2020   Intractable hemiplegic migraine with status migrainosus 03/02/2020   Current tobacco use 02/05/2020   Abnormal findings on diagnostic imaging of lung 02/05/2020   Shortness of breath 02/05/2020   Herniated disc, cervical 01/23/2020   Cervical spondylosis with myelopathy and radiculopathy 01/23/2020   Pre-operative respiratory examination 01/12/2020   Weakness on right side of face 12/22/2019   Osteoarthritis of spine with radiculopathy, lumbar region 12/11/2019   Intractable migraine with aura with status migrainosus 11/09/2019   Former smoker 08/27/2018   Benign essential HTN 08/27/2018   Cough productive of purulent sputum 11/08/2017   GERD (gastroesophageal reflux disease)  05/06/2017   Contusion of hand 04/22/2017   BP (high blood pressure) 04/22/2017   Oxygen desaturation 04/22/2017   Prostate lump 04/22/2017   Unstable angina ([place]) 04/27/2016   Chest pain 04/24/2016   Morbid obesity ([place]) 01/29/2016   Elevated ALT measurement 10/30/2015   Nonspecific elevation of levels of transaminase and lactic acid dehydrogenase (ldh) 10/30/2015   Reduced libido 10/29/2015   Hyperlipidemia 08/06/2015   Anxiety 08/06/2015   Atherosclerosis of coronary artery 08/06/2015   Childhood asthma 08/06/2015   Chronic obstructive pulmonary disease ([place]) 08/06/2015   OSA (obstructive sleep apnea) 08/06/2015   Anxiety disorder 08/06/2015   Atherosclerosis of native coronary artery of native heart with stable angina pectoris ([place]) 61/90/1222   Uncomplicated asthma 41/14/6431    Janna Arch, PT, DPT  06/17/2021, 1:59 PM  [place] [facility] MAIN [facility] SERVICES [place], [place], [address] Phone: [phone]   Fax:   [phone]  Name: Cory Espinoza MRN: [id number] Date of Birth: 1971-03-09

## 2021-06-19 ENCOUNTER — Ambulatory Visit: Payer: BC Managed Care – PPO

## 2021-06-23 ENCOUNTER — Ambulatory Visit: Payer: BC Managed Care – PPO

## 2021-06-26 ENCOUNTER — Ambulatory Visit: Payer: BC Managed Care – PPO

## 2021-06-30 ENCOUNTER — Ambulatory Visit: Payer: BC Managed Care – PPO | Admitting: Physical Therapy

## 2021-06-30 ENCOUNTER — Other Ambulatory Visit: Payer: Self-pay

## 2021-06-30 DIAGNOSIS — R42 Dizziness and giddiness: Secondary | ICD-10-CM

## 2021-06-30 DIAGNOSIS — M6281 Muscle weakness (generalized): Secondary | ICD-10-CM

## 2021-06-30 DIAGNOSIS — R2681 Unsteadiness on feet: Secondary | ICD-10-CM

## 2021-06-30 NOTE — Therapy (Signed)
[place] MAIN [facility] SERVICES [address] [place], [place], [address] Phone: [phone]   Fax:  [phone]  Physical Therapy Treatment  Patient Details  Name: Cory Espinoza MRN: [id number] Date of Birth: [birth year] Referring Provider (PT): Dr. Joselyn Arrow   Encounter Date: 06/30/2021   PT End of Session - 06/30/21 1745     Visit Number 55    Number of Visits 31    Date for PT Re-Evaluation 08/18/21    Authorization Type [employer] PPO; next session 1/10 PN 7/12    Authorization Time Period 01/09/21-04/03/21    PT Start Time 1515    PT Stop Time 1600    PT Time Calculation (min) 45 min    Equipment Utilized During Treatment Gait belt    Activity Tolerance Patient tolerated treatment well    Behavior During Therapy [facility] for tasks assessed/performed             Past Medical History:  Diagnosis Date   Allergy    Anginal pain ([place])    Anxiety    Arthritis    lumbar spine   Asthma    Atrial fibrillation (HCC)    Back pain    Severe Lumbar Pain   CHF (congestive heart failure) (HCC)    COPD (chronic obstructive pulmonary disease) ([place])    Restrictive lung disease   Coronary artery disease    Dyspnea    Dysrhythmia    afib   GERD (gastroesophageal reflux disease)    Headache    [name], onset October 2020, cluster headaches in the past   History of kidney stones    Hyperlipidemia    Hypertension    Myocardial infarction ([place])    at age 28   Pneumonia    Restrictive lung disease    Sleep apnea    unable to use cpap since onset of migraines    Past Surgical History:  Procedure Laterality Date   ABDOMINAL EXPOSURE N/A 03/25/2020   Procedure: ABDOMINAL EXPOSURE;  Surgeon: Rosetta Posner, MD;  Location: [facility] OR;  Service: Vascular;  Laterality: N/A;   ANTERIOR CERVICAL DECOMP/DISCECTOMY FUSION N/A 01/23/2020   Procedure: ANTERIOR CERVICAL DECOMPRESSION FUSION - CERVICAL SIX-CERVICAL SEVEN;  Surgeon: Earnie Larsson, MD;  Location:  [place];  Service: Neurosurgery;  Laterality: N/A;   ANTERIOR LUMBAR FUSION N/A 03/25/2020   Procedure: ANTERIOR LUMBAR INTERBODY FUSION LUMBAR FIVE-SACRAL ONE.;  Surgeon: Earnie Larsson, MD;  Location: [place];  Service: Neurosurgery;  Laterality: N/A;  anterior   BACK SURGERY  1996   CARDIAC CATHETERIZATION Left 04/27/2016   Procedure: Left Heart Cath and Coronary Angiography;  Surgeon: Corey Skains, MD;  Location: [place] CV LAB;  Service: Cardiovascular;  Laterality: Left;   CARDIAC CATHETERIZATION N/A 04/27/2016   Procedure: Intravascular Pressure Wire/FFR Study;  Surgeon: Yolonda Kida, MD;  Location: [place] CV LAB;  Service: Cardiovascular;  Laterality: N/A;   CATHETERIZATION OF PULMONARY ARTERY WITH RETRIEVAL OF FOREIGN BODY Bilateral 04/07/2011   heart   DEGENERATIVE SPONDYLOLISTHESIS     KNEE ARTHROSCOPY Bilateral 2004   LUMBAR DISC SURGERY  1999   RADICULOPATHY, CERVICAL REGION     TONSILLECTOMY      There were no vitals filed for this visit.   Subjective Assessment - 06/30/21 1716     Subjective Patient reports he has a mild headache. 5/10 pain in BLE with increased numbness and tingling in the RLE. Wife present for session.    Patient is  accompained by: Family member    Pertinent History Pt has a complex medical history. He reports that he fell during a [employer] training in [place] in the fall of 2020. He states that he fell 10-12 feet and struck his head, neck, and back. He was able to finish the course which took him approximately 45 minutes more to complete. He does endorse a second fall while finishing the course but did not suffer a second head injury. He states that he started getting headaches that day which have persisted since that time. 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 He does report improvement in his headaches recently. Neurology was concerned about possible BPPV and have  referred him for a vestibular evaluation. In addition since the injury, pt developed RUE and RLE pain and weakness. Pt states that NCV showed abnormal nerve conduction in RUE. He has since undergone a C7-7 ACDF on 01/23/20. He reports initial improvement in RUE strength, but states that he has a gradual return of his RUE weakness. He was also having significant RLE weakness and pain and underwent and L5-S1 anterior lumbar interbody fusion on 03/25/20. Patient reports that he does not have any back precautions but needs to wear the low back brace for one more month. Patient reports he has a follow-up appointment with the surgeon next month. Patient reports he has a follow-up appointment with Dr. Brigitte Pulse, neurologist, in August. He arrives to therapy ambulating with an upright rollator walker. Pt denies any mention of concussion after his injury. He has been having cognitive issues since his injury and has previously had a heavy metals screen as well as neurocognitive testing. He has had an MRI of his brain with and without contrast on 12/18/19. Results were mildly motion degraded examination. No evidence of acute intracranial abnormality. Minimal chronic small vessel ischemic disease. He has tremor in both his hands. He has a positive family history of Parkinson's disease in his grandfather. He is complaining of constant dizziness and unsteadiness which are worse with activity. Patient states that he frequently loses his balance and his wife has to steady him.    Limitations Lifting;Standing;Walking;House hold activities    Diagnostic tests He has had an MRI of his brain with and without contrast on 12/18/19. Results were mildly motion degraded examination. No evidence of acute intracranial abnormality. Minimal chronic small vessel ischemic disease.    Patient Stated Goals to be able to ambulate with a cane or no AD, to be able to drive, to be able to ride his motorcycle    Currently in Pain? Yes    Pain Score 5      Pain Location Leg    Pain Orientation Right;Left    Pain Descriptors / Indicators Aching    Pain Type Chronic pain;Neuropathic pain    Pain Onset More than a month ago             Goals: (were not assessed last session due to migraine) TUG-19.72s (increased time to turn) ABC-40% 6 min- 634f (increased time at each turn)    Treatment:    TherEx Seated:  GTB: abduction 15x each LE GTB hamstring curl 15x each LE GTB around bilateral ankles: alternating IR 15x GTB around bilateral ankles: LAQ 15x each LE        Additional outcome measures were assessed today. Pt progressed 6MWT distance by [id number] to last assessment. He required MIN A on multiple  occasions due to LOB during and after turning. Prolonged rest break was required after 6MWT due to nausea. Pt then asked to complete therapy in a dark environment as exertion caused migraine to increase. TUG time increased; pt required increased time to steady himself after turning. Session was completed with seated LE strengthening exercises. The patient continues to benefit from additional skilled PT services to improve LE strength and balance for decreased fall risk and improved quality of life.           PT Short Term Goals - 06/30/21 1746       PT SHORT TERM GOAL #1   Title Patient will be independent in home exercise program to improve strength/mobility for better functional independence with ADLs and for self-management.    Baseline 3/3 HEP compliant 3/31 hep compliant    Time 6    Period Weeks    Status Achieved    Target Date 02/20/21      PT SHORT TERM GOAL #2   Title Patient will be modified independent in walking on even/uneven surface with least restrictive assistive device, for 10+ minutes without rest break, reporting some difficulty or less to improve walking tolerance with community ambulation including grocery shopping, going to church,etc.    Baseline 02/03: instability, LOB multiple times while  ambulating to elevator 3/31: unable to test due to throwing up 6/20: 6 minutes 7/12: unable to test due to migraine    Time 6    Period Weeks    Status Partially Met    Target Date 07/07/21      PT SHORT TERM GOAL #3   Title Patient will increase ABC scale score >80% to demonstrate better functional mobility and better confidence with ADLs.    Baseline 01/09/21: 60.625% 3/3/: 50% 3/31: 63% 6/20: 45% 7/25: 40%    Time 6    Period Weeks    Status On-going    Target Date 07/07/21               PT Long Term Goals - 06/30/21 1747       PT LONG TERM GOAL #1   Title Patient will increase FOTO score to equal to or greater than 65 to demonstrate statistically significant improvement in mobility and quality of life.    Baseline scored 41/100 on 05/21/2020; 7/22 40, 8/23: 60/100, 09/30/20=40, 01/09/21: 46 3/3: 40.3% 3/31: 49% 6/20: 45% 7/12: 50.7%    Time 12    Period Weeks    Status Partially Met      PT LONG TERM GOAL #2   Title Patient will reduce falls risk as indicated by decreased TUG time to less than 11 seconds.    Baseline scored 37.9 sec with TUG on 05/21/2020; 21seconds with elevated rollator 7/22, 8/23: 13.62 sec with up and go walker, 01/09/21: 15.81s with hiking stick 3/3: 17.98 seconds one near LOB 3/31: 15.57 seconds with walking stick 6/20: 14.8 seconds with walking stick. 7/25: 19.72 seconds with walking stick.    Time 12    Period Weeks    Status Partially Met      PT LONG TERM GOAL #3   Title Patient will increase right UE and LE gross strength to 4+/5 throughout to improve functional strength for independent gait, increased standing tolerance and increased ADL ability.    Baseline grossly +3/5 to -4/5 right UE and LE strength on 05/21/2020; grossly 4-/5 for RUE, grossly 4-/5 for RLE on 7/22, grossly 4-/5 RUE, grossly 4/5 RLE 3/3: see note  Time 8    Period Weeks    Status Partially Met      PT LONG TERM GOAL #4   Title Patient will increase 10 meter walk test to  >1.82ms as to improve gait speed for better community ambulation and to reduce fall risk.    Baseline on 7/22 .57 m/sself selected, fast  .73 m/s with LOB pt reported R knee buckling, elevated rollator used, 8/23: 1.06 m/s with up and go walker, 01/09/21: 0.72 m/s with hiking stick 3/3: 0.74 m/s with walking stick 3/31: 0.81 m/s w walking stick 6/20: 1.1 m/s with walking stick    Time 12    Period Weeks    Status Achieved      PT LONG TERM GOAL #5   Title Patient will increase Berg Balance score by > 6 points to demonstrate decreased fall risk during functional activities.    Baseline 8/23: 37/56, 09/30/20=40/56, 12/20: 42/56 3/3: deferred 3/31 will perform next time; deferred due to throwing up 6/20: 46/56 7/12 38/56    Time 8    Period Weeks    Status Achieved      PT LONG TERM GOAL #6   Title Patient (< 654years old) will complete five times sit to stand test in < 10 seconds indicating an increased LE strength and improved balance.    Baseline 8/23: 22 sec, 09/30/20=13.04 sec, 12/20: 27.05 sec, 01/09/21: 18.01s 3/3: 23 seconds no UE support 3/31; deferred due to patient throwing up 7/12: 15.2 seconds    Time 12    Period Weeks    Status Partially Met      PT LONG TERM GOAL #7   Title Patient will improve 6 min walk test >1000 feet with LRAD for improved gait ability in community.    Baseline 8/23: not tested; 8/30 845, goal adjusted >20067f 09/30/20=600 feet - stopped test due to R ankle catching making gait unsafe, 11/25/20= 330 feet - stopped test due to severe dizziness, vomiting/nausea, sweating making gait unsafe; 01/09/21: 720 ft with no rest breaks 3/3: deferred due to nausea 3/31: deferred due to patient throwing up 6/20: 640 ft 7/12: [id number] walking stick, no rest breaks;    Time 12    Period Weeks    Status Partially Met      PT LONG TERM GOAL #8   Title Patient will increase Berg Balance score by > 6 points  (52/56) to demonstrate decreased fall risk during functional  activities.    Baseline 6/20: 46/56 7/12: 38/56 with migraine    Time 12    Period Weeks    Status On-going                   Plan - 06/30/21 1744     Clinical Impression Statement Additional outcome measures were assessed today. Pt progressed 6MWT distance by [id number] to last assessment. He required MIN A on multiple occasions due to LOB during and after turning. Prolonged rest break was required after 6MWT due to nausea. Pt then asked to complete therapy in a dark environment as exertion caused migraine to increase. TUG time increased; pt required increased time to steady himself after turning. Session was completed with seated LE strengthening exercises. The patient continues to benefit from additional skilled PT services to improve LE strength and balance for decreased fall risk and improved quality of life.    Personal Factors and Comorbidities Comorbidity 3+;Time since onset of injury/illness/exacerbation    Comorbidities anxiety, back pain,  COPD, headache, HTN, MI, sleep apnea, migraines    Examination-Activity Limitations Locomotion Level;Transfers;Bed Mobility;Stand;Stairs;Sleep;Squat;Bend    Examination-Participation Restrictions Driving;Medication Management    Stability/Clinical Decision Making Unstable/Unpredictable    Rehab Potential Fair    PT Frequency 2x / week    PT Duration 12 weeks    PT Treatment/Interventions ADLs/Self Care Home Management;Canalith Repostioning;Moist Heat;Electrical Stimulation;Gait training;Stair training;Functional mobility training;Therapeutic activities;Therapeutic exercise;Balance training;Neuromuscular re-education;Patient/family education;Manual techniques;Passive range of motion;Vestibular    PT Next Visit Plan update HEP ea session for continued strengthening/balanceing at home    PT [place] Access Code: [email or web address] updated this session    Consulted and Agree with Plan of Care Patient;Other (Comment)   Friend             Patient will benefit from skilled therapeutic intervention in order to improve the following deficits and impairments:  Decreased activity tolerance, Decreased balance, Decreased cognition, Decreased mobility, Difficulty walking, Abnormal gait, Decreased range of motion, Dizziness, Pain, Impaired flexibility, Decreased strength, Impaired sensation, Decreased endurance, Decreased safety awareness  Visit Diagnosis: Muscle weakness (generalized)  Unsteadiness on feet  Dizziness and giddiness     Problem List Patient Active Problem List   Diagnosis Date Noted   Degenerative spondylolisthesis 03/25/2020   Physical deconditioning 03/04/2020   Lobar pneumonia, unspecified organism ([place]) 03/04/2020   Intractable hemiplegic migraine with status migrainosus 03/02/2020   Current tobacco use 02/05/2020   Abnormal findings on diagnostic imaging of lung 02/05/2020   Shortness of breath 02/05/2020   Herniated disc, cervical 01/23/2020   Cervical spondylosis with myelopathy and radiculopathy 01/23/2020   Pre-operative respiratory examination 01/12/2020   Weakness on right side of face 12/22/2019   Osteoarthritis of spine with radiculopathy, lumbar region 12/11/2019   Intractable migraine with aura with status migrainosus 11/09/2019   Former smoker 08/27/2018   Benign essential HTN 08/27/2018   Cough productive of purulent sputum 11/08/2017   GERD (gastroesophageal reflux disease) 05/06/2017   Contusion of hand 04/22/2017   BP (high blood pressure) 04/22/2017   Oxygen desaturation 04/22/2017   Prostate lump 04/22/2017   Unstable angina ([place]) 04/27/2016   Chest pain 04/24/2016   Morbid obesity ([place]) 01/29/2016   Elevated ALT measurement 10/30/2015   Nonspecific elevation of levels of transaminase and lactic acid dehydrogenase (ldh) 10/30/2015   Reduced libido 10/29/2015   Hyperlipidemia 08/06/2015   Anxiety 08/06/2015   Atherosclerosis of coronary artery 08/06/2015    Childhood asthma 08/06/2015   Chronic obstructive pulmonary disease ([place]) 08/06/2015   OSA (obstructive sleep apnea) 08/06/2015   Anxiety disorder 08/06/2015   Atherosclerosis of native coronary artery of native heart with stable angina pectoris ([place]) 99/35/7017   Uncomplicated asthma 79/39/0300    [facility] 06/30/2021, 5:50 PM  [place] MAIN [facility] SERVICES [address] [place], [place], [address] Phone: [phone]   Fax:  [phone]  Name: Cory Espinoza MRN: [id number] Date of Birth: 1970-12-18

## 2021-07-03 ENCOUNTER — Ambulatory Visit: Payer: BC Managed Care – PPO

## 2021-07-07 ENCOUNTER — Ambulatory Visit: Payer: BC Managed Care – PPO | Attending: Neurology

## 2021-07-07 ENCOUNTER — Other Ambulatory Visit: Payer: Self-pay

## 2021-07-07 DIAGNOSIS — R42 Dizziness and giddiness: Secondary | ICD-10-CM | POA: Diagnosis present

## 2021-07-07 DIAGNOSIS — R2681 Unsteadiness on feet: Secondary | ICD-10-CM | POA: Diagnosis present

## 2021-07-07 DIAGNOSIS — M6281 Muscle weakness (generalized): Secondary | ICD-10-CM | POA: Insufficient documentation

## 2021-07-07 NOTE — Therapy (Signed)
[place] MAIN [facility] SERVICES [address] [place], [place], [address] Phone: [phone]   Fax:  [phone]  Physical Therapy Treatment  Patient Details  Name: Cory Espinoza MRN: [id number] Date of Birth: June 26, 1971 Referring Provider (PT): Dr. Joselyn Arrow   Encounter Date: 07/07/2021   PT End of Session - 07/07/21 1647     Visit Number 47    Number of Visits 24    Date for PT Re-Evaluation 08/18/21    Authorization Type [employer] PPO; next session 2/10 PN 7/12    Authorization Time Period 01/09/21-04/03/21    PT Start Time 1515    PT Stop Time 1605    PT Time Calculation (min) 50 min    Equipment Utilized During Treatment Gait belt    Activity Tolerance Patient tolerated treatment well    Behavior During Therapy [facility] for tasks assessed/performed             Past Medical History:  Diagnosis Date   Allergy    Anginal pain ([place])    Anxiety    Arthritis    lumbar spine   Asthma    Atrial fibrillation (HCC)    Back pain    Severe Lumbar Pain   CHF (congestive heart failure) (HCC)    COPD (chronic obstructive pulmonary disease) ([place])    Restrictive lung disease   Coronary artery disease    Dyspnea    Dysrhythmia    afib   GERD (gastroesophageal reflux disease)    Headache    [name], onset October 2020, cluster headaches in the past   History of kidney stones    Hyperlipidemia    Hypertension    Myocardial infarction ([place])    at age 89   Pneumonia    Restrictive lung disease    Sleep apnea    unable to use cpap since onset of migraines    Past Surgical History:  Procedure Laterality Date   ABDOMINAL EXPOSURE N/A 03/25/2020   Procedure: ABDOMINAL EXPOSURE;  Surgeon: Rosetta Posner, MD;  Location: [facility] OR;  Service: Vascular;  Laterality: N/A;   ANTERIOR CERVICAL DECOMP/DISCECTOMY FUSION N/A 01/23/2020   Procedure: ANTERIOR CERVICAL DECOMPRESSION FUSION - CERVICAL SIX-CERVICAL SEVEN;  Surgeon: Earnie Larsson, MD;  Location:  [place];  Service: Neurosurgery;  Laterality: N/A;   ANTERIOR LUMBAR FUSION N/A 03/25/2020   Procedure: ANTERIOR LUMBAR INTERBODY FUSION LUMBAR FIVE-SACRAL ONE.;  Surgeon: Earnie Larsson, MD;  Location: [place];  Service: Neurosurgery;  Laterality: N/A;  anterior   BACK SURGERY  1996   CARDIAC CATHETERIZATION Left 04/27/2016   Procedure: Left Heart Cath and Coronary Angiography;  Surgeon: Corey Skains, MD;  Location: [place] CV LAB;  Service: Cardiovascular;  Laterality: Left;   CARDIAC CATHETERIZATION N/A 04/27/2016   Procedure: Intravascular Pressure Wire/FFR Study;  Surgeon: Yolonda Kida, MD;  Location: [place] CV LAB;  Service: Cardiovascular;  Laterality: N/A;   CATHETERIZATION OF PULMONARY ARTERY WITH RETRIEVAL OF FOREIGN BODY Bilateral 04/07/2011   heart   DEGENERATIVE SPONDYLOLISTHESIS     KNEE ARTHROSCOPY Bilateral 2004   LUMBAR DISC SURGERY  1999   RADICULOPATHY, CERVICAL REGION     TONSILLECTOMY      There were no vitals filed for this visit.   Subjective Assessment - 07/07/21 1645     Subjective Patient reports he overdid it last session and missed the following session due to that and the weather. Wife is present for session today.    Patient  is accompained by: Family member    Pertinent History Pt has a complex medical history. 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 He states that he fell 10-12 feet and struck his head, neck, and back. He was able to finish the course which took him approximately 45 minutes more to complete. He does endorse a second fall while finishing the course but did not suffer a second head injury. He states that he started getting headaches that day which have persisted since that time. 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 He does report improvement in his headaches recently. Neurology was concerned about possible BPPV and have  referred him for a vestibular evaluation. In addition since the injury, pt developed RUE and RLE pain and weakness. Pt states that NCV showed abnormal nerve conduction in RUE. He has since undergone a C7-7 ACDF on 01/23/20. He reports initial improvement in RUE strength, but states that he has a gradual return of his RUE weakness. He was also having significant RLE weakness and pain and underwent and L5-S1 anterior lumbar interbody fusion on 03/25/20. Patient reports that he does not have any back precautions but needs to wear the low back brace for one more month. Patient reports he has a follow-up appointment with the surgeon next month. Patient reports he has a follow-up appointment with Dr. Brigitte Pulse, neurologist, in August. He arrives to therapy ambulating with an upright rollator walker. Pt denies any mention of concussion after his injury. He has been having cognitive issues since his injury and has previously had a heavy metals screen as well as neurocognitive testing. He has had an MRI of his brain with and without contrast on 12/18/19. Results were mildly motion degraded examination. No evidence of acute intracranial abnormality. Minimal chronic small vessel ischemic disease. He has tremor in both his hands. He has a positive family history of Parkinson's disease in his grandfather. He is complaining of constant dizziness and unsteadiness which are worse with activity. Patient states that he frequently loses his balance and his wife has to steady him.    Limitations Lifting;Standing;Walking;House hold activities    Diagnostic tests He has had an MRI of his brain with and without contrast on 12/18/19. Results were mildly motion degraded examination. No evidence of acute intracranial abnormality. Minimal chronic small vessel ischemic disease.    Patient Stated Goals to be able to ambulate with a cane or no AD, to be able to drive, to be able to ride his motorcycle    Currently in Pain? Yes    Pain Score 9      Pain Location Head    Pain Descriptors / Indicators Throbbing;Headache    Pain Type Chronic pain;Neuropathic pain    Pain Onset More than a month ago    Pain Frequency Constant                     Treatment:    in room with lights off and noise damped due to migraine.    TherEx Seated:  4lb weight:cues for body mechanics sequencing with tactile cue for full arc of motion -LAQ 15x each LE: very fatiguing; 3 second holds  -March 15x each LE -IR/ER 15x  -heel toe raise 15x  -2" step taps alternating LE's 10x each LE   Weighted ball: (3000 gr) -chest press with cue to look at wall  to decrease episodes of dizziness 10x -straight arm raise with tactile cue for scapular retraction and core activation 10x; very challenging -overhead hold with alternating leg kick outs 10x each LE  RTB: -adduction against PT resistance 15x each LE -hamstring curl against PT resistance 10x each LE  -bilateral arm row 15x  Ambulate 190 ft with walking stick and close CGA, multiple episodes of R knee buckling requiring PT to assist patient to remain standing.   Manual:  STM to bilateral upper traps, levator scap, and cervical paraspinals x 13 minutes, implementation of effleurage and ptrissage included.  patient reports decreased headache pain by end of session.      Pt educated throughout session about proper posture and technique with exercises. Improved exercise technique, movement at target joints, use of target muscles after min to mod verbal, visual, tactile cues    Patient remains highly motivated for progression despite headache this session. He is able to tolerate increased weight for LE strengthening interventions. Core stabilization interventions in seated performed with no increase of dizziness this session. Patient's headache did increase a little by end of session requiring PT to assist patient back upstairs to car. The patient continues to benefit from additional skilled PT  services to improve LE strength and balance for decreased fall risk and improved quality of life.                PT Education - 07/07/21 1646     Education Details exercise technique, body mechanics    Person(s) Educated Patient    Methods Explanation;Demonstration;Tactile cues;Verbal cues    Comprehension Verbalized understanding;Returned demonstration;Verbal cues required;Tactile cues required              PT Short Term Goals - 06/30/21 1746       PT SHORT TERM GOAL #1   Title Patient will be independent in home exercise program to improve strength/mobility for better functional independence with ADLs and for self-management.    Baseline 3/3 HEP compliant 3/31 hep compliant    Time 6    Period Weeks    Status Achieved    Target Date 02/20/21      PT SHORT TERM GOAL #2   Title Patient will be modified independent in walking on even/uneven surface with least restrictive assistive device, for 10+ minutes without rest break, reporting some difficulty or less to improve walking tolerance with community ambulation including grocery shopping, going to church,etc.    Baseline 02/03: instability, LOB multiple times while ambulating to elevator 3/31: unable to test due to throwing up 6/20: 6 minutes 7/12: unable to test due to migraine    Time 6    Period Weeks    Status Partially Met    Target Date 07/07/21      PT SHORT TERM GOAL #3   Title Patient will increase ABC scale score >80% to demonstrate better functional mobility and better confidence with ADLs.    Baseline 01/09/21: 60.625% 3/3/: 50% 3/31: 63% 6/20: 45% 7/25: 40%    Time 6    Period Weeks    Status On-going    Target Date 07/07/21               PT Long Term Goals - 06/30/21 1747       PT LONG TERM GOAL #1   Title Patient will increase FOTO score to equal to or greater than 65 to demonstrate statistically significant improvement in mobility and quality of life.    Baseline scored 41/100  on  05/21/2020; 7/22 40, 8/23: 60/100, 09/30/20=40, 01/09/21: 46 3/3: 40.3% 3/31: 49% 6/20: 45% 7/12: 50.7%    Time 12    Period Weeks    Status Partially Met      PT LONG TERM GOAL #2   Title Patient will reduce falls risk as indicated by decreased TUG time to less than 11 seconds.    Baseline scored 37.9 sec with TUG on 05/21/2020; 21seconds with elevated rollator 7/22, 8/23: 13.62 sec with up and go walker, 01/09/21: 15.81s with hiking stick 3/3: 17.98 seconds one near LOB 3/31: 15.57 seconds with walking stick 6/20: 14.8 seconds with walking stick. 7/25: 19.72 seconds with walking stick.    Time 12    Period Weeks    Status Partially Met      PT LONG TERM GOAL #3   Title Patient will increase right UE and LE gross strength to 4+/5 throughout to improve functional strength for independent gait, increased standing tolerance and increased ADL ability.    Baseline grossly +3/5 to -4/5 right UE and LE strength on 05/21/2020; grossly 4-/5 for RUE, grossly 4-/5 for RLE on 7/22, grossly 4-/5 RUE, grossly 4/5 RLE 3/3: see note    Time 8    Period Weeks    Status Partially Met      PT LONG TERM GOAL #4   Title Patient will increase 10 meter walk test to >1.1ms as to improve gait speed for better community ambulation and to reduce fall risk.    Baseline on 7/22 .57 m/sself selected, fast  .73 m/s with LOB pt reported R knee buckling, elevated rollator used, 8/23: 1.06 m/s with up and go walker, 01/09/21: 0.72 m/s with hiking stick 3/3: 0.74 m/s with walking stick 3/31: 0.81 m/s w walking stick 6/20: 1.1 m/s with walking stick    Time 12    Period Weeks    Status Achieved      PT LONG TERM GOAL #5   Title Patient will increase Berg Balance score by > 6 points to demonstrate decreased fall risk during functional activities.    Baseline 8/23: 37/56, 09/30/20=40/56, 12/20: 42/56 3/3: deferred 3/31 will perform next time; deferred due to throwing up 6/20: 46/56 7/12 38/56    Time 8    Period Weeks     Status Achieved      PT LONG TERM GOAL #6   Title Patient (< 638years old) will complete five times sit to stand test in < 10 seconds indicating an increased LE strength and improved balance.    Baseline 8/23: 22 sec, 09/30/20=13.04 sec, 12/20: 27.05 sec, 01/09/21: 18.01s 3/3: 23 seconds no UE support 3/31; deferred due to patient throwing up 7/12: 15.2 seconds    Time 12    Period Weeks    Status Partially Met      PT LONG TERM GOAL #7   Title Patient will improve 6 min walk test >1000 feet with LRAD for improved gait ability in community.    Baseline 8/23: not tested; 8/30 845, goal adjusted >20050f 09/30/20=600 feet - stopped test due to R ankle catching making gait unsafe, 11/25/20= 330 feet - stopped test due to severe dizziness, vomiting/nausea, sweating making gait unsafe; 01/09/21: 720 ft with no rest breaks 3/3: deferred due to nausea 3/31: deferred due to patient throwing up 6/20: 640 ft 7/12: 66030fith walking stick, no rest breaks;    Time 12    Period Weeks    Status Partially Met  PT LONG TERM GOAL #8   Title Patient will increase Berg Balance score by > 6 points  (52/56) to demonstrate decreased fall risk during functional activities.    Baseline 6/20: 46/56 7/12: 38/56 with migraine    Time 12    Period Weeks    Status On-going                   Plan - 07/07/21 1651     Clinical Impression Statement Patient remains highly motivated for progression despite headache this session. He is able to tolerate increased weight for LE strengthening interventions. Core stabilization interventions in seated performed with no increase of dizziness this session. Patient's headache did increase a little by end of session requiring PT to assist patient back upstairs to car. The patient continues to benefit from additional skilled PT services to improve LE strength and balance for decreased fall risk and improved quality of life.    Personal Factors and Comorbidities  Comorbidity 3+;Time since onset of injury/illness/exacerbation    Comorbidities anxiety, back pain, COPD, headache, HTN, MI, sleep apnea, migraines    Examination-Activity Limitations Locomotion Level;Transfers;Bed Mobility;Stand;Stairs;Sleep;Squat;Bend    Examination-Participation Restrictions Driving;Medication Management    Stability/Clinical Decision Making Unstable/Unpredictable    Rehab Potential Fair    PT Frequency 2x / week    PT Duration 12 weeks    PT Treatment/Interventions ADLs/Self Care Home Management;Canalith Repostioning;Moist Heat;Electrical Stimulation;Gait training;Stair training;Functional mobility training;Therapeutic activities;Therapeutic exercise;Balance training;Neuromuscular re-education;Patient/family education;Manual techniques;Passive range of motion;Vestibular    PT Next Visit Plan update HEP ea session for continued strengthening/balanceing at home    PT [place] Access Code: [email or web address] updated this session    Consulted and Agree with Plan of Care Patient;Other (Comment)   Friend            Patient will benefit from skilled therapeutic intervention in order to improve the following deficits and impairments:  Decreased activity tolerance, Decreased balance, Decreased cognition, Decreased mobility, Difficulty walking, Abnormal gait, Decreased range of motion, Dizziness, Pain, Impaired flexibility, Decreased strength, Impaired sensation, Decreased endurance, Decreased safety awareness  Visit Diagnosis: Muscle weakness (generalized)  Unsteadiness on feet  Dizziness and giddiness     Problem List Patient Active Problem List   Diagnosis Date Noted   Degenerative spondylolisthesis 03/25/2020   Physical deconditioning 03/04/2020   Lobar pneumonia, unspecified organism ([place]) 03/04/2020   Intractable hemiplegic migraine with status migrainosus 03/02/2020   Current tobacco use 02/05/2020   Abnormal findings on diagnostic imaging of lung  02/05/2020   Shortness of breath 02/05/2020   Herniated disc, cervical 01/23/2020   Cervical spondylosis with myelopathy and radiculopathy 01/23/2020   Pre-operative respiratory examination 01/12/2020   Weakness on right side of face 12/22/2019   Osteoarthritis of spine with radiculopathy, lumbar region 12/11/2019   Intractable migraine with aura with status migrainosus 11/09/2019   Former smoker 08/27/2018   Benign essential HTN 08/27/2018   Cough productive of purulent sputum 11/08/2017   GERD (gastroesophageal reflux disease) 05/06/2017   Contusion of hand 04/22/2017   BP (high blood pressure) 04/22/2017   Oxygen desaturation 04/22/2017   Prostate lump 04/22/2017   Unstable angina ([place]) 04/27/2016   Chest pain 04/24/2016   Morbid obesity ([place]) 01/29/2016   Elevated ALT measurement 10/30/2015   Nonspecific elevation of levels of transaminase and lactic acid dehydrogenase (ldh) 10/30/2015   Reduced libido 10/29/2015   Hyperlipidemia 08/06/2015   Anxiety 08/06/2015   Atherosclerosis of coronary artery 08/06/2015   Childhood asthma 08/06/2015  Chronic obstructive pulmonary disease ([place]) 08/06/2015   OSA (obstructive sleep apnea) 08/06/2015   Anxiety disorder 08/06/2015   Atherosclerosis of native coronary artery of native heart with stable angina pectoris ([place]) 96/88/6484   Uncomplicated asthma 72/06/2181   Janna Arch, PT, DPT  07/07/2021, 4:52 PM  [place] MAIN [facility] SERVICES [address] [place], [place], [address] Phone: [phone]   Fax:  [phone]  Name: JAIMIN KRUPKA MRN: [id number] Date of Birth: 1971/01/04

## 2021-07-10 ENCOUNTER — Other Ambulatory Visit: Payer: Self-pay

## 2021-07-10 ENCOUNTER — Ambulatory Visit: Payer: BC Managed Care – PPO

## 2021-07-10 DIAGNOSIS — M6281 Muscle weakness (generalized): Secondary | ICD-10-CM

## 2021-07-10 DIAGNOSIS — R2681 Unsteadiness on feet: Secondary | ICD-10-CM

## 2021-07-10 DIAGNOSIS — R42 Dizziness and giddiness: Secondary | ICD-10-CM

## 2021-07-10 NOTE — Therapy (Signed)
[place] MAIN [facility] SERVICES [address] [place], [place], [address] Phone: [phone]   Fax:  [phone]  Physical Therapy Treatment  Patient Details  Name: Cory Espinoza MRN: [id number] Date of Birth: Aug 16, 1971 Referring Provider (PT): Dr. Joselyn Arrow   Encounter Date: 07/10/2021   PT End of Session - 07/10/21 2001     Visit Number 50    Number of Visits 41    Date for PT Re-Evaluation 08/18/21    Authorization Type [employer] PPO; next session 3/10 PN 7/12    Authorization Time Period 01/09/21-04/03/21    PT Start Time 1515    PT Stop Time 1559    PT Time Calculation (min) 44 min    Equipment Utilized During Treatment Gait belt    Activity Tolerance Patient tolerated treatment well    Behavior During Therapy [facility] for tasks assessed/performed             Past Medical History:  Diagnosis Date   Allergy    Anginal pain (HCC)    Anxiety    Arthritis    lumbar spine   Asthma    Atrial fibrillation (HCC)    Back pain    Severe Lumbar Pain   CHF (congestive heart failure) (HCC)    COPD (chronic obstructive pulmonary disease) ([place])    Restrictive lung disease   Coronary artery disease    Dyspnea    Dysrhythmia    afib   GERD (gastroesophageal reflux disease)    Headache    [name], onset October 2020, cluster headaches in the past   History of kidney stones    Hyperlipidemia    Hypertension    Myocardial infarction ([place])    at age 86   Pneumonia    Restrictive lung disease    Sleep apnea    unable to use cpap since onset of migraines    Past Surgical History:  Procedure Laterality Date   ABDOMINAL EXPOSURE N/A 03/25/2020   Procedure: ABDOMINAL EXPOSURE;  Surgeon: Rosetta Posner, MD;  Location: [facility] OR;  Service: Vascular;  Laterality: N/A;   ANTERIOR CERVICAL DECOMP/DISCECTOMY FUSION N/A 01/23/2020   Procedure: ANTERIOR CERVICAL DECOMPRESSION FUSION - CERVICAL SIX-CERVICAL SEVEN;  Surgeon: Earnie Larsson, MD;  Location:  [place];  Service: Neurosurgery;  Laterality: N/A;   ANTERIOR LUMBAR FUSION N/A 03/25/2020   Procedure: ANTERIOR LUMBAR INTERBODY FUSION LUMBAR FIVE-SACRAL ONE.;  Surgeon: Earnie Larsson, MD;  Location: [place];  Service: Neurosurgery;  Laterality: N/A;  anterior   BACK SURGERY  1996   CARDIAC CATHETERIZATION Left 04/27/2016   Procedure: Left Heart Cath and Coronary Angiography;  Surgeon: Corey Skains, MD;  Location: [place] [facility];  Service: Cardiovascular;  Laterality: Left;   CARDIAC CATHETERIZATION N/A 04/27/2016   Procedure: Intravascular Pressure Wire/FFR Study;  Surgeon: Yolonda Kida, MD;  Location: [place] [facility];  Service: Cardiovascular;  Laterality: N/A;   CATHETERIZATION OF PULMONARY ARTERY WITH RETRIEVAL OF FOREIGN BODY Bilateral 04/07/2011   heart   DEGENERATIVE SPONDYLOLISTHESIS     KNEE ARTHROSCOPY Bilateral 2004   LUMBAR DISC SURGERY  1999   RADICULOPATHY, CERVICAL REGION     TONSILLECTOMY      There were no vitals filed for this visit.   Subjective Assessment - 07/10/21 1959     Subjective Patient reports having back pain and stiffness. No falls since last session.    Patient is accompained by: Family member    Pertinent History Pt has  a complex medical history. 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 He states that he fell 10-12 feet and struck his head, neck, and back. He was able to finish the course which took him approximately 45 minutes more to complete. He does endorse a second fall while finishing the course but did not suffer a second head injury. He states that he started getting headaches that day which have persisted since that time. 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 He does report improvement in his headaches recently. Neurology was concerned about possible BPPV and have referred him for a vestibular evaluation. In addition since  the injury, pt developed RUE and RLE pain and weakness. Pt states that NCV showed abnormal nerve conduction in RUE. He has since undergone a C7-7 ACDF on 01/23/20. He reports initial improvement in RUE strength, but states that he has a gradual return of his RUE weakness. He was also having significant RLE weakness and pain and underwent and L5-S1 anterior lumbar interbody fusion on 03/25/20. Patient reports that he does not have any back precautions but needs to wear the low back brace for one more month. Patient reports he has a follow-up appointment with the surgeon next month. Patient reports he has a follow-up appointment with Dr. Brigitte Pulse, neurologist, in August. He arrives to therapy ambulating with an upright rollator walker. Pt denies any mention of concussion after his injury. He has been having cognitive issues since his injury and has previously had a heavy metals screen as well as neurocognitive testing. He has had an MRI of his brain with and without contrast on 12/18/19. Results were mildly motion degraded examination. No evidence of acute intracranial abnormality. Minimal chronic small vessel ischemic disease. He has tremor in both his hands. He has a positive family history of Parkinson's disease in his grandfather. He is complaining of constant dizziness and unsteadiness which are worse with activity. Patient states that he frequently loses his balance and his wife has to steady him.    Limitations Lifting;Standing;Walking;House hold activities    Diagnostic tests He has had an MRI of his brain with and without contrast on 12/18/19. Results were mildly motion degraded examination. No evidence of acute intracranial abnormality. Minimal chronic small vessel ischemic disease.    Patient Stated Goals to be able to ambulate with a cane or no AD, to be able to drive, to be able to ride his motorcycle    Currently in Pain? Yes    Pain Score 7     Pain Location Back    Pain Orientation Mid;Lower    Pain  Descriptors / Indicators Aching    Pain Onset More than a month ago                   Treatment:  Manual:   Trigger Point Dry Needling (TDN), unbilled Education performed with patient regarding potential benefit of TDN. Reviewed precautions and risks with patient. Reviewed special precautions/risks over lung fields which include pneumothorax. Reviewed signs and symptoms of pneumothorax and advised pt to go to ER immediately if these symptoms develop advise them of dry needling treatment. Extensive time spent with pt to ensure full understanding of TDN risks. Pt provided verbal consent to treatment. TDN performed to  with 0.25 x 40 single needle placements with local twitch response (LTR). Pistoning technique utilized. Improved pain-free motion following intervention.  Muscles involved include: lumbar and thoracic paraspinals, bilateral upper trap x 6 minutes   Grade II-III mobilizations CPA and UPA cervical mobilizations  J mobilization for cervical pain reduction 4 x30 second holds   TherEx GTB adduction 15x each LE GTB hamstring curl 15x each LE  Prone to sitting EOB x 2 assistance, patient became nauseous with patient vomiting.   Ambulate 190 ft with walking stick and close CGA, multiple episodes of R knee buckling requiring PT to assist patient to remain standing.  Car transfer with Min A to RLE to bring into chair    Pt educated throughout session about proper posture and technique with exercises. Improved exercise technique, movement at target joints, use of target muscles after min to mod verbal, visual, tactile cues  Patient reports decreased pain by end of session with decreased cervical tension and low back pain. He is highly motivated throughout session despite nausea from transfer. Ambulation is affected at end of session due to increased nausea. The patient continues to benefit from additional skilled PT services to improve LE strength and balance for decreased fall  risk and improved quality of life.               PT Education - 07/10/21 2000     Education Details exercise technique, body mechanics    Person(s) Educated Patient    Methods Explanation;Demonstration;Tactile cues;Verbal cues    Comprehension Verbalized understanding;Returned demonstration;Verbal cues required;Tactile cues required              PT Short Term Goals - 06/30/21 1746       PT SHORT TERM GOAL #1   Title Patient will be independent in home exercise program to improve strength/mobility for better functional independence with ADLs and for self-management.    Baseline 3/3 HEP compliant 3/31 hep compliant    Time 6    Period Weeks    Status Achieved    Target Date 02/20/21      PT SHORT TERM GOAL #2   Title Patient will be modified independent in walking on even/uneven surface with least restrictive assistive device, for 10+ minutes without rest break, reporting some difficulty or less to improve walking tolerance with community ambulation including grocery shopping, going to church,etc.    Baseline 02/03: instability, LOB multiple times while ambulating to elevator 3/31: unable to test due to throwing up 6/20: 6 minutes 7/12: unable to test due to migraine    Time 6    Period Weeks    Status Partially Met    Target Date 07/07/21      PT SHORT TERM GOAL #3   Title Patient will increase ABC scale score >80% to demonstrate better functional mobility and better confidence with ADLs.    Baseline 01/09/21: 60.625% 3/3/: 50% 3/31: 63% 6/20: 45% 7/25: 40%    Time 6    Period Weeks    Status On-going    Target Date 07/07/21               PT Long Term Goals - 06/30/21 1747       PT LONG TERM GOAL #1   Title Patient will increase FOTO score to equal to or greater than 65 to demonstrate statistically significant improvement in mobility and quality of life.    Baseline scored 41/100 on 05/21/2020; 7/22 40, 8/23: 60/100, 09/30/20=40, 01/09/21: 46 3/3: 40.3%  3/31: 49% 6/20: 45% 7/12: 50.7%    Time 12    Period Weeks    Status  Partially Met      PT LONG TERM GOAL #2   Title Patient will reduce falls risk as indicated by decreased TUG time to less than 11 seconds.    Baseline scored 37.9 sec with TUG on 05/21/2020; 21seconds with elevated rollator 7/22, 8/23: 13.62 sec with up and go walker, 01/09/21: 15.81s with hiking stick 3/3: 17.98 seconds one near LOB 3/31: 15.57 seconds with walking stick 6/20: 14.8 seconds with walking stick. 7/25: 19.72 seconds with walking stick.    Time 12    Period Weeks    Status Partially Met      PT LONG TERM GOAL #3   Title Patient will increase right UE and LE gross strength to 4+/5 throughout to improve functional strength for independent gait, increased standing tolerance and increased ADL ability.    Baseline grossly +3/5 to -4/5 right UE and LE strength on 05/21/2020; grossly 4-/5 for RUE, grossly 4-/5 for RLE on 7/22, grossly 4-/5 RUE, grossly 4/5 RLE 3/3: see note    Time 8    Period Weeks    Status Partially Met      PT LONG TERM GOAL #4   Title Patient will increase 10 meter walk test to >1.29ms as to improve gait speed for better community ambulation and to reduce fall risk.    Baseline on 7/22 .57 m/sself selected, fast  .73 m/s with LOB pt reported R knee buckling, elevated rollator used, 8/23: 1.06 m/s with up and go walker, 01/09/21: 0.72 m/s with hiking stick 3/3: 0.74 m/s with walking stick 3/31: 0.81 m/s w walking stick 6/20: 1.1 m/s with walking stick    Time 12    Period Weeks    Status Achieved      PT LONG TERM GOAL #5   Title Patient will increase Berg Balance score by > 6 points to demonstrate decreased fall risk during functional activities.    Baseline 8/23: 37/56, 09/30/20=40/56, 12/20: 42/56 3/3: deferred 3/31 will perform next time; deferred due to throwing up 6/20: 46/56 7/12 38/56    Time 8    Period Weeks    Status Achieved      PT LONG TERM GOAL #6   Title Patient (< 672years  old) will complete five times sit to stand test in < 10 seconds indicating an increased LE strength and improved balance.    Baseline 8/23: 22 sec, 09/30/20=13.04 sec, 12/20: 27.05 sec, 01/09/21: 18.01s 3/3: 23 seconds no UE support 3/31; deferred due to patient throwing up 7/12: 15.2 seconds    Time 12    Period Weeks    Status Partially Met      PT LONG TERM GOAL #7   Title Patient will improve 6 min walk test >1000 feet with LRAD for improved gait ability in community.    Baseline 8/23: not tested; 8/30 845, goal adjusted >20025f 09/30/20=600 feet - stopped test due to R ankle catching making gait unsafe, 11/25/20= 330 feet - stopped test due to severe dizziness, vomiting/nausea, sweating making gait unsafe; 01/09/21: 720 ft with no rest breaks 3/3: deferred due to nausea 3/31: deferred due to patient throwing up 6/20: 640 ft 7/12: [id number] walking stick, no rest breaks;    Time 12    Period Weeks    Status Partially Met      PT LONG TERM GOAL #8   Title Patient will increase Berg Balance score by > 6 points  (52/56) to demonstrate decreased fall risk during functional activities.  Baseline 6/20: 46/56 7/12: 38/56 with migraine    Time 12    Period Weeks    Status On-going                   Plan - 07/10/21 2007     Clinical Impression Statement Patient reports decreased pain by end of session with decreased cervical tension and low back pain. He is highly motivated throughout session despite nausea from transfer. Ambulation is affected at end of session due to increased nausea. The patient continues to benefit from additional skilled PT services to improve LE strength and balance for decreased fall risk and improved quality of life.    Personal Factors and Comorbidities Comorbidity 3+;Time since onset of injury/illness/exacerbation    Comorbidities anxiety, back pain, COPD, headache, HTN, MI, sleep apnea, migraines    Examination-Activity Limitations Locomotion  Level;Transfers;Bed Mobility;Stand;Stairs;Sleep;Squat;Bend    Examination-Participation Restrictions Driving;Medication Management    Stability/Clinical Decision Making Unstable/Unpredictable    Rehab Potential Fair    PT Frequency 2x / week    PT Duration 12 weeks    PT Treatment/Interventions ADLs/Self Care Home Management;Canalith Repostioning;Moist Heat;Electrical Stimulation;Gait training;Stair training;Functional mobility training;Therapeutic activities;Therapeutic exercise;Balance training;Neuromuscular re-education;Patient/family education;Manual techniques;Passive range of motion;Vestibular    PT Next Visit Plan update HEP ea session for continued strengthening/balanceing at home    PT [place] Access Code: [email or web address] updated this session    Consulted and Agree with Plan of Care Patient;Other (Comment)   Friend            Patient will benefit from skilled therapeutic intervention in order to improve the following deficits and impairments:  Decreased activity tolerance, Decreased balance, Decreased cognition, Decreased mobility, Difficulty walking, Abnormal gait, Decreased range of motion, Dizziness, Pain, Impaired flexibility, Decreased strength, Impaired sensation, Decreased endurance, Decreased safety awareness  Visit Diagnosis: Muscle weakness (generalized)  Unsteadiness on feet  Dizziness and giddiness     Problem List Patient Active Problem List   Diagnosis Date Noted   Degenerative spondylolisthesis 03/25/2020   Physical deconditioning 03/04/2020   Lobar pneumonia, unspecified organism ([place]) 03/04/2020   Intractable hemiplegic migraine with status migrainosus 03/02/2020   Current tobacco use 02/05/2020   Abnormal findings on diagnostic imaging of lung 02/05/2020   Shortness of breath 02/05/2020   Herniated disc, cervical 01/23/2020   Cervical spondylosis with myelopathy and radiculopathy 01/23/2020   Pre-operative respiratory examination  01/12/2020   Weakness on right side of face 12/22/2019   Osteoarthritis of spine with radiculopathy, lumbar region 12/11/2019   Intractable migraine with aura with status migrainosus 11/09/2019   Former smoker 08/27/2018   Benign essential HTN 08/27/2018   Cough productive of purulent sputum 11/08/2017   GERD (gastroesophageal reflux disease) 05/06/2017   Contusion of hand 04/22/2017   BP (high blood pressure) 04/22/2017   Oxygen desaturation 04/22/2017   Prostate lump 04/22/2017   Unstable angina ([place]) 04/27/2016   Chest pain 04/24/2016   Morbid obesity ([place]) 01/29/2016   Elevated ALT measurement 10/30/2015   Nonspecific elevation of levels of transaminase and lactic acid dehydrogenase (ldh) 10/30/2015   Reduced libido 10/29/2015   Hyperlipidemia 08/06/2015   Anxiety 08/06/2015   Atherosclerosis of coronary artery 08/06/2015   Childhood asthma 08/06/2015   Chronic obstructive pulmonary disease ([place]) 08/06/2015   OSA (obstructive sleep apnea) 08/06/2015   Anxiety disorder 08/06/2015   Atherosclerosis of native coronary artery of native heart with stable angina pectoris ([place]) 10/09/1593   Uncomplicated asthma 58/59/2924   Janna Arch, PT, DPT  07/10/2021, 8:09 PM  [place] MAIN [facility] SERVICES [address] [place], [place], [address] Phone: [phone]   Fax:  [phone]  Name: Cory Espinoza MRN: [id number] Date of Birth: May 16, 1971

## 2021-07-14 ENCOUNTER — Ambulatory Visit: Payer: BC Managed Care – PPO

## 2021-07-14 ENCOUNTER — Other Ambulatory Visit: Payer: Self-pay

## 2021-07-14 DIAGNOSIS — M6281 Muscle weakness (generalized): Secondary | ICD-10-CM | POA: Diagnosis not present

## 2021-07-14 DIAGNOSIS — R2681 Unsteadiness on feet: Secondary | ICD-10-CM

## 2021-07-14 DIAGNOSIS — R42 Dizziness and giddiness: Secondary | ICD-10-CM

## 2021-07-14 NOTE — Therapy (Signed)
[place] MAIN [facility] SERVICES [address] [place], [place], [address] Phone: [phone]   Fax:  [phone]  Physical Therapy Treatment  Patient Details  Name: Cory Espinoza MRN: [id number] Date of Birth: July 01, 1971 Referring Provider (PT): Dr. Joselyn Arrow   Encounter Date: 07/14/2021   PT End of Session - 07/14/21 1657     Visit Number 69    Number of Visits 97    Date for PT Re-Evaluation 08/18/21    Authorization Type [employer] PPO; next session 4/10 PN 7/12    Authorization Time Period 01/09/21-04/03/21    PT Start Time 1515    PT Stop Time 1559    PT Time Calculation (min) 44 min    Equipment Utilized During Treatment Gait belt    Activity Tolerance Patient tolerated treatment well    Behavior During Therapy [facility] for tasks assessed/performed             Past Medical History:  Diagnosis Date   Allergy    Anginal pain ([place])    Anxiety    Arthritis    lumbar spine   Asthma    Atrial fibrillation (HCC)    Back pain    Severe Lumbar Pain   CHF (congestive heart failure) (HCC)    COPD (chronic obstructive pulmonary disease) ([place])    Restrictive lung disease   Coronary artery disease    Dyspnea    Dysrhythmia    afib   GERD (gastroesophageal reflux disease)    Headache    [name], onset October 2020, cluster headaches in the past   History of kidney stones    Hyperlipidemia    Hypertension    Myocardial infarction ([place])    at age 26   Pneumonia    Restrictive lung disease    Sleep apnea    unable to use cpap since onset of migraines    Past Surgical History:  Procedure Laterality Date   ABDOMINAL EXPOSURE N/A 03/25/2020   Procedure: ABDOMINAL EXPOSURE;  Surgeon: Rosetta Posner, MD;  Location: [facility] OR;  Service: Vascular;  Laterality: N/A;   ANTERIOR CERVICAL DECOMP/DISCECTOMY FUSION N/A 01/23/2020   Procedure: ANTERIOR CERVICAL DECOMPRESSION FUSION - CERVICAL SIX-CERVICAL SEVEN;  Surgeon: Earnie Larsson, MD;  Location:  [place];  Service: Neurosurgery;  Laterality: N/A;   ANTERIOR LUMBAR FUSION N/A 03/25/2020   Procedure: ANTERIOR LUMBAR INTERBODY FUSION LUMBAR FIVE-SACRAL ONE.;  Surgeon: Earnie Larsson, MD;  Location: [place];  Service: Neurosurgery;  Laterality: N/A;  anterior   BACK SURGERY  1996   CARDIAC CATHETERIZATION Left 04/27/2016   Procedure: Left Heart Cath and Coronary Angiography;  Surgeon: Corey Skains, MD;  Location: [place] CV LAB;  Service: Cardiovascular;  Laterality: Left;   CARDIAC CATHETERIZATION N/A 04/27/2016   Procedure: Intravascular Pressure Wire/FFR Study;  Surgeon: Yolonda Kida, MD;  Location: [place] [facility] LAB;  Service: Cardiovascular;  Laterality: N/A;   CATHETERIZATION OF PULMONARY ARTERY WITH RETRIEVAL OF FOREIGN BODY Bilateral 04/07/2011   heart   DEGENERATIVE SPONDYLOLISTHESIS     KNEE ARTHROSCOPY Bilateral 2004   LUMBAR DISC SURGERY  1999   RADICULOPATHY, CERVICAL REGION     TONSILLECTOMY      There were no vitals filed for this visit.   Subjective Assessment - 07/14/21 1656     Subjective Patient had a stumble yesterday when stepping up into the kitchen. Was able to get into the pool and do exercises.    Patient is accompained by:  Family member    Pertinent History Pt has a complex medical history. 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 He states that he fell 10-12 feet and struck his head, neck, and back. He was able to finish the course which took him approximately 45 minutes more to complete. He does endorse a second fall while finishing the course but did not suffer a second head injury. He states that he started getting headaches that day which have persisted since that time. 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 He does report improvement in his headaches recently. Neurology was concerned about possible BPPV and have referred him for a  vestibular evaluation. In addition since the injury, pt developed RUE and RLE pain and weakness. Pt states that NCV showed abnormal nerve conduction in RUE. He has since undergone a C7-7 ACDF on 01/23/20. He reports initial improvement in RUE strength, but states that he has a gradual return of his RUE weakness. He was also having significant RLE weakness and pain and underwent and L5-S1 anterior lumbar interbody fusion on 03/25/20. Patient reports that he does not have any back precautions but needs to wear the low back brace for one more month. Patient reports he has a follow-up appointment with the surgeon next month. Patient reports he has a follow-up appointment with Dr. Brigitte Pulse, neurologist, in August. He arrives to therapy ambulating with an upright rollator walker. Pt denies any mention of concussion after his injury. He has been having cognitive issues since his injury and has previously had a heavy metals screen as well as neurocognitive testing. He has had an MRI of his brain with and without contrast on 12/18/19. Results were mildly motion degraded examination. No evidence of acute intracranial abnormality. Minimal chronic small vessel ischemic disease. He has tremor in both his hands. He has a positive family history of Parkinson's disease in his grandfather. He is complaining of constant dizziness and unsteadiness which are worse with activity. Patient states that he frequently loses his balance and his wife has to steady him.    Limitations Lifting;Standing;Walking;House hold activities    Diagnostic tests He has had an MRI of his brain with and without contrast on 12/18/19. Results were mildly motion degraded examination. No evidence of acute intracranial abnormality. Minimal chronic small vessel ischemic disease.    Patient Stated Goals to be able to ambulate with a cane or no AD, to be able to drive, to be able to ride his motorcycle    Currently in Pain? Yes    Pain Score 7     Pain Location Head     Pain Descriptors / Indicators Aching;Headache    Pain Type Chronic pain    Pain Onset More than a month ago            Patient had a stumble yesterday when stepping up into the kitchen. Was able to get into the pool and do exercises.     Treatment:    in room with lights off and noise damped due to migraine.    TherEx Seated:  Resisted knee extension 10x 5 second isometric hold RLE; very challenging with excessive LE trembling by end of intervention  4lb weight:cues for body mechanics sequencing with tactile cue for full arc of motion -LAQ 15x each LE: very fatiguing; 3 second holds  -March 15x each LE -[id number]   RTB: -  adduction against PT resistance 15x each LE -hamstring curl against PT resistance 10x each LE   Standing with chair in front of patient; CGA -marching with BUE support 15x each LE -tandem stance 2x 30 seconds with RLE back, very challenging to stabilize on RLE  Sit to stand training, focus on pausing before moving to ensure he is not unsteady.    Manual:  STM to bilateral upper traps, levator scap, and cervical paraspinals x 13 minutes, implementation of effleurage and ptrissage included.  patient reports decreased headache pain by end of session.        Pt educated throughout session about proper posture and technique with exercises. Improved exercise technique, movement at target joints, use of target muscles after min to mod verbal, visual, tactile cues   Patient tolerated intermixed seated and standing interventions this session. Would like to continue to focus on strengthening and add back balance interventions as patient tolerates. Patient will be absent next week due to seizure study. The patient continues to benefit from additional skilled PT services to improve LE strength and balance for decreased fall risk and improved quality of life                     PT Education - 07/14/21 1657     Education Details exercise  technique, body mechanics    Person(s) Educated Patient    Methods Explanation;Demonstration;Tactile cues;Verbal cues    Comprehension Verbalized understanding;Returned demonstration;Verbal cues required;Tactile cues required              PT Short Term Goals - 06/30/21 1746       PT SHORT TERM GOAL #1   Title Patient will be independent in home exercise program to improve strength/mobility for better functional independence with ADLs and for self-management.    Baseline 3/3 HEP compliant 3/31 hep compliant    Time 6    Period Weeks    Status Achieved    Target Date 02/20/21      PT SHORT TERM GOAL #2   Title Patient will be modified independent in walking on even/uneven surface with least restrictive assistive device, for 10+ minutes without rest break, reporting some difficulty or less to improve walking tolerance with community ambulation including grocery shopping, going to church,etc.    Baseline 02/03: instability, LOB multiple times while ambulating to elevator 3/31: unable to test due to throwing up 6/20: 6 minutes 7/12: unable to test due to migraine    Time 6    Period Weeks    Status Partially Met    Target Date 07/07/21      PT SHORT TERM GOAL #3   Title Patient will increase ABC scale score >80% to demonstrate better functional mobility and better confidence with ADLs.    Baseline 01/09/21: 60.625% 3/3/: 50% 3/31: 63% 6/20: 45% 7/25: 40%    Time 6    Period Weeks    Status On-going    Target Date 07/07/21               PT Long Term Goals - 06/30/21 1747       PT LONG TERM GOAL #1   Title Patient will increase FOTO score to equal to or greater than 65 to demonstrate statistically significant improvement in mobility and quality of life.    Baseline scored 41/100 on 05/21/2020; 7/22 40, 8/23: 60/100, 09/30/20=40, 01/09/21: 46 3/3: 40.3% 3/31: 49% 6/20: 45% 7/12: 50.7%    Time 12    Period  Weeks    Status Partially Met      PT LONG TERM GOAL #2   Title  Patient will reduce falls risk as indicated by decreased TUG time to less than 11 seconds.    Baseline scored 37.9 sec with TUG on 05/21/2020; 21seconds with elevated rollator 7/22, 8/23: 13.62 sec with up and go walker, 01/09/21: 15.81s with hiking stick 3/3: 17.98 seconds one near LOB 3/31: 15.57 seconds with walking stick 6/20: 14.8 seconds with walking stick. 7/25: 19.72 seconds with walking stick.    Time 12    Period Weeks    Status Partially Met      PT LONG TERM GOAL #3   Title Patient will increase right UE and LE gross strength to 4+/5 throughout to improve functional strength for independent gait, increased standing tolerance and increased ADL ability.    Baseline grossly +3/5 to -4/5 right UE and LE strength on 05/21/2020; grossly 4-/5 for RUE, grossly 4-/5 for RLE on 7/22, grossly 4-/5 RUE, grossly 4/5 RLE 3/3: see note    Time 8    Period Weeks    Status Partially Met      PT LONG TERM GOAL #4   Title Patient will increase 10 meter walk test to >1.87ms as to improve gait speed for better community ambulation and to reduce fall risk.    Baseline on 7/22 .57 m/sself selected, fast  .73 m/s with LOB pt reported R knee buckling, elevated rollator used, 8/23: 1.06 m/s with up and go walker, 01/09/21: 0.72 m/s with hiking stick 3/3: 0.74 m/s with walking stick 3/31: 0.81 m/s w walking stick 6/20: 1.1 m/s with walking stick    Time 12    Period Weeks    Status Achieved      PT LONG TERM GOAL #5   Title Patient will increase Berg Balance score by > 6 points to demonstrate decreased fall risk during functional activities.    Baseline 8/23: 37/56, 09/30/20=40/56, 12/20: 42/56 3/3: deferred 3/31 will perform next time; deferred due to throwing up 6/20: 46/56 7/12 38/56    Time 8    Period Weeks    Status Achieved      PT LONG TERM GOAL #6   Title Patient (< 645years old) will complete five times sit to stand test in < 10 seconds indicating an increased LE strength and improved balance.     Baseline 8/23: 22 sec, 09/30/20=13.04 sec, 12/20: 27.05 sec, 01/09/21: 18.01s 3/3: 23 seconds no UE support 3/31; deferred due to patient throwing up 7/12: 15.2 seconds    Time 12    Period Weeks    Status Partially Met      PT LONG TERM GOAL #7   Title Patient will improve 6 min walk test >1000 feet with LRAD for improved gait ability in community.    Baseline 8/23: not tested; 8/30 845, goal adjusted >20026f 09/30/20=600 feet - stopped test due to R ankle catching making gait unsafe, 11/25/20= 330 feet - stopped test due to severe dizziness, vomiting/nausea, sweating making gait unsafe; 01/09/21: 720 ft with no rest breaks 3/3: deferred due to nausea 3/31: deferred due to patient throwing up 6/20: 640 ft 7/12: [id number] walking stick, no rest breaks;    Time 12    Period Weeks    Status Partially Met      PT LONG TERM GOAL #8   Title Patient will increase Berg Balance score by > 6 points  (52/56) to demonstrate  decreased fall risk during functional activities.    Baseline 6/20: 46/56 7/12: 38/56 with migraine    Time 12    Period Weeks    Status On-going                   Plan - 07/14/21 1659     Clinical Impression Statement Patient tolerated intermixed seated and standing interventions this session. Would like to continue to focus on strengthening and add back balance interventions as patient tolerates. Patient will be absent next week due to seizure study. The patient continues to benefit from additional skilled PT services to improve LE strength and balance for decreased fall risk and improved quality of life    Personal Factors and Comorbidities Comorbidity 3+;Time since onset of injury/illness/exacerbation    Comorbidities anxiety, back pain, COPD, headache, HTN, MI, sleep apnea, migraines    Examination-Activity Limitations Locomotion Level;Transfers;Bed Mobility;Stand;Stairs;Sleep;Squat;Bend    Examination-Participation Restrictions Driving;Medication Management     Stability/Clinical Decision Making Unstable/Unpredictable    Rehab Potential Fair    PT Frequency 2x / week    PT Duration 12 weeks    PT Treatment/Interventions ADLs/Self Care Home Management;Canalith Repostioning;Moist Heat;Electrical Stimulation;Gait training;Stair training;Functional mobility training;Therapeutic activities;Therapeutic exercise;Balance training;Neuromuscular re-education;Patient/family education;Manual techniques;Passive range of motion;Vestibular    PT Next Visit Plan update HEP ea session for continued strengthening/balanceing at home    PT [place] Access Code: [email or web address] updated this session    Consulted and Agree with Plan of Care Patient;Other (Comment)   Friend            Patient will benefit from skilled therapeutic intervention in order to improve the following deficits and impairments:  Decreased activity tolerance, Decreased balance, Decreased cognition, Decreased mobility, Difficulty walking, Abnormal gait, Decreased range of motion, Dizziness, Pain, Impaired flexibility, Decreased strength, Impaired sensation, Decreased endurance, Decreased safety awareness  Visit Diagnosis: Muscle weakness (generalized)  Unsteadiness on feet  Dizziness and giddiness     Problem List Patient Active Problem List   Diagnosis Date Noted   Degenerative spondylolisthesis 03/25/2020   Physical deconditioning 03/04/2020   Lobar pneumonia, unspecified organism ([place]) 03/04/2020   Intractable hemiplegic migraine with status migrainosus 03/02/2020   Current tobacco use 02/05/2020   Abnormal findings on diagnostic imaging of lung 02/05/2020   Shortness of breath 02/05/2020   Herniated disc, cervical 01/23/2020   Cervical spondylosis with myelopathy and radiculopathy 01/23/2020   Pre-operative respiratory examination 01/12/2020   Weakness on right side of face 12/22/2019   Osteoarthritis of spine with radiculopathy, lumbar region 12/11/2019    Intractable migraine with aura with status migrainosus 11/09/2019   Former smoker 08/27/2018   Benign essential HTN 08/27/2018   Cough productive of purulent sputum 11/08/2017   GERD (gastroesophageal reflux disease) 05/06/2017   Contusion of hand 04/22/2017   BP (high blood pressure) 04/22/2017   Oxygen desaturation 04/22/2017   Prostate lump 04/22/2017   Unstable angina ([place]) 04/27/2016   Chest pain 04/24/2016   Morbid obesity ([place]) 01/29/2016   Elevated ALT measurement 10/30/2015   Nonspecific elevation of levels of transaminase and lactic acid dehydrogenase (ldh) 10/30/2015   Reduced libido 10/29/2015   Hyperlipidemia 08/06/2015   Anxiety 08/06/2015   Atherosclerosis of coronary artery 08/06/2015   Childhood asthma 08/06/2015   Chronic obstructive pulmonary disease ([place]) 08/06/2015   OSA (obstructive sleep apnea) 08/06/2015   Anxiety disorder 08/06/2015   Atherosclerosis of native coronary artery of native heart with stable angina pectoris ([place]) 19/41/7408   Uncomplicated asthma 14/48/1856  Janna Arch, PT, DPT  07/14/2021, 5:01 PM  [place] MAIN [facility] SERVICES [address] [place], [place], [address] Phone: [phone]   Fax:  [phone]  Name: Cory Espinoza MRN: [id number] Date of Birth: 24-Nov-1971

## 2021-07-17 ENCOUNTER — Ambulatory Visit: Payer: BC Managed Care – PPO

## 2021-07-17 ENCOUNTER — Other Ambulatory Visit: Payer: Self-pay

## 2021-07-21 ENCOUNTER — Ambulatory Visit: Payer: BC Managed Care – PPO

## 2021-07-24 ENCOUNTER — Ambulatory Visit: Payer: BC Managed Care – PPO

## 2021-07-28 ENCOUNTER — Ambulatory Visit: Payer: BC Managed Care – PPO

## 2021-07-31 ENCOUNTER — Other Ambulatory Visit: Payer: Self-pay

## 2021-07-31 ENCOUNTER — Ambulatory Visit: Payer: BC Managed Care – PPO

## 2021-07-31 DIAGNOSIS — R42 Dizziness and giddiness: Secondary | ICD-10-CM

## 2021-07-31 DIAGNOSIS — M6281 Muscle weakness (generalized): Secondary | ICD-10-CM

## 2021-07-31 DIAGNOSIS — R2681 Unsteadiness on feet: Secondary | ICD-10-CM

## 2021-07-31 NOTE — Therapy (Signed)
[place] MAIN [facility] SERVICES [address] [place], [place], [address] Phone: [phone]   Fax:  [phone]  Physical Therapy Treatment  Patient Details  Name: Cory Espinoza MRN: [id number] Date of Birth: October 20, 1971 Referring Provider (PT): Dr. Joselyn Arrow   Encounter Date: 07/31/2021   PT End of Session - 07/31/21 2046     Visit Number 30    Number of Visits 30    Date for PT Re-Evaluation 08/18/21    Authorization Type [employer] PPO; next session 5/10 PN 7/12    Authorization Time Period 01/09/21-04/03/21    PT Start Time 1515    PT Stop Time 1559    PT Time Calculation (min) 44 min    Equipment Utilized During Treatment Gait belt    Activity Tolerance Patient tolerated treatment well    Behavior During Therapy [facility] for tasks assessed/performed             Past Medical History:  Diagnosis Date   Allergy    Anginal pain (HCC)    Anxiety    Arthritis    lumbar spine   Asthma    Atrial fibrillation (HCC)    Back pain    Severe Lumbar Pain   CHF (congestive heart failure) (HCC)    COPD (chronic obstructive pulmonary disease) ([place])    Restrictive lung disease   Coronary artery disease    Dyspnea    Dysrhythmia    afib   GERD (gastroesophageal reflux disease)    Headache    [name], onset October 2020, cluster headaches in the past   History of kidney stones    Hyperlipidemia    Hypertension    Myocardial infarction ([place])    at age 11   Pneumonia    Restrictive lung disease    Sleep apnea    unable to use cpap since onset of migraines    Past Surgical History:  Procedure Laterality Date   ABDOMINAL EXPOSURE N/A 03/25/2020   Procedure: ABDOMINAL EXPOSURE;  Surgeon: Rosetta Posner, MD;  Location: [facility] OR;  Service: Vascular;  Laterality: N/A;   ANTERIOR CERVICAL DECOMP/DISCECTOMY FUSION N/A 01/23/2020   Procedure: ANTERIOR CERVICAL DECOMPRESSION FUSION - CERVICAL SIX-CERVICAL SEVEN;  Surgeon: Earnie Larsson, MD;  Location:  [place];  Service: Neurosurgery;  Laterality: N/A;   ANTERIOR LUMBAR FUSION N/A 03/25/2020   Procedure: ANTERIOR LUMBAR INTERBODY FUSION LUMBAR FIVE-SACRAL ONE.;  Surgeon: Earnie Larsson, MD;  Location: [place];  Service: Neurosurgery;  Laterality: N/A;  anterior   BACK SURGERY  1996   CARDIAC CATHETERIZATION Left 04/27/2016   Procedure: Left Heart Cath and Coronary Angiography;  Surgeon: Corey Skains, MD;  Location: [place] CV LAB;  Service: Cardiovascular;  Laterality: Left;   CARDIAC CATHETERIZATION N/A 04/27/2016   Procedure: Intravascular Pressure Wire/FFR Study;  Surgeon: Yolonda Kida, MD;  Location: [place] CV LAB;  Service: Cardiovascular;  Laterality: N/A;   CATHETERIZATION OF PULMONARY ARTERY WITH RETRIEVAL OF FOREIGN BODY Bilateral 04/07/2011   heart   DEGENERATIVE SPONDYLOLISTHESIS     KNEE ARTHROSCOPY Bilateral 2004   LUMBAR DISC SURGERY  1999   RADICULOPATHY, CERVICAL REGION     TONSILLECTOMY      There were no vitals filed for this visit.   Subjective Assessment - 07/31/21 2044     Subjective Patient returning to therapy after a week absence due to seizure study at [facility].  Patient reports that he was unsuccessful and was unable to have a seizure  observed.  Reports 1 fall yesterday but did not hit head.  Reports today is a good day and headache is minimal.    Patient is accompained by: Family member    Pertinent History Pt has a complex medical history. He reports that he fell during a [employer] training in [place] in the fall of 2020. He states that he fell 10-12 feet and struck his head, neck, and back. He was able to finish the course which took him approximately 45 minutes more to complete. He does endorse a second fall while finishing the course but did not suffer a second head injury. He states that he started getting headaches that day which have persisted since that time. He is currently under the care of neurology who is treating him for hemiplegic migraines and R  occipital neuralgia. He does report improvement in his headaches recently. Neurology was concerned about possible BPPV and have referred him for a vestibular evaluation. In addition since the injury, pt developed RUE and RLE pain and weakness. Pt states that NCV showed abnormal nerve conduction in RUE. He has since undergone a C7-7 ACDF on 01/23/20. He reports initial improvement in RUE strength, but states that he has a gradual return of his RUE weakness. He was also having significant RLE weakness and pain and underwent and L5-S1 anterior lumbar interbody fusion on 03/25/20. Patient reports that he does not have any back precautions but needs to wear the low back brace for one more month. Patient reports he has a follow-up appointment with the surgeon next month. Patient reports he has a follow-up appointment with Dr. Brigitte Pulse, neurologist, in August. He arrives to therapy ambulating with an upright rollator walker. Pt denies any mention of concussion after his injury. He has been having cognitive issues since his injury and has previously had a heavy metals screen as well as neurocognitive testing. He has had an MRI of his brain with and without contrast on 12/18/19. Results were mildly motion degraded examination. No evidence of acute intracranial abnormality. Minimal chronic small vessel ischemic disease. He has tremor in both his hands. He has a positive family history of Parkinson's disease in his grandfather. He is complaining of constant dizziness and unsteadiness which are worse with activity. Patient states that he frequently loses his balance and his wife has to steady him.    Limitations Lifting;Standing;Walking;House hold activities    Diagnostic tests He has had an MRI of his brain with and without contrast on 12/18/19. Results were mildly motion degraded examination. No evidence of acute intracranial abnormality. Minimal chronic small vessel ischemic disease.    Patient Stated Goals to be able to ambulate  with a cane or no AD, to be able to drive, to be able to ride his motorcycle    Currently in Pain? Yes    Pain Score 2     Pain Location Head    Pain Descriptors / Indicators Aching    Pain Type Chronic pain;Neuropathic pain    Pain Onset More than a month ago    Pain Frequency Constant               in // bars:  Hip flexion into GTB 15x each LE Three hedgehog taps with BUE support 3x each LE Tandem stance 30 seconds each LE placement  Standing with # 4 ankle weight: CGA for stability  -Hip extension with BUE upper extremity support, cueing for neutral hip alignment, upright posture for optimal muscle recruitment, and sequencing, 10x  each LE,  -Hip abduction with BUE upper extremity support, cueing for neutral foot alignment for correct muscle activation, 10x each LE -Hip flexion with BUE upper extremity support, cueing for body mechanics, speed of muscle recruitment for optimal strengthening and stabilization 10x each LE    Seated: RTB hamstring curl 15x each LE  RTB adduction 15x   Seated with # 4 ankle weights  -Seated marches with upright posture, back away from back of chair for abdominal/trunk activation/stabilization, 10x each LE -Seated LAQ with 3 second holds, 10x each LE, cueing for muscle activation and sequencing for neutral alignment -Seated IR/ER with cueing for stabilizing knee placement with lateral foot movement for optimal muscle recruitment, 10x each LE   Pt educated throughout session about proper posture and technique with exercises. Improved exercise technique, movement at target joints, use of target muscles after min to mod verbal, visual, tactile cues.  Patient presents having a good day. He is able to perform standing interventions in gym this session. One minor seizure in sitting noted lasting approximately 5 seconds however patient was able to return to normal state without further issue.  Patient is highly motivated throughout session despite  right knee hyperextension and pain with weightbearing interventions.The patient continues to benefit from additional skilled PT services to improve LE strength and balance for decreased fall risk and improved quality of life                     PT Education - 07/31/21 2046     Education Details Exercise technique body mechanics    Person(s) Educated Patient    Methods Explanation;Demonstration;Tactile cues;Verbal cues    Comprehension Verbalized understanding;Returned demonstration;Verbal cues required              PT Short Term Goals - 06/30/21 1746       PT SHORT TERM GOAL #1   Title Patient will be independent in home exercise program to improve strength/mobility for better functional independence with ADLs and for self-management.    Baseline 3/3 HEP compliant 3/31 hep compliant    Time 6    Period Weeks    Status Achieved    Target Date 02/20/21      PT SHORT TERM GOAL #2   Title Patient will be modified independent in walking on even/uneven surface with least restrictive assistive device, for 10+ minutes without rest break, reporting some difficulty or less to improve walking tolerance with community ambulation including grocery shopping, going to church,etc.    Baseline 02/03: instability, LOB multiple times while ambulating to elevator 3/31: unable to test due to throwing up 6/20: 6 minutes 7/12: unable to test due to migraine    Time 6    Period Weeks    Status Partially Met    Target Date 07/07/21      PT SHORT TERM GOAL #3   Title Patient will increase ABC scale score >80% to demonstrate better functional mobility and better confidence with ADLs.    Baseline 01/09/21: 60.625% 3/3/: 50% 3/31: 63% 6/20: 45% 7/25: 40%    Time 6    Period Weeks    Status On-going    Target Date 07/07/21               PT Long Term Goals - 06/30/21 1747       PT LONG TERM GOAL #1   Title Patient will increase FOTO score to equal to or greater than 65 to  demonstrate statistically significant improvement  in mobility and quality of life.    Baseline scored 41/100 on 05/21/2020; 7/22 40, 8/23: 60/100, 09/30/20=40, 01/09/21: 46 3/3: 40.3% 3/31: 49% 6/20: 45% 7/12: 50.7%    Time 12    Period Weeks    Status Partially Met      PT LONG TERM GOAL #2   Title Patient will reduce falls risk as indicated by decreased TUG time to less than 11 seconds.    Baseline scored 37.9 sec with TUG on 05/21/2020; 21seconds with elevated rollator 7/22, 8/23: 13.62 sec with up and go walker, 01/09/21: 15.81s with hiking stick 3/3: 17.98 seconds one near LOB 3/31: 15.57 seconds with walking stick 6/20: 14.8 seconds with walking stick. 7/25: 19.72 seconds with walking stick.    Time 12    Period Weeks    Status Partially Met      PT LONG TERM GOAL #3   Title Patient will increase right UE and LE gross strength to 4+/5 throughout to improve functional strength for independent gait, increased standing tolerance and increased ADL ability.    Baseline grossly +3/5 to -4/5 right UE and LE strength on 05/21/2020; grossly 4-/5 for RUE, grossly 4-/5 for RLE on 7/22, grossly 4-/5 RUE, grossly 4/5 RLE 3/3: see note    Time 8    Period Weeks    Status Partially Met      PT LONG TERM GOAL #4   Title Patient will increase 10 meter walk test to >1.58ms as to improve gait speed for better community ambulation and to reduce fall risk.    Baseline on 7/22 .57 m/sself selected, fast  .73 m/s with LOB pt reported R knee buckling, elevated rollator used, 8/23: 1.06 m/s with up and go walker, 01/09/21: 0.72 m/s with hiking stick 3/3: 0.74 m/s with walking stick 3/31: 0.81 m/s w walking stick 6/20: 1.1 m/s with walking stick    Time 12    Period Weeks    Status Achieved      PT LONG TERM GOAL #5   Title Patient will increase Berg Balance score by > 6 points to demonstrate decreased fall risk during functional activities.    Baseline 8/23: 37/56, 09/30/20=40/56, 12/20: 42/56 3/3: deferred  3/31 will perform next time; deferred due to throwing up 6/20: 46/56 7/12 38/56    Time 8    Period Weeks    Status Achieved      PT LONG TERM GOAL #6   Title Patient (< 614years old) will complete five times sit to stand test in < 10 seconds indicating an increased LE strength and improved balance.    Baseline 8/23: 22 sec, 09/30/20=13.04 sec, 12/20: 27.05 sec, 01/09/21: 18.01s 3/3: 23 seconds no UE support 3/31; deferred due to patient throwing up 7/12: 15.2 seconds    Time 12    Period Weeks    Status Partially Met      PT LONG TERM GOAL #7   Title Patient will improve 6 min walk test >1000 feet with LRAD for improved gait ability in community.    Baseline 8/23: not tested; 8/30 845, goal adjusted >20041f 09/30/20=600 feet - stopped test due to R ankle catching making gait unsafe, 11/25/20= 330 feet - stopped test due to severe dizziness, vomiting/nausea, sweating making gait unsafe; 01/09/21: 720 ft with no rest breaks 3/3: deferred due to nausea 3/31: deferred due to patient throwing up 6/20: 640 ft 7/12: [id number] walking stick, no rest breaks;    Time 12  Period Weeks    Status Partially Met      PT LONG TERM GOAL #8   Title Patient will increase Berg Balance score by > 6 points  (52/56) to demonstrate decreased fall risk during functional activities.    Baseline 6/20: 46/56 7/12: 38/56 with migraine    Time 12    Period Weeks    Status On-going                   Plan - 07/31/21 2047     Clinical Impression Statement Patient presents having a good day. He is able to perform standing interventions in gym this session. One minor seizure in sitting noted lasting approximately 5 seconds however patient was able to return to normal state without further issue.  Patient is highly motivated throughout session despite right knee hyperextension and pain with weightbearing interventions.The patient continues to benefit from additional skilled PT services to improve LE strength  and balance for decreased fall risk and improved quality of life    Personal Factors and Comorbidities Comorbidity 3+;Time since onset of injury/illness/exacerbation    Comorbidities anxiety, back pain, COPD, headache, HTN, MI, sleep apnea, migraines    Examination-Activity Limitations Locomotion Level;Transfers;Bed Mobility;Stand;Stairs;Sleep;Squat;Bend    Examination-Participation Restrictions Driving;Medication Management    Stability/Clinical Decision Making Unstable/Unpredictable    Rehab Potential Fair    PT Frequency 2x / week    PT Duration 12 weeks    PT Treatment/Interventions ADLs/Self Care Home Management;Canalith Repostioning;Moist Heat;Electrical Stimulation;Gait training;Stair training;Functional mobility training;Therapeutic activities;Therapeutic exercise;Balance training;Neuromuscular re-education;Patient/family education;Manual techniques;Passive range of motion;Vestibular    PT Next Visit Plan dry needle upper trap, e stim to RLE for foot drop    PT Home Exercise Plan Medbridge Access Code: [email or web address] updated this session    Consulted and Agree with Plan of Care Patient;Other (Comment)   Friend            Patient will benefit from skilled therapeutic intervention in order to improve the following deficits and impairments:  Decreased activity tolerance, Decreased balance, Decreased cognition, Decreased mobility, Difficulty walking, Abnormal gait, Decreased range of motion, Dizziness, Pain, Impaired flexibility, Decreased strength, Impaired sensation, Decreased endurance, Decreased safety awareness  Visit Diagnosis: Muscle weakness (generalized)  Unsteadiness on feet  Dizziness and giddiness     Problem List Patient Active Problem List   Diagnosis Date Noted   Degenerative spondylolisthesis 03/25/2020   Physical deconditioning 03/04/2020   Lobar pneumonia, unspecified organism ([place]) 03/04/2020   Intractable hemiplegic migraine with status migrainosus 03/02/2020    Current tobacco use 02/05/2020   Abnormal findings on diagnostic imaging of lung 02/05/2020   Shortness of breath 02/05/2020   Herniated disc, cervical 01/23/2020   Cervical spondylosis with myelopathy and radiculopathy 01/23/2020   Pre-operative respiratory examination 01/12/2020   Weakness on right side of face 12/22/2019   Osteoarthritis of spine with radiculopathy, lumbar region 12/11/2019   Intractable migraine with aura with status migrainosus 11/09/2019   Former smoker 08/27/2018   Benign essential HTN 08/27/2018   Cough productive of purulent sputum 11/08/2017   GERD (gastroesophageal reflux disease) 05/06/2017   Contusion of hand 04/22/2017   BP (high blood pressure) 04/22/2017   Oxygen desaturation 04/22/2017   Prostate lump 04/22/2017   Unstable angina ([place]) 04/27/2016   Chest pain 04/24/2016   Morbid obesity ([place]) 01/29/2016   Elevated ALT measurement 10/30/2015   Nonspecific elevation of levels of transaminase and lactic acid dehydrogenase (ldh) 10/30/2015   Reduced libido 10/29/2015   Hyperlipidemia 08/06/2015  Anxiety 08/06/2015   Atherosclerosis of coronary artery 08/06/2015   Childhood asthma 08/06/2015   Chronic obstructive pulmonary disease ([place]) 08/06/2015   OSA (obstructive sleep apnea) 08/06/2015   Anxiety disorder 08/06/2015   Atherosclerosis of native coronary artery of native heart with stable angina pectoris ([place]) 68/15/9470   Uncomplicated asthma 76/15/1834    Janna Arch, PT, DPT  07/31/2021, 8:48 PM  [place] MAIN [facility] SERVICES [address] [place], [place], [address] Phone: [phone]   Fax:  [phone]  Name: Cory Espinoza MRN: [id number] Date of Birth: 11-13-1971

## 2021-08-04 ENCOUNTER — Ambulatory Visit: Payer: BC Managed Care – PPO

## 2021-08-07 ENCOUNTER — Ambulatory Visit: Payer: BC Managed Care – PPO | Attending: Neurology

## 2021-08-07 ENCOUNTER — Other Ambulatory Visit: Payer: Self-pay

## 2021-08-07 DIAGNOSIS — R2681 Unsteadiness on feet: Secondary | ICD-10-CM | POA: Insufficient documentation

## 2021-08-07 DIAGNOSIS — M6281 Muscle weakness (generalized): Secondary | ICD-10-CM | POA: Diagnosis not present

## 2021-08-07 DIAGNOSIS — R42 Dizziness and giddiness: Secondary | ICD-10-CM | POA: Insufficient documentation

## 2021-08-07 NOTE — Therapy (Signed)
[place] MAIN [facility] SERVICES [address] [place], [place], [address] Phone: [phone]   Fax:  [phone]  Physical Therapy Treatment  Patient Details  Name: Cory Espinoza MRN: [id number] Date of Birth: [birth year] Referring Provider (PT): Dr. Joselyn Arrow   Encounter Date: 08/07/2021   PT End of Session - 08/07/21 1456     Visit Number 55    Number of Visits 2    Date for PT Re-Evaluation 08/18/21    Authorization Type [employer] PPO; next session 6/10 PN 7/12    Authorization Time Period 01/09/21-04/03/21    PT Start Time 1501    PT Stop Time 1547    PT Time Calculation (min) 46 min    Equipment Utilized During Treatment Gait belt    Activity Tolerance Patient tolerated treatment well    Behavior During Therapy [facility] for tasks assessed/performed             Past Medical History:  Diagnosis Date   Allergy    Anginal pain ([place])    Anxiety    Arthritis    lumbar spine   Asthma    Atrial fibrillation (HCC)    Back pain    Severe Lumbar Pain   CHF (congestive heart failure) (HCC)    COPD (chronic obstructive pulmonary disease) ([place])    Restrictive lung disease   Coronary artery disease    Dyspnea    Dysrhythmia    afib   GERD (gastroesophageal reflux disease)    Headache    [name], onset October 2020, cluster headaches in the past   History of kidney stones    Hyperlipidemia    Hypertension    Myocardial infarction ([place])    at age 59   Pneumonia    Restrictive lung disease    Sleep apnea    unable to use cpap since onset of migraines    Past Surgical History:  Procedure Laterality Date   ABDOMINAL EXPOSURE N/A 03/25/2020   Procedure: ABDOMINAL EXPOSURE;  Surgeon: Rosetta Posner, MD;  Location: [facility] OR;  Service: Vascular;  Laterality: N/A;   ANTERIOR CERVICAL DECOMP/DISCECTOMY FUSION N/A 01/23/2020   Procedure: ANTERIOR CERVICAL DECOMPRESSION FUSION - CERVICAL SIX-CERVICAL SEVEN;  Surgeon: Earnie Larsson, MD;  Location:  [place];  Service: Neurosurgery;  Laterality: N/A;   ANTERIOR LUMBAR FUSION N/A 03/25/2020   Procedure: ANTERIOR LUMBAR INTERBODY FUSION LUMBAR FIVE-SACRAL ONE.;  Surgeon: Earnie Larsson, MD;  Location: [place];  Service: Neurosurgery;  Laterality: N/A;  anterior   BACK SURGERY  1996   CARDIAC CATHETERIZATION Left 04/27/2016   Procedure: Left Heart Cath and Coronary Angiography;  Surgeon: Corey Skains, MD;  Location: [place] CV LAB;  Service: Cardiovascular;  Laterality: Left;   CARDIAC CATHETERIZATION N/A 04/27/2016   Procedure: Intravascular Pressure Wire/FFR Study;  Surgeon: Yolonda Kida, MD;  Location: [place] [facility];  Service: Cardiovascular;  Laterality: N/A;   CATHETERIZATION OF PULMONARY ARTERY WITH RETRIEVAL OF FOREIGN BODY Bilateral 04/07/2011   heart   DEGENERATIVE SPONDYLOLISTHESIS     KNEE ARTHROSCOPY Bilateral 2004   LUMBAR DISC SURGERY  1999   RADICULOPATHY, CERVICAL REGION     TONSILLECTOMY      There were no vitals filed for this visit.   Subjective Assessment - 08/07/21 1649     Subjective Patient had a fall outside earlier this week. Was helping move furniture and hurt his back over the weekend.    Patient is accompained by: Family member  Pertinent History Pt has a complex medical history. 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 He states that he fell 10-12 feet and struck his head, neck, and back. He was able to finish the course which took him approximately 45 minutes more to complete. He does endorse a second fall while finishing the course but did not suffer a second head injury. He states that he started getting headaches that day which have persisted since that time. 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 He does report improvement in his headaches recently. Neurology was concerned about possible BPPV and have referred him for a vestibular  evaluation. In addition since the injury, pt developed RUE and RLE pain and weakness. Pt states that NCV showed abnormal nerve conduction in RUE. He has since undergone a C7-7 ACDF on 01/23/20. He reports initial improvement in RUE strength, but states that he has a gradual return of his RUE weakness. He was also having significant RLE weakness and pain and underwent and L5-S1 anterior lumbar interbody fusion on 03/25/20. Patient reports that he does not have any back precautions but needs to wear the low back brace for one more month. Patient reports he has a follow-up appointment with the surgeon next month. Patient reports he has a follow-up appointment with Dr. Brigitte Pulse, neurologist, in August. He arrives to therapy ambulating with an upright rollator walker. Pt denies any mention of concussion after his injury. He has been having cognitive issues since his injury and has previously had a heavy metals screen as well as neurocognitive testing. He has had an MRI of his brain with and without contrast on 12/18/19. Results were mildly motion degraded examination. No evidence of acute intracranial abnormality. Minimal chronic small vessel ischemic disease. He has tremor in both his hands. He has a positive family history of Parkinson's disease in his grandfather. He is complaining of constant dizziness and unsteadiness which are worse with activity. Patient states that he frequently loses his balance and his wife has to steady him.    Limitations Lifting;Standing;Walking;House hold activities    Diagnostic tests He has had an MRI of his brain with and without contrast on 12/18/19. Results were mildly motion degraded examination. No evidence of acute intracranial abnormality. Minimal chronic small vessel ischemic disease.    Patient Stated Goals to be able to ambulate with a cane or no AD, to be able to drive, to be able to ride his motorcycle    Currently in Pain? Yes    Pain Score 6     Pain Location Back    Pain  Orientation Lower;Mid    Pain Descriptors / Indicators Aching    Pain Type Acute pain;Chronic pain    Pain Onset More than a month ago    Pain Frequency Constant                     Treatment:   Grade II-III mobilizations CPA and UPA cervical mobilizations 4x 10 seconds each level  J mobilization for cervical pain reduction 4 x30 second holds  GTB hamstring curl 15x each LE GTB adduction 15x each LE Hedgehog lateral IR/ER 15x each LE  Ambulate 500 ft with changes surfaces, use of elevator, sidewalk, road. Use of walking stick and close CGA   Car transfer, challenge with guiding RLE into car requiring min A.   Mod/max A  transition into/out of prone: buckling of RLE noted requiring re-stabilization.    Trigger Point Dry Needling (TDN), unbilled Education performed with patient regarding potential benefit of TDN. Reviewed precautions and risks with patient. Reviewed special precautions/risks over lung fields which include pneumothorax. Reviewed signs and symptoms of pneumothorax and advised pt to go to ER immediately if these symptoms develop advise them of dry needling treatment. Extensive time spent with pt to ensure full understanding of TDN risks. Pt provided verbal consent to treatment. TDN performed to  with 0.25 x 40 single needle placements with local twitch response (LTR). Pistoning technique utilized. Improved pain-free motion following intervention. Muscles involved include: lumbar and thoracic paraspinals, bilateral upper trap. R piriformis x 8 minutes       Pt educated throughout session about proper posture and technique with exercises. Improved exercise technique, movement at target joints, use of target muscles after min to mod verbal, visual, tactile cues.  Patient reports pain at beginning of session in back and upper traps. Use of TDN and manual reduced pain, patient reports reduced neurological symptoms in RLE after TDN. Patient requires assistance  transitioning into/out of prone position with noted buckling of RLE requiring additional assistance to stop patient from experiencing LOB. The patient continues to benefit from additional skilled PT services to improve LE strength and balance for decreased fall risk and improved quality of life                   PT Education - 08/07/21 1455     Education Details exercise technique, body mechanics    Person(s) Educated Patient    Methods Explanation;Demonstration;Tactile cues;Verbal cues    Comprehension Verbalized understanding;Returned demonstration;Verbal cues required;Tactile cues required              PT Short Term Goals - 06/30/21 1746       PT SHORT TERM GOAL #1   Title Patient will be independent in home exercise program to improve strength/mobility for better functional independence with ADLs and for self-management.    Baseline 3/3 HEP compliant 3/31 hep compliant    Time 6    Period Weeks    Status Achieved    Target Date 02/20/21      PT SHORT TERM GOAL #2   Title Patient will be modified independent in walking on even/uneven surface with least restrictive assistive device, for 10+ minutes without rest break, reporting some difficulty or less to improve walking tolerance with community ambulation including grocery shopping, going to church,etc.    Baseline 02/03: instability, LOB multiple times while ambulating to elevator 3/31: unable to test due to throwing up 6/20: 6 minutes 7/12: unable to test due to migraine    Time 6    Period Weeks    Status Partially Met    Target Date 07/07/21      PT SHORT TERM GOAL #3   Title Patient will increase ABC scale score >80% to demonstrate better functional mobility and better confidence with ADLs.    Baseline 01/09/21: 60.625% 3/3/: 50% 3/31: 63% 6/20: 45% 7/25: 40%    Time 6    Period Weeks    Status On-going    Target Date 07/07/21               PT Long Term Goals - 06/30/21 1747       PT LONG  TERM GOAL #1   Title Patient will increase FOTO score to equal to or greater than 65 to demonstrate statistically  significant improvement in mobility and quality of life.    Baseline scored 41/100 on 05/21/2020; 7/22 40, 8/23: 60/100, 09/30/20=40, 01/09/21: 46 3/3: 40.3% 3/31: 49% 6/20: 45% 7/12: 50.7%    Time 12    Period Weeks    Status Partially Met      PT LONG TERM GOAL #2   Title Patient will reduce falls risk as indicated by decreased TUG time to less than 11 seconds.    Baseline scored 37.9 sec with TUG on 05/21/2020; 21seconds with elevated rollator 7/22, 8/23: 13.62 sec with up and go walker, 01/09/21: 15.81s with hiking stick 3/3: 17.98 seconds one near LOB 3/31: 15.57 seconds with walking stick 6/20: 14.8 seconds with walking stick. 7/25: 19.72 seconds with walking stick.    Time 12    Period Weeks    Status Partially Met      PT LONG TERM GOAL #3   Title Patient will increase right UE and LE gross strength to 4+/5 throughout to improve functional strength for independent gait, increased standing tolerance and increased ADL ability.    Baseline grossly +3/5 to -4/5 right UE and LE strength on 05/21/2020; grossly 4-/5 for RUE, grossly 4-/5 for RLE on 7/22, grossly 4-/5 RUE, grossly 4/5 RLE 3/3: see note    Time 8    Period Weeks    Status Partially Met      PT LONG TERM GOAL #4   Title Patient will increase 10 meter walk test to >1.82ms as to improve gait speed for better community ambulation and to reduce fall risk.    Baseline on 7/22 .57 m/sself selected, fast  .73 m/s with LOB pt reported R knee buckling, elevated rollator used, 8/23: 1.06 m/s with up and go walker, 01/09/21: 0.72 m/s with hiking stick 3/3: 0.74 m/s with walking stick 3/31: 0.81 m/s w walking stick 6/20: 1.1 m/s with walking stick    Time 12    Period Weeks    Status Achieved      PT LONG TERM GOAL #5   Title Patient will increase Berg Balance score by > 6 points to demonstrate decreased fall risk during  functional activities.    Baseline 8/23: 37/56, 09/30/20=40/56, 12/20: 42/56 3/3: deferred 3/31 will perform next time; deferred due to throwing up 6/20: 46/56 7/12 38/56    Time 8    Period Weeks    Status Achieved      PT LONG TERM GOAL #6   Title Patient (< 615years old) will complete five times sit to stand test in < 10 seconds indicating an increased LE strength and improved balance.    Baseline 8/23: 22 sec, 09/30/20=13.04 sec, 12/20: 27.05 sec, 01/09/21: 18.01s 3/3: 23 seconds no UE support 3/31; deferred due to patient throwing up 7/12: 15.2 seconds    Time 12    Period Weeks    Status Partially Met      PT LONG TERM GOAL #7   Title Patient will improve 6 min walk test >1000 feet with LRAD for improved gait ability in community.    Baseline 8/23: not tested; 8/30 845, goal adjusted >20037f 09/30/20=600 feet - stopped test due to R ankle catching making gait unsafe, 11/25/20= 330 feet - stopped test due to severe dizziness, vomiting/nausea, sweating making gait unsafe; 01/09/21: 720 ft with no rest breaks 3/3: deferred due to nausea 3/31: deferred due to patient throwing up 6/20: 640 ft 7/12: [id number] walking stick, no rest breaks;    Time  12    Period Weeks    Status Partially Met      PT LONG TERM GOAL #8   Title Patient will increase Berg Balance score by > 6 points  (52/56) to demonstrate decreased fall risk during functional activities.    Baseline 6/20: 46/56 7/12: 38/56 with migraine    Time 12    Period Weeks    Status On-going                   Plan - 08/07/21 1656     Clinical Impression Statement Patient reports pain at beginning of session in back and upper traps. Use of TDN and manual reduced pain, patient reports reduced neurological symptoms in RLE after TDN. Patient requires assistance transitioning into/out of prone position with noted buckling of RLE requiring additional assistance to stop patient from experiencing LOB. The patient continues to  benefit from additional skilled PT services to improve LE strength and balance for decreased fall risk and improved quality of life    Personal Factors and Comorbidities Comorbidity 3+;Time since onset of injury/illness/exacerbation    Comorbidities anxiety, back pain, COPD, headache, HTN, MI, sleep apnea, migraines    Examination-Activity Limitations Locomotion Level;Transfers;Bed Mobility;Stand;Stairs;Sleep;Squat;Bend    Examination-Participation Restrictions Driving;Medication Management    Stability/Clinical Decision Making Unstable/Unpredictable    Rehab Potential Fair    PT Frequency 2x / week    PT Duration 12 weeks    PT Treatment/Interventions ADLs/Self Care Home Management;Canalith Repostioning;Moist Heat;Electrical Stimulation;Gait training;Stair training;Functional mobility training;Therapeutic activities;Therapeutic exercise;Balance training;Neuromuscular re-education;Patient/family education;Manual techniques;Passive range of motion;Vestibular    PT Next Visit Plan dry needle upper trap, e stim to RLE for foot drop    PT Home Exercise Plan Medbridge Access Code: [email or web address] updated this session    Consulted and Agree with Plan of Care Patient;Other (Comment)   Friend            Patient will benefit from skilled therapeutic intervention in order to improve the following deficits and impairments:  Decreased activity tolerance, Decreased balance, Decreased cognition, Decreased mobility, Difficulty walking, Abnormal gait, Decreased range of motion, Dizziness, Pain, Impaired flexibility, Decreased strength, Impaired sensation, Decreased endurance, Decreased safety awareness  Visit Diagnosis: Muscle weakness (generalized)  Unsteadiness on feet  Dizziness and giddiness     Problem List Patient Active Problem List   Diagnosis Date Noted   Degenerative spondylolisthesis 03/25/2020   Physical deconditioning 03/04/2020   Lobar pneumonia, unspecified organism ([place]) 03/04/2020    Intractable hemiplegic migraine with status migrainosus 03/02/2020   Current tobacco use 02/05/2020   Abnormal findings on diagnostic imaging of lung 02/05/2020   Shortness of breath 02/05/2020   Herniated disc, cervical 01/23/2020   Cervical spondylosis with myelopathy and radiculopathy 01/23/2020   Pre-operative respiratory examination 01/12/2020   Weakness on right side of face 12/22/2019   Osteoarthritis of spine with radiculopathy, lumbar region 12/11/2019   Intractable migraine with aura with status migrainosus 11/09/2019   Former smoker 08/27/2018   Benign essential HTN 08/27/2018   Cough productive of purulent sputum 11/08/2017   GERD (gastroesophageal reflux disease) 05/06/2017   Contusion of hand 04/22/2017   BP (high blood pressure) 04/22/2017   Oxygen desaturation 04/22/2017   Prostate lump 04/22/2017   Unstable angina ([place]) 04/27/2016   Chest pain 04/24/2016   Morbid obesity ([place]) 01/29/2016   Elevated ALT measurement 10/30/2015   Nonspecific elevation of levels of transaminase and lactic acid dehydrogenase (ldh) 10/30/2015   Reduced libido 10/29/2015   Hyperlipidemia 08/06/2015  Anxiety 08/06/2015   Atherosclerosis of coronary artery 08/06/2015   Childhood asthma 08/06/2015   Chronic obstructive pulmonary disease ([place]) 08/06/2015   OSA (obstructive sleep apnea) 08/06/2015   Anxiety disorder 08/06/2015   Atherosclerosis of native coronary artery of native heart with stable angina pectoris ([place]) 83/29/1916   Uncomplicated asthma 60/60/0459    Janna Arch, PT, DPT  08/07/2021, 4:57 PM  [place] [facility]IN [facility] SERVICES [address] [place], [place], [address] Phone: [phone]   Fax:  [phone]  Name: Cory Espinoza MRN: [id number] Date of Birth: May 07, 1971

## 2021-08-14 ENCOUNTER — Ambulatory Visit: Payer: BC Managed Care – PPO

## 2021-08-18 ENCOUNTER — Ambulatory Visit: Payer: BC Managed Care – PPO

## 2021-08-18 ENCOUNTER — Other Ambulatory Visit: Payer: Self-pay

## 2021-08-18 DIAGNOSIS — R42 Dizziness and giddiness: Secondary | ICD-10-CM

## 2021-08-18 DIAGNOSIS — M6281 Muscle weakness (generalized): Secondary | ICD-10-CM | POA: Diagnosis not present

## 2021-08-18 DIAGNOSIS — R2681 Unsteadiness on feet: Secondary | ICD-10-CM

## 2021-08-18 NOTE — Therapy (Signed)
[place] MAIN [facility] SERVICES [place], [place], [address] Phone: [phone]   Fax:  [phone]  Physical Therapy Treatment/RECERT  Patient Details  Name: Cory Espinoza MRN: [id number] Date of Birth: 1971-11-06 Referring Provider (PT): Dr. Joselyn Arrow   Encounter Date: 08/18/2021   PT End of Session - 08/18/21 1629     Visit Number 7    Number of Visits 14    Date for PT Re-Evaluation 11/10/21    Authorization Type [employer] PPO; next session 7/10 PN 7/12    Authorization Time Period 01/09/21-04/03/21    PT Start Time 1515    PT Stop Time 1600    PT Time Calculation (min) 45 min    Equipment Utilized During Treatment Gait belt    Activity Tolerance Patient tolerated treatment well    Behavior During Therapy [facility] for tasks assessed/performed             Past Medical History:  Diagnosis Date   Allergy    Anginal pain ([place])    Anxiety    Arthritis    lumbar spine   Asthma    Atrial fibrillation (HCC)    Back pain    Severe Lumbar Pain   CHF (congestive heart failure) (HCC)    COPD (chronic obstructive pulmonary disease) ([place])    Restrictive lung disease   Coronary artery disease    Dyspnea    Dysrhythmia    afib   GERD (gastroesophageal reflux disease)    Headache    [name], onset October 2020, cluster headaches in the past   History of kidney stones    Hyperlipidemia    Hypertension    Myocardial infarction ([place])    at age 51   Pneumonia    Restrictive lung disease    Sleep apnea    unable to use cpap since onset of migraines    Past Surgical History:  Procedure Laterality Date   ABDOMINAL EXPOSURE N/A 03/25/2020   Procedure: ABDOMINAL EXPOSURE;  Surgeon: Rosetta Posner, MD;  Location: [facility] OR;  Service: Vascular;  Laterality: N/A;   ANTERIOR CERVICAL DECOMP/DISCECTOMY FUSION N/A 01/23/2020   Procedure: ANTERIOR CERVICAL DECOMPRESSION FUSION - CERVICAL SIX-CERVICAL SEVEN;  Surgeon: Earnie Larsson, MD;   Location: [place];  Service: Neurosurgery;  Laterality: N/A;   ANTERIOR LUMBAR FUSION N/A 03/25/2020   Procedure: ANTERIOR LUMBAR INTERBODY FUSION LUMBAR FIVE-SACRAL ONE.;  Surgeon: Earnie Larsson, MD;  Location: [place];  Service: Neurosurgery;  Laterality: N/A;  anterior   BACK SURGERY  1996   CARDIAC CATHETERIZATION Left 04/27/2016   Procedure: Left Heart Cath and Coronary Angiography;  Surgeon: Corey Skains, MD;  Location: [place] CV LAB;  Service: Cardiovascular;  Laterality: Left;   CARDIAC CATHETERIZATION N/A 04/27/2016   Procedure: Intravascular Pressure Wire/FFR Study;  Surgeon: Yolonda Kida, MD;  Location: [place] CV LAB;  Service: Cardiovascular;  Laterality: N/A;   CATHETERIZATION OF PULMONARY ARTERY WITH RETRIEVAL OF FOREIGN BODY Bilateral 04/07/2011   heart   DEGENERATIVE SPONDYLOLISTHESIS     KNEE ARTHROSCOPY Bilateral 2004   LUMBAR DISC SURGERY  1999   RADICULOPATHY, CERVICAL REGION     TONSILLECTOMY      There were no vitals filed for this visit.   Subjective Assessment - 08/18/21 1628     Subjective Patient presents with severe migraine to PT session. Reports lower back continues to be painful at this time.    Patient is accompained by: Family member  Pertinent History Pt has a complex medical history. He reports that he fell during a [employer] training in [place] in the fall of 2020. He states that he fell 10-12 feet and struck his head, neck, and back. He was able to finish the course which took him approximately 45 minutes more to complete. He does endorse a second fall while finishing the course but did not suffer a second head injury. He states that he started getting headaches that day which have persisted since that time. 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 He does report improvement in his headaches recently. Neurology was concerned about possible BPPV and have referred him for a  vestibular evaluation. In addition since the injury, pt developed RUE and RLE pain and weakness. Pt states that NCV showed abnormal nerve conduction in RUE. He has since undergone a C7-7 ACDF on 01/23/20. He reports initial improvement in RUE strength, but states that he has a gradual return of his RUE weakness. He was also having significant RLE weakness and pain and underwent and L5-S1 anterior lumbar interbody fusion on 03/25/20. Patient reports that he does not have any back precautions but needs to wear the low back brace for one more month. Patient reports he has a follow-up appointment with the surgeon next month. Patient reports he has a follow-up appointment with Dr. Brigitte Pulse, neurologist, in August. He arrives to therapy ambulating with an upright rollator walker. Pt denies any mention of concussion after his injury. He has been having cognitive issues since his injury and has previously had a heavy metals screen as well as neurocognitive testing. He has had an MRI of his brain with and without contrast on 12/18/19. Results were mildly motion degraded examination. No evidence of acute intracranial abnormality. Minimal chronic small vessel ischemic disease. He has tremor in both his hands. He has a positive family history of Parkinson's disease in his grandfather. He is complaining of constant dizziness and unsteadiness which are worse with activity. Patient states that he frequently loses his balance and his wife has to steady him.    Limitations Lifting;Standing;Walking;House hold activities    Diagnostic tests He has had an MRI of his brain with and without contrast on 12/18/19. Results were mildly motion degraded examination. No evidence of acute intracranial abnormality. Minimal chronic small vessel ischemic disease.    Patient Stated Goals to be able to ambulate with a cane or no AD, to be able to drive, to be able to ride his motorcycle    Currently in Pain? Yes    Pain Score 8     Pain Location Head     Pain Descriptors / Indicators Aching;Headache;Pounding    Pain Type Chronic pain;Neuropathic pain    Pain Onset More than a month ago    Pain Frequency Constant              Goals:  Walk 10 minutes-defer due to migraine ABC-51.1% FOTO-44.7%  TUG-defer to next session RUE/RLE strength 5x STS-defer to next session 6 min walk test -defer to next session BERG-defer to next session   Treatment:    in room with lights off and noise damped due to migraine.   TherEx 4lb weight:cues for body mechanics sequencing with tactile cue for full arc of motion -LAQ 15x each LE: very fatiguing; 3 second holds  -March 15x each LE -[id number]    RTB: -adduction against PT resistance 15x each LE -  hamstring curl against PT resistance 10x each LE    Ambulate 190 ft with walking stick and close CGA, multiple episodes of R knee buckling requiring PT to assist patient to remain standing.   Car transfer with Min A to RLE to bring into chair   Manual:  STM to bilateral upper traps, levator scap, and cervical paraspinals x 13 minutes, implementation of effleurage and ptrissage included.  patient reports decreased headache pain by end of session.  Pt educated throughout session about proper posture and technique with exercises. Improved exercise technique, movement at target joints, use of target muscles after min to mod verbal, visual, tactile cues    Pt educated throughout session about proper posture and technique with exercises. Improved exercise technique, movement at target joints, use of target muscles after min to mod verbal, visual, tactile cues.  Patient's recert limited by severe migraine. Will perform majority of goals next session. However by examining patient's recent goal performance since last recert he is progressing with slow steady progress with functional mobility and stability. He is severely limited on days with bad migraines however is improving with functional  stability on days of lesser migraines allowing for prolonged ambulation and increased stability tolerance. Patient's headache did increase a little by end of session requiring PT to assist patient back upstairs to car. The patient continues to benefit from additional skilled PT services to improve LE strength and balance for decreased fall risk and improved quality of life                    PT Education - 08/18/21 1629     Education Details recert, body mechanics, goals, POC    Person(s) Educated Patient    Methods Explanation;Demonstration;Tactile cues;Verbal cues    Comprehension Verbalized understanding;Returned demonstration;Verbal cues required;Tactile cues required              PT Short Term Goals - 08/18/21 1636       PT SHORT TERM GOAL #1   Title Patient will be independent in home exercise program to improve strength/mobility for better functional independence with ADLs and for self-management.    Baseline 3/3 HEP compliant 3/31 hep compliant    Time 6    Period Weeks    Status Achieved    Target Date 02/20/21      PT SHORT TERM GOAL #2   Title Patient will be modified independent in walking on even/uneven surface with least restrictive assistive device, for 10+ minutes without rest break, reporting some difficulty or less to improve walking tolerance with community ambulation including grocery shopping, going to church,etc.    Baseline 02/03: instability, LOB multiple times while ambulating to elevator 3/31: unable to test due to throwing up 6/20: 6 minutes 7/12: unable to test due to migraine 9/12: defer to next session    Time 6    Period Weeks    Status Partially Met    Target Date 09/29/21      PT SHORT TERM GOAL #3   Title Patient will increase ABC scale score >80% to demonstrate better functional mobility and better confidence with ADLs.    Baseline 01/09/21: 60.625% 3/3/: 50% 3/31: 63% 6/20: 45% 7/25: 40% 9/12: 51.1%    Time 6    Period Weeks     Status Partially Met    Target Date 09/29/21               PT Long Term Goals - 08/18/21 1636  PT LONG TERM GOAL #1   Title Patient will increase FOTO score to equal to or greater than 65 to demonstrate statistically significant improvement in mobility and quality of life.    Baseline scored 41/100 on 05/21/2020; 7/22 40, 8/23: 60/100, 09/30/20=40, 01/09/21: 46 3/3: 40.3% 3/31: 49% 6/20: 45% 7/12: 50.7% 9/12: 44.7%    Time 12    Period Weeks    Status Partially Met    Target Date 11/10/21      PT LONG TERM GOAL #2   Title Patient will reduce falls risk as indicated by decreased TUG time to less than 11 seconds.    Baseline scored 37.9 sec with TUG on 05/21/2020; 21seconds with elevated rollator 7/22, 8/23: 13.62 sec with up and go walker, 01/09/21: 15.81s with hiking stick 3/3: 17.98 seconds one near LOB 3/31: 15.57 seconds with walking stick 6/20: 14.8 seconds with walking stick. 7/25: 19.72 seconds with walking stick.    Time 12    Period Weeks    Status Partially Met    Target Date 11/10/21      PT LONG TERM GOAL #3   Title Patient will increase right UE and LE gross strength to 4+/5 throughout to improve functional strength for independent gait, increased standing tolerance and increased ADL ability.    Baseline grossly +3/5 to -4/5 right UE and LE strength on 05/21/2020; grossly 4-/5 for RUE, grossly 4-/5 for RLE on 7/22, grossly 4-/5 RUE, grossly 4/5 RLE 3/3: see note    Time 12    Period Weeks    Status Partially Met    Target Date 11/10/21      PT LONG TERM GOAL #4   Title Patient will increase 10 meter walk test to >1.51ms as to improve gait speed for better community ambulation and to reduce fall risk.    Baseline on 7/22 .57 m/sself selected, fast  .73 m/s with LOB pt reported R knee buckling, elevated rollator used, 8/23: 1.06 m/s with up and go walker, 01/09/21: 0.72 m/s with hiking stick 3/3: 0.74 m/s with walking stick 3/31: 0.81 m/s w walking stick 6/20:  1.1 m/s with walking stick    Time 12    Period Weeks    Status Achieved      PT LONG TERM GOAL #5   Title Patient will increase Berg Balance score by > 6 points to demonstrate decreased fall risk during functional activities.    Baseline 8/23: 37/56, 09/30/20=40/56, 12/20: 42/56 3/3: deferred 3/31 will perform next time; deferred due to throwing up 6/20: 46/56 7/12 38/56    Time 8    Period Weeks    Status Achieved      PT LONG TERM GOAL #6   Title Patient (< 611years old) will complete five times sit to stand test in < 10 seconds indicating an increased LE strength and improved balance.    Baseline 8/23: 22 sec, 09/30/20=13.04 sec, 12/20: 27.05 sec, 01/09/21: 18.01s 3/3: 23 seconds no UE support 3/31; deferred due to patient throwing up 7/12: 15.2 seconds    Time 12    Period Weeks    Status Partially Met    Target Date 11/10/21      PT LONG TERM GOAL #7   Title Patient will improve 6 min walk test >1000 feet with LRAD for improved gait ability in community.    Baseline 8/23: not tested; 8/30 845, goal adjusted >20053f 09/30/20=600 feet - stopped test due to R ankle catching making gait unsafe,  11/25/20= 330 feet - stopped test due to severe dizziness, vomiting/nausea, sweating making gait unsafe; 01/09/21: 720 ft with no rest breaks 3/3: deferred due to nausea 3/31: deferred due to patient throwing up 6/20: 640 ft 7/12: 645f with walking stick, no rest breaks;    Time 12    Period Weeks    Status Partially Met    Target Date 11/10/21      PT LONG TERM GOAL #8   Title Patient will increase Berg Balance score by > 6 points  (52/56) to demonstrate decreased fall risk during functional activities.    Baseline 6/20: 46/56 7/12: 38/56 with migraine 9/12: defer to next session    Time 12    Period Weeks    Status On-going    Target Date 11/10/21                   Plan - 08/18/21 1641     Clinical Impression Statement Patient's recert limited by severe migraine. Will  perform majority of goals next session. However by examining patient's recent goal performance since last recert he is progressing with slow steady progress with functional mobility and stability. He is severely limited on days with bad migraines however is improving with functional stability on days of lesser migraines allowing for prolonged ambulation and increased stability tolerance. Patient's headache did increase a little by end of session requiring PT to assist patient back upstairs to car. The patient continues to benefit from additional skilled PT services to improve LE strength and balance for decreased fall risk and improved quality of life    Personal Factors and Comorbidities Comorbidity 3+;Time since onset of injury/illness/exacerbation    Comorbidities anxiety, back pain, COPD, headache, HTN, MI, sleep apnea, migraines    Examination-Activity Limitations Locomotion Level;Transfers;Bed Mobility;Stand;Stairs;Sleep;Squat;Bend    Examination-Participation Restrictions Driving;Medication Management    Stability/Clinical Decision Making Unstable/Unpredictable    Rehab Potential Fair    PT Frequency 2x / week    PT Duration 12 weeks    PT Treatment/Interventions ADLs/Self Care Home Management;Canalith Repostioning;Moist Heat;Electrical Stimulation;Gait training;Stair training;Functional mobility training;Therapeutic activities;Therapeutic exercise;Balance training;Neuromuscular re-education;Patient/family education;Manual techniques;Passive range of motion;Vestibular    PT Next Visit Plan 6 min walk test, TUG, BERG    PT Home Exercise Plan Medbridge Access Code: [email or web address] updated this session    Consulted and Agree with Plan of Care Patient;Other (Comment)   Friend            Patient will benefit from skilled therapeutic intervention in order to improve the following deficits and impairments:  Decreased activity tolerance, Decreased balance, Decreased cognition, Decreased mobility,  Difficulty walking, Abnormal gait, Decreased range of motion, Dizziness, Pain, Impaired flexibility, Decreased strength, Impaired sensation, Decreased endurance, Decreased safety awareness  Visit Diagnosis: Muscle weakness (generalized)  Unsteadiness on feet  Dizziness and giddiness     Problem List Patient Active Problem List   Diagnosis Date Noted   Degenerative spondylolisthesis 03/25/2020   Physical deconditioning 03/04/2020   Lobar pneumonia, unspecified organism ([place] 03/04/2020   Intractable hemiplegic migraine with status migrainosus 03/02/2020   Current tobacco use 02/05/2020   Abnormal findings on diagnostic imaging of lung 02/05/2020   Shortness of breath 02/05/2020   Herniated disc, cervical 01/23/2020   Cervical spondylosis with myelopathy and radiculopathy 01/23/2020   Pre-operative respiratory examination 01/12/2020   Weakness on right side of face 12/22/2019   Osteoarthritis of spine with radiculopathy, lumbar region 12/11/2019   Intractable migraine with aura with status migrainosus 11/09/2019   Former  smoker 08/27/2018   Benign essential HTN 08/27/2018   Cough productive of purulent sputum 11/08/2017   GERD (gastroesophageal reflux disease) 05/06/2017   Contusion of hand 04/22/2017   BP (high blood pressure) 04/22/2017   Oxygen desaturation 04/22/2017   Prostate lump 04/22/2017   Unstable angina ([place]) 04/27/2016   Chest pain 04/24/2016   Morbid obesity ([place]) 01/29/2016   Elevated ALT measurement 10/30/2015   Nonspecific elevation of levels of transaminase and lactic acid dehydrogenase (ldh) 10/30/2015   Reduced libido 10/29/2015   Hyperlipidemia 08/06/2015   Anxiety 08/06/2015   Atherosclerosis of coronary artery 08/06/2015   Childhood asthma 08/06/2015   Chronic obstructive pulmonary disease ([place]) 08/06/2015   OSA (obstructive sleep apnea) 08/06/2015   Anxiety disorder 08/06/2015   Atherosclerosis of native coronary artery of native heart with  stable angina pectoris ([place]) 97/18/2099   Uncomplicated asthma 06/89/3406   Janna Arch, PT, DPT  08/18/2021, 4:43 PM  [place] MAIN [facility] SERVICES [address] [place], [place], [address] Phone: [phone]   Fax:  [phone]  Name: Cory Espinoza MRN: [id number] Date of Birth: Dec 05, 1971

## 2021-08-21 ENCOUNTER — Ambulatory Visit: Payer: BC Managed Care – PPO

## 2021-08-25 ENCOUNTER — Ambulatory Visit: Payer: BC Managed Care – PPO

## 2021-08-25 ENCOUNTER — Other Ambulatory Visit: Payer: Self-pay

## 2021-08-25 DIAGNOSIS — R42 Dizziness and giddiness: Secondary | ICD-10-CM

## 2021-08-25 DIAGNOSIS — R2681 Unsteadiness on feet: Secondary | ICD-10-CM

## 2021-08-25 DIAGNOSIS — M6281 Muscle weakness (generalized): Secondary | ICD-10-CM

## 2021-08-25 NOTE — Therapy (Signed)
[place] MAIN [facility] SERVICES [address] [place], [place], [address] Phone: [phone]   Fax:  [phone]  Physical Therapy Treatment  Patient Details  Name: Cory Espinoza MRN: [id number] Date of Birth: [birth year] Referring Provider (PT): Dr. Joselyn Arrow   Encounter Date: 08/25/2021   PT End of Session - 08/25/21 1537     Visit Number 20    Number of Visits 48    Date for PT Re-Evaluation 11/10/21    Authorization Type [employer] PPO;    PT Start Time 1522    PT Stop Time 1600    PT Time Calculation (min) 38 min    Equipment Utilized During Treatment Gait belt    Activity Tolerance Patient tolerated treatment well    Behavior During Therapy WFL for tasks assessed/performed             Past Medical History:  Diagnosis Date   Allergy    Anginal pain (HCC)    Anxiety    Arthritis    lumbar spine   Asthma    Atrial fibrillation (HCC)    Back pain    Severe Lumbar Pain   CHF (congestive heart failure) (HCC)    COPD (chronic obstructive pulmonary disease) ([place])    Restrictive lung disease   Coronary artery disease    Dyspnea    Dysrhythmia    afib   GERD (gastroesophageal reflux disease)    Headache    [name], onset October 2020, cluster headaches in the past   History of kidney stones    Hyperlipidemia    Hypertension    Myocardial infarction ([place])    at age 50   Pneumonia    Restrictive lung disease    Sleep apnea    unable to use cpap since onset of migraines    Past Surgical History:  Procedure Laterality Date   ABDOMINAL EXPOSURE N/A 03/25/2020   Procedure: ABDOMINAL EXPOSURE;  Surgeon: Rosetta Posner, MD;  Location: [facility] OR;  Service: Vascular;  Laterality: N/A;   ANTERIOR CERVICAL DECOMP/DISCECTOMY FUSION N/A 01/23/2020   Procedure: ANTERIOR CERVICAL DECOMPRESSION FUSION - CERVICAL SIX-CERVICAL SEVEN;  Surgeon: Earnie Larsson, MD;  Location: [place];  Service: Neurosurgery;  Laterality: N/A;   ANTERIOR LUMBAR  FUSION N/A 03/25/2020   Procedure: ANTERIOR LUMBAR INTERBODY FUSION LUMBAR FIVE-SACRAL ONE.;  Surgeon: Earnie Larsson, MD;  Location: [place];  Service: Neurosurgery;  Laterality: N/A;  anterior   BACK SURGERY  1996   CARDIAC CATHETERIZATION Left 04/27/2016   Procedure: Left Heart Cath and Coronary Angiography;  Surgeon: Corey Skains, MD;  Location: [place] CV LAB;  Service: Cardiovascular;  Laterality: Left;   CARDIAC CATHETERIZATION N/A 04/27/2016   Procedure: Intravascular Pressure Wire/FFR Study;  Surgeon: Yolonda Kida, MD;  Location: [place] CV LAB;  Service: Cardiovascular;  Laterality: N/A;   CATHETERIZATION OF PULMONARY ARTERY WITH RETRIEVAL OF FOREIGN BODY Bilateral 04/07/2011   heart   DEGENERATIVE SPONDYLOLISTHESIS     KNEE ARTHROSCOPY Bilateral 2004   LUMBAR DISC SURGERY  1999   RADICULOPATHY, CERVICAL REGION     TONSILLECTOMY      There were no vitals filed for this visit.   Subjective Assessment - 08/25/21 1528     Subjective Pt reports to PT this date for OPPT treatment; wife reports seizure yesterday for ~30sec, today has had no memory recall from previous day. Wife obtained video of the episode at request of neurologist. Pt is more fatigued today.  Pertinent History Pt has a complex medical history. 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 He states that he fell 10-12 feet and struck his head, neck, and back. He was able to finish the course which took him approximately 45 minutes more to complete. He does endorse a second fall while finishing the course but did not suffer a second head injury. He states that he started getting headaches that day which have persisted since that time. 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 He does report improvement in his headaches recently. Neurology was concerned about possible BPPV and have referred him for a  vestibular evaluation. In addition since the injury, pt developed RUE and RLE pain and weakness. Pt states that NCV showed abnormal nerve conduction in RUE. He has since undergone a C7-7 ACDF on 01/23/20. He reports initial improvement in RUE strength, but states that he has a gradual return of his RUE weakness. He was also having significant RLE weakness and pain and underwent and L5-S1 anterior lumbar interbody fusion on 03/25/20. Patient reports that he does not have any back precautions but needs to wear the low back brace for one more month. Patient reports he has a follow-up appointment with the surgeon next month. Patient reports he has a follow-up appointment with Dr. Brigitte Pulse, neurologist, in 50 He arrives to therapy ambulating with an upright rollator walker. Pt denies any mention of concussion after his injury. He has been having cognitive issues since his injury and has previously had a heavy metals screen as well as neurocognitive testing. He has had an MRI of his brain with and without contrast on 12/18/19. Results were mildly motion degraded examination. No evidence of acute intracranial abnormality. Minimal chronic small vessel ischemic disease. He has tremor in both his hands. He has a positive family history of Parkinson's disease in his grandfather. He is complaining of constant dizziness and unsteadiness which are worse with activity. Patient states that he frequently loses his balance and his wife has to steady him.    Currently in Pain? No/denies             INTERVENTION THIS DATE:  -Overground AMB with LUE walking stick 4 laps for 684f, ceased after concerning level of fatigue in legs, progression of dizziness,  *walks into doorframe x1 due visual limitations  -5 minutes supine with emesis bag, vitals established  115/761mg 76bpm Supine 129/8725m 78bpm Seated  131/88m80m 138/83mm63m3bpm post AMB  -AMB 300ft 48fground clockwise, walking staff   -Seated LAQ 1x15 bilat;  :Left @ 5lb, Rt @4lb      PT Short Term Goals - 08/18/21 1636       PT SHORT TERM GOAL #1   Title Patient will be independent in home exercise program to improve strength/mobility for better functional independence with ADLs and for self-management.    Baseline 3/3 HEP compliant 3/31 hep compliant    Time 6    Period Weeks    Status Achieved    Target Date 02/20/21      PT SHORT TERM GOAL #2   Title Patient will be modified independent in walking on even/uneven surface with least restrictive assistive device, for 10+ minutes without rest break, reporting some difficulty or less to improve walking tolerance with community ambulation including grocery shopping, going to church,etc.    Baseline 02/03: instability, LOB multiple times while ambulating  to elevator 3/31: unable to test due to throwing up 6/20: 6 minutes 7/12: unable to test due to migraine 9/12: defer to next session    Time 6    Period Weeks    Status Partially Met    Target Date 09/29/21      PT SHORT TERM GOAL #3   Title Patient will increase ABC scale score >80% to demonstrate better functional mobility and better confidence with ADLs.    Baseline 01/09/21: 60.625% 3/3/: 50% 3/31: 63% 6/20: 45% 7/25: 40% 9/12: 51.1%    Time 6    Period Weeks    Status Partially Met    Target Date 09/29/21               PT Long Term Goals - 08/18/21 1636       PT LONG TERM GOAL #1   Title Patient will increase FOTO score to equal to or greater than 65 to demonstrate statistically significant improvement in mobility and quality of life.    Baseline scored 41/100 on 05/21/2020; 7/22 40, 8/23: 60/100, 09/30/20=40, 01/09/21: 46 3/3: 40.3% 3/31: 49% 6/20: 45% 7/12: 50.7% 9/12: 44.7%    Time 12    Period Weeks    Status Partially Met    Target Date 11/10/21      PT LONG TERM GOAL #2   Title Patient will reduce falls risk as indicated by decreased TUG time to less than 11 seconds.    Baseline scored 37.9 sec with TUG on  05/21/2020; 21seconds with elevated rollator 7/22, 8/23: 13.62 sec with up and go walker, 01/09/21: 15.81s with hiking stick 3/3: 17.98 seconds one near LOB 3/31: 15.57 seconds with walking stick 6/20: 14.8 seconds with walking stick. 7/25: 19.72 seconds with walking stick.    Time 12    Period Weeks    Status Partially Met    Target Date 11/10/21      PT LONG TERM GOAL #3   Title Patient will increase right UE and LE gross strength to 4+/5 throughout to improve functional strength for independent gait, increased standing tolerance and increased ADL ability.    Baseline grossly +3/5 to -4/5 right UE and LE strength on 05/21/2020; grossly 4-/5 for RUE, grossly 4-/5 for RLE on 7/22, grossly 4-/5 RUE, grossly 4/5 RLE 3/3: see note    Time 12    Period Weeks    Status Partially Met    Target Date 11/10/21      PT LONG TERM GOAL #4   Title Patient will increase 10 meter walk test to >1.80ms as to improve gait speed for better community ambulation and to reduce fall risk.    Baseline on 7/22 .57 m/sself selected, fast  .73 m/s with LOB pt reported R knee buckling, elevated rollator used, 8/23: 1.06 m/s with up and go walker, 01/09/21: 0.72 m/s with hiking stick 3/3: 0.74 m/s with walking stick 3/31: 0.81 m/s w walking stick 6/20: 1.1 m/s with walking stick    Time 12    Period Weeks    Status Achieved      PT LONG TERM GOAL #5   Title Patient will increase Berg Balance score by > 6 points to demonstrate decreased fall risk during functional activities.    Baseline 8/23: 37/56, 09/30/20=40/56, 12/20: 42/56 3/3: deferred 3/31 will perform next time; deferred due to throwing up 6/20: 46/56 7/12 38/56    Time 8    Period Weeks    Status Achieved  PT LONG TERM GOAL #6   Title Patient (< 30 years old) will complete five times sit to stand test in < 10 seconds indicating an increased LE strength and improved balance.    Baseline 8/23: 22 sec, 09/30/20=13.04 sec, 12/20: 27.05 sec, 01/09/21: 18.01s  3/3: 23 seconds no UE support 3/31; deferred due to patient throwing up 7/12: 15.2 seconds    Time 12    Period Weeks    Status Partially Met    Target Date 11/10/21      PT LONG TERM GOAL #7   Title Patient will improve 6 min walk test >1000 feet with LRAD for improved gait ability in community.    Baseline 8/23: not tested; 8/30 845, goal adjusted >2042f, 09/30/20=600 feet - stopped test due to R ankle catching making gait unsafe, 11/25/20= 330 feet - stopped test due to severe dizziness, vomiting/nausea, sweating making gait unsafe; 01/09/21: 720 ft with no rest breaks 3/3: deferred due to nausea 3/31: deferred due to patient throwing up 6/20: 640 ft 7/12: 6665fwith walking stick, no rest breaks;    Time 12    Period Weeks    Status Partially Met    Target Date 11/10/21      PT LONG TERM GOAL #8   Title Patient will increase Berg Balance score by > 6 points  (52/56) to demonstrate decreased fall risk during functional activities.    Baseline 6/20: 46/56 7/12: 38/56 with migraine 9/12: defer to next session    Time 12    Period Weeks    Status On-going    Target Date 11/10/21                   Plan - 08/25/21 1538     Clinical Impression Statement Continued with current plan of care as laid out in evaluation and recent prior sessions. Pt remains motivated to advance progress toward goals, but is limited by easy exacerbation of symptoms. Recovery breaks provided as needed, mostly for nausea and increased dizziness. Pt requires close assistance for safety given his regularly LOB and heavy sway.     Personal Factors and Comorbidities Comorbidity 3+;Time since onset of injury/illness/exacerbation    Comorbidities anxiety, back pain, COPD, headache, HTN, MI, sleep apnea, migraines    Examination-Activity Limitations Locomotion Level;Transfers;Bed Mobility;Stand;Stairs;Sleep;Squat;Bend    Examination-Participation Restrictions Driving;Medication Management    Stability/Clinical  Decision Making Unstable/Unpredictable    Clinical Decision Making High    Rehab Potential Fair    PT Frequency 1x / week    PT Duration 12 weeks    PT Treatment/Interventions ADLs/Self Care Home Management;Canalith Repostioning;Moist Heat;Electrical Stimulation;Gait training;Stair training;Functional mobility training;Therapeutic activities;Therapeutic exercise;Balance training;Neuromuscular re-education;Patient/family education;Manual techniques;Passive range of motion;Vestibular    PT Next Visit Plan Cotninue with functional mobility training and strengthening;    PT Home Exercise Plan Medbridge Access Code: [email or web address]    Consulted and Agree with Plan of Care Patient;Family member/caregiver    Family Member Consulted Wife             Patient will benefit from skilled therapeutic intervention in order to improve the following deficits and impairments:  Decreased activity tolerance, Decreased balance, Decreased cognition, Decreased mobility, Difficulty walking, Abnormal gait, Decreased range of motion, Dizziness, Pain, Impaired flexibility, Decreased strength, Impaired sensation, Decreased endurance, Decreased safety awareness  Visit Diagnosis: Muscle weakness (generalized)  Unsteadiness on feet  Dizziness and giddiness     Problem List Patient Active Problem List   Diagnosis Date Noted   Degenerative  spondylolisthesis 03/25/2020   Physical deconditioning 03/04/2020   Lobar pneumonia, unspecified organism ([place]) 03/04/2020   Intractable hemiplegic migraine with status migrainosus 03/02/2020   Current tobacco use 02/05/2020   Abnormal findings on diagnostic imaging of lung 02/05/2020   Shortness of breath 02/05/2020   Herniated disc, cervical 01/23/2020   Cervical spondylosis with myelopathy and radiculopathy 01/23/2020   Pre-operative respiratory examination 01/12/2020   Weakness on right side of face 12/22/2019   Osteoarthritis of spine with radiculopathy, lumbar region  12/11/2019   Intractable migraine with aura with status migrainosus 11/09/2019   Former smoker 08/27/2018   Benign essential HTN 08/27/2018   Cough productive of purulent sputum 11/08/2017   GERD (gastroesophageal reflux disease) 05/06/2017   Contusion of hand 04/22/2017   BP (high blood pressure) 04/22/2017   Oxygen desaturation 04/22/2017   Prostate lump 04/22/2017   Unstable angina ([place]) 04/27/2016   Chest pain 04/24/2016   Morbid obesity ([place]) 01/29/2016   Elevated ALT measurement 10/30/2015   Nonspecific elevation of levels of transaminase and lactic acid dehydrogenase (ldh) 10/30/2015   Reduced libido 10/29/2015   Hyperlipidemia 08/06/2015   Anxiety 08/06/2015   Atherosclerosis of coronary artery 08/06/2015   Childhood asthma 08/06/2015   Chronic obstructive pulmonary disease ([place]) 08/06/2015   OSA (obstructive sleep apnea) 08/06/2015   Anxiety disorder 08/06/2015   Atherosclerosis of native coronary artery of native heart with stable angina pectoris ([place]) 47/68/9155   Uncomplicated asthma 25/36/4838   5:59 PM, 08/25/21 Etta Grandchild, PT, DPT Physical Therapist - [facility]  Outpatient Physical Therapy- [place] [phone]     [place], PT 08/25/2021, 3:44 PM  [place] MAIN [facility] SERVICES [address] [place], [place], [address] Phone: [phone]   Fax:  [phone]  Name: EZEL VALLONE MRN: [id number] Date of Birth: Oct 21, 1971

## 2021-08-28 ENCOUNTER — Ambulatory Visit: Payer: BC Managed Care – PPO

## 2021-09-01 ENCOUNTER — Ambulatory Visit: Payer: BC Managed Care – PPO

## 2021-09-01 ENCOUNTER — Other Ambulatory Visit: Payer: Self-pay

## 2021-09-01 DIAGNOSIS — M6281 Muscle weakness (generalized): Secondary | ICD-10-CM | POA: Diagnosis not present

## 2021-09-01 DIAGNOSIS — R2681 Unsteadiness on feet: Secondary | ICD-10-CM

## 2021-09-01 DIAGNOSIS — R42 Dizziness and giddiness: Secondary | ICD-10-CM

## 2021-09-01 NOTE — Therapy (Signed)
[place] MAIN [facility] SERVICES [address] [place], [place], [address] Phone: [phone]   Fax:  [phone]  Physical Therapy Treatment  Patient Details  Name: Cory Espinoza MRN: [id number] Date of Birth: 31-Mar-1971 Referring Provider (PT): Dr. Joselyn Arrow   Encounter Date: 09/01/2021   PT End of Session - 09/01/21 1707     Visit Number 29    Number of Visits 54    Date for PT Re-Evaluation 11/10/21    Authorization Type BCBS PPO;    PT Start Time 1520    PT Stop Time 1600    PT Time Calculation (min) 40 min    Equipment Utilized During Treatment Gait belt    Activity Tolerance Patient tolerated treatment well;Treatment limited secondary to medical complications (Comment)   provoked symptoms with certain activities   Behavior During Therapy [facility] for tasks assessed/performed             Past Medical History:  Diagnosis Date   Allergy    Anginal pain (HCC)    Anxiety    Arthritis    lumbar spine   Asthma    Atrial fibrillation (HCC)    Back pain    Severe Lumbar Pain   CHF (congestive heart failure) (HCC)    COPD (chronic obstructive pulmonary disease) ([place])    Restrictive lung disease   Coronary artery disease    Dyspnea    Dysrhythmia    afib   GERD (gastroesophageal reflux disease)    Headache    [name], onset October 2020, cluster headaches in the past   History of kidney stones    Hyperlipidemia    Hypertension    Myocardial infarction ([place])    at age 51   Pneumonia    Restrictive lung disease    Sleep apnea    unable to use cpap since onset of migraines    Past Surgical History:  Procedure Laterality Date   ABDOMINAL EXPOSURE N/A 03/25/2020   Procedure: ABDOMINAL EXPOSURE;  Surgeon: Rosetta Posner, MD;  Location: [facility] OR;  Service: Vascular;  Laterality: N/A;   ANTERIOR CERVICAL DECOMP/DISCECTOMY FUSION N/A 01/23/2020   Procedure: ANTERIOR CERVICAL DECOMPRESSION FUSION - CERVICAL SIX-CERVICAL SEVEN;   Surgeon: Earnie Larsson, MD;  Location: [place];  Service: Neurosurgery;  Laterality: N/A;   ANTERIOR LUMBAR FUSION N/A 03/25/2020   Procedure: ANTERIOR LUMBAR INTERBODY FUSION LUMBAR FIVE-SACRAL ONE.;  Surgeon: Earnie Larsson, MD;  Location: [place];  Service: Neurosurgery;  Laterality: N/A;  anterior   BACK SURGERY  1996   CARDIAC CATHETERIZATION Left 04/27/2016   Procedure: Left Heart Cath and Coronary Angiography;  Surgeon: Corey Skains, MD;  Location: [place] [facility];  Service: Cardiovascular;  Laterality: Left;   CARDIAC CATHETERIZATION N/A 04/27/2016   Procedure: Intravascular Pressure Wire/FFR Study;  Surgeon: Yolonda Kida, MD;  Location: [place] [facility];  Service: Cardiovascular;  Laterality: N/A;   CATHETERIZATION OF PULMONARY ARTERY WITH RETRIEVAL OF FOREIGN BODY Bilateral 04/07/2011   heart   DEGENERATIVE SPONDYLOLISTHESIS     KNEE ARTHROSCOPY Bilateral 2004   LUMBAR DISC SURGERY  1999   RADICULOPATHY, CERVICAL REGION     TONSILLECTOMY      There were no vitals filed for this visit.   Subjective Assessment - 09/01/21 1704     Subjective Pt doing well today, no seizure actiivty since prior visit. Pt's migraine HA well controlled at arrival ~ 2/10, minimal nausea.    Pertinent History Pt has  a complex medical history. 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 He states that he fell 10-12 feet and struck his head, neck, and back. He was able to finish the course which took him approximately 45 minutes more to complete. He does endorse a second fall while finishing the course but did not suffer a second head injury. He states that he started getting headaches that day which have persisted since that time. 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 He does report improvement in his headaches recently. Neurology was concerned about possible BPPV and have referred him for a  vestibular evaluation. In addition since the injury, pt developed RUE and RLE pain and weakness. Pt states that NCV showed abnormal nerve conduction in RUE. He has since undergone a C7-7 ACDF on 01/23/20. He reports initial improvement in RUE strength, but states that he has a gradual return of his RUE weakness. He was also having significant RLE weakness and pain and underwent and L5-S1 anterior lumbar interbody fusion on 03/25/20. Patient reports that he does not have any back precautions but needs to wear the low back brace for one more month. Patient reports he has a follow-up appointment with the surgeon next month. Patient reports he has a follow-up appointment with Dr. Brigitte Pulse, neurologist, in August. He arrives to therapy ambulating with an upright rollator walker. Pt denies any mention of concussion after his injury. He has been having cognitive issues since his injury and has previously had a heavy metals screen as well as neurocognitive testing. He has had an MRI of his brain with and without contrast on 12/18/19. Results were mildly motion degraded examination. No evidence of acute intracranial abnormality. Minimal chronic small vessel ischemic disease. He has tremor in both his hands. He has a positive family history of Parkinson's disease in his grandfather. He is complaining of constant dizziness and unsteadiness which are worse with activity. Patient states that he frequently loses his balance and his wife has to steady him.    Currently in Pain? Yes    Pain Score 2     Pain Location --   migraine HA            [facility] PT Assessment - 09/01/21 0001       Ambulation/Gait   Ambulation/Gait Yes    Ambulation Distance (Feet) 1000 Feet    Assistive device --   walking stick   Gait Pattern --   two point gait with walking stick paired with RLE; Rt AFO, Rt knee brace (neoprene   Ambulation Surface Level    Gait velocity 0.11ms      Timed Up and Go Test   Normal TUG (seconds) 17.6                INTERVENTION THIS DATE:  6MWT 10067f TUG: 17sec 1 LOB  Seated cable Hamstrings Left 1x12 @ 17.5;  Right 1x12 @ 7.5lb   Seated hip extension to 90 degrees: Lt 37.5lb 1x10 Rt: 17.5lb 1x10   -Seated Chair + Airex + dynadisc downward row:  37.5lb, pulley at 18  -Seated trunk rotation with orange ball 8x bilat *PROVOKES dizziness, nausea, migraine  -Standing RDL with 11lb ball to stool x7;  First 4 reps are squat technique, excessive knee flexion, wife/author give cue for bending at waist which is effective for 2 reps, then patient nearly falls forward  andrequires MaxA to return to upright.        PT Education - 09/01/21 1706     Education Details safety and self monitoring with activities that provoke symptoms    Person(s) Educated Patient    Methods Explanation    Comprehension Verbalized understanding;Need further instruction;Verbal cues required              PT Short Term Goals - 08/18/21 1636       PT SHORT TERM GOAL #1   Title Patient will be independent in home exercise program to improve strength/mobility for better functional independence with ADLs and for self-management.    Baseline 3/3 HEP compliant 3/31 hep compliant    Time 6    Period Weeks    Status Achieved    Target Date 02/20/21      PT SHORT TERM GOAL #2   Title Patient will be modified independent in walking on even/uneven surface with least restrictive assistive device, for 10+ minutes without rest break, reporting some difficulty or less to improve walking tolerance with community ambulation including grocery shopping, going to church,etc.    Baseline 02/03: instability, LOB multiple times while ambulating to elevator 3/31: unable to test due to throwing up 6/20: 6 minutes 7/12: unable to test due to migraine 9/12: defer to next session    Time 6    Period Weeks    Status Partially Met    Target Date 09/29/21      PT SHORT TERM GOAL #3   Title Patient will increase ABC scale score  >80% to demonstrate better functional mobility and better confidence with ADLs.    Baseline 01/09/21: 60.625% 3/3/: 50% 3/31: 63% 6/20: 45% 7/25: 40% 9/12: 51.1%    Time 6    Period Weeks    Status Partially Met    Target Date 09/29/21               PT Long Term Goals - 08/18/21 1636       PT LONG TERM GOAL #1   Title Patient will increase FOTO score to equal to or greater than 65 to demonstrate statistically significant improvement in mobility and quality of life.    Baseline scored 41/100 on 05/21/2020; 7/22 40, 8/23: 60/100, 09/30/20=40, 01/09/21: 46 3/3: 40.3% 3/31: 49% 6/20: 45% 7/12: 50.7% 9/12: 44.7%    Time 12    Period Weeks    Status Partially Met    Target Date 11/10/21      PT LONG TERM GOAL #2   Title Patient will reduce falls risk as indicated by decreased TUG time to less than 11 seconds.    Baseline scored 37.9 sec with TUG on 05/21/2020; 21seconds with elevated rollator 7/22, 8/23: 13.62 sec with up and go walker, 01/09/21: 15.81s with hiking stick 3/3: 17.98 seconds one near LOB 3/31: 15.57 seconds with walking stick 6/20: 14.8 seconds with walking stick. 7/25: 19.72 seconds with walking stick.    Time 12    Period Weeks    Status Partially Met    Target Date 11/10/21      PT LONG TERM GOAL #3   Title Patient will increase right UE and LE gross strength to 4+/5 throughout to improve functional strength for independent gait, increased standing tolerance and increased ADL ability.    Baseline grossly +3/5 to -4/5 right UE and LE strength on 05/21/2020; grossly 4-/5 for RUE, grossly 4-/5 for RLE on 7/22, grossly 4-/5 RUE, grossly 4/5 RLE 3/3: see note  Time 12    Period Weeks    Status Partially Met    Target Date 11/10/21      PT LONG TERM GOAL #4   Title Patient will increase 10 meter walk test to >1.84ms as to improve gait speed for better community ambulation and to reduce fall risk.    Baseline on 7/22 .57 m/sself selected, fast  .73 m/s with LOB pt  reported R knee buckling, elevated rollator used, 8/23: 1.06 m/s with up and go walker, 01/09/21: 0.72 m/s with hiking stick 3/3: 0.74 m/s with walking stick 3/31: 0.81 m/s w walking stick 6/20: 1.1 m/s with walking stick    Time 12    Period Weeks    Status Achieved      PT LONG TERM GOAL #5   Title Patient will increase Berg Balance score by > 6 points to demonstrate decreased fall risk during functional activities.    Baseline 8/23: 37/56, 09/30/20=40/56, 12/20: 42/56 3/3: deferred 3/31 will perform next time; deferred due to throwing up 6/20: 46/56 7/12 38/56    Time 8    Period Weeks    Status Achieved      PT LONG TERM GOAL #6   Title Patient (< 662years old) will complete five times sit to stand test in < 10 seconds indicating an increased LE strength and improved balance.    Baseline 8/23: 22 sec, 09/30/20=13.04 sec, 12/20: 27.05 sec, 01/09/21: 18.01s 3/3: 23 seconds no UE support 3/31; deferred due to patient throwing up 7/12: 15.2 seconds    Time 12    Period Weeks    Status Partially Met    Target Date 11/10/21      PT LONG TERM GOAL #7   Title Patient will improve 6 min walk test >1000 feet with LRAD for improved gait ability in community.    Baseline 8/23: not tested; 8/30 845, goal adjusted >20043f 09/30/20=600 feet - stopped test due to R ankle catching making gait unsafe, 11/25/20= 330 feet - stopped test due to severe dizziness, vomiting/nausea, sweating making gait unsafe; 01/09/21: 720 ft with no rest breaks 3/3: deferred due to nausea 3/31: deferred due to patient throwing up 6/20: 640 ft 7/12: 66045fith walking stick, no rest breaks;    Time 12    Period Weeks    Status Partially Met    Target Date 11/10/21      PT LONG TERM GOAL #8   Title Patient will increase Berg Balance score by > 6 points  (52/56) to demonstrate decreased fall risk during functional activities.    Baseline 6/20: 46/56 7/12: 38/56 with migraine 9/12: defer to next session    Time 12     Period Weeks    Status On-going    Target Date 11/10/21                   Plan - 09/01/21 1708     Clinical Impression Statement Pt much better this date, less dizzy. Pt agreeable to 6MWT and TUG this date, will defer BBT and 5xSTS to next date. Pt has less Rt hemi related imbalance, in attention, unsteadiness this date, but does continue to struggle with turns in general which are very provocative to unsteadiness. Pt has 2-3 large amplitude loss of postural control requiring modA support to prevent fall once. Symptoms provoked and worsened with trunk/head rotation in chair as well as standing RDL. Pt able to collect himself seated after synmptoms provocation and AMB out  of clinic with out severe imbalance. Wife present throughout session.    Personal Factors and Comorbidities Comorbidity 3+;Time since onset of injury/illness/exacerbation    Comorbidities anxiety, back pain, COPD, headache, HTN, MI, sleep apnea, migraines    Examination-Activity Limitations Locomotion Level;Transfers;Bed Mobility;Stand;Stairs;Sleep;Squat;Bend    Examination-Participation Restrictions Driving;Medication Management    Stability/Clinical Decision Making Unstable/Unpredictable    Clinical Decision Making High    Rehab Potential Fair    PT Duration 12 weeks    PT Treatment/Interventions ADLs/Self Care Home Management;Canalith Repostioning;Moist Heat;Electrical Stimulation;Gait training;Stair training;Functional mobility training;Therapeutic activities;Therapeutic exercise;Balance training;Neuromuscular re-education;Patient/family education;Manual techniques;Passive range of motion;Vestibular    PT Next Visit Plan Continue with functional mobility training and strengthening;    PT Home Exercise Plan Medbridge Access Code: [email or web address]    Consulted and Agree with Plan of Care Patient;Family member/caregiver    Family Member Consulted Wife             Patient will benefit from skilled therapeutic  intervention in order to improve the following deficits and impairments:  Decreased activity tolerance, Decreased balance, Decreased cognition, Decreased mobility, Difficulty walking, Abnormal gait, Decreased range of motion, Dizziness, Pain, Impaired flexibility, Decreased strength, Impaired sensation, Decreased endurance, Decreased safety awareness  Visit Diagnosis: Muscle weakness (generalized)  Unsteadiness on feet  Dizziness and giddiness     Problem List Patient Active Problem List   Diagnosis Date Noted   Degenerative spondylolisthesis 03/25/2020   Physical deconditioning 03/04/2020   Lobar pneumonia, unspecified organism ([place]) 03/04/2020   Intractable hemiplegic migraine with status migrainosus 03/02/2020   Current tobacco use 02/05/2020   Abnormal findings on diagnostic imaging of lung 02/05/2020   Shortness of breath 02/05/2020   Herniated disc, cervical 01/23/2020   Cervical spondylosis with myelopathy and radiculopathy 01/23/2020   Pre-operative respiratory examination 01/12/2020   Weakness on right side of face 12/22/2019   Osteoarthritis of spine with radiculopathy, lumbar region 12/11/2019   Intractable migraine with aura with status migrainosus 11/09/2019   Former smoker 08/27/2018   Benign essential HTN 08/27/2018   Cough productive of purulent sputum 11/08/2017   GERD (gastroesophageal reflux disease) 05/06/2017   Contusion of hand 04/22/2017   BP (high blood pressure) 04/22/2017   Oxygen desaturation 04/22/2017   Prostate lump 04/22/2017   Unstable angina ([place]) 04/27/2016   Chest pain 04/24/2016   Morbid obesity ([place]) 01/29/2016   Elevated ALT measurement 10/30/2015   Nonspecific elevation of levels of transaminase and lactic acid dehydrogenase (ldh) 10/30/2015   Reduced libido 10/29/2015   Hyperlipidemia 08/06/2015   Anxiety 08/06/2015   Atherosclerosis of coronary artery 08/06/2015   Childhood asthma 08/06/2015   Chronic obstructive pulmonary disease  ([place]) 08/06/2015   OSA (obstructive sleep apnea) 08/06/2015   Anxiety disorder 08/06/2015   Atherosclerosis of native coronary artery of native heart with stable angina pectoris ([place]) 41/44/3601   Uncomplicated asthma 65/80/0634   5:24 PM, 09/01/21 Etta Grandchild, PT, DPT Physical Therapist - [facility]  [phone])    [place], PT 09/01/2021, 5:24 PM  [place] MAIN [facility] [place] SERVICES [address] [place], [place], [address] Phone: [phone]   Fax:  [phone]  Name: Cory Espinoza MRN: [id number] Date of Birth: [birth year]

## 2021-09-04 ENCOUNTER — Ambulatory Visit: Payer: BC Managed Care – PPO

## 2021-09-08 ENCOUNTER — Ambulatory Visit: Payer: BC Managed Care – PPO | Attending: Neurology

## 2021-09-08 ENCOUNTER — Other Ambulatory Visit: Payer: Self-pay

## 2021-09-08 DIAGNOSIS — R262 Difficulty in walking, not elsewhere classified: Secondary | ICD-10-CM | POA: Diagnosis present

## 2021-09-08 DIAGNOSIS — R42 Dizziness and giddiness: Secondary | ICD-10-CM | POA: Diagnosis present

## 2021-09-08 DIAGNOSIS — M6281 Muscle weakness (generalized): Secondary | ICD-10-CM | POA: Insufficient documentation

## 2021-09-08 DIAGNOSIS — R2681 Unsteadiness on feet: Secondary | ICD-10-CM | POA: Insufficient documentation

## 2021-09-08 NOTE — Therapy (Signed)
[place] MAIN [facility] SERVICES [address] [place], [place], [address] Phone: [phone]   Fax:  [phone]  Physical Therapy Treatment / Physical Therapy Progress Note   Dates of reporting period  06/17/21   to   09/08/21   Patient Details  Name: Cory Espinoza MRN: [id number] Date of Birth: October 27, 1971 Referring Provider (PT): Dr. Joselyn Arrow   Encounter Date: 09/08/2021   PT End of Session - 09/09/21 0718     Visit Number 60    Number of Visits 23    Date for PT Re-Evaluation 11/10/21    Authorization Type [employer] PPO;    Authorization Time Period next session 1/10 PN 10/3    PT Start Time 1515    PT Stop Time 1600    PT Time Calculation (min) 45 min    Equipment Utilized During Treatment Gait belt    Activity Tolerance Patient tolerated treatment well;Treatment limited secondary to medical complications (Comment)   provoked symptoms with certain activities   Behavior During Therapy WFL for tasks assessed/performed             Past Medical History:  Diagnosis Date   Allergy    Anginal pain (HCC)    Anxiety    Arthritis    lumbar spine   Asthma    Atrial fibrillation (HCC)    Back pain    Severe Lumbar Pain   CHF (congestive heart failure) (HCC)    COPD (chronic obstructive pulmonary disease) (HCC)    Restrictive lung disease   Coronary artery disease    Dyspnea    Dysrhythmia    afib   GERD (gastroesophageal reflux disease)    Headache    [name], onset October 2020, cluster headaches in the past   History of kidney stones    Hyperlipidemia    Hypertension    Myocardial infarction ([place])    at age 71   Pneumonia    Restrictive lung disease    Sleep apnea    unable to use cpap since onset of migraines    Past Surgical History:  Procedure Laterality Date   ABDOMINAL EXPOSURE N/A 03/25/2020   Procedure: ABDOMINAL EXPOSURE;  Surgeon: Rosetta Posner, MD;  Location: [facility] OR;  Service: Vascular;  Laterality: N/A;    ANTERIOR CERVICAL DECOMP/DISCECTOMY FUSION N/A 01/23/2020   Procedure: ANTERIOR CERVICAL DECOMPRESSION FUSION - CERVICAL SIX-CERVICAL SEVEN;  Surgeon: Earnie Larsson, MD;  Location: [place];  Service: Neurosurgery;  Laterality: N/A;   ANTERIOR LUMBAR FUSION N/A 03/25/2020   Procedure: ANTERIOR LUMBAR INTERBODY FUSION LUMBAR FIVE-SACRAL ONE.;  Surgeon: Earnie Larsson, MD;  Location: [place];  Service: Neurosurgery;  Laterality: N/A;  anterior   BACK SURGERY  1996   CARDIAC CATHETERIZATION Left 04/27/2016   Procedure: Left Heart Cath and Coronary Angiography;  Surgeon: Corey Skains, MD;  Location: [place];  Service: Cardiovascular;  Laterality: Left;   CARDIAC CATHETERIZATION N/A 04/27/2016   Procedure: Intravascular Pressure Wire/FFR Study;  Surgeon: Yolonda Kida, MD;  Location: [place] [facility];  Service: Cardiovascular;  Laterality: N/A;   CATHETERIZATION OF PULMONARY ARTERY WITH RETRIEVAL OF FOREIGN BODY Bilateral 04/07/2011   heart   DEGENERATIVE SPONDYLOLISTHESIS     KNEE ARTHROSCOPY Bilateral 2004   LUMBAR DISC SURGERY  1999   RADICULOPATHY, CERVICAL REGION     TONSILLECTOMY      There were no vitals filed for this visit.   Subjective Assessment - 09/09/21 0715  Subjective Patient reports with wife.  Patient's wife reports patient fell yesterday in the shower and has had back and neck pain since.  Has been compliant with with HEP when able however this weekend was difficult due to the weather requiring patient to be in bed rest for majority of weekend.    Pertinent History Pt has a complex medical history. 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 He states that he fell 10-12 feet and struck his head, neck, and back. He was able to finish the course which took him approximately 45 minutes more to complete. He does endorse a second fall while finishing the course but did not suffer a second head injury. He states that he started  getting headaches that day which have persisted since that time. 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 He does report improvement in his headaches recently. Neurology was concerned about possible BPPV and have referred him for a vestibular evaluation. In addition since the injury, pt developed RUE and RLE pain and weakness. Pt states that NCV showed abnormal nerve conduction in RUE. He has since undergone a C7-7 ACDF on 01/23/20. He reports initial improvement in RUE strength, but states that he has a gradual return of his RUE weakness. He was also having significant RLE weakness and pain and underwent and L5-S1 anterior lumbar interbody fusion on 03/25/20. Patient reports that he does not have any back precautions but needs to wear the low back brace for one more month. Patient reports he has a follow-up appointment with the surgeon next month. Patient reports he has a follow-up appointment with Dr. Brigitte Pulse, neurologist, in August. He arrives to therapy ambulating with an upright rollator walker. Pt denies any mention of concussion after his injury. He has been having cognitive issues since his injury and has previously had a heavy metals screen as well as neurocognitive testing. He has had an MRI of his brain with and without contrast on 12/18/19. Results were mildly motion degraded examination. No evidence of acute intracranial abnormality. Minimal chronic small vessel ischemic disease. He has tremor in both his hands. He has a positive family history of Parkinson's disease in his grandfather. He is complaining of constant dizziness and unsteadiness which are worse with activity. Patient states that he frequently loses his balance and his wife has to steady him.    Currently in Pain? Yes    Pain Score 7     Pain Location Head    Pain Descriptors / Indicators Aching;Pounding    Pain Type Chronic pain;Neuropathic pain    Pain Radiating  Towards down neck    Multiple Pain Sites Yes    Pain Score 8    Pain Location Back    Pain Orientation Upper;Mid;Lower    Pain Descriptors / Indicators Aching    Pain Type Chronic pain;Acute pain    Pain Onset Yesterday               Manual: Grade II mobilizations CPA and UPA to upper thoracic and lower cervical spine x 5 minutes J mobilization to upper thoracic for decreased curvature and pain control 20 seconds x 3 trials   therAct Seated to prone: min A for LE's  Prone to seated transition: education on log rolling technique requires mod A/max A due to dizziness and nausea. Required prolonged sitting position after transition with patient requiring use  of emesis bag and stabilization.  Ambulate 500 ft with walking stick and close CGA. Three rest breaks required agaisnt wall due to increased migraine. Decreased need for assistance compared to previous sessions   Trigger Point Dry Needling (TDN), unbilled Education performed with patient regarding potential benefit of TDN. Reviewed precautions and risks with patient. Reviewed special precautions/risks over lung fields which include pneumothorax. Reviewed signs and symptoms of pneumothorax and advised pt to go to ER immediately if these symptoms develop advise them of dry needling treatment. Extensive time spent with pt to ensure full understanding of TDN risks. Pt provided verbal consent to treatment. TDN performed to  with 0.25 x 40 single needle placements with local twitch response (LTR). Pistoning technique utilized. Improved pain-free motion following intervention. Musculature focused on: lumbar paraspinals R and L, bilateral upper traps, and suboccipitals bilaterally. X 16 minutes in prone position     Patient's condition has the potential to improve in response to therapy. Maximum improvement is yet to be obtained. The anticipated improvement is attainable and reasonable in a generally predictable time.  Patient reports he  is improving but has good days and bad days.       Patient's goals performed 09/01/2021 please refer to this note for further details.  Patient tolerated TDN well this session as well as manual therapy.  Patient requires education and assistance with transition from prone to seated position with education on logrolling technique for reduction of low back pain.  Patient experienced 3 episodes during ambulation back to car with PT assistance that required standing rest breaks against wall to reduce risk of patient losing balance.Patient's condition has the potential to improve in response to therapy. Maximum improvement is yet to be obtained. The anticipated improvement is attainable and reasonable in a generally predictable time. patient back upstairs to car. The patient continues to benefit from additional skilled PT services to improve LE strength and balance for decreased fall risk and improved quality of life                      PT Education - 09/09/21 0716     Education Details TDN, manual    Person(s) Educated Patient    Methods Explanation;Demonstration;Tactile cues;Verbal cues    Comprehension Verbalized understanding;Returned demonstration;Verbal cues required;Tactile cues required              PT Short Term Goals - 08/18/21 1636       PT SHORT TERM GOAL #1   Title Patient will be independent in home exercise program to improve strength/mobility for better functional independence with ADLs and for self-management.    Baseline 3/3 HEP compliant 3/31 hep compliant    Time 6    Period Weeks    Status Achieved    Target Date 02/20/21      PT SHORT TERM GOAL #2   Title Patient will be modified independent in walking on even/uneven surface with least restrictive assistive device, for 10+ minutes without rest break, reporting some difficulty or less to improve walking tolerance with community ambulation including grocery shopping, going to church,etc.    Baseline  02/03: instability, LOB multiple times while ambulating to elevator 3/31: unable to test due to throwing up 6/20: 6 minutes 7/12: unable to test due to migraine 9/12: defer to next session    Time 6    Period Weeks    Status Partially Met    Target Date 09/29/21      PT SHORT  TERM GOAL #3   Title Patient will increase ABC scale score >80% to demonstrate better functional mobility and better confidence with ADLs.    Baseline 01/09/21: 60.625% 3/3/: 50% 3/31: 63% 6/20: 45% 7/25: 40% 9/12: 51.1%    Time 6    Period Weeks    Status Partially Met    Target Date 09/29/21               PT Long Term Goals - 09/09/21 0723       PT LONG TERM GOAL #1   Title Patient will increase FOTO score to equal to or greater than 65 to demonstrate statistically significant improvement in mobility and quality of life.    Baseline scored 41/100 on 05/21/2020; 7/22 40, 8/23: 60/100, 09/30/20=40, 01/09/21: 46 3/3: 40.3% 3/31: 49% 6/20: 45% 7/12: 50.7% 9/12: 44.7%    Time 12    Period Weeks    Status Partially Met    Target Date 11/10/21      PT LONG TERM GOAL #2   Title Patient will reduce falls risk as indicated by decreased TUG time to less than 11 seconds.    Baseline scored 37.9 sec with TUG on 05/21/2020; 21seconds with elevated rollator 7/22, 8/23: 13.62 sec with up and go walker, 01/09/21: 15.81s with hiking stick 3/3: 17.98 seconds one near LOB 3/31: 15.57 seconds with walking stick 6/20: 14.8 seconds with walking stick. 7/25: 19.72 seconds with walking stick. 9/26: 17.6 seconds    Time 12    Period Weeks    Status Partially Met    Target Date 11/10/21      PT LONG TERM GOAL #3   Title Patient will increase right UE and LE gross strength to 4+/5 throughout to improve functional strength for independent gait, increased standing tolerance and increased ADL ability.    Baseline grossly +3/5 to -4/5 right UE and LE strength on 05/21/2020; grossly 4-/5 for RUE, grossly 4-/5 for RLE on 7/22, grossly  4-/5 RUE, grossly 4/5 RLE 3/3: see note    Time 12    Period Weeks    Status Partially Met    Target Date 11/10/21      PT LONG TERM GOAL #4   Title Patient will increase 10 meter walk test to >1.46ms as to improve gait speed for better community ambulation and to reduce fall risk.    Baseline on 7/22 .57 m/sself selected, fast  .73 m/s with LOB pt reported R knee buckling, elevated rollator used, 8/23: 1.06 m/s with up and go walker, 01/09/21: 0.72 m/s with hiking stick 3/3: 0.74 m/s with walking stick 3/31: 0.81 m/s w walking stick 6/20: 1.1 m/s with walking stick    Time 12    Period Weeks    Status Achieved      PT LONG TERM GOAL #5   Title Patient will increase Berg Balance score by > 6 points to demonstrate decreased fall risk during functional activities.    Baseline 8/23: 37/56, 09/30/20=40/56, 12/20: 42/56 3/3: deferred 3/31 will perform next time; deferred due to throwing up 6/20: 46/56 7/12 38/56    Time 8    Period Weeks    Status Achieved      PT LONG TERM GOAL #6   Title Patient (< 666years old) will complete five times sit to stand test in < 10 seconds indicating an increased LE strength and improved balance.    Baseline 8/23: 22 sec, 09/30/20=13.04 sec, 12/20: 27.05 sec, 01/09/21: 18.01s 3/3: 23  seconds no UE support 3/31; deferred due to patient throwing up 7/12: 15.2 seconds    Time 12    Period Weeks    Status Partially Met    Target Date 11/10/21      PT LONG TERM GOAL #7   Title Patient will improve 6 min walk test >1000 feet with LRAD for improved gait ability in community.    Baseline 8/23: not tested; 8/30 845, goal adjusted >2028ft, 09/30/20=600 feet - stopped test due to R ankle catching making gait unsafe, 11/25/20= 330 feet - stopped test due to severe dizziness, vomiting/nausea, sweating making gait unsafe; 01/09/21: 720 ft with no rest breaks 3/3: deferred due to nausea 3/31: deferred due to patient throwing up 6/20: 640 ft 7/12: 651ft with walking stick,  no rest breaks; 9/26: 1000 ft    Time 12    Period Weeks    Status Achieved      PT LONG TERM GOAL #8   Title Patient will increase Berg Balance score by > 6 points  (52/56) to demonstrate decreased fall risk during functional activities.    Baseline 6/20: 46/56 7/12: 38/56 with migraine 9/12: defer to next session    Time 12    Period Weeks    Status On-going    Target Date 11/10/21                   Plan - 09/09/21 0722     Clinical Impression Statement Patient's goals performed 09/01/2021 please refer to this note for further details.  Patient tolerated TDN well this session as well as manual therapy.  Patient requires education and assistance with transition from prone to seated position with education on logrolling technique for reduction of low back pain.  Patient experienced 3 episodes during ambulation back to car with PT assistance that required standing rest breaks against wall to reduce risk of patient losing balance.Patient's condition has the potential to improve in response to therapy. Maximum improvement is yet to be obtained. The anticipated improvement is attainable and reasonable in a generally predictable time. patient back upstairs to car. The patient continues to benefit from additional skilled PT services to improve LE strength and balance for decreased fall risk and improved quality of life    Personal Factors and Comorbidities Comorbidity 3+;Time since onset of injury/illness/exacerbation    Comorbidities anxiety, back pain, COPD, headache, HTN, MI, sleep apnea, migraines    Examination-Activity Limitations Locomotion Level;Transfers;Bed Mobility;Stand;Stairs;Sleep;Squat;Bend    Examination-Participation Restrictions Driving;Medication Management    Stability/Clinical Decision Making Unstable/Unpredictable    Rehab Potential Fair    PT Duration 12 weeks    PT Treatment/Interventions ADLs/Self Care Home Management;Canalith Repostioning;Moist Heat;Electrical  Stimulation;Gait training;Stair training;Functional mobility training;Therapeutic activities;Therapeutic exercise;Balance training;Neuromuscular re-education;Patient/family education;Manual techniques;Passive range of motion;Vestibular    PT Next Visit Plan Continue with functional mobility training and strengthening;    PT Home Exercise Plan Medbridge Access Code: [email or web address]    Consulted and Agree with Plan of Care Patient;Family member/caregiver    Family Member Consulted Wife             Patient will benefit from skilled therapeutic intervention in order to improve the following deficits and impairments:  Decreased activity tolerance, Decreased balance, Decreased cognition, Decreased mobility, Difficulty walking, Abnormal gait, Decreased range of motion, Dizziness, Pain, Impaired flexibility, Decreased strength, Impaired sensation, Decreased endurance, Decreased safety awareness  Visit Diagnosis: Muscle weakness (generalized)  Unsteadiness on feet  Dizziness and giddiness     Problem List Patient Active  Problem List   Diagnosis Date Noted   Degenerative spondylolisthesis 03/25/2020   Physical deconditioning 03/04/2020   Lobar pneumonia, unspecified organism ([place]) 03/04/2020   Intractable hemiplegic migraine with status migrainosus 03/02/2020   Current tobacco use 02/05/2020   Abnormal findings on diagnostic imaging of lung 02/05/2020   Shortness of breath 02/05/2020   Herniated disc, cervical 01/23/2020   Cervical spondylosis with myelopathy and radiculopathy 01/23/2020   Pre-operative respiratory examination 01/12/2020   Weakness on right side of face 12/22/2019   Osteoarthritis of spine with radiculopathy, lumbar region 12/11/2019   Intractable migraine with aura with status migrainosus 11/09/2019   Former smoker 08/27/2018   Benign essential HTN 08/27/2018   Cough productive of purulent sputum 11/08/2017   GERD (gastroesophageal reflux disease) 05/06/2017   Contusion  of hand 04/22/2017   BP (high blood pressure) 04/22/2017   Oxygen desaturation 04/22/2017   Prostate lump 04/22/2017   Unstable angina ([place]) 04/27/2016   Chest pain 04/24/2016   Morbid obesity ([place]) 01/29/2016   Elevated ALT measurement 10/30/2015   Nonspecific elevation of levels of transaminase and lactic acid dehydrogenase (ldh) 10/30/2015   Reduced libido 10/29/2015   Hyperlipidemia 08/06/2015   Anxiety 08/06/2015   Atherosclerosis of coronary artery 08/06/2015   Childhood asthma 08/06/2015   Chronic obstructive pulmonary disease ([place]) 08/06/2015   OSA (obstructive sleep apnea) 08/06/2015   Anxiety disorder 08/06/2015   Atherosclerosis of native coronary artery of native heart with stable angina pectoris ([place]) 68/40/3353   Uncomplicated asthma 31/74/0992    Janna Arch, PT, DPT  09/09/2021, 7:25 AM  [place] MAIN [facility] SERVICES [address] [place], [place], [address] Phone: [phone]   Fax:  [phone]  Name: Cory Espinoza MRN: [id number] Date of Birth: [birth year]

## 2021-09-11 ENCOUNTER — Other Ambulatory Visit: Payer: Self-pay

## 2021-09-11 ENCOUNTER — Ambulatory Visit: Payer: BC Managed Care – PPO

## 2021-09-11 DIAGNOSIS — M6281 Muscle weakness (generalized): Secondary | ICD-10-CM

## 2021-09-11 DIAGNOSIS — R42 Dizziness and giddiness: Secondary | ICD-10-CM

## 2021-09-11 DIAGNOSIS — R2681 Unsteadiness on feet: Secondary | ICD-10-CM

## 2021-09-11 NOTE — Therapy (Addendum)
[place] MAIN [facility] SERVICES [address] [place], [place], [address] Phone: [phone]   Fax:  [phone]  Physical Therapy Treatment  Patient Details  Name: Cory Espinoza MRN: [id number] Date of Birth: 01/04/1971 Referring Provider (PT): Dr. Joselyn Arrow   Encounter Date: 09/11/2021   PT End of Session - 09/11/21 1507     Visit Number 17    Number of Visits 35    Date for PT Re-Evaluation 11/10/21    Authorization Type BCBS PPO;    Authorization Time Period 1/10 PN 10/3    PT Start Time 1514    PT Stop Time 1600    PT Time Calculation (min) 46 min    Equipment Utilized During Treatment Gait belt    Activity Tolerance Patient tolerated treatment well;Treatment limited secondary to medical complications (Comment)   provoked symptoms with certain activities   Behavior During Therapy WFL for tasks assessed/performed             Past Medical History:  Diagnosis Date   Allergy    Anginal pain (HCC)    Anxiety    Arthritis    lumbar spine   Asthma    Atrial fibrillation (HCC)    Back pain    Severe Lumbar Pain   CHF (congestive heart failure) (HCC)    COPD (chronic obstructive pulmonary disease) (HCC)    Restrictive lung disease   Coronary artery disease    Dyspnea    Dysrhythmia    afib   GERD (gastroesophageal reflux disease)    Headache    [name], onset October 2020, cluster headaches in the past   History of kidney stones    Hyperlipidemia    Hypertension    Myocardial infarction ([place])    at age 17   Pneumonia    Restrictive lung disease    Sleep apnea    unable to use cpap since onset of migraines    Past Surgical History:  Procedure Laterality Date   ABDOMINAL EXPOSURE N/A 03/25/2020   Procedure: ABDOMINAL EXPOSURE;  Surgeon: Rosetta Posner, MD;  Location: [facility] OR;  Service: Vascular;  Laterality: N/A;   ANTERIOR CERVICAL DECOMP/DISCECTOMY FUSION N/A 01/23/2020   Procedure: ANTERIOR CERVICAL  DECOMPRESSION FUSION - CERVICAL SIX-CERVICAL SEVEN;  Surgeon: Earnie Larsson, MD;  Location: [place];  Service: Neurosurgery;  Laterality: N/A;   ANTERIOR LUMBAR FUSION N/A 03/25/2020   Procedure: ANTERIOR LUMBAR INTERBODY FUSION LUMBAR FIVE-SACRAL ONE.;  Surgeon: Earnie Larsson, MD;  Location: [place];  Service: Neurosurgery;  Laterality: N/A;  anterior   BACK SURGERY  1996   CARDIAC CATHETERIZATION Left 04/27/2016   Procedure: Left Heart Cath and Coronary Angiography;  Surgeon: Corey Skains, MD;  Location: [place] [facility];  Service: Cardiovascular;  Laterality: Left;   CARDIAC CATHETERIZATION N/A 04/27/2016   Procedure: Intravascular Pressure Wire/FFR Study;  Surgeon: Yolonda Kida, MD;  Location: [place] [facility];  Service: Cardiovascular;  Laterality: N/A;   CATHETERIZATION OF PULMONARY ARTERY WITH RETRIEVAL OF FOREIGN BODY Bilateral 04/07/2011   heart   DEGENERATIVE SPONDYLOLISTHESIS     KNEE ARTHROSCOPY Bilateral 2004   LUMBAR DISC SURGERY  1999   RADICULOPATHY, CERVICAL REGION     TONSILLECTOMY      There were no vitals filed for this visit.   Subjective Assessment - 09/11/21 1518     Subjective Patient fell earlier this week and had another LOB  since last session. Is feeling well today and eager  to progress strengthening. Able to go to [place] since last session.    Pertinent History Pt has a complex medical history. 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 He states that he fell 10-12 feet and struck his head, neck, and back. He was able to finish the course which took him approximately 45 minutes more to complete. He does endorse a second fall while finishing the course but did not suffer a second head injury. He states that he started getting headaches that day which have persisted since that time. 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 He does report improvement in  his headaches recently. Neurology was concerned about possible BPPV and have referred him for a vestibular evaluation. In addition since the injury, pt developed RUE and RLE pain and weakness. Pt states that NCV showed abnormal nerve conduction in RUE. He has since undergone a C7-7 ACDF on 01/23/20. He reports initial improvement in RUE strength, but states that he has a gradual return of his RUE weakness. He was also having significant RLE weakness and pain and underwent and L5-S1 anterior lumbar interbody fusion on 03/25/20. Patient reports that he does not have any back precautions but needs to wear the low back brace for one more month. Patient reports he has a follow-up appointment with the surgeon next month. Patient reports he has a follow-up appointment with Dr. Brigitte Pulse, neurologist, in August. He arrives to therapy ambulating with an upright rollator walker. Pt denies any mention of concussion after his injury. He has been having cognitive issues since his injury and has previously had a heavy metals screen as well as neurocognitive testing. He has had an MRI of his brain with and without contrast on 12/18/19. Results were mildly motion degraded examination. No evidence of acute intracranial abnormality. Minimal chronic small vessel ischemic disease. He has tremor in both his hands. He has a positive family history of Parkinson's disease in his grandfather. He is complaining of constant dizziness and unsteadiness which are worse with activity. Patient states that he frequently loses his balance and his wife has to steady him.    Limitations Lifting;Standing;Walking;House hold activities    Diagnostic tests He has had an MRI of his brain with and without contrast on 12/18/19. Results were mildly motion degraded examination. No evidence of acute intracranial abnormality. Minimal chronic small vessel ischemic disease.    Patient Stated Goals to be able to ambulate with a cane or no AD, to be able to drive, to be  able to ride his motorcycle    Currently in Pain? No/denies                   TherEx: Seated cable Hamstrings Left 15x with 17.5lb; 2 sets cues for sequencing Right 15x with 7.5lb; 2 sets with cues for sequencing.    seated: Cable face pull 32 lb 15x cues for pulling to nose  Cable tricep press down 15x; 22.5 lb second set 32.5 lb  adduction L 22.5 lb; 15x; 2 sets R 7.5 lb 15x; 2 sets   In dark room due to migraine  RTB abduction 15x RTB hamstring curl 15x  Ambulate 450 ft to elevator and upstairs to car. Car transfer with min A for RLE provided.   Pt educated throughout session about proper posture and technique with exercises. Improved exercise technique, movement at target joints, use of  target muscles after min to mod verbal, visual, tactile cues.   Patient able to tolerate strengthening interventions well due to decreased migraine this session. Utilization of advanced cable weight strengthening in seated position performed. Towards last 1/3 of session patient had increased migraine requiring dark room with lights turned off as well as assistance ambulating back to car and performing car transfer. The patient continues to benefit from additional skilled PT services to improve LE strength and balance for decreased fall risk and improved quality of life                 PT Education - 09/11/21 1506     Education Details exercise technique, body mechanics    Person(s) Educated Patient    Methods Explanation;Demonstration;Tactile cues;Verbal cues    Comprehension Verbalized understanding;Returned demonstration;Verbal cues required;Tactile cues required              PT Short Term Goals - 08/18/21 1636       PT SHORT TERM GOAL #1   Title Patient will be independent in home exercise program to improve strength/mobility for better functional independence with ADLs and for self-management.    Baseline 3/3 HEP compliant 3/31 hep compliant    Time 6     Period Weeks    Status Achieved    Target Date 02/20/21      PT SHORT TERM GOAL #2   Title Patient will be modified independent in walking on even/uneven surface with least restrictive assistive device, for 10+ minutes without rest break, reporting some difficulty or less to improve walking tolerance with community ambulation including grocery shopping, going to church,etc.    Baseline 02/03: instability, LOB multiple times while ambulating to elevator 3/31: unable to test due to throwing up 6/20: 6 minutes 7/12: unable to test due to migraine 9/12: defer to next session    Time 6    Period Weeks    Status Partially Met    Target Date 09/29/21      PT SHORT TERM GOAL #3   Title Patient will increase ABC scale score >80% to demonstrate better functional mobility and better confidence with ADLs.    Baseline 01/09/21: 60.625% 3/3/: 50% 3/31: 63% 6/20: 45% 7/25: 40% 9/12: 51.1%    Time 6    Period Weeks    Status Partially Met    Target Date 09/29/21               PT Long Term Goals - 09/09/21 0723       PT LONG TERM GOAL #1   Title Patient will increase FOTO score to equal to or greater than 65 to demonstrate statistically significant improvement in mobility and quality of life.    Baseline scored 41/100 on 05/21/2020; 7/22 40, 8/23: 60/100, 09/30/20=40, 01/09/21: 46 3/3: 40.3% 3/31: 49% 6/20: 45% 7/12: 50.7% 9/12: 44.7%    Time 12    Period Weeks    Status Partially Met    Target Date 11/10/21      PT LONG TERM GOAL #2   Title Patient will reduce falls risk as indicated by decreased TUG time to less than 11 seconds.    Baseline scored 37.9 sec with TUG on 05/21/2020; 21seconds with elevated rollator 7/22, 8/23: 13.62 sec with up and go walker, 01/09/21: 15.81s with hiking stick 3/3: 17.98 seconds one near LOB 3/31: 15.57 seconds with walking stick 6/20: 14.8 seconds with walking stick. 7/25: 19.72 seconds with walking stick. 9/26: 17.6 seconds  Time 12    Period Weeks     Status Partially Met    Target Date 11/10/21      PT LONG TERM GOAL #3   Title Patient will increase right UE and LE gross strength to 4+/5 throughout to improve functional strength for independent gait, increased standing tolerance and increased ADL ability.    Baseline grossly +3/5 to -4/5 right UE and LE strength on 05/21/2020; grossly 4-/5 for RUE, grossly 4-/5 for RLE on 7/22, grossly 4-/5 RUE, grossly 4/5 RLE 3/3: see note    Time 12    Period Weeks    Status Partially Met    Target Date 11/10/21      PT LONG TERM GOAL #4   Title Patient will increase 10 meter walk test to >1.87ms as to improve gait speed for better community ambulation and to reduce fall risk.    Baseline on 7/22 .57 m/sself selected, fast  .73 m/s with LOB pt reported R knee buckling, elevated rollator used, 8/23: 1.06 m/s with up and go walker, 01/09/21: 0.72 m/s with hiking stick 3/3: 0.74 m/s with walking stick 3/31: 0.81 m/s w walking stick 6/20: 1.1 m/s with walking stick    Time 12    Period Weeks    Status Achieved      PT LONG TERM GOAL #5   Title Patient will increase Berg Balance score by > 6 points to demonstrate decreased fall risk during functional activities.    Baseline 8/23: 37/56, 09/30/20=40/56, 12/20: 42/56 3/3: deferred 3/31 will perform next time; deferred due to throwing up 6/20: 46/56 7/12 38/56    Time 8    Period Weeks    Status Achieved      PT LONG TERM GOAL #6   Title Patient (< 651years old) will complete five times sit to stand test in < 10 seconds indicating an increased LE strength and improved balance.    Baseline 8/23: 22 sec, 09/30/20=13.04 sec, 12/20: 27.05 sec, 01/09/21: 18.01s 3/3: 23 seconds no UE support 3/31; deferred due to patient throwing up 7/12: 15.2 seconds    Time 12    Period Weeks    Status Partially Met    Target Date 11/10/21      PT LONG TERM GOAL #7   Title Patient will improve 6 min walk test >1000 feet with LRAD for improved gait ability in community.     Baseline 8/23: not tested; 8/30 845, goal adjusted >20045f 09/30/20=600 feet - stopped test due to R ankle catching making gait unsafe, 11/25/20= 330 feet - stopped test due to severe dizziness, vomiting/nausea, sweating making gait unsafe; 01/09/21: 720 ft with no rest breaks 3/3: deferred due to nausea 3/31: deferred due to patient throwing up 6/20: 640 ft 7/12: [id number] walking stick, no rest breaks; 9/26: 1000 ft    Time 12    Period Weeks    Status Achieved      PT LONG TERM GOAL #8   Title Patient will increase Berg Balance score by > 6 points  (52/56) to demonstrate decreased fall risk during functional activities.    Baseline 6/20: 46/56 7/12: 38/56 with migraine 9/12: defer to next session    Time 12    Period Weeks    Status On-going    Target Date 11/10/21                   Plan - 09/11/21 1601     Clinical Impression Statement Patient  able to tolerate strengthening interventions well due to decreased migraine this session. Utilization of advanced cable weight strengthening in seated position performed. Towards last 1/3 of session patient had increased migraine requiring dark room with lights turned off as well as assistance ambulating back to car and performing car transfer. The patient continues to benefit from additional skilled PT services to improve LE strength and balance for decreased fall risk and improved quality of life    Personal Factors and Comorbidities Comorbidity 3+;Time since onset of injury/illness/exacerbation    Comorbidities anxiety, back pain, COPD, headache, HTN, MI, sleep apnea, migraines    Examination-Activity Limitations Locomotion Level;Transfers;Bed Mobility;Stand;Stairs;Sleep;Squat;Bend    Examination-Participation Restrictions Driving;Medication Management    Stability/Clinical Decision Making Unstable/Unpredictable    Rehab Potential Fair    PT Duration 12 weeks    PT Treatment/Interventions ADLs/Self Care Home Management;[place]  Repostioning;Moist Heat;Electrical Stimulation;Gait training;Stair training;Functional mobility training;Therapeutic activities;Therapeutic exercise;Balance training;Neuromuscular re-education;Patient/family education;Manual techniques;Passive range of motion;Vestibular    PT Next Visit Plan Continue with functional mobility training and strengthening;    PT Home Exercise Plan Medbridge Access Code: [email or web address]    Consulted and Agree with Plan of Care Patient;Family member/caregiver    Family Member Consulted Wife             Patient will benefit from skilled therapeutic intervention in order to improve the following deficits and impairments:  Decreased activity tolerance, Decreased balance, Decreased cognition, Decreased mobility, Difficulty walking, Abnormal gait, Decreased range of motion, Dizziness, Pain, Impaired flexibility, Decreased strength, Impaired sensation, Decreased endurance, Decreased safety awareness  Visit Diagnosis: Muscle weakness (generalized)  Unsteadiness on feet  Dizziness and giddiness     Problem List Patient Active Problem List   Diagnosis Date Noted   Degenerative spondylolisthesis 03/25/2020   Physical deconditioning 03/04/2020   Lobar pneumonia, unspecified organism ([place]) 03/04/2020   Intractable hemiplegic migraine with status migrainosus 03/02/2020   Current tobacco use 02/05/2020   Abnormal findings on diagnostic imaging of lung 02/05/2020   Shortness of breath 02/05/2020   Herniated disc, cervical 01/23/2020   Cervical spondylosis with myelopathy and radiculopathy 01/23/2020   Pre-operative respiratory examination 01/12/2020   Weakness on right side of face 12/22/2019   Osteoarthritis of spine with radiculopathy, lumbar region 12/11/2019   Intractable migraine with aura with status migrainosus 11/09/2019   Former smoker 08/27/2018   Benign essential HTN 08/27/2018   Cough productive of purulent sputum 11/08/2017   GERD (gastroesophageal reflux  disease) 05/06/2017   Contusion of hand 04/22/2017   BP (high blood pressure) 04/22/2017   Oxygen desaturation 04/22/2017   Prostate lump 04/22/2017   Unstable angina ([place]) 04/27/2016   Chest pain 04/24/2016   Morbid obesity ([place]) 01/29/2016   Elevated ALT measurement 10/30/2015   Nonspecific elevation of levels of transaminase and lactic acid dehydrogenase (ldh) 10/30/2015   Reduced libido 10/29/2015   Hyperlipidemia 08/06/2015   Anxiety 08/06/2015   Atherosclerosis of coronary artery 08/06/2015   Childhood asthma 08/06/2015   Chronic obstructive pulmonary disease ([place]) 08/06/2015   OSA (obstructive sleep apnea) 08/06/2015   Anxiety disorder 08/06/2015   Atherosclerosis of native coronary artery of native heart with stable angina pectoris ([place]) 01/77/9390   Uncomplicated asthma 30/08/2329    Janna Arch, PT, DPT  09/11/2021, 4:02 PM  [place] MAIN [facility] SERVICES [address] [place], [place], [address] Phone: [phone]   Fax:  [phone]  Name: SOMTOCHUKWU WOOLLARD MRN: [id number] Date of Birth: 02/10/1971

## 2021-09-15 ENCOUNTER — Ambulatory Visit: Payer: BC Managed Care – PPO

## 2021-09-15 IMAGING — MR MR HEAD W/O CM
3 series · 48 of 48 positions shown · non-contrast
Comparison: None.

CLINICAL DATA: Intractable migraine

EXAM:
MRI HEAD WITHOUT CONTRAST
TECHNIQUE: Multiplanar, multiecho pulse sequences of the brain and surrounding
structures were obtained without intravenous contrast.

[Series 52: nqsegcb_sc_cor · 1.00mm/px · 18 of 238 slices shown]
[im 1/238]
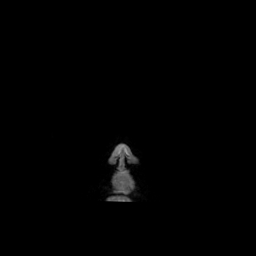
[im 14/238]
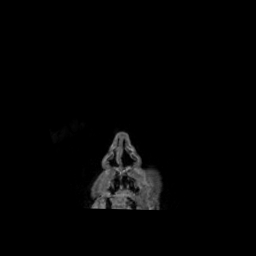
[im 28/238]
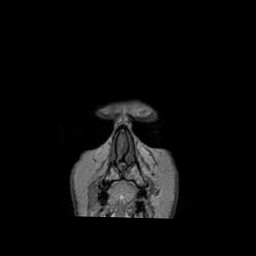
[im 42/238]
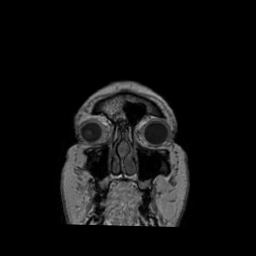
[im 56/238]
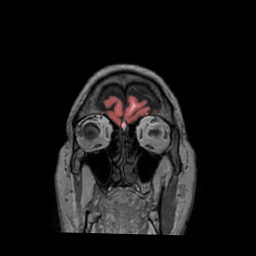
[im 70/238]
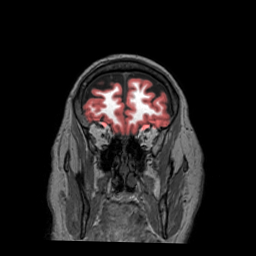
[im 84/238]
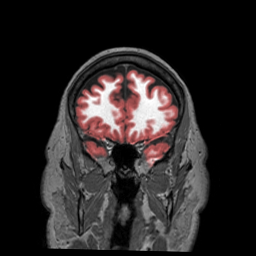
[im 98/238]
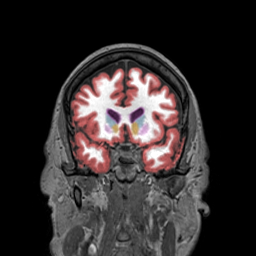
[im 112/238]
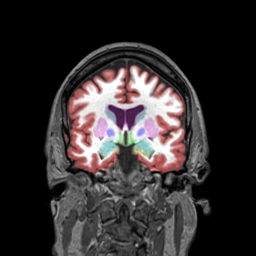
[im 126/238]
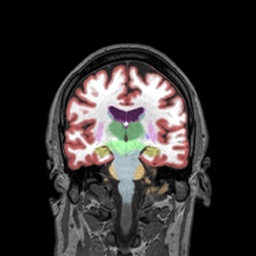
[im 140/238]
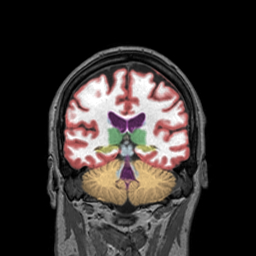
[im 154/238]
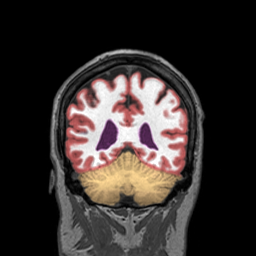
[im 168/238]
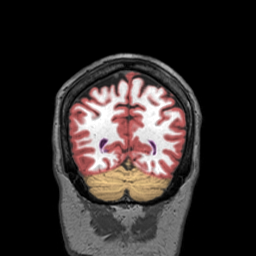
[im 182/238]
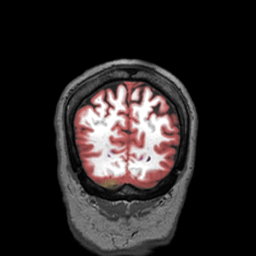
[im 196/238]
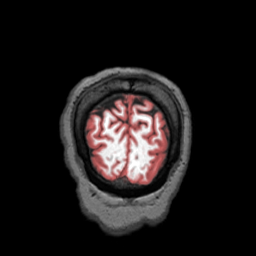
[im 210/238]
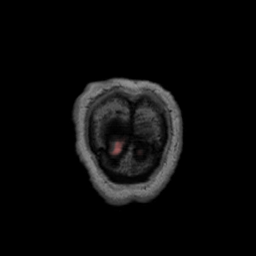
[im 224/238]
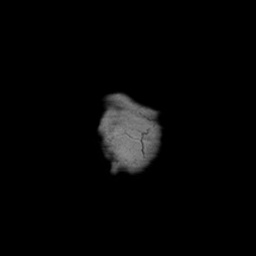
[im 238/238]
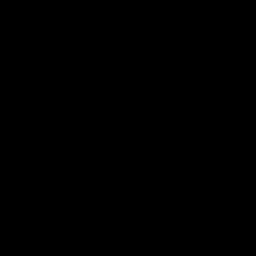

[Series 53: nqsegcb_sc_axl · 1.00mm/px · 16 of 220 slices shown]
[im 1/220]
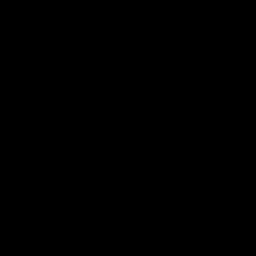
[im 15/220]
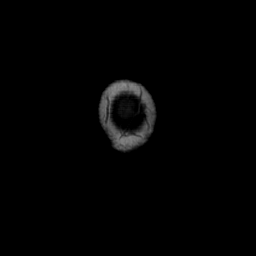
[im 30/220]
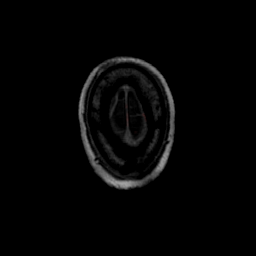
[im 44/220]
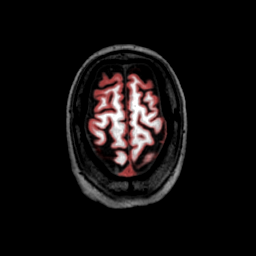
[im 59/220]
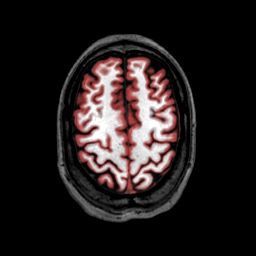
[im 74/220]
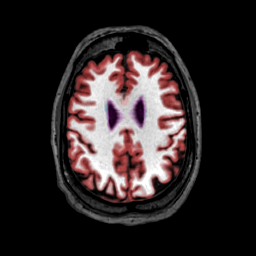
[im 88/220]
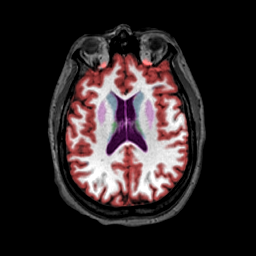
[im 103/220]
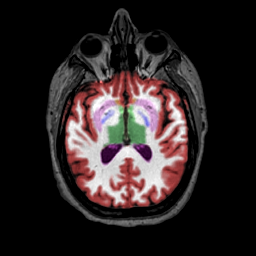
[im 117/220]
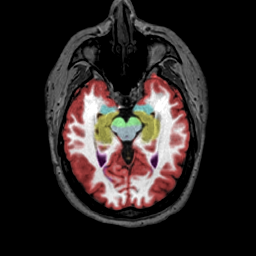
[im 132/220]
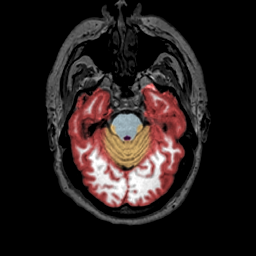
[im 147/220]
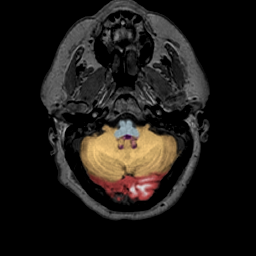
[im 161/220]
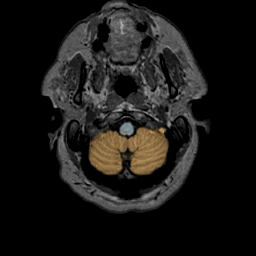
[im 176/220]
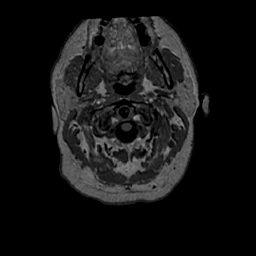
[im 190/220]
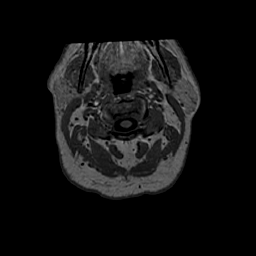
[im 205/220]
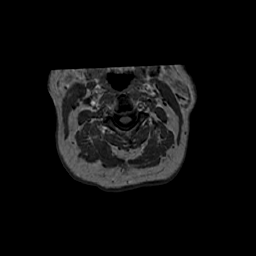
[im 220/220]
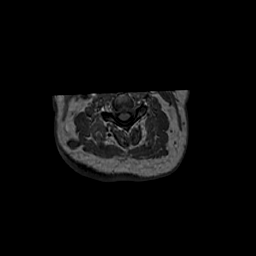

[Series 54: nqsegcb_sc_sag · 1.00mm/px · 14 of 192 slices shown]
[im 1/192]
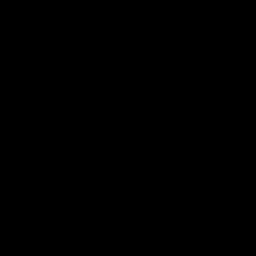
[im 15/192]
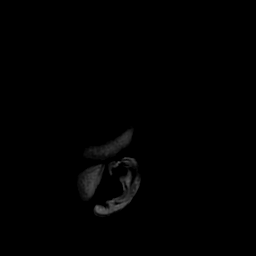
[im 30/192]
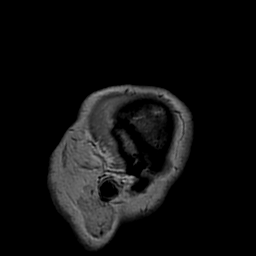
[im 45/192]
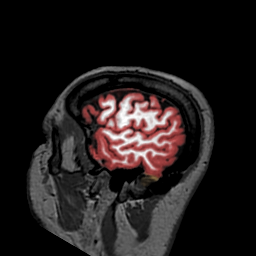
[im 59/192]
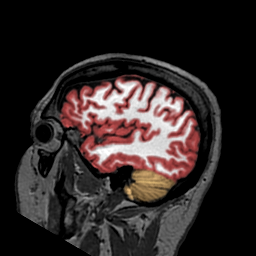
[im 74/192]
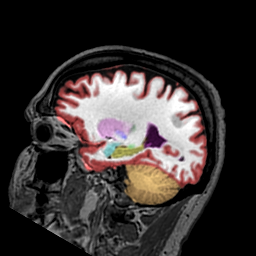
[im 89/192]
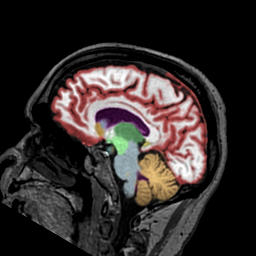
[im 103/192]
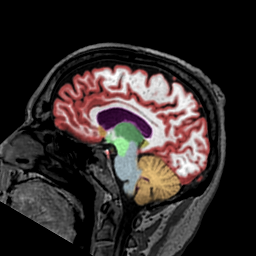
[im 118/192]
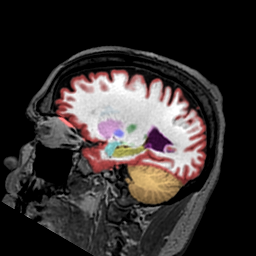
[im 133/192]
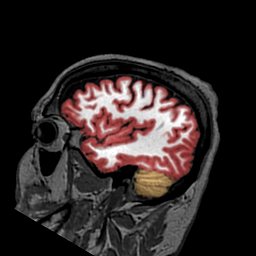
[im 147/192]
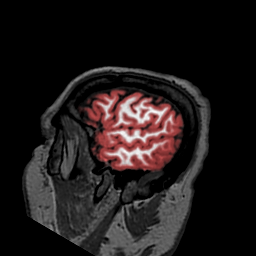
[im 162/192]
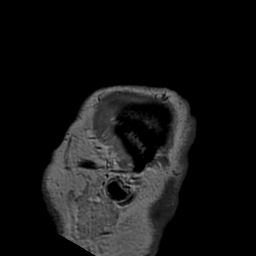
[im 177/192]
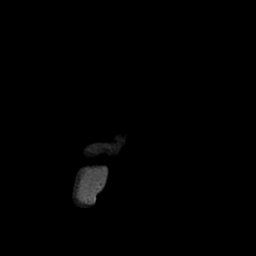
[im 192/192]
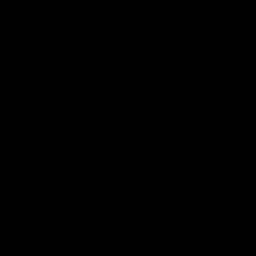

[48 of 48 positions shown; findings below may reference images not displayed]

FINDINGS: BRAIN: No acute infarct, acute hemorrhage or extra-axial collection.
Normal white matter signal for age. Normal volume of brain
parenchyma and CSF spaces. Midline structures are normal.

VASCULAR: Major flow voids are preserved. Susceptibility-sensitive
sequences show no chronic microhemorrhage or superficial siderosis.

SKULL AND UPPER CERVICAL SPINE: Normal calvarium and skull base.
Visualized upper cervical spine and soft tissues are normal.

SINUSES/ORBITS: No fluid levels or advanced mucosal thickening. No
mastoid or middle ear effusion. Normal orbits.
IMPRESSION: Normal brain MRI.

## 2021-09-18 ENCOUNTER — Ambulatory Visit: Payer: BC Managed Care – PPO

## 2021-09-22 ENCOUNTER — Ambulatory Visit: Payer: BC Managed Care – PPO

## 2021-09-22 ENCOUNTER — Other Ambulatory Visit: Payer: Self-pay

## 2021-09-22 DIAGNOSIS — R42 Dizziness and giddiness: Secondary | ICD-10-CM

## 2021-09-22 DIAGNOSIS — M6281 Muscle weakness (generalized): Secondary | ICD-10-CM | POA: Diagnosis not present

## 2021-09-22 DIAGNOSIS — R2681 Unsteadiness on feet: Secondary | ICD-10-CM

## 2021-09-22 NOTE — Therapy (Signed)
[place] MAIN [facility] SERVICES [address] [place], [place], [address] Phone: [phone]   Fax:  [phone]  Physical Therapy Treatment  Patient Details  Name: Cory Espinoza MRN: [id number] Date of Birth: 1971-01-22 Referring Provider (PT): Dr. Joselyn Arrow   Encounter Date: 09/22/2021   PT End of Session - 09/22/21 1504     Visit Number 67    Number of Visits 81    Date for PT Re-Evaluation 11/10/21    Authorization Type BCBS PPO;    Authorization Time Period 2/10 PN 10/3    PT Start Time 1515    PT Stop Time 1559    PT Time Calculation (min) 44 min    Equipment Utilized During Treatment Gait belt    Activity Tolerance Patient tolerated treatment well;Treatment limited secondary to medical complications (Comment)   provoked symptoms with certain activities   Behavior During Therapy WFL for tasks assessed/performed             Past Medical History:  Diagnosis Date   Allergy    Anginal pain (HCC)    Anxiety    Arthritis    lumbar spine   Asthma    Atrial fibrillation (HCC)    Back pain    Severe Lumbar Pain   CHF (congestive heart failure) (HCC)    COPD (chronic obstructive pulmonary disease) (HCC)    Restrictive lung disease   Coronary artery disease    Dyspnea    Dysrhythmia    afib   GERD (gastroesophageal reflux disease)    Headache    [name], onset October 2020, cluster headaches in the past   History of kidney stones    Hyperlipidemia    Hypertension    Myocardial infarction ([place])    at age 30   Pneumonia    Restrictive lung disease    Sleep apnea    unable to use cpap since onset of migraines    Past Surgical History:  Procedure Laterality Date   ABDOMINAL EXPOSURE N/A 03/25/2020   Procedure: ABDOMINAL EXPOSURE;  Surgeon: Rosetta Posner, MD;  Location: [facility];  Service: Vascular;  Laterality: N/A;   ANTERIOR CERVICAL DECOMP/DISCECTOMY FUSION N/A 01/23/2020   Procedure: ANTERIOR CERVICAL  DECOMPRESSION FUSION - CERVICAL SIX-CERVICAL SEVEN;  Surgeon: Earnie Larsson, MD;  Location: [place];  Service: Neurosurgery;  Laterality: N/A;   ANTERIOR LUMBAR FUSION N/A 03/25/2020   Procedure: ANTERIOR LUMBAR INTERBODY FUSION LUMBAR FIVE-SACRAL ONE.;  Surgeon: Earnie Larsson, MD;  Location: [place];  Service: Neurosurgery;  Laterality: N/A;  anterior   BACK SURGERY  1996   CARDIAC CATHETERIZATION Left 04/27/2016   Procedure: Left Heart Cath and Coronary Angiography;  Surgeon: Corey Skains, MD;  Location: [place] CV LAB;  Service: Cardiovascular;  Laterality: Left;   CARDIAC CATHETERIZATION N/A 04/27/2016   Procedure: Intravascular Pressure Wire/FFR Study;  Surgeon: Yolonda Kida, MD;  Location: [place] CV LAB;  Service: Cardiovascular;  Laterality: N/A;   CATHETERIZATION OF PULMONARY ARTERY WITH RETRIEVAL OF FOREIGN BODY Bilateral 04/07/2011   heart   DEGENERATIVE SPONDYLOLISTHESIS     KNEE ARTHROSCOPY Bilateral 2004   LUMBAR DISC SURGERY  1999   RADICULOPATHY, CERVICAL REGION     TONSILLECTOMY      There were no vitals filed for this visit.   Subjective Assessment - 09/22/21 1602     Subjective Patient had a minor fall but not to the ground earlier today. Had to sleep a lot on friday  due to his migraines. Had friends in town over the weekend from [place]. Will be going to the zoo this upcoming weekend if the weather permits.    Pertinent History Pt has a complex medical history. He reports that he fell during a [employer] training in [place] in the fall of 2020. He states that he fell 10-12 feet and struck his head, neck, and back. He was able to finish the course which took him approximately 45 minutes more to complete. He does endorse a second fall while finishing the course but did not suffer a second head injury. He states that he started getting headaches that day which have persisted since that time. He is currently under the care of neurology who is treating him for hemiplegic  migraines and R occipital neuralgia. He does report improvement in his headaches recently. Neurology was concerned about possible BPPV and have referred him for a vestibular evaluation. In addition since the injury, pt developed RUE and RLE pain and weakness. Pt states that NCV showed abnormal nerve conduction in RUE. He has since undergone a C7-7 ACDF on 01/23/20. He reports initial improvement in RUE strength, but states that he has a gradual return of his RUE weakness. He was also having significant RLE weakness and pain and underwent and L5-S1 anterior lumbar interbody fusion on 03/25/20. Patient reports that he does not have any back precautions but needs to wear the low back brace for one more month. Patient reports he has a follow-up appointment with the surgeon next month. Patient reports he has a follow-up appointment with Dr. Brigitte Pulse, neurologist, in August. He arrives to therapy ambulating with an upright rollator walker. Pt denies any mention of concussion after his injury. He has been having cognitive issues since his injury and has previously had a heavy metals screen as well as neurocognitive testing. He has had an MRI of his brain with and without contrast on 12/18/19. Results were mildly motion degraded examination. No evidence of acute intracranial abnormality. Minimal chronic small vessel ischemic disease. He has tremor in both his hands. He has a positive family history of Parkinson's disease in his grandfather. He is complaining of constant dizziness and unsteadiness which are worse with activity. Patient states that he frequently loses his balance and his wife has to steady him.    Limitations Lifting;Standing;Walking;House hold activities    Diagnostic tests He has had an MRI of his brain with and without contrast on 12/18/19. Results were mildly motion degraded examination. No evidence of acute intracranial abnormality. Minimal chronic small vessel ischemic disease.    Patient Stated Goals to be  able to ambulate with a cane or no AD, to be able to drive, to be able to ride his motorcycle    Currently in Pain? Yes    Pain Score 7     Pain Location Head    Pain Orientation Posterior    Pain Descriptors / Indicators Aching    Pain Type Chronic pain    Pain Onset More than a month ago    Pain Frequency Constant                Patient had a fall today, didn't have his brace on.     Treatment:    in room with lights off and noise damped due to migraine.    TherEx Seated:    RTB: -adduction against PT resistance 15x each LE -hamstring curl against PT resistance 10x each LE -row 15x with cue for  scapular retraction and hold.  -alternating IR/ER 15x each LE    Sit to stand training, focus on pausing before moving to ensure he is not unsteady. x 3trials   Standing static stand 60 -90 seconds; min a for stabilization due to frequent pertubation's and near LOB. X2 trials    Manual:  STM to bilateral upper traps, levator scap, and cervical paraspinals x 13 minutes, implementation of effleurage and ptrissage included.  patient reports decreased headache pain by end of session.        Pt educated throughout session about proper posture and technique with exercises. Improved exercise technique, movement at target joints, use of target muscles after min to mod verbal, visual, tactile cues   Ambulate 450 ft to elevator and upstairs to car. Car transfer with min A for RLE provided.    Pt educated throughout session about proper posture and technique with exercises. Improved exercise technique, movement at target joints, use of target muscles after min to mod verbal, visual, tactile cues.     Patient presents with increasing migraine throughout session. Manual reduced migraine by one point on VAS scale and allowed for seated task performance. Patient requires cueing for focusing on point on wall for reduced dizziness and nausea. Static standing interventions tolerated  briefly with assistance required to maintain COM. The patient continues to benefit from additional skilled PT services to improve LE strength and balance for decreased fall risk and improved quality of life                PT Education - 09/22/21 1504     Education Details exercise technique, body mechanics    Person(s) Educated Patient    Methods Explanation;Demonstration;Tactile cues;Verbal cues    Comprehension Verbalized understanding;Returned demonstration;Verbal cues required;Tactile cues required              PT Short Term Goals - 08/18/21 1636       PT SHORT TERM GOAL #1   Title Patient will be independent in home exercise program to improve strength/mobility for better functional independence with ADLs and for self-management.    Baseline 3/3 HEP compliant 3/31 hep compliant    Time 6    Period Weeks    Status Achieved    Target Date 02/20/21      PT SHORT TERM GOAL #2   Title Patient will be modified independent in walking on even/uneven surface with least restrictive assistive device, for 10+ minutes without rest break, reporting some difficulty or less to improve walking tolerance with community ambulation including grocery shopping, going to church,etc.    Baseline 02/03: instability, LOB multiple times while ambulating to elevator 3/31: unable to test due to throwing up 6/20: 6 minutes 7/12: unable to test due to migraine 9/12: defer to next session    Time 6    Period Weeks    Status Partially Met    Target Date 09/29/21      PT SHORT TERM GOAL #3   Title Patient will increase ABC scale score >80% to demonstrate better functional mobility and better confidence with ADLs.    Baseline 01/09/21: 60.625% 3/3/: 50% 3/31: 63% 6/20: 45% 7/25: 40% 9/12: 51.1%    Time 6    Period Weeks    Status Partially Met    Target Date 09/29/21               PT Long Term Goals - 09/09/21 0723       PT LONG TERM GOAL #1  Title Patient will increase FOTO  score to equal to or greater than 65 to demonstrate statistically significant improvement in mobility and quality of life.    Baseline scored 41/100 on 05/21/2020; 7/22 40, 8/23: 60/100, 09/30/20=40, 01/09/21: 46 3/3: 40.3% 3/31: 49% 6/20: 45% 7/12: 50.7% 9/12: 44.7%    Time 12    Period Weeks    Status Partially Met    Target Date 11/10/21      PT LONG TERM GOAL #2   Title Patient will reduce falls risk as indicated by decreased TUG time to less than 11 seconds.    Baseline scored 37.9 sec with TUG on 05/21/2020; 21seconds with elevated rollator 7/22, 8/23: 13.62 sec with up and go walker, 01/09/21: 15.81s with hiking stick 3/3: 17.98 seconds one near LOB 3/31: 15.57 seconds with walking stick 6/20: 14.8 seconds with walking stick. 7/25: 19.72 seconds with walking stick. 9/26: 17.6 seconds    Time 12    Period Weeks    Status Partially Met    Target Date 11/10/21      PT LONG TERM GOAL #3   Title Patient will increase right UE and LE gross strength to 4+/5 throughout to improve functional strength for independent gait, increased standing tolerance and increased ADL ability.    Baseline grossly +3/5 to -4/5 right UE and LE strength on 05/21/2020; grossly 4-/5 for RUE, grossly 4-/5 for RLE on 7/22, grossly 4-/5 RUE, grossly 4/5 RLE 3/3: see note    Time 12    Period Weeks    Status Partially Met    Target Date 11/10/21      PT LONG TERM GOAL #4   Title Patient will increase 10 meter walk test to >1.29ms as to improve gait speed for better community ambulation and to reduce fall risk.    Baseline on 7/22 .57 m/sself selected, fast  .73 m/s with LOB pt reported R knee buckling, elevated rollator used, 8/23: 1.06 m/s with up and go walker, 01/09/21: 0.72 m/s with hiking stick 3/3: 0.74 m/s with walking stick 3/31: 0.81 m/s w walking stick 6/20: 1.1 m/s with walking stick    Time 12    Period Weeks    Status Achieved      PT LONG TERM GOAL #5   Title Patient will increase Berg Balance score by  > 6 points to demonstrate decreased fall risk during functional activities.    Baseline 8/23: 37/56, 09/30/20=40/56, 12/20: 42/56 3/3: deferred 3/31 will perform next time; deferred due to throwing up 6/20: 46/56 7/12 38/56    Time 8    Period Weeks    Status Achieved      PT LONG TERM GOAL #6   Title Patient (< 664years old) will complete five times sit to stand test in < 10 seconds indicating an increased LE strength and improved balance.    Baseline 8/23: 22 sec, 09/30/20=13.04 sec, 12/20: 27.05 sec, 01/09/21: 18.01s 3/3: 23 seconds no UE support 3/31; deferred due to patient throwing up 7/12: 15.2 seconds    Time 12    Period Weeks    Status Partially Met    Target Date 11/10/21      PT LONG TERM GOAL #7   Title Patient will improve 6 min walk test >1000 feet with LRAD for improved gait ability in community.    Baseline 8/23: not tested; 8/30 845, goal adjusted >20013f 09/30/20=600 feet - stopped test due to R ankle catching making gait unsafe, 11/25/20= 330 feet -  stopped test due to severe dizziness, vomiting/nausea, sweating making gait unsafe; 01/09/21: 720 ft with no rest breaks 3/3: deferred due to nausea 3/31: deferred due to patient throwing up 6/20: 640 ft 7/12: 627f with walking stick, no rest breaks; 9/26: 1000 ft    Time 12    Period Weeks    Status Achieved      PT LONG TERM GOAL #8   Title Patient will increase Berg Balance score by > 6 points  (52/56) to demonstrate decreased fall risk during functional activities.    Baseline 6/20: 46/56 7/12: 38/56 with migraine 9/12: defer to next session    Time 12    Period Weeks    Status On-going    Target Date 11/10/21                   Plan - 09/22/21 1607     Clinical Impression Statement Patient presents with increasing migraine throughout session. Manual reduced migraine by one point on VAS scale and allowed for seated task performance. Patient requires cueing for focusing on point on wall for reduced  dizziness and nausea. Static standing interventions tolerated briefly with assistance required to maintain COM. The patient continues to benefit from additional skilled PT services to improve LE strength and balance for decreased fall risk and improved quality of life    Personal Factors and Comorbidities Comorbidity 3+;Time since onset of injury/illness/exacerbation    Comorbidities anxiety, back pain, COPD, headache, HTN, MI, sleep apnea, migraines    Examination-Activity Limitations Locomotion Level;Transfers;Bed Mobility;Stand;Stairs;Sleep;Squat;Bend    Examination-Participation Restrictions Driving;Medication Management    Stability/Clinical Decision Making Unstable/Unpredictable    Rehab Potential Fair    PT Duration 12 weeks    PT Treatment/Interventions ADLs/Self Care Home Management;Canalith Repostioning;Moist Heat;Electrical Stimulation;Gait training;Stair training;Functional mobility training;Therapeutic activities;Therapeutic exercise;Balance training;Neuromuscular re-education;Patient/family education;Manual techniques;Passive range of motion;Vestibular    PT Next Visit Plan Continue with functional mobility training and strengthening;    PT Home Exercise Plan Medbridge Access Code: [email or web address]    Consulted and Agree with Plan of Care Patient;Family member/caregiver    Family Member Consulted Wife             Patient will benefit from skilled therapeutic intervention in order to improve the following deficits and impairments:  Decreased activity tolerance, Decreased balance, Decreased cognition, Decreased mobility, Difficulty walking, Abnormal gait, Decreased range of motion, Dizziness, Pain, Impaired flexibility, Decreased strength, Impaired sensation, Decreased endurance, Decreased safety awareness  Visit Diagnosis: Muscle weakness (generalized)  Unsteadiness on feet  Dizziness and giddiness     Problem List Patient Active Problem List   Diagnosis Date Noted    Degenerative spondylolisthesis 03/25/2020   Physical deconditioning 03/04/2020   Lobar pneumonia, unspecified organism ([place] 03/04/2020   Intractable hemiplegic migraine with status migrainosus 03/02/2020   Current tobacco use 02/05/2020   Abnormal findings on diagnostic imaging of lung 02/05/2020   Shortness of breath 02/05/2020   Herniated disc, cervical 01/23/2020   Cervical spondylosis with myelopathy and radiculopathy 01/23/2020   Pre-operative respiratory examination 01/12/2020   Weakness on right side of face 12/22/2019   Osteoarthritis of spine with radiculopathy, lumbar region 12/11/2019   Intractable migraine with aura with status migrainosus 11/09/2019   Former smoker 08/27/2018   Benign essential HTN 08/27/2018   Cough productive of purulent sputum 11/08/2017   GERD (gastroesophageal reflux disease) 05/06/2017   Contusion of hand 04/22/2017   BP (high blood pressure) 04/22/2017   Oxygen desaturation 04/22/2017   Prostate lump 04/22/2017  Unstable angina ([place]) 04/27/2016   Chest pain 04/24/2016   Morbid obesity ([place]) 01/29/2016   Elevated ALT measurement 10/30/2015   Nonspecific elevation of levels of transaminase and lactic acid dehydrogenase (ldh) 10/30/2015   Reduced libido 10/29/2015   Hyperlipidemia 08/06/2015   Anxiety 08/06/2015   Atherosclerosis of coronary artery 08/06/2015   Childhood asthma 08/06/2015   Chronic obstructive pulmonary disease ([place]) 08/06/2015   OSA (obstructive sleep apnea) 08/06/2015   Anxiety disorder 08/06/2015   Atherosclerosis of native coronary artery of native heart with stable angina pectoris ([place]) 42/71/5664   Uncomplicated asthma 83/02/2200    Janna Arch, PT, DPT  09/22/2021, 4:08 PM  [place] MAIN [facility] SERVICES [address] [place], [place], [address] Phone: [phone]   Fax:  [phone]  Name: Cory Espinoza MRN: [id number] Date of Birth: 03/27/1971

## 2021-09-25 ENCOUNTER — Ambulatory Visit: Payer: BC Managed Care – PPO

## 2021-09-25 ENCOUNTER — Other Ambulatory Visit: Payer: Self-pay

## 2021-09-25 DIAGNOSIS — R262 Difficulty in walking, not elsewhere classified: Secondary | ICD-10-CM

## 2021-09-25 DIAGNOSIS — R42 Dizziness and giddiness: Secondary | ICD-10-CM

## 2021-09-25 DIAGNOSIS — M6281 Muscle weakness (generalized): Secondary | ICD-10-CM

## 2021-09-25 DIAGNOSIS — R2681 Unsteadiness on feet: Secondary | ICD-10-CM

## 2021-09-25 NOTE — Therapy (Signed)
[place] MAIN [facility] SERVICES [address] [place], [place], [address] Phone: [phone]   Fax:  [phone]  Physical Therapy Treatment  Patient Details  Name: Cory Espinoza MRN: [id number] Date of Birth: 20-Jul-1971 Referring Provider (PT): Dr. Joselyn Arrow   Encounter Date: 09/25/2021   PT End of Session - 09/25/21 1747     Visit Number 18    Number of Visits 81    Date for PT Re-Evaluation 11/10/21    Authorization Type BCBS PPO;    Authorization Time Period 3/10 PN 10/3    PT Start Time 1515    PT Stop Time 1558    PT Time Calculation (min) 43 min    Equipment Utilized During Treatment Gait belt    Activity Tolerance Patient tolerated treatment well;Treatment limited secondary to medical complications (Comment)   provoked symptoms with certain activities   Behavior During Therapy WFL for tasks assessed/performed             Past Medical History:  Diagnosis Date   Allergy    Anginal pain (HCC)    Anxiety    Arthritis    lumbar spine   Asthma    Atrial fibrillation (HCC)    Back pain    Severe Lumbar Pain   CHF (congestive heart failure) (HCC)    COPD (chronic obstructive pulmonary disease) (HCC)    Restrictive lung disease   Coronary artery disease    Dyspnea    Dysrhythmia    afib   GERD (gastroesophageal reflux disease)    Headache    [name], onset October 2020, cluster headaches in the past   History of kidney stones    Hyperlipidemia    Hypertension    Myocardial infarction ([place])    at age 71   Pneumonia    Restrictive lung disease    Sleep apnea    unable to use cpap since onset of migraines    Past Surgical History:  Procedure Laterality Date   ABDOMINAL EXPOSURE N/A 03/25/2020   Procedure: ABDOMINAL EXPOSURE;  Surgeon: Rosetta Posner, MD;  Location: [facility] OR;  Service: Vascular;  Laterality: N/A;   ANTERIOR CERVICAL DECOMP/DISCECTOMY FUSION N/A 01/23/2020   Procedure: ANTERIOR CERVICAL  DECOMPRESSION FUSION - CERVICAL SIX-CERVICAL SEVEN;  Surgeon: Earnie Larsson, MD;  Location: [place];  Service: Neurosurgery;  Laterality: N/A;   ANTERIOR LUMBAR FUSION N/A 03/25/2020   Procedure: ANTERIOR LUMBAR INTERBODY FUSION LUMBAR FIVE-SACRAL ONE.;  Surgeon: Earnie Larsson, MD;  Location: [place];  Service: Neurosurgery;  Laterality: N/A;  anterior   BACK SURGERY  1996   CARDIAC CATHETERIZATION Left 04/27/2016   Procedure: Left Heart Cath and Coronary Angiography;  Surgeon: Corey Skains, MD;  Location: [place] [facility];  Service: Cardiovascular;  Laterality: Left;   CARDIAC CATHETERIZATION N/A 04/27/2016   Procedure: Intravascular Pressure Wire/FFR Study;  Surgeon: Yolonda Kida, MD;  Location: [place] [facility];  Service: Cardiovascular;  Laterality: N/A;   CATHETERIZATION OF PULMONARY ARTERY WITH RETRIEVAL OF FOREIGN BODY Bilateral 04/07/2011   heart   DEGENERATIVE SPONDYLOLISTHESIS     KNEE ARTHROSCOPY Bilateral 2004   LUMBAR DISC SURGERY  1999   RADICULOPATHY, CERVICAL REGION     TONSILLECTOMY      There were no vitals filed for this visit.   Subjective Assessment - 09/25/21 1739     Subjective Pt reports having a fall in which he hit his head on Tuesday night.  Pt notes he hit  his head against the wall, which set off his migraines but has not had any residual effects.  Pt notes he does no have any big plans this weekend that he is aware of.    Pertinent History Pt has a complex medical history. 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 He states that he fell 10-12 feet and struck his head, neck, and back. He was able to finish the course which took him approximately 45 minutes more to complete. He does endorse a second fall while finishing the course but did not suffer a second head injury. He states that he started getting headaches that day which have persisted since that time. He is currently under the care of neurology who is treating him  for hemiplegic migraines and R occipital neuralgia. He does report improvement in his headaches recently. Neurology was concerned about possible BPPV and have referred him for a vestibular evaluation. In addition since the injury, pt developed RUE and RLE pain and weakness. Pt states that NCV showed abnormal nerve conduction in RUE. He has since undergone a C7-7 ACDF on 01/23/20. He reports initial improvement in RUE strength, but states that he has a gradual return of his RUE weakness. He was also having significant RLE weakness and pain and underwent and L5-S1 anterior lumbar interbody fusion on 03/25/20. Patient reports that he does not have any back precautions but needs to wear the low back brace for one more month. Patient reports he has a follow-up appointment with the surgeon next month. Patient reports he has a follow-up appointment with Dr. Brigitte Pulse, neurologist, in August. He arrives to therapy ambulating with an upright rollator walker. Pt denies any mention of concussion after his injury. He has been having cognitive issues since his injury and has previously had a heavy metals screen as well as neurocognitive testing. He has had an MRI of his brain with and without contrast on 12/18/19. Results were mildly motion degraded examination. No evidence of acute intracranial abnormality. Minimal chronic small vessel ischemic disease. He has tremor in both his hands. He has a positive family history of Parkinson's disease in his grandfather. He is complaining of constant dizziness and unsteadiness which are worse with activity. Patient states that he frequently loses his balance and his wife has to steady him.    Limitations Lifting;Standing;Walking;House hold activities    Diagnostic tests He has had an MRI of his brain with and without contrast on 12/18/19. Results were mildly motion degraded examination. No evidence of acute intracranial abnormality. Minimal chronic small vessel ischemic disease.    Patient  Stated Goals to be able to ambulate with a cane or no AD, to be able to drive, to be able to ride his motorcycle    Currently in Pain? Yes    Pain Score 7     Pain Location Head    Pain Orientation Posterior    Pain Descriptors / Indicators Aching    Pain Type Chronic pain    Pain Onset More than a month ago               Treatment:    in room with lights off and noise damped due to migraine.     TherEx  Seated:    -abduction against PT resistance, 2x10 each LE -adduction against PT resistance 2x15 each LE  RTB: -hamstring curl against PT resistance 2x15 each LE -row 2x15 with cue for scapular retraction and  hold.  -alternating IR/ER 2x15 each LE    Sit to stand training, focus on pausing before moving to ensure he is not unsteady. x11 bouts       PT Education - 09/25/21 1747     Education Details exercise technique, body mechanics    Person(s) Educated Patient    Methods Explanation;Demonstration;Tactile cues;Verbal cues    Comprehension Verbalized understanding;Returned demonstration;Verbal cues required;Tactile cues required              PT Short Term Goals - 08/18/21 1636       PT SHORT TERM GOAL #1   Title Patient will be independent in home exercise program to improve strength/mobility for better functional independence with ADLs and for self-management.    Baseline 3/3 HEP compliant 3/31 hep compliant    Time 6    Period Weeks    Status Achieved    Target Date 02/20/21      PT SHORT TERM GOAL #2   Title Patient will be modified independent in walking on even/uneven surface with least restrictive assistive device, for 10+ minutes without rest break, reporting some difficulty or less to improve walking tolerance with community ambulation including grocery shopping, going to church,etc.    Baseline 02/03: instability, LOB multiple times while ambulating to elevator 3/31: unable to test due to throwing up 6/20: 6 minutes 7/12: unable to test due to  migraine 9/12: defer to next session    Time 6    Period Weeks    Status Partially Met    Target Date 09/29/21      PT SHORT TERM GOAL #3   Title Patient will increase ABC scale score >80% to demonstrate better functional mobility and better confidence with ADLs.    Baseline 01/09/21: 60.625% 3/3/: 50% 3/31: 63% 6/20: 45% 7/25: 40% 9/12: 51.1%    Time 6    Period Weeks    Status Partially Met    Target Date 09/29/21               PT Long Term Goals - 09/09/21 0723       PT LONG TERM GOAL #1   Title Patient will increase FOTO score to equal to or greater than 65 to demonstrate statistically significant improvement in mobility and quality of life.    Baseline scored 41/100 on 05/21/2020; 7/22 40, 8/23: 60/100, 09/30/20=40, 01/09/21: 46 3/3: 40.3% 3/31: 49% 6/20: 45% 7/12: 50.7% 9/12: 44.7%    Time 12    Period Weeks    Status Partially Met    Target Date 11/10/21      PT LONG TERM GOAL #2   Title Patient will reduce falls risk as indicated by decreased TUG time to less than 11 seconds.    Baseline scored 37.9 sec with TUG on 05/21/2020; 21seconds with elevated rollator 7/22, 8/23: 13.62 sec with up and go walker, 01/09/21: 15.81s with hiking stick 3/3: 17.98 seconds one near LOB 3/31: 15.57 seconds with walking stick 6/20: 14.8 seconds with walking stick. 7/25: 19.72 seconds with walking stick. 9/26: 17.6 seconds    Time 12    Period Weeks    Status Partially Met    Target Date 11/10/21      PT LONG TERM GOAL #3   Title Patient will increase right UE and LE gross strength to 4+/5 throughout to improve functional strength for independent gait, increased standing tolerance and increased ADL ability.    Baseline grossly +3/5 to -4/5 right UE and LE strength  on 05/21/2020; grossly 4-/5 for RUE, grossly 4-/5 for RLE on 7/22, grossly 4-/5 RUE, grossly 4/5 RLE 3/3: see note    Time 12    Period Weeks    Status Partially Met    Target Date 11/10/21      PT LONG TERM GOAL #4    Title Patient will increase 10 meter walk test to >1.17ms as to improve gait speed for better community ambulation and to reduce fall risk.    Baseline on 7/22 .57 m/sself selected, fast  .73 m/s with LOB pt reported R knee buckling, elevated rollator used, 8/23: 1.06 m/s with up and go walker, 01/09/21: 0.72 m/s with hiking stick 3/3: 0.74 m/s with walking stick 3/31: 0.81 m/s w walking stick 6/20: 1.1 m/s with walking stick    Time 12    Period Weeks    Status Achieved      PT LONG TERM GOAL #5   Title Patient will increase Berg Balance score by > 6 points to demonstrate decreased fall risk during functional activities.    Baseline 8/23: 37/56, 09/30/20=40/56, 12/20: 42/56 3/3: deferred 3/31 will perform next time; deferred due to throwing up 6/20: 46/56 7/12 38/56    Time 8    Period Weeks    Status Achieved      PT LONG TERM GOAL #6   Title Patient (< 635years old) will complete five times sit to stand test in < 10 seconds indicating an increased LE strength and improved balance.    Baseline 8/23: 22 sec, 09/30/20=13.04 sec, 12/20: 27.05 sec, 01/09/21: 18.01s 3/3: 23 seconds no UE support 3/31; deferred due to patient throwing up 7/12: 15.2 seconds    Time 12    Period Weeks    Status Partially Met    Target Date 11/10/21      PT LONG TERM GOAL #7   Title Patient will improve 6 min walk test >1000 feet with LRAD for improved gait ability in community.    Baseline 8/23: not tested; 8/30 845, goal adjusted >20021f 09/30/20=600 feet - stopped test due to R ankle catching making gait unsafe, 11/25/20= 330 feet - stopped test due to severe dizziness, vomiting/nausea, sweating making gait unsafe; 01/09/21: 720 ft with no rest breaks 3/3: deferred due to nausea 3/31: deferred due to patient throwing up 6/20: 640 ft 7/12: [id number] walking stick, no rest breaks; 9/26: 1000 ft    Time 12    Period Weeks    Status Achieved      PT LONG TERM GOAL #8   Title Patient will increase Berg Balance  score by > 6 points  (52/56) to demonstrate decreased fall risk during functional activities.    Baseline 6/20: 46/56 7/12: 38/56 with migraine 9/12: defer to next session    Time 12    Period Weeks    Status On-going    Target Date 11/10/21                   Plan - 09/25/21 1748     Personal Factors and Comorbidities Comorbidity 3+;Time since onset of injury/illness/exacerbation    Comorbidities anxiety, back pain, COPD, headache, HTN, MI, sleep apnea, migraines    Examination-Activity Limitations Locomotion Level;Transfers;Bed Mobility;Stand;Stairs;Sleep;Squat;Bend    Examination-Participation Restrictions Driving;Medication Management    Stability/Clinical Decision Making Unstable/Unpredictable    Rehab Potential Fair    PT Duration 12 weeks    PT Treatment/Interventions ADLs/Self Care Home Management;Canalith Repostioning;Moist Heat;Electrical Stimulation;Gait training;Stair training;Functional mobility training;Therapeutic  activities;Therapeutic exercise;Balance training;Neuromuscular re-education;Patient/family education;Manual techniques;Passive range of motion;Vestibular    PT Next Visit Plan Continue with functional mobility training and strengthening;    PT Home Exercise Plan Medbridge Access Code: [email or web address]    Consulted and Agree with Plan of Care Patient;Family member/caregiver    Family Member Consulted Wife             Patient will benefit from skilled therapeutic intervention in order to improve the following deficits and impairments:  Decreased activity tolerance, Decreased balance, Decreased cognition, Decreased mobility, Difficulty walking, Abnormal gait, Decreased range of motion, Dizziness, Pain, Impaired flexibility, Decreased strength, Impaired sensation, Decreased endurance, Decreased safety awareness  Visit Diagnosis: Difficulty in walking, not elsewhere classified  Muscle weakness (generalized)  Unsteadiness on feet  Dizziness and  giddiness     Problem List Patient Active Problem List   Diagnosis Date Noted   Degenerative spondylolisthesis 03/25/2020   Physical deconditioning 03/04/2020   Lobar pneumonia, unspecified organism ([place]) 03/04/2020   Intractable hemiplegic migraine with status migrainosus 03/02/2020   Current tobacco use 02/05/2020   Abnormal findings on diagnostic imaging of lung 02/05/2020   Shortness of breath 02/05/2020   Herniated disc, cervical 01/23/2020   Cervical spondylosis with myelopathy and radiculopathy 01/23/2020   Pre-operative respiratory examination 01/12/2020   Weakness on right side of face 12/22/2019   Osteoarthritis of spine with radiculopathy, lumbar region 12/11/2019   Intractable migraine with aura with status migrainosus 11/09/2019   Former smoker 08/27/2018   Benign essential HTN 08/27/2018   Cough productive of purulent sputum 11/08/2017   GERD (gastroesophageal reflux disease) 05/06/2017   Contusion of hand 04/22/2017   BP (high blood pressure) 04/22/2017   Oxygen desaturation 04/22/2017   Prostate lump 04/22/2017   Unstable angina ([place]) 04/27/2016   Chest pain 04/24/2016   Morbid obesity ([place]) 01/29/2016   Elevated ALT measurement 10/30/2015   Nonspecific elevation of levels of transaminase and lactic acid dehydrogenase (ldh) 10/30/2015   Reduced libido 10/29/2015   Hyperlipidemia 08/06/2015   Anxiety 08/06/2015   Atherosclerosis of coronary artery 08/06/2015   Childhood asthma 08/06/2015   Chronic obstructive pulmonary disease ([place]) 08/06/2015   OSA (obstructive sleep apnea) 08/06/2015   Anxiety disorder 08/06/2015   Atherosclerosis of native coronary artery of native heart with stable angina pectoris ([place]) 60/67/7034   Uncomplicated asthma 03/52/4818    Gwenlyn Saran, PT, DPT 09/25/21, 5:55 PM  Christie Nottingham, PT 09/25/2021, 5:49 PM  [place] MAIN [facility] SERVICES [address] [place], [place],  [address] Phone: [phone]   Fax:  [phone]  Name: Cory Espinoza MRN: [id number] Date of Birth: Jan 20, 1971

## 2021-09-29 ENCOUNTER — Ambulatory Visit: Payer: BC Managed Care – PPO

## 2021-09-29 ENCOUNTER — Other Ambulatory Visit: Payer: Self-pay

## 2021-09-29 MED ORDER — NITROGLYCERIN 0.4 MG SL SUBL: 0.4000 mg | SUBLINGUAL_TABLET | SUBLINGUAL | 1 refills | Status: DC | PRN

## 2021-10-02 ENCOUNTER — Ambulatory Visit: Payer: BC Managed Care – PPO

## 2021-10-06 ENCOUNTER — Ambulatory Visit: Payer: BC Managed Care – PPO

## 2021-10-09 ENCOUNTER — Ambulatory Visit: Payer: BC Managed Care – PPO

## 2021-10-13 ENCOUNTER — Ambulatory Visit: Payer: BC Managed Care – PPO

## 2021-10-16 ENCOUNTER — Ambulatory Visit: Payer: BC Managed Care – PPO

## 2021-10-20 ENCOUNTER — Ambulatory Visit: Payer: BC Managed Care – PPO | Attending: Neurology

## 2021-10-20 ENCOUNTER — Other Ambulatory Visit: Payer: Self-pay

## 2021-10-20 DIAGNOSIS — M6281 Muscle weakness (generalized): Secondary | ICD-10-CM | POA: Diagnosis present

## 2021-10-20 DIAGNOSIS — R2681 Unsteadiness on feet: Secondary | ICD-10-CM | POA: Diagnosis present

## 2021-10-20 DIAGNOSIS — R262 Difficulty in walking, not elsewhere classified: Secondary | ICD-10-CM | POA: Diagnosis not present

## 2021-10-20 NOTE — Therapy (Signed)
[place] MAIN [facility] SERVICES [address] [place], [place], [address] Phone: [phone]   Fax:  [phone]  Physical Therapy Treatment  Patient Details  Name: Cory Espinoza MRN: [id number] Date of Birth: 1971-06-16 Referring Provider (PT): Dr. Joselyn Arrow   Encounter Date: 10/20/2021   PT End of Session - 10/20/21 1457     Visit Number 22    Number of Visits 81    Date for PT Re-Evaluation 11/10/21    Authorization Type BCBS PPO;    Authorization Time Period 4/10 PN 10/3    PT Start Time 1515    PT Stop Time 1559    PT Time Calculation (min) 44 min    Equipment Utilized During Treatment Gait belt    Activity Tolerance Patient tolerated treatment well;Treatment limited secondary to medical complications (Comment)   provoked symptoms with certain activities   Behavior During Therapy WFL for tasks assessed/performed             Past Medical History:  Diagnosis Date   Allergy    Anginal pain (HCC)    Anxiety    Arthritis    lumbar spine   Asthma    Atrial fibrillation (HCC)    Back pain    Severe Lumbar Pain   CHF (congestive heart failure) (HCC)    COPD (chronic obstructive pulmonary disease) (HCC)    Restrictive lung disease   Coronary artery disease    Dyspnea    Dysrhythmia    afib   GERD (gastroesophageal reflux disease)    Headache    [name], onset October 50, cluster headaches in the past   History of kidney stones    Hyperlipidemia    Hypertension    Myocardial infarction ([place])    at age 50   Pneumonia    Restrictive lung disease    Sleep apnea    unable to use cpap since onset of migraines    Past Surgical History:  Procedure Laterality Date   ABDOMINAL EXPOSURE N/A 03/25/2020   Procedure: ABDOMINAL EXPOSURE;  Surgeon: Rosetta Posner, MD;  Location: [facility] OR;  Service: Vascular;  Laterality: N/A;   ANTERIOR CERVICAL DECOMP/DISCECTOMY FUSION N/A 01/23/2020   Procedure: ANTERIOR CERVICAL  DECOMPRESSION FUSION - CERVICAL SIX-CERVICAL SEVEN;  Surgeon: Earnie Larsson, MD;  Location: [place];  Service: Neurosurgery;  Laterality: N/A;   ANTERIOR LUMBAR FUSION N/A 03/25/2020   Procedure: ANTERIOR LUMBAR INTERBODY FUSION LUMBAR FIVE-SACRAL ONE.;  Surgeon: Earnie Larsson, MD;  Location: [facility];  Service: Neurosurgery;  Laterality: N/A;  anterior   BACK SURGERY  1996   CARDIAC CATHETERIZATION Left 04/27/2016   Procedure: Left Heart Cath and Coronary Angiography;  Surgeon: Corey Skains, MD;  Location: [place] CV LAB;  Service: Cardiovascular;  Laterality: Left;   CARDIAC CATHETERIZATION N/A 04/27/2016   Procedure: Intravascular Pressure Wire/FFR Study;  Surgeon: Yolonda Kida, MD;  Location: [place] [facility];  Service: Cardiovascular;  Laterality: N/A;   CATHETERIZATION OF PULMONARY ARTERY WITH RETRIEVAL OF FOREIGN BODY Bilateral 04/07/2011   heart   DEGENERATIVE SPONDYLOLISTHESIS     KNEE ARTHROSCOPY Bilateral 2004   LUMBAR DISC SURGERY  1999   RADICULOPATHY, CERVICAL REGION     TONSILLECTOMY      There were no vitals filed for this visit.   Subjective Assessment - 10/20/21 1456     Subjective Patient is returning to PT after almost a month's absence from illness.    Pertinent History Pt has  a complex medical history. 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 He states that he fell 10-12 feet and struck his head, neck, and back. He was able to finish the course which took him approximately 45 minutes more to complete. He does endorse a second fall while finishing the course but did not suffer a second head injury. He states that he started getting headaches that day which have persisted since that time. 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 He does report improvement in his headaches recently. Neurology was concerned about possible BPPV and have referred him for a  vestibular evaluation. In addition since the injury, pt developed RUE and RLE pain and weakness. Pt states that NCV showed abnormal nerve conduction in RUE. He has since undergone a C7-7 ACDF on 01/23/20. He reports initial improvement in RUE strength, but states that he has a gradual return of his RUE weakness. He was also having significant RLE weakness and pain and underwent and L5-S1 anterior lumbar interbody fusion on 03/25/20. Patient reports that he does not have any back precautions but needs to wear the low back brace for one more month. Patient reports he has a follow-up appointment with the surgeon next month. Patient reports he has a follow-up appointment with Dr. Brigitte Pulse, neurologist, in August. He arrives to therapy ambulating with an upright rollator walker. Pt denies any mention of concussion after his injury. He has been having cognitive issues since his injury and has previously had a heavy metals screen as well as neurocognitive testing. He has had an MRI of his brain with and without contrast on 12/18/19. Results were mildly motion degraded examination. No evidence of acute intracranial abnormality. Minimal chronic small vessel ischemic disease. He has tremor in both his hands. He has a positive family history of Parkinson's disease in his grandfather. He is complaining of constant dizziness and unsteadiness which are worse with activity. Patient states that he frequently loses his balance and his wife has to steady him.    Limitations Lifting;Standing;Walking;House hold activities    Diagnostic tests He has had an MRI of his brain with and without contrast on 12/18/19. Results were mildly motion degraded examination. No evidence of acute intracranial abnormality. Minimal chronic small vessel ischemic disease.    Patient Stated Goals to be able to ambulate with a cane or no AD, to be able to drive, to be able to ride his motorcycle    Currently in Pain? Yes    Pain Score 6     Pain Location Head     Pain Orientation Posterior    Pain Descriptors / Indicators Aching    Pain Type Chronic pain    Pain Onset More than a month ago    Pain Frequency Constant                 Hasn't been seen since 09/25/21 due to having bronchitis.   BP at start of session: 140/84   Goals: Walk 10 minutes: limited by bronchitis  ABC: 48%  FOTO: 40%  TUG; unable to test due to migraine RUE/RLE strength 5x STS: 26 seconds  BERG: unable to test due to migraine    Treatment:    in room with lights off and noise damped due to migraine.    3lb ankle weight: -LAQ 10x each LE -march to PT hand 15x each LE -IR/ER  15x each LE  GTB -adduction 15x each LE -hamstring curl 15x each LE -paloff press 10x; terminated due to nausea     Ambulate 450 ft to elevator and upstairs to car. Car transfer with min A for RLE provided.    Pt educated throughout session about proper posture and technique with exercises. Improved exercise technique, movement at target joints, use of target muscles after min to mod verbal, visual, tactile cues.      Patient has returned from ~ one month absence from severe illness. Goals re-assessed and is decreased as his illness and absence has affected his mobility. Patient's migraine increased to 9/10 during session requiring assistance to return to car. Patient has significant need for therapy as can be seen in decline in absence from therapy. The patient continues to benefit from additional skilled PT services to improve LE strength and balance for decreased fall risk and improved quality of life                  PT Education - 10/20/21 1457     Education Details exercise technique, body mechanics, return to PT    Person(s) Educated Patient    Methods Explanation;Demonstration;Tactile cues;Verbal cues    Comprehension Verbalized understanding;Returned demonstration;Verbal cues required;Tactile cues required              PT Short Term Goals -  10/21/21 0700       PT SHORT TERM GOAL #1   Title Patient will be independent in home exercise program to improve strength/mobility for better functional independence with ADLs and for self-management.    Baseline 3/3 HEP compliant 3/31 hep compliant    Time 6    Period Weeks    Status Achieved    Target Date 02/20/21      PT SHORT TERM GOAL #2   Title Patient will be modified independent in walking on even/uneven surface with least restrictive assistive device, for 10+ minutes without rest break, reporting some difficulty or less to improve walking tolerance with community ambulation including grocery shopping, going to church,etc.    Baseline 02/03: instability, LOB multiple times while ambulating to elevator 3/31: unable to test due to throwing up 6/20: 6 minutes 7/12: unable to test due to migraine 9/12: defer to next session 11/15: unable to test due to migraine    Time 6    Period Weeks    Status Partially Met    Target Date 11/10/21      PT SHORT TERM GOAL #3   Title Patient will increase ABC scale score >80% to demonstrate better functional mobility and better confidence with ADLs.    Baseline 01/09/21: 60.625% 3/3/: 50% 3/31: 63% 6/20: 45% 7/25: 40% 9/12: 51.1% 11/15: 48%    Time 6    Period Weeks    Status Partially Met    Target Date 11/10/21               PT Long Term Goals - 10/21/21 0701       PT LONG TERM GOAL #1   Title Patient will increase FOTO score to equal to or greater than 65 to demonstrate statistically significant improvement in mobility and quality of life.    Baseline scored 41/100 on 05/21/2020; 7/22 40, 8/23: 60/100, 09/30/20=40, 01/09/21: 46 3/3: 40.3% 3/31: 49% 6/20: 45% 7/12: 50.7% 9/12: 44.7% 11/15: 40%    Time 12    Period Weeks    Status Partially Met    Target Date 11/10/21  PT LONG TERM GOAL #2   Title Patient will reduce falls risk as indicated by decreased TUG time to less than 11 seconds.    Baseline scored 37.9 sec with TUG on  05/21/2020; 21seconds with elevated rollator 7/22, 8/23: 13.62 sec with up and go walker, 01/09/21: 15.81s with hiking stick 3/3: 17.98 seconds one near LOB 3/31: 15.57 seconds with walking stick 6/20: 14.8 seconds with walking stick. 7/25: 19.72 seconds with walking stick. 9/26: 17.6 seconds 11/15: unable to test due to migraine    Time 12    Period Weeks    Status Partially Met    Target Date 11/10/21      PT LONG TERM GOAL #3   Title Patient will increase right UE and LE gross strength to 4+/5 throughout to improve functional strength for independent gait, increased standing tolerance and increased ADL ability.    Baseline grossly +3/5 to -4/5 right UE and LE strength on 05/21/2020; grossly 4-/5 for RUE, grossly 4-/5 for RLE on 7/22, grossly 4-/5 RUE, grossly 4/5 RLE 3/3: see note    Time 12    Period Weeks    Status Partially Met    Target Date 11/10/21      PT LONG TERM GOAL #4   Title Patient will increase 10 meter walk test to >1.19ms as to improve gait speed for better community ambulation and to reduce fall risk.    Baseline on 7/22 .57 m/sself selected, fast  .73 m/s with LOB pt reported R knee buckling, elevated rollator used, 8/23: 1.06 m/s with up and go walker, 01/09/21: 0.72 m/s with hiking stick 3/3: 0.74 m/s with walking stick 3/31: 0.81 m/s w walking stick 6/20: 1.1 m/s with walking stick    Time 12    Period Weeks    Status Achieved      PT LONG TERM GOAL #5   Title Patient will increase Berg Balance score by > 6 points to demonstrate decreased fall risk during functional activities.    Baseline 8/23: 37/56, 09/30/20=40/56, 12/20: 42/56 3/3: deferred 3/31 will perform next time; deferred due to throwing up 6/20: 46/56 7/12 38/56    Time 8    Period Weeks    Status Achieved      PT LONG TERM GOAL #6   Title Patient (< 679years old) will complete five times sit to stand test in < 10 seconds indicating an increased LE strength and improved balance.    Baseline 8/23: 22  sec, 09/30/20=13.04 sec, 12/20: 27.05 sec, 01/09/21: 18.01s 3/3: 23 seconds no UE support 3/31; deferred due to patient throwing up 7/12: 15.2 seconds 11/15: 26 seconds    Time 12    Period Weeks    Status Partially Met    Target Date 11/10/21      PT LONG TERM GOAL #7   Title Patient will improve 6 min walk test >1000 feet with LRAD for improved gait ability in community.    Baseline 8/23: not tested; 8/30 845, goal adjusted >20040f 09/30/20=600 feet - stopped test due to R ankle catching making gait unsafe, 11/25/20= 330 feet - stopped test due to severe dizziness, vomiting/nausea, sweating making gait unsafe; 01/09/21: 720 ft with no rest breaks 3/3: deferred due to nausea 3/31: deferred due to patient throwing up 6/20: 640 ft 7/12: 66021fith walking stick, no rest breaks; 9/26: 1000 ft    Time 12    Period Weeks    Status Achieved      PT  LONG TERM GOAL #8   Title Patient will increase Berg Balance score by > 6 points  (52/56) to demonstrate decreased fall risk during functional activities.    Baseline 6/20: 46/56 7/12: 38/56 with migraine 9/12: defer to next session 11/15:  unable to test due to migraine    Time 12    Period Weeks    Status On-going    Target Date 11/10/21                   Plan - 10/21/21 0705     Clinical Impression Statement Patient has returned from ~ one month absence from severe illness. Goals re-assessed and is decreased as his illness and absence has affected his mobility. Patient's migraine increased to 9/10 during session requiring assistance to return to car. Patient has significant need for therapy as can be seen in decline in absence from therapy. The patient continues to benefit from additional skilled PT services to improve LE strength and balance for decreased fall risk and improved quality of life    Personal Factors and Comorbidities Comorbidity 3+;Time since onset of injury/illness/exacerbation    Comorbidities anxiety, back pain, COPD,  headache, HTN, MI, sleep apnea, migraines    Examination-Activity Limitations Locomotion Level;Transfers;Bed Mobility;Stand;Stairs;Sleep;Squat;Bend    Examination-Participation Restrictions Driving;Medication Management    Stability/Clinical Decision Making Unstable/Unpredictable    Rehab Potential Fair    PT Duration 12 weeks    PT Treatment/Interventions ADLs/Self Care Home Management;Canalith Repostioning;Moist Heat;Electrical Stimulation;Gait training;Stair training;Functional mobility training;Therapeutic activities;Therapeutic exercise;Balance training;Neuromuscular re-education;Patient/family education;Manual techniques;Passive range of motion;Vestibular    PT Next Visit Plan Continue with functional mobility training and strengthening;    PT Home Exercise Plan Medbridge Access Code: [email or web address]    Consulted and Agree with Plan of Care Patient;Family member/caregiver    Family Member Consulted Wife             Patient will benefit from skilled therapeutic intervention in order to improve the following deficits and impairments:  Decreased activity tolerance, Decreased balance, Decreased cognition, Decreased mobility, Difficulty walking, Abnormal gait, Decreased range of motion, Dizziness, Pain, Impaired flexibility, Decreased strength, Impaired sensation, Decreased endurance, Decreased safety awareness  Visit Diagnosis: Difficulty in walking, not elsewhere classified  Muscle weakness (generalized)  Unsteadiness on feet     Problem List Patient Active Problem List   Diagnosis Date Noted   Degenerative spondylolisthesis 03/25/2020   Physical deconditioning 03/04/2020   Lobar pneumonia, unspecified organism ([place]) 03/04/2020   Intractable hemiplegic migraine with status migrainosus 03/02/2020   Current tobacco use 02/05/2020   Abnormal findings on diagnostic imaging of lung 02/05/2020   Shortness of breath 02/05/2020   Herniated disc, cervical 01/23/2020   Cervical spondylosis  with myelopathy and radiculopathy 01/23/2020   Pre-operative respiratory examination 01/12/2020   Weakness on right side of face 12/22/2019   Osteoarthritis of spine with radiculopathy, lumbar region 12/11/2019   Intractable migraine with aura with status migrainosus 11/09/2019   Former smoker 08/27/2018   Benign essential HTN 08/27/2018   Cough productive of purulent sputum 11/08/2017   GERD (gastroesophageal reflux disease) 05/06/2017   Contusion of hand 04/22/2017   BP (high blood pressure) 04/22/2017   Oxygen desaturation 04/22/2017   Prostate lump 04/22/2017   Unstable angina ([place]) 04/27/2016   Chest pain 04/24/2016   Morbid obesity ([place]) 01/29/2016   Elevated ALT measurement 10/30/2015   Nonspecific elevation of levels of transaminase and lactic acid dehydrogenase (ldh) 10/30/2015   Reduced libido 10/29/2015   Hyperlipidemia 08/06/2015   Anxiety  08/06/2015   Atherosclerosis of coronary artery 08/06/2015   Childhood asthma 08/06/2015   Chronic obstructive pulmonary disease ([place]) 08/06/2015   OSA (obstructive sleep apnea) 08/06/2015   Anxiety disorder 08/06/2015   Atherosclerosis of native coronary artery of native heart with stable angina pectoris ([place]) 35/57/3378   Uncomplicated asthma 12/15/1063   Janna Arch, PT, DPT  10/21/2021, 7:05 AM  [place] MAIN [facility] SERVICES [address] [place], [place], [address] Phone: [phone]   Fax:  [phone]  Name: EWEN VARNELL MRN: [id number] Date of Birth: 1971-10-15

## 2021-10-21 ENCOUNTER — Ambulatory Visit (INDEPENDENT_AMBULATORY_CARE_PROVIDER_SITE_OTHER): Payer: BC Managed Care – PPO | Admitting: Cardiovascular Disease

## 2021-10-21 ENCOUNTER — Encounter: Payer: Self-pay | Admitting: Cardiovascular Disease

## 2021-10-21 VITALS — BP 134/86 | Ht 72.0 in | Wt 269.0 lb

## 2021-10-21 DIAGNOSIS — I10 Essential (primary) hypertension: Secondary | ICD-10-CM

## 2021-10-21 DIAGNOSIS — J432 Centrilobular emphysema: Secondary | ICD-10-CM

## 2021-10-21 DIAGNOSIS — E782 Mixed hyperlipidemia: Secondary | ICD-10-CM

## 2021-10-21 DIAGNOSIS — I25118 Atherosclerotic heart disease of native coronary artery with other forms of angina pectoris: Secondary | ICD-10-CM | POA: Diagnosis not present

## 2021-10-21 DIAGNOSIS — G4733 Obstructive sleep apnea (adult) (pediatric): Secondary | ICD-10-CM

## 2021-10-21 DIAGNOSIS — Z720 Tobacco use: Secondary | ICD-10-CM

## 2021-10-21 DIAGNOSIS — I2510 Atherosclerotic heart disease of native coronary artery without angina pectoris: Secondary | ICD-10-CM | POA: Insufficient documentation

## 2021-10-21 MED ORDER — EZETIMIBE 10 MG PO TABS: 10.0000 mg | ORAL_TABLET | Freq: Every day | ORAL | 3 refills | Status: DC

## 2021-10-21 NOTE — Patient Instructions (Addendum)
Medication Instructions:  Please START  Zetia 10 mg once a day  If you need a refill on your cardiac medications before your next appointment, please call your pharmacy.   Lab work: No new labs needed  Testing/Procedures: No new testing needed  Follow-Up: At [facility], you and your health needs are our priority.  As part of our continuing mission to provide you with exceptional heart care, we have created designated Provider Care Teams.  These Care Teams include your primary Cardiologist (physician) and Advanced Practice Providers (APPs -  Physician Assistants and Nurse Practitioners) who all work together to provide you with the care you need, when you need it.  You will need a follow up appointment in 12 months  Providers on your designated Care Team:   Nicolasa Ducking, NP Eula Listen, PA-C Cadence Fransico [place], [place]   COVID-19 Vaccine Information can be found at: [email or web address] For questions related to vaccine distribution or appointments, please email [email or web address] or call [phone].

## 2021-10-21 NOTE — Progress Notes (Signed)
Date:  10/21/2021   ID:  [name], DOB [birth year], MRN [id number]  Patient Location:  [address] [place] [address]   Provider location:   [facility], [place] office  PCP:  Jerl Mina, MD  Cardiologist:  Hubbard Robinson [facility]  Chief Complaint  Patient presents with   Follow-up    1 year annual follow up and medications verbally reviewed with patient.      History of Present Illness:    Cory Espinoza is a 50 y.o. male with past medical history of Smoker, COPD, quit 6 years ago Anxiety Hyperlipidemia [name], cardiac catheterization  04/27/2016 HTN OSA, not on CPAP 11/2017: PNA Prior withdrawal from xanax 02/2018 Allergy to asa (swelling in throat/face) Who presents for follow-up of his coronary artery disease, stable angina  In follow-up he presents with his wife Significant changes have occurred since his last clinic visit Reports having severe migraines,  History of falls, hit head on occasions Back surgery x2  Subsequent complications from above, now with Memory issue, long term , sometimes short term memory Doing PT, legs are weak, walks with a walker Chronic tremor  Left upper shoulder, chest pain, when laying at night,  Took NTG, could have been GERD  EKG personally reviewed by myself on todays visit Nsr rate 83 no significant ST or T wave changes  CT chest 03/04/2018 CT chest 01/03/2018  cardiac catheterization  04/27/2016 Lesions initially felt to be 70 or 75%, downgraded after FFR negative FFR of AV groove lesion in OM1 ostial lesion Ost 2nd Mrg lesion, 60% stenosed. Mid Cx lesion, 50% stenosed.   Prior review of lab work shows Total cholesterol 148 LDL 88 Borderline sugars, glucose 100  Past Medical History:  Diagnosis Date   Allergy    Anginal pain (HCC)    Anxiety    Arthritis    lumbar spine   Asthma    Atrial fibrillation (HCC)    Back pain    Severe Lumbar Pain   CHF (congestive heart  failure) (HCC)    COPD (chronic obstructive pulmonary disease) (HCC)    Restrictive lung disease   Coronary artery disease    Dyspnea    Dysrhythmia    afib   GERD (gastroesophageal reflux disease)    Headache    [name], onset October 2020, cluster headaches in the past   History of kidney stones    Hyperlipidemia    Hypertension    Myocardial infarction (HCC)    at age 88   Pneumonia    Restrictive lung disease    Sleep apnea    unable to use cpap since onset of migraines   Past Surgical History:  Procedure Laterality Date   ABDOMINAL EXPOSURE N/A 03/25/2020   Procedure: ABDOMINAL EXPOSURE;  Surgeon: Larina Earthly, MD;  Location[facility] OR;  Service: Vascular;  Laterality: N/A;   ANTERIOR CERVICAL DECOMP/DISCECTOMY FUSION N/A 01/23/2020   Procedure: ANTERIOR CERVICAL DECOMPRESSION FUSION - CERVICAL SIX-CERVICAL SEVEN;  Surgeon: Julio Sicks, MD;  Location: [facility] OR;  Service: Neurosurgery;  Laterality: N/A;   ANTERIOR LUMBAR FUSION N/A 03/25/2020   Procedure: ANTERIOR LUMBAR INTERBODY FUSION LUMBAR FIVE-SACRAL ONE.;  Surgeon: Julio Sicks, MD;  Location: [facility] OR;  Service: Neurosurgery;  Laterality: N/A;  anterior   BACK SURGERY  1996   CARDIAC CATHETERIZATION Left 04/27/2016   Procedure: Left Heart Cath and Coronary Angiography;  Surgeon: Lamar Blinks, MD;  Location: [facility] INVASIVE CV LAB;  Service: Cardiovascular;  Laterality: Left;   CARDIAC CATHETERIZATION N/A 04/27/2016   Procedure: Intravascular Pressure Wire/FFR Study;  Surgeon: Alwyn Pea, MD;  Location: [facility] INVASIVE CV LAB;  Service: Cardiovascular;  Laterality: N/A;   CATHETERIZATION OF PULMONARY ARTERY WITH RETRIEVAL OF FOREIGN BODY Bilateral 04/07/2011   heart   DEGENERATIVE SPONDYLOLISTHESIS     KNEE ARTHROSCOPY Bilateral 2004   LUMBAR DISC SURGERY  1999   RADICULOPATHY, CERVICAL REGION     TONSILLECTOMY        Allergies:   Aspirin, Amoxicillin, Codeine, Penicillins, Cefdinir, and Hydrocodone-acetaminophen    Social History   Tobacco Use   Smoking status: Former    Packs/day: 2.00    Years: 23.00    Pack years: 46.00    Types: Cigarettes    Start date: 10/28/1989    Quit date: 10/28/2012    Years since quitting: 8.9   Smokeless tobacco: Current    Types: Snuff  Vaping Use   Vaping Use: Never used  Substance Use Topics   Alcohol use: Yes    Alcohol/week: 0.0 standard drinks    Comment: rarely   Drug use: No     Current Outpatient Medications on File Prior to Visit  Medication Sig Dispense Refill   ALPRAZolam (XANAX) 1 MG tablet Take by mouth.     naratriptan (AMERGE) 2.5 MG tablet TAKE 1 TABLET(2.5 MG) BY MOUTH 1 TIME FOR UP TO 1 DOSE AS NEEDED FOR MIGRAINE. MAY TAKE A SECOND DOSE AFTER 4 HOURS AS NEEDED     OXcarbazepine ER (OXTELLAR XR) 600 MG TB24 TAKE 2 TABLETS(1200 MG) BY MOUTH EVERY DAY     albuterol (PROVENTIL HFA;VENTOLIN HFA) 108 (90 Base) MCG/ACT inhaler Inhale 1-2 puffs into the lungs every 4 (four) hours as needed for wheezing or shortness of breath. 1 Inhaler 5   atorvastatin (LIPITOR) 40 MG tablet Take 1 tablet (40 mg total) by mouth at bedtime. 90 tablet 1   B COMPLEX VITAMINS PO Take 1 tablet by mouth daily.      baclofen (LIORESAL) 10 MG tablet Take 10 mg by mouth 2 (two) times daily as needed for muscle spasms.     benzonatate (TESSALON) 200 MG capsule Take 1 capsule (200 mg total) by mouth 3 (three) times daily as needed for cough. 30 capsule 2   calcium carbonate (OS-CAL - DOSED IN MG OF ELEMENTAL CALCIUM) 1250 (500 Ca) MG tablet Take by mouth.     cetirizine (ZYRTEC) 10 MG tablet TAKE 1 TABLET(10 MG) BY MOUTH DAILY 90 tablet 1   Cholecalciferol (VITAMIN D) 125 MCG (5000 UT) CAPS Take 5,000 Units by mouth daily.     clopidogrel (PLAVIX) 75 MG tablet Take 1 tablet (75 mg total) by mouth daily. 90 tablet 1   DULoxetine (CYMBALTA) 60 MG capsule Take 60 mg by mouth daily.     EMGALITY 120 MG/ML SOAJ SMARTSIG:1 Milliliter(s) SUB-Q Every 4 Weeks     escitalopram  (LEXAPRO) 20 MG tablet Take 20 mg by mouth at bedtime.      fluticasone (FLONASE) 50 MCG/ACT nasal spray Place into the nose.     ipratropium-albuterol (DUONEB) 0.5-2.5 (3) MG/3ML SOLN Take 3 mLs by nebulization every 4 (four) hours as needed. DX: J44.9 COPD 360 mL 3   magnesium oxide (MAG-OX) 400 MG tablet Take 400 mg by mouth daily.      Melatonin 10 MG TABS Take 30 mg by mouth at bedtime.     nitroGLYCERIN (NITROSTAT) 0.4 MG SL tablet Place  1 tablet (0.4 mg total) under the tongue every 5 (five) minutes as needed for chest pain. 25 tablet 1   NURTEC 75 MG TBDP Place 75 mg under the tongue daily as needed (migraine).      pantoprazole (PROTONIX) 40 MG tablet TAKE 1 TABLET(40 MG) BY MOUTH DAILY 30 tablet 10   Potassium 99 MG TABS Take 99 mg by mouth daily.      pregabalin (LYRICA) 100 MG capsule Take 100 mg by mouth 3 (three) times daily.     pregabalin (LYRICA) 200 MG capsule Take 200 mg by mouth 3 (three) times daily.     pregabalin (LYRICA) 75 MG capsule Take by mouth.     Probiotic Product (PROBIOTIC DAILY PO) Take 1 tablet by mouth daily.     promethazine (PHENERGAN) 25 MG tablet Take 25 mg by mouth daily.     pyridOXINE (VITAMIN B-6) 100 MG tablet Take 100 mg by mouth daily.      Saw Palmetto 450 MG CAPS Take 900 mg by mouth daily.      senna (SENOKOT) 8.6 MG tablet Take by mouth.     Tiotropium Bromide Monohydrate (SPIRIVA RESPIMAT) 2.5 MCG/ACT AERS Inhale 2 puffs into the lungs daily. 8 g 0   zinc sulfate 220 (50 Zn) MG capsule Take by mouth.     zonisamide (ZONEGRAN) 100 MG capsule Take by mouth.     No current facility-administered medications on file prior to visit.    Family Hx: The patient's family history includes Cancer in his paternal grandmother; Diabetes in his paternal grandmother and sister; Heart attack (age of onset: 66) in his paternal grandfather; Heart attack (age of onset: 73) in his father; Heart attack (age of onset: 63) in his brother; Heart disease in his  father and mother; Hyperlipidemia in his brother and sister; Hypertension in his brother and sister; Supraventricular tachycardia in his brother.  ROS:   Please see the history of present illness.    Review of Systems  Constitutional: Negative.   HENT: Negative.    Respiratory: Negative.    Cardiovascular:  Positive for chest pain.  Gastrointestinal: Negative.   Musculoskeletal: Negative.   Neurological:  Positive for sensory change, weakness and headaches.       Memory deficits  Psychiatric/Behavioral: Negative.    All other systems reviewed and are negative.   Labs/Other Tests and Data Reviewed:    Recent Labs: No results found for requested labs within last 8760 hours.   Recent Lipid Panel Lab Results  Component Value Date/Time   CHOL 140 08/10/2017 09:38 AM   TRIG 65 08/10/2017 09:38 AM   [name] 39 (L) 08/10/2017 09:38 AM   CHOLHDL 3.4 05/01/2016 09:08 AM   LDLCALC 88 08/10/2017 09:38 AM    Wt Readings from Last 3 Encounters:  10/21/21 269 lb (122 kg)  11/11/20 242 lb 9.6 oz (110 kg)  10/18/20 240 lb (108.9 kg)     Exam:    Vital Signs: Vital signs may also be detailed in the HPI BP 134/86 (BP Location: Left Arm, Patient Position: Sitting, Cuff Size: Normal)   Ht 6' (1.829 m)   Wt 269 lb (122 kg)   SpO2 93%   BMI 36.48 kg/m  Constitutional:  oriented to person, place, and time. No distress.  Difficulty ambulating HENT:  Head: Grossly normal Eyes:  no discharge. No scleral icterus.  Neck: No JVD, no carotid bruits  Cardiovascular: Regular rate and rhythm, no murmurs appreciated Pulmonary/Chest: Clear to auscultation  bilaterally, no wheezes or rails Abdominal: Soft.  no distension.  no tenderness.  Musculoskeletal: Normal range of motion Neurological:  normal muscle tone. Coordination normal. No atrophy Mild facial droop, muscle atrophy Skin: Skin warm and dry Psychiatric: normal affect, pleasant  ASSESSMENT & PLAN:    Problem List Items Addressed This  Visit       Cardiology Problems   Hyperlipidemia (Chronic)   [name] (coronary artery disease) - Primary   Relevant Orders   EKG 12-Lead   Benign essential HTN   Relevant Orders   EKG 12-Lead     Other   Chronic obstructive pulmonary disease (HCC)   Relevant Medications   fluticasone (FLONASE) 50 MCG/ACT nasal spray   OSA (obstructive sleep apnea)   Morbid obesity (HCC)   Current tobacco use   [name] with stable angina Currently with no symptoms of angina. No further workup at this time. Continue current medication regimen.  Migraines/memory loss Has had a difficult year with complications, memory loss, debility Ambulates with a walker, history of falls  HTN Blood pressure is well controlled on today's visit. No changes made to the medications.  Morbid obesity Eating more, less active, diet discussed Started participating in PT We have encouraged continued exercise, careful diet management in an effort to lose weight.     Signed, Julien Nordmann, MD  [facility] [address], [place], [place] [address]

## 2021-10-23 ENCOUNTER — Ambulatory Visit: Payer: BC Managed Care – PPO

## 2021-10-27 ENCOUNTER — Ambulatory Visit: Payer: BC Managed Care – PPO

## 2021-11-03 ENCOUNTER — Ambulatory Visit: Payer: BC Managed Care – PPO

## 2021-11-03 ENCOUNTER — Other Ambulatory Visit: Payer: Self-pay

## 2021-11-03 DIAGNOSIS — R262 Difficulty in walking, not elsewhere classified: Secondary | ICD-10-CM | POA: Diagnosis not present

## 2021-11-03 DIAGNOSIS — M6281 Muscle weakness (generalized): Secondary | ICD-10-CM

## 2021-11-03 DIAGNOSIS — R2681 Unsteadiness on feet: Secondary | ICD-10-CM

## 2021-11-03 NOTE — Therapy (Signed)
[place] MAIN [facility] SERVICES [address] [place], [place], [address] Phone: [phone]   Fax:  [phone]  Physical Therapy Treatment  Patient Details  Name: Cory Espinoza MRN: [id number] Date of Birth: 1971/04/19 Referring Provider (PT): Dr. Joselyn Arrow   Encounter Date: 11/03/2021   PT End of Session - 11/03/21 1450     Visit Number 65    Number of Visits 38    Date for PT Re-Evaluation 11/10/21    Authorization Type BCBS PPO;    Authorization Time Period 5/10 PN 10/3    PT Start Time 1517    PT Stop Time 1600    PT Time Calculation (min) 43 min    Equipment Utilized During Treatment Gait belt    Activity Tolerance Patient tolerated treatment well;Treatment limited secondary to medical complications (Comment)   provoked symptoms with certain activities   Behavior During Therapy WFL for tasks assessed/performed             Past Medical History:  Diagnosis Date   Allergy    Anginal pain (HCC)    Anxiety    Arthritis    lumbar spine   Asthma    Atrial fibrillation (HCC)    Back pain    Severe Lumbar Pain   CHF (congestive heart failure) (HCC)    COPD (chronic obstructive pulmonary disease) (HCC)    Restrictive lung disease   Coronary artery disease    Dyspnea    Dysrhythmia    afib   GERD (gastroesophageal reflux disease)    Headache    [name], onset October 2020, cluster headaches in the past   History of kidney stones    Hyperlipidemia    Hypertension    Myocardial infarction ([place])    at age 44   Pneumonia    Restrictive lung disease    Sleep apnea    unable to use cpap since onset of migraines    Past Surgical History:  Procedure Laterality Date   ABDOMINAL EXPOSURE N/A 03/25/2020   Procedure: ABDOMINAL EXPOSURE;  Surgeon: Rosetta Posner, MD;  Location: [facility] OR;  Service: Vascular;  Laterality: N/A;   ANTERIOR CERVICAL DECOMP/DISCECTOMY FUSION N/A 01/23/2020   Procedure: ANTERIOR CERVICAL  DECOMPRESSION FUSION - CERVICAL SIX-CERVICAL SEVEN;  Surgeon: Earnie Larsson, MD;  Location: [place];  Service: Neurosurgery;  Laterality: N/A;   ANTERIOR LUMBAR FUSION N/A 03/25/2020   Procedure: ANTERIOR LUMBAR INTERBODY FUSION LUMBAR FIVE-SACRAL ONE.;  Surgeon: Earnie Larsson, MD;  Location: [place];  Service: Neurosurgery;  Laterality: N/A;  anterior   BACK SURGERY  1996   CARDIAC CATHETERIZATION Left 04/27/2016   Procedure: Left Heart Cath and Coronary Angiography;  Surgeon: Corey Skains, MD;  Location: [place] [facility];  Service: Cardiovascular;  Laterality: Left;   CARDIAC CATHETERIZATION N/A 04/27/2016   Procedure: Intravascular Pressure Wire/FFR Study;  Surgeon: Yolonda Kida, MD;  Location: [place] [facility];  Service: Cardiovascular;  Laterality: N/A;   CATHETERIZATION OF PULMONARY ARTERY WITH RETRIEVAL OF FOREIGN BODY Bilateral 04/07/2011   heart   DEGENERATIVE SPONDYLOLISTHESIS     KNEE ARTHROSCOPY Bilateral 2004   LUMBAR DISC SURGERY  1999   RADICULOPATHY, CERVICAL REGION     TONSILLECTOMY      There were no vitals filed for this visit.   Subjective Assessment - 11/03/21 1449     Subjective Patient returning after additional 2 week absence from past month absence due to illness.    Pertinent History  Pt has a complex medical history. 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 He states that he fell 10-12 feet and struck his head, neck, and back. He was able to finish the course which took him approximately 45 minutes more to complete. He does endorse a second fall while finishing the course but did not suffer a second head injury. He states that he started getting headaches that day which have persisted since that time. 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 He does report improvement in his headaches recently. Neurology was concerned about possible BPPV and have  referred him for a vestibular evaluation. In addition since the injury, pt developed RUE and RLE pain and weakness. Pt states that NCV showed abnormal nerve conduction in RUE. He has since undergone a C7-7 ACDF on 01/23/20. He reports initial improvement in RUE strength, but states that he has a gradual return of his RUE weakness. He was also having significant RLE weakness and pain and underwent and L5-S1 anterior lumbar interbody fusion on 03/25/20. Patient reports that he does not have any back precautions but needs to wear the low back brace for one more month. Patient reports he has a follow-up appointment with the surgeon next month. Patient reports he has a follow-up appointment with Dr. Brigitte Pulse, neurologist, in August. He arrives to therapy ambulating with an upright rollator walker. Pt denies any mention of concussion after his injury. He has been having cognitive issues since his injury and has previously had a heavy metals screen as well as neurocognitive testing. He has had an MRI of his brain with and without contrast on 12/18/19. Results were mildly motion degraded examination. No evidence of acute intracranial abnormality. Minimal chronic small vessel ischemic disease. He has tremor in both his hands. He has a positive family history of Parkinson's disease in his grandfather. He is complaining of constant dizziness and unsteadiness which are worse with activity. Patient states that he frequently loses his balance and his wife has to steady him.    Limitations Lifting;Standing;Walking;House hold activities    Diagnostic tests He has had an MRI of his brain with and without contrast on 12/18/19. Results were mildly motion degraded examination. No evidence of acute intracranial abnormality. Minimal chronic small vessel ischemic disease.    Patient Stated Goals to be able to ambulate with a cane or no AD, to be able to drive, to be able to ride his motorcycle    Currently in Pain? Yes    Pain Score 6      Pain Location Head    Pain Orientation Posterior    Pain Descriptors / Indicators Aching    Pain Type Chronic pain    Pain Onset More than a month ago    Pain Frequency Intermittent                    TUG: 16 seconds BERG-perform next session       Treatment:    in room with lights off and noise damped due to migraine.    4lb ankle weight: -LAQ 10x each LE -march to PT hand 15x each LE - straight leg IR/ER 10x each LE  -on arm bicep curl 12x each UE  BTB -adduction 15x each LE -hamstring curl 15x each LE  Standing: Static stand stare at sticky note on wall 62 seconds first trial in  standard stance -second trial with narrowed BOS x 38 seconds very challenging frequent near LOB    Pt educated throughout session about proper posture and technique with exercises. Improved exercise technique, movement at target joints, use of target muscles after min to mod verbal, visual, tactile cues.   Patient tolerated progressive strengthening and TUG performance with only mild nausea and headache increase. He does have episodic confusion after TUG due to nature of demand of turning but improved upon returning to a dark room with noise dampened. Patient TUG improved compared to previous sessions. If symptoms allow will test the BERG next session for recert. The patient continues to benefit from additional skilled PT services to improve LE strength and balance for decreased fall risk and improved quality of life                PT Education - 11/03/21 1449     Education Details exercise technique, body mechanics    Person(s) Educated Patient    Methods Explanation;Demonstration;Tactile cues;Verbal cues    Comprehension Verbalized understanding;Returned demonstration;Verbal cues required;Tactile cues required              PT Short Term Goals - 10/21/21 0700       PT SHORT TERM GOAL #1   Title Patient will be independent in home exercise program to improve  strength/mobility for better functional independence with ADLs and for self-management.    Baseline 3/3 HEP compliant 3/31 hep compliant    Time 6    Period Weeks    Status Achieved    Target Date 02/20/21      PT SHORT TERM GOAL #2   Title Patient will be modified independent in walking on even/uneven surface with least restrictive assistive device, for 10+ minutes without rest break, reporting some difficulty or less to improve walking tolerance with community ambulation including grocery shopping, going to church,etc.    Baseline 02/03: instability, LOB multiple times while ambulating to elevator 3/31: unable to test due to throwing up 6/20: 6 minutes 7/12: unable to test due to migraine 9/12: defer to next session 11/15: unable to test due to migraine    Time 6    Period Weeks    Status Partially Met    Target Date 11/10/21      PT SHORT TERM GOAL #3   Title Patient will increase ABC scale score >80% to demonstrate better functional mobility and better confidence with ADLs.    Baseline 01/09/21: 60.625% 3/3/: 50% 3/31: 63% 6/20: 45% 7/25: 40% 9/12: 51.1% 11/15: 48%    Time 6    Period Weeks    Status Partially Met    Target Date 11/10/21               PT Long Term Goals - 10/21/21 0701       PT LONG TERM GOAL #1   Title Patient will increase FOTO score to equal to or greater than 65 to demonstrate statistically significant improvement in mobility and quality of life.    Baseline scored 41/100 on 05/21/2020; 7/22 40, 8/23: 60/100, 09/30/20=40, 01/09/21: 46 3/3: 40.3% 3/31: 49% 6/20: 45% 7/12: 50.7% 9/12: 44.7% 11/15: 40%    Time 12    Period Weeks    Status Partially Met    Target Date 11/10/21      PT LONG TERM GOAL #2   Title Patient will reduce falls risk as indicated by decreased TUG time to less than 11 seconds.    Baseline scored  37.9 sec with TUG on 05/21/2020; 21seconds with elevated rollator 7/22, 8/23: 13.62 sec with up and go walker, 01/09/21: 15.81s with hiking  stick 3/3: 17.98 seconds one near LOB 3/31: 15.57 seconds with walking stick 6/20: 14.8 seconds with walking stick. 7/25: 19.72 seconds with walking stick. 9/26: 17.6 seconds 11/15: unable to test due to migraine    Time 12    Period Weeks    Status Partially Met    Target Date 11/10/21      PT LONG TERM GOAL #3   Title Patient will increase right UE and LE gross strength to 4+/5 throughout to improve functional strength for independent gait, increased standing tolerance and increased ADL ability.    Baseline grossly +3/5 to -4/5 right UE and LE strength on 05/21/2020; grossly 4-/5 for RUE, grossly 4-/5 for RLE on 7/22, grossly 4-/5 RUE, grossly 4/5 RLE 3/3: see note    Time 12    Period Weeks    Status Partially Met    Target Date 11/10/21      PT LONG TERM GOAL #4   Title Patient will increase 10 meter walk test to >1.36ms as to improve gait speed for better community ambulation and to reduce fall risk.    Baseline on 7/22 .57 m/sself selected, fast  .73 m/s with LOB pt reported R knee buckling, elevated rollator used, 8/23: 1.06 m/s with up and go walker, 01/09/21: 0.72 m/s with hiking stick 3/3: 0.74 m/s with walking stick 3/31: 0.81 m/s w walking stick 6/20: 1.1 m/s with walking stick    Time 12    Period Weeks    Status Achieved      PT LONG TERM GOAL #5   Title Patient will increase Berg Balance score by > 6 points to demonstrate decreased fall risk during functional activities.    Baseline 8/23: 37/56, 09/30/20=40/56, 12/20: 42/56 3/3: deferred 3/31 will perform next time; deferred due to throwing up 6/20: 46/56 7/12 38/56    Time 8    Period Weeks    Status Achieved      PT LONG TERM GOAL #6   Title Patient (< 660years old) will complete five times sit to stand test in < 10 seconds indicating an increased LE strength and improved balance.    Baseline 8/23: 22 sec, 09/30/20=13.04 sec, 12/20: 27.05 sec, 01/09/21: 18.01s 3/3: 23 seconds no UE support 3/31; deferred due to patient  throwing up 7/12: 15.2 seconds 11/15: 26 seconds    Time 12    Period Weeks    Status Partially Met    Target Date 11/10/21      PT LONG TERM GOAL #7   Title Patient will improve 6 min walk test >1000 feet with LRAD for improved gait ability in community.    Baseline 8/23: not tested; 8/30 845, goal adjusted >20034f 09/30/20=600 feet - stopped test due to R ankle catching making gait unsafe, 11/25/20= 330 feet - stopped test due to severe dizziness, vomiting/nausea, sweating making gait unsafe; 01/09/21: 720 ft with no rest breaks 3/3: deferred due to nausea 3/31: deferred due to patient throwing up 6/20: 640 ft 7/12: [id number] walking stick, no rest breaks; 9/26: 1000 ft    Time 12    Period Weeks    Status Achieved      PT LONG TERM GOAL #8   Title Patient will increase Berg Balance score by > 6 points  (52/56) to demonstrate decreased fall risk during functional activities.  Baseline 6/20: 46/56 7/12: 38/56 with migraine 9/12: defer to next session 11/15:  unable to test due to migraine    Time 12    Period Weeks    Status On-going    Target Date 11/10/21                   Plan - 11/03/21 1653     Clinical Impression Statement Patient tolerated progressive strengthening and TUG performance with only mild nausea and headache increase. He does have episodic confusion after TUG due to nature of demand of turning but improved upon returning to a dark room with noise dampened. Patient TUG improved compared to previous sessions. If symptoms allow will test the BERG next session for recert. The patient continues to benefit from additional skilled PT services to improve LE strength and balance for decreased fall risk and improved quality of life    Personal Factors and Comorbidities Comorbidity 3+;Time since onset of injury/illness/exacerbation    Comorbidities anxiety, back pain, COPD, headache, HTN, MI, sleep apnea, migraines    Examination-Activity Limitations Locomotion  Level;Transfers;Bed Mobility;Stand;Stairs;Sleep;Squat;Bend    Examination-Participation Restrictions Driving;Medication Management    Stability/Clinical Decision Making Unstable/Unpredictable    Rehab Potential Fair    PT Duration 12 weeks    PT Treatment/Interventions ADLs/Self Care Home Management;Canalith Repostioning;Moist Heat;Electrical Stimulation;Gait training;Stair training;Functional mobility training;Therapeutic activities;Therapeutic exercise;Balance training;Neuromuscular re-education;Patient/family education;Manual techniques;Passive range of motion;Vestibular    PT Next Visit Plan Continue with functional mobility training and strengthening;    PT Home Exercise Plan Medbridge Access Code: [email or web address]    Consulted and Agree with Plan of Care Patient;Family member/caregiver    Family Member Consulted Wife             Patient will benefit from skilled therapeutic intervention in order to improve the following deficits and impairments:  Decreased activity tolerance, Decreased balance, Decreased cognition, Decreased mobility, Difficulty walking, Abnormal gait, Decreased range of motion, Dizziness, Pain, Impaired flexibility, Decreased strength, Impaired sensation, Decreased endurance, Decreased safety awareness  Visit Diagnosis: Difficulty in walking, not elsewhere classified  Muscle weakness (generalized)  Unsteadiness on feet     Problem List Patient Active Problem List   Diagnosis Date Noted   [name] (coronary artery disease) 10/21/2021   Degenerative spondylolisthesis 03/25/2020   Physical deconditioning 03/04/2020   Lobar pneumonia, unspecified organism ([employer]) 03/04/2020   Intractable hemiplegic migraine with status migrainosus 03/02/2020   Current tobacco use 02/05/2020   Abnormal findings on diagnostic imaging of lung 02/05/2020   Shortness of breath 02/05/2020   Herniated disc, cervical 01/23/2020   Cervical spondylosis with myelopathy and radiculopathy 01/23/2020    Pre-operative respiratory examination 01/12/2020   Weakness on right side of face 12/22/2019   Osteoarthritis of spine with radiculopathy, lumbar region 12/11/2019   Intractable migraine with aura with status migrainosus 11/09/2019   Former smoker 08/27/2018   Benign essential HTN 08/27/2018   Cough productive of purulent sputum 11/08/2017   GERD (gastroesophageal reflux disease) 05/06/2017   Contusion of hand 04/22/2017   BP (high blood pressure) 04/22/2017   Oxygen desaturation 04/22/2017   Prostate lump 04/22/2017   Unstable angina ([place]) 04/27/2016   Chest pain 04/24/2016   Morbid obesity ([place]) 01/29/2016   Elevated ALT measurement 10/30/2015   Nonspecific elevation of levels of transaminase and lactic acid dehydrogenase (ldh) 10/30/2015   Reduced libido 10/29/2015   Hyperlipidemia 08/06/2015   Anxiety 08/06/2015   Atherosclerosis of coronary artery 08/06/2015   Childhood asthma 08/06/2015   Chronic obstructive pulmonary disease ([place])  08/06/2015   OSA (obstructive sleep apnea) 08/06/2015   Anxiety disorder 08/06/2015   Atherosclerosis of native coronary artery of native heart with stable angina pectoris ([place]) 72/06/2181   Uncomplicated asthma 88/33/7445   Janna Arch, PT, DPT  11/03/2021, 4:54 PM  [place] MAIN [facility] SERVICES [address] [place], [place], [address] Phone: [phone]   Fax:  [phone]  Name: Cory Espinoza MRN: [id number] Date of Birth: 09/01/1971

## 2021-11-06 ENCOUNTER — Other Ambulatory Visit: Payer: Self-pay

## 2021-11-06 ENCOUNTER — Ambulatory Visit: Payer: BC Managed Care – PPO | Attending: Neurology

## 2021-11-06 DIAGNOSIS — R262 Difficulty in walking, not elsewhere classified: Secondary | ICD-10-CM | POA: Diagnosis not present

## 2021-11-06 DIAGNOSIS — R2681 Unsteadiness on feet: Secondary | ICD-10-CM | POA: Diagnosis present

## 2021-11-06 DIAGNOSIS — M6281 Muscle weakness (generalized): Secondary | ICD-10-CM | POA: Diagnosis present

## 2021-11-06 NOTE — Therapy (Signed)
[place] [facility] SERVICES [address] [place], [place], [address] Phone: [phone]   Fax:  [phone]  Physical Therapy Treatment  Patient Details  Name: Cory Espinoza MRN: [id number] Date of Birth: 27-Aug-1971 Referring Provider (PT): Dr. Joselyn Arrow   Encounter Date: 11/06/2021   PT End of Session - 11/06/21 1619     Visit Number 75    Number of Visits 90    Date for PT Re-Evaluation 01/29/22    Authorization Type [employer] PPO;    Authorization Time Period 6/10 PN 10/3    PT Start Time 1514    PT Stop Time 1600    PT Time Calculation (min) 46 min    Equipment Utilized During Treatment Gait belt    Activity Tolerance Patient tolerated treatment well;Treatment limited secondary to medical complications (Comment)   provoked symptoms with certain activities   Behavior During Therapy WFL for tasks assessed/performed             Past Medical History:  Diagnosis Date   Allergy    Anginal pain (HCC)    Anxiety    Arthritis    lumbar spine   Asthma    Atrial fibrillation (HCC)    Back pain    Severe Lumbar Pain   CHF (congestive heart failure) (HCC)    COPD (chronic obstructive pulmonary disease) (HCC)    Restrictive lung disease   Coronary artery disease    Dyspnea    Dysrhythmia    afib   GERD (gastroesophageal reflux disease)    Headache    [name], onset October 2020, cluster headaches in the past   History of kidney stones    Hyperlipidemia    Hypertension    Myocardial infarction ([place])    at age 31   Pneumonia    Restrictive lung disease    Sleep apnea    unable to use cpap since onset of migraines    Past Surgical History:  Procedure Laterality Date   ABDOMINAL EXPOSURE N/A 03/25/2020   Procedure: ABDOMINAL EXPOSURE;  Surgeon: Rosetta Posner, MD;  Location: [facility] OR;  Service: Vascular;  Laterality: N/A;   ANTERIOR CERVICAL DECOMP/DISCECTOMY FUSION N/A 01/23/2020   Procedure: ANTERIOR CERVICAL  DECOMPRESSION FUSION - CERVICAL SIX-CERVICAL SEVEN;  Surgeon: Earnie Larsson, MD;  Location: [place];  Service: Neurosurgery;  Laterality: N/A;   ANTERIOR LUMBAR FUSION N/A 03/25/2020   Procedure: ANTERIOR LUMBAR INTERBODY FUSION LUMBAR FIVE-SACRAL ONE.;  Surgeon: Earnie Larsson, MD;  Location: [place];  Service: Neurosurgery;  Laterality: N/A;  anterior   BACK SURGERY  1996   CARDIAC CATHETERIZATION Left 04/27/2016   Procedure: Left Heart Cath and Coronary Angiography;  Surgeon: Corey Skains, MD;  Location: [place] CV LAB;  Service: Cardiovascular;  Laterality: Left;   CARDIAC CATHETERIZATION N/A 04/27/2016   Procedure: Intravascular Pressure Wire/FFR Study;  Surgeon: Yolonda Kida, MD;  Location: [place] [facility] LAB;  Service: Cardiovascular;  Laterality: N/A;   CATHETERIZATION OF PULMONARY ARTERY WITH RETRIEVAL OF FOREIGN BODY Bilateral 04/07/2011   heart   DEGENERATIVE SPONDYLOLISTHESIS     KNEE ARTHROSCOPY Bilateral 2004   LUMBAR DISC SURGERY  1999   RADICULOPATHY, CERVICAL REGION     TONSILLECTOMY      There were no vitals filed for this visit.               Treatment:    in room with lights off and noise damped due to migraine.  4lb ankle weight: -LAQ 12x each LE -march to PT hand 15x each LE - straight leg IR/ER 10x each LE  Hedgehog alternating toe taps 12x each LE   BTB -adduction 15x each LE -abduction 20x    Standing: Static stand stare at sticky note on wall 62 seconds first trial in standard stance Lateral step out 10x each LE, SUE support    Ambulate 450 ft to elevator and upstairs to car. Car transfer with min A for RLE provided.   Pt educated throughout session about proper posture and technique with exercises. Improved exercise technique, movement at target joints, use of target muscles after min to mod verbal, visual, tactile cues.    Patient's goals have been performed throughout the past few sessions (primarily on 11/14 and 11/28)  due to  recent regression from severe illness. Patient has returned from over a month's absence and during his absence from therapy has had a decline in function indicating the positive affect of physical therapy and his mobility. Patient's migraine increased to 9/10 during session requiring assistance to return to car. Patient has significant need for therapy as can be seen in decline in absence from therapy. The patient continues to benefit from additional skilled PT services to improve LE strength and balance for decreased fall risk and improved quality of life                  PT Education - 11/06/21 1513     Education Details exercise technique, body mechanics, recert    Person(s) Educated Patient    Methods Explanation;Demonstration;Tactile cues;Verbal cues    Comprehension Verbalized understanding;Returned demonstration;Verbal cues required;Tactile cues required              PT Short Term Goals - 11/06/21 1621       PT SHORT TERM GOAL #1   Title Patient will be independent in home exercise program to improve strength/mobility for better functional independence with ADLs and for self-management.    Baseline 3/3 HEP compliant 3/31 hep compliant    Time 6    Period Weeks    Status Achieved    Target Date 02/20/21      PT SHORT TERM GOAL #2   Title Patient will be modified independent in walking on even/uneven surface with least restrictive assistive device, for 10+ minutes without rest break, reporting some difficulty or less to improve walking tolerance with community ambulation including grocery shopping, going to church,etc.    Baseline 02/03: instability, LOB multiple times while ambulating to elevator 3/31: unable to test due to throwing up 6/20: 6 minutes 7/12: unable to test due to migraine 9/12: defer to next session 11/15: unable to test due to migraine    Time 6    Period Weeks    Status Partially Met    Target Date 12/18/21      PT SHORT TERM GOAL #3   Title  Patient will increase ABC scale score >80% to demonstrate better functional mobility and better confidence with ADLs.    Baseline 01/09/21: 60.625% 3/3/: 50% 3/31: 63% 6/20: 45% 7/25: 40% 9/12: 51.1% 11/15: 48%    Time 6    Period Weeks    Status Partially Met    Target Date 12/18/21               PT Long Term Goals - 11/06/21 1621       PT LONG TERM GOAL #1   Title Patient will increase FOTO score to equal  to or greater than 65 to demonstrate statistically significant improvement in mobility and quality of life.    Baseline scored 41/100 on 05/21/2020; 7/22 40, 8/23: 60/100, 09/30/20=40, 01/09/21: 46 3/3: 40.3% 3/31: 49% 6/20: 45% 7/12: 50.7% 9/12: 44.7% 11/15: 40%    Time 12    Period Weeks    Status Partially Met    Target Date 01/29/22      PT LONG TERM GOAL #2   Title Patient will reduce falls risk as indicated by decreased TUG time to less than 11 seconds.    Baseline scored 37.9 sec with TUG on 05/21/2020; 21seconds with elevated rollator 7/22, 8/23: 13.62 sec with up and go walker, 01/09/21: 15.81s with hiking stick 3/3: 17.98 seconds one near LOB 3/31: 15.57 seconds with walking stick 6/20: 14.8 seconds with walking stick. 7/25: 19.72 seconds with walking stick. 9/26: 17.6 seconds 11/15: unable to test due to migraine    Time 12    Period Weeks    Status Partially Met    Target Date 01/29/22      PT LONG TERM GOAL #3   Title Patient will increase right UE and LE gross strength to 4+/5 throughout to improve functional strength for independent gait, increased standing tolerance and increased ADL ability.    Baseline grossly +3/5 to -4/5 right UE and LE strength on 05/21/2020; grossly 4-/5 for RUE, grossly 4-/5 for RLE on 7/22, grossly 4-/5 RUE, grossly 4/5 RLE 3/3: see note    Time 12    Period Weeks    Status Partially Met    Target Date 01/29/22      PT LONG TERM GOAL #4   Title Patient will increase 10 meter walk test to >1.47m/s as to improve gait speed for better  community ambulation and to reduce fall risk.    Baseline on 7/22 .57 m/sself selected, fast  .73 m/s with LOB pt reported R knee buckling, elevated rollator used, 8/23: 1.06 m/s with up and go walker, 01/09/21: 0.72 m/s with hiking stick 3/3: 0.74 m/s with walking stick 3/31: 0.81 m/s w walking stick 6/20: 1.1 m/s with walking stick    Time 12    Period Weeks    Status Achieved      PT LONG TERM GOAL #5   Title Patient will increase Berg Balance score by > 6 points to demonstrate decreased fall risk during functional activities.    Baseline 8/23: 37/56, 09/30/20=40/56, 12/20: 42/56 3/3: deferred 3/31 will perform next time; deferred due to throwing up 6/20: 46/56 7/12 38/56    Time 8    Period Weeks    Status Achieved      Additional Long Term Goals   Additional Long Term Goals Yes      PT LONG TERM GOAL #6   Title Patient (< 12 years old) will complete five times sit to stand test in < 10 seconds indicating an increased LE strength and improved balance.    Baseline 8/23: 22 sec, 09/30/20=13.04 sec, 12/20: 27.05 sec, 01/09/21: 18.01s 3/3: 23 seconds no UE support 3/31; deferred due to patient throwing up 7/12: 15.2 seconds 11/15: 26 seconds    Time 12    Period Weeks    Status Partially Met    Target Date 01/29/22      PT LONG TERM GOAL #7   Title Patient will improve 6 min walk test >1000 feet with LRAD for improved gait ability in community.    Baseline 8/23: not tested; 8/30 845,  goal adjusted >2072f, 09/30/20=600 feet - stopped test due to R ankle catching making gait unsafe, 11/25/20= 330 feet - stopped test due to severe dizziness, vomiting/nausea, sweating making gait unsafe; 01/09/21: 720 ft with no rest breaks 3/3: deferred due to nausea 3/31: deferred due to patient throwing up 6/20: 640 ft 7/12: [id number] walking stick, no rest breaks; 9/26: 1000 ft    Time 12    Period Weeks    Status Achieved      PT LONG TERM GOAL #8   Title Patient will increase Berg Balance score by  > 6 points  (52/56) to demonstrate decreased fall risk during functional activities.    Baseline 6/20: 46/56 7/12: 38/56 with migraine 9/12: defer to next session 11/15:  unable to test due to migraine    Time 12    Period Weeks    Status On-going    Target Date 01/29/22      PT LONG TERM GOAL  #9   TITLE Patient will report decreased frequency of falling per week to 1x/week for decreased fall risk and improved quality of life    Baseline 12/1: 4 falls minimum a week    Time 12    Period Weeks    Status New    Target Date 01/29/22                   Plan - 11/06/21 1620     Clinical Impression Statement Patient's goals have been performed throughout the past few sessions (primarily on 11/14 and 11/28)  due to recent regression from severe illness. Patient has returned from over a month's absence and during his absence from therapy has had a decline in function indicating the positive affect of physical therapy and his mobility. Patient's migraine increased to 9/10 during session requiring assistance to return to car. Patient has significant need for therapy as can be seen in decline in absence from therapy. The patient continues to benefit from additional skilled PT services to improve LE strength and balance for decreased fall risk and improved quality of life    Personal Factors and Comorbidities Comorbidity 3+;Time since onset of injury/illness/exacerbation    Comorbidities anxiety, back pain, COPD, headache, HTN, MI, sleep apnea, migraines    Examination-Activity Limitations Locomotion Level;Transfers;Bed Mobility;Stand;Stairs;Sleep;Squat;Bend    Examination-Participation Restrictions Driving;Medication Management    Stability/Clinical Decision Making Unstable/Unpredictable    Rehab Potential Fair    PT Frequency 2x / week    PT Duration 12 weeks    PT Treatment/Interventions ADLs/Self Care Home Management;Canalith Repostioning;Moist Heat;Electrical Stimulation;Gait  training;Stair training;Functional mobility training;Therapeutic activities;Therapeutic exercise;Balance training;Neuromuscular re-education;Patient/family education;Manual techniques;Passive range of motion;Vestibular    PT Next Visit Plan Continue with functional mobility training and strengthening;    PT Home Exercise Plan Medbridge Access Code: [email or web address]    Consulted and Agree with Plan of Care Patient;Family member/caregiver    Family Member Consulted Wife             Patient will benefit from skilled therapeutic intervention in order to improve the following deficits and impairments:  Decreased activity tolerance, Decreased balance, Decreased cognition, Decreased mobility, Difficulty walking, Abnormal gait, Decreased range of motion, Dizziness, Pain, Impaired flexibility, Decreased strength, Impaired sensation, Decreased endurance, Decreased safety awareness, Postural dysfunction, Improper body mechanics, Impaired UE functional use, Impaired vision/preception  Visit Diagnosis: Difficulty in walking, not elsewhere classified  Muscle weakness (generalized)  Unsteadiness on feet     Problem List Patient Active Problem List   Diagnosis Date Noted  [name] (coronary artery disease) 10/21/2021   Degenerative spondylolisthesis 03/25/2020   Physical deconditioning 03/04/2020   Lobar pneumonia, unspecified organism ([place]) 03/04/2020   Intractable hemiplegic migraine with status migrainosus 03/02/2020   Current tobacco use 02/05/2020   Abnormal findings on diagnostic imaging of lung 02/05/2020   Shortness of breath 02/05/2020   Herniated disc, cervical 01/23/2020   Cervical spondylosis with myelopathy and radiculopathy 01/23/2020   Pre-operative respiratory examination 01/12/2020   Weakness on right side of face 12/22/2019   Osteoarthritis of spine with radiculopathy, lumbar region 12/11/2019   Intractable migraine with aura with status migrainosus 11/09/2019   Former smoker  08/27/2018   Benign essential HTN 08/27/2018   Cough productive of purulent sputum 11/08/2017   GERD (gastroesophageal reflux disease) 05/06/2017   Contusion of hand 04/22/2017   BP (high blood pressure) 04/22/2017   Oxygen desaturation 04/22/2017   Prostate lump 04/22/2017   Unstable angina ([place]) 04/27/2016   Chest pain 04/24/2016   Morbid obesity ([place]) 01/29/2016   Elevated ALT measurement 10/30/2015   Nonspecific elevation of levels of transaminase and lactic acid dehydrogenase (ldh) 10/30/2015   Reduced libido 10/29/2015   Hyperlipidemia 08/06/2015   Anxiety 08/06/2015   Atherosclerosis of coronary artery 08/06/2015   Childhood asthma 08/06/2015   Chronic obstructive pulmonary disease ([place]) 08/06/2015   OSA (obstructive sleep apnea) 08/06/2015   Anxiety disorder 08/06/2015   Atherosclerosis of native coronary artery of native heart with stable angina pectoris ([place]) 64/35/3912   Uncomplicated asthma 25/83/4621    Janna Arch, PT, DPT  11/06/2021, 4:24 PM  [place] MAIN [facility] SERVICES [address] [place], [place], [address] Phone: [phone]   Fax:  [phone]  Name: GUTHRIE LEMME MRN: [id number] Date of Birth: 1971-09-10

## 2021-11-10 ENCOUNTER — Ambulatory Visit: Payer: BC Managed Care – PPO

## 2021-11-13 ENCOUNTER — Ambulatory Visit: Payer: BC Managed Care – PPO

## 2021-11-17 ENCOUNTER — Other Ambulatory Visit: Payer: Self-pay

## 2021-11-17 ENCOUNTER — Other Ambulatory Visit (HOSPITAL_COMMUNITY): Payer: Self-pay | Admitting: Neurology

## 2021-11-17 ENCOUNTER — Other Ambulatory Visit: Payer: Self-pay | Admitting: Neurology

## 2021-11-17 ENCOUNTER — Ambulatory Visit: Payer: BC Managed Care – PPO

## 2021-11-17 DIAGNOSIS — M5412 Radiculopathy, cervical region: Secondary | ICD-10-CM

## 2021-11-17 DIAGNOSIS — M6281 Muscle weakness (generalized): Secondary | ICD-10-CM

## 2021-11-17 DIAGNOSIS — R262 Difficulty in walking, not elsewhere classified: Secondary | ICD-10-CM

## 2021-11-17 DIAGNOSIS — R2681 Unsteadiness on feet: Secondary | ICD-10-CM

## 2021-11-17 NOTE — Therapy (Signed)
[place] MAIN [facility] SERVICES [address] [place], [place], [address] Phone: [phone]   Fax:  [phone]  Physical Therapy Treatment  Patient Details  Name: Cory Espinoza MRN: [id number] Date of Birth: August 26, 1971 Referring Provider (PT): Dr. Joselyn Arrow   Encounter Date: 11/17/2021   PT End of Session - 11/17/21 1452     Visit Number 9    Number of Visits 90    Date for PT Re-Evaluation 01/29/22    Authorization Type BCBS PPO;    Authorization Time Period 7/10 PN 10/3    PT Start Time 1500    PT Stop Time 1545    PT Time Calculation (min) 45 min    Equipment Utilized During Treatment Gait belt    Activity Tolerance Patient tolerated treatment well;Treatment limited secondary to medical complications (Comment)   provoked symptoms with certain activities   Behavior During Therapy WFL for tasks assessed/performed             Past Medical History:  Diagnosis Date   Allergy    Anginal pain (HCC)    Anxiety    Arthritis    lumbar spine   Asthma    Atrial fibrillation (HCC)    Back pain    Severe Lumbar Pain   CHF (congestive heart failure) (HCC)    COPD (chronic obstructive pulmonary disease) (HCC)    Restrictive lung disease   Coronary artery disease    Dyspnea    Dysrhythmia    afib   GERD (gastroesophageal reflux disease)    Headache    [name], onset October 2020, cluster headaches in the past   History of kidney stones    Hyperlipidemia    Hypertension    Myocardial infarction ([place])    at age 76   Pneumonia    Restrictive lung disease    Sleep apnea    unable to use cpap since onset of migraines    Past Surgical History:  Procedure Laterality Date   ABDOMINAL EXPOSURE N/A 03/25/2020   Procedure: ABDOMINAL EXPOSURE;  Surgeon: Rosetta Posner, MD;  Location: [facility] OR;  Service: Vascular;  Laterality: N/A;   ANTERIOR CERVICAL DECOMP/DISCECTOMY FUSION N/A 01/23/2020   Procedure: ANTERIOR CERVICAL  DECOMPRESSION FUSION - CERVICAL SIX-CERVICAL SEVEN;  Surgeon: Earnie Larsson, MD;  Location: [place];  Service: Neurosurgery;  Laterality: N/A;   ANTERIOR LUMBAR FUSION N/A 03/25/2020   Procedure: ANTERIOR LUMBAR INTERBODY FUSION LUMBAR FIVE-SACRAL ONE.;  Surgeon: Earnie Larsson, MD;  Location: [place];  Service: Neurosurgery;  Laterality: N/A;  anterior   BACK SURGERY  1996   CARDIAC CATHETERIZATION Left 04/27/2016   Procedure: Left Heart Cath and Coronary Angiography;  Surgeon: Corey Skains, MD;  Location: [facility];  Service: Cardiovascular;  Laterality: Left;   CARDIAC CATHETERIZATION N/A 04/27/2016   Procedure: Intravascular Pressure Wire/FFR Study;  Surgeon: Yolonda Kida, MD;  Location: [place] [facility];  Service: Cardiovascular;  Laterality: N/A;   CATHETERIZATION OF PULMONARY ARTERY WITH RETRIEVAL OF FOREIGN BODY Bilateral 04/07/2011   heart   DEGENERATIVE SPONDYLOLISTHESIS     KNEE ARTHROSCOPY Bilateral 2004   LUMBAR DISC SURGERY  1999   RADICULOPATHY, CERVICAL REGION     TONSILLECTOMY      There were no vitals filed for this visit.   Subjective Assessment - 11/17/21 1631     Subjective Patient fell and his his head earlier today. Was riding his bike and walked away from it, got up  and got moving too quickly and hit bedroom door. Patient is returning to therapy after 2 weeks absence.    Pertinent History Pt has a complex medical history. 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 He states that he fell 10-12 feet and struck his head, neck, and back. He was able to finish the course which took him approximately 45 minutes more to complete. He does endorse a second fall while finishing the course but did not suffer a second head injury. He states that he started getting headaches that day which have persisted since that time. 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  He does report improvement in his headaches recently. Neurology was concerned about possible BPPV and have referred him for a vestibular evaluation. In addition since the injury, pt developed RUE and RLE pain and weakness. Pt states that NCV showed abnormal nerve conduction in RUE. He has since undergone a C7-7 ACDF on 01/23/20. He reports initial improvement in RUE strength, but states that he has a gradual return of his RUE weakness. He was also having significant RLE weakness and pain and underwent and L5-S1 anterior lumbar interbody fusion on 03/25/20. Patient reports that he does not have any back precautions but needs to wear the low back brace for one more month. Patient reports he has a follow-up appointment with the surgeon next month. Patient reports he has a follow-up appointment with Dr. Brigitte Pulse, neurologist, in August. He arrives to therapy ambulating with an upright rollator walker. Pt denies any mention of concussion after his injury. He has been having cognitive issues since his injury and has previously had a heavy metals screen as well as neurocognitive testing. He has had an MRI of his brain with and without contrast on 12/18/19. Results were mildly motion degraded examination. No evidence of acute intracranial abnormality. Minimal chronic small vessel ischemic disease. He has tremor in both his hands. He has a positive family history of Parkinson's disease in his grandfather. He is complaining of constant dizziness and unsteadiness which are worse with activity. Patient states that he frequently loses his balance and his wife has to steady him.    Limitations Lifting;Standing;Walking;House hold activities    Diagnostic tests He has had an MRI of his brain with and without contrast on 12/18/19. Results were mildly motion degraded examination. No evidence of acute intracranial abnormality. Minimal chronic small vessel ischemic disease.    Patient Stated Goals to be able to ambulate with a cane or no  AD, to be able to drive, to be able to ride his motorcycle    Currently in Pain? Yes    Pain Score 5     Pain Location Head    Pain Orientation Posterior    Pain Descriptors / Indicators Aching    Pain Type Chronic pain    Pain Onset More than a month ago    Pain Frequency Intermittent               Patient fell and his his head earlier today. Was riding his bike and walked away from it, got up and got moving too quickly and hit bedroom door. Patient is returning to therapy after 2 weeks absence.         Treatment:    in room with lights off and noise damped due to migraine.    4lb ankle weight: -LAQ  12x each LE -march to PT hand 15x each LE  5lb dumbell seated: -curls 12x with single dumbbell -bilateral dumbbell chest press 12x    seated: STM to cervical paraspinals and upper traps with implementation of effleurage and ptrissage for pain reduction. X8 minutes Posterior pelvic tilt 8x 3 second holds Cross body punches to PT hand x4 minutes  Standing: Static stand stare at sticky note on wall 62 seconds first trial in standard stance Lateral step out 10x each LE, SUE support with 4lb  ankle weights  Standing marches 15x each LE, SUE support     Ambulate 450 ft to elevator and upstairs to car. Car transfer with min A for RLE provided. One near LOB due to RLE buckling.    Pt educated throughout session about proper posture and technique with exercises. Improved exercise technique, movement at target joints, use of target muscles after min to mod verbal, visual, tactile cues.    Patient initially is able to tolerate progressive standing interventions however upon approximately 15 minutes into session his migraine progressed and required darkening of room. He had a return of his bronchitis requiring an additional two week absence from therapy. The patient continues to benefit from additional skilled PT services to improve LE strength and balance for decreased fall  risk and improved quality of life             PT Education - 11/17/21 1453     Education Details exercise technique, body mechanics    Person(s) Educated Patient    Methods Explanation;Demonstration;Tactile cues;Verbal cues    Comprehension Verbalized understanding;Returned demonstration;Verbal cues required;Tactile cues required              PT Short Term Goals - 11/06/21 1621       PT SHORT TERM GOAL #1   Title Patient will be independent in home exercise program to improve strength/mobility for better functional independence with ADLs and for self-management.    Baseline 3/3 HEP compliant 3/31 hep compliant    Time 6    Period Weeks    Status Achieved    Target Date 02/20/21      PT SHORT TERM GOAL #2   Title Patient will be modified independent in walking on even/uneven surface with least restrictive assistive device, for 10+ minutes without rest break, reporting some difficulty or less to improve walking tolerance with community ambulation including grocery shopping, going to church,etc.    Baseline 02/03: instability, LOB multiple times while ambulating to elevator 3/31: unable to test due to throwing up 6/20: 6 minutes 7/12: unable to test due to migraine 9/12: defer to next session 11/15: unable to test due to migraine    Time 6    Period Weeks    Status Partially Met    Target Date 12/18/21      PT SHORT TERM GOAL #3   Title Patient will increase ABC scale score >80% to demonstrate better functional mobility and better confidence with ADLs.    Baseline 01/09/21: 60.625% 3/3/: 50% 3/31: 63% 6/20: 45% 7/25: 40% 9/12: 51.1% 11/15: 48%    Time 6    Period Weeks    Status Partially Met    Target Date 12/18/21               PT Long Term Goals - 11/06/21 1621       PT LONG TERM GOAL #1   Title Patient will increase FOTO score to equal to or greater than 65 to demonstrate  statistically significant improvement in mobility and quality of life.     Baseline scored 41/100 on 05/21/2020; 7/22 40, 8/23: 60/100, 09/30/20=40, 01/09/21: 46 3/3: 40.3% 3/31: 49% 6/20: 45% 7/12: 50.7% 9/12: 44.7% 11/15: 40%    Time 12    Period Weeks    Status Partially Met    Target Date 01/29/22      PT LONG TERM GOAL #2   Title Patient will reduce falls risk as indicated by decreased TUG time to less than 11 seconds.    Baseline scored 37.9 sec with TUG on 05/21/2020; 21seconds with elevated rollator 7/22, 8/23: 13.62 sec with up and go walker, 01/09/21: 15.81s with hiking stick 3/3: 17.98 seconds one near LOB 3/31: 15.57 seconds with walking stick 6/20: 14.8 seconds with walking stick. 7/25: 19.72 seconds with walking stick. 9/26: 17.6 seconds 11/15: unable to test due to migraine    Time 12    Period Weeks    Status Partially Met    Target Date 01/29/22      PT LONG TERM GOAL #3   Title Patient will increase right UE and LE gross strength to 4+/5 throughout to improve functional strength for independent gait, increased standing tolerance and increased ADL ability.    Baseline grossly +3/5 to -4/5 right UE and LE strength on 05/21/2020; grossly 4-/5 for RUE, grossly 4-/5 for RLE on 7/22, grossly 4-/5 RUE, grossly 4/5 RLE 3/3: see note    Time 12    Period Weeks    Status Partially Met    Target Date 01/29/22      PT LONG TERM GOAL #4   Title Patient will increase 10 meter walk test to >1.56ms as to improve gait speed for better community ambulation and to reduce fall risk.    Baseline on 7/22 .57 m/sself selected, fast  .73 m/s with LOB pt reported R knee buckling, elevated rollator used, 8/23: 1.06 m/s with up and go walker, 01/09/21: 0.72 m/s with hiking stick 3/3: 0.74 m/s with walking stick 3/31: 0.81 m/s w walking stick 6/20: 1.1 m/s with walking stick    Time 12    Period Weeks    Status Achieved      PT LONG TERM GOAL #5   Title Patient will increase Berg Balance score by > 6 points to demonstrate decreased fall risk during functional activities.     Baseline 8/23: 37/56, 09/30/20=40/56, 12/20: 42/56 3/3: deferred 3/31 will perform next time; deferred due to throwing up 6/20: 46/56 7/12 38/56    Time 8    Period Weeks    Status Achieved      Additional Long Term Goals   Additional Long Term Goals Yes      PT LONG TERM GOAL #6   Title Patient (< 62years old) will complete five times sit to stand test in < 10 seconds indicating an increased LE strength and improved balance.    Baseline 8/23: 22 sec, 09/30/20=13.04 sec, 12/20: 27.05 sec, 01/09/21: 18.01s 3/3: 23 seconds no UE support 3/31; deferred due to patient throwing up 7/12: 15.2 seconds 11/15: 26 seconds    Time 12    Period Weeks    Status Partially Met    Target Date 01/29/22      PT LONG TERM GOAL #7   Title Patient will improve 6 min walk test >1000 feet with LRAD for improved gait ability in community.    Baseline 8/23: not tested; 8/30 845, goal adjusted >20085f 09/30/20=600 feet -  stopped test due to R ankle catching making gait unsafe, 11/25/20= 330 feet - stopped test due to severe dizziness, vomiting/nausea, sweating making gait unsafe; 01/09/21: 720 ft with no rest breaks 3/3: deferred due to nausea 3/31: deferred due to patient throwing up 6/20: 640 ft 7/12: 664f with walking stick, no rest breaks; 9/26: 1000 ft    Time 12    Period Weeks    Status Achieved      PT LONG TERM GOAL #8   Title Patient will increase Berg Balance score by > 6 points  (52/56) to demonstrate decreased fall risk during functional activities.    Baseline 6/20: 46/56 7/12: 38/56 with migraine 9/12: defer to next session 11/15:  unable to test due to migraine    Time 12    Period Weeks    Status On-going    Target Date 01/29/22      PT LONG TERM GOAL  #9   TITLE Patient will report decreased frequency of falling per week to 1x/week for decreased fall risk and improved quality of life    Baseline 12/1: 4 falls minimum a week    Time 12    Period Weeks    Status New    Target Date  01/29/22                   Plan - 11/17/21 1642     Clinical Impression Statement Patient initially is able to tolerate progressive standing interventions however upon approximately 15 minutes into session his migraine progressed and required darkening of room. He had a return of his bronchitis requiring an additional two week absence from therapy. The patient continues to benefit from additional skilled PT services to improve LE strength and balance for decreased fall risk and improved quality of life    Personal Factors and Comorbidities Comorbidity 3+;Time since onset of injury/illness/exacerbation    Comorbidities anxiety, back pain, COPD, headache, HTN, MI, sleep apnea, migraines    Examination-Activity Limitations Locomotion Level;Transfers;Bed Mobility;Stand;Stairs;Sleep;Squat;Bend    Examination-Participation Restrictions Driving;Medication Management    Stability/Clinical Decision Making Unstable/Unpredictable    Rehab Potential Fair    PT Frequency 2x / week    PT Duration 12 weeks    PT Treatment/Interventions ADLs/Self Care Home Management;Canalith Repostioning;Moist Heat;Electrical Stimulation;Gait training;Stair training;Functional mobility training;Therapeutic activities;Therapeutic exercise;Balance training;Neuromuscular re-education;Patient/family education;Manual techniques;Passive range of motion;Vestibular    PT Next Visit Plan Continue with functional mobility training and strengthening;    PT Home Exercise Plan Medbridge Access Code: [email or web address]    Consulted and Agree with Plan of Care Patient;Family member/caregiver    Family Member Consulted Wife             Patient will benefit from skilled therapeutic intervention in order to improve the following deficits and impairments:  Decreased activity tolerance, Decreased balance, Decreased cognition, Decreased mobility, Difficulty walking, Abnormal gait, Decreased range of motion, Dizziness, Pain, Impaired  flexibility, Decreased strength, Impaired sensation, Decreased endurance, Decreased safety awareness, Postural dysfunction, Improper body mechanics, Impaired UE functional use, Impaired vision/preception  Visit Diagnosis: Difficulty in walking, not elsewhere classified  Muscle weakness (generalized)  Unsteadiness on feet     Problem List Patient Active Problem List   Diagnosis Date Noted   [name] (coronary artery disease) 10/21/2021   Degenerative spondylolisthesis 03/25/2020   Physical deconditioning 03/04/2020   Lobar pneumonia, unspecified organism ([place] 03/04/2020   Intractable hemiplegic migraine with status migrainosus 03/02/2020   Current tobacco use 02/05/2020   Abnormal findings on diagnostic imaging of lung  02/05/2020   Shortness of breath 02/05/2020   Herniated disc, cervical 01/23/2020   Cervical spondylosis with myelopathy and radiculopathy 01/23/2020   Pre-operative respiratory examination 01/12/2020   Weakness on right side of face 12/22/2019   Osteoarthritis of spine with radiculopathy, lumbar region 12/11/2019   Intractable migraine with aura with status migrainosus 11/09/2019   Former smoker 08/27/2018   Benign essential HTN 08/27/2018   Cough productive of purulent sputum 11/08/2017   GERD (gastroesophageal reflux disease) 05/06/2017   Contusion of hand 04/22/2017   BP (high blood pressure) 04/22/2017   Oxygen desaturation 04/22/2017   Prostate lump 04/22/2017   Unstable angina ([place]) 04/27/2016   Chest pain 04/24/2016   Morbid obesity ([place]) 01/29/2016   Elevated ALT measurement 10/30/2015   Nonspecific elevation of levels of transaminase and lactic acid dehydrogenase (ldh) 10/30/2015   Reduced libido 10/29/2015   Hyperlipidemia 08/06/2015   Anxiety 08/06/2015   Atherosclerosis of coronary artery 08/06/2015   Childhood asthma 08/06/2015   Chronic obstructive pulmonary disease ([place]) 08/06/2015   OSA (obstructive sleep apnea) 08/06/2015   Anxiety disorder  08/06/2015   Atherosclerosis of native coronary artery of native heart with stable angina pectoris ([place]) 38/88/7579   Uncomplicated asthma 72/82/0601  Janna Arch, PT, DPT  11/17/2021, 4:44 PM  [facility] MAIN [facility] SERVICES [address] [place], [place], [address] Phone: [phone]   Fax:  [phone]  Name: BRIGHAM COBBINS MRN: [id number] Date of Birth: [birth year]

## 2021-11-20 ENCOUNTER — Ambulatory Visit: Payer: BC Managed Care – PPO

## 2021-11-24 ENCOUNTER — Ambulatory Visit: Payer: BC Managed Care – PPO

## 2021-11-24 ENCOUNTER — Other Ambulatory Visit: Payer: Self-pay

## 2021-11-24 DIAGNOSIS — R2681 Unsteadiness on feet: Secondary | ICD-10-CM

## 2021-11-24 DIAGNOSIS — R262 Difficulty in walking, not elsewhere classified: Secondary | ICD-10-CM

## 2021-11-24 DIAGNOSIS — M6281 Muscle weakness (generalized): Secondary | ICD-10-CM

## 2021-11-24 NOTE — Therapy (Signed)
[place] MAIN [facility] SERVICES [address] [place], [place], [address] Phone: [phone]   Fax:  [phone]  Physical Therapy Treatment  Patient Details  Name: Cory Espinoza MRN: [id number] Date of Birth: 01/03/1971 Referring Provider (PT): Dr. Joselyn Arrow   Encounter Date: 11/24/2021   PT End of Session - 11/24/21 1614     Visit Number 52    Number of Visits 90    Date for PT Re-Evaluation 01/29/22    Authorization Type [employer] PPO;    Authorization Time Period 8/10 PN 10/3    PT Start Time 1515    PT Stop Time 1600    PT Time Calculation (min) 45 min    Equipment Utilized During Treatment Gait belt    Activity Tolerance Patient tolerated treatment well;Treatment limited secondary to medical complications (Comment)   provoked symptoms with certain activities   Behavior During Therapy WFL for tasks assessed/performed             Past Medical History:  Diagnosis Date   Allergy    Anginal pain (HCC)    Anxiety    Arthritis    lumbar spine   Asthma    Atrial fibrillation (HCC)    Back pain    Severe Lumbar Pain   CHF (congestive heart failure) (HCC)    COPD (chronic obstructive pulmonary disease) (HCC)    Restrictive lung disease   Coronary artery disease    Dyspnea    Dysrhythmia    afib   GERD (gastroesophageal reflux disease)    Headache    [name], onset October 2020, cluster headaches in the past   History of kidney stones    Hyperlipidemia    Hypertension    Myocardial infarction ([place])    at age 64   Pneumonia    Restrictive lung disease    Sleep apnea    unable to use cpap since onset of migraines    Past Surgical History:  Procedure Laterality Date   ABDOMINAL EXPOSURE N/A 03/25/2020   Procedure: ABDOMINAL EXPOSURE;  Surgeon: Rosetta Posner, MD;  Location: [facility] OR;  Service: Vascular;  Laterality: N/A;   ANTERIOR CERVICAL DECOMP/DISCECTOMY FUSION N/A 01/23/2020   Procedure: ANTERIOR CERVICAL  DECOMPRESSION FUSION - CERVICAL SIX-CERVICAL SEVEN;  Surgeon: Earnie Larsson, MD;  Location: [place];  Service: Neurosurgery;  Laterality: N/A;   ANTERIOR LUMBAR FUSION N/A 03/25/2020   Procedure: ANTERIOR LUMBAR INTERBODY FUSION LUMBAR FIVE-SACRAL ONE.;  Surgeon: Earnie Larsson, MD;  Location: [place];  Service: Neurosurgery;  Laterality: N/A;  anterior   BACK SURGERY  1996   CARDIAC CATHETERIZATION Left 04/27/2016   Procedure: Left Heart Cath and Coronary Angiography;  Surgeon: Corey Skains, MD;  Location: [place] CV LAB;  Service: Cardiovascular;  Laterality: Left;   CARDIAC CATHETERIZATION N/A 04/27/2016   Procedure: Intravascular Pressure Wire/FFR Study;  Surgeon: Yolonda Kida, MD;  Location: [place] [facility];  Service: Cardiovascular;  Laterality: N/A;   CATHETERIZATION OF PULMONARY ARTERY WITH RETRIEVAL OF FOREIGN BODY Bilateral 04/07/2011   heart   DEGENERATIVE SPONDYLOLISTHESIS     KNEE ARTHROSCOPY Bilateral 2004   LUMBAR DISC SURGERY  1999   RADICULOPATHY, CERVICAL REGION     TONSILLECTOMY      There were no vitals filed for this visit.   Subjective Assessment - 11/24/21 1613     Subjective Patient reports low back pain the past few days which have been limiting him significantly. Did have a fall  today hitting his head in the shower.    Pertinent History Pt has a complex medical history. He reports that he fell during a [employer] training in [place] in the fall of 2020. He states that he fell 10-12 feet and struck his head, neck, and back. He was able to finish the course which took him approximately 45 minutes more to complete. He does endorse a second fall while finishing the course but did not suffer a second head injury. He states that he started getting headaches that day which have persisted since that time. 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 He does report improvement in his headaches recently.  Neurology was concerned about possible BPPV and have referred him for a vestibular evaluation. In addition since the injury, pt developed RUE and RLE pain and weakness. Pt states that NCV showed abnormal nerve conduction in RUE. He has since undergone a C7-7 ACDF on 01/23/20. He reports initial improvement in RUE strength, but states that he has a gradual return of his RUE weakness. He was also having significant RLE weakness and pain and underwent and L5-S1 anterior lumbar interbody fusion on 03/25/20. Patient reports that he does not have any back precautions but needs to wear the low back brace for one more month. Patient reports he has a follow-up appointment with the surgeon next month. Patient reports he has a follow-up appointment with Dr. Brigitte Pulse, neurologist, in August. He arrives to therapy ambulating with an upright rollator walker. Pt denies any mention of concussion after his injury. He has been having cognitive issues since his injury and has previously had a heavy metals screen as well as neurocognitive testing. He has had an MRI of his brain with and without contrast on 12/18/19. Results were mildly motion degraded examination. No evidence of acute intracranial abnormality. Minimal chronic small vessel ischemic disease. He has tremor in both his hands. He has a positive family history of Parkinson's disease in his grandfather. He is complaining of constant dizziness and unsteadiness which are worse with activity. Patient states that he frequently loses his balance and his wife has to steady him.    Limitations Lifting;Standing;Walking;House hold activities    Diagnostic tests He has had an MRI of his brain with and without contrast on 12/18/19. Results were mildly motion degraded examination. No evidence of acute intracranial abnormality. Minimal chronic small vessel ischemic disease.    Patient Stated Goals to be able to ambulate with a cane or no AD, to be able to drive, to be able to ride his  motorcycle    Currently in Pain? Yes    Pain Score 8     Pain Location Back    Pain Orientation Lower    Pain Descriptors / Indicators Aching;Squeezing    Pain Type Acute pain    Pain Onset In the past 7 days    Pain Frequency Constant                Treatment: Modified recline position with head pad behind low back: -hamstring lengthening 60 seconds each LE -single knee to chest 60 seconds each LE -IR hip rotation with single knee to chest 60 seconds each LE -ER hip rotation with single knee to chest 60 seconds each LE -LE rotation for low back pain reduction 60 seconds x 2 trials -posterior pelvic tilt 10x 3 second holds -single leg abduction with inhale/exhale for TrA activation 10x each  LE  Seated upright at edge of table: Large swiss ball forward trunk leans 4x15 second ohlds  Ultrasound:1.5 cm squared with 1.2 MHzx 8 minutes to  low back     Ambulate 450 ft to elevator and upstairs to car. Car transfer with min A for RLE provided. One near LOB due to RLE buckling.    Pt educated throughout session about proper posture and technique with exercises. Improved exercise technique, movement at target joints, use of target muscles after min to mod verbal, visual, tactile cues.       Patient presents with high level of low back pain severely limiting his ADL and quality of life. Supine strengthening and muscle tissue lengthening interventions tolerated well but patient reports no relief of symptoms. Reports slight relief of symptoms from ultrasound and forward trunk roll with ball. Patient instructed to add posterior pelvic tilt to HEP and wife verbalized understanding. The patient continues to benefit from additional skilled PT services to improve LE strength and balance for decreased fall risk and improved quality of life                  PT Education - 11/24/21 1614     Education Details exercise technique, body mechanics    Person(s) Educated Patient     Methods Explanation;Demonstration;Tactile cues;Verbal cues    Comprehension Verbalized understanding;Returned demonstration;Verbal cues required;Tactile cues required              PT Short Term Goals - 11/06/21 1621       PT SHORT TERM GOAL #1   Title Patient will be independent in home exercise program to improve strength/mobility for better functional independence with ADLs and for self-management.    Baseline 3/3 HEP compliant 3/31 hep compliant    Time 6    Period Weeks    Status Achieved    Target Date 02/20/21      PT SHORT TERM GOAL #2   Title Patient will be modified independent in walking on even/uneven surface with least restrictive assistive device, for 10+ minutes without rest break, reporting some difficulty or less to improve walking tolerance with community ambulation including grocery shopping, going to church,etc.    Baseline 02/03: instability, LOB multiple times while ambulating to elevator 3/31: unable to test due to throwing up 6/20: 6 minutes 7/12: unable to test due to migraine 9/12: defer to next session 11/15: unable to test due to migraine    Time 6    Period Weeks    Status Partially Met    Target Date 12/18/21      PT SHORT TERM GOAL #3   Title Patient will increase ABC scale score >80% to demonstrate better functional mobility and better confidence with ADLs.    Baseline 01/09/21: 60.625% 3/3/: 50% 3/31: 63% 6/20: 45% 7/25: 40% 9/12: 51.1% 11/15: 48%    Time 6    Period Weeks    Status Partially Met    Target Date 12/18/21               PT Long Term Goals - 11/06/21 1621       PT LONG TERM GOAL #1   Title Patient will increase FOTO score to equal to or greater than 65 to demonstrate statistically significant improvement in mobility and quality of life.    Baseline scored 41/100 on 05/21/2020; 7/22 40, 8/23: 60/100, 09/30/20=40, 01/09/21: 46 3/3: 40.3% 3/31: 49% 6/20: 45% 7/12: 50.7% 9/12: 44.7% 11/15: 40%    Time 12  Period Weeks     Status Partially Met    Target Date 01/29/22      PT LONG TERM GOAL #2   Title Patient will reduce falls risk as indicated by decreased TUG time to less than 11 seconds.    Baseline scored 37.9 sec with TUG on 05/21/2020; 21seconds with elevated rollator 7/22, 8/23: 13.62 sec with up and go walker, 01/09/21: 15.81s with hiking stick 3/3: 17.98 seconds one near LOB 3/31: 15.57 seconds with walking stick 6/20: 14.8 seconds with walking stick. 7/25: 19.72 seconds with walking stick. 9/26: 17.6 seconds 11/15: unable to test due to migraine    Time 12    Period Weeks    Status Partially Met    Target Date 01/29/22      PT LONG TERM GOAL #3   Title Patient will increase right UE and LE gross strength to 4+/5 throughout to improve functional strength for independent gait, increased standing tolerance and increased ADL ability.    Baseline grossly +3/5 to -4/5 right UE and LE strength on 05/21/2020; grossly 4-/5 for RUE, grossly 4-/5 for RLE on 7/22, grossly 4-/5 RUE, grossly 4/5 RLE 3/3: see note    Time 12    Period Weeks    Status Partially Met    Target Date 01/29/22      PT LONG TERM GOAL #4   Title Patient will increase 10 meter walk test to >1.12ms as to improve gait speed for better community ambulation and to reduce fall risk.    Baseline on 7/22 .57 m/sself selected, fast  .73 m/s with LOB pt reported R knee buckling, elevated rollator used, 8/23: 1.06 m/s with up and go walker, 01/09/21: 0.72 m/s with hiking stick 3/3: 0.74 m/s with walking stick 3/31: 0.81 m/s w walking stick 6/20: 1.1 m/s with walking stick    Time 12    Period Weeks    Status Achieved      PT LONG TERM GOAL #5   Title Patient will increase Berg Balance score by > 6 points to demonstrate decreased fall risk during functional activities.    Baseline 8/23: 37/56, 09/30/20=40/56, 12/20: 42/56 3/3: deferred 3/31 will perform next time; deferred due to throwing up 6/20: 46/56 7/12 38/56    Time 8    Period Weeks     Status Achieved      Additional Long Term Goals   Additional Long Term Goals Yes      PT LONG TERM GOAL #6   Title Patient (< 623years old) will complete five times sit to stand test in < 10 seconds indicating an increased LE strength and improved balance.    Baseline 8/23: 22 sec, 09/30/20=13.04 sec, 12/20: 27.05 sec, 01/09/21: 18.01s 3/3: 23 seconds no UE support 3/31; deferred due to patient throwing up 7/12: 15.2 seconds 11/15: 26 seconds    Time 12    Period Weeks    Status Partially Met    Target Date 01/29/22      PT LONG TERM GOAL #7   Title Patient will improve 6 min walk test >1000 feet with LRAD for improved gait ability in community.    Baseline 8/23: not tested; 8/30 845, goal adjusted >20062f 09/30/20=600 feet - stopped test due to R ankle catching making gait unsafe, 11/25/20= 330 feet - stopped test due to severe dizziness, vomiting/nausea, sweating making gait unsafe; 01/09/21: 720 ft with no rest breaks 3/3: deferred due to nausea 3/31: deferred due to patient throwing up  6/20: 640 ft 7/12: 681f with walking stick, no rest breaks; 9/26: 1000 ft    Time 12    Period Weeks    Status Achieved      PT LONG TERM GOAL #8   Title Patient will increase Berg Balance score by > 6 points  (52/56) to demonstrate decreased fall risk during functional activities.    Baseline 6/20: 46/56 7/12: 38/56 with migraine 9/12: defer to next session 11/15:  unable to test due to migraine    Time 12    Period Weeks    Status On-going    Target Date 01/29/22      PT LONG TERM GOAL  #9   TITLE Patient will report decreased frequency of falling per week to 1x/week for decreased fall risk and improved quality of life    Baseline 12/1: 4 falls minimum a week    Time 12    Period Weeks    Status New    Target Date 01/29/22                   Plan - 11/24/21 1620     Clinical Impression Statement Patient presents with high level of low back pain severely limiting his ADL and  quality of life. Supine strengthening and muscle tissue lengthening interventions tolerated well but patient reports no relief of symptoms. Reports slight relief of symptoms from ultrasound and forward trunk roll with ball. Patient instructed to add posterior pelvic tilt to HEP and wife verbalized understanding. The patient continues to benefit from additional skilled PT services to improve LE strength and balance for decreased fall risk and improved quality of life    Personal Factors and Comorbidities Comorbidity 3+;Time since onset of injury/illness/exacerbation    Comorbidities anxiety, back pain, COPD, headache, HTN, MI, sleep apnea, migraines    Examination-Activity Limitations Locomotion Level;Transfers;Bed Mobility;Stand;Stairs;Sleep;Squat;Bend    Examination-Participation Restrictions Driving;Medication Management    Stability/Clinical Decision Making Unstable/Unpredictable    Rehab Potential Fair    PT Frequency 2x / week    PT Duration 12 weeks    PT Treatment/Interventions ADLs/Self Care Home Management;Canalith Repostioning;Moist Heat;Electrical Stimulation;Gait training;Stair training;Functional mobility training;Therapeutic activities;Therapeutic exercise;Balance training;Neuromuscular re-education;Patient/family education;Manual techniques;Passive range of motion;Vestibular    PT Next Visit Plan Continue with functional mobility training and strengthening;    PT Home Exercise Plan Medbridge Access Code: [email or web address]    Consulted and Agree with Plan of Care Patient;Family member/caregiver    Family Member Consulted Wife             Patient will benefit from skilled therapeutic intervention in order to improve the following deficits and impairments:  Decreased activity tolerance, Decreased balance, Decreased cognition, Decreased mobility, Difficulty walking, Abnormal gait, Decreased range of motion, Dizziness, Pain, Impaired flexibility, Decreased strength, Impaired sensation,  Decreased endurance, Decreased safety awareness, Postural dysfunction, Improper body mechanics, Impaired UE functional use, Impaired vision/preception  Visit Diagnosis: Difficulty in walking, not elsewhere classified  Muscle weakness (generalized)  Unsteadiness on feet     Problem List Patient Active Problem List   Diagnosis Date Noted   [name] (coronary artery disease) 10/21/2021   Degenerative spondylolisthesis 03/25/2020   Physical deconditioning 03/04/2020   Lobar pneumonia, unspecified organism [place] 03/04/2020   Intractable hemiplegic migraine with status migrainosus 03/02/2020   Current tobacco use 02/05/2020   Abnormal findings on diagnostic imaging of lung 02/05/2020   Shortness of breath 02/05/2020   Herniated disc, cervical 01/23/2020   Cervical spondylosis with myelopathy and radiculopathy 01/23/2020  Pre-operative respiratory examination 01/12/2020   Weakness on right side of face 12/22/2019   Osteoarthritis of spine with radiculopathy, lumbar region 12/11/2019   Intractable migraine with aura with status migrainosus 11/09/2019   Former smoker 08/27/2018   Benign essential HTN 08/27/2018   Cough productive of purulent sputum 11/08/2017   GERD (gastroesophageal reflux disease) 05/06/2017   Contusion of hand 04/22/2017   BP (high blood pressure) 04/22/2017   Oxygen desaturation 04/22/2017   Prostate lump 04/22/2017   Unstable angina ([place]) 04/27/2016   Chest pain 04/24/2016   Morbid obesity ([place]) 01/29/2016   Elevated ALT measurement 10/30/2015   Nonspecific elevation of levels of transaminase and lactic acid dehydrogenase (ldh) 10/30/2015   Reduced libido 10/29/2015   Hyperlipidemia 08/06/2015   Anxiety 08/06/2015   Atherosclerosis of coronary artery 08/06/2015   Childhood asthma 08/06/2015   Chronic obstructive pulmonary disease ([place]) 08/06/2015   OSA (obstructive sleep apnea) 08/06/2015   Anxiety disorder 08/06/2015   Atherosclerosis of native coronary  artery of native heart with stable angina pectoris ([place]) 68/16/6196   Uncomplicated asthma 94/08/8285    Janna Arch, PT, DPT  11/24/2021, 4:21 PM  [place] [facility]IN [facility] SERVICES [address] [place], [place], [address] Phone: [phone]   Fax:  [phone]  Name: Cory Espinoza MRN: [id number] Date of Birth: November 12, 1971

## 2021-11-27 ENCOUNTER — Ambulatory Visit: Payer: BC Managed Care – PPO

## 2021-12-02 ENCOUNTER — Other Ambulatory Visit: Payer: Self-pay

## 2021-12-02 ENCOUNTER — Ambulatory Visit
Admission: RE | Admit: 2021-12-02 | Discharge: 2021-12-02 | Disposition: A | Discharge status: Home / Self Care | Payer: BC Managed Care – PPO | Source: Ambulatory Visit | Attending: Neurology | Admitting: Neurology

## 2021-12-02 DIAGNOSIS — M5412 Radiculopathy, cervical region: Secondary | ICD-10-CM | POA: Insufficient documentation

## 2021-12-04 ENCOUNTER — Ambulatory Visit: Payer: BC Managed Care – PPO

## 2021-12-11 ENCOUNTER — Ambulatory Visit: Payer: BC Managed Care – PPO

## 2021-12-15 ENCOUNTER — Other Ambulatory Visit: Payer: Self-pay

## 2021-12-15 ENCOUNTER — Ambulatory Visit: Payer: BC Managed Care – PPO | Attending: Neurology

## 2021-12-15 DIAGNOSIS — M6281 Muscle weakness (generalized): Secondary | ICD-10-CM | POA: Insufficient documentation

## 2021-12-15 DIAGNOSIS — R262 Difficulty in walking, not elsewhere classified: Secondary | ICD-10-CM | POA: Insufficient documentation

## 2021-12-15 DIAGNOSIS — R2681 Unsteadiness on feet: Secondary | ICD-10-CM | POA: Diagnosis present

## 2021-12-15 NOTE — Therapy (Signed)
[place] MAIN [facility] SERVICES [address] [place], [place], [address] Phone: [phone]   Fax:  [phone]  Physical Therapy Treatment  Patient Details  Name: Cory Espinoza MRN: [id number] Date of Birth: [birth year] No data recorded  Encounter Date: 12/15/2021   PT End of Session - 12/15/21 1710     Visit Number 57    Number of Visits 90    Date for PT Re-Evaluation 01/29/22    Authorization Type BCBS PPO;    Authorization Time Period 9/10 PN 10/3    PT Start Time 1515    PT Stop Time 1559    PT Time Calculation (min) 44 min    Equipment Utilized During Treatment Gait belt    Activity Tolerance Patient tolerated treatment well;Treatment limited secondary to medical complications (Comment)   provoked symptoms with certain activities   Behavior During Therapy WFL for tasks assessed/performed             Past Medical History:  Diagnosis Date   Allergy    Anginal pain (HCC)    Anxiety    Arthritis    lumbar spine   Asthma    Atrial fibrillation (HCC)    Back pain    Severe Lumbar Pain   CHF (congestive heart failure) (HCC)    COPD (chronic obstructive pulmonary disease) (HCC)    Restrictive lung disease   Coronary artery disease    Dyspnea    Dysrhythmia    afib   GERD (gastroesophageal reflux disease)    Headache    [name], onset October 2020, cluster headaches in the past   History of kidney stones    Hyperlipidemia    Hypertension    Myocardial infarction ([place])    at age 29   Pneumonia    Restrictive lung disease    Sleep apnea    unable to use cpap since onset of migraines    Past Surgical History:  Procedure Laterality Date   ABDOMINAL EXPOSURE N/A 03/25/2020   Procedure: ABDOMINAL EXPOSURE;  Surgeon: Rosetta Posner, MD;  Location: [facility] OR;  Service: Vascular;  Laterality: N/A;   ANTERIOR CERVICAL DECOMP/DISCECTOMY FUSION N/A 01/23/2020   Procedure: ANTERIOR CERVICAL DECOMPRESSION FUSION - CERVICAL  SIX-CERVICAL SEVEN;  Surgeon: Earnie Larsson, MD;  Location: [facility];  Service: Neurosurgery;  Laterality: N/A;   ANTERIOR LUMBAR FUSION N/A 03/25/2020   Procedure: ANTERIOR LUMBAR INTERBODY FUSION LUMBAR FIVE-SACRAL ONE.;  Surgeon: Earnie Larsson, MD;  Location: [facility];  Service: Neurosurgery;  Laterality: N/A;  anterior   BACK SURGERY  1996   CARDIAC CATHETERIZATION Left 04/27/2016   Procedure: Left Heart Cath and Coronary Angiography;  Surgeon: Corey Skains, MD;  Location: [place];  Service: Cardiovascular;  Laterality: Left;   CARDIAC CATHETERIZATION N/A 04/27/2016   Procedure: Intravascular Pressure Wire/FFR Study;  Surgeon: Yolonda Kida, MD;  Location: [facility];  Service: Cardiovascular;  Laterality: N/A;   CATHETERIZATION OF PULMONARY ARTERY WITH RETRIEVAL OF FOREIGN BODY Bilateral 04/07/2011   heart   DEGENERATIVE SPONDYLOLISTHESIS     KNEE ARTHROSCOPY Bilateral 2004   LUMBAR DISC SURGERY  1999   RADICULOPATHY, CERVICAL REGION     TONSILLECTOMY      There were no vitals filed for this visit.   Subjective Assessment - 12/15/21 1709     Subjective Patient fell last week while in his shop injuring his back and hitting his head. Has been taking muscle relaxers.  Pertinent History Pt has a complex medical history. 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 He states that he fell 10-12 feet and struck his head, neck, and back. He was able to finish the course which took him approximately 45 minutes more to complete. He does endorse a second fall while finishing the course but did not suffer a second head injury. He states that he started getting headaches that day which have persisted since that time. 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 He does report improvement in his headaches recently. Neurology was concerned about possible BPPV and have referred him  for a vestibular evaluation. In addition since the injury, pt developed RUE and RLE pain and weakness. Pt states that NCV showed abnormal nerve conduction in RUE. He has since undergone a C7-7 ACDF on 01/23/20. He reports initial improvement in RUE strength, but states that he has a gradual return of his RUE weakness. He was also having significant RLE weakness and pain and underwent and L5-S1 anterior lumbar interbody fusion on 03/25/20. Patient reports that he does not have any back precautions but needs to wear the low back brace for one more month. Patient reports he has a follow-up appointment with the surgeon next month. Patient reports he has a follow-up appointment with Dr. Brigitte Pulse, neurologist, in August. He arrives to therapy ambulating with an upright rollator walker. Pt denies any mention of concussion after his injury. He has been having cognitive issues since his injury and has previously had a heavy metals screen as well as neurocognitive testing. He has had an MRI of his brain with and without contrast on 12/18/19. Results were mildly motion degraded examination. No evidence of acute intracranial abnormality. Minimal chronic small vessel ischemic disease. He has tremor in both his hands. He has a positive family history of Parkinson's disease in his grandfather. He is complaining of constant dizziness and unsteadiness which are worse with activity. Patient states that he frequently loses his balance and his wife has to steady him.    Limitations Lifting;Standing;Walking;House hold activities    Diagnostic tests He has had an MRI of his brain with and without contrast on 12/18/19. Results were mildly motion degraded examination. No evidence of acute intracranial abnormality. Minimal chronic small vessel ischemic disease.    Patient Stated Goals to be able to ambulate with a cane or no AD, to be able to drive, to be able to ride his motorcycle    Currently in Pain? Yes    Pain Score 8     Pain Location  Back    Pain Orientation Lower    Pain Descriptors / Indicators Aching    Pain Type Acute pain    Pain Onset In the past 7 days    Pain Frequency Constant                 Treatment: Performed in back room with lights dimmed due to migraine.   Seated RTB forward trunk leans 10x; 2 sets with cues to look forward at sticky note on wall to reduce headache/dizziness; reports alleviates some back pain (added to HEP with adaption to kitchen table)   GTB seated: -hamstring curl 10x each LE -adduction 15x each LE -abduction 20x bilaterally -alternating LAQ 10x each LE -row 15x -bicep curl with band under feet 12x  Seated alternating step outs 10x each LE  Ambulate 190 ft inside and outside to reach car, curb negotiation and object negotiation with walking stick and occasional Min A to remain upright.     Pt educated throughout session about proper posture and technique with exercises. Improved exercise technique, movement at target joints, use of target muscles after min to mod verbal, visual, tactile cues.   Patient has returned from three week absence from illness, holidays, and injury. Patient is highly motivated despite his recent medical complications. Flexion based interventions reduced pain levels in low back. Next session will benefit from core stabilization training. The patient continues to benefit from additional skilled PT services to improve LE strength and balance for decreased fall risk and improved quality of life                  PT Education - 12/15/21 1709     Education Details pain reduction techniques, body mechanics    Person(s) Educated Patient    Methods Explanation;Demonstration;Tactile cues;Verbal cues    Comprehension Verbalized understanding;Returned demonstration;Verbal cues required;Tactile cues required              PT Short Term Goals - 11/06/21 1621       PT SHORT TERM GOAL #1   Title Patient will be independent in home  exercise program to improve strength/mobility for better functional independence with ADLs and for self-management.    Baseline 3/3 HEP compliant 3/31 hep compliant    Time 6    Period Weeks    Status Achieved    Target Date 02/20/21      PT SHORT TERM GOAL #2   Title Patient will be modified independent in walking on even/uneven surface with least restrictive assistive device, for 10+ minutes without rest break, reporting some difficulty or less to improve walking tolerance with community ambulation including grocery shopping, going to church,etc.    Baseline 02/03: instability, LOB multiple times while ambulating to elevator 3/31: unable to test due to throwing up 6/20: 6 minutes 7/12: unable to test due to migraine 9/12: defer to next session 11/15: unable to test due to migraine    Time 6    Period Weeks    Status Partially Met    Target Date 12/18/21      PT SHORT TERM GOAL #3   Title Patient will increase ABC scale score >80% to demonstrate better functional mobility and better confidence with ADLs.    Baseline 01/09/21: 60.625% 3/3/: 50% 3/31: 63% 6/20: 45% 7/25: 40% 9/12: 51.1% 11/15: 48%    Time 6    Period Weeks    Status Partially Met    Target Date 12/18/21               PT Long Term Goals - 11/06/21 1621       PT LONG TERM GOAL #1   Title Patient will increase FOTO score to equal to or greater than 65 to demonstrate statistically significant improvement in mobility and quality of life.    Baseline scored 41/100 on 05/21/2020; 7/22 40, 8/23: 60/100, 09/30/20=40, 01/09/21: 46 3/3: 40.3% 3/31: 49% 6/20: 45% 7/12: 50.7% 9/12: 44.7% 11/15: 40%    Time 12    Period Weeks    Status Partially Met    Target Date 01/29/22      PT LONG TERM GOAL #2   Title Patient will reduce falls risk as indicated by decreased TUG time to less than 11 seconds.    Baseline scored 37.9 sec with TUG on 05/21/2020;  21seconds with elevated rollator 7/22, 8/23: 13.62 sec with up and go walker,  01/09/21: 15.81s with hiking stick 3/3: 17.98 seconds one near LOB 3/31: 15.57 seconds with walking stick 6/20: 14.8 seconds with walking stick. 7/25: 19.72 seconds with walking stick. 9/26: 17.6 seconds 11/15: unable to test due to migraine    Time 12    Period Weeks    Status Partially Met    Target Date 01/29/22      PT LONG TERM GOAL #3   Title Patient will increase right UE and LE gross strength to 4+/5 throughout to improve functional strength for independent gait, increased standing tolerance and increased ADL ability.    Baseline grossly +3/5 to -4/5 right UE and LE strength on 05/21/2020; grossly 4-/5 for RUE, grossly 4-/5 for RLE on 7/22, grossly 4-/5 RUE, grossly 4/5 RLE 3/3: see note    Time 12    Period Weeks    Status Partially Met    Target Date 01/29/22      PT LONG TERM GOAL #4   Title Patient will increase 10 meter walk test to >1.16ms as to improve gait speed for better community ambulation and to reduce fall risk.    Baseline on 7/22 .57 m/sself selected, fast  .73 m/s with LOB pt reported R knee buckling, elevated rollator used, 8/23: 1.06 m/s with up and go walker, 01/09/21: 0.72 m/s with hiking stick 3/3: 0.74 m/s with walking stick 3/31: 0.81 m/s w walking stick 6/20: 1.1 m/s with walking stick    Time 12    Period Weeks    Status Achieved      PT LONG TERM GOAL #5   Title Patient will increase Berg Balance score by > 6 points to demonstrate decreased fall risk during functional activities.    Baseline 8/23: 37/56, 09/30/20=40/56, 12/20: 42/56 3/3: deferred 3/31 will perform next time; deferred due to throwing up 6/20: 46/56 7/12 38/56    Time 8    Period Weeks    Status Achieved      Additional Long Term Goals   Additional Long Term Goals Yes      PT LONG TERM GOAL #6   Title Patient (< 631years old) will complete five times sit to stand test in < 10 seconds indicating an increased LE strength and improved balance.    Baseline 8/23: 22 sec, 09/30/20=13.04 sec,  12/20: 27.05 sec, 01/09/21: 18.01s 3/3: 23 seconds no UE support 3/31; deferred due to patient throwing up 7/12: 15.2 seconds 11/15: 26 seconds    Time 12    Period Weeks    Status Partially Met    Target Date 01/29/22      PT LONG TERM GOAL #7   Title Patient will improve 6 min walk test >1000 feet with LRAD for improved gait ability in community.    Baseline 8/23: not tested; 8/30 845, goal adjusted >20023f 09/30/20=600 feet - stopped test due to R ankle catching making gait unsafe, 11/25/20= 330 feet - stopped test due to severe dizziness, vomiting/nausea, sweating making gait unsafe; 01/09/21: 720 ft with no rest breaks 3/3: deferred due to nausea 3/31: deferred due to patient throwing up 6/20: 640 ft 7/12: [id number] walking stick, no rest breaks; 9/26: 1000 ft    Time 12    Period Weeks    Status Achieved      PT LONG TERM GOAL #8   Title Patient will increase Berg Balance score by > 6 points  (52/56)  to demonstrate decreased fall risk during functional activities.    Baseline 6/20: 46/56 7/12: 38/56 with migraine 9/12: defer to next session 11/15:  unable to test due to migraine    Time 12    Period Weeks    Status On-going    Target Date 01/29/22      PT LONG TERM GOAL  #9   TITLE Patient will report decreased frequency of falling per week to 1x/week for decreased fall risk and improved quality of life    Baseline 12/1: 4 falls minimum a week    Time 12    Period Weeks    Status New    Target Date 01/29/22                   Plan - 12/15/21 1714     Clinical Impression Statement Patient has returned from three week absence from illness, holidays, and injury. Patient is highly motivated despite his recent medical complications. Flexion based interventions reduced pain levels in low back. Next session will benefit from core stabilization training. The patient continues to benefit from additional skilled PT services to improve LE strength and balance for decreased fall  risk and improved quality of life    Personal Factors and Comorbidities Comorbidity 3+;Time since onset of injury/illness/exacerbation    Comorbidities anxiety, back pain, COPD, headache, HTN, MI, sleep apnea, migraines    Examination-Activity Limitations Locomotion Level;Transfers;Bed Mobility;Stand;Stairs;Sleep;Squat;Bend    Examination-Participation Restrictions Driving;Medication Management    Stability/Clinical Decision Making Unstable/Unpredictable    Rehab Potential Fair    PT Frequency 2x / week    PT Duration 12 weeks    PT Treatment/Interventions ADLs/Self Care Home Management;Canalith Repostioning;Moist Heat;Electrical Stimulation;Gait training;Stair training;Functional mobility training;Therapeutic activities;Therapeutic exercise;Balance training;Neuromuscular re-education;Patient/family education;Manual techniques;Passive range of motion;Vestibular    PT Next Visit Plan Continue with functional mobility training and strengthening;    PT Home Exercise Plan [facility] Access Code: [email or web address]    Consulted and Agree with Plan of Care Patient;Family member/caregiver    Family Member Consulted Wife             Patient will benefit from skilled therapeutic intervention in order to improve the following deficits and impairments:  Decreased activity tolerance, Decreased balance, Decreased cognition, Decreased mobility, Difficulty walking, Abnormal gait, Decreased range of motion, Dizziness, Pain, Impaired flexibility, Decreased strength, Impaired sensation, Decreased endurance, Decreased safety awareness, Postural dysfunction, Improper body mechanics, Impaired UE functional use, Impaired vision/preception  Visit Diagnosis: Difficulty in walking, not elsewhere classified  Muscle weakness (generalized)  Unsteadiness on feet     Problem List Patient Active Problem List   Diagnosis Date Noted   [name] (coronary artery disease) 10/21/2021   Degenerative spondylolisthesis 03/25/2020    Physical deconditioning 03/04/2020   Lobar pneumonia, unspecified organism ([place]) 03/04/2020   Intractable hemiplegic migraine with status migrainosus 03/02/2020   Current tobacco use 02/05/2020   Abnormal findings on diagnostic imaging of lung 02/05/2020   Shortness of breath 02/05/2020   Herniated disc, cervical 01/23/2020   Cervical spondylosis with myelopathy and radiculopathy 01/23/2020   Pre-operative respiratory examination 01/12/2020   Weakness on right side of face 12/22/2019   Osteoarthritis of spine with radiculopathy, lumbar region 12/11/2019   Intractable migraine with aura with status migrainosus 11/09/2019   Former smoker 08/27/2018   Benign essential HTN 08/27/2018   Cough productive of purulent sputum 11/08/2017   GERD (gastroesophageal reflux disease) 05/06/2017   Contusion of hand 04/22/2017   BP (high blood pressure) 04/22/2017   Oxygen  desaturation 04/22/2017   Prostate lump 04/22/2017   Unstable angina ([place]) 04/27/2016   Chest pain 04/24/2016   Morbid obesity ([place]) 01/29/2016   Elevated ALT measurement 10/30/2015   Nonspecific elevation of levels of transaminase and lactic acid dehydrogenase (ldh) 10/30/2015   Reduced libido 10/29/2015   Hyperlipidemia 08/06/2015   Anxiety 08/06/2015   Atherosclerosis of coronary artery 08/06/2015   Childhood asthma 08/06/2015   Chronic obstructive pulmonary disease ([place]) 08/06/2015   OSA (obstructive sleep apnea) 08/06/2015   Anxiety disorder 08/06/2015   Atherosclerosis of native coronary artery of native heart with stable angina pectoris ([place]) 54/62/7035   Uncomplicated asthma 00/93/8182    Janna Arch, PT, DPT  12/15/2021, 5:15 PM  [place] [facility] MAIN [facility] SERVICES [address] [place], [place], [address] Phone: [phone]   Fax:  [phone]  Name: Cory Espinoza MRN: [id number] Date of Birth: 10/27/1971

## 2021-12-18 ENCOUNTER — Ambulatory Visit: Payer: BC Managed Care – PPO

## 2021-12-22 ENCOUNTER — Other Ambulatory Visit: Payer: Self-pay

## 2021-12-22 ENCOUNTER — Ambulatory Visit: Payer: BC Managed Care – PPO

## 2021-12-22 DIAGNOSIS — R262 Difficulty in walking, not elsewhere classified: Secondary | ICD-10-CM

## 2021-12-22 DIAGNOSIS — M6281 Muscle weakness (generalized): Secondary | ICD-10-CM

## 2021-12-22 DIAGNOSIS — R2681 Unsteadiness on feet: Secondary | ICD-10-CM

## 2021-12-22 NOTE — Therapy (Signed)
[place] MAIN [facility] SERVICES [address] [place], [place], [address] Phone: [phone]   Fax:  [phone]  Physical Therapy Treatment/ Physical Therapy Progress Note   Dates of reporting period  09/08/21   to   12/22/21   Patient Details  Name: Cory Espinoza MRN: [id number] Date of Birth: 12-15-1970 No data recorded  Encounter Date: 12/22/2021   PT End of Session - 12/22/21 1511     Visit Number 70    Number of Visits 90    Date for PT Re-Evaluation 01/29/22    Authorization Type [employer] PPO;    Authorization Time Period 10/10 PN 10/3; next session 1/10 PN 12/22/21    PT Start Time 1515    PT Stop Time 1559    PT Time Calculation (min) 44 min    Equipment Utilized During Treatment Gait belt    Activity Tolerance Patient tolerated treatment well;Treatment limited secondary to medical complications (Comment)   provoked symptoms with certain activities   Behavior During Therapy WFL for tasks assessed/performed             Past Medical History:  Diagnosis Date   Allergy    Anginal pain (HCC)    Anxiety    Arthritis    lumbar spine   Asthma    Atrial fibrillation (HCC)    Back pain    Severe Lumbar Pain   CHF (congestive heart failure) (HCC)    COPD (chronic obstructive pulmonary disease) (HCC)    Restrictive lung disease   Coronary artery disease    Dyspnea    Dysrhythmia    afib   GERD (gastroesophageal reflux disease)    Headache    [name], onset October 2020, cluster headaches in the past   History of kidney stones    Hyperlipidemia    Hypertension    Myocardial infarction ([place])    at age 24   Pneumonia    Restrictive lung disease    Sleep apnea    unable to use cpap since onset of migraines    Past Surgical History:  Procedure Laterality Date   ABDOMINAL EXPOSURE N/A 03/25/2020   Procedure: ABDOMINAL EXPOSURE;  Surgeon: Rosetta Posner, MD;  Location: [facility] OR;  Service: Vascular;  Laterality: N/A;    ANTERIOR CERVICAL DECOMP/DISCECTOMY FUSION N/A 01/23/2020   Procedure: ANTERIOR CERVICAL DECOMPRESSION FUSION - CERVICAL SIX-CERVICAL SEVEN;  Surgeon: Earnie Larsson, MD;  Location: [place];  Service: Neurosurgery;  Laterality: N/A;   ANTERIOR LUMBAR FUSION N/A 03/25/2020   Procedure: ANTERIOR LUMBAR INTERBODY FUSION LUMBAR FIVE-SACRAL ONE.;  Surgeon: Earnie Larsson, MD;  Location: [place];  Service: Neurosurgery;  Laterality: N/A;  anterior   BACK SURGERY  1996   CARDIAC CATHETERIZATION Left 04/27/2016   Procedure: Left Heart Cath and Coronary Angiography;  Surgeon: Corey Skains, MD;  Location: [place] [facility];  Service: Cardiovascular;  Laterality: Left;   CARDIAC CATHETERIZATION N/A 04/27/2016   Procedure: Intravascular Pressure Wire/FFR Study;  Surgeon: Yolonda Kida, MD;  Location: [place] [facility];  Service: Cardiovascular;  Laterality: N/A;   CATHETERIZATION OF PULMONARY ARTERY WITH RETRIEVAL OF FOREIGN BODY Bilateral 04/07/2011   heart   DEGENERATIVE SPONDYLOLISTHESIS     KNEE ARTHROSCOPY Bilateral 2004   LUMBAR DISC SURGERY  1999   RADICULOPATHY, CERVICAL REGION     TONSILLECTOMY      There were no vitals filed for this visit.   Subjective Assessment - 12/23/21 7858  Subjective Patient presents with high pain levels of back and migraine. Had another fall in his shop on Saturday. Was mostly bedbound from the weather last week,had to return to using his walker for 3 days.    Pertinent History Pt has a complex medical history. 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 He states that he fell 10-12 feet and struck his head, neck, and back. He was able to finish the course which took him approximately 45 minutes more to complete. He does endorse a second fall while finishing the course but did not suffer a second head injury. He states that he started getting headaches that day which have persisted since that time. He is currently under the care  of neurology who is treating him for hemiplegic migraines and R occipital neuralgia. He does report improvement in his headaches recently. Neurology was concerned about possible BPPV and have referred him for a vestibular evaluation. In addition since the injury, pt developed RUE and RLE pain and weakness. Pt states that NCV showed abnormal nerve conduction in RUE. He has since undergone a C7-7 ACDF on 01/23/20. He reports initial improvement in RUE strength, but states that he has a gradual return of his RUE weakness. He was also having significant RLE weakness and pain and underwent and L5-S1 anterior lumbar interbody fusion on 03/25/20. Patient reports that he does not have any back precautions but needs to wear the low back brace for one more month. Patient reports he has a follow-up appointment with the surgeon next month. Patient reports he has a follow-up appointment with Dr. Brigitte Pulse, neurologist, in August. He arrives to therapy ambulating with an upright rollator walker. Pt denies any mention of concussion after his injury. He has been having cognitive issues since his injury and has previously had a heavy metals screen as well as neurocognitive testing. He has had an MRI of his brain with and without contrast on 12/18/19. Results were mildly motion degraded examination. No evidence of acute intracranial abnormality. Minimal chronic small vessel ischemic disease. He has tremor in both his hands. He has a positive family history of Parkinson's disease in his grandfather. He is complaining of constant dizziness and unsteadiness which are worse with activity. Patient states that he frequently loses his balance and his wife has to steady him.    Limitations Lifting;Standing;Walking;House hold activities    Diagnostic tests He has had an MRI of his brain with and without contrast on 12/18/19. Results were mildly motion degraded examination. No evidence of acute intracranial abnormality. Minimal chronic small vessel  ischemic disease.    Patient Stated Goals to be able to ambulate with a cane or no AD, to be able to drive, to be able to ride his motorcycle    Currently in Pain? Yes    Pain Score 8     Pain Location --   back and migraine   Pain Descriptors / Indicators Aching    Pain Type Chronic pain    Pain Onset In the past 7 days               Patient presents with high pain levels of back and migraine. Had another fall in his shop on Saturday. Was mostly bedbound from the weather last week,had to return to using his walker for 3 days.    Progress note ABC: 35% FOTO: 40%  TUG-deferred due to pain 5x STS-deferred due to pain BERG-deferred due to  pain Fall frequency-one fall since last week    Treatment:    in room with lights off and noise damped due to migraine.    Ultrasound:1.5 cm squared with 1.2 MHzx 8 minutes to  low back   STM to bilateral upper trap and cervical musculature with implementation of effleurage and pettrisage x 13 minutes for pain reduction.   GTB: -hamstring curl 15x each LE -adduction 15x each LE -Row 15x with cue for arms close to side -ER 15x with cues for elbows tucked  Patient's condition has the potential to improve in response to therapy. Maximum improvement is yet to be obtained. The anticipated improvement is attainable and reasonable in a generally predictable time.  Patient reports his back pain is severe today limiting his session.      Patient's progress note limited by high levels of low back pain, cervical pain, and migraine. Pain reduction techniques utilized and allowed for low grade gentle strengthening by end of session. Will benefit from performing outcomes next session. Patient's condition has the potential to improve in response to therapy. Maximum improvement is yet to be obtained. The anticipated improvement is attainable and reasonable in a generally predictable time.The patient continues to benefit from additional skilled PT  services to improve LE strength and balance for decreased fall risk and improved quality of life                  PT Education - 12/22/21 1509     Education Details goals, POC    Person(s) Educated Patient    Methods Explanation;Demonstration;Verbal cues;Tactile cues    Comprehension Verbalized understanding;Returned demonstration;Verbal cues required;Tactile cues required              PT Short Term Goals - 12/23/21 1245       PT SHORT TERM GOAL #1   Title Patient will be independent in home exercise program to improve strength/mobility for better functional independence with ADLs and for self-management.    Baseline 3/3 HEP compliant 3/31 hep compliant    Time 6    Period Weeks    Status Achieved    Target Date 02/20/21      PT SHORT TERM GOAL #2   Title Patient will be modified independent in walking on even/uneven surface with least restrictive assistive device, for 10+ minutes without rest break, reporting some difficulty or less to improve walking tolerance with community ambulation including grocery shopping, going to church,etc.    Baseline 02/03: instability, LOB multiple times while ambulating to elevator 3/31: unable to test due to throwing up 6/20: 6 minutes 7/12: unable to test due to migraine 9/12: defer to next session 11/15: unable to test due to migraine    Time 6    Period Weeks    Status Partially Met    Target Date 12/18/21      PT SHORT TERM GOAL #3   Title Patient will increase ABC scale score >80% to demonstrate better functional mobility and better confidence with ADLs.    Baseline 01/09/21: 60.625% 3/3/: 50% 3/31: 63% 6/20: 45% 7/25: 40% 9/12: 51.1% 11/15: 48% 1/17: 40%    Time 6    Period Weeks    Status Partially Met    Target Date 12/18/21               PT Long Term Goals - 12/23/21 1245       PT LONG TERM GOAL #1   Title Patient will increase FOTO score to equal  to or greater than 65 to demonstrate statistically  significant improvement in mobility and quality of life.    Baseline scored 41/100 on 05/21/2020; 7/22 40, 8/23: 60/100, 09/30/20=40, 01/09/21: 46 3/3: 40.3% 3/31: 49% 6/20: 45% 7/12: 50.7% 9/12: 44.7% 11/15: 40% 1/17: 40%    Time 12    Period Weeks    Status Partially Met    Target Date 01/29/22      PT LONG TERM GOAL #2   Title Patient will reduce falls risk as indicated by decreased TUG time to less than 11 seconds.    Baseline scored 37.9 sec with TUG on 05/21/2020; 21seconds with elevated rollator 7/22, 8/23: 13.62 sec with up and go walker, 01/09/21: 15.81s with hiking stick 3/3: 17.98 seconds one near LOB 3/31: 15.57 seconds with walking stick 6/20: 14.8 seconds with walking stick. 7/25: 19.72 seconds with walking stick. 9/26: 17.6 seconds 11/15: unable to test due to migraine 1/17: deferred due to pain    Time 12    Period Weeks    Status Partially Met    Target Date 01/29/22      PT LONG TERM GOAL #3   Title Patient will increase right UE and LE gross strength to 4+/5 throughout to improve functional strength for independent gait, increased standing tolerance and increased ADL ability.    Baseline grossly +3/5 to -4/5 right UE and LE strength on 05/21/2020; grossly 4-/5 for RUE, grossly 4-/5 for RLE on 7/22, grossly 4-/5 RUE, grossly 4/5 RLE 3/3: see note    Time 12    Period Weeks    Status Partially Met    Target Date 01/29/22      PT LONG TERM GOAL #4   Title Patient will increase 10 meter walk test to >1.40ms as to improve gait speed for better community ambulation and to reduce fall risk.    Baseline on 7/22 .57 m/sself selected, fast  .73 m/s with LOB pt reported R knee buckling, elevated rollator used, 8/23: 1.06 m/s with up and go walker, 01/09/21: 0.72 m/s with hiking stick 3/3: 0.74 m/s with walking stick 3/31: 0.81 m/s w walking stick 6/20: 1.1 m/s with walking stick    Time 12    Period Weeks    Status Achieved      PT LONG TERM GOAL #5   Title Patient will increase  Berg Balance score by > 6 points to demonstrate decreased fall risk during functional activities.    Baseline 8/23: 37/56, 09/30/20=40/56, 12/20: 42/56 3/3: deferred 3/31 will perform next time; deferred due to throwing up 6/20: 46/56 7/12 38/56    Time 8    Period Weeks    Status Achieved      PT LONG TERM GOAL #6   Title Patient (< 687years old) will complete five times sit to stand test in < 10 seconds indicating an increased LE strength and improved balance.    Baseline 8/23: 22 sec, 09/30/20=13.04 sec, 12/20: 27.05 sec, 01/09/21: 18.01s 3/3: 23 seconds no UE support 3/31; deferred due to patient throwing up 7/12: 15.2 seconds 11/15: 26 seconds 1/17: deferred due to pain    Time 12    Period Weeks    Status Partially Met    Target Date 01/29/22      PT LONG TERM GOAL #7   Title Patient will improve 6 min walk test >1000 feet with LRAD for improved gait ability in community.    Baseline 8/23: not tested; 8/30 845, goal adjusted >20093f  09/30/20=600 feet - stopped test due to R ankle catching making gait unsafe, 11/25/20= 330 feet - stopped test due to severe dizziness, vomiting/nausea, sweating making gait unsafe; 01/09/21: 720 ft with no rest breaks 3/3: deferred due to nausea 3/31: deferred due to patient throwing up 6/20: 640 ft 7/12: 697f with walking stick, no rest breaks; 9/26: 1000 ft    Time 12    Period Weeks    Status Achieved      PT LONG TERM GOAL #8   Title Patient will increase Berg Balance score by > 6 points  (52/56) to demonstrate decreased fall risk during functional activities.    Baseline 6/20: 46/56 7/12: 38/56 with migraine 9/12: defer to next session 11/15:  unable to test due to migraine 1/17: deferred due to pain    Time 12    Period Weeks    Status On-going    Target Date 01/29/22      PT LONG TERM GOAL  #9   TITLE Patient will report decreased frequency of falling per week to 1x/week for decreased fall risk and improved quality of life    Baseline 12/1:  4 falls minimum a week 1/17: 1 fall last week    Time 12    Period Weeks    Status Partially Met    Target Date 01/29/22                   Plan - 12/23/21 1244     Clinical Impression Statement Patient's progress note limited by high levels of low back pain, cervical pain, and migraine. Pain reduction techniques utilized and allowed for low grade gentle strengthening by end of session. Will benefit from performing outcomes next session. Patient's condition has the potential to improve in response to therapy. Maximum improvement is yet to be obtained. The anticipated improvement is attainable and reasonable in a generally predictable time.The patient continues to benefit from additional skilled PT services to improve LE strength and balance for decreased fall risk and improved quality of life    Personal Factors and Comorbidities Comorbidity 3+;Time since onset of injury/illness/exacerbation    Comorbidities anxiety, back pain, COPD, headache, HTN, MI, sleep apnea, migraines    Examination-Activity Limitations Locomotion Level;Transfers;Bed Mobility;Stand;Stairs;Sleep;Squat;Bend    Examination-Participation Restrictions Driving;Medication Management    Stability/Clinical Decision Making Unstable/Unpredictable    Rehab Potential Fair    PT Frequency 2x / week    PT Duration 12 weeks    PT Treatment/Interventions ADLs/Self Care Home Management;Canalith Repostioning;Moist Heat;Electrical Stimulation;Gait training;Stair training;Functional mobility training;Therapeutic activities;Therapeutic exercise;Balance training;Neuromuscular re-education;Patient/family education;Manual techniques;Passive range of motion;Vestibular    PT Next Visit Plan Continue with functional mobility training and strengthening;    PT Home Exercise Plan Medbridge Access Code: [email or web address]    Consulted and Agree with Plan of Care Patient;Family member/caregiver    Family Member Consulted Wife             Patient  will benefit from skilled therapeutic intervention in order to improve the following deficits and impairments:  Decreased activity tolerance, Decreased balance, Decreased cognition, Decreased mobility, Difficulty walking, Abnormal gait, Decreased range of motion, Dizziness, Pain, Impaired flexibility, Decreased strength, Impaired sensation, Decreased endurance, Decreased safety awareness, Postural dysfunction, Improper body mechanics, Impaired UE functional use, Impaired vision/preception  Visit Diagnosis: Difficulty in walking, not elsewhere classified  Muscle weakness (generalized)  Unsteadiness on feet     Problem List Patient Active Problem List   Diagnosis Date Noted   [name] (coronary artery disease) 10/21/2021  Degenerative spondylolisthesis 03/25/2020   Physical deconditioning 03/04/2020   Lobar pneumonia, unspecified organism ([place]) 03/04/2020   Intractable hemiplegic migraine with status migrainosus 03/02/2020   Current tobacco use 02/05/2020   Abnormal findings on diagnostic imaging of lung 02/05/2020   Shortness of breath 02/05/2020   Herniated disc, cervical 01/23/2020   Cervical spondylosis with myelopathy and radiculopathy 01/23/2020   Pre-operative respiratory examination 01/12/2020   Weakness on right side of face 12/22/2019   Osteoarthritis of spine with radiculopathy, lumbar region 12/11/2019   Intractable migraine with aura with status migrainosus 11/09/2019   Former smoker 08/27/2018   Benign essential HTN 08/27/2018   Cough productive of purulent sputum 11/08/2017   GERD (gastroesophageal reflux disease) 05/06/2017   Contusion of hand 04/22/2017   BP (high blood pressure) 04/22/2017   Oxygen desaturation 04/22/2017   Prostate lump 04/22/2017   Unstable angina ([place]) 04/27/2016   Chest pain 04/24/2016   Morbid obesity ([place]) 01/29/2016   Elevated ALT measurement 10/30/2015   Nonspecific elevation of levels of transaminase and lactic acid dehydrogenase (ldh)  10/30/2015   Reduced libido 10/29/2015   Hyperlipidemia 08/06/2015   Anxiety 08/06/2015   Atherosclerosis of coronary artery 08/06/2015   Childhood asthma 08/06/2015   Chronic obstructive pulmonary disease ([place]) 08/06/2015   OSA (obstructive sleep apnea) 08/06/2015   Anxiety disorder 08/06/2015   Atherosclerosis of native coronary artery of native heart with stable angina pectoris ([place]) 58/25/1898   Uncomplicated asthma 42/09/3127    Janna Arch, PT, DPT  12/23/2021, 12:48 PM  [place] MAIN [facility] SERVICES [place], [place], [address] Phone: [phone]   Fax:  [phone]  Name: Cory Espinoza MRN: [id number] Date of Birth: 11-11-1971

## 2021-12-25 ENCOUNTER — Ambulatory Visit: Payer: BC Managed Care – PPO

## 2021-12-26 ENCOUNTER — Emergency Department: Payer: BC Managed Care – PPO

## 2021-12-26 ENCOUNTER — Encounter: Payer: Self-pay | Admitting: Intensive Care

## 2021-12-26 ENCOUNTER — Other Ambulatory Visit: Payer: Self-pay

## 2021-12-26 ENCOUNTER — Emergency Department
Admission: EM | Admit: 2021-12-26 | Discharge: 2021-12-26 | Disposition: A | Discharge status: Home / Self Care | Payer: BC Managed Care – PPO | Attending: Emergency Medicine | Admitting: Emergency Medicine

## 2021-12-26 DIAGNOSIS — S61412A Laceration without foreign body of left hand, initial encounter: Secondary | ICD-10-CM | POA: Diagnosis not present

## 2021-12-26 DIAGNOSIS — W298XXA Contact with other powered powered hand tools and household machinery, initial encounter: Secondary | ICD-10-CM | POA: Diagnosis not present

## 2021-12-26 DIAGNOSIS — R569 Unspecified convulsions: Secondary | ICD-10-CM | POA: Insufficient documentation

## 2021-12-26 DIAGNOSIS — S6992XA Unspecified injury of left wrist, hand and finger(s), initial encounter: Secondary | ICD-10-CM | POA: Diagnosis present

## 2021-12-26 MED ORDER — LIDOCAINE HCL (PF) 1 % IJ SOLN
INTRAMUSCULAR | Status: AC
  Administered 2021-12-26: 10 mL
  Filled 2021-12-26: qty 5

## 2021-12-26 MED ORDER — LIDOCAINE HCL (CARDIAC) PF 100 MG/5ML IV SOSY
PREFILLED_SYRINGE | INTRAVENOUS | Status: AC
  Filled 2021-12-26: qty 5

## 2021-12-26 MED ORDER — LIDOCAINE HCL (PF) 1 % IJ SOLN: 10.0000 mL | Freq: Once | INTRAMUSCULAR | Status: AC

## 2021-12-26 NOTE — ED Provider Notes (Signed)
[facility] Provider Note    Event Date/Time   First MD Initiated Contact with Patient 12/26/21 1904     (approximate)   History   Extremity Laceration   HPI  Cory Espinoza is a 51 y.o. 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  He was holding it in his right hand and it jumped out of the right hand and injured his left palm.  He he had a large amount of bleeding on his way to the ER.  In addition, the wife reports that he had a seizure.  However, she states that he has been diagnosed with nonepileptic seizures and she believes that the stress of the injury precipitated it.  Per the wife, the patient was standing when she found him and did not fall or hit his head.  He is slightly confused and unable to give much history; the wife states that this is normal for him after his seizure episodes.  There is no other injury.      Physical Exam   Triage Vital Signs: ED Triage Vitals  Enc Vitals Group     BP 12/26/21 1822 119/72     Pulse Rate 12/26/21 1822 75     Resp 12/26/21 1822 18     Temp 12/26/21 1822 (!) 97.4 F (36.3 C)     Temp Source 12/26/21 1822 Oral     SpO2 12/26/21 1822 94 %     Weight 12/26/21 1825 258 lb (117 kg)     Height 12/26/21 1825 6' (1.829 m)     Head Circumference --      Peak Flow --      Pain Score 12/26/21 1825 10     Pain Loc --      Pain Edu? --      Excl. in [place]? --     Most recent vital signs: Vitals:   12/26/21 1822 12/26/21 2005  BP: 119/72 106/85  Pulse: 75 65  Resp: 18 18  Temp: (!) 97.4 F (36.3 C)   SpO2: 94% 99%     General: Awake,, mildly confused, no distress.  CV:  Good peripheral perfusion.  Resp:  Normal effort.  Abd:  No distention.  Neuro:  Motor intact in all extremities, with right upper  and lower extremity weakness which is baseline for the patient.  Right facial droop, also baseline for the patient.  Normal speech.  No pronator drift.  Normal coordination. Other:  Approximately 5 cm diagonal laceration to left palm to level of subcutaneous fat.  No visible tendon or nerve.  1 tiny superficial bleeding artery just under the skin.  Full range of motion and normal motor strength in extension and flexion to all digits of the left hand.  Normal sensation and cap refill distally.   ED Results / Procedures / Treatments   Labs (all labs ordered are listed, but only abnormal results are displayed) Labs Reviewed - No data to display   EKG     RADIOLOGY   PROCEDURES:  Critical Care performed: No  ..Laceration Repair  Date/Time: 12/26/2021 8:09 PM Performed by: Arta Silence, MD Authorized by: Arta Silence, MD   Consent:    Consent obtained:  Verbal   Consent given by:  Patient and spouse   Risks  discussed:  Infection, pain, retained foreign body, poor cosmetic result and poor wound healing Universal protocol:    Patient identity confirmed:  Verbally with patient and arm band Anesthesia:    Anesthesia method:  Local infiltration   Local anesthetic:  Lidocaine 1% w/o epi Laceration details:    Location:  Hand   Hand location:  L palm   Length (cm):  5   Depth (mm):  7 Pre-procedure details:    Preparation:  Imaging obtained to evaluate for foreign bodies Exploration:    Hemostasis achieved with:  Tied off vessels and tourniquet   Imaging obtained: x-ray     Imaging outcome: foreign body not noted     Wound exploration: entire depth of wound visualized     Wound extent: no foreign bodies/material noted, no nerve damage noted, no tendon damage noted and no underlying fracture noted     Contaminated: no   Treatment:    Area cleansed with:  Saline   Amount of cleaning:  Extensive   Irrigation solution:  Sterile saline   Irrigation method:   Syringe   Visualized foreign bodies/material removed: no     Debridement:  None Skin repair:    Repair method:  Sutures   Suture size:  3-0   Suture material:  Nylon   Suture technique:  Simple interrupted   Number of sutures:  11 Approximation:    Approximation:  Close Repair type:    Repair type:  Simple Post-procedure details:    Dressing:  Sterile dressing and bulky dressing   Procedure completion:  Tolerated well, no immediate complications   MEDICATIONS ORDERED IN ED: Medications  lidocaine (cardiac) 100 mg/82mL (XYLOCAINE) 100 MG/5ML injection 2% (  Given by Other 12/26/21 1910)  lidocaine (PF) (XYLOCAINE) 1 % injection 10 mL (10 mLs Intradermal Given 12/26/21 1829)     IMPRESSION / MDM / [place] / ED COURSE  I reviewed the triage vital signs and the nursing notes.  51 year old male with PMH as noted above presents with a left hand laceration sustained with a Dremel tool, and also subsequently had a seizure-like episode.  I discussed the patient's case with his wife.  She reports that the patient has a history of nonepileptic seizures.  She states that his mild confusion currently is normal after his seizure episodes.  She reports that she saw him right after the seizure and he was standing and did not appear to have fallen.  Exam is as above; there is a laceration to left palm which is to the level of subcutaneous tissue but with no exposed tendon or muscle.  The patient has normal range of motion and neuro/vascular intact distally.  There is a tiny superficial bleeding artery in the laceration.  The patient is on Plavix.  I proceeded with immediate laceration repair including hemostasis of the bleeding artery with an adjacent suture through the skin.  We will obtain an x-ray to rule out foreign body or bony injury, although there is no evidence of foreign body on exam and I do not suspect deeper injury.  ----------------------------------------- 8:08 PM on  12/26/2021 -----------------------------------------  X-ray is negative.  Hemostasis has been maintained.  The patient is now alert and oriented x4 and at his baseline mental status with no remaining postictal symptoms.  The patient is stable for discharge.  Return precautions given, and he expresses understanding.   FINAL CLINICAL IMPRESSION(S) / ED DIAGNOSES   Final diagnoses:  Laceration of left palm, initial encounter  Seizure [facility])     Rx / DC Orders   ED Discharge Orders     None        Note:  This document was prepared using Dragon voice recognition software and may include unintentional dictation errors.    Arta Silence, MD 12/26/21 2010

## 2021-12-26 NOTE — Discharge Instructions (Signed)
Return here or to your regular doctor in 10 days to have the stitches removed.  Return to the ER immediately for new or worsening pain or bleeding, weakness or numbness, difficulty moving the hand, or any other new or worsening symptoms that concern you.

## 2021-12-26 NOTE — ED Triage Notes (Signed)
First Nurse Note:  Arrives with laceration to left hand.  Cut with dremmel.  Laceration seen to palm of hand.v shaped.  Bleeding not controlled. Pressure dressing applied, bleeding persisting.

## 2021-12-26 NOTE — ED Triage Notes (Signed)
Patient was working in his shop and cut his left hand. Wife reports once he saw all the blood he started having seizures. Takes medication for seizures. Continuous bleeding from site and clots noted.

## 2021-12-29 ENCOUNTER — Ambulatory Visit: Payer: BC Managed Care – PPO

## 2022-01-01 ENCOUNTER — Ambulatory Visit: Payer: BC Managed Care – PPO

## 2022-01-01 ENCOUNTER — Other Ambulatory Visit: Payer: Self-pay

## 2022-01-01 DIAGNOSIS — R2681 Unsteadiness on feet: Secondary | ICD-10-CM

## 2022-01-01 DIAGNOSIS — M6281 Muscle weakness (generalized): Secondary | ICD-10-CM

## 2022-01-01 DIAGNOSIS — R262 Difficulty in walking, not elsewhere classified: Secondary | ICD-10-CM | POA: Diagnosis not present

## 2022-01-01 NOTE — Therapy (Signed)
[place] MAIN [facility] SERVICES [address] [place], [place], [address] Phone: [phone]   Fax:  [phone]  Physical Therapy Treatment  Patient Details  Name: VIVAN AGOSTINO MRN: [id number] Date of Birth: May 27, 1971 No data recorded  Encounter Date: 01/01/2022   PT End of Session - 01/01/22 1619     Visit Number 33    Number of Visits 90    Date for PT Re-Evaluation 01/29/22    Authorization Type [employer]BS PPO;    Authorization Time Period 1/10 PN 12/22/21    PT Start Time 1515    PT Stop Time 1610    PT Time Calculation (min) 55 min    Equipment Utilized During Treatment Gait belt    Activity Tolerance Patient tolerated treatment well;Treatment limited secondary to medical complications (Comment)   provoked symptoms with certain activities   Behavior During Therapy WFL for tasks assessed/performed             Past Medical History:  Diagnosis Date   Allergy    Anginal pain (HCC)    Anxiety    Arthritis    lumbar spine   Asthma    Atrial fibrillation (HCC)    Back pain    Severe Lumbar Pain   CHF (congestive heart failure) (HCC)    COPD (chronic obstructive pulmonary disease) (HCC)    Restrictive lung disease   Coronary artery disease    Dyspnea    Dysrhythmia    afib   GERD (gastroesophageal reflux disease)    Headache    [name], onset October 2020, cluster headaches in the past   History of kidney stones    Hyperlipidemia    Hypertension    Myocardial infarction ([place])    at age 43   Pneumonia    Restrictive lung disease    Sleep apnea    unable to use cpap since onset of migraines    Past Surgical History:  Procedure Laterality Date   ABDOMINAL EXPOSURE N/A 03/25/2020   Procedure: ABDOMINAL EXPOSURE;  Surgeon: Rosetta Posner, MD;  Location: [facility] OR;  Service: Vascular;  Laterality: N/A;   ANTERIOR CERVICAL DECOMP/DISCECTOMY FUSION N/A 01/23/2020   Procedure: ANTERIOR CERVICAL DECOMPRESSION FUSION -  CERVICAL SIX-CERVICAL SEVEN;  Surgeon: Earnie Larsson, MD;  Location: [facility];  Service: Neurosurgery;  Laterality: N/A;   ANTERIOR LUMBAR FUSION N/A 03/25/2020   Procedure: ANTERIOR LUMBAR INTERBODY FUSION LUMBAR FIVE-SACRAL ONE.;  Surgeon: Earnie Larsson, MD;  Location: [facility];  Service: Neurosurgery;  Laterality: N/A;  anterior   BACK SURGERY  1996   CARDIAC CATHETERIZATION Left 04/27/2016   Procedure: Left Heart Cath and Coronary Angiography;  Surgeon: Corey Skains, MD;  Location: [facility];  Service: Cardiovascular;  Laterality: Left;   CARDIAC CATHETERIZATION N/A 04/27/2016   Procedure: Intravascular Pressure Wire/FFR Study;  Surgeon: Yolonda Kida, MD;  Location: [facility];  Service: Cardiovascular;  Laterality: N/A;   CATHETERIZATION OF PULMONARY ARTERY WITH RETRIEVAL OF FOREIGN BODY Bilateral 04/07/2011   heart   DEGENERATIVE SPONDYLOLISTHESIS     KNEE ARTHROSCOPY Bilateral 2004   LUMBAR DISC SURGERY  1999   RADICULOPATHY, CERVICAL REGION     TONSILLECTOMY      There were no vitals filed for this visit.   Subjective Assessment - 01/01/22 1617     Subjective Since last session patient has been to the ER after an accident in his shop with a drimel saw resulting in a large  hand wound and arterial puncture. Patient had a seizure after the accident and has limited recollection of the event.    Pertinent History Pt has a complex medical history. 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 He states that he fell 10-12 feet and struck his head, neck, and back. He was able to finish the course which took him approximately 45 minutes more to complete. He does endorse a second fall while finishing the course but did not suffer a second head injury. He states that he started getting headaches that day which have persisted since that time. He is currently under the care of neurology who is treating him for hemiplegic migraines and R occipital  neuralgia. He does report improvement in his headaches recently. Neurology was concerned about possible BPPV and have referred him for a vestibular evaluation. In addition since the injury, pt developed RUE and RLE pain and weakness. Pt states that NCV showed abnormal nerve conduction in RUE. He has since undergone a C7-7 ACDF on 01/23/20. He reports initial improvement in RUE strength, but states that he has a gradual return of his RUE weakness. He was also having significant RLE weakness and pain and underwent and L5-S1 anterior lumbar interbody fusion on 03/25/20. Patient reports that he does not have any back precautions but needs to wear the low back brace for one more month. Patient reports he has a follow-up appointment with the surgeon next month. Patient reports he has a follow-up appointment with Dr. Brigitte Pulse, neurologist, in August. He arrives to therapy ambulating with an upright rollator walker. Pt denies any mention of concussion after his injury. He has been having cognitive issues since his injury and has previously had a heavy metals screen as well as neurocognitive testing. He has had an MRI of his brain with and without contrast on 12/18/19. Results were mildly motion degraded examination. No evidence of acute intracranial abnormality. Minimal chronic small vessel ischemic disease. He has tremor in both his hands. He has a positive family history of Parkinson's disease in his grandfather. He is complaining of constant dizziness and unsteadiness which are worse with activity. Patient states that he frequently loses his balance and his wife has to steady him.    Limitations Lifting;Standing;Walking;House hold activities    Diagnostic tests He has had an MRI of his brain with and without contrast on 12/18/19. Results were mildly motion degraded examination. No evidence of acute intracranial abnormality. Minimal chronic small vessel ischemic disease.    Patient Stated Goals to be able to ambulate with a  cane or no AD, to be able to drive, to be able to ride his motorcycle    Currently in Pain? Yes    Pain Score 8     Pain Location Head    Pain Orientation Mid    Pain Descriptors / Indicators Aching;Stabbing    Pain Type Chronic pain;Neuropathic pain    Pain Onset More than a month ago    Pain Frequency Intermittent                Unable to perform walking tasks of progress note from previous sessions due to hand wound.   Seated:  3lb ankle weight: -LAQ 10x each LE  Transitioned to back support on vestibular table (conformed to chair position) 3lb ankle weights: -heel slides 10x each LE; 2 sets each LE -abduction/adduction windshield wiper 10x each LE; 2 sets -SLR 10x each LE; 2 sets  GTB: -adduction 15x each LE -dorsiflexion 15x each LE  STM to cervical paraspinals with implementation of trigger point reduction technique x 6 minutes  Gluteal squeezes 10x 3 second holds  Ambulate >600 ft with PT from back room with lights off to car upstairs with cane in non injured hand and PT holding gait belt and injured hand's elbow for stabilization, frequent tripping over cane due to being on opposite side of patient's norm.   Car transfer with assistance for set up. Min A for stabilization.    Pt educated throughout session about proper posture and technique with exercises. Improved exercise technique, movement at target joints, use of target muscles after min to mod verbal, visual, tactile cues.  Patient returning to therapy after accident resulting in arterial puncture of L hand. Patient normally uses walking stick in Left hand due to RLE deficits however is unable to grip stick at this time due to injury. He is not able to perform the walking tests from previous session until wound has been healed and able to grip stick again. Patient tolerated progressive strengthening interventions well despite pain. patient continues to benefit from additional skilled PT services to improve  LE strength and balance for decreased fall risk and improved quality of life                          PT Education - 01/01/22 1618     Education Details exercise technique, body mechanics    Person(s) Educated Patient    Methods Explanation;Demonstration;Tactile cues;Verbal cues    Comprehension Verbalized understanding;Returned demonstration;Verbal cues required;Tactile cues required              PT Short Term Goals - 12/23/21 1245       PT SHORT TERM GOAL #1   Title Patient will be independent in home exercise program to improve strength/mobility for better functional independence with ADLs and for self-management.    Baseline 3/3 HEP compliant 3/31 hep compliant    Time 6    Period Weeks    Status Achieved    Target Date 02/20/21      PT SHORT TERM GOAL #2   Title Patient will be modified independent in walking on even/uneven surface with least restrictive assistive device, for 10+ minutes without rest break, reporting some difficulty or less to improve walking tolerance with community ambulation including grocery shopping, going to church,etc.    Baseline 02/03: instability, LOB multiple times while ambulating to elevator 3/31: unable to test due to throwing up 6/20: 6 minutes 7/12: unable to test due to migraine 9/12: defer to next session 11/15: unable to test due to migraine    Time 6    Period Weeks    Status Partially Met    Target Date 12/18/21      PT SHORT TERM GOAL #3   Title Patient will increase ABC scale score >80% to demonstrate better functional mobility and better confidence with ADLs.    Baseline 01/09/21: 60.625% 3/3/: 50% 3/31: 63% 6/20: 45% 7/25: 40% 9/12: 51.1% 11/15: 48% 1/17: 40%    Time 6    Period Weeks    Status Partially Met    Target Date 12/18/21               PT Long Term Goals - 12/23/21 1245       PT LONG TERM GOAL #1   Title Patient will increase FOTO score to equal to or greater than  65 to demonstrate  statistically significant improvement in mobility and quality of life.    Baseline scored 41/100 on 05/21/2020; 7/22 40, 8/23: 60/100, 09/30/20=40, 01/09/21: 46 3/3: 40.3% 3/31: 49% 6/20: 45% 7/12: 50.7% 9/12: 44.7% 11/15: 40% 1/17: 40%    Time 12    Period Weeks    Status Partially Met    Target Date 01/29/22      PT LONG TERM GOAL #2   Title Patient will reduce falls risk as indicated by decreased TUG time to less than 11 seconds.    Baseline scored 37.9 sec with TUG on 05/21/2020; 21seconds with elevated rollator 7/22, 8/23: 13.62 sec with up and go walker, 01/09/21: 15.81s with hiking stick 3/3: 17.98 seconds one near LOB 3/31: 15.57 seconds with walking stick 6/20: 14.8 seconds with walking stick. 7/25: 19.72 seconds with walking stick. 9/26: 17.6 seconds 11/15: unable to test due to migraine 1/17: deferred due to pain    Time 12    Period Weeks    Status Partially Met    Target Date 01/29/22      PT LONG TERM GOAL #3   Title Patient will increase right UE and LE gross strength to 4+/5 throughout to improve functional strength for independent gait, increased standing tolerance and increased ADL ability.    Baseline grossly +3/5 to -4/5 right UE and LE strength on 05/21/2020; grossly 4-/5 for RUE, grossly 4-/5 for RLE on 7/22, grossly 4-/5 RUE, grossly 4/5 RLE 3/3: see note    Time 12    Period Weeks    Status Partially Met    Target Date 01/29/22      PT LONG TERM GOAL #4   Title Patient will increase 10 meter walk test to >1.54ms as to improve gait speed for better community ambulation and to reduce fall risk.    Baseline on 7/22 .57 m/sself selected, fast  .73 m/s with LOB pt reported R knee buckling, elevated rollator used, 8/23: 1.06 m/s with up and go walker, 01/09/21: 0.72 m/s with hiking stick 3/3: 0.74 m/s with walking stick 3/31: 0.81 m/s w walking stick 6/20: 1.1 m/s with walking stick    Time 12    Period Weeks    Status Achieved      PT LONG TERM GOAL #5   Title Patient  will increase Berg Balance score by > 6 points to demonstrate decreased fall risk during functional activities.    Baseline 8/23: 37/56, 09/30/20=40/56, 12/20: 42/56 3/3: deferred 3/31 will perform next time; deferred due to throwing up 6/20: 46/56 7/12 38/56    Time 8    Period Weeks    Status Achieved      PT LONG TERM GOAL #6   Title Patient (< 686years old) will complete five times sit to stand test in < 10 seconds indicating an increased LE strength and improved balance.    Baseline 8/23: 22 sec, 09/30/20=13.04 sec, 12/20: 27.05 sec, 01/09/21: 18.01s 3/3: 23 seconds no UE support 3/31; deferred due to patient throwing up 7/12: 15.2 seconds 11/15: 26 seconds 1/17: deferred due to pain    Time 12    Period Weeks    Status Partially Met    Target Date 01/29/22      PT LONG TERM GOAL #7   Title Patient will improve 6 min walk test >1000 feet with LRAD for improved gait ability in community.    Baseline 8/23: not tested; 8/30 845, goal adjusted >20040f 09/30/20=600 feet - stopped  test due to R ankle catching making gait unsafe, 11/25/20= 330 feet - stopped test due to severe dizziness, vomiting/nausea, sweating making gait unsafe; 01/09/21: 720 ft with no rest breaks 3/3: deferred due to nausea 3/31: deferred due to patient throwing up 6/20: 640 ft 7/12: 681f with walking stick, no rest breaks; 9/26: 1000 ft    Time 12    Period Weeks    Status Achieved      PT LONG TERM GOAL #8   Title Patient will increase Berg Balance score by > 6 points  (52/56) to demonstrate decreased fall risk during functional activities.    Baseline 6/20: 46/56 7/12: 38/56 with migraine 9/12: defer to next session 11/15:  unable to test due to migraine 1/17: deferred due to pain    Time 12    Period Weeks    Status On-going    Target Date 01/29/22      PT LONG TERM GOAL  #9   TITLE Patient will report decreased frequency of falling per week to 1x/week for decreased fall risk and improved quality of life     Baseline 12/1: 4 falls minimum a week 1/17: 1 fall last week    Time 12    Period Weeks    Status Partially Met    Target Date 01/29/22                   Plan - 01/01/22 1626     Clinical Impression Statement Patient returning to therapy after accident resulting in arterial puncture of L hand. Patient normally uses walking stick in Left hand due to RLE deficits however is unable to grip stick at this time due to injury. He is not able to perform the walking tests from previous session until wound has been healed and able to grip stick again. Patient tolerated progressive strengthening interventions well despite pain. patient continues to benefit from additional skilled PT services to improve LE strength and balance for decreased fall risk and improved quality of life    Personal Factors and Comorbidities Comorbidity 3+;Time since onset of injury/illness/exacerbation    Comorbidities anxiety, back pain, COPD, headache, HTN, MI, sleep apnea, migraines    Examination-Activity Limitations Locomotion Level;Transfers;Bed Mobility;Stand;Stairs;Sleep;Squat;Bend    Examination-Participation Restrictions Driving;Medication Management    Stability/Clinical Decision Making Unstable/Unpredictable    Rehab Potential Fair    PT Frequency 2x / week    PT Duration 12 weeks    PT Treatment/Interventions ADLs/Self Care Home Management;Canalith Repostioning;Moist Heat;Electrical Stimulation;Gait training;Stair training;Functional mobility training;Therapeutic activities;Therapeutic exercise;Balance training;Neuromuscular re-education;Patient/family education;Manual techniques;Passive range of motion;Vestibular    PT Next Visit Plan Continue with functional mobility training and strengthening;    PT Home Exercise Plan Medbridge Access Code: [email or web address]    Consulted and Agree with Plan of Care Patient;Family member/caregiver    Family Member Consulted Wife             Patient will benefit from skilled  therapeutic intervention in order to improve the following deficits and impairments:  Decreased activity tolerance, Decreased balance, Decreased cognition, Decreased mobility, Difficulty walking, Abnormal gait, Decreased range of motion, Dizziness, Pain, Impaired flexibility, Decreased strength, Impaired sensation, Decreased endurance, Decreased safety awareness, Postural dysfunction, Improper body mechanics, Impaired UE functional use, Impaired vision/preception  Visit Diagnosis: Difficulty in walking, not elsewhere classified  Muscle weakness (generalized)  Unsteadiness on feet     Problem List Patient Active Problem List   Diagnosis Date Noted   [name] (coronary artery disease) 10/21/2021   Degenerative spondylolisthesis  03/25/2020   Physical deconditioning 03/04/2020   Lobar pneumonia, unspecified organism ([place]) 03/04/2020   Intractable hemiplegic migraine with status migrainosus 03/02/2020   Current tobacco use 02/05/2020   Abnormal findings on diagnostic imaging of lung 02/05/2020   Shortness of breath 02/05/2020   Herniated disc, cervical 01/23/2020   Cervical spondylosis with myelopathy and radiculopathy 01/23/2020   Pre-operative respiratory examination 01/12/2020   Weakness on right side of face 12/22/2019   Osteoarthritis of spine with radiculopathy, lumbar region 12/11/2019   Intractable migraine with aura with status migrainosus 11/09/2019   Former smoker 08/27/2018   Benign essential HTN 08/27/2018   Cough productive of purulent sputum 11/08/2017   GERD (gastroesophageal reflux disease) 05/06/2017   Contusion of hand 04/22/2017   BP (high blood pressure) 04/22/2017   Oxygen desaturation 04/22/2017   Prostate lump 04/22/2017   Unstable angina ([place]) 04/27/2016   Chest pain 04/24/2016   Morbid obesity ([place]) 01/29/2016   Elevated ALT measurement 10/30/2015   Nonspecific elevation of levels of transaminase and lactic acid dehydrogenase (ldh) 10/30/2015   Reduced libido  10/29/2015   Hyperlipidemia 08/06/2015   Anxiety 08/06/2015   Atherosclerosis of coronary artery 08/06/2015   Childhood asthma 08/06/2015   Chronic obstructive pulmonary disease ([place]) 08/06/2015   OSA (obstructive sleep apnea) 08/06/2015   Anxiety disorder 08/06/2015   Atherosclerosis of native coronary artery of native heart with stable angina pectoris ([place]) 26/33/3545   Uncomplicated asthma 62/56/3893   Janna Arch, PT, DPT  01/01/2022, 4:28 PM  [place] MAIN [facility] SERVICES [address] [place], [place], [address] Phone: [phone]   Fax:  [phone]  Name: KNOLAN SIMIEN MRN: [id number] Date of Birth: 06-20-1971

## 2022-01-05 ENCOUNTER — Ambulatory Visit: Payer: BC Managed Care – PPO

## 2022-01-08 ENCOUNTER — Ambulatory Visit: Payer: BC Managed Care – PPO

## 2022-01-12 ENCOUNTER — Ambulatory Visit: Payer: BC Managed Care – PPO

## 2022-01-15 ENCOUNTER — Other Ambulatory Visit: Payer: Self-pay

## 2022-01-15 ENCOUNTER — Ambulatory Visit: Payer: BC Managed Care – PPO | Attending: Neurology

## 2022-01-15 DIAGNOSIS — R262 Difficulty in walking, not elsewhere classified: Secondary | ICD-10-CM | POA: Diagnosis not present

## 2022-01-15 DIAGNOSIS — M6281 Muscle weakness (generalized): Secondary | ICD-10-CM | POA: Diagnosis present

## 2022-01-15 DIAGNOSIS — R2681 Unsteadiness on feet: Secondary | ICD-10-CM | POA: Diagnosis present

## 2022-01-15 NOTE — Therapy (Signed)
[place] MAIN [facility] SERVICES [address] [place], [place], [address] Phone: [phone]   Fax:  [phone]  Physical Therapy Treatment  Patient Details  Name: Cory Espinoza MRN: [id number] Date of Birth: 1971/10/16 No data recorded  Encounter Date: 01/15/2022   PT End of Session - 01/16/22 0809     Visit Number 72    Number of Visits 90    Date for PT Re-Evaluation 01/29/22    Authorization Type [employer] PPO;    Authorization Time Period 2/10 PN 12/22/21    PT Start Time 1515    PT Stop Time 1559    PT Time Calculation (min) 44 min    Equipment Utilized During Treatment Gait belt    Activity Tolerance Patient tolerated treatment well;Treatment limited secondary to medical complications (Comment)   provoked symptoms with certain activities   Behavior During Therapy WFL for tasks assessed/performed             Past Medical History:  Diagnosis Date   Allergy    Anginal pain (HCC)    Anxiety    Arthritis    lumbar spine   Asthma    Atrial fibrillation (HCC)    Back pain    Severe Lumbar Pain   CHF (congestive heart failure) (HCC)    COPD (chronic obstructive pulmonary disease) (HCC)    Restrictive lung disease   Coronary artery disease    Dyspnea    Dysrhythmia    afib   GERD (gastroesophageal reflux disease)    Headache    [name], onset October 2020, cluster headaches in the past   History of kidney stones    Hyperlipidemia    Hypertension    Myocardial infarction ([place])    at age 32   Pneumonia    Restrictive lung disease    Sleep apnea    unable to use cpap since onset of migraines    Past Surgical History:  Procedure Laterality Date   ABDOMINAL EXPOSURE N/A 03/25/2020   Procedure: ABDOMINAL EXPOSURE;  Surgeon: Rosetta Posner, MD;  Location: [facility] OR;  Service: Vascular;  Laterality: N/A;   ANTERIOR CERVICAL DECOMP/DISCECTOMY FUSION N/A 01/23/2020   Procedure: ANTERIOR CERVICAL DECOMPRESSION FUSION -  CERVICAL SIX-CERVICAL SEVEN;  Surgeon: Earnie Larsson, MD;  Location: [place];  Service: Neurosurgery;  Laterality: N/A;   ANTERIOR LUMBAR FUSION N/A 03/25/2020   Procedure: ANTERIOR LUMBAR INTERBODY FUSION LUMBAR FIVE-SACRAL ONE.;  Surgeon: Earnie Larsson, MD;  Location: [place];  Service: Neurosurgery;  Laterality: N/A;  anterior   BACK SURGERY  1996   CARDIAC CATHETERIZATION Left 04/27/2016   Procedure: Left Heart Cath and Coronary Angiography;  Surgeon: Corey Skains, MD;  Location: [place] [facility];  Service: Cardiovascular;  Laterality: Left;   CARDIAC CATHETERIZATION N/A 04/27/2016   Procedure: Intravascular Pressure Wire/FFR Study;  Surgeon: Yolonda Kida, MD;  Location: [place] [facility];  Service: Cardiovascular;  Laterality: N/A;   CATHETERIZATION OF PULMONARY ARTERY WITH RETRIEVAL OF FOREIGN BODY Bilateral 04/07/2011   heart   DEGENERATIVE SPONDYLOLISTHESIS     KNEE ARTHROSCOPY Bilateral 2004   LUMBAR DISC SURGERY  1999   RADICULOPATHY, CERVICAL REGION     TONSILLECTOMY      There were no vitals filed for this visit.   Subjective Assessment - 01/16/22 0808     Subjective Patient miss last week due to having COVID and the session earlier this weak due to having his stitches out.  Pertinent History Pt has a complex medical history. He reports that he fell during a [employer] training in [place] in the fall of 2020. He states that he fell 10-12 feet and struck his head, neck, and back. He was able to finish the course which took him approximately 45 minutes more to complete. He does endorse a second fall while finishing the course but did not suffer a second head injury. He states that he started getting headaches that day which have persisted since that time. 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 He does report improvement in his headaches recently. Neurology was concerned about possible BPPV and have referred  him for a vestibular evaluation. In addition since the injury, pt developed RUE and RLE pain and weakness. Pt states that NCV showed abnormal nerve conduction in RUE. He has since undergone a C7-7 ACDF on 01/23/20. He reports initial improvement in RUE strength, but states that he has a gradual return of his RUE weakness. He was also having significant RLE weakness and pain and underwent and L5-S1 anterior lumbar interbody fusion on 03/25/20. Patient reports that he does not have any back precautions but needs to wear the low back brace for one more month. Patient reports he has a follow-up appointment with the surgeon next month. Patient reports he has a follow-up appointment with Dr. Brigitte Pulse, neurologist, in August. He arrives to therapy ambulating with an upright rollator walker. Pt denies any mention of concussion after his injury. He has been having cognitive issues since his injury and has previously had a heavy metals screen as well as neurocognitive testing. He has had an MRI of his brain with and without contrast on 12/18/19. Results were mildly motion degraded examination. No evidence of acute intracranial abnormality. Minimal chronic small vessel ischemic disease. He has tremor in both his hands. He has a positive family history of Parkinson's disease in his grandfather. He is complaining of constant dizziness and unsteadiness which are worse with activity. Patient states that he frequently loses his balance and his wife has to steady him.    Limitations Lifting;Standing;Walking;House hold activities    Diagnostic tests He has had an MRI of his brain with and without contrast on 12/18/19. Results were mildly motion degraded examination. No evidence of acute intracranial abnormality. Minimal chronic small vessel ischemic disease.    Patient Stated Goals to be able to ambulate with a cane or no AD, to be able to drive, to be able to ride his motorcycle    Currently in Pain? Yes    Pain Score 7     Pain  Location Head    Pain Descriptors / Indicators Aching;Stabbing;Headache    Pain Type Chronic pain;Neuropathic pain    Pain Onset More than a month ago    Pain Frequency Intermittent             Patient had his stitches out Monday, the week prior had COVID.     Unable to perform walking tasks of progress note from previous sessions due to hand wound.    Seated:  4lb ankle weight: -LAQ 15x each LE -march 15x each LE -alternating IR/ER 15x each LE   GTB: -adduction 15x each LE -hamstring curl 15x each LE  -RUE bicep curl 12x with PT holding opposite side -RUE row 15x with PT guidance for sequencing and scap squeeze -RUE IR 12x each LE -RUE ER 12x each LE  Ambulate >600 ft with PT from back room with lights off to car upstairs with cane in non injured hand and PT holding gait belt and injured hand's elbow for stabilization, frequent tripping over cane due to being on opposite side of patient's norm.    Car transfer with assistance for set up. Min A for stabilization.      Pt educated throughout session about proper posture and technique with exercises. Improved exercise technique, movement at target joints, use of target muscles after min to mod verbal, visual, tactile cues.   Patient tolerates physical therapy session well despite recent illness of COVID and injury to dominant hand. Patient fatigues throughout session requiring intermittent rest breaks and water to reduce migraine. Migraine progressively worsens by end of session. The patient continues to benefit from additional skilled PT services to improve LE strength and balance for decreased fall risk and improved quality of life                  PT Education - 01/16/22 0809     Education Details exercise technique, body mechanics    Person(s) Educated Patient    Methods Explanation;Demonstration;Tactile cues;Verbal cues    Comprehension Verbalized understanding;Returned demonstration;Verbal cues  required;Tactile cues required              PT Short Term Goals - 12/23/21 1245       PT SHORT TERM GOAL #1   Title Patient will be independent in home exercise program to improve strength/mobility for better functional independence with ADLs and for self-management.    Baseline 3/3 HEP compliant 3/31 hep compliant    Time 6    Period Weeks    Status Achieved    Target Date 02/20/21      PT SHORT TERM GOAL #2   Title Patient will be modified independent in walking on even/uneven surface with least restrictive assistive device, for 10+ minutes without rest break, reporting some difficulty or less to improve walking tolerance with community ambulation including grocery shopping, going to church,etc.    Baseline 02/03: instability, LOB multiple times while ambulating to elevator 3/31: unable to test due to throwing up 6/20: 6 minutes 7/12: unable to test due to migraine 9/12: defer to next session 11/15: unable to test due to migraine    Time 6    Period Weeks    Status Partially Met    Target Date 12/18/21      PT SHORT TERM GOAL #3   Title Patient will increase ABC scale score >80% to demonstrate better functional mobility and better confidence with ADLs.    Baseline 01/09/21: 60.625% 3/3/: 50% 3/31: 63% 6/20: 45% 7/25: 40% 9/12: 51.1% 11/15: 48% 1/17: 40%    Time 6    Period Weeks    Status Partially Met    Target Date 12/18/21               PT Long Term Goals - 12/23/21 1245       PT LONG TERM GOAL #1   Title Patient will increase FOTO score to equal to or greater than 65 to demonstrate statistically significant improvement in mobility and quality of life.    Baseline scored 41/100 on 05/21/2020; 7/22 40, 8/23: 60/100, 09/30/20=40, 01/09/21: 46 3/3: 40.3% 3/31: 49% 6/20: 45% 7/12: 50.7% 9/12: 44.7% 11/15: 40% 1/17: 40%    Time 12    Period Weeks    Status Partially Met    Target Date 01/29/22  PT LONG TERM GOAL #2   Title Patient will reduce falls risk as  indicated by decreased TUG time to less than 11 seconds.    Baseline scored 37.9 sec with TUG on 05/21/2020; 21seconds with elevated rollator 7/22, 8/23: 13.62 sec with up and go walker, 01/09/21: 15.81s with hiking stick 3/3: 17.98 seconds one near LOB 3/31: 15.57 seconds with walking stick 6/20: 14.8 seconds with walking stick. 7/25: 19.72 seconds with walking stick. 9/26: 17.6 seconds 11/15: unable to test due to migraine 1/17: deferred due to pain    Time 12    Period Weeks    Status Partially Met    Target Date 01/29/22      PT LONG TERM GOAL #3   Title Patient will increase right UE and LE gross strength to 4+/5 throughout to improve functional strength for independent gait, increased standing tolerance and increased ADL ability.    Baseline grossly +3/5 to -4/5 right UE and LE strength on 05/21/2020; grossly 4-/5 for RUE, grossly 4-/5 for RLE on 7/22, grossly 4-/5 RUE, grossly 4/5 RLE 3/3: see note    Time 12    Period Weeks    Status Partially Met    Target Date 01/29/22      PT LONG TERM GOAL #4   Title Patient will increase 10 meter walk test to >1.61ms as to improve gait speed for better community ambulation and to reduce fall risk.    Baseline on 7/22 .57 m/sself selected, fast  .73 m/s with LOB pt reported R knee buckling, elevated rollator used, 8/23: 1.06 m/s with up and go walker, 01/09/21: 0.72 m/s with hiking stick 3/3: 0.74 m/s with walking stick 3/31: 0.81 m/s w walking stick 6/20: 1.1 m/s with walking stick    Time 12    Period Weeks    Status Achieved      PT LONG TERM GOAL #5   Title Patient will increase Berg Balance score by > 6 points to demonstrate decreased fall risk during functional activities.    Baseline 8/23: 37/56, 09/30/20=40/56, 12/20: 42/56 3/3: deferred 3/31 will perform next time; deferred due to throwing up 6/20: 46/56 7/12 38/56    Time 8    Period Weeks    Status Achieved      PT LONG TERM GOAL #6   Title Patient (< 664years old) will complete  five times sit to stand test in < 10 seconds indicating an increased LE strength and improved balance.    Baseline 8/23: 22 sec, 09/30/20=13.04 sec, 12/20: 27.05 sec, 01/09/21: 18.01s 3/3: 23 seconds no UE support 3/31; deferred due to patient throwing up 7/12: 15.2 seconds 11/15: 26 seconds 1/17: deferred due to pain    Time 12    Period Weeks    Status Partially Met    Target Date 01/29/22      PT LONG TERM GOAL #7   Title Patient will improve 6 min walk test >1000 feet with LRAD for improved gait ability in community.    Baseline 8/23: not tested; 8/30 845, goal adjusted >20045f 09/30/20=600 feet - stopped test due to R ankle catching making gait unsafe, 11/25/20= 330 feet - stopped test due to severe dizziness, vomiting/nausea, sweating making gait unsafe; 01/09/21: 720 ft with no rest breaks 3/3: deferred due to nausea 3/31: deferred due to patient throwing up 6/20: 640 ft 7/12: [id number] walking stick, no rest breaks; 9/26: 1000 ft    Time 12    Period Weeks  Status Achieved      PT LONG TERM GOAL #8   Title Patient will increase Berg Balance score by > 6 points  (52/56) to demonstrate decreased fall risk during functional activities.    Baseline 6/20: 46/56 7/12: 38/56 with migraine 9/12: defer to next session 11/15:  unable to test due to migraine 1/17: deferred due to pain    Time 12    Period Weeks    Status On-going    Target Date 01/29/22      PT LONG TERM GOAL  #9   TITLE Patient will report decreased frequency of falling per week to 1x/week for decreased fall risk and improved quality of life    Baseline 12/1: 4 falls minimum a week 1/17: 1 fall last week    Time 12    Period Weeks    Status Partially Met    Target Date 01/29/22                   Plan - 01/16/22 0810     Clinical Impression Statement Patient tolerates physical therapy session well despite recent illness of COVID and injury to dominant hand. Patient fatigues throughout session requiring  intermittent rest breaks and water to reduce migraine. Migraine progressively worsens by end of session. The patient continues to benefit from additional skilled PT services to improve LE strength and balance for decreased fall risk and improved quality of life    Personal Factors and Comorbidities Comorbidity 3+;Time since onset of injury/illness/exacerbation    Comorbidities anxiety, back pain, COPD, headache, HTN, MI, sleep apnea, migraines    Examination-Activity Limitations Locomotion Level;Transfers;Bed Mobility;Stand;Stairs;Sleep;Squat;Bend    Examination-Participation Restrictions Driving;Medication Management    Stability/Clinical Decision Making Unstable/Unpredictable    Rehab Potential Fair    PT Frequency 2x / week    PT Duration 12 weeks    PT Treatment/Interventions ADLs/Self Care Home Management;Canalith Repostioning;Moist Heat;Electrical Stimulation;Gait training;Stair training;Functional mobility training;Therapeutic activities;Therapeutic exercise;Balance training;Neuromuscular re-education;Patient/family education;Manual techniques;Passive range of motion;Vestibular    PT Next Visit Plan Continue with functional mobility training and strengthening;    PT Home Exercise Plan Medbridge Access Code: [email or web address]    Consulted and Agree with Plan of Care Patient;Family member/caregiver    Family Member Consulted Wife             Patient will benefit from skilled therapeutic intervention in order to improve the following deficits and impairments:  Decreased activity tolerance, Decreased balance, Decreased cognition, Decreased mobility, Difficulty walking, Abnormal gait, Decreased range of motion, Dizziness, Pain, Impaired flexibility, Decreased strength, Impaired sensation, Decreased endurance, Decreased safety awareness, Postural dysfunction, Improper body mechanics, Impaired UE functional use, Impaired vision/preception  Visit Diagnosis: Difficulty in walking, not elsewhere  classified  Muscle weakness (generalized)  Unsteadiness on feet     Problem List Patient Active Problem List   Diagnosis Date Noted   [name] (coronary artery disease) 10/21/2021   Degenerative spondylolisthesis 03/25/2020   Physical deconditioning 03/04/2020   Lobar pneumonia, unspecified organism ([place]) 03/04/2020   Intractable hemiplegic migraine with status migrainosus 03/02/2020   Current tobacco use 02/05/2020   Abnormal findings on diagnostic imaging of lung 02/05/2020   Shortness of breath 02/05/2020   Herniated disc, cervical 01/23/2020   Cervical spondylosis with myelopathy and radiculopathy 01/23/2020   Pre-operative respiratory examination 01/12/2020   Weakness on right side of face 12/22/2019   Osteoarthritis of spine with radiculopathy, lumbar region 12/11/2019   Intractable migraine with aura with status migrainosus 11/09/2019   Former smoker 08/27/2018  Benign essential HTN 08/27/2018   Cough productive of purulent sputum 11/08/2017   GERD (gastroesophageal reflux disease) 05/06/2017   Contusion of hand 04/22/2017   BP (high blood pressure) 04/22/2017   Oxygen desaturation 04/22/2017   Prostate lump 04/22/2017   Unstable angina ([place]) 04/27/2016   Chest pain 04/24/2016   Morbid obesity ([place]) 01/29/2016   Elevated ALT measurement 10/30/2015   Nonspecific elevation of levels of transaminase and lactic acid dehydrogenase (ldh) 10/30/2015   Reduced libido 10/29/2015   Hyperlipidemia 08/06/2015   Anxiety 08/06/2015   Atherosclerosis of coronary artery 08/06/2015   Childhood asthma 08/06/2015   Chronic obstructive pulmonary disease ([place]) 08/06/2015   OSA (obstructive sleep apnea) 08/06/2015   Anxiety disorder 08/06/2015   Atherosclerosis of native coronary artery of native heart with stable angina pectoris ([place]) 30/17/2091   Uncomplicated asthma 06/81/6619    Janna Arch, PT, DPT  01/16/2022, 8:10 AM  [place] MAIN [facility]  SERVICES [address] [place], [place], [address] Phone: [phone]   Fax:  [phone]  Name: Cory Espinoza MRN: [id number] Date of Birth: 14-Jun-1971

## 2022-01-19 ENCOUNTER — Ambulatory Visit: Payer: BC Managed Care – PPO

## 2022-01-22 ENCOUNTER — Other Ambulatory Visit: Payer: Self-pay

## 2022-01-22 ENCOUNTER — Ambulatory Visit: Payer: BC Managed Care – PPO

## 2022-01-22 DIAGNOSIS — R2681 Unsteadiness on feet: Secondary | ICD-10-CM

## 2022-01-22 DIAGNOSIS — R262 Difficulty in walking, not elsewhere classified: Secondary | ICD-10-CM

## 2022-01-22 DIAGNOSIS — M6281 Muscle weakness (generalized): Secondary | ICD-10-CM

## 2022-01-22 NOTE — Therapy (Signed)
[place] MAIN [facility] SERVICES [address] [place], [place], [address] Phone: [phone]   Fax:  [phone]  Physical Therapy Treatment  Patient Details  Name: Cory Espinoza MRN: [id number] Date of Birth: [birth year] No data recorded  Encounter Date: 01/22/2022   PT End of Session - 01/22/22 1507     Visit Number 33    Number of Visits 90    Date for PT Re-Evaluation 01/29/22    Authorization Type BCBS PPO;    Authorization Time Period 3/10 PN 12/22/21    PT Start Time 1513    PT Stop Time 1559    PT Time Calculation (min) 46 min    Equipment Utilized During Treatment Gait belt    Activity Tolerance Patient tolerated treatment well;Treatment limited secondary to medical complications (Comment)   provoked symptoms with certain activities   Behavior During Therapy WFL for tasks assessed/performed             Past Medical History:  Diagnosis Date   Allergy    Anginal pain (HCC)    Anxiety    Arthritis    lumbar spine   Asthma    Atrial fibrillation (HCC)    Back pain    Severe Lumbar Pain   CHF (congestive heart failure) (HCC)    COPD (chronic obstructive pulmonary disease) (HCC)    Restrictive lung disease   Coronary artery disease    Dyspnea    Dysrhythmia    afib   GERD (gastroesophageal reflux disease)    Headache    [name], onset October 2020, cluster headaches in the past   History of kidney stones    Hyperlipidemia    Hypertension    Myocardial infarction ([address])    at age 52   Pneumonia    Restrictive lung disease    Sleep apnea    unable to use cpap since onset of migraines    Past Surgical History:  Procedure Laterality Date   ABDOMINAL EXPOSURE N/A 03/25/2020   Procedure: ABDOMINAL EXPOSURE;  Surgeon: Rosetta Posner, MD;  Location: [facility];  Service: Vascular;  Laterality: N/A;   ANTERIOR CERVICAL DECOMP/DISCECTOMY FUSION N/A 01/23/2020   Procedure: ANTERIOR CERVICAL DECOMPRESSION FUSION -  CERVICAL SIX-CERVICAL SEVEN;  Surgeon: Earnie Larsson, MD;  Location: [place];  Service: Neurosurgery;  Laterality: N/A;   ANTERIOR LUMBAR FUSION N/A 03/25/2020   Procedure: ANTERIOR LUMBAR INTERBODY FUSION LUMBAR FIVE-SACRAL ONE.;  Surgeon: Earnie Larsson, MD;  Location: [place];  Service: Neurosurgery;  Laterality: N/A;  anterior   BACK SURGERY  1996   CARDIAC CATHETERIZATION Left 04/27/2016   Procedure: Left Heart Cath and Coronary Angiography;  Surgeon: Corey Skains, MD;  Location: [place] [facility];  Service: Cardiovascular;  Laterality: Left;   CARDIAC CATHETERIZATION N/A 04/27/2016   Procedure: Intravascular Pressure Wire/FFR Study;  Surgeon: Yolonda Kida, MD;  Location: [place] [facility];  Service: Cardiovascular;  Laterality: N/A;   CATHETERIZATION OF PULMONARY ARTERY WITH RETRIEVAL OF FOREIGN BODY Bilateral 04/07/2011   heart   DEGENERATIVE SPONDYLOLISTHESIS     KNEE ARTHROSCOPY Bilateral 2004   LUMBAR DISC SURGERY  1999   RADICULOPATHY, CERVICAL REGION     TONSILLECTOMY      There were no vitals filed for this visit.   Subjective Assessment - 01/22/22 1706     Subjective Patient is leaving for [place] for the weekend for a memorial of life event. Has an infection in his hand.  Pertinent History Pt has a complex medical history. 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 He states that he fell 10-12 feet and struck his head, neck, and back. He was able to finish the course which took him approximately 45 minutes more to complete. He does endorse a second fall while finishing the course but did not suffer a second head injury. He states that he started getting headaches that day which have persisted since that time. 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 He does report improvement in his headaches recently. Neurology was concerned about possible BPPV and have referred him  for a vestibular evaluation. In addition since the injury, pt developed RUE and RLE pain and weakness. Pt states that NCV showed abnormal nerve conduction in RUE. He has since undergone a C7-7 ACDF on 01/23/20. He reports initial improvement in RUE strength, but states that he has a gradual return of his RUE weakness. He was also having significant RLE weakness and pain and underwent and L5-S1 anterior lumbar interbody fusion on 03/25/20. Patient reports that he does not have any back precautions but needs to wear the low back brace for one more month. Patient reports he has a follow-up appointment with the surgeon next month. Patient reports he has a follow-up appointment with Dr. Brigitte Pulse, neurologist, in August. He arrives to therapy ambulating with an upright rollator walker. Pt denies any mention of concussion after his injury. He has been having cognitive issues since his injury and has previously had a heavy metals screen as well as neurocognitive testing. He has had an MRI of his brain with and without contrast on 12/18/19. Results were mildly motion degraded examination. No evidence of acute intracranial abnormality. Minimal chronic small vessel ischemic disease. He has tremor in both his hands. He has a positive family history of Parkinson's disease in his grandfather. He is complaining of constant dizziness and unsteadiness which are worse with activity. Patient states that he frequently loses his balance and his wife has to steady him.    Limitations Lifting;Standing;Walking;House hold activities    Diagnostic tests He has had an MRI of his brain with and without contrast on 12/18/19. Results were mildly motion degraded examination. No evidence of acute intracranial abnormality. Minimal chronic small vessel ischemic disease.    Patient Stated Goals to be able to ambulate with a cane or no AD, to be able to drive, to be able to ride his motorcycle    Currently in Pain? Yes    Pain Score 6     Pain Location  Head    Pain Orientation Mid    Pain Descriptors / Indicators Aching    Pain Type Chronic pain    Pain Onset More than a month ago    Pain Frequency Intermittent               TREATMENT   Seated:  4lb ankle weight: -LAQ 15x each LE -march 15x each LE -alternating IR/ER 15x each LE   GTB: -adduction 15x each LE -hamstring curl 15x each LE  -RUE bicep curl 12x with PT holding opposite side -RUE row 15x with PT guidance for sequencing and scap squeeze -RUE IR 12x each LE -RUE ER 12x each LE   Hamstring isometric pressing into dynadisc 15x 3 second holds each LE   STM with implementation of effleurage and ptrissage to bilateral  upper traps and cervical paraspinals x 8 minutes    Ambulate >600 ft with PT from back room with lights off to car upstairs with cane in non injured hand and PT holding gait belt and injured hand's elbow for stabilization, frequent tripping over cane due to being on opposite side of patient's norm.   Patient tolerates physical therapy session despite recent infection in wound causing increased pain and limited use of AD. Patient does have pain throughout session but is able to perform strengthening interventions with minimal increase. He does continue to require assistance with ambulation up to car due to limited mobility with AD s/p injury to dominant hand.The patient continues to benefit from additional skilled PT services to improve LE strength and balance for decreased fall risk and improved quality of life                       PT Education - 01/22/22 1506     Education Details exercise technique, body mechanics    Person(s) Educated Patient    Methods Explanation;Demonstration;Tactile cues;Verbal cues    Comprehension Verbalized understanding;Returned demonstration;Verbal cues required;Tactile cues required              PT Short Term Goals - 12/23/21 1245       PT SHORT TERM GOAL #1   Title Patient will be  independent in home exercise program to improve strength/mobility for better functional independence with ADLs and for self-management.    Baseline 3/3 HEP compliant 3/31 hep compliant    Time 6    Period Weeks    Status Achieved    Target Date 02/20/21      PT SHORT TERM GOAL #2   Title Patient will be modified independent in walking on even/uneven surface with least restrictive assistive device, for 10+ minutes without rest break, reporting some difficulty or less to improve walking tolerance with community ambulation including grocery shopping, going to church,etc.    Baseline 02/03: instability, LOB multiple times while ambulating to elevator 3/31: unable to test due to throwing up 6/20: 6 minutes 7/12: unable to test due to migraine 9/12: defer to next session 11/15: unable to test due to migraine    Time 6    Period Weeks    Status Partially Met    Target Date 12/18/21      PT SHORT TERM GOAL #3   Title Patient will increase ABC scale score >80% to demonstrate better functional mobility and better confidence with ADLs.    Baseline 01/09/21: 60.625% 3/3/: 50% 3/31: 63% 6/20: 45% 7/25: 40% 9/12: 51.1% 11/15: 48% 1/17: 40%    Time 6    Period Weeks    Status Partially Met    Target Date 12/18/21               PT Long Term Goals - 12/23/21 1245       PT LONG TERM GOAL #1   Title Patient will increase FOTO score to equal to or greater than 65 to demonstrate statistically significant improvement in mobility and quality of life.    Baseline scored 41/100 on 05/21/2020; 7/22 40, 8/23: 60/100, 09/30/20=40, 01/09/21: 46 3/3: 40.3% 3/31: 49% 6/20: 45% 7/12: 50.7% 9/12: 44.7% 11/15: 40% 1/17: 40%    Time 12    Period Weeks    Status Partially Met    Target Date 01/29/22      PT LONG TERM GOAL #2   Title Patient will reduce falls risk  as indicated by decreased TUG time to less than 11 seconds.    Baseline scored 37.9 sec with TUG on 05/21/2020; 21seconds with elevated rollator 7/22,  8/23: 13.62 sec with up and go walker, 01/09/21: 15.81s with hiking stick 3/3: 17.98 seconds one near LOB 3/31: 15.57 seconds with walking stick 6/20: 14.8 seconds with walking stick. 7/25: 19.72 seconds with walking stick. 9/26: 17.6 seconds 11/15: unable to test due to migraine 1/17: deferred due to pain    Time 12    Period Weeks    Status Partially Met    Target Date 01/29/22      PT LONG TERM GOAL #3   Title Patient will increase right UE and LE gross strength to 4+/5 throughout to improve functional strength for independent gait, increased standing tolerance and increased ADL ability.    Baseline grossly +3/5 to -4/5 right UE and LE strength on 05/21/2020; grossly 4-/5 for RUE, grossly 4-/5 for RLE on 7/22, grossly 4-/5 RUE, grossly 4/5 RLE 3/3: see note    Time 12    Period Weeks    Status Partially Met    Target Date 01/29/22      PT LONG TERM GOAL #4   Title Patient will increase 10 meter walk test to >1.23ms as to improve gait speed for better community ambulation and to reduce fall risk.    Baseline on 7/22 .57 m/sself selected, fast  .73 m/s with LOB pt reported R knee buckling, elevated rollator used, 8/23: 1.06 m/s with up and go walker, 01/09/21: 0.72 m/s with hiking stick 3/3: 0.74 m/s with walking stick 3/31: 0.81 m/s w walking stick 6/20: 1.1 m/s with walking stick    Time 12    Period Weeks    Status Achieved      PT LONG TERM GOAL #5   Title Patient will increase Berg Balance score by > 6 points to demonstrate decreased fall risk during functional activities.    Baseline 8/23: 37/56, 09/30/20=40/56, 12/20: 42/56 3/3: deferred 3/31 will perform next time; deferred due to throwing up 6/20: 46/56 7/12 38/56    Time 8    Period Weeks    Status Achieved      PT LONG TERM GOAL #6   Title Patient (< 679years old) will complete five times sit to stand test in < 10 seconds indicating an increased LE strength and improved balance.    Baseline 8/23: 22 sec, 09/30/20=13.04 sec,  12/20: 27.05 sec, 01/09/21: 18.01s 3/3: 23 seconds no UE support 3/31; deferred due to patient throwing up 7/12: 15.2 seconds 11/15: 26 seconds 1/17: deferred due to pain    Time 12    Period Weeks    Status Partially Met    Target Date 01/29/22      PT LONG TERM GOAL #7   Title Patient will improve 6 min walk test >1000 feet with LRAD for improved gait ability in community.    Baseline 8/23: not tested; 8/30 845, goal adjusted >20058f 09/30/20=600 feet - stopped test due to R ankle catching making gait unsafe, 11/25/20= 330 feet - stopped test due to severe dizziness, vomiting/nausea, sweating making gait unsafe; 01/09/21: 720 ft with no rest breaks 3/3: deferred due to nausea 3/31: deferred due to patient throwing up 6/20: 640 ft 7/12: [id number] walking stick, no rest breaks; 9/26: 1000 ft    Time 12    Period Weeks    Status Achieved      PT LONG TERM GOAL #  8   Title Patient will increase Berg Balance score by > 6 points  (52/56) to demonstrate decreased fall risk during functional activities.    Baseline 6/20: 46/56 7/12: 38/56 with migraine 9/12: defer to next session 11/15:  unable to test due to migraine 1/17: deferred due to pain    Time 12    Period Weeks    Status On-going    Target Date 01/29/22      PT LONG TERM GOAL  #9   TITLE Patient will report decreased frequency of falling per week to 1x/week for decreased fall risk and improved quality of life    Baseline 12/1: 4 falls minimum a week 1/17: 1 fall last week    Time 12    Period Weeks    Status Partially Met    Target Date 01/29/22                   Plan - 01/22/22 1712     Clinical Impression Statement Patient tolerates physical therapy session despite recent infection in wound causing increased pain and limited use of AD. Patient does have pain throughout session but is able to perform strengthening interventions with minimal increase. He does continue to require assistance with ambulation up to car due  to limited mobility with AD s/p injury to dominant hand.The patient continues to benefit from additional skilled PT services to improve LE strength and balance for decreased fall risk and improved quality of life    Personal Factors and Comorbidities Comorbidity 3+;Time since onset of injury/illness/exacerbation    Comorbidities anxiety, back pain, COPD, headache, HTN, MI, sleep apnea, migraines    Examination-Activity Limitations Locomotion Level;Transfers;Bed Mobility;Stand;Stairs;Sleep;Squat;Bend    Examination-Participation Restrictions Driving;Medication Management    Stability/Clinical Decision Making Unstable/Unpredictable    Rehab Potential Fair    PT Frequency 2x / week    PT Duration 12 weeks    PT Treatment/Interventions ADLs/Self Care Home Management;Canalith Repostioning;Moist Heat;Electrical Stimulation;Gait training;Stair training;Functional mobility training;Therapeutic activities;Therapeutic exercise;Balance training;Neuromuscular re-education;Patient/family education;Manual techniques;Passive range of motion;Vestibular    PT Next Visit Plan Continue with functional mobility training and strengthening;    PT Home Exercise Plan Medbridge Access Code: [email or web address]    Consulted and Agree with Plan of Care Patient;Family member/caregiver    Family Member Consulted Wife             Patient will benefit from skilled therapeutic intervention in order to improve the following deficits and impairments:  Decreased activity tolerance, Decreased balance, Decreased cognition, Decreased mobility, Difficulty walking, Abnormal gait, Decreased range of motion, Dizziness, Pain, Impaired flexibility, Decreased strength, Impaired sensation, Decreased endurance, Decreased safety awareness, Postural dysfunction, Improper body mechanics, Impaired UE functional use, Impaired vision/preception  Visit Diagnosis: Difficulty in walking, not elsewhere classified  Unsteadiness on feet  Muscle weakness  (generalized)     Problem List Patient Active Problem List   Diagnosis Date Noted   [name] (coronary artery disease) 10/21/2021   Degenerative spondylolisthesis 03/25/2020   Physical deconditioning 03/04/2020   Lobar pneumonia, unspecified organism ([place]) 03/04/2020   Intractable hemiplegic migraine with status migrainosus 03/02/2020   Current tobacco use 02/05/2020   Abnormal findings on diagnostic imaging of lung 02/05/2020   Shortness of breath 02/05/2020   Herniated disc, cervical 01/23/2020   Cervical spondylosis with myelopathy and radiculopathy 01/23/2020   Pre-operative respiratory examination 01/12/2020   Weakness on right side of face 12/22/2019   Osteoarthritis of spine with radiculopathy, lumbar region 12/11/2019   Intractable migraine with aura with status  migrainosus 11/09/2019   Former smoker 08/27/2018   Benign essential HTN 08/27/2018   Cough productive of purulent sputum 11/08/2017   GERD (gastroesophageal reflux disease) 05/06/2017   Contusion of hand 04/22/2017   BP (high blood pressure) 04/22/2017   Oxygen desaturation 04/22/2017   Prostate lump 04/22/2017   Unstable angina ([place]) 04/27/2016   Chest pain 04/24/2016   Morbid obesity ([place]) 01/29/2016   Elevated ALT measurement 10/30/2015   Nonspecific elevation of levels of transaminase and lactic acid dehydrogenase (ldh) 10/30/2015   Reduced libido 10/29/2015   Hyperlipidemia 08/06/2015   Anxiety 08/06/2015   Atherosclerosis of coronary artery 08/06/2015   Childhood asthma 08/06/2015   Chronic obstructive pulmonary disease ([place]) 08/06/2015   OSA (obstructive sleep apnea) 08/06/2015   Anxiety disorder 08/06/2015   Atherosclerosis of native coronary artery of native heart with stable angina pectoris ([place]) 74/82/7078   Uncomplicated asthma 67/54/4920    Janna Arch, PT, DPT  01/22/2022, 5:13 PM  [place] MAIN [facility] SERVICES [address] [place], [place],  [address] Phone: [phone]   Fax:  [phone]  Name: Cory Espinoza MRN: [id number] Date of Birth: 07/13/1971

## 2022-01-26 ENCOUNTER — Ambulatory Visit: Payer: BC Managed Care – PPO

## 2022-01-28 ENCOUNTER — Emergency Department (HOSPITAL_COMMUNITY)
Admission: EM | Admit: 2022-01-28 | Discharge: 2022-01-28 | Disposition: A | Discharge status: Home / Self Care | Payer: BC Managed Care – PPO | Attending: Emergency Medicine | Admitting: Emergency Medicine

## 2022-01-28 ENCOUNTER — Other Ambulatory Visit: Payer: Self-pay

## 2022-01-28 ENCOUNTER — Encounter (HOSPITAL_COMMUNITY): Payer: Self-pay

## 2022-01-28 DIAGNOSIS — S61412S Laceration without foreign body of left hand, sequela: Secondary | ICD-10-CM

## 2022-01-28 DIAGNOSIS — S6992XA Unspecified injury of left wrist, hand and finger(s), initial encounter: Secondary | ICD-10-CM | POA: Diagnosis present

## 2022-01-28 DIAGNOSIS — Z7951 Long term (current) use of inhaled steroids: Secondary | ICD-10-CM | POA: Diagnosis not present

## 2022-01-28 DIAGNOSIS — S61412A Laceration without foreign body of left hand, initial encounter: Secondary | ICD-10-CM | POA: Diagnosis not present

## 2022-01-28 DIAGNOSIS — W228XXA Striking against or struck by other objects, initial encounter: Secondary | ICD-10-CM | POA: Diagnosis not present

## 2022-01-28 LAB — CBC WITH DIFFERENTIAL/PLATELET
Abs Immature Granulocytes: 0.01 10*3/uL (ref 0.00–0.07)
Basophils Absolute: 0 10*3/uL (ref 0.0–0.1)
Basophils Relative: 1 %
Eosinophils Absolute: 0.1 10*3/uL (ref 0.0–0.5)
Eosinophils Relative: 1 %
HCT: 44.8 % (ref 39.0–52.0)
Hemoglobin: 14.6 g/dL (ref 13.0–17.0)
Immature Granulocytes: 0 %
Lymphocytes Relative: 29 %
Lymphs Abs: 1.4 10*3/uL (ref 0.7–4.0)
MCH: 31.7 pg (ref 26.0–34.0)
MCHC: 32.6 g/dL (ref 30.0–36.0)
MCV: 97.2 fL (ref 80.0–100.0)
Monocytes Absolute: 0.4 10*3/uL (ref 0.1–1.0)
Monocytes Relative: 7 %
Neutro Abs: 3.1 10*3/uL (ref 1.7–7.7)
Neutrophils Relative %: 62 %
Platelets: 189 10*3/uL (ref 150–400)
RBC: 4.61 MIL/uL (ref 4.22–5.81)
RDW: 13.3 % (ref 11.5–15.5)
WBC: 5 10*3/uL (ref 4.0–10.5)
nRBC: 0 % (ref 0.0–0.2)

## 2022-01-28 LAB — COMPREHENSIVE METABOLIC PANEL
ALT: 62 U/L — ABNORMAL HIGH (ref 0–44)
AST: 39 U/L (ref 15–41)
Albumin: 4 g/dL (ref 3.5–5.0)
Alkaline Phosphatase: 101 U/L (ref 38–126)
Anion gap: 10 (ref 5–15)
BUN: 10 mg/dL (ref 6–20)
CO2: 22 mmol/L (ref 22–32)
Calcium: 8.7 mg/dL — ABNORMAL LOW (ref 8.9–10.3)
Chloride: 106 mmol/L (ref 98–111)
Creatinine, Ser: 0.79 mg/dL (ref 0.61–1.24)
GFR, Estimated: >60 mL/min (ref >60)
Glucose, Bld: 117 mg/dL — ABNORMAL HIGH (ref 70–99)
Potassium: 3.8 mmol/L (ref 3.5–5.1)
Sodium: 138 mmol/L (ref 135–145)
Total Bilirubin: 0.5 mg/dL (ref 0.3–1.2)
Total Protein: 6.6 g/dL (ref 6.5–8.1)

## 2022-01-28 LAB — PROTIME-INR
INR: 0.9 (ref 0.8–1.2)
Prothrombin Time: 12.6 seconds (ref 11.4–15.2)

## 2022-01-28 NOTE — ED Triage Notes (Signed)
Pt bib ems from [facility]. Pt has a repaired laceration that reopened and the artery started bleeding. Pt placed tourniquet on arm since 1150.

## 2022-01-28 NOTE — ED Notes (Addendum)
Pt had a laceration repair January 20th at [facility] hospital. Pt had stitches applied to stop the artery bleeding in his left hand. Pt seen a hand specialist  that informed pt to soak his hand in epsom salt 2-3x. Pt was sitting on bed and noticed blood trickling down hand and artery started bleeding. Pt applied a tourniquet at home at 1150.  EMS stated pt lost about 500 cc of blood.  En route pt was given 100 mcg Fentanyl and 4 mg Zofran. 500 cc of Normal Saline at a slow drip.  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 Pt stated he is not the best historian and the spouse Chaya Jan can attest all medical hx and decisions if necessary.   BP 160/90 HR 98 RA 96% RR 15  CBG 113

## 2022-01-28 NOTE — ED Notes (Signed)
Pt verbalizes understanding of discharge instructions. Opportunity for questions and answers were provided. Pt discharged from the ED.   ?

## 2022-01-28 NOTE — Progress Notes (Signed)
Orthopedic Tech Progress Note Patient Details:  Cory Espinoza 1971/12/03 [id number]  I was given a verbal order to apply a HIGH VOLAR   Ortho Devices Type of Ortho Device: Ace wrap, Cotton web roll, Volar splint Ortho Device/Splint Location: LUE Ortho Device/Splint Interventions: Ordered, Application, Adjustment   Post Interventions Patient Tolerated: Well Instructions Provided: Care of device  Cory Espinoza 01/28/2022, 3:48 PM

## 2022-01-28 NOTE — ED Notes (Signed)
Pt had tourniquet applied by himself at 1150. Tourniquet was removed 1319 by CN. Laceration site controlled bleeding. Cleaned left hand and applied pressure dressing.

## 2022-01-28 NOTE — ED Notes (Incomplete)
RN removed coban wrap prior to splint being applied.

## 2022-01-28 NOTE — ED Notes (Signed)
Pt provided emesis bag

## 2022-01-28 NOTE — Discharge Instructions (Signed)
Follow up with hand Surgery tomorrow as scheduled.  Leave dressing and splint in place

## 2022-01-28 NOTE — ED Notes (Signed)
Ortho Technician notified to apply splint

## 2022-01-28 NOTE — ED Provider Notes (Signed)
[facility] EMERGENCY DEPARTMENT Provider Note   CSN: [id number] Arrival date & time: 01/28/22  1305     History  Chief Complaint  Patient presents with   Extremity Laceration    Cory Espinoza is a 51 y.o. male.  Pt cut his left hand using a Dremel on 1/20.  Patient had sutures at [facility].  Patient was told he hit a artery.  Patient had a a lot of bleeding at the time he states that they put a suture in the artery.  Patient reports he followed up with hand surgery and was told that everything would heal.  Patient reports the wound has opened up and he began bleeding today.  EMS a applied a tourniquet due to to blood loss.  Patient reports he is supposed to see the hand surgeon tomorrow for recheck.  The history is provided by the patient. No language interpreter was used.      Home Medications Prior to Admission medications   Medication Sig Start Date End Date Taking? Authorizing Provider  albuterol (PROVENTIL HFA;VENTOLIN HFA) 108 (90 Base) MCG/ACT inhaler Inhale 1-2 puffs into the lungs every 4 (four) hours as needed for wheezing or shortness of breath. 06/03/18   Flora Lipps, MD  ALPRAZolam Duanne Moron) 1 MG tablet Take 1 mg by mouth 3 (three) times daily. 07/07/21   [provider]  atorvastatin (LIPITOR) 40 MG tablet Take 1 tablet (40 mg total) by mouth at bedtime. 02/04/18   Arnetha Courser, MD  B COMPLEX VITAMINS PO Take 1 tablet by mouth daily.     [provider]  baclofen (LIORESAL) 10 MG tablet Take 10 mg by mouth 2 (two) times daily as needed for muscle spasms.    [provider]  benzonatate (TESSALON) 200 MG capsule Take 1 capsule (200 mg total) by mouth 3 (three) times daily as needed for cough. 02/05/20   Lauraine Rinne, NP  calcium carbonate (OS-CAL - DOSED IN MG OF ELEMENTAL CALCIUM) 1250 (500 Ca) MG tablet Take by mouth.    [provider]  cetirizine (ZYRTEC) 10 MG tablet TAKE 1 TABLET(10 MG) BY MOUTH DAILY  04/24/19   Flora Lipps, MD  Cholecalciferol (VITAMIN D) 125 MCG (5000 UT) CAPS Take 5,000 Units by mouth daily.    [provider]  clopidogrel (PLAVIX) 75 MG tablet Take 1 tablet (75 mg total) by mouth daily. 02/04/18   Lada, Satira Anis, MD  DULoxetine (CYMBALTA) 60 MG capsule Take 60 mg by mouth daily. 08/04/21   [provider]  EMGALITY 120 MG/ML SOAJ SMARTSIG:1 Milliliter(s) SUB-Q Every 4 Weeks 02/07/21   [provider]  escitalopram (LEXAPRO) 20 MG tablet Take 20 mg by mouth at bedtime.     [provider]  ezetimibe (ZETIA) 10 MG tablet Take 1 tablet (10 mg total) by mouth daily. 10/21/21   Minna Merritts, MD  fluticasone (FLONASE) 50 MCG/ACT nasal spray Place into the nose.    [provider]  ipratropium-albuterol (DUONEB) 0.5-2.5 (3) MG/3ML SOLN Take 3 mLs by nebulization every 4 (four) hours as needed. DX: J44.9 COPD 02/05/20   Lauraine Rinne, NP  magnesium oxide (MAG-OX) 400 MG tablet Take 400 mg by mouth daily.     [provider]  Melatonin 10 MG TABS Take 30 mg by mouth at bedtime.    [provider]  naratriptan (AMERGE) 2.5 MG tablet TAKE 1 TABLET(2.5 MG) BY MOUTH 1 TIME FOR UP TO 1 DOSE  AS NEEDED FOR MIGRAINE. MAY TAKE A SECOND DOSE AFTER 4 HOURS AS NEEDED 10/21/21   [provider]  nitroGLYCERIN (NITROSTAT) 0.4 MG SL tablet Place 1 tablet (0.4 mg total) under the tongue every 5 (five) minutes as needed for chest pain. 09/29/21   Minna Merritts, MD  NURTEC 75 MG TBDP Place 75 mg under the tongue daily as needed (migraine).  01/10/20   [provider]  OXcarbazepine ER (OXTELLAR XR) 600 MG TB24 TAKE 2 TABLETS(1200 MG) BY MOUTH EVERY DAY 09/15/21   [provider]  pantoprazole (PROTONIX) 40 MG tablet TAKE 1 TABLET(40 MG) BY MOUTH DAILY 01/27/19   Flora Lipps, MD  Potassium 99 MG TABS Take 99 mg by mouth daily.     [provider]  pregabalin (LYRICA) 100 MG capsule Take 100 mg by mouth 3  (three) times daily. 09/11/20 09/11/21  [provider]  pregabalin (LYRICA) 200 MG capsule Take 200 mg by mouth 3 (three) times daily. 09/23/21   [provider]  Probiotic Product (PROBIOTIC DAILY PO) Take 1 tablet by mouth daily.    [provider]  promethazine (PHENERGAN) 25 MG tablet Take 25 mg by mouth daily.    [provider]  pyridOXINE (VITAMIN B-6) 100 MG tablet Take 100 mg by mouth daily.     [provider]  Saw Palmetto 450 MG CAPS Take 900 mg by mouth daily.     [provider]  Tiotropium Bromide Monohydrate (SPIRIVA RESPIMAT) 2.5 MCG/ACT AERS Inhale 2 puffs into the lungs daily. 03/04/20   Lauraine Rinne, NP  zinc sulfate 220 (50 Zn) MG capsule Take by mouth.    [provider]  zonisamide (ZONEGRAN) 100 MG capsule Take by mouth.    [provider]      Allergies    Aspirin, Amoxicillin, Codeine, Penicillins, Cefdinir, and Hydrocodone-acetaminophen    Review of Systems   Review of Systems  All other systems reviewed and are negative.  Physical Exam Updated Vital Signs BP 124/80    Pulse 71    Temp 98.5 F (36.9 C) (Oral)    Resp 15    Ht 6' (1.829 m)    Wt 116.6 kg    SpO2 93%    BMI 34.86 kg/m  Physical Exam Vitals reviewed.  Constitutional:      Appearance: Normal appearance.  Cardiovascular:     Rate and Rhythm: Normal rate.  Pulmonary:     Effort: Pulmonary effort is normal.  Musculoskeletal:     Comments: Healing laceration left mid palm,  hematoma mid hand with 1cm open area, from with pain  nv and ns intact   Skin:    General: Skin is warm.  Neurological:     General: No focal deficit present.     Mental Status: He is alert.  Psychiatric:        Mood and Affect: Mood normal.    ED Results / Procedures / Treatments   Labs (all labs ordered are listed, but only abnormal results are displayed) Labs Reviewed  COMPREHENSIVE METABOLIC PANEL - Abnormal; Notable for the following  components:      Result Value   Glucose, Bld 117 (*)    Calcium 8.7 (*)    ALT 62 (*)    All other components within normal limits  CBC WITH DIFFERENTIAL/PLATELET  PROTIME-INR    EKG None  Radiology No results found.  Procedures Procedures    Medications Ordered in ED Medications -  No data to display  ED Course/ Medical Decision Making/ A&P                           Medical Decision Making Amount and/or Complexity of Data Reviewed Independent Historian: spouse and EMS    Details: EMS reports large amount of bleeding External Data Reviewed: notes.    Details: ED notes,  Orthopaedic notes not available Labs: ordered. Decision-making details documented in ED Course.    Details: Hemoglobin is normal  liver functions normal  Risk OTC drugs.   Dressing applied, no further bleeding,  labs are normal.   I will splint pt to keep him from moving so that area does not bleed.         Final Clinical Impression(s) / ED Diagnoses Final diagnoses:  Laceration of left hand without foreign body, sequela    Rx / DC Orders ED Discharge Orders     None      An After Visit Summary was printed and given to the patient.    Fransico Meadow, [place] 01/28/22 1533    Isla Pence, MD 01/28/22 [phone]

## 2022-01-29 ENCOUNTER — Ambulatory Visit: Payer: BC Managed Care – PPO

## 2022-02-02 ENCOUNTER — Ambulatory Visit: Payer: BC Managed Care – PPO

## 2022-02-05 ENCOUNTER — Other Ambulatory Visit: Payer: Self-pay

## 2022-02-05 ENCOUNTER — Ambulatory Visit: Payer: BC Managed Care – PPO | Attending: Neurology

## 2022-02-05 DIAGNOSIS — M6281 Muscle weakness (generalized): Secondary | ICD-10-CM | POA: Diagnosis present

## 2022-02-05 DIAGNOSIS — R2681 Unsteadiness on feet: Secondary | ICD-10-CM | POA: Diagnosis present

## 2022-02-05 DIAGNOSIS — R262 Difficulty in walking, not elsewhere classified: Secondary | ICD-10-CM | POA: Insufficient documentation

## 2022-02-05 NOTE — Therapy (Signed)
[place] MAIN [facility] SERVICES [address] [place], [place], [address] Phone: [phone]   Fax:  [phone]  Physical Therapy Treatment  Patient Details  Name: Cory Espinoza MRN: [id number] Date of Birth: 21-Apr-1971 No data recorded  Encounter Date: 02/05/2022   PT End of Session - 02/05/22 1642     Visit Number 43    Number of Visits 47    Date for PT Re-Evaluation 04/30/22    Authorization Type BCBS PPO;    Authorization Time Period 4/10 PN 12/22/21    PT Start Time 1515    PT Stop Time 1600    PT Time Calculation (min) 45 min    Equipment Utilized During Treatment Gait belt    Activity Tolerance Patient tolerated treatment well;Treatment limited secondary to medical complications (Comment)   provoked symptoms with certain activities   Behavior During Therapy WFL for tasks assessed/performed             Past Medical History:  Diagnosis Date   Allergy    Anginal pain (HCC)    Anxiety    Arthritis    lumbar spine   Asthma    Atrial fibrillation (HCC)    Back pain    Severe Lumbar Pain   CHF (congestive heart failure) (HCC)    COPD (chronic obstructive pulmonary disease) (HCC)    Restrictive lung disease   Coronary artery disease    Dyspnea    Dysrhythmia    afib   GERD (gastroesophageal reflux disease)    Headache    [name], onset October 2020, cluster headaches in the past   History of kidney stones    Hyperlipidemia    Hypertension    Myocardial infarction ([place])    at age 34   Pneumonia    Restrictive lung disease    Sleep apnea    unable to use cpap since onset of migraines    Past Surgical History:  Procedure Laterality Date   ABDOMINAL EXPOSURE N/A 03/25/2020   Procedure: ABDOMINAL EXPOSURE;  Surgeon: Rosetta Posner, MD;  Location: [facility] OR;  Service: Vascular;  Laterality: N/A;   ANTERIOR CERVICAL DECOMP/DISCECTOMY FUSION N/A 01/23/2020   Procedure: ANTERIOR CERVICAL DECOMPRESSION FUSION -  CERVICAL SIX-CERVICAL SEVEN;  Surgeon: Earnie Larsson, MD;  Location: [place];  Service: Neurosurgery;  Laterality: N/A;   ANTERIOR LUMBAR FUSION N/A 03/25/2020   Procedure: ANTERIOR LUMBAR INTERBODY FUSION LUMBAR FIVE-SACRAL ONE.;  Surgeon: Earnie Larsson, MD;  Location: [place];  Service: Neurosurgery;  Laterality: N/A;  anterior   BACK SURGERY  1996   CARDIAC CATHETERIZATION Left 04/27/2016   Procedure: Left Heart Cath and Coronary Angiography;  Surgeon: Corey Skains, MD;  Location: [place] CV LAB;  Service: Cardiovascular;  Laterality: Left;   CARDIAC CATHETERIZATION N/A 04/27/2016   Procedure: Intravascular Pressure Wire/FFR Study;  Surgeon: Yolonda Kida, MD;  Location: [place] [facility] LAB;  Service: Cardiovascular;  Laterality: N/A;   CATHETERIZATION OF PULMONARY ARTERY WITH RETRIEVAL OF FOREIGN BODY Bilateral 04/07/2011   heart   DEGENERATIVE SPONDYLOLISTHESIS     KNEE ARTHROSCOPY Bilateral 2004   LUMBAR DISC SURGERY  1999   RADICULOPATHY, CERVICAL REGION     TONSILLECTOMY      There were no vitals filed for this visit.   Subjective Assessment - 02/05/22 1641     Subjective Patient has been re-admitted to ER since last session due to additional arterial bleed. Has been absent for two weeks due to being  bed bound.    Patient is accompained by: Family member    Pertinent History Pt has a complex medical history. 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 He states that he fell 10-12 feet and struck his head, neck, and back. He was able to finish the course which took him approximately 45 minutes more to complete. He does endorse a second fall while finishing the course but did not suffer a second head injury. He states that he started getting headaches that day which have persisted since that time. 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 He does report improvement in his  headaches recently. Neurology was concerned about possible BPPV and have referred him for a vestibular evaluation. In addition since the injury, pt developed RUE and RLE pain and weakness. Pt states that NCV showed abnormal nerve conduction in RUE. He has since undergone a C7-7 ACDF on 01/23/20. He reports initial improvement in RUE strength, but states that he has a gradual return of his RUE weakness. He was also having significant RLE weakness and pain and underwent and L5-S1 anterior lumbar interbody fusion on 03/25/20. Patient reports that he does not have any back precautions but needs to wear the low back brace for one more month. Patient reports he has a follow-up appointment with the surgeon next month. Patient reports he has a follow-up appointment with Dr. Brigitte Pulse, neurologist, in August. He arrives to therapy ambulating with an upright rollator walker. Pt denies any mention of concussion after his injury. He has been having cognitive issues since his injury and has previously had a heavy metals screen as well as neurocognitive testing. He has had an MRI of his brain with and without contrast on 12/18/19. Results were mildly motion degraded examination. No evidence of acute intracranial abnormality. Minimal chronic small vessel ischemic disease. He has tremor in both his hands. He has a positive family history of Parkinson's disease in his grandfather. He is complaining of constant dizziness and unsteadiness which are worse with activity. Patient states that he frequently loses his balance and his wife has to steady him.    Limitations Lifting;Standing;Walking;House hold activities    Diagnostic tests He has had an MRI of his brain with and without contrast on 12/18/19. Results were mildly motion degraded examination. No evidence of acute intracranial abnormality. Minimal chronic small vessel ischemic disease.    Patient Stated Goals to be able to ambulate with a cane or no AD, to be able to drive, to be able  to ride his motorcycle    Currently in Pain? Yes    Pain Score 8     Pain Location Head    Pain Orientation Posterior    Pain Descriptors / Indicators Constant;Headache    Pain Type Acute pain    Pain Onset More than a month ago    Pain Frequency Intermittent                  Recert Goals  Walk 10 minutes-uses walking stick, needs seated rest breaks  ABC: 33%  FOTO: 42%  TUG: 19.25 seconds with walking stick; one near LOB  RUE and LE strength 10 MWT perform next session  BERG-perform next session 5xSTS 17.5 Fall frequency strength  Right Left  Shoulder Flexion 4+/5 5/5   Shoulder Abduction 4/5 5/5  ER 4/5 5/5  IR 4-/5 5/5  Elbow  flex 5/5 5/5  Elbow Extend 5/5 5/5    Right Left  Hip flexion 4-/5 5/5  Hip Abduction 4/5 5/5  Hip Adduction 4-/5 5/5  Knee Extension  4-/5 5/5  Knee Flexion 3+/5 5/5  DF 3/5 5/5  PF 3/5 5/5      Treatment: GTB hamstring curl 15 RLE GTB adduction 15x RLE SLR RLE 10x 3 second holds     Ambulate >600 ft with PT from back room with lights off to car upstairs with cane in non injured hand and PT holding gait belt and injured hand's elbow for stabilization, frequent tripping over cane due to being on opposite side of patient's norm. One seated rest break due to severe dizziness  Pt educated throughout session about proper posture and technique with exercises. Improved exercise technique, movement at target joints, use of target muscles after min to mod verbal, visual, tactile cues.  Patient has significant medical events occur since last recert resulting in dominant hand (non-involved) no longer being able to be used for AD. He additionally had two ER visits and was severely ill with COVID. Testing today reveals his new baseline upon which he will build upon. He has increased fatigue from post covid and severe migraine this session resulting in multiple near LOB with ambulation. Patient will benefit from therapy to reduce fall risk,  improve strength, and improve capacity for functional mobility for improved quality of life                   PT Education - 02/05/22 1642     Education Details goals, recert    Person(s) Educated Patient    Methods Explanation;Demonstration;Tactile cues;Verbal cues    Comprehension Verbalized understanding;Returned demonstration;Verbal cues required;Tactile cues required              PT Short Term Goals - 02/05/22 1620       PT SHORT TERM GOAL #1   Title Patient will be independent in home exercise program to improve strength/mobility for better functional independence with ADLs and for self-management.    Baseline 3/3 HEP compliant 3/31 hep compliant    Time 6    Period Weeks    Status Achieved    Target Date 02/20/21      PT SHORT TERM GOAL #2   Title Patient will be modified independent in walking on even/uneven surface with least restrictive assistive device, for 10+ minutes without rest break, reporting some difficulty or less to improve walking tolerance with community ambulation including grocery shopping, going to church,etc.    Baseline 02/03: instability, LOB multiple times while ambulating to elevator 3/31: unable to test due to throwing up 6/20: 6 minutes 7/12: unable to test due to migraine 9/12: defer to next session 11/15: unable to test due to migraine 3/2: requires heavy use of AD and seated rest breaks    Time 6    Period Weeks    Status Partially Met    Target Date 03/19/22      PT SHORT TERM GOAL #3   Title Patient will increase ABC scale score >80% to demonstrate better functional mobility and better confidence with ADLs.    Baseline 01/09/21: 60.625% 3/3/: 50% 3/31: 63% 6/20: 45% 7/25: 40% 9/12: 51.1% 11/15: 48% 1/17: 40% 3/2: 33%    Time 6    Period Weeks    Status On-going    Target Date 03/19/22               PT  Long Term Goals - 02/05/22 1622       PT LONG TERM GOAL #1   Title Patient will increase FOTO score to equal to or  greater than 65 to demonstrate statistically significant improvement in mobility and quality of life.    Baseline scored 41/100 on 05/21/2020; 7/22 40, 8/23: 60/100, 09/30/20=40, 01/09/21: 46 3/3: 40.3% 3/31: 49% 6/20: 45% 7/12: 50.7% 9/12: 44.7% 11/15: 40% 1/17: 40% 3/2: 42%    Time 12    Period Weeks    Status Partially Met    Target Date 04/30/22      PT LONG TERM GOAL #2   Title Patient will reduce falls risk as indicated by decreased TUG time to less than 11 seconds.    Baseline scored 37.9 sec with TUG on 05/21/2020; 21seconds with elevated rollator 7/22, 8/23: 13.62 sec with up and go walker, 01/09/21: 15.81s with hiking stick 3/3: 17.98 seconds one near LOB 3/31: 15.57 seconds with walking stick 6/20: 14.8 seconds with walking stick. 7/25: 19.72 seconds with walking stick. 9/26: 17.6 seconds 11/15: unable to test due to migraine 1/17: deferred due to pain 3/2:19.25 seconds with walking stick in involved hand    Time 12    Period Weeks    Status Partially Met    Target Date 04/30/22      PT LONG TERM GOAL #3   Title Patient will increase right UE and LE gross strength to 4+/5 throughout to improve functional strength for independent gait, increased standing tolerance and increased ADL ability.    Baseline grossly +3/5 to -4/5 right UE and LE strength on 05/21/2020; grossly 4-/5 for RUE, grossly 4-/5 for RLE on 7/22, grossly 4-/5 RUE, grossly 4/5 RLE 3/3: see note 3/2: see note    Time 12    Period Weeks    Status Partially Met    Target Date 04/30/22      PT LONG TERM GOAL #4   Title Patient will increase 10 meter walk test to >1.62ms as to improve gait speed for better community ambulation and to reduce fall risk.    Baseline on 7/22 .57 m/sself selected, fast  .73 m/s with LOB pt reported R knee buckling, elevated rollator used, 8/23: 1.06 m/s with up and go walker, 01/09/21: 0.72 m/s with hiking stick 3/3: 0.74 m/s with walking stick 3/31: 0.81 m/s w walking stick 6/20: 1.1 m/s with  walking stick    Time 12    Period Weeks    Status Achieved      PT LONG TERM GOAL #5   Title Patient will increase Berg Balance score by > 6 points to demonstrate decreased fall risk during functional activities.    Baseline 8/23: 37/56, 09/30/20=40/56, 12/20: 42/56 3/3: deferred 3/31 will perform next time; deferred due to throwing up 6/20: 46/56 7/12 38/56    Time 8    Period Weeks    Status Achieved      PT LONG TERM GOAL #6   Title Patient (< 671years old) will complete five times sit to stand test in < 10 seconds indicating an increased LE strength and improved balance.    Baseline 8/23: 22 sec, 09/30/20=13.04 sec, 12/20: 27.05 sec, 01/09/21: 18.01s 3/3: 23 seconds no UE support 3/31; deferred due to patient throwing up 7/12: 15.2 seconds 11/15: 26 seconds 1/17: deferred due to pain 3/2: 17.5 seconds    Time 12    Period Weeks    Status Partially Met    Target Date  04/30/22      PT LONG TERM GOAL #7   Title Patient will improve 6 min walk test >1000 feet with LRAD for improved gait ability in community.    Baseline 8/23: not tested; 8/30 845, goal adjusted >2078f, 09/30/20=600 feet - stopped test due to R ankle catching making gait unsafe, 11/25/20= 330 feet - stopped test due to severe dizziness, vomiting/nausea, sweating making gait unsafe; 01/09/21: 720 ft with no rest breaks 3/3: deferred due to nausea 3/31: deferred due to patient throwing up 6/20: 640 ft 7/12: 666fwith walking stick, no rest breaks; 9/26: 1000 ft    Time 12    Period Weeks    Status Achieved      PT LONG TERM GOAL #8   Title Patient will increase Berg Balance score by > 6 points  (52/56) to demonstrate decreased fall risk during functional activities.    Baseline 6/20: 46/56 7/12: 38/56 with migraine 9/12: defer to next session 11/15:  unable to test due to migraine 1/17: deferred due to pain 3/2: next session    Time 12    Period Weeks    Status Partially Met    Target Date 04/30/22      PT LONG  TERM GOAL  #9   TITLE Patient will report decreased frequency of falling per week to 1x/week for decreased fall risk and improved quality of life    Baseline 12/1: 4 falls minimum a week 1/17: 1 fall last week 3/2: has been bed bound the week prior    Time 12    Period Weeks    Status On-going    Target Date 04/30/22                   Plan - 02/05/22 1640     Clinical Impression Statement Patient has significant medical events occur since last recert resulting in dominant hand (non-involved) no longer being able to be used for AD. He additionally had two ER visits and was severely ill with COVID. Testing today reveals his new baseline upon which he will build upon. He has increased fatigue from post covid and severe migraine this session resulting in multiple near LOB with ambulation. Patient will benefit from therapy to reduce fall risk, improve strength, and improve capacity for functional mobility for improved quality of life    Personal Factors and Comorbidities Comorbidity 3+;Time since onset of injury/illness/exacerbation    Comorbidities anxiety, back pain, COPD, headache, HTN, MI, sleep apnea, migraines    Examination-Activity Limitations Locomotion Level;Transfers;Bed Mobility;Stand;Stairs;Sleep;Squat;Bend    Examination-Participation Restrictions Driving;Medication Management    Stability/Clinical Decision Making Unstable/Unpredictable    Rehab Potential Fair    PT Frequency 2x / week    PT Duration 12 weeks    PT Treatment/Interventions ADLs/Self Care Home Management;Canalith Repostioning;Moist Heat;Electrical Stimulation;Gait training;Stair training;Functional mobility training;Therapeutic activities;Therapeutic exercise;Balance training;Neuromuscular re-education;Patient/family education;Manual techniques;Passive range of motion;Vestibular    PT Next Visit Plan Continue with functional mobility training and strengthening;    PT Home Exercise Plan Medbridge Access Code:  [email or web address]    Consulted and Agree with Plan of Care Patient;Family member/caregiver    Family Member Consulted Wife             Patient will benefit from skilled therapeutic intervention in order to improve the following deficits and impairments:  Decreased activity tolerance, Decreased balance, Decreased cognition, Decreased mobility, Difficulty walking, Abnormal gait, Decreased range of motion, Dizziness, Pain, Impaired flexibility, Decreased strength, Impaired sensation, Decreased endurance, Decreased safety awareness,  Postural dysfunction, Improper body mechanics, Impaired UE functional use, Impaired vision/preception  Visit Diagnosis: Difficulty in walking, not elsewhere classified  Unsteadiness on feet  Muscle weakness (generalized)     Problem List Patient Active Problem List   Diagnosis Date Noted   [name] (coronary artery disease) 10/21/2021   Degenerative spondylolisthesis 03/25/2020   Physical deconditioning 03/04/2020   Lobar pneumonia, unspecified organism ([place]) 03/04/2020   Intractable hemiplegic migraine with status migrainosus 03/02/2020   Current tobacco use 02/05/2020   Abnormal findings on diagnostic imaging of lung 02/05/2020   Shortness of breath 02/05/2020   Herniated disc, cervical 01/23/2020   Cervical spondylosis with myelopathy and radiculopathy 01/23/2020   Pre-operative respiratory examination 01/12/2020   Weakness on right side of face 12/22/2019   Osteoarthritis of spine with radiculopathy, lumbar region 12/11/2019   Intractable migraine with aura with status migrainosus 11/09/2019   Former smoker 08/27/2018   Benign essential HTN 08/27/2018   Cough productive of purulent sputum 11/08/2017   GERD (gastroesophageal reflux disease) 05/06/2017   Contusion of hand 04/22/2017   BP (high blood pressure) 04/22/2017   Oxygen desaturation 04/22/2017   Prostate lump 04/22/2017   Unstable angina ([place]) 04/27/2016   Chest pain 04/24/2016   Morbid obesity  ([place]) 01/29/2016   Elevated ALT measurement 10/30/2015   Nonspecific elevation of levels of transaminase and lactic acid dehydrogenase (ldh) 10/30/2015   Reduced libido 10/29/2015   Hyperlipidemia 08/06/2015   Anxiety 08/06/2015   Atherosclerosis of coronary artery 08/06/2015   Childhood asthma 08/06/2015   Chronic obstructive pulmonary disease ([place]) 08/06/2015   OSA (obstructive sleep apnea) 08/06/2015   Anxiety disorder 08/06/2015   Atherosclerosis of native coronary artery of native heart with stable angina pectoris ([place]) 12/87/8676   Uncomplicated asthma 72/08/4708   Janna Arch, PT, DPT  02/05/2022, 4:43 PM  [place] MAIN [facility] SERVICES [address] [place], [place], [address] Phone: [phone]   Fax:  [phone]  Name: Cory Espinoza MRN: [id number] Date of Birth: 01-Dec-1971

## 2022-02-07 ENCOUNTER — Emergency Department
Admission: EM | Admit: 2022-02-07 | Discharge: 2022-02-08 | Disposition: A | Discharge status: Other Acute Inpatient Hospital | Payer: BC Managed Care – PPO | Attending: Emergency Medicine | Admitting: Emergency Medicine

## 2022-02-07 ENCOUNTER — Other Ambulatory Visit: Payer: Self-pay

## 2022-02-07 ENCOUNTER — Emergency Department: Payer: BC Managed Care – PPO

## 2022-02-07 DIAGNOSIS — J449 Chronic obstructive pulmonary disease, unspecified: Secondary | ICD-10-CM | POA: Diagnosis not present

## 2022-02-07 DIAGNOSIS — I110 Hypertensive heart disease with heart failure: Secondary | ICD-10-CM | POA: Insufficient documentation

## 2022-02-07 DIAGNOSIS — S61402S Unspecified open wound of left hand, sequela: Secondary | ICD-10-CM | POA: Diagnosis not present

## 2022-02-07 DIAGNOSIS — X58XXXD Exposure to other specified factors, subsequent encounter: Secondary | ICD-10-CM | POA: Insufficient documentation

## 2022-02-07 DIAGNOSIS — Z7902 Long term (current) use of antithrombotics/antiplatelets: Secondary | ICD-10-CM | POA: Diagnosis not present

## 2022-02-07 DIAGNOSIS — I509 Heart failure, unspecified: Secondary | ICD-10-CM | POA: Diagnosis not present

## 2022-02-07 DIAGNOSIS — Z7982 Long term (current) use of aspirin: Secondary | ICD-10-CM | POA: Insufficient documentation

## 2022-02-07 DIAGNOSIS — I729 Aneurysm of unspecified site: Secondary | ICD-10-CM

## 2022-02-07 DIAGNOSIS — X58XXXS Exposure to other specified factors, sequela: Secondary | ICD-10-CM | POA: Diagnosis not present

## 2022-02-07 DIAGNOSIS — S61402D Unspecified open wound of left hand, subsequent encounter: Secondary | ICD-10-CM | POA: Diagnosis not present

## 2022-02-07 DIAGNOSIS — I2510 Atherosclerotic heart disease of native coronary artery without angina pectoris: Secondary | ICD-10-CM | POA: Insufficient documentation

## 2022-02-07 DIAGNOSIS — R58 Hemorrhage, not elsewhere classified: Secondary | ICD-10-CM | POA: Diagnosis not present

## 2022-02-07 DIAGNOSIS — Z87828 Personal history of other (healed) physical injury and trauma: Secondary | ICD-10-CM

## 2022-02-07 LAB — CBC WITH DIFFERENTIAL/PLATELET
Abs Immature Granulocytes: 0.01 10*3/uL (ref 0.00–0.07)
Basophils Absolute: 0 10*3/uL (ref 0.0–0.1)
Basophils Relative: 1 %
Eosinophils Absolute: 0.2 10*3/uL (ref 0.0–0.5)
Eosinophils Relative: 3 %
HCT: 44.2 % (ref 39.0–52.0)
Hemoglobin: 14.7 g/dL (ref 13.0–17.0)
Immature Granulocytes: 0 %
Lymphocytes Relative: 40 %
Lymphs Abs: 2.3 10*3/uL (ref 0.7–4.0)
MCH: 30.9 pg (ref 26.0–34.0)
MCHC: 33.3 g/dL (ref 30.0–36.0)
MCV: 93.1 fL (ref 80.0–100.0)
Monocytes Absolute: 0.5 10*3/uL (ref 0.1–1.0)
Monocytes Relative: 8 %
Neutro Abs: 2.7 10*3/uL (ref 1.7–7.7)
Neutrophils Relative %: 48 %
Platelets: 222 10*3/uL (ref 150–400)
RBC: 4.75 MIL/uL (ref 4.22–5.81)
RDW: 13.2 % (ref 11.5–15.5)
WBC: 5.6 10*3/uL (ref 4.0–10.5)
nRBC: 0 % (ref 0.0–0.2)

## 2022-02-07 LAB — BASIC METABOLIC PANEL
Anion gap: 5 (ref 5–15)
BUN: 9 mg/dL (ref 6–20)
CO2: 26 mmol/L (ref 22–32)
Calcium: 9 mg/dL (ref 8.9–10.3)
Chloride: 107 mmol/L (ref 98–111)
Creatinine, Ser: 0.77 mg/dL (ref 0.61–1.24)
GFR, Estimated: >60 mL/min (ref >60)
Glucose, Bld: 100 mg/dL — ABNORMAL HIGH (ref 70–99)
Potassium: 4.1 mmol/L (ref 3.5–5.1)
Sodium: 138 mmol/L (ref 135–145)

## 2022-02-07 MED ORDER — IOHEXOL 350 MG/ML SOLN
100.0000 mL | Freq: Once | INTRAVENOUS | Status: AC | PRN
  Administered 2022-02-07: 100 mL via INTRAVENOUS

## 2022-02-07 MED ORDER — LIDOCAINE-EPINEPHRINE 1 %-1:100000 IJ SOLN
10.0000 mL | Freq: Once | INTRAMUSCULAR | Status: DC
  Filled 2022-02-07: qty 10

## 2022-02-07 MED ORDER — FENTANYL CITRATE PF 50 MCG/ML IJ SOSY
50.0000 ug | PREFILLED_SYRINGE | Freq: Once | INTRAMUSCULAR | Status: AC
  Administered 2022-02-07: 50 ug via INTRAVENOUS
  Filled 2022-02-07: qty 1

## 2022-02-07 MED ORDER — LIDOCAINE-EPINEPHRINE 2 %-1:100000 IJ SOLN
20.0000 mL | Freq: Once | INTRAMUSCULAR | Status: AC
  Administered 2022-02-07: 20 mL via INTRADERMAL

## 2022-02-07 MED ORDER — ACETAMINOPHEN 500 MG PO TABS
1000.0000 mg | ORAL_TABLET | Freq: Once | ORAL | Status: AC
  Administered 2022-02-07: 1000 mg via ORAL
  Filled 2022-02-07: qty 2

## 2022-02-07 NOTE — ED Provider Notes (Signed)
----------------------------------------- ?  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  The lesion has opened up in the emergency department twice requiring pressure and a dressing.  I evaluated during 1 of these episodes and it appears to have a small arterial pulsatile flow.  I was signed out this patient by Dr. Katrinka Blazing, with a plan to speak to [facility] as Dr. Evie Lacks and Dr. Okey Dupre here at [facility] did not believe they could operate on the hand and recommended hand specialist to further manage.  I spoke to Dr. Janee Morn of [facility] who believes transferring a patient for an arterial ligation is not necessary and that our local orthopedic surgeon should be able to handle this case.  I spoke once again to Dr. Okey Dupre who is reluctant given he is not a trained Hydrographic surveyor.  He spoke with Dr. Janee Morn at [facility] and called me back and stated the patient would need to be transferred to a tertiary care center as [facility] is worried that the patient may require a more significant repair including a possible microvascular repair that they are not able to perform at their location and recommend that the patient be transferred to [facility] or [facility].  I then spoke to Dr. Doran Durand at [facility], who has recommended removing the dressing to see if the wound is hemostatic and remains hemostatic.  If it did he believes the patient could safely go home and follow-up outpatient with Dr. Hyacinth Meeker with whom the patient is already established.  I removed the patient's pressure dressing and immediately the wound started oozing blood.  The patient did not feel comfortable going home even if we redressed it as it has opened up 3 times within 6 hours or so not including the current oozing.  Appears to be hemostatic with minimal pressure on the wound.  Digits remain neuro vascularly intact with a light pressure dressing  in place.  We will transfer to [facility] for definitive care.  Patient agreeable to plan. ?  ?Minna Antis, MD ?02/07/22 1944 ? ?

## 2022-02-07 NOTE — ED Notes (Signed)
[place] checked, will be complete after vital signs obtained ?

## 2022-02-07 NOTE — ED Notes (Signed)
Called [facility] transfer center for transfer spoke to [employer] ?

## 2022-02-07 NOTE — ED Notes (Signed)
EDP in to see, at [facility].  

## 2022-02-07 NOTE — ED Notes (Signed)
Called [facility] for transport to [facility] ED ?

## 2022-02-07 NOTE — ED Notes (Signed)
EDP back at [facility].  

## 2022-02-07 NOTE — ED Notes (Signed)
This RN called report to Bethel Born, charge RN at [facility] ER. ?

## 2022-02-07 NOTE — ED Triage Notes (Signed)
Pt states he had a laceration on his L hand on Jan 20th- pt has had problem with an arterial bleed that keeps popping open- pt placed a tourniquet on his arm at 1320 ?

## 2022-02-07 NOTE — ED Notes (Signed)
L hand palm wound spontaneously started bleeding. Pulsating spurts or blood. Pressure dressing applied. EDP at [facility]. Family at [facility].  ?

## 2022-02-07 NOTE — ED Notes (Addendum)
EDP at [facility]. Tourniquet removed from L FA. Placed at 1320 PTA by pt. L palmar wound present, no active bleeding.  ?

## 2022-02-07 NOTE — ED Notes (Signed)
Called [facility] spoke to [facility] for hand surgery 1700 ?

## 2022-02-07 NOTE — ED Notes (Signed)
Pt comes with lac to left hand that was previously stitched. Artery was severed with original lac. Tourniquet applied by patient. Pt states it busted open today and pt is on thinners.  ?

## 2022-02-07 NOTE — ED Provider Notes (Signed)
? ?[facility] ?Provider Note ? ? ? Event Date/Time  ? First MD Initiated Contact with Patient 02/07/22 1358   ?  (approximate) ? ? ?History  ? ?Laceration ? ? ?HPI ? ?Cory Espinoza is a 51 y.o. 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  Patient states he has been bleeding on and off over the last couple weeks.  States he followed up with orthopedist hand specialist who elected to observe and not do any interventions at the follow-up appointment on 2/15 but feels that his wound is not getting any better.  States that today started spontaneously spurting blood again today.  He does not recall any recent injuries or trauma.  He is able to move his fingers.  He states he placed a tourniquet prior to arrival in his forearm 1320.  States he is on aspirin Plavix.  Denies any other recent bleeding or acute symptoms today.  No other acute concerns at this time. ? ?  ? ? ?Physical Exam  ?Triage Vital Signs: ?ED Triage Vitals  ?Enc Vitals Group  ?   BP   ?   Pulse   ?   Resp   ?   Temp   ?   Temp src   ?   SpO2   ?   Weight   ?   Height   ?   Head Circumference   ?   Peak Flow   ?   Pain Score   ?   Pain Loc   ?   Pain Edu?   ?   Excl. in GC?   ? ? ?Most recent vital signs: ?Vitals:  ? 02/07/22 1420 02/07/22 1425  ?BP:    ?Pulse: 91 92  ?Resp:    ?SpO2: 96% 95%  ? ? ?General: Awake, no distress.  ?CV:  2+ bilateral radial pulses.  There is some paleness in the palm itself but the digits appear well perfused. ?Resp:  Normal effort.  ?Abd:  No distention.  ?Other:  There is an open wound in the center of the left palm.  Patient is able to flex and extend all digits.  Sensation  is intact in the distribution of the radial ulnar median nerves.  There is either pseudoaneurysm versus appears to be a large blood clot in place over the wound.  No surrounding erythema, streaking or warmth or purulence. ? ? ? ? ? ?ED Results / Procedures / Treatments  ?Labs ?(all labs ordered are listed, but only abnormal results are displayed) ?Labs Reviewed  ?BASIC METABOLIC PANEL - Abnormal; Notable for the following components:  ?    Result Value  ? Glucose, Bld 100 (*)   ? All other components within normal limits  ?CBC WITH DIFFERENTIAL/PLATELET  ? ? ? ?EKG ? ? ? ?RADIOLOGY ? ? ? ? ?PROCEDURES: ? ?Critical Care performed: No ? ?Procedures ? ? ? ?MEDICATIONS ORDERED IN ED: ?Medications  ?acetaminophen (TYLENOL) tablet 1,000 mg (1,000 mg Oral Given 02/07/22 1430)  ?lidocaine-EPINEPHrine (XYLOCAINE W/EPI) 2 %-1:100000 (with pres) injection 20 mL (20 mLs Intradermal Given by Other 02/07/22 1424)  ? ? ? ?IMPRESSION / MDM /  ASSESSMENT AND PLAN / ED COURSE  ?I reviewed the triage vital signs and the nursing notes. ?             ?               ? ?Differential diagnosis includes, but is not limited to recurrent bleeding from nonhealing arterial wound, pseudoaneurysm with lower suspicion based on history and exam for interim trauma or acute infectious process.  Patient does note he is on Plavix. ? ?On arrival tourniquet removed at approximately 2:15 PM.  Patient has good distal radial pulse and subsequently noted to have good perfusion of the digits.  He is still pale around the wound site itself. ? ?BMP shows no significant electrolyte or metabolic derangements.  CBC without leukocytosis or acute anemia. ? ?CTA ordered to better assess vasculature in the hand and see if patient could have developed a pseudoaneurysm.  Discussed patient's initial presentation with on-call vascular surgeon Dr. Evie Lacks as well as on-call orthopedist Dr. Okey Dupre with the [facility] colleague Dr. Hyacinth Meeker both physicians felt that patient would  likely benefit from pain specialist which was not currently available at [facility]. ? ?Care patient signed over to assuming father approximately 1500.  Plan is to follow-up CTA and likely reach out to the hand surgeon at [facility] pending results of this. ? ?  ? ? ?FINAL CLINICAL IMPRESSION(S) / ED DIAGNOSES  ? ?Final diagnoses:  ?History of open hand wound  ? ? ? ?Rx / DC Orders  ? ?ED Discharge Orders   ? ? None  ? ?  ? ? ? ?Note:  This document was prepared using Dragon voice recognition software and may include unintentional dictation errors. ?  ?Gilles Chiquito, MD ?02/07/22 1517 ? ?

## 2022-02-09 ENCOUNTER — Ambulatory Visit: Payer: BC Managed Care – PPO

## 2022-02-12 ENCOUNTER — Ambulatory Visit: Payer: BC Managed Care – PPO

## 2022-02-12 ENCOUNTER — Other Ambulatory Visit: Payer: Self-pay | Admitting: Family Medicine

## 2022-02-12 DIAGNOSIS — R1031 Right lower quadrant pain: Secondary | ICD-10-CM

## 2022-02-13 ENCOUNTER — Ambulatory Visit: Admission: RE | Admit: 2022-02-13 | Payer: BC Managed Care – PPO | Source: Ambulatory Visit

## 2022-02-13 ENCOUNTER — Other Ambulatory Visit: Payer: Self-pay | Admitting: Family Medicine

## 2022-02-13 ENCOUNTER — Ambulatory Visit
Admission: RE | Admit: 2022-02-13 | Discharge: 2022-02-13 | Disposition: A | Discharge status: Home / Self Care | Payer: BC Managed Care – PPO | Source: Ambulatory Visit | Attending: Family Medicine | Admitting: Family Medicine

## 2022-02-13 DIAGNOSIS — R1031 Right lower quadrant pain: Secondary | ICD-10-CM

## 2022-02-16 ENCOUNTER — Ambulatory Visit: Payer: BC Managed Care – PPO

## 2022-02-16 ENCOUNTER — Other Ambulatory Visit: Payer: Self-pay

## 2022-02-16 DIAGNOSIS — R2681 Unsteadiness on feet: Secondary | ICD-10-CM

## 2022-02-16 DIAGNOSIS — R262 Difficulty in walking, not elsewhere classified: Secondary | ICD-10-CM | POA: Diagnosis not present

## 2022-02-16 DIAGNOSIS — M6281 Muscle weakness (generalized): Secondary | ICD-10-CM

## 2022-02-16 NOTE — Therapy (Signed)
[place] MAIN [facility] SERVICES [address] [place], [place], [address] Phone: [phone]   Fax:  [phone]  Physical Therapy Treatment  Patient Details  Name: Cory Espinoza MRN: [id number] Date of Birth: [birth year] No data recorded  Encounter Date: 02/16/2022   PT End of Session - 02/16/22 1459     Visit Number 75    Number of Visits 74    Date for PT Re-Evaluation 04/30/22    Authorization Type BCBS PPO;    Authorization Time Period 5/10 PN 12/22/21    PT Start Time 1514    PT Stop Time 1600    PT Time Calculation (min) 46 min    Equipment Utilized During Treatment Gait belt    Activity Tolerance Patient tolerated treatment well;Treatment limited secondary to medical complications (Comment)   provoked symptoms with certain activities   Behavior During Therapy WFL for tasks assessed/performed             Past Medical History:  Diagnosis Date   Allergy    Anginal pain (HCC)    Anxiety    Arthritis    lumbar spine   Asthma    Atrial fibrillation (HCC)    Back pain    Severe Lumbar Pain   CHF (congestive heart failure) (HCC)    COPD (chronic obstructive pulmonary disease) (HCC)    Restrictive lung disease   Coronary artery disease    Dyspnea    Dysrhythmia    afib   GERD (gastroesophageal reflux disease)    Headache    [name], onset October 2020, cluster headaches in the past   History of kidney stones    Hyperlipidemia    Hypertension    Myocardial infarction ([place])    at age 44   Pneumonia    Restrictive lung disease    Sleep apnea    unable to use cpap since onset of migraines    Past Surgical History:  Procedure Laterality Date   ABDOMINAL EXPOSURE N/A 03/25/2020   Procedure: ABDOMINAL EXPOSURE;  Surgeon: Rosetta Posner, MD;  Location: [facility] OR;  Service: Vascular;  Laterality: N/A;   ANTERIOR CERVICAL DECOMP/DISCECTOMY FUSION N/A 01/23/2020   Procedure: ANTERIOR CERVICAL DECOMPRESSION FUSION -  CERVICAL SIX-CERVICAL SEVEN;  Surgeon: Earnie Larsson, MD;  Location: [place];  Service: Neurosurgery;  Laterality: N/A;   ANTERIOR LUMBAR FUSION N/A 03/25/2020   Procedure: ANTERIOR LUMBAR INTERBODY FUSION LUMBAR FIVE-SACRAL ONE.;  Surgeon: Earnie Larsson, MD;  Location: [place];  Service: Neurosurgery;  Laterality: N/A;  anterior   BACK SURGERY  1996   CARDIAC CATHETERIZATION Left 04/27/2016   Procedure: Left Heart Cath and Coronary Angiography;  Surgeon: Corey Skains, MD;  Location: [place] [facility];  Service: Cardiovascular;  Laterality: Left;   CARDIAC CATHETERIZATION N/A 04/27/2016   Procedure: Intravascular Pressure Wire/FFR Study;  Surgeon: Yolonda Kida, MD;  Location: [place] [facility];  Service: Cardiovascular;  Laterality: N/A;   CATHETERIZATION OF PULMONARY ARTERY WITH RETRIEVAL OF FOREIGN BODY Bilateral 04/07/2011   heart   DEGENERATIVE SPONDYLOLISTHESIS     KNEE ARTHROSCOPY Bilateral 2004   LUMBAR DISC SURGERY  1999   RADICULOPATHY, CERVICAL REGION     TONSILLECTOMY      There were no vitals filed for this visit.   Subjective Assessment - 02/16/22 1457     Subjective Patient has gone to the ED twice since last seen by therapist for his hand. Patient will have additional hand surgery .  Patient is accompained by: Family member    Pertinent History Pt has a complex medical history. 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 He states that he fell 10-12 feet and struck his head, neck, and back. He was able to finish the course which took him approximately 45 minutes more to complete. He does endorse a second fall while finishing the course but did not suffer a second head injury. He states that he started getting headaches that day which have persisted since that time. 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 He does report improvement in his headaches recently.  Neurology was concerned about possible BPPV and have referred him for a vestibular evaluation. In addition since the injury, pt developed RUE and RLE pain and weakness. Pt states that NCV showed abnormal nerve conduction in RUE. He has since undergone a C7-7 ACDF on 01/23/20. He reports initial improvement in RUE strength, but states that he has a gradual return of his RUE weakness. He was also having significant RLE weakness and pain and underwent and L5-S1 anterior lumbar interbody fusion on 03/25/20. Patient reports that he does not have any back precautions but needs to wear the low back brace for one more month. Patient reports he has a follow-up appointment with the surgeon next month. Patient reports he has a follow-up appointment with Dr. Brigitte Pulse, neurologist, in August. He arrives to therapy ambulating with an upright rollator walker. Pt denies any mention of concussion after his injury. He has been having cognitive issues since his injury and has previously had a heavy metals screen as well as neurocognitive testing. He has had an MRI of his brain with and without contrast on 12/18/19. Results were mildly motion degraded examination. No evidence of acute intracranial abnormality. Minimal chronic small vessel ischemic disease. He has tremor in both his hands. He has a positive family history of Parkinson's disease in his grandfather. He is complaining of constant dizziness and unsteadiness which are worse with activity. Patient states that he frequently loses his balance and his wife has to steady him.    Limitations Lifting;Standing;Walking;House hold activities    Diagnostic tests He has had an MRI of his brain with and without contrast on 12/18/19. Results were mildly motion degraded examination. No evidence of acute intracranial abnormality. Minimal chronic small vessel ischemic disease.    Patient Stated Goals to be able to ambulate with a cane or no AD, to be able to drive, to be able to ride his  motorcycle    Currently in Pain? Yes    Pain Score 6     Pain Location Head    Pain Orientation Medial    Pain Descriptors / Indicators Aching    Pain Type Acute pain    Pain Onset More than a month ago    Pain Frequency Constant    Pain Onset Yesterday              Patient has been to the ED twice, had a pseudo aneurysm. Is getting agniogram tomorrow at [facility], will need an aneurysm repair, nerve damage into finger.   10 MWT: 9 seconds  in room with lights off and noise damped due to migraine.    TREATMENT   Seated:  4lb ankle weight: -LAQ 15x each LE -march 15x each LE -alternating IR/ER 15x each LE   GTB: -adduction 15x each  LE -hamstring curl 15x each LE  -RUE row 15x with PT guidance for sequencing and scap squeeze -RUE IR 12x each LE -RUE ER 12x each LE   Hamstring isometric pressing into dynadisc 15x 3 second holds each LE   Standing  Static stand with posterior lean 60 seconds Modified tandem stance 30 seconds each LE placement   Pt educated throughout session about proper posture and technique with exercises. Improved exercise technique, movement at target joints, use of target muscles after min to mod verbal, visual, tactile cues.   Patient performed 10 MWT with >1.1 m/s speed.  He does have increased migraine throughout session progressively worsening. Strengthening interventions tolerated well however standing interventions are limited requiring seated rest breaks between each. Patient remains highly motivated throughout session. Awaiting further information about upcoming hand surgery on dominant hand. Patient will benefit from therapy to reduce fall risk, improve strength, and improve capacity for functional mobility for improved quality of life                  PT Education - 02/16/22 1458     Education Details exercise technique, body mechanics    Person(s) Educated Patient    Methods Explanation;Demonstration;Tactile cues;Verbal  cues    Comprehension Verbalized understanding;Returned demonstration;Verbal cues required;Tactile cues required              PT Short Term Goals - 02/05/22 1620       PT SHORT TERM GOAL #1   Title Patient will be independent in home exercise program to improve strength/mobility for better functional independence with ADLs and for self-management.    Baseline 3/3 HEP compliant 3/31 hep compliant    Time 6    Period Weeks    Status Achieved    Target Date 02/20/21      PT SHORT TERM GOAL #2   Title Patient will be modified independent in walking on even/uneven surface with least restrictive assistive device, for 10+ minutes without rest break, reporting some difficulty or less to improve walking tolerance with community ambulation including grocery shopping, going to church,etc.    Baseline 02/03: instability, LOB multiple times while ambulating to elevator 3/31: unable to test due to throwing up 6/20: 6 minutes 7/12: unable to test due to migraine 9/12: defer to next session 11/15: unable to test due to migraine 3/2: requires heavy use of AD and seated rest breaks    Time 6    Period Weeks    Status Partially Met    Target Date 03/19/22      PT SHORT TERM GOAL #3   Title Patient will increase ABC scale score >80% to demonstrate better functional mobility and better confidence with ADLs.    Baseline 01/09/21: 60.625% 3/3/: 50% 3/31: 63% 6/20: 45% 7/25: 40% 9/12: 51.1% 11/15: 48% 1/17: 40% 3/2: 33%    Time 6    Period Weeks    Status On-going    Target Date 03/19/22               PT Long Term Goals - 02/16/22 1743       PT LONG TERM GOAL #1   Title Patient will increase FOTO score to equal to or greater than 65 to demonstrate statistically significant improvement in mobility and quality of life.    Baseline scored 41/100 on 05/21/2020; 7/22 40, 8/23: 60/100, 09/30/20=40, 01/09/21: 46 3/3: 40.3% 3/31: 49% 6/20: 45% 7/12: 50.7% 9/12: 44.7% 11/15: 40% 1/17: 40% 3/2: 42%  Time 12    Period Weeks    Status Partially Met    Target Date 04/30/22      PT LONG TERM GOAL #2   Title Patient will reduce falls risk as indicated by decreased TUG time to less than 11 seconds.    Baseline scored 37.9 sec with TUG on 05/21/2020; 21seconds with elevated rollator 7/22, 8/23: 13.62 sec with up and go walker, 01/09/21: 15.81s with hiking stick 3/3: 17.98 seconds one near LOB 3/31: 15.57 seconds with walking stick 6/20: 14.8 seconds with walking stick. 7/25: 19.72 seconds with walking stick. 9/26: 17.6 seconds 11/15: unable to test due to migraine 1/17: deferred due to pain 3/2:19.25 seconds with walking stick in involved hand    Time 12    Period Weeks    Status Partially Met    Target Date 04/30/22      PT LONG TERM GOAL #3   Title Patient will increase right UE and LE gross strength to 4+/5 throughout to improve functional strength for independent gait, increased standing tolerance and increased ADL ability.    Baseline grossly +3/5 to -4/5 right UE and LE strength on 05/21/2020; grossly 4-/5 for RUE, grossly 4-/5 for RLE on 7/22, grossly 4-/5 RUE, grossly 4/5 RLE 3/3: see note 3/2: see note    Time 12    Period Weeks    Status Partially Met    Target Date 04/30/22      PT LONG TERM GOAL #4   Title Patient will increase 10 meter walk test to >1.46ms as to improve gait speed for better community ambulation and to reduce fall risk.    Baseline on 7/22 .57 m/sself selected, fast  .73 m/s with LOB pt reported R knee buckling, elevated rollator used, 8/23: 1.06 m/s with up and go walker, 01/09/21: 0.72 m/s with hiking stick 3/3: 0.74 m/s with walking stick 3/31: 0.81 m/s w walking stick 6/20: 1.1 m/s with walking stick    Time 12    Period Weeks    Status Achieved      PT LONG TERM GOAL #5   Title Patient will increase Berg Balance score by > 6 points to demonstrate decreased fall risk during functional activities.    Baseline 8/23: 37/56, 09/30/20=40/56, 12/20: 42/56 3/3:  deferred 3/31 will perform next time; deferred due to throwing up 6/20: 46/56 7/12 38/56    Time 8    Period Weeks    Status Achieved      PT LONG TERM GOAL #6   Title Patient (< 662years old) will complete five times sit to stand test in < 10 seconds indicating an increased LE strength and improved balance.    Baseline 8/23: 22 sec, 09/30/20=13.04 sec, 12/20: 27.05 sec, 01/09/21: 18.01s 3/3: 23 seconds no UE support 3/31; deferred due to patient throwing up 7/12: 15.2 seconds 11/15: 26 seconds 1/17: deferred due to pain 3/2: 17.5 seconds    Time 12    Period Weeks    Status Partially Met    Target Date 04/30/22      PT LONG TERM GOAL #7   Title Patient will improve 6 min walk test >1000 feet with LRAD for improved gait ability in community.    Baseline 8/23: not tested; 8/30 845, goal adjusted >2003f 09/30/20=600 feet - stopped test due to R ankle catching making gait unsafe, 11/25/20= 330 feet - stopped test due to severe dizziness, vomiting/nausea, sweating making gait unsafe; 01/09/21: 720 ft with no rest breaks  3/3: deferred due to nausea 3/31: deferred due to patient throwing up 6/20: 640 ft 7/12: 613f with walking stick, no rest breaks; 9/26: 1000 ft    Time 12    Period Weeks    Status Achieved      PT LONG TERM GOAL #8   Title Patient will increase Berg Balance score by > 6 points  (52/56) to demonstrate decreased fall risk during functional activities.    Baseline 6/20: 46/56 7/12: 38/56 with migraine 9/12: defer to next session 11/15:  unable to test due to migraine 1/17: deferred due to pain 3/2: next session    Time 12    Period Weeks    Status Partially Met    Target Date 04/30/22      PT LONG TERM GOAL  #9   TITLE Patient will report decreased frequency of falling per week to 1x/week for decreased fall risk and improved quality of life    Baseline 12/1: 4 falls minimum a week 1/17: 1 fall last week 3/2: has been bed bound the week prior    Time 12    Period Weeks     Status On-going    Target Date 04/30/22                   Plan - 02/16/22 1745     Clinical Impression Statement Patient performed 10 MWT with >1.1 m/s speed; despite his two ED admissions since last session.  He does have increased migraine throughout session progressively worsening. Strengthening interventions tolerated well however standing interventions are limited requiring seated rest breaks between each. Patient remains highly motivated throughout session. Awaiting further information about upcoming hand surgery on dominant hand. Patient will benefit from therapy to reduce fall risk, improve strength, and improve capacity for functional mobility for improved quality of life    Personal Factors and Comorbidities Comorbidity 3+;Time since onset of injury/illness/exacerbation    Comorbidities anxiety, back pain, COPD, headache, HTN, MI, sleep apnea, migraines    Examination-Activity Limitations Locomotion Level;Transfers;Bed Mobility;Stand;Stairs;Sleep;Squat;Bend    Examination-Participation Restrictions Driving;Medication Management    Stability/Clinical Decision Making Unstable/Unpredictable    Rehab Potential Fair    PT Frequency 2x / week    PT Duration 12 weeks    PT Treatment/Interventions ADLs/Self Care Home Management;Canalith Repostioning;Moist Heat;Electrical Stimulation;Gait training;Stair training;Functional mobility training;Therapeutic activities;Therapeutic exercise;Balance training;Neuromuscular re-education;Patient/family education;Manual techniques;Passive range of motion;Vestibular    PT Next Visit Plan Continue with functional mobility training and strengthening;    PT Home Exercise Plan Medbridge Access Code: [email or web address]    Consulted and Agree with Plan of Care Patient;Family member/caregiver    Family Member Consulted Wife             Patient will benefit from skilled therapeutic intervention in order to improve the following deficits and impairments:   Decreased activity tolerance, Decreased balance, Decreased cognition, Decreased mobility, Difficulty walking, Abnormal gait, Decreased range of motion, Dizziness, Pain, Impaired flexibility, Decreased strength, Impaired sensation, Decreased endurance, Decreased safety awareness, Postural dysfunction, Improper body mechanics, Impaired UE functional use, Impaired vision/preception  Visit Diagnosis: Difficulty in walking, not elsewhere classified  Unsteadiness on feet  Muscle weakness (generalized)     Problem List Patient Active Problem List   Diagnosis Date Noted   [name] (coronary artery disease) 10/21/2021   Degenerative spondylolisthesis 03/25/2020   Physical deconditioning 03/04/2020   Lobar pneumonia, unspecified organism [place] 03/04/2020   Intractable hemiplegic migraine with status migrainosus 03/02/2020   Current tobacco use 02/05/2020   Abnormal findings  on diagnostic imaging of lung 02/05/2020   Shortness of breath 02/05/2020   Herniated disc, cervical 01/23/2020   Cervical spondylosis with myelopathy and radiculopathy 01/23/2020   Pre-operative respiratory examination 01/12/2020   Weakness on right side of face 12/22/2019   Osteoarthritis of spine with radiculopathy, lumbar region 12/11/2019   Intractable migraine with aura with status migrainosus 11/09/2019   Former smoker 08/27/2018   Benign essential HTN 08/27/2018   Cough productive of purulent sputum 11/08/2017   GERD (gastroesophageal reflux disease) 05/06/2017   Contusion of hand 04/22/2017   BP (high blood pressure) 04/22/2017   Oxygen desaturation 04/22/2017   Prostate lump 04/22/2017   Unstable angina ([place]) 04/27/2016   Chest pain 04/24/2016   Morbid obesity ([place]) 01/29/2016   Elevated ALT measurement 10/30/2015   Nonspecific elevation of levels of transaminase and lactic acid dehydrogenase (ldh) 10/30/2015   Reduced libido 10/29/2015   Hyperlipidemia 08/06/2015   Anxiety 08/06/2015   Atherosclerosis of  coronary artery 08/06/2015   Childhood asthma 08/06/2015   Chronic obstructive pulmonary disease ([place]) 08/06/2015   OSA (obstructive sleep apnea) 08/06/2015   Anxiety disorder 08/06/2015   Atherosclerosis of native coronary artery of native heart with stable angina pectoris ([place]) 76/81/1572   Uncomplicated asthma 62/02/5596    Janna Arch, PT, DPT  02/16/2022, 5:46 PM  [place] [facility] SERVICES [place], [place], [address] Phone: [phone]   Fax:  [phone]  Name: Cory Espinoza MRN: [id number] Date of Birth: [birth year]

## 2022-02-19 ENCOUNTER — Ambulatory Visit: Payer: BC Managed Care – PPO

## 2022-02-20 ENCOUNTER — Encounter: Payer: Self-pay | Admitting: Cardiovascular Disease

## 2022-02-20 ENCOUNTER — Telehealth: Payer: Self-pay | Admitting: Cardiovascular Disease

## 2022-02-20 NOTE — Telephone Encounter (Signed)
? ?  Pre-operative Risk Assessment  ?  ?Patient Name: Cory Espinoza  ?DOB: 04-Nov-1971 ?MRN: [id number]  ? ?  ? ?Request for Surgical Clearance   ? ?Procedure:  Left hand Aneurysm excision with Microvascular digital artery repair ? ?Date of Surgery:  Clearance 03/04/22                              ?   ?Surgeon:  Lorretta Harp ?Surgeon's Group or Practice Name:  [facility] ?Phone number:  [phone] ?Fax number:  [phone] ?  ?Type of Clearance Requested:   ?Requesting recommendations about Plavix rx ?  ?Type of Anesthesia:  Interscalene Block ?  ?Additional requests/questions:   ? ?Signed, ?Pilar A Ham   ?02/20/2022, 4:04 PM   ?

## 2022-02-20 NOTE — Telephone Encounter (Signed)
Cory Grief "[name]" 51 year old male is requesting preoperative cardiac evaluation for left hand aneurysm excision.  He was last seen in the clinic on 10/21/2021.  He was noted to have significant changes since his previous visit.  He was reporting migraine headaches, falls, and back surgery x2.  He was working with PT for his lower extremity weakness and walking with a walker.  He also reported taking nitroglycerin.  It was unclear whether his symptoms were related to GERD.  His chest discomfort was nonexertional. ? ?His PMH includes coronary artery disease, hypertension, HLD, morbid obesity, and tobacco use. ? ?Would you recommend follow-up visit prior to being cleared for surgery?  May his Plavix be held prior to his procedure? ? ? ?Thank you for your help.  Please direct your response to CV DIV preop pool. ? ?Thomasene Ripple. Epimenio Schetter NP-C ? ?  ?02/20/2022, 4:23 PM ? [facility] ?[address] ?Office [phone] Fax [phone] ? ? ?

## 2022-02-23 ENCOUNTER — Other Ambulatory Visit: Payer: Self-pay

## 2022-02-23 ENCOUNTER — Ambulatory Visit: Payer: BC Managed Care – PPO

## 2022-02-23 DIAGNOSIS — R262 Difficulty in walking, not elsewhere classified: Secondary | ICD-10-CM

## 2022-02-23 DIAGNOSIS — M6281 Muscle weakness (generalized): Secondary | ICD-10-CM

## 2022-02-23 DIAGNOSIS — R2681 Unsteadiness on feet: Secondary | ICD-10-CM

## 2022-02-23 NOTE — Therapy (Signed)
[place] ?[facility] ?[address] ?[place], [place], [address] ?Phone: [phone]   Fax:  [phone] ? ?Physical Therapy Treatment ? ?Patient Details  ?Name: KOLETON DUCHEMIN ?MRN: [id number] ?Date of Birth: 08/22/1971 ?No data recorded ? ?Encounter Date: 02/23/2022 ? ? PT End of Session - 02/23/22 1606   ? ? Visit Number 76   ? Number of Visits 98   ? Date for PT Re-Evaluation 04/30/22   ? Authorization Type BCBS PPO;   ? Authorization Time Period 6/10 PN 12/22/21   ? PT Start Time 1515   ? PT Stop Time 1559   ? PT Time Calculation (min) 44 min   ? Equipment Utilized During Treatment Gait belt   ? Activity Tolerance Patient tolerated treatment well;Treatment limited secondary to medical complications (Comment)   provoked symptoms with certain activities  ? Behavior During Therapy [facility] for tasks assessed/performed   ? ?  ?  ? ?  ? ? ?Past Medical History:  ?Diagnosis Date  ? Allergy   ? Anginal pain (HCC)   ? Anxiety   ? 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 Asthma   ? Atrial fibrillation (HCC)   ? Back pain   ? Severe Lumbar Pain  ? CHF (congestive heart failure) (HCC)   ? COPD (chronic obstructive pulmonary disease) (HCC)   ? Restrictive lung disease  ? Coronary artery disease   ? Dyspnea   ? Dysrhythmia   ? afib  ? GERD (gastroesophageal reflux disease)   ? Headache   ? [name], onset October 2020, cluster headaches in the past  ? History of kidney stones   ? Hyperlipidemia   ? Hypertension   ? Myocardial infarction [facility])   ? at age 56  ? Pneumonia   ? Restrictive lung disease   ? Sleep apnea   ? unable to use cpap since onset of migraines  ? ? ?Past Surgical History:  ?Procedure Laterality Date  ? ABDOMINAL EXPOSURE N/A 03/25/2020  ? Procedure: ABDOMINAL EXPOSURE;  Surgeon: Larina Earthly, MD;  Location[facility] OR;  Service: Vascular;  Laterality: N/A;  ? ANTERIOR CERVICAL DECOMP/DISCECTOMY FUSION N/A 01/23/2020  ? Procedure: ANTERIOR CERVICAL DECOMPRESSION FUSION -  CERVICAL SIX-CERVICAL SEVEN;  Surgeon: Julio Sicks, MD;  Location: [facility] OR;  Service: Neurosurgery;  Laterality: N/A;  ? ANTERIOR LUMBAR FUSION N/A 03/25/2020  ? Procedure: ANTERIOR LUMBAR INTERBODY FUSION LUMBAR FIVE-SACRAL ONE.;  Surgeon: Julio Sicks, MD;  Location: [facility] OR;  Service: Neurosurgery;  Laterality: N/A;  anterior  ? BACK SURGERY  1996  ? CARDIAC CATHETERIZATION Left 04/27/2016  ? Procedure: Left Heart Cath and Coronary Angiography;  Surgeon: Lamar Blinks, MD;  Location: [facility] INVASIVE CV LAB;  Service: Cardiovascular;  Laterality: Left;  ? CARDIAC CATHETERIZATION N/A 04/27/2016  ? Procedure: Intravascular Pressure Wire/FFR Study;  Surgeon: Alwyn Pea, MD;  Location: [facility] INVASIVE CV LAB;  Service: Cardiovascular;  Laterality: N/A;  ? CATHETERIZATION OF PULMONARY ARTERY WITH RETRIEVAL OF FOREIGN BODY Bilateral 04/07/2011  ? heart  ? DEGENERATIVE SPONDYLOLISTHESIS    ? KNEE ARTHROSCOPY Bilateral 2004  ? LUMBAR DISC SURGERY  1999  ? RADICULOPATHY, CERVICAL REGION    ? TONSILLECTOMY    ? ? ?There were no vitals filed for this visit. ? ? Subjective Assessment - 02/23/22 1604   ? ? Subjective Patient has his pre-op appointment Friday and surgery next week. Patient was in bed over the weekend and very sore from last PT  session.   ? Patient is accompained by: Family member   ? Pertinent History Pt has a complex medical history. 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 He states that he fell 10-12 feet and struck his head, neck, and back. He was able to finish the course which took him approximately 45 minutes more to complete. He does endorse a second fall while finishing the course but did not suffer a second head injury. He states that he started getting headaches that day which have persisted since that time. 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 He does report improvement in his  headaches recently. Neurology was concerned about possible BPPV and have referred him for a vestibular evaluation. In addition since the injury, pt developed RUE and RLE pain and weakness. Pt states that NCV showed abnormal nerve conduction in RUE. He has since undergone a C7-7 ACDF on 01/23/20. He reports initial improvement in RUE strength, but states that he has a gradual return of his RUE weakness. He was also having significant RLE weakness and pain and underwent and L5-S1 anterior lumbar interbody fusion on 03/25/20. Patient reports that he does not have any back precautions but needs to wear the low back brace for one more month. Patient reports he has a follow-up appointment with the surgeon next month. Patient reports he has a follow-up appointment with Dr. Clelia Croft, neurologist, in August. He arrives to therapy ambulating with an upright rollator walker. Pt denies any mention of concussion after his injury. He has been having cognitive issues since his injury and has previously had a heavy metals screen as well as neurocognitive testing. He has had an MRI of his brain with and without contrast on 12/18/19. Results were mildly motion degraded examination. No evidence of acute intracranial abnormality. Minimal chronic small vessel ischemic disease. He has tremor in both his hands. He has a positive family history of Parkinson's disease in his grandfather. He is complaining of constant dizziness and unsteadiness which are worse with activity. Patient states that he frequently loses his balance and his wife has to steady him.   ? Limitations Lifting;Standing;Walking;House hold activities   ? Diagnostic tests He has had an MRI of his brain with and without contrast on 12/18/19. Results were mildly motion degraded examination. No evidence of acute intracranial abnormality. Minimal chronic small vessel ischemic disease.   ? Patient Stated Goals to be able to ambulate with a cane or no AD, to be able to drive, to be able  to ride his motorcycle   ? Currently in Pain? Yes   ? Pain Score 6    ? Pain Location Head   ? Pain Orientation Right;Medial   ? Pain Descriptors / Indicators Aching   ? Pain Type Acute pain   ? Pain Onset More than a month ago   ? Pain Frequency Constant   ? Pain Onset Yesterday   ? ?  ?  ? ?  ? ? ? ? ?in room with lights off and noise damped due to migraine.  ?  ?  ?TREATMENT ?  ?Seated:  ?3lb ankle weight: ?-LAQ 15x each LE ?-march 15x each LE ?-alternating IR/ER 15x each LE ?-heel raise 20x ?  ?GTB: ?-adduction 15x each LE ?-hamstring curl 15x each LE  ?-RUE row 15x with PT guidance for sequencing and scap squeeze ?-RUE IR 12x  ?-RUE  ER 12x  ?-RUE triceps extension 12x  ? ?Hamstring isometric pressing into dynadisc 15x 3 second holds each LE ?  ?3lb dumbell:  ?-RUE bicep curl 15x  ?-RUE hammer curl 12x ?  ?Pt educated throughout session about proper posture and technique with exercises. Improved exercise technique, movement at target joints, use of target muscles after min to mod verbal, visual, tactile cues. ?  ? ?Patient presents with medium migraine and excellent motivation. He does fatigue requiring mixing of LE and UE strengthening interventions for maximum performance. Patient is unsteady upon standing at end of session but is able to regain stability once in standing for prolonged period.Patient will benefit from therapy to reduce fall risk, improve strength, and improve capacity for functional mobility for improved quality of life ? ? ? ? ? ? ? ? ? ? ? ? ? ? ? ? ? ? ? ? ? ? PT Education - 02/23/22 1605   ? ? Education Details exercise technique, body mechanics   ? Person(s) Educated Patient   ? Methods Explanation;Demonstration;Tactile cues;Verbal cues   ? Comprehension Verbalized understanding;Verbal cues required;Returned demonstration;Tactile cues required   ? ?  ?  ? ?  ? ? ? PT Short Term Goals - 02/05/22 1620   ? ?  ? PT SHORT TERM GOAL #1  ? Title Patient will be independent in home exercise  program to improve strength/mobility for better functional independence with ADLs and for self-management.   ? Baseline 3/3 HEP compliant 3/31 hep compliant   ? Time 6   ? Period Weeks   ? Status Achieved   ? Target Dat

## 2022-02-25 NOTE — Progress Notes (Signed)
?  ?  ? ? ? ?Date:  02/26/2022  ? ?ID:  Cory Espinoza, DOB May 18, 1971, MRN [id number] ? ?Patient Location:  ?[address] ?[place] [address]  ? ?Provider location:   ?[facility] office ? ?PCP:  Cory Mina, MD  ?Cardiologist:  [facility] ? ?Chief Complaint  ?Patient presents with  ? OTher  ?  Pre op clearance -- surgery is 03/04/2022 -- hand.  ?Patient c/o some chest pain- Had to take nitro a few times. Meds reviewed verbally with patient.   ? ? ? ?History of Present Illness:   ? ?KHAIR Espinoza is a 51 y.o. male with past medical history of ?Smoker, COPD, quit 6 years ago ?Anxiety ?Hyperlipidemia ?[name], cardiac catheterization  04/27/2016 ?HTN ?OSA, not on CPAP ?11/2017: PNA ?Prior withdrawal from xanax 02/2018 ?Allergy to asa (swelling in throat/face) ?Who presents for follow-up of his coronary artery disease, stable angina ? ?Last seen by myself in clinic November 2022 ? 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  ? ?He denies any significant chest pain on exertion ?Not taking nitro ? ?Prior history of severe migraines,  ?History of falls, hit head on occasions ?Back surgery x2 ?Memory issue, long term , ?Chronic tremor ?Continues with physical therapy to restore strength on right side arm and leg, ? ?EKG personally reviewed by myself on todays visit ?Nsr rate 83 no significant ST or T wave changes ? ?CT chest 03/04/2018 ?CT chest 01/03/2018 ? ?cardiac catheterization  04/27/2016 ?Lesions initially felt to be 70 or 75%, downgraded after FFR ?negative FFR of AV groove lesion in OM1 ostial lesion ?Ost 2nd Mrg lesion, 60% stenosed. ?Mid Cx lesion, 50% stenosed. ? ? ?Prior review of lab work shows ?Total cholesterol 148 LDL 88 ?Borderline sugars, glucose 100 ? ?Past Medical History:   ?Diagnosis Date  ? Allergy   ? Anginal pain (HCC)   ? Anxiety   ? 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 Asthma   ? Atrial fibrillation (HCC)   ? Back pain   ? Severe Lumbar Pain  ? CHF (congestive heart failure) (HCC)   ? COPD (chronic obstructive pulmonary disease) (HCC)   ? Restrictive lung disease  ? Coronary artery disease   ? Dyspnea   ? Dysrhythmia   ? afib  ? GERD (gastroesophageal reflux disease)   ? Headache   ? [name], onset October 2020, cluster headaches in the past  ? History of kidney stones   ? Hyperlipidemia   ? Hypertension   ? Myocardial infarction [facility])   ? at age 64  ? Pneumonia   ? Restrictive lung disease   ? Sleep apnea   ? unable to use cpap since onset of migraines  ? ?Past Surgical History:  ?Procedure Laterality Date  ? ABDOMINAL EXPOSURE N/A 03/25/2020  ? Procedure: ABDOMINAL EXPOSURE;  Surgeon: Cory Earthly, MD;  Location: [facility] OR;  Service: Vascular;  Laterality: N/A;  ? ANTERIOR CERVICAL DECOMP/DISCECTOMY FUSION N/A 01/23/2020  ? Procedure: ANTERIOR CERVICAL DECOMPRESSION FUSION - CERVICAL SIX-CERVICAL SEVEN;  Surgeon: Cory Sicks, MD;  Location: [facility] OR;  Service: Neurosurgery;  Laterality: N/A;  ? ANTERIOR LUMBAR FUSION N/A 03/25/2020  ? Procedure: ANTERIOR LUMBAR INTERBODY FUSION LUMBAR FIVE-SACRAL ONE.;  Surgeon: Cory Sicks, MD;  Location: [facility] OR;  Service: Neurosurgery;  Laterality: N/A;  anterior  ? BACK SURGERY  1996  ? CARDIAC CATHETERIZATION Left 04/27/2016  ? Procedure: Left Heart Cath and Coronary Angiography;  Surgeon: Cory Blinks, MD;  Location: [facility] INVASIVE CV LAB;  Service: Cardiovascular;  Laterality: Left;  ? CARDIAC CATHETERIZATION N/A 04/27/2016  ? Procedure: Intravascular Pressure Wire/FFR Study;  Surgeon: Cory Pea, MD;  Location: [facility] INVASIVE CV LAB;  Service: Cardiovascular;  Laterality: N/A;  ? CATHETERIZATION OF PULMONARY ARTERY WITH RETRIEVAL OF FOREIGN BODY Bilateral 04/07/2011  ? heart  ? DEGENERATIVE SPONDYLOLISTHESIS    ? KNEE ARTHROSCOPY Bilateral  2004  ? LUMBAR DISC SURGERY  1999  ? RADICULOPATHY, CERVICAL REGION    ? TONSILLECTOMY    ?  ? ? ?Allergies:   Aspirin, Amoxicillin, Codeine, Penicillins, Cefdinir, and Hydrocodone-acetaminophen  ? ?Social History  ? ?Tobacco Use  ? Smoking status: Former  ?  Packs/day: 2.00  ?  Years: 23.00  ?  Pack years: 46.00  ?  Types: Cigarettes  ?  Start date: 10/28/1989  ?  Quit date: 10/28/2012  ?  Years since quitting: 9.3  ? Smokeless tobacco: Current  ?  Types: Snuff  ?Vaping Use  ? Vaping Use: Never used  ?Substance Use Topics  ? Alcohol use: Yes  ?  Alcohol/week: 0.0 standard drinks  ?  Comment: rarely  ? Drug use: No  ?  ? ?Current Outpatient Medications on File Prior to Visit  ?Medication Sig Dispense Refill  ? albuterol (PROVENTIL HFA;VENTOLIN HFA) 108 (90 Base) MCG/ACT inhaler Inhale 1-2 puffs into the lungs every 4 (four) hours as needed for wheezing or shortness of breath. 1 Inhaler 5  ? ALPRAZolam (XANAX) 1 MG tablet Take 1 mg by mouth 3 (three) times daily.    ? atorvastatin (LIPITOR) 40 MG tablet Take 1 tablet (40 mg total) by mouth at bedtime. 90 tablet 1  ? B COMPLEX VITAMINS PO Take 1 tablet by mouth daily.     ? baclofen (LIORESAL) 10 MG tablet Take 10 mg by mouth 2 (two) times daily as needed for muscle spasms.    ? benzonatate (TESSALON) 200 MG capsule Take 1 capsule (200 mg total) by mouth 3 (three) times daily as needed for cough. 30 capsule 2  ? calcium carbonate (OS-CAL - DOSED IN MG OF ELEMENTAL CALCIUM) 1250 (500 Ca) MG tablet Take by mouth.    ? cetirizine (ZYRTEC) 10 MG tablet TAKE 1 TABLET(10 MG) BY MOUTH DAILY 90 tablet 1  ? Cholecalciferol (VITAMIN D) 125 MCG (5000 UT) CAPS Take 5,000 Units by mouth daily.    ? clopidogrel (PLAVIX) 75 MG tablet Take 1 tablet (75 mg total) by mouth daily. 90 tablet 1  ? DULoxetine (CYMBALTA) 60 MG capsule Take 60 mg by mouth daily.    ? EMGALITY 120 MG/ML SOAJ SMARTSIG:1 Milliliter(s) SUB-Q Every 4 Weeks    ? escitalopram (LEXAPRO) 20 MG tablet Take 20 mg by  mouth at bedtime.     ? ezetimibe (ZETIA) 10 MG tablet Take 1 tablet (10 mg total) by mouth daily. 90 tablet 3  ? fluticasone (FLONASE) 50 MCG/ACT nasal spray Place into the nose.    ? ipratropium-albuterol (DUONEB) 0.5-2.5 (3) MG/3ML SOLN Take 3 mLs by nebulization every 4 (four) hours as needed. DX: J44.9 COPD 360 mL 3  ? magnesium oxide (MAG-OX) 400 MG tablet Take 400 mg by mouth daily.     ? Melatonin 10 MG TABS Take 30 mg by mouth at bedtime.    ?  naratriptan (AMERGE) 2.5 MG tablet TAKE 1 TABLET(2.5 MG) BY MOUTH 1 TIME FOR UP TO 1 DOSE AS NEEDED FOR MIGRAINE. MAY TAKE A SECOND DOSE AFTER 4 HOURS AS NEEDED    ? nitroGLYCERIN (NITROSTAT) 0.4 MG SL tablet Place 1 tablet (0.4 mg total) under the tongue every 5 (five) minutes as needed for chest pain. 25 tablet 1  ? NURTEC 75 MG TBDP Place 75 mg under the tongue daily as needed (migraine).     ? OXcarbazepine ER (OXTELLAR XR) 600 MG TB24 TAKE 2 TABLETS(1200 MG) BY MOUTH EVERY DAY    ? pantoprazole (PROTONIX) 40 MG tablet TAKE 1 TABLET(40 MG) BY MOUTH DAILY 30 tablet 10  ? Potassium 99 MG TABS Take 99 mg by mouth daily.     ? pregabalin (LYRICA) 200 MG capsule Take 200 mg by mouth 3 (three) times daily.    ? Probiotic Product (PROBIOTIC DAILY PO) Take 1 tablet by mouth daily.    ? promethazine (PHENERGAN) 25 MG tablet Take 25 mg by mouth daily.    ? pyridOXINE (VITAMIN B-6) 100 MG tablet Take 100 mg by mouth daily.     ? Saw Palmetto 450 MG CAPS Take 900 mg by mouth daily.     ? Tiotropium Bromide Monohydrate (SPIRIVA RESPIMAT) 2.5 MCG/ACT AERS Inhale 2 puffs into the lungs daily. 8 g 0  ? zinc sulfate 220 (50 Zn) MG capsule Take by mouth.    ? zonisamide (ZONEGRAN) 100 MG capsule Take by mouth.    ? pregabalin (LYRICA) 100 MG capsule Take 100 mg by mouth 3 (three) times daily.    ? ?No current facility-administered medications on file prior to visit.  ? ? ?Family Hx: ?The patient's family history includes Cancer in his paternal grandmother; Diabetes in his paternal  grandmother and sister; Heart attack (age of onset: 8036) in his paternal grandfather; Heart attack (age of onset: 6338) in his father; Heart attack (age of onset: 3045) in his brother; Heart disease in his

## 2022-02-26 ENCOUNTER — Ambulatory Visit (INDEPENDENT_AMBULATORY_CARE_PROVIDER_SITE_OTHER): Payer: BC Managed Care – PPO | Admitting: Cardiovascular Disease

## 2022-02-26 ENCOUNTER — Ambulatory Visit: Payer: BC Managed Care – PPO

## 2022-02-26 ENCOUNTER — Other Ambulatory Visit: Payer: Self-pay

## 2022-02-26 ENCOUNTER — Encounter: Payer: Self-pay | Admitting: Cardiovascular Disease

## 2022-02-26 VITALS — BP 124/90 | HR 102 | Ht 72.0 in | Wt 258.0 lb

## 2022-02-26 DIAGNOSIS — G4733 Obstructive sleep apnea (adult) (pediatric): Secondary | ICD-10-CM | POA: Diagnosis not present

## 2022-02-26 DIAGNOSIS — E782 Mixed hyperlipidemia: Secondary | ICD-10-CM

## 2022-02-26 DIAGNOSIS — I10 Essential (primary) hypertension: Secondary | ICD-10-CM

## 2022-02-26 DIAGNOSIS — J432 Centrilobular emphysema: Secondary | ICD-10-CM

## 2022-02-26 DIAGNOSIS — I25118 Atherosclerotic heart disease of native coronary artery with other forms of angina pectoris: Secondary | ICD-10-CM | POA: Diagnosis not present

## 2022-02-26 DIAGNOSIS — E785 Hyperlipidemia, unspecified: Secondary | ICD-10-CM

## 2022-02-26 DIAGNOSIS — Z720 Tobacco use: Secondary | ICD-10-CM

## 2022-02-26 MED ORDER — METOPROLOL SUCCINATE ER 25 MG PO TB24: 25.0000 mg | ORAL_TABLET | Freq: Every day | ORAL | 3 refills | Status: DC

## 2022-02-26 NOTE — Patient Instructions (Addendum)
Medication Instructions:  ?Please start metoprolol succinate 25 mg daily ? ?Hold plavix until surgeon says ok to restart ? ?If you need a refill on your cardiac medications before your next appointment, please call your pharmacy.  ? ?Lab work: ?No new labs needed ? ?Testing/Procedures: ?No new testing needed ? ?Follow-Up: ?At [facility], you and your health needs are our priority.  As part of our continuing mission to provide you with exceptional heart care, we have created designated Provider Care Teams.  These Care Teams include your primary Cardiologist (physician) and Advanced Practice Providers (APPs -  Physician Assistants and Nurse Practitioners) who all work together to provide you with the care you need, when you need it. ? ?You will need a follow up appointment in 12 months ? ?Providers on your designated Care Team:   ?Murray Hodgkins, NP ?Christell Faith, PA-C ?Cadence Kathlen Mody, PA-C ? ?COVID-19 Vaccine Information can be found at: [email or web address] For questions related to vaccine distribution or appointments, please email [email or web address] or call [phone].  ? ?

## 2022-02-27 NOTE — Telephone Encounter (Signed)
Dr. Mariah Milling, please review and provide recommendations on follow-up and Plavix.  Thank you for your help. ? ?Thomasene Ripple. Nikolaos Maddocks NP-C ? ?  ?02/27/2022, 3:05 PM ?[facility] ?[address] ?Office [phone] Fax [phone] ? ?

## 2022-02-27 NOTE — Telephone Encounter (Signed)
[facility] checking on status of clearance ?

## 2022-03-02 ENCOUNTER — Ambulatory Visit: Payer: BC Managed Care – PPO

## 2022-03-02 NOTE — Telephone Encounter (Signed)
? ?  Patient Name: Cory Espinoza  ?DOB: [birth year] ?MRN: [id number] ? ?Primary Cardiologist: Dr. Mariah Milling ? ?Chart reviewed as part of pre-operative protocol coverage. Patient seen in clinic 02/26/22 by Dr. Mariah Milling with preoperative recommendations as outlined. Will bundle this msg and his OV note to requesting surgeon. (Pt was already holding Plavix at that visit per Dr. Windell Hummingbird note.)  ? ?Please call with questions. ? ?Laurann Montana, PA-C ?03/02/2022, 8:31 AM ? ? ?

## 2022-03-02 NOTE — Telephone Encounter (Signed)
Transmission not received .  Faxed again per request.  ?

## 2022-03-05 ENCOUNTER — Ambulatory Visit: Payer: BC Managed Care – PPO

## 2022-03-09 ENCOUNTER — Ambulatory Visit: Payer: BC Managed Care – PPO | Attending: Neurology

## 2022-03-09 DIAGNOSIS — R262 Difficulty in walking, not elsewhere classified: Secondary | ICD-10-CM | POA: Diagnosis present

## 2022-03-09 DIAGNOSIS — M25642 Stiffness of left hand, not elsewhere classified: Secondary | ICD-10-CM | POA: Insufficient documentation

## 2022-03-09 DIAGNOSIS — R2681 Unsteadiness on feet: Secondary | ICD-10-CM | POA: Insufficient documentation

## 2022-03-09 DIAGNOSIS — M79642 Pain in left hand: Secondary | ICD-10-CM | POA: Insufficient documentation

## 2022-03-09 DIAGNOSIS — M6281 Muscle weakness (generalized): Secondary | ICD-10-CM | POA: Diagnosis present

## 2022-03-09 DIAGNOSIS — R42 Dizziness and giddiness: Secondary | ICD-10-CM | POA: Insufficient documentation

## 2022-03-09 DIAGNOSIS — R208 Other disturbances of skin sensation: Secondary | ICD-10-CM | POA: Insufficient documentation

## 2022-03-09 DIAGNOSIS — M25632 Stiffness of left wrist, not elsewhere classified: Secondary | ICD-10-CM | POA: Diagnosis present

## 2022-03-09 DIAGNOSIS — L905 Scar conditions and fibrosis of skin: Secondary | ICD-10-CM | POA: Insufficient documentation

## 2022-03-09 NOTE — Therapy (Signed)
[place] ?[facility] ?[address] ?[place], [place], [address] ?Phone: [phone]   Fax:  [phone] ? ?Physical Therapy Treatment ? ?Patient Details  ?Name: Cory Espinoza ?MRN: [id number] ?Date of Birth: October 21, 1971 ?No data recorded ? ?Encounter Date: 03/09/2022 ? ? PT End of Session - 03/09/22 1647   ? ? Visit Number [id number]   ? Number of Visits 98   ? Date for PT Re-Evaluation 04/30/22   ? Authorization Type BCBS PPO;   ? Authorization Time Period 7/10 PN 12/22/21   ? PT Start Time 1515   ? PT Stop Time 1600   ? PT Time Calculation (min) 45 min   ? Equipment Utilized During Treatment Gait belt   ? Activity Tolerance Patient tolerated treatment well;Treatment limited secondary to medical complications (Comment)   provoked symptoms with certain activities  ? Behavior During Therapy [facility] for tasks assessed/performed   ? ?  ?  ? ?  ? ? ?Past Medical History:  ?Diagnosis Date  ? Allergy   ? Anginal pain (HCC)   ? Anxiety   ? 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 Asthma   ? Atrial fibrillation (HCC)   ? Back pain   ? Severe Lumbar Pain  ? CHF (congestive heart failure) (HCC)   ? COPD (chronic obstructive pulmonary disease) (HCC)   ? Restrictive lung disease  ? Coronary artery disease   ? Dyspnea   ? Dysrhythmia   ? afib  ? GERD (gastroesophageal reflux disease)   ? Headache   ? [name], onset October 2020, cluster headaches in the past  ? History of kidney stones   ? Hyperlipidemia   ? Hypertension   ? Myocardial infarction [facility])   ? at age 67  ? Pneumonia   ? Restrictive lung disease   ? Sleep apnea   ? unable to use cpap since onset of migraines  ? ? ?Past Surgical History:  ?Procedure Laterality Date  ? ABDOMINAL EXPOSURE N/A 03/25/2020  ? Procedure: ABDOMINAL EXPOSURE;  Surgeon: Larina Earthly, MD;  Location: [facility] OR;  Service: Vascular;  Laterality: N/A;  ? ANTERIOR CERVICAL DECOMP/DISCECTOMY FUSION N/A 01/23/2020  ? Procedure: ANTERIOR CERVICAL DECOMPRESSION FUSION -  CERVICAL SIX-CERVICAL SEVEN;  Surgeon: Julio Sicks, MD;  Location: [facility] OR;  Service: Neurosurgery;  Laterality: N/A;  ? ANTERIOR LUMBAR FUSION N/A 03/25/2020  ? Procedure: ANTERIOR LUMBAR INTERBODY FUSION LUMBAR FIVE-SACRAL ONE.;  Surgeon: Julio Sicks, MD;  Location: [facility] OR;  Service: Neurosurgery;  Laterality: N/A;  anterior  ? BACK SURGERY  1996  ? CARDIAC CATHETERIZATION Left 04/27/2016  ? Procedure: Left Heart Cath and Coronary Angiography;  Surgeon: Lamar Blinks, MD;  Location: [facility] INVASIVE CV LAB;  Service: Cardiovascular;  Laterality: Left;  ? CARDIAC CATHETERIZATION N/A 04/27/2016  ? Procedure: Intravascular Pressure Wire/FFR Study;  Surgeon: Alwyn Pea, MD;  Location: [facility] INVASIVE CV LAB;  Service: Cardiovascular;  Laterality: N/A;  ? CATHETERIZATION OF PULMONARY ARTERY WITH RETRIEVAL OF FOREIGN BODY Bilateral 04/07/2011  ? heart  ? DEGENERATIVE SPONDYLOLISTHESIS    ? KNEE ARTHROSCOPY Bilateral 2004  ? LUMBAR DISC SURGERY  1999  ? RADICULOPATHY, CERVICAL REGION    ? TONSILLECTOMY    ? ? ?There were no vitals filed for this visit. ? ? Subjective Assessment - 03/09/22 1518   ? ? Subjective Patient had hand surgery on Friday. Is recovering, had a nerve block but has improved since then. Will see follow up next week.   ?  Patient is accompained by: Family member   ? Pertinent History Pt has a complex medical history. 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 He states that he fell 10-12 feet and struck his head, neck, and back. He was able to finish the course which took him approximately 45 minutes more to complete. He does endorse a second fall while finishing the course but did not suffer a second head injury. He states that he started getting headaches that day which have persisted since that time. 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 He does report improvement in his headaches  recently. Neurology was concerned about possible BPPV and have referred him for a vestibular evaluation. In addition since the injury, pt developed RUE and RLE pain and weakness. Pt states that NCV showed abnormal nerve conduction in RUE. He has since undergone a C7-7 ACDF on 01/23/20. He reports initial improvement in RUE strength, but states that he has a gradual return of his RUE weakness. He was also having significant RLE weakness and pain and underwent and L5-S1 anterior lumbar interbody fusion on 03/25/20. Patient reports that he does not have any back precautions but needs to wear the low back brace for one more month. Patient reports he has a follow-up appointment with the surgeon next month. Patient reports he has a follow-up appointment with Dr. Clelia Croft, neurologist, in August. He arrives to therapy ambulating with an upright rollator walker. Pt denies any mention of concussion after his injury. He has been having cognitive issues since his injury and has previously had a heavy metals screen as well as neurocognitive testing. He has had an MRI of his brain with and without contrast on 12/18/19. Results were mildly motion degraded examination. No evidence of acute intracranial abnormality. Minimal chronic small vessel ischemic disease. He has tremor in both his hands. He has a positive family history of Parkinson's disease in his grandfather. He is complaining of constant dizziness and unsteadiness which are worse with activity. Patient states that he frequently loses his balance and his wife has to steady him.   ? Limitations Lifting;Standing;Walking;House hold activities   ? Diagnostic tests He has had an MRI of his brain with and without contrast on 12/18/19. Results were mildly motion degraded examination. No evidence of acute intracranial abnormality. Minimal chronic small vessel ischemic disease.   ? Patient Stated Goals to be able to ambulate with a cane or no AD, to be able to drive, to be able to ride  his motorcycle   ? Currently in Pain? Yes   ? Pain Score 8    ? Pain Location Hand   ? Pain Orientation Left   ? Pain Descriptors / Indicators Aching   ? Pain Onset More than a month ago   ? Multiple Pain Sites Yes   ? Pain Score 8   ? Pain Location Head   ? Pain Orientation Right;Left   ? Pain Descriptors / Indicators Aching   ? Pain Type Chronic pain   ? Pain Onset Yesterday   ? ?  ?  ? ?  ? ? ? ? ? ? ? ? ? ?in room with lights off and noise damped due to migraine.  ?  ?  ?TREATMENT ?  ?Seated:  ?3lb ankle weight: ?-LAQ 15x each LE ?-march 15x each LE ?  ?3lb dumbell:  ?-RUE bicep curl 15x  ?-  RUE hammer curl 12x ?  ?Long sit with back support ?STM to upper traps and cervical paraspinals with implementation of effleurage and p?trissage for pain reduction x 11 minutes ? ? ?Ambulate >600 ft with PT from back room with lights off to car upstairs with cane in non injured hand and PT holding gait belt and injured hand's elbow for stabilization, frequent tripping over cane due to being on opposite side of patient's norm. One seated rest break due to severe dizziness ? Car transfer with min A for RLE  ? ?Pt educated throughout session about proper posture and technique with exercises. Improved exercise technique, movement at target joints, use of target muscles after min to mod verbal, visual, tactile cues. ?  ? ? ? ? ?Patient returning to PT after his hand surgery. High pain levels in hand and migraine limits session but patient remains eager for progression despite pain. He is highly motivated throughout session.  Patient is challenged with stability when walking with increased migraine and dizziness. Encouraged patient to follow up with hand therapist for recovery. Patient will benefit from therapy to reduce fall risk, improve strength, and improve capacity for functional mobility for improved quality of life ? ? ? ? ? ? ? ? ? ? ? ? ? ? ? PT Education - 03/09/22 1647   ? ? Education Details exercise technique, body  mechanics   ? Person(s) Educated Patient   ? Methods Explanation;Demonstration;Verbal cues;Tactile cues   ? Comprehension Verbalized understanding;Returned demonstration;Verbal cues required;Tactile cues required   ? ?

## 2022-03-12 ENCOUNTER — Ambulatory Visit: Payer: BC Managed Care – PPO

## 2022-03-16 ENCOUNTER — Ambulatory Visit: Payer: BC Managed Care – PPO

## 2022-03-16 DIAGNOSIS — R262 Difficulty in walking, not elsewhere classified: Secondary | ICD-10-CM | POA: Diagnosis not present

## 2022-03-16 DIAGNOSIS — R2681 Unsteadiness on feet: Secondary | ICD-10-CM

## 2022-03-16 DIAGNOSIS — M6281 Muscle weakness (generalized): Secondary | ICD-10-CM

## 2022-03-16 NOTE — Therapy (Signed)
[place] ?[facility] ?[address] ?[place], [place], [address] ?Phone: [phone]   Fax:  [phone] ? ?Physical Therapy Treatment ? ?Patient Details  ?Name: MARCQUES Espinoza ?MRN: [id number] ?Date of Birth: [birth year] ?No data recorded ? ?Encounter Date: 03/16/2022 ? ? PT End of Session - 03/16/22 1603   ? ? Visit Number [id number]   ? Number of Visits 98   ? Date for PT Re-Evaluation 04/30/22   ? Authorization Type BCBS PPO;   ? Authorization Time Period 8/10 PN 12/22/21   ? PT Start Time 1515   ? PT Stop Time 1600   ? PT Time Calculation (min) 45 min   ? Equipment Utilized During Treatment Gait belt   ? Activity Tolerance Patient tolerated treatment well;Treatment limited secondary to medical complications (Comment)   provoked symptoms with certain activities  ? Behavior During Therapy [facility] for tasks assessed/performed   ? ?  ?  ? ?  ? ? ?Past Medical History:  ?Diagnosis Date  ? Allergy   ? Anginal pain (HCC)   ? Anxiety   ? 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 Asthma   ? Atrial fibrillation (HCC)   ? Back pain   ? Severe Lumbar Pain  ? CHF (congestive heart failure) (HCC)   ? COPD (chronic obstructive pulmonary disease) (HCC)   ? Restrictive lung disease  ? Coronary artery disease   ? Dyspnea   ? Dysrhythmia   ? afib  ? GERD (gastroesophageal reflux disease)   ? Headache   ? [name], onset October 2020, cluster headaches in the past  ? History of kidney stones   ? Hyperlipidemia   ? Hypertension   ? Myocardial infarction [facility])   ? at age 62  ? Pneumonia   ? Restrictive lung disease   ? Sleep apnea   ? unable to use cpap since onset of migraines  ? ? ?Past Surgical History:  ?Procedure Laterality Date  ? ABDOMINAL EXPOSURE N/A 03/25/2020  ? Procedure: ABDOMINAL EXPOSURE;  Surgeon: Larina Earthly, MD;  Location: [facility] OR;  Service: Vascular;  Laterality: N/A;  ? ANTERIOR CERVICAL DECOMP/DISCECTOMY FUSION N/A 01/23/2020  ? Procedure: ANTERIOR CERVICAL DECOMPRESSION FUSION -  CERVICAL SIX-CERVICAL SEVEN;  Surgeon: Julio Sicks, MD;  Location: [facility] OR;  Service: Neurosurgery;  Laterality: N/A;  ? ANTERIOR LUMBAR FUSION N/A 03/25/2020  ? Procedure: ANTERIOR LUMBAR INTERBODY FUSION LUMBAR FIVE-SACRAL ONE.;  Surgeon: Julio Sicks, MD;  Location: [facility] OR;  Service: Neurosurgery;  Laterality: N/A;  anterior  ? BACK SURGERY  1996  ? CARDIAC CATHETERIZATION Left 04/27/2016  ? Procedure: Left Heart Cath and Coronary Angiography;  Surgeon: Lamar Blinks, MD;  Location: [facility] INVASIVE CV LAB;  Service: Cardiovascular;  Laterality: Left;  ? CARDIAC CATHETERIZATION N/A 04/27/2016  ? Procedure: Intravascular Pressure Wire/FFR Study;  Surgeon: Alwyn Pea, MD;  Location: [facility] INVASIVE CV LAB;  Service: Cardiovascular;  Laterality: N/A;  ? CATHETERIZATION OF PULMONARY ARTERY WITH RETRIEVAL OF FOREIGN BODY Bilateral 04/07/2011  ? heart  ? DEGENERATIVE SPONDYLOLISTHESIS    ? KNEE ARTHROSCOPY Bilateral 2004  ? LUMBAR DISC SURGERY  1999  ? RADICULOPATHY, CERVICAL REGION    ? TONSILLECTOMY    ? ? ?There were no vitals filed for this visit. ? ? Subjective Assessment - 03/16/22 1520   ? ? Subjective Patient fell and opened his hand back up . Is wearing his sling.   ? Patient is accompained by: Family member   ?  Pertinent History Pt has a complex medical history. 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 He states that he fell 10-12 feet and struck his head, neck, and back. He was able to finish the course which took him approximately 45 minutes more to complete. He does endorse a second fall while finishing the course but did not suffer a second head injury. He states that he started getting headaches that day which have persisted since that time. 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 He does report improvement in his headaches recently. Neurology was concerned about possible BPPV and have  referred him for a vestibular evaluation. In addition since the injury, pt developed RUE and RLE pain and weakness. Pt states that NCV showed abnormal nerve conduction in RUE. He has since undergone a C7-7 ACDF on 01/23/20. He reports initial improvement in RUE strength, but states that he has a gradual return of his RUE weakness. He was also having significant RLE weakness and pain and underwent and L5-S1 anterior lumbar interbody fusion on 03/25/20. Patient reports that he does not have any back precautions but needs to wear the low back brace for one more month. Patient reports he has a follow-up appointment with the surgeon next month. Patient reports he has a follow-up appointment with Dr. Clelia Croft, neurologist, in August. He arrives to therapy ambulating with an upright rollator walker. Pt denies any mention of concussion after his injury. He has been having cognitive issues since his injury and has previously had a heavy metals screen as well as neurocognitive testing. He has had an MRI of his brain with and without contrast on 12/18/19. Results were mildly motion degraded examination. No evidence of acute intracranial abnormality. Minimal chronic small vessel ischemic disease. He has tremor in both his hands. He has a positive family history of Parkinson's disease in his grandfather. He is complaining of constant dizziness and unsteadiness which are worse with activity. Patient states that he frequently loses his balance and his wife has to steady him.   ? Limitations Lifting;Standing;Walking;House hold activities   ? Diagnostic tests He has had an MRI of his brain with and without contrast on 12/18/19. Results were mildly motion degraded examination. No evidence of acute intracranial abnormality. Minimal chronic small vessel ischemic disease.   ? Patient Stated Goals to be able to ambulate with a cane or no AD, to be able to drive, to be able to ride his motorcycle   ? Currently in Pain? Yes   ? Pain Score 3    ?  Pain Location Head   ? Pain Orientation Medial   ? Pain Descriptors / Indicators Aching   ? Pain Type Neuropathic pain   ? Pain Onset More than a month ago   ? Pain Onset Yesterday   ? ?  ?  ? ?  ? ? ? ? ? ?  ?in room with lights off and noise damped due to migraine.  ?  ?  ?TREATMENT ?  ?Leg Press: ?-Bilateral LE with assistance for RLE to place on pad 12x at #75 ?-Single LE: min A for RLE to maintain contact with pad 10x #25; 10x #55 LLE;  ? ?Seated:  ?6" step toe taps with cues to stare at wall to decrease dizziness x10 each LE ? ?  ?GTB: ?-adduction 15x each LE ?-hamstring curl 15x each LE  ?-RUE  row 15x with PT guidance for sequencing and scap squeeze ?-RUE IR 12x  ?-RUE ER 12x  ?-RUE bicep curl 12x  ? ?Sling education and proper donning positioning  ?  ?Pt educated throughout session about proper posture and technique with exercises. Improved exercise technique, movement at target joints, use of target muscles after min to mod verbal, visual, tactile cues. ?  ? ?Patient's migraine is more manageable today allowing for use of leg press. Assistance is required for RLE to maintain contact with leg press. Patient's sling is on incorrectly and requires correction for positioning. Patient will benefit from therapy to reduce fall risk, improve strength, and improve capacity for functional mobility for improved quality of life ? ? ? ? ? ? ? ? ? ? ? ? ? ? ? ? ? ? ? ? ? PT Education - 03/16/22 1603   ? ? Education Details exercise technique, body mechanics   ? Person(s) Educated Patient   ? Methods Explanation;Demonstration;Tactile cues;Verbal cues   ? Comprehension Verbalized understanding;Returned demonstration;Verbal cues required;Tactile cues required   ? ?  ?  ? ?  ? ? ? PT Short Term Goals - 02/05/22 1620   ? ?  ? PT SHORT TERM GOAL #1  ? Title Patient will be independent in home exercise program to improve strength/mobility for better functional independence with ADLs and for self-management.   ? Baseline 3/3 HEP  compliant 3/31 hep compliant   ? Time 6   ? Period Weeks   ? Status Achieved   ? Target Date 02/20/21   ?  ? PT SHORT TERM GOAL #2  ? Title Patient will be modified independent in walking on even/uneven surface wi

## 2022-03-19 ENCOUNTER — Ambulatory Visit: Payer: BC Managed Care – PPO

## 2022-03-23 ENCOUNTER — Ambulatory Visit: Payer: BC Managed Care – PPO

## 2022-03-23 DIAGNOSIS — R262 Difficulty in walking, not elsewhere classified: Secondary | ICD-10-CM

## 2022-03-23 DIAGNOSIS — M6281 Muscle weakness (generalized): Secondary | ICD-10-CM

## 2022-03-23 DIAGNOSIS — R2681 Unsteadiness on feet: Secondary | ICD-10-CM

## 2022-03-23 NOTE — Therapy (Signed)
[place] ?[facility] ?[address] ?[place], [place], [address] ?Phone: [phone]   Fax:  [phone] ? ?Physical Therapy Treatment ? ?Patient Details  ?Name: Cory Espinoza ?MRN: [id number] ?Date of Birth: [birth year] ?No data recorded ? ?Encounter Date: 03/23/2022 ? ? PT End of Session - 03/23/22 1602   ? ? Visit Number [id number]   ? Number of Visits 98   ? Date for PT Re-Evaluation 04/30/22   ? Authorization Type BCBS PPO;   ? Authorization Time Period 9/10 PN 12/22/21   ? PT Start Time 1515   ? PT Stop Time 1559   ? PT Time Calculation (min) 44 min   ? Equipment Utilized During Treatment Gait belt   ? Activity Tolerance Patient tolerated treatment well;Treatment limited secondary to medical complications (Comment)   provoked symptoms with certain activities  ? Behavior During Therapy [facility] for tasks assessed/performed   ? ?  ?  ? ?  ? ? ?Past Medical History:  ?Diagnosis Date  ? Allergy   ? Anginal pain (HCC)   ? Anxiety   ? 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 Asthma   ? Atrial fibrillation (HCC)   ? Back pain   ? Severe Lumbar Pain  ? CHF (congestive heart failure) (HCC)   ? COPD (chronic obstructive pulmonary disease) (HCC)   ? Restrictive lung disease  ? Coronary artery disease   ? Dyspnea   ? Dysrhythmia   ? afib  ? GERD (gastroesophageal reflux disease)   ? Headache   ? [name], onset October 2020, cluster headaches in the past  ? History of kidney stones   ? Hyperlipidemia   ? Hypertension   ? Myocardial infarction [facility])   ? at age 39  ? Pneumonia   ? Restrictive lung disease   ? Sleep apnea   ? unable to use cpap since onset of migraines  ? ? ?Past Surgical History:  ?Procedure Laterality Date  ? ABDOMINAL EXPOSURE N/A 03/25/2020  ? Procedure: ABDOMINAL EXPOSURE;  Surgeon: Larina Earthly, MD;  Location: [facility] OR;  Service: Vascular;  Laterality: N/A;  ? ANTERIOR CERVICAL DECOMP/DISCECTOMY FUSION N/A 01/23/2020  ? Procedure: ANTERIOR CERVICAL DECOMPRESSION FUSION -  CERVICAL SIX-CERVICAL SEVEN;  Surgeon: Julio Sicks, MD;  Location: [facility] OR;  Service: Neurosurgery;  Laterality: N/A;  ? ANTERIOR LUMBAR FUSION N/A 03/25/2020  ? Procedure: ANTERIOR LUMBAR INTERBODY FUSION LUMBAR FIVE-SACRAL ONE.;  Surgeon: Julio Sicks, MD;  Location: [facility] OR;  Service: Neurosurgery;  Laterality: N/A;  anterior  ? BACK SURGERY  1996  ? CARDIAC CATHETERIZATION Left 04/27/2016  ? Procedure: Left Heart Cath and Coronary Angiography;  Surgeon: Lamar Blinks, MD;  Location: [facility] INVASIVE CV LAB;  Service: Cardiovascular;  Laterality: Left;  ? CARDIAC CATHETERIZATION N/A 04/27/2016  ? Procedure: Intravascular Pressure Wire/FFR Study;  Surgeon: Alwyn Pea, MD;  Location: [facility] INVASIVE CV LAB;  Service: Cardiovascular;  Laterality: N/A;  ? CATHETERIZATION OF PULMONARY ARTERY WITH RETRIEVAL OF FOREIGN BODY Bilateral 04/07/2011  ? heart  ? DEGENERATIVE SPONDYLOLISTHESIS    ? KNEE ARTHROSCOPY Bilateral 2004  ? LUMBAR DISC SURGERY  1999  ? RADICULOPATHY, CERVICAL REGION    ? TONSILLECTOMY    ? ? ?There were no vitals filed for this visit. ? ? Subjective Assessment - 03/23/22 1601   ? ? Subjective Patient reports they took some of his stitches out but not all due to the state of his hand. On antibiotics.   ?  Patient is accompained by: Family member   ? Pertinent History Pt has a complex medical history. 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 He states that he fell 10-12 feet and struck his head, neck, and back. He was able to finish the course which took him approximately 45 minutes more to complete. He does endorse a second fall while finishing the course but did not suffer a second head injury. He states that he started getting headaches that day which have persisted since that time. 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 He does report improvement in his headaches recently. Neurology was  concerned about possible BPPV and have referred him for a vestibular evaluation. In addition since the injury, pt developed RUE and RLE pain and weakness. Pt states that NCV showed abnormal nerve conduction in RUE. He has since undergone a C7-7 ACDF on 01/23/20. He reports initial improvement in RUE strength, but states that he has a gradual return of his RUE weakness. He was also having significant RLE weakness and pain and underwent and L5-S1 anterior lumbar interbody fusion on 03/25/20. Patient reports that he does not have any back precautions but needs to wear the low back brace for one more month. Patient reports he has a follow-up appointment with the surgeon next month. Patient reports he has a follow-up appointment with Dr. Clelia Croft, neurologist, in August. He arrives to therapy ambulating with an upright rollator walker. Pt denies any mention of concussion after his injury. He has been having cognitive issues since his injury and has previously had a heavy metals screen as well as neurocognitive testing. He has had an MRI of his brain with and without contrast on 12/18/19. Results were mildly motion degraded examination. No evidence of acute intracranial abnormality. Minimal chronic small vessel ischemic disease. He has tremor in both his hands. He has a positive family history of Parkinson's disease in his grandfather. He is complaining of constant dizziness and unsteadiness which are worse with activity. Patient states that he frequently loses his balance and his wife has to steady him.   ? Limitations Lifting;Standing;Walking;House hold activities   ? Diagnostic tests He has had an MRI of his brain with and without contrast on 12/18/19. Results were mildly motion degraded examination. No evidence of acute intracranial abnormality. Minimal chronic small vessel ischemic disease.   ? Patient Stated Goals to be able to ambulate with a cane or no AD, to be able to drive, to be able to ride his motorcycle   ?  Currently in Pain? Yes   ? Pain Score 6    ? Pain Location Head   ? Pain Orientation Mid   ? Pain Descriptors / Indicators Aching   ? Pain Type Neuropathic pain   ? Pain Onset More than a month ago   ? Pain Frequency Constant   ? Pain Onset Yesterday   ? ?  ?  ? ?  ? ? ? ?Patient fell on his bed when he was getting up.  ? ? ? ? ? ? ? ? ? ?in room with lights off and noise damped due to migraine.  ?  ?  ?TREATMENT ?  ?  ?3lb ankle weight: ?-LAQ 10x each LE ?-march 10x each LE ?-hip abduction/adduction straight leg 10x each LE ?-heel raise 20x ?  ?  ?GTB: ?-adduction 15x each LE ?-hamstring curl 15x  each LE  ?-RUE row 15x with PT guidance for sequencing and scap squeeze ?-RUE IR 12x  ?-RUE ER 12x  ? ?4lb dumbbell: ?Bicep curl 10x RUE ?Hammer curl 10x RUE  ?  ?Pt educated throughout session about proper posture and technique with exercises. Improved exercise technique, movement at target joints, use of target muscles after min to mod verbal, visual, tactile cues. ?  ? ? ?Patient presents with excellent motivation to physical therapy. He does have higher levels of back pain requiring use of heat pad and back with armrests throughout session. Correction of knee brace due to improper fit performed. Patient will benefit from therapy to reduce fall risk, improve strength, and improve capacity for functional mobility for improved quality of life ? ? ? ? ? ? ? ? ? ? ? ? ? PT Education - 03/23/22 1602   ? ? Education Details exercise technique, body mechanics   ? Person(s) Educated Patient   ? Methods Explanation;Demonstration;Verbal cues;Tactile cues   ? Comprehension Verbalized understanding;Returned demonstration;Verbal cues required;Tactile cues required   ? ?  ?  ? ?  ? ? ? PT Short Term Goals - 02/05/22 1620   ? ?  ? PT SHORT TERM GOAL #1  ? Title Patient will be independent in home exercise program to improve strength/mobility for better functional independence with ADLs and for self-management.   ? Baseline 3/3 HEP  compliant 3/31 hep compliant   ? Time 6   ? Period Weeks   ? Status Achieved   ? Target Date 02/20/21   ?  ? PT SHORT TERM GOAL #2  ? Title Patient will be modified independent in walking on even/uneven surface with le

## 2022-03-26 ENCOUNTER — Ambulatory Visit: Payer: BC Managed Care – PPO

## 2022-03-26 DIAGNOSIS — R262 Difficulty in walking, not elsewhere classified: Secondary | ICD-10-CM | POA: Diagnosis not present

## 2022-03-26 DIAGNOSIS — M6281 Muscle weakness (generalized): Secondary | ICD-10-CM

## 2022-03-26 DIAGNOSIS — R2681 Unsteadiness on feet: Secondary | ICD-10-CM

## 2022-03-26 DIAGNOSIS — R42 Dizziness and giddiness: Secondary | ICD-10-CM

## 2022-03-26 NOTE — Therapy (Signed)
[place] ?[facility] ?[address] ?[place], [place], [address] ?Phone: [phone]   Fax:  [phone] ? ?Physical Therapy Treatment/ Physical Therapy Progress Note ? ? ?Dates of reporting period  12/22/21   to   03/26/22  ? ?Patient Details  ?Name: CHAIM GATLEY ?MRN: [id number] ?Date of Birth: [birth year] ?No data recorded ? ?Encounter Date: 03/26/2022 ? ? PT End of Session - 03/26/22 1512   ? ? Visit Number 80   ? Number of Visits 98   ? Date for PT Re-Evaluation 04/30/22   ? Authorization Type BCBS PPO;   ? Authorization Time Period 10/10 PN 12/22/21; next session 1/10 PN 4/20   ? PT Start Time 1515   ? PT Stop Time 1559   ? PT Time Calculation (min) 44 min   ? Equipment Utilized During Treatment Gait belt   ? Activity Tolerance Patient tolerated treatment well;Treatment limited secondary to medical complications (Comment)   provoked symptoms with certain activities  ? Behavior During Therapy [facility] for tasks assessed/performed   ? ?  ?  ? ?  ? ? ?Past Medical History:  ?Diagnosis Date  ? Allergy   ? Anginal pain (HCC)   ? Anxiety   ? 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 Asthma   ? Atrial fibrillation (HCC)   ? Back pain   ? Severe Lumbar Pain  ? CHF (congestive heart failure) (HCC)   ? COPD (chronic obstructive pulmonary disease) (HCC)   ? Restrictive lung disease  ? Coronary artery disease   ? Dyspnea   ? Dysrhythmia   ? afib  ? GERD (gastroesophageal reflux disease)   ? Headache   ? [name], onset October 2020, cluster headaches in the past  ? History of kidney stones   ? Hyperlipidemia   ? Hypertension   ? Myocardial infarction [facility])   ? at age 38  ? Pneumonia   ? Restrictive lung disease   ? Sleep apnea   ? unable to use cpap since onset of migraines  ? ? ?Past Surgical History:  ?Procedure Laterality Date  ? ABDOMINAL EXPOSURE N/A 03/25/2020  ? Procedure: ABDOMINAL EXPOSURE;  Surgeon: Larina Earthly, MD;  Location: [facility] OR;  Service: Vascular;  Laterality: N/A;  ?  ANTERIOR CERVICAL DECOMP/DISCECTOMY FUSION N/A 01/23/2020  ? Procedure: ANTERIOR CERVICAL DECOMPRESSION FUSION - CERVICAL SIX-CERVICAL SEVEN;  Surgeon: Julio Sicks, MD;  Location: [facility] OR;  Service: Neurosurgery;  Laterality: N/A;  ? ANTERIOR LUMBAR FUSION N/A 03/25/2020  ? Procedure: ANTERIOR LUMBAR INTERBODY FUSION LUMBAR FIVE-SACRAL ONE.;  Surgeon: Julio Sicks, MD;  Location: [facility] OR;  Service: Neurosurgery;  Laterality: N/A;  anterior  ? BACK SURGERY  1996  ? CARDIAC CATHETERIZATION Left 04/27/2016  ? Procedure: Left Heart Cath and Coronary Angiography;  Surgeon: Lamar Blinks, MD;  Location: [facility] INVASIVE CV LAB;  Service: Cardiovascular;  Laterality: Left;  ? CARDIAC CATHETERIZATION N/A 04/27/2016  ? Procedure: Intravascular Pressure Wire/FFR Study;  Surgeon: Alwyn Pea, MD;  Location: [facility] INVASIVE CV LAB;  Service: Cardiovascular;  Laterality: N/A;  ? CATHETERIZATION OF PULMONARY ARTERY WITH RETRIEVAL OF FOREIGN BODY Bilateral 04/07/2011  ? heart  ? DEGENERATIVE SPONDYLOLISTHESIS    ? KNEE ARTHROSCOPY Bilateral 2004  ? LUMBAR DISC SURGERY  1999  ? RADICULOPATHY, CERVICAL REGION    ? TONSILLECTOMY    ? ? ?There were no vitals filed for this visit. ? ? Subjective Assessment - 03/26/22 1510   ? ?  Subjective Patient had a seizure monday (19th seizure). His stitches are now out of his hand. He fell today and landed on closet door.   ? Patient is accompained by: Family member   ? Pertinent History Pt has a complex medical history. 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 He states that he fell 10-12 feet and struck his head, neck, and back. He was able to finish the course which took him approximately 45 minutes more to complete. He does endorse a second fall while finishing the course but did not suffer a second head injury. He states that he started getting headaches that day which have persisted since that time. He is currently under the care of neurology who is treating  him for hemiplegic migraines and R occipital neuralgia. He does report improvement in his headaches recently. Neurology was concerned about possible BPPV and have referred him for a vestibular evaluation. In addition since the injury, pt developed RUE and RLE pain and weakness. Pt states that NCV showed abnormal nerve conduction in RUE. He has since undergone a C7-7 ACDF on 01/23/20. He reports initial improvement in RUE strength, but states that he has a gradual return of his RUE weakness. He was also having significant RLE weakness and pain and underwent and L5-S1 anterior lumbar interbody fusion on 03/25/20. Patient reports that he does not have any back precautions but needs to wear the low back brace for one more month. Patient reports he has a follow-up appointment with the surgeon next month. Patient reports he has a follow-up appointment with Dr. Clelia Croft, neurologist, in August. He arrives to therapy ambulating with an upright rollator walker. Pt denies any mention of concussion after his injury. He has been having cognitive issues since his injury and has previously had a heavy metals screen as well as neurocognitive testing. He has had an MRI of his brain with and without contrast on 12/18/19. Results were mildly motion degraded examination. No evidence of acute intracranial abnormality. Minimal chronic small vessel ischemic disease. He has tremor in both his hands. He has a positive family history of Parkinson's disease in his grandfather. He is complaining of constant dizziness and unsteadiness which are worse with activity. Patient states that he frequently loses his balance and his wife has to steady him.   ? Limitations Lifting;Standing;Walking;House hold activities   ? Diagnostic tests He has had an MRI of his brain with and without contrast on 12/18/19. Results were mildly motion degraded examination. No evidence of acute intracranial abnormality. Minimal chronic small vessel ischemic disease.   ? Patient  Stated Goals to be able to ambulate with a cane or no AD, to be able to drive, to be able to ride his motorcycle   ? Currently in Pain? Yes   ? Pain Score 6    ? Pain Location Back   ? Pain Orientation Lower   ? Pain Descriptors / Indicators Aching   ? Pain Type Acute pain   ? Pain Onset More than a month ago   ? Pain Onset Yesterday   ? ?  ?  ? ?  ? ? ? ? ?Progress note ?Independent walking 10 minutes: walked around yard yesterday  ?ABC: 39%  ?TUG: defer to next session  ?RUE and RLE strength: RLE 4-/5 with quad 3+ RUE: 4/5 bicep 3+/5  ?10 MWT: defer to next session  ?BERG: defer to next session ?Decrease falling frequency: 2-3x/week  ? ?  Treatment:  ?GTB: ?-abduction 15x ?-adduction 15x ?-hamstring curl 15x ?-RUE row 15x ? ?Ambulate >600 ft with PT from back room with lights off to car upstairs with cane in non injured hand and PT holding gait belt and injured hand's elbow for stabilization, frequent tripping over cane due to being on opposite side of patient's norm. One seated rest break due to severe dizziness ? Car transfer with min A for RLE  ? ?Patient's condition has the potential to improve in response to therapy. Maximum improvement is yet to be obtained. The anticipated improvement is attainable and reasonable in a generally predictable time.  Patient reports he is frustrated as he knows he is progressing but not at the level upon which he can return to his leisure activities such as riding his bike.  ? ? ?Patient's goals limited in performance this session due to pain of 7/10 migraine and pain in low back limiting his mobility. His strength is improving at this time in his leg and arm. Patient's condition has the potential to improve in response to therapy. Maximum improvement is yet to be obtained. The anticipated improvement is attainable and reasonable in a generally predictable time.  Patient will benefit from therapy to reduce fall risk, improve strength, and improve capacity for functional mobility  for improved quality of life ? ? ? ? ? ? ? ? ? ? ? ? ? ? ? ? ? ? ? ? ? ? ? ? ? PT Education - 03/26/22 1511   ? ? Education Details progress note   ? Person(s) Educated Patient   ? Methods Explanation;

## 2022-03-30 ENCOUNTER — Ambulatory Visit: Payer: BC Managed Care – PPO

## 2022-03-31 ENCOUNTER — Encounter: Payer: Self-pay | Admitting: Occupational Therapy

## 2022-03-31 ENCOUNTER — Ambulatory Visit: Payer: BC Managed Care – PPO | Admitting: Occupational Therapy

## 2022-03-31 DIAGNOSIS — M25642 Stiffness of left hand, not elsewhere classified: Secondary | ICD-10-CM

## 2022-03-31 DIAGNOSIS — R208 Other disturbances of skin sensation: Secondary | ICD-10-CM

## 2022-03-31 DIAGNOSIS — M6281 Muscle weakness (generalized): Secondary | ICD-10-CM

## 2022-03-31 DIAGNOSIS — L905 Scar conditions and fibrosis of skin: Secondary | ICD-10-CM

## 2022-03-31 DIAGNOSIS — M25632 Stiffness of left wrist, not elsewhere classified: Secondary | ICD-10-CM

## 2022-03-31 DIAGNOSIS — M79642 Pain in left hand: Secondary | ICD-10-CM

## 2022-03-31 DIAGNOSIS — R262 Difficulty in walking, not elsewhere classified: Secondary | ICD-10-CM | POA: Diagnosis not present

## 2022-03-31 NOTE — Therapy (Signed)
[place] ?[facility] ?[address] ?[place], [place], [address] ?Phone: [phone]   Fax:  [phone] ? ?Occupational Therapy Evaluation ? ?Patient Details  ?Name: NADAV SWINDELL ?MRN: [id number] ?Date of Birth: 11/19/1971 ?Referring Provider (OT): Dr Cathie Hoops ? ? ?Encounter Date: 03/31/2022 ? ? OT End of Session - 03/31/22 1759   ? ? Visit Number 1   ? Number of Visits 16   ? Date for OT Re-Evaluation 05/26/22   ? OT Start Time 1440   ? OT Stop Time 1528   ? OT Time Calculation (min) 48 min   ? Activity Tolerance Patient tolerated treatment well   ? Behavior During Therapy [facility] for tasks assessed/performed   ? ?  ?  ? ?  ? ? ?Past Medical History:  ?Diagnosis Date  ? Allergy   ? Anginal pain (HCC)   ? Anxiety   ? 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 Asthma   ? Atrial fibrillation (HCC)   ? Back pain   ? Severe Lumbar Pain  ? CHF (congestive heart failure) (HCC)   ? COPD (chronic obstructive pulmonary disease) (HCC)   ? Restrictive lung disease  ? Coronary artery disease   ? Dyspnea   ? Dysrhythmia   ? afib  ? GERD (gastroesophageal reflux disease)   ? Headache   ? [name], onset October 2020, cluster headaches in the past  ? History of kidney stones   ? Hyperlipidemia   ? Hypertension   ? Myocardial infarction [facility])   ? at age 17  ? Pneumonia   ? Restrictive lung disease   ? Sleep apnea   ? unable to use cpap since onset of migraines  ? ? ?Past Surgical History:  ?Procedure Laterality Date  ? ABDOMINAL EXPOSURE N/A 03/25/2020  ? Procedure: ABDOMINAL EXPOSURE;  Surgeon: Larina Earthly, MD;  Location: [facility] OR;  Service: Vascular;  Laterality: N/A;  ? ANTERIOR CERVICAL DECOMP/DISCECTOMY FUSION N/A 01/23/2020  ? Procedure: ANTERIOR CERVICAL DECOMPRESSION FUSION - CERVICAL SIX-CERVICAL SEVEN;  Surgeon: Julio Sicks, MD;  Location: [facility] OR;  Service: Neurosurgery;  Laterality: N/A;  ? ANTERIOR LUMBAR FUSION N/A 03/25/2020  ? Procedure: ANTERIOR LUMBAR INTERBODY FUSION LUMBAR  FIVE-SACRAL ONE.;  Surgeon: Julio Sicks, MD;  Location: [facility] OR;  Service: Neurosurgery;  Laterality: N/A;  anterior  ? BACK SURGERY  1996  ? CARDIAC CATHETERIZATION Left 04/27/2016  ? Procedure: Left Heart Cath and Coronary Angiography;  Surgeon: Lamar Blinks, MD;  Location: [facility] INVASIVE CV LAB;  Service: Cardiovascular;  Laterality: Left;  ? CARDIAC CATHETERIZATION N/A 04/27/2016  ? Procedure: Intravascular Pressure Wire/FFR Study;  Surgeon: Alwyn Pea, MD;  Location: [facility] INVASIVE CV LAB;  Service: Cardiovascular;  Laterality: N/A;  ? CATHETERIZATION OF PULMONARY ARTERY WITH RETRIEVAL OF FOREIGN BODY Bilateral 04/07/2011  ? heart  ? DEGENERATIVE SPONDYLOLISTHESIS    ? KNEE ARTHROSCOPY Bilateral 2004  ? LUMBAR DISC SURGERY  1999  ? RADICULOPATHY, CERVICAL REGION    ? TONSILLECTOMY    ? ? ?There were no vitals filed for this visit. ? ? Subjective Assessment - 03/31/22 1749   ? ? Subjective  I am so glad to see you.  I tried to do some of the exercises the surgeon told me.  But I need help to get my motion and strength back in my left hand, I am dependent on this to use it with my walking stick and working my shop doing woodwork.  My left hand is my strong hand.  Although I am right-handed.   ? 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    Previous History:  02/09/2022: Per  [name], MD on 02/07/2022: "Mr. BEREN YNIGUEZ is a 51 y.o. 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 Presented to OSH for evaluation of continued bleeding from the site. Bleeding has been intermittent since injury. Was recently seen in outside orthopaedic clinic 2/15 and decision was to manage nonoperatively. States that today started spontaneously "spurting blood." No recent injuries or  trauma. He is on aspirin and Plavix.  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 The patient has since developed a pulsating mass in the left palm which intermittently bleeds with a pulsating pattern. He was recently seen in the [facility] Emergency Room on 02/07/2022 where they had difficulty getting the bleeding under control. He was transferred to the [facility] where the bleeding was under control and he was sent home. He is here for routine follow-up. He has previously been seen as an outpatient by Dr. Deeann Saint for this issue who ultimately had elected to proceed with nonoperative management. That visit was on 01/29/2022.     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 At his last visit, he was sent for an angiogram. He is here to discuss the results.    Today, the patient reports his aneurysm is bleeding. He stopped taking anticoagulant medication Plavix prescribed by his former cardiologist. He is very interested in moving forward with surgery.On 03/25/22 seen last time by surgeon - Left carpal tunnel release, left hand, distal vessel aneurysm excision and repair of the vessel by Dr. Doran Durand 3/29/2023When I saw the patient last on 03/18/2022, [name]   ? Patient Stated Goals My left hand is my strong hand and need to use my walking stick to prevent me to fall.  As well as to work in my shop doing woodwork I use  mostly my left hand I am very dependent on my left hand   ? Currently in Pain? Yes   ? Pain Score 2    10/10 with making a fist  ? Pain Location Hand   ? Pain Orientation Left   ? Pain Descriptors / Indicators Stabbing   ? Pain Type Surgical pain   ? Pain Onset More than a month ago   ? Pain Frequency Constant   ? ?  ?  ? ?  ? ? ? ? [facility] OT Assessment - 03/31/22 0001   ? ?  ? Assessment  ? Medical Diagnosis L CTR/aneurism excision/vessel repair   ? Referring Provider (OT) Dr Cathie Hoops   ?  Onset Date/Surgical Date 03/04/22   ? Next MD Visit 04/10/22   ?  ? Precautions  ? Precautions Fall   Seizures  ?  ? Home  Environment  ? Lives With Spouse   ?  ? Prior Function  ? Vocation On disability   ? Leisure Has a dog, loves to do some woodwork and is shop.   ?  ? AROM  ? Left Wrist Extension 30 Degrees   ? Left Wrist Flexion 62 Degrees   ? Left Wrist Radial Deviation 25 Degrees   ? Left Wrist Ulnar Deviation 40 Degrees   ?  ? Left Hand AROM  ? L Thumb Radial ADduction/ABduction 0-55 50   ? L Thumb Palmar ADduction/ABduction 0-45 60   ? L Thumb Opposition to Index --   Like opposition 2 cm to fifth digit  ? L Index  MCP 0-90 70 Degrees   ? L Index PIP 0-100 90 Degrees   ? L Long  MCP 0-90 70 Degrees   -10 ext  ? L Long PIP 0-100 90 Degrees   ? L Ring  MCP 0-90 60 Degrees   ? L Ring PIP 0-100 90 Degrees   ? L Little  MCP 0-90 55 Degrees   ? L Little PIP 0-100 60 Degrees   ? ?  ?  ? ?  ? ? ? ? ? ? ?Contrast done with patient on left hand prior to review of home exercises.  Pain decreases and patient show increase mobility. ?Patient to do 2 times to 3 times a day home exercises keeping pain under 2/10 with a slight pull. ?Reinforced with patient that more is not better during home program focusing on range of motion no strength. ?Patient educated on desensitization of scar as well as scar massage around scab.  Using of Cica -Care scar pad at nighttime under Isotoner glove for swelling ?Patient light rolling over the red folder for  gentle massage and facilitation of digit extension prior to tendon glides active range of motion and blocked 10 reps each. ?Opposition picking up 1 cm foam block alternating digits 5 reps ?Palmar radial abduction

## 2022-04-02 ENCOUNTER — Encounter: Payer: Self-pay | Admitting: Occupational Therapy

## 2022-04-02 ENCOUNTER — Ambulatory Visit: Payer: BC Managed Care – PPO

## 2022-04-02 ENCOUNTER — Ambulatory Visit: Payer: BC Managed Care – PPO | Admitting: Occupational Therapy

## 2022-04-02 DIAGNOSIS — M25642 Stiffness of left hand, not elsewhere classified: Secondary | ICD-10-CM

## 2022-04-02 DIAGNOSIS — R42 Dizziness and giddiness: Secondary | ICD-10-CM

## 2022-04-02 DIAGNOSIS — M6281 Muscle weakness (generalized): Secondary | ICD-10-CM

## 2022-04-02 DIAGNOSIS — R262 Difficulty in walking, not elsewhere classified: Secondary | ICD-10-CM

## 2022-04-02 DIAGNOSIS — M79642 Pain in left hand: Secondary | ICD-10-CM

## 2022-04-02 DIAGNOSIS — R208 Other disturbances of skin sensation: Secondary | ICD-10-CM

## 2022-04-02 DIAGNOSIS — R2681 Unsteadiness on feet: Secondary | ICD-10-CM

## 2022-04-02 DIAGNOSIS — L905 Scar conditions and fibrosis of skin: Secondary | ICD-10-CM

## 2022-04-02 DIAGNOSIS — M25632 Stiffness of left wrist, not elsewhere classified: Secondary | ICD-10-CM

## 2022-04-02 NOTE — Therapy (Signed)
[place] ?[facility] ?[place], [place], [address] ?Phone: [phone]   Fax:  [phone] ? ?Physical Therapy Treatment ? ?Patient Details  ?Name: Cory Espinoza ?MRN: [id number] ?Date of Birth: [birth year] ?No data recorded ? ?Encounter Date: 04/02/2022 ? ? PT End of Session - 04/02/22 1532   ? ? Visit Number [id number]   ? Number of Visits 98   ? Date for PT Re-Evaluation 04/30/22   ? Authorization Type [employer]BS PPO;   ? Authorization Time Period 1/10 PN 4/20   ? PT Start Time [id number]   ? PT Stop Time [id number]   ? PT Time Calculation (min) 47 min   ? Equipment Utilized During Treatment Gait belt   ? Activity Tolerance Patient tolerated treatment well;Treatment limited secondary to medical complications (Comment)   provoked symptoms with certain activities  ? Behavior During Therapy [facility] for tasks assessed/performed   ? ?  ?  ? ?  ? ? ?Past Medical History:  ?Diagnosis Date  ? Allergy   ? Anginal pain ([place])   ? Anxiety   ? 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 Asthma   ? Atrial fibrillation ([place])   ? Back pain   ? Severe Lumbar Pain  ? CHF (congestive heart failure) ([place])   ? COPD (chronic obstructive pulmonary disease) ([place])   ? Restrictive lung disease  ? Coronary artery disease   ? Dyspnea   ? Dysrhythmia   ? afib  ? GERD (gastroesophageal reflux disease)   ? Headache   ? [name], onset October 2020, cluster headaches in the past  ? History of kidney stones   ? Hyperlipidemia   ? Hypertension   ? Myocardial infarction [facility])   ? at age 72  ? Pneumonia   ? Restrictive lung disease   ? Sleep apnea   ? unable to use cpap since onset of migraines  ? ? ?Past Surgical History:  ?Procedure Laterality Date  ? ABDOMINAL EXPOSURE N/A 03/25/2020  ? Procedure: ABDOMINAL EXPOSURE;  Surgeon: Rosetta Posner, MD;  Location: [facility] OR;  Service: Vascular;  Laterality: N/A;  ? ANTERIOR CERVICAL DECOMP/DISCECTOMY FUSION N/A 01/23/2020  ? Procedure: ANTERIOR CERVICAL DECOMPRESSION FUSION - CERVICAL  SIX-CERVICAL SEVEN;  Surgeon: Earnie Larsson, MD;  Location: [place];  Service: Neurosurgery;  Laterality: N/A;  ? ANTERIOR LUMBAR FUSION N/A 03/25/2020  ? Procedure: ANTERIOR LUMBAR INTERBODY FUSION LUMBAR FIVE-SACRAL ONE.;  Surgeon: Earnie Larsson, MD;  Location: [place];  Service: Neurosurgery;  Laterality: N/A;  anterior  ? [place]  ? CARDIAC CATHETERIZATION Left 04/27/2016  ? Procedure: Left Heart Cath and Coronary Angiography;  Surgeon: Corey Skains, MD;  Location: [place] CV LAB;  Service: Cardiovascular;  Laterality: Left;  ? CARDIAC CATHETERIZATION N/A 04/27/2016  ? Procedure: Intravascular Pressure Wire/FFR Study;  Surgeon: Yolonda Kida, MD;  Location: [place] CV LAB;  Service: Cardiovascular;  Laterality: N/A;  ? CATHETERIZATION OF PULMONARY ARTERY WITH RETRIEVAL OF FOREIGN BODY Bilateral 04/07/2011  ? heart  ? DEGENERATIVE SPONDYLOLISTHESIS    ? KNEE ARTHROSCOPY Bilateral 2004  ? [place] SURGERY  1999  ? RADICULOPATHY, CERVICAL REGION    ? TONSILLECTOMY    ? ? ?There were no vitals filed for this visit. ? ? Subjective Assessment - 04/02/22 1531   ? ? Subjective Patient had his OT session prior to PT today. Is now wearing hand sleeve for L hand. Reports high level of headache due  to weather.   ? Patient is accompained by: Family member   ? Pertinent History Pt has a complex medical history. 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 He states that he fell 10-12 feet and struck his head, neck, and back. He was able to finish the course which took him approximately 45 minutes more to complete. He does endorse a second fall while finishing the course but did not suffer a second head injury. He states that he started getting headaches that day which have persisted since that time. 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 He does report improvement in his headaches recently.  Neurology was concerned about possible BPPV and have referred him for a vestibular evaluation. In addition since the injury, pt developed RUE and RLE pain and weakness. Pt states that NCV showed abnormal nerve conduction in RUE. He has since undergone a C7-7 ACDF on 01/23/20. He reports initial improvement in RUE strength, but states that he has a gradual return of his RUE weakness. He was also having significant RLE weakness and pain and underwent and L5-S1 anterior lumbar interbody fusion on 03/25/20. Patient reports that he does not have any back precautions but needs to wear the low back brace for one more month. Patient reports he has a follow-up appointment with the surgeon next month. Patient reports he has a follow-up appointment with Dr. Brigitte Pulse, neurologist, in August. He arrives to therapy ambulating with an upright rollator walker. Pt denies any mention of concussion after his injury. He has been having cognitive issues since his injury and has previously had a heavy metals screen as well as neurocognitive testing. He has had an MRI of his brain with and without contrast on 12/18/19. Results were mildly motion degraded examination. No evidence of acute intracranial abnormality. Minimal chronic small vessel ischemic disease. He has tremor in both his hands. He has a positive family history of Parkinson's disease in his grandfather. He is complaining of constant dizziness and unsteadiness which are worse with activity. Patient states that he frequently loses his balance and his wife has to steady him.   ? Limitations Lifting;Standing;Walking;House hold activities   ? Diagnostic tests He has had an MRI of his brain with and without contrast on 12/18/19. Results were mildly motion degraded examination. No evidence of acute intracranial abnormality. Minimal chronic small vessel ischemic disease.   ? Patient Stated Goals to be able to ambulate with a cane or no AD, to be able to drive, to be able to ride his  motorcycle   ? Currently in Pain? Yes   ? Pain Score 8    ? Pain Location Head   ? Pain Descriptors / Indicators Stabbing   ? Pain Type Neuropathic pain   ? Pain Onset More than a month ago   ? Multiple Pain Sites Yes   ? Pain Score 5   ? Pain Location Back   back and neck  ? Pain Orientation Lower;Upper   ? Pain Descriptors / Indicators Aching   ? Pain Type Chronic pain   ? Pain Onset Yesterday   ? ?  ?  ? ?  ? ? ? ? ? ? ? ? ? ?Trigger Point Dry Needling (TDN), unbilled ?Education performed with patient regarding potential benefit of TDN. Reviewed precautions and risks with patient. Reviewed special precautions/risks over lung fields which include pneumothorax. Reviewed  signs and symptoms of pneumothorax and advised pt to go to ER immediately if these symptoms develop advise them of dry needling treatment. Extensive time spent with pt to ensure full understanding of TDN risks. Pt provided verbal consent to treatment. TDN performed to  with 0.3 x 30 single needle placements with local twitch response (LTR). Pistoning technique utilized. Improved pain-free motion following intervention. Muscles targeted: bilateral lower paraspinals, bilateral upper traps, bilateral cervical paraspinals x 18 minutes  ? ? ? ?GTB: ?-abduction 15x ?-adduction 15x ?-hamstring curl 15x ?-RUE row 15x ? ?Stand pivot transfer x 2 trials  ?  ?Ambulate >900 ft with PT from back room with lights off to car upstairs with cane in non injured hand and PT holding gait belt and injured hand's elbow for stabilization, frequent tripping over cane due to being on opposite side of patient's norm. One seated rest break due to severe dizziness ? Car transfer with min A for RLE  ? ?Patient presents with increased headache from changing weather as well as low back and neck pain. TDN performed with patient reporting immediate relief by end of session. Patient highly motivated throughout session despite his pain. He does require a seated rest break due to  dizziness and weakness. Patient will benefit from therapy to reduce fall risk, improve strength, and improve capacity for functional mobility for improved quality of life ? ? ? ? ? ? ? ? ? ? ? ? PT Education - 04/2

## 2022-04-02 NOTE — Therapy (Signed)
[place] ?[facility] ?[address] ?[place], [place], [address] ?Phone: [phone]   Fax:  [phone] ? ?Occupational Therapy Treatment ? ?Patient Details  ?Name: Cory Espinoza ?MRN: [id number] ?Date of Birth: 01-25-1971 ?Referring Provider (OT): Dr Cathie Hoops ? ? ?Encounter Date: 04/02/2022 ? ? OT End of Session - 04/02/22 1313   ? ? Visit Number 2   ? Number of Visits 16   ? Date for OT Re-Evaluation 05/26/22   ? OT Start Time 1315   ? OT Stop Time 1404   ? OT Time Calculation (min) 49 min   ? Activity Tolerance Patient tolerated treatment well   ? Behavior During Therapy [facility] for tasks assessed/performed   ? ?  ?  ? ?  ? ? ?Past Medical History:  ?Diagnosis Date  ? Allergy   ? Anginal pain (HCC)   ? Anxiety   ? 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 Asthma   ? Atrial fibrillation (HCC)   ? Back pain   ? Severe Lumbar Pain  ? CHF (congestive heart failure) (HCC)   ? COPD (chronic obstructive pulmonary disease) (HCC)   ? Restrictive lung disease  ? Coronary artery disease   ? Dyspnea   ? Dysrhythmia   ? afib  ? GERD (gastroesophageal reflux disease)   ? Headache   ? [name], onset October 2020, cluster headaches in the past  ? History of kidney stones   ? Hyperlipidemia   ? Hypertension   ? Myocardial infarction [facility])   ? at age 25  ? Pneumonia   ? Restrictive lung disease   ? Sleep apnea   ? unable to use cpap since onset of migraines  ? ? ?Past Surgical History:  ?Procedure Laterality Date  ? ABDOMINAL EXPOSURE N/A 03/25/2020  ? Procedure: ABDOMINAL EXPOSURE;  Surgeon: Larina Earthly, MD;  Location: [facility] OR;  Service: Vascular;  Laterality: N/A;  ? ANTERIOR CERVICAL DECOMP/DISCECTOMY FUSION N/A 01/23/2020  ? Procedure: ANTERIOR CERVICAL DECOMPRESSION FUSION - CERVICAL SIX-CERVICAL SEVEN;  Surgeon: Julio Sicks, MD;  [facility];  Service: Neurosurgery;  Laterality: N/A;  ? ANTERIOR LUMBAR FUSION N/A 03/25/2020  ? Procedure: ANTERIOR LUMBAR INTERBODY FUSION LUMBAR  FIVE-SACRAL ONE.;  Surgeon: Julio Sicks, MD;  Location: [facility] OR;  Service: Neurosurgery;  Laterality: N/A;  anterior  ? BACK SURGERY  1996  ? CARDIAC CATHETERIZATION Left 04/27/2016  ? Procedure: Left Heart Cath and Coronary Angiography;  Surgeon: Lamar Blinks, MD;  Location: [facility] INVASIVE CV LAB;  Service: Cardiovascular;  Laterality: Left;  ? CARDIAC CATHETERIZATION N/A 04/27/2016  ? Procedure: Intravascular Pressure Wire/FFR Study;  Surgeon: Alwyn Pea, MD;  Location: [facility] INVASIVE CV LAB;  Service: Cardiovascular;  Laterality: N/A;  ? CATHETERIZATION OF PULMONARY ARTERY WITH RETRIEVAL OF FOREIGN BODY Bilateral 04/07/2011  ? heart  ? DEGENERATIVE SPONDYLOLISTHESIS    ? KNEE ARTHROSCOPY Bilateral 2004  ? LUMBAR DISC SURGERY  1999  ? RADICULOPATHY, CERVICAL REGION    ? TONSILLECTOMY    ? ? ?There were no vitals filed for this visit. ? ? Subjective Assessment - 04/02/22 1313   ? ? Subjective  My hand is little tender today - maybe over done some of my exercises- but my hand feels so much looser after I do the hot and cold- wearing the glove   ? 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    Previous History:  02/09/2022: Per  Cory Brookes, MD  on 02/07/2022: "Mr. Cory Espinoza is a 51 y.o. 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 The patient has since developed a pulsating mass in the left palm which intermittently bleeds with a pulsating pattern. He was recently seen in the [facility] Room on 02/07/2022 where they had difficulty getting the bleeding under control. [facility] where the bleeding was under control and he was sent home. He is here for routine follow-up. He has previously been seen as an outpatient by Dr. Deeann SaintHoward Miller for this issue who ultimately had elected to proceed with nonoperative management. That visit was on 01/29/2022.     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 At his last visit, he was sent for an angiogram. He is here to discuss the results.    Today, the patient reports his aneurysm is bleeding. He stopped taking anticoagulant medication Plavix prescribed by his former cardiologist. He is very interested in moving forward with surgery.On 03/25/22 seen last time by surgeon - Left carpal tunnel release, left hand, distal vessel aneurysm excision and repair of the vessel by Dr. Doran DurandPigeon 3/29/2023When I saw the patient last on 03/18/2022, [name]   ? Patient Stated Goals My left hand is my strong hand and need to use my walking stick to prevent me to fall.  As well as to work in my shop doing woodwork I use mostly my left hand I am very dependent on my left hand   ? Currently in Pain? Yes   ? Pain Score 5    0/10 during day  ? Pain Location Hand   ? Pain  Orientation Left   ? Pain Descriptors / Indicators Stabbing   ? Pain Type Surgical pain   ? Pain Onset More than a month ago   ? Pain Frequency Constant   ? ?  ?  ? ?  ? ? ? ? ? [facility] OT Assessment - 04/02/22 0001   ? ?  ? AROM  ? Left Wrist Extension 60 Degrees   ? Left Wrist Flexion 90 Degrees   ? Left Wrist Radial Deviation 30 Degrees   ? Left Wrist Ulnar Deviation 45 Degrees   ?  ? Left Hand AROM  ? L Index  MCP 0-90 80 Degrees   ? L Index  PIP 0-100 95 Degrees   ? L Long  MCP 0-90 70 Degrees   ? L Long PIP 0-100 100 Degrees   ? L Ring  MCP 0-90 75 Degrees   ? L Ring PIP 0-100 90 Degrees   ? L Little  MCP 0-90 70 Degrees   ? L Little PIP 0-100 90 Degrees   ? ?  ?  ? ?  ? ? ? ?  Patient showed great progress in digit flexion and wrist flexion extension since 2 days ago with evaluation see flowsheet ? ? ? ? ? ? ? OT Treatments/Exercises (OP) - 04/02/22 0001   ? ?  ? LUE Contrast Bath  ? Time 8 minutes   ? Comments prior to AROM and scar massage   ? ?  ?  ? ?  ? ? ?Contrast done with patient on left hand prior to scar massage and active and active assisted range of motion to digits.  Pain and stiffness decreases and patient show increase mobility.  ? ?Done scar massage and reviewed with patient again desensitization of scar as well as scar massage around scab.  ?Scar massage done by OT manually as well as using mini massager.  Did use extractor over 1 area on ulnar side of scar with active range of motion and extension of digits tolerating well ?.  Using of Cica -Care scar pad at nighttime under Isotoner glove for swelling ?Patient light rolling over the red folder for gentle massage and facilitation of digit extension prior to tendon glides  ?Active assisted range of motion down to MC flexion and Undencyca fist followed by active range of motion and blocked MC flexion and intrinsic a fist  ?Followed by composite fist active assisted range to palm and to 2 cm from block  ?10 reps each. ?Opposition picking up 1 cm  foam block alternating digits 5 reps ?Patient still limited for opposition to fifth secondary to scar tissue. ?Palmar radial abduction of thumb 10 reps ?Wrist extension and flexion over edge of table no pressure on pa

## 2022-04-06 ENCOUNTER — Ambulatory Visit: Payer: BC Managed Care – PPO

## 2022-04-07 ENCOUNTER — Ambulatory Visit: Payer: BC Managed Care – PPO | Attending: Orthopedic Surgery | Admitting: Occupational Therapy

## 2022-04-07 DIAGNOSIS — M25632 Stiffness of left wrist, not elsewhere classified: Secondary | ICD-10-CM | POA: Diagnosis present

## 2022-04-07 DIAGNOSIS — R2681 Unsteadiness on feet: Secondary | ICD-10-CM | POA: Diagnosis present

## 2022-04-07 DIAGNOSIS — R208 Other disturbances of skin sensation: Secondary | ICD-10-CM | POA: Diagnosis present

## 2022-04-07 DIAGNOSIS — M6281 Muscle weakness (generalized): Secondary | ICD-10-CM | POA: Diagnosis present

## 2022-04-07 DIAGNOSIS — R262 Difficulty in walking, not elsewhere classified: Secondary | ICD-10-CM | POA: Insufficient documentation

## 2022-04-07 DIAGNOSIS — L905 Scar conditions and fibrosis of skin: Secondary | ICD-10-CM | POA: Insufficient documentation

## 2022-04-07 DIAGNOSIS — M79642 Pain in left hand: Secondary | ICD-10-CM | POA: Insufficient documentation

## 2022-04-07 DIAGNOSIS — M25642 Stiffness of left hand, not elsewhere classified: Secondary | ICD-10-CM | POA: Diagnosis present

## 2022-04-07 DIAGNOSIS — R42 Dizziness and giddiness: Secondary | ICD-10-CM | POA: Insufficient documentation

## 2022-04-07 NOTE — Therapy (Signed)
[place] ?[facility] ?[address] ?[place], [place], [address] ?Phone: [phone]   Fax:  [phone] ? ?Occupational Therapy Treatment ? ?Patient Details  ?Name: Cory Espinoza ?MRN: [id number] ?Date of Birth: [birth year] ?Referring Provider (OT): Dr Cathie Hoops ? ? ?Encounter Date: 04/07/2022 ? ? OT End of Session - 04/07/22 1733   ? ? Visit Number 3   ? Number of Visits 16   ? Date for OT Re-Evaluation 05/26/22   ? OT Start Time 1533   ? OT Stop Time 1622   ? OT Time Calculation (min) 49 min   ? Activity Tolerance Patient tolerated treatment well   ? Behavior During Therapy [facility] for tasks assessed/performed   ? ?  ?  ? ?  ? ? ?Past Medical History:  ?Diagnosis Date  ? Allergy   ? Anginal pain (HCC)   ? Anxiety   ? 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 Asthma   ? Atrial fibrillation (HCC)   ? Back pain   ? Severe Lumbar Pain  ? CHF (congestive heart failure) (HCC)   ? COPD (chronic obstructive pulmonary disease) (HCC)   ? Restrictive lung disease  ? Coronary artery disease   ? Dyspnea   ? Dysrhythmia   ? afib  ? GERD (gastroesophageal reflux disease)   ? Headache   ? [name], onset October 2020, cluster headaches in the past  ? History of kidney stones   ? Hyperlipidemia   ? Hypertension   ? Myocardial infarction [facility])   ? at age 37  ? Pneumonia   ? Restrictive lung disease   ? Sleep apnea   ? unable to use cpap since onset of migraines  ? ? ?Past Surgical History:  ?Procedure Laterality Date  ? ABDOMINAL EXPOSURE N/A 03/25/2020  ? Procedure: ABDOMINAL EXPOSURE;  Surgeon: Larina Earthly, MD;  Location: [facility] OR;  Service: Vascular;  Laterality: N/A;  ? ANTERIOR CERVICAL DECOMP/DISCECTOMY FUSION N/A 01/23/2020  ? Procedure: ANTERIOR CERVICAL DECOMPRESSION FUSION - CERVICAL SIX-CERVICAL SEVEN;  Surgeon: Julio Sicks, MD;  Location: [facility] OR;  Service: Neurosurgery;  Laterality: N/A;  ? ANTERIOR LUMBAR FUSION N/A 03/25/2020  ? Procedure: ANTERIOR LUMBAR INTERBODY FUSION LUMBAR  FIVE-SACRAL ONE.;  Surgeon: Julio Sicks, MD;  Location: [facility] OR;  Service: Neurosurgery;  Laterality: N/A;  anterior  ? BACK SURGERY  1996  ? CARDIAC CATHETERIZATION Left 04/27/2016  ? Procedure: Left Heart Cath and Coronary Angiography;  Surgeon: Lamar Blinks, MD;  Location: [facility] INVASIVE CV LAB;  Service: Cardiovascular;  Laterality: Left;  ? CARDIAC CATHETERIZATION N/A 04/27/2016  ? Procedure: Intravascular Pressure Wire/FFR Study;  Surgeon: Alwyn Pea, MD;  Location: [facility] INVASIVE CV LAB;  Service: Cardiovascular;  Laterality: N/A;  ? CATHETERIZATION OF PULMONARY ARTERY WITH RETRIEVAL OF FOREIGN BODY Bilateral 04/07/2011  ? heart  ? DEGENERATIVE SPONDYLOLISTHESIS    ? KNEE ARTHROSCOPY Bilateral 2004  ? LUMBAR DISC SURGERY  1999  ? RADICULOPATHY, CERVICAL REGION    ? TONSILLECTOMY    ? ? ?There were no vitals filed for this visit. ? ? Subjective Assessment - 04/07/22 1731   ? ? Subjective  I was doing good but then I fell yesterday patient had really bad migraines and headache for 3 days" myself and at hand by accident.  Since then had a burning pain in my wrist.  But look I can grip and pull up and push-up on my stick with the left hand   ?  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    Previous History:  02/09/2022: Per  [name], MD on 02/07/2022: "Mr. Cory Espinoza is a 51 y.o. 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 Presented to OSH for evaluation of continued bleeding from the site. Bleeding has been intermittent since injury. Was recently seen in outside orthopaedic clinic 2/15 and decision was to manage nonoperatively. States that today started spontaneously "spurting blood." No recent injuries or trauma. He is on aspirin and Plavix.  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 The patient has since developed a pulsating mass in the left palm which intermittently bleeds with a pulsating pattern. He was recently seen in the [facility] Emergency Room on 02/07/2022 where they had difficulty getting the bleeding under control. He was transferred to the [facility] where the bleeding was under control and he was sent home. He is here for routine follow-up. He has previously been seen as an outpatient by Dr. Deeann Saint for this issue who ultimately had elected to proceed with nonoperative management. That visit was on 01/29/2022.     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 At his last visit, he was sent for an angiogram. He is here to discuss the results.    Today, the patient reports his aneurysm is bleeding. He stopped taking anticoagulant medication Plavix prescribed by his former cardiologist. He is very interested in moving forward with surgery.On 03/25/22 seen last time by surgeon - Left carpal tunnel release, left hand, distal vessel aneurysm excision and repair of the vessel by Dr. Doran Durand 3/29/2023When I saw the patient last on 03/18/2022, [name]   ? Patient Stated Goals My left hand is my strong hand and need to use my walking stick to prevent me to fall.  As well as to work in my shop doing woodwork I use mostly my left hand I am very dependent on my left  hand   ? Currently in Pain? Yes   ? Pain Score 5    ? Pain Location Wrist   ? Pain Descriptors / Indicators Burning   ? Pain Type Neuropathic pain;Acute pain   ? Pain Onset In the past 7 days   ? Pain Frequency Constant   ? ?  ?  ? ?  ? ? ? ? ? ? ? ? ? ? ? ? ? ? ? OT Treatments/Exercises (OP) - 04/07/22 0001   ? ?  ? Ultrasound  ? Ultrasound Location volar wrist /CT   ? Ultrasound Parameters 3. , 1.0 , 20 %   ? Ultrasound Goals Pain   and edema  ?  ? LUE Contrast  Bath  ? Time 11 minutes   ? Comments decrease pain and prior to ROM and   ? ?  ?  ? ?  ? ?Contrast done with patient on left hand for longer time to decrease burning pain and irritation of carpal tunnel and volar wrist.  Patient report he fell over the weekend because of his severe headaches and migraine.  Include himself and his hand.  Continue with same home exercise program since then as well as can grip and pull up on his pain now with the left hand.   ?Reinforced with patient importance of doing contrast several times during the day and holding off on any scar massage, scar mobilization, home exercise program for range of motion as well as gripping to calm down the nerve.   ?Patient with increased swelling over the volar wrist  ?and palm.    ?  ?. Using of Cica -Care scar pad at nighttime under Isotoner glove for swelling ?Reviewed with patient to continue to do active range of motion tendon glides pain-free if pain is less than 3/10.  Otherwise needs to hold off on it for 24 to 48 hours and only do contrast.   ?Opposition picking up 1 cm foam block alternating digits 5 reps ?Patient still limited for opposition to fifth secondary to scar tissue but improved to about 1 cm ?Palmar and radial abduction of thumb 10 reps ?Patient to hold off on any wrist exercises ? ? ? ? ? ? OT Education - 04/07/22 1733   ? ? Education Details progress changes in home program   ? Person(s) Educated Patient;Spouse   ? Methods Explanation;Demonstration;Tactile  cues;Verbal cues;Handout   ? Comprehension Verbal cues required;Returned demonstration;Verbalized understanding   ? ?  ?  ? ?  ? ? ? OT Short Term Goals - 03/31/22 1808   ? ?  ? OT SHORT TERM GOAL #1  ? Title Patient to be

## 2022-04-09 ENCOUNTER — Ambulatory Visit: Payer: BC Managed Care – PPO | Admitting: Occupational Therapy

## 2022-04-09 ENCOUNTER — Ambulatory Visit: Payer: BC Managed Care – PPO

## 2022-04-09 DIAGNOSIS — L905 Scar conditions and fibrosis of skin: Secondary | ICD-10-CM | POA: Diagnosis not present

## 2022-04-09 DIAGNOSIS — R262 Difficulty in walking, not elsewhere classified: Secondary | ICD-10-CM

## 2022-04-09 DIAGNOSIS — M79642 Pain in left hand: Secondary | ICD-10-CM

## 2022-04-09 DIAGNOSIS — R208 Other disturbances of skin sensation: Secondary | ICD-10-CM

## 2022-04-09 DIAGNOSIS — M6281 Muscle weakness (generalized): Secondary | ICD-10-CM

## 2022-04-09 DIAGNOSIS — M25632 Stiffness of left wrist, not elsewhere classified: Secondary | ICD-10-CM

## 2022-04-09 DIAGNOSIS — M25642 Stiffness of left hand, not elsewhere classified: Secondary | ICD-10-CM

## 2022-04-09 DIAGNOSIS — R2681 Unsteadiness on feet: Secondary | ICD-10-CM

## 2022-04-09 NOTE — Therapy (Signed)
[place] ?[facility] ?[address] ?[place], [place], [address] ?Phone: [phone]   Fax:  [phone] ? ?Physical Therapy Treatment ? ?Patient Details  ?Name: Cory Espinoza ?MRN: [id number] ?Date of Birth: November 05, 1971 ?No data recorded ? ?Encounter Date: 04/09/2022 ? ? PT End of Session - 04/09/22 1515   ? ? Visit Number 82   ? Number of Visits 98   ? Date for PT Re-Evaluation 04/30/22   ? Authorization Type BCBS PPO;   ? Authorization Time Period 2/10 PN 4/20   ? PT Start Time 1515   ? PT Stop Time 1559   ? PT Time Calculation (min) 44 min   ? Equipment Utilized During Treatment Gait belt   ? Activity Tolerance Patient tolerated treatment well;Treatment limited secondary to medical complications (Comment)   provoked symptoms with certain activities  ? Behavior During Therapy [facility] for tasks assessed/performed   ? ?  ?  ? ?  ? ? ?Past Medical History:  ?Diagnosis Date  ? Allergy   ? Anginal pain (HCC)   ? Anxiety   ? 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 Asthma   ? Atrial fibrillation (HCC)   ? Back pain   ? Severe Lumbar Pain  ? CHF (congestive heart failure) (HCC)   ? COPD (chronic obstructive pulmonary disease) (HCC)   ? Restrictive lung disease  ? Coronary artery disease   ? Dyspnea   ? Dysrhythmia   ? afib  ? GERD (gastroesophageal reflux disease)   ? Headache   ? [name], onset October 2020, cluster headaches in the past  ? History of kidney stones   ? Hyperlipidemia   ? Hypertension   ? Myocardial infarction [facility])   ? at age 36  ? Pneumonia   ? Restrictive lung disease   ? Sleep apnea   ? unable to use cpap since onset of migraines  ? ? ?Past Surgical History:  ?Procedure Laterality Date  ? ABDOMINAL EXPOSURE N/A 03/25/2020  ? Procedure: ABDOMINAL EXPOSURE;  Surgeon: Larina Earthly, MD;  Location: [facility] OR;  Service: Vascular;  Laterality: N/A;  ? ANTERIOR CERVICAL DECOMP/DISCECTOMY FUSION N/A 01/23/2020  ? Procedure: ANTERIOR CERVICAL DECOMPRESSION FUSION - CERVICAL  SIX-CERVICAL SEVEN;  Surgeon: Julio Sicks, MD;  Location: [facility] OR;  Service: Neurosurgery;  Laterality: N/A;  ? ANTERIOR LUMBAR FUSION N/A 03/25/2020  ? Procedure: ANTERIOR LUMBAR INTERBODY FUSION LUMBAR FIVE-SACRAL ONE.;  Surgeon: Julio Sicks, MD;  [facility]: [facility] OR;  Service: Neurosurgery;  Laterality: N/A;  anterior  ? BACK SURGERY  1996  ? CARDIAC CATHETERIZATION Left 04/27/2016  ? Procedure: Left Heart Cath and Coronary Angiography;  Surgeon: Lamar Blinks, MD;  Location: [facility] INVASIVE CV LAB;  Service: Cardiovascular;  Laterality: Left;  ? CARDIAC CATHETERIZATION N/A 04/27/2016  ? Procedure: Intravascular Pressure Wire/FFR Study;  Surgeon: Alwyn Pea, MD;  Location: [facility] INVASIVE CV LAB;  Service: Cardiovascular;  Laterality: N/A;  ? CATHETERIZATION OF PULMONARY ARTERY WITH RETRIEVAL OF FOREIGN BODY Bilateral 04/07/2011  ? heart  ? DEGENERATIVE SPONDYLOLISTHESIS    ? KNEE ARTHROSCOPY Bilateral 2004  ? LUMBAR DISC SURGERY  1999  ? RADICULOPATHY, CERVICAL REGION    ? TONSILLECTOMY    ? ? ?There were no vitals filed for this visit. ? ? Subjective Assessment - 04/09/22 1603   ? ? Subjective Patient presents without glasses today, reports it is a good day. Did have a fall since seen last.   ? Patient is  accompained by: Family member   ? Pertinent History Pt has a complex medical history. 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 He states that he fell 10-12 feet and struck his head, neck, and back. He was able to finish the course which took him approximately 45 minutes more to complete. He does endorse a second fall while finishing the course but did not suffer a second head injury. He states that he started getting headaches that day which have persisted since that time. 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 He does report improvement in his headaches recently. Neurology was concerned about  possible BPPV and have referred him for a vestibular evaluation. In addition since the injury, pt developed RUE and RLE pain and weakness. Pt states that NCV showed abnormal nerve conduction in RUE. He has since undergone a C7-7 ACDF on 01/23/20. He reports initial improvement in RUE strength, but states that he has a gradual return of his RUE weakness. He was also having significant RLE weakness and pain and underwent and L5-S1 anterior lumbar interbody fusion on 03/25/20. Patient reports that he does not have any back precautions but needs to wear the low back brace for one more month. Patient reports he has a follow-up appointment with the surgeon next month. Patient reports he has a follow-up appointment with Dr. Clelia CroftShaw, neurologist, in August. He arrives to therapy ambulating with an upright rollator walker. Pt denies any mention of concussion after his injury. He has been having cognitive issues since his injury and has previously had a heavy metals screen as well as neurocognitive testing. He has had an MRI of his brain with and without contrast on 12/18/19. Results were mildly motion degraded examination. No evidence of acute intracranial abnormality. Minimal chronic small vessel ischemic disease. He has tremor in both his hands. He has a positive family history of Parkinson's disease in his grandfather. He is complaining of constant dizziness and unsteadiness which are worse with activity. Patient states that he frequently loses his balance and his wife has to steady him.   ? Limitations Lifting;Standing;Walking;House hold activities   ? Diagnostic tests He has had an MRI of his brain with and without contrast on 12/18/19. Results were mildly motion degraded examination. No evidence of acute intracranial abnormality. Minimal chronic small vessel ischemic disease.   ? Patient Stated Goals to be able to ambulate with a cane or no AD, to be able to drive, to be able to ride his motorcycle   ? Currently in Pain? Yes    ? Pain Score 4    ? Pain Location Head   ? Pain Orientation Mid   ? Pain Descriptors / Indicators Aching   ? Pain Type Chronic pain;Neuropathic pain   ? Pain Onset More than a month ago   ? Pain Onset Yesterday   ? ?  ?  ? ?  ? ? ? ? ? ?standing: ?Stand without UE support 30 seconds ?Stand without UE support narrow BOS 60 seconds x2 near LOB ?Stand with focus at target 60 seconds ?Stand with head turn to L return to straight forward staring at target x 2; increased dizziness and migraine ?Seated turn head to L and return to target x 2 (added to HEP) ? ?Seated: ?3lb ankle weight: ?-LAQ 15x each LE ?-March 15x each LE ?GTB: ?-abduction 15x BLE ?-adduction 15x BLE ?-RUE  row 15x ?-straight arm lat pulldown RUE 15x ? ?STM to bilateral upper traps x 8 minutes for pain reduction and migraine reduction  ? ?Pt educated throughout session about proper posture and technique with exercises. Improved exercise technique, movement at target joints, use of target muscles after min to mod verbal, visual, tactile cues. ? ? ? ?Patient initially presents having a good day however with interventions utilizing head turns he does have an increase in migraine. Patient does tolerates strengthening interventions without increase in migraine. Adding head turns to HEP in seated position. Patient will benefit from therapy to reduce fall risk, improve strength, and improve capacity for functional mobility for improved quality of life ? ? ? ? ? ? ? ? ? ? ? ? ? ? ? ? ? ? ? ? PT Education - 04/09/22 1514   ? ? Education Details exercise technique, body mechanics   ? Person(s) Educated Patient   ? Methods Explanation;Demonstration;Tactile cues;Verbal cues   ? Comprehension Other (comment);Verbalized understanding;Returned demonstration;Verbal cues required;Tactile cues required   ? ?  ?  ? ?  ? ? ? PT Short Term Goals - 03/26/22 1602   ? ?  ? PT SHORT TERM GOAL #1  ? Title Patient will be independent in home exercise program to improve  strength/mobility for better functional independence with ADLs and for self-management.   ? Baseline 3/3 HEP compliant 3/31 hep compliant   ? Time 6   ? Period Weeks   ? Status Achieved   ? Target Date 02/20/21   ?  ?

## 2022-04-09 NOTE — Therapy (Signed)
[place] ?[facility] ?[address] ?[place], [place], [address] ?Phone: [phone]   Fax:  [phone] ? ?Occupational Therapy Treatment ? ?Patient Details  ?Name: Cory Espinoza ?MRN: [id number] ?Date of Birth: 11-18-1971 ?Referring Provider (OT): Dr Tasia Catchings ? ? ?Encounter Date: 04/09/2022 ? ? OT End of Session - 04/09/22 1424   ? ? Visit Number 4   ? Number of Visits 16   ? Date for OT Re-Evaluation 05/26/22   ? OT Start Time 1405   ? OT Stop Time 1450   ? OT Time Calculation (min) 45 min   ? Activity Tolerance Patient tolerated treatment well   ? Behavior During Therapy [facility] for tasks assessed/performed   ? ?  ?  ? ?  ? ? ?Past Medical History:  ?Diagnosis Date  ? Allergy   ? Anginal pain ([place])   ? Anxiety   ? 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 Asthma   ? Atrial fibrillation ([place])   ? Back pain   ? Severe Lumbar Pain  ? CHF (congestive heart failure) ([place])   ? COPD (chronic obstructive pulmonary disease) ([place])   ? Restrictive lung disease  ? Coronary artery disease   ? Dyspnea   ? Dysrhythmia   ? afib  ? GERD (gastroesophageal reflux disease)   ? Headache   ? [name], onset October 2020, cluster headaches in the past  ? History of kidney stones   ? Hyperlipidemia   ? Hypertension   ? Myocardial infarction [facility])   ? at age 16  ? Pneumonia   ? Restrictive lung disease   ? Sleep apnea   ? unable to use cpap since onset of migraines  ? ? ?Past Surgical History:  ?Procedure Laterality Date  ? ABDOMINAL EXPOSURE N/A 03/25/2020  ? Procedure: ABDOMINAL EXPOSURE;  Surgeon: Rosetta Posner, MD;  Location: [facility] OR;  Service: Vascular;  Laterality: N/A;  ? ANTERIOR CERVICAL DECOMP/DISCECTOMY FUSION N/A 01/23/2020  ? Procedure: ANTERIOR CERVICAL DECOMPRESSION FUSION - CERVICAL SIX-CERVICAL SEVEN;  Surgeon: Earnie Larsson, MD;  Location: [place];  Service: Neurosurgery;  Laterality: N/A;  ? ANTERIOR LUMBAR FUSION N/A 03/25/2020  ? Procedure: ANTERIOR LUMBAR INTERBODY FUSION LUMBAR  FIVE-SACRAL ONE.;  Surgeon: Earnie Larsson, MD;  Location: [place];  Service: Neurosurgery;  Laterality: N/A;  anterior  ? [place]  ? CARDIAC CATHETERIZATION Left 04/27/2016  ? Procedure: Left Heart Cath and Coronary Angiography;  Surgeon: Corey Skains, MD;  Location: [place] CV LAB;  Service: Cardiovascular;  Laterality: Left;  ? CARDIAC CATHETERIZATION N/A 04/27/2016  ? Procedure: Intravascular Pressure Wire/FFR Study;  Surgeon: Yolonda Kida, MD;  Location: [place] CV LAB;  Service: Cardiovascular;  Laterality: N/A;  ? CATHETERIZATION OF PULMONARY ARTERY WITH RETRIEVAL OF FOREIGN BODY Bilateral 04/07/2011  ? heart  ? DEGENERATIVE SPONDYLOLISTHESIS    ? KNEE ARTHROSCOPY Bilateral 2004  ? [place] SURGERY  1999  ? RADICULOPATHY, CERVICAL REGION    ? TONSILLECTOMY    ? ? ?There were no vitals filed for this visit. ? ? Subjective Assessment - 04/09/22 1422   ? ? Subjective  I am having good day - forgot my sun glasses - but my hand and wrist not good- the burning pain is about 9/10 today   ? 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    Previous History:  02/09/2022: Per  Varney Baas, MD on 02/07/2022: "Mr. Valerie Roys  is a 51 y.o. 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 The patient has since developed a pulsating mass in the left palm which intermittently bleeds with a pulsating pattern. He was recently seen in the [facility] Emergency Room on 02/07/2022 where they had difficulty getting the bleeding under control. He was transferred to the [facility] where the bleeding was under control and he was sent home. He is here for routine follow-up. He has previously been seen as an outpatient by Dr. Earnestine Leys for this issue who ultimately had elected to proceed with nonoperative management. That visit was on 01/29/2022.     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 At his last visit, he was sent for an angiogram. He is here to discuss the results.    Today, the patient reports his aneurysm is bleeding. He stopped taking anticoagulant medication Plavix prescribed by his former cardiologist. He is very interested in moving forward with surgery.On 03/25/22 seen last time by surgeon - Left carpal tunnel release, left hand, distal vessel aneurysm excision and repair of the vessel by Dr. Vanessa Barbara 3/29/2023When I saw the patient last on 03/18/2022, [name]   ? Patient Stated Goals My left hand is my strong hand and need to use my walking stick to prevent me to fall.  As well as to work in my shop doing woodwork I use mostly my left hand I am very dependent on my left hand   ? Currently in Pain? Yes   ? Pain Score 9    ? Pain Location Wrist   ? Pain Orientation Left   ? Pain Descriptors / Indicators  Burning   ? Pain Type Neuropathic pain   ? Pain Onset More than a month ago   ? Pain Frequency Constant   ? ?  ?  ? ?  ? ? ? ? ? ? ? ? ? ? ? ? ? ? ? OT Treatments/Exercises (OP) - 04/09/22 0001   ? ?  ? LUE Contrast [place]  ? Time 11 minutes   ? Comments decrease pain   ? ?  ?  ? ?  ? ? ?  ?Contrast done with patient on left hand for longer time to decrease burning pain and irritation of carpal tunnel and volar wrist -decreased from a 9 to a  7/10.  ?Since he fell over the weekend because of his severe headaches and migraine.  He called himself and his hand.  Since Tuesday visit patient was doing contrast but also did range of motion that continue to irritate the medial nerve. ?At this date because of thick scar tissue patient compensating with wrist flexion during even tendon glides.  Reinforced with patient to only do scar massage on distal part that is nontender. ?And tendon glides with wearing his prefab wrist splint. ?Patient fitted with a prefab wrist splint to wear most of the time as well as during tendon glides.  Hold off on any wrist motion can do light thumb active range of motion to contrast several times during the day can take it off for that. ?Did do some soft tissue and scar massage with patient more distally on scar around Scap and distal palmar crease.  Use extractor as well as mini massager without irritation hold off on any proximal scar massage. ?  ?. Using of Cica -Care scar pad at nighttime under Isotoner glove for swelling ?Patient's priority until next visit is to decrease pain gradually over the next few days using contrast, Isotoner glove, wrist splint. ? ? ? ? ? OT Education - 04/09/22 1457   ? ? Education Details Home program changes as well as nerve healing   ? Person(s) Educated Patient;Parent(s)   ? Methods Explanation;Demonstration;Tactile cues;Verbal cues;Handout   ? Comprehension Verbal cues required;Returned demonstration;Verbalized understanding   ? ?  ?  ? ?  ? ? ? OT Short Term  Goals - 03/31/22 1808   ? ?  ? OT SHORT TERM GOAL #1  ? Title Patient to be independent in home program to decrease edema, decreased pain as well as scar tissue to increase digit flexion to touching palm.   ? Baseline

## 2022-04-13 ENCOUNTER — Ambulatory Visit: Payer: BC Managed Care – PPO

## 2022-04-13 DIAGNOSIS — R2681 Unsteadiness on feet: Secondary | ICD-10-CM

## 2022-04-13 DIAGNOSIS — L905 Scar conditions and fibrosis of skin: Secondary | ICD-10-CM | POA: Diagnosis not present

## 2022-04-13 DIAGNOSIS — M6281 Muscle weakness (generalized): Secondary | ICD-10-CM

## 2022-04-13 DIAGNOSIS — R262 Difficulty in walking, not elsewhere classified: Secondary | ICD-10-CM

## 2022-04-13 DIAGNOSIS — R42 Dizziness and giddiness: Secondary | ICD-10-CM

## 2022-04-13 NOTE — Therapy (Signed)
[place] ?[facility] ?[address] ?[place], [place], [address] ?Phone: [phone]   Fax:  [phone] ? ?Physical Therapy Treatment ? ?Patient Details  ?Name: Cory Espinoza ?MRN: [id number] ?Date of Birth: 05-25-1971 ?No data recorded ? ?Encounter Date: 04/13/2022 ? ? PT End of Session - 04/13/22 1519   ? ? Visit Number 83   ? Number of Visits 98   ? Date for PT Re-Evaluation 04/30/22   ? Authorization Type BCBS PPO;   ? Authorization Time Period 3/10 PN 4/20   ? PT Start Time 1515   ? PT Stop Time 1559   ? PT Time Calculation (min) 44 min   ? Equipment Utilized During Treatment Gait belt   ? Activity Tolerance Patient tolerated treatment well;Treatment limited secondary to medical complications (Comment)   provoked symptoms with certain activities  ? Behavior During Therapy [facility] for tasks assessed/performed   ? ?  ?  ? ?  ? ? ?Past Medical History:  ?Diagnosis Date  ? Allergy   ? Anginal pain (HCC)   ? Anxiety   ? 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 Asthma   ? Atrial fibrillation (HCC)   ? Back pain   ? Severe Lumbar Pain  ? CHF (congestive heart failure) (HCC)   ? COPD (chronic obstructive pulmonary disease) (HCC)   ? Restrictive lung disease  ? Coronary artery disease   ? Dyspnea   ? Dysrhythmia   ? afib  ? GERD (gastroesophageal reflux disease)   ? Headache   ? [name], onset October 2020, cluster headaches in the past  ? History of kidney stones   ? Hyperlipidemia   ? Hypertension   ? Myocardial infarction [facility])   ? at age 52  ? Pneumonia   ? Restrictive lung disease   ? Sleep apnea   ? unable to use cpap since onset of migraines  ? ? ?Past Surgical History:  ?Procedure Laterality Date  ? ABDOMINAL EXPOSURE N/A 03/25/2020  ? Procedure: ABDOMINAL EXPOSURE;  Surgeon: Larina Earthly, MD;  Location: [facility] OR;  Service: Vascular;  Laterality: N/A;  ? ANTERIOR CERVICAL DECOMP/DISCECTOMY FUSION N/A 01/23/2020  ? Procedure: ANTERIOR CERVICAL DECOMPRESSION FUSION - CERVICAL  SIX-CERVICAL SEVEN;  Surgeon: Julio Sicks, MD;  Location: [facility] OR;  Service: Neurosurgery;  Laterality: N/A;  ? ANTERIOR LUMBAR FUSION N/A 03/25/2020  ? Procedure: ANTERIOR LUMBAR INTERBODY FUSION LUMBAR FIVE-SACRAL ONE.;  Surgeon: Julio Sicks, MD;  Location: [facility] OR;  Service: Neurosurgery;  Laterality: N/A;  anterior  ? BACK SURGERY  1996  ? CARDIAC CATHETERIZATION Left 04/27/2016  ? Procedure: Left Heart Cath and Coronary Angiography;  Surgeon: Lamar Blinks, MD;  Location: [facility] INVASIVE CV LAB;  Service: Cardiovascular;  Laterality: Left;  ? CARDIAC CATHETERIZATION N/A 04/27/2016  ? Procedure: Intravascular Pressure Wire/FFR Study;  Surgeon: Alwyn Pea, MD;  Location: [facility] INVASIVE CV LAB;  Service: Cardiovascular;  Laterality: N/A;  ? CATHETERIZATION OF PULMONARY ARTERY WITH RETRIEVAL OF FOREIGN BODY Bilateral 04/07/2011  ? heart  ? DEGENERATIVE SPONDYLOLISTHESIS    ? KNEE ARTHROSCOPY Bilateral 2004  ? LUMBAR DISC SURGERY  1999  ? RADICULOPATHY, CERVICAL REGION    ? TONSILLECTOMY    ? ? ?There were no vitals filed for this visit. ? ? Subjective Assessment - 04/13/22 1518   ? ? Subjective Patient reports headaches are at a 6-7/10; was worse this morning. Had LLE buckle but was able to catch self.   ?  Patient is accompained by: Family member   ? Pertinent History Pt has a complex medical history. 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 He states that he fell 10-12 feet and struck his head, neck, and back. He was able to finish the course which took him approximately 45 minutes more to complete. He does endorse a second fall while finishing the course but did not suffer a second head injury. He states that he started getting headaches that day which have persisted since that time. 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 He does report improvement in his headaches recently. Neurology was concerned  about possible BPPV and have referred him for a vestibular evaluation. In addition since the injury, pt developed RUE and RLE pain and weakness. Pt states that NCV showed abnormal nerve conduction in RUE. He has since undergone a C7-7 ACDF on 01/23/20. He reports initial improvement in RUE strength, but states that he has a gradual return of his RUE weakness. He was also having significant RLE weakness and pain and underwent and L5-S1 anterior lumbar interbody fusion on 03/25/20. Patient reports that he does not have any back precautions but needs to wear the low back brace for one more month. Patient reports he has a follow-up appointment with the surgeon next month. Patient reports he has a follow-up appointment with Dr. Clelia CroftShaw, neurologist, in August. He arrives to therapy ambulating with an upright rollator walker. Pt denies any mention of concussion after his injury. He has been having cognitive issues since his injury and has previously had a heavy metals screen as well as neurocognitive testing. He has had an MRI of his brain with and without contrast on 12/18/19. Results were mildly motion degraded examination. No evidence of acute intracranial abnormality. Minimal chronic small vessel ischemic disease. He has tremor in both his hands. He has a positive family history of Parkinson's disease in his grandfather. He is complaining of constant dizziness and unsteadiness which are worse with activity. Patient states that he frequently loses his balance and his wife has to steady him.   ? Limitations Lifting;Standing;Walking;House hold activities   ? Diagnostic tests He has had an MRI of his brain with and without contrast on 12/18/19. Results were mildly motion degraded examination. No evidence of acute intracranial abnormality. Minimal chronic small vessel ischemic disease.   ? Patient Stated Goals to be able to ambulate with a cane or no AD, to be able to drive, to be able to ride his motorcycle   ? Currently in  Pain? Yes   ? Pain Score 7    ? Pain Location Head   ? Pain Orientation Medial   ? Pain Descriptors / Indicators Aching   ? Pain Type Chronic pain   ? Pain Onset More than a month ago   ? Pain Frequency Constant   ? Pain Onset Yesterday   ? ?  ?  ? ?  ? ? ? ? ? ?  ? Patient reports headaches are at a 6-7/10; was worse this morning. Had LLE buckle but was able to catch self.  ? ?in room with lights off and noise damped due to migraine.  ?  ?  ?TREATMENT ?  ?  ?3lb ankle weight: ?-LAQ 15x each LE ?-march 15x each LE ?-hip abduction/adduction  15x each LE ?-heel raise 20x ?  ?  ?GTB: ?-adduction  15x each LE ?-hamstring curl 15x each LE  ?-RUE row 15x with PT guidance for sequencing and scap squeeze ?-RUE IR 12x  ?-RUE ER 12x  ?  ?4lb dumbbell: ?Bicep curl 10x RUE ?Bent row 10x RUE ?Wrist flexion 10x RUE  ?  ?Pt educated throughout session about proper posture and technique with exercises. Improved exercise technique, movement at target joints, use of target muscles after min to mod verbal, visual, tactile cues. ?  ?  ?Patient tolerates progressive strengthening of LE's and Ue's. Discussed upcoming recert with potential decrease to 1x/week for POC. Patient has been compliant with his head turn HEP. Patient will benefit from therapy to reduce fall risk, improve strength, and improve capacity for functional mobility for improved quality of life ? ? ? ? ? ? ? ? ? ? ? ? ? ? ? ? ? ? ? ? ? ? PT Education - 04/13/22 1519   ? ? Education Details exercise technique, body mechanics   ? Person(s) Educated Patient   ? Methods Explanation;Demonstration;Verbal cues;Tactile cues   ? Comprehension Verbalized understanding;Verbal cues required;Tactile cues required;Returned demonstration   ? ?  ?  ? ?  ? ? ? PT Short Term Goals - 03/26/22 1602   ? ?  ? PT SHORT TERM GOAL #1  ? Title Patient will be independent in home exercise program to improve strength/mobility for better functional independence with ADLs and for self-management.   ?  Baseline 3/3 HEP compliant 3/31 hep compliant   ? Time 6   ? Period Weeks   ? Status Achieved   ? Target Date 02/20/21   ?  ? PT SHORT TERM GOAL #2  ? Title Patient will be modified independent in walking

## 2022-04-14 ENCOUNTER — Ambulatory Visit: Payer: BC Managed Care – PPO | Admitting: Occupational Therapy

## 2022-04-16 ENCOUNTER — Ambulatory Visit: Payer: BC Managed Care – PPO

## 2022-04-16 DIAGNOSIS — L905 Scar conditions and fibrosis of skin: Secondary | ICD-10-CM | POA: Diagnosis not present

## 2022-04-16 DIAGNOSIS — R262 Difficulty in walking, not elsewhere classified: Secondary | ICD-10-CM

## 2022-04-16 DIAGNOSIS — R2681 Unsteadiness on feet: Secondary | ICD-10-CM

## 2022-04-16 DIAGNOSIS — M6281 Muscle weakness (generalized): Secondary | ICD-10-CM

## 2022-04-16 NOTE — Therapy (Signed)
[place] ?[facility] ?[address] ?[place], [place], [address] ?Phone: [phone]   Fax:  [phone] ? ?Physical Therapy Treatment ? ?Patient Details  ?Name: Cory Espinoza ?MRN: [id number] ?Date of Birth: 04/23/1971 ?No data recorded ? ?Encounter Date: 04/16/2022 ? ? PT End of Session - 04/16/22 1508   ? ? Visit Number 84   ? Number of Visits 98   ? Date for PT Re-Evaluation 04/30/22   ? Authorization Type BCBS PPO;   ? Authorization Time Period 4/10 PN 4/20   ? PT Start Time 1515   ? PT Stop Time 1550   ? PT Time Calculation (min) 35 min   ? Equipment Utilized During Treatment Gait belt   ? Activity Tolerance Patient tolerated treatment well;Treatment limited secondary to medical complications (Comment)   provoked symptoms with certain activities  ? Behavior [facility] for tasks assessed/performed   ? ?  ?  ? ?  ? ? ?Past Medical History:  ?Diagnosis Date  ? Allergy   ? Anginal pain (HCC)   ? Anxiety   ? 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 Asthma   ? Atrial fibrillation (HCC)   ? Back pain   ? Severe Lumbar Pain  ? CHF (congestive heart failure) (HCC)   ? COPD (chronic obstructive pulmonary disease) (HCC)   ? Restrictive lung disease  ? Coronary artery disease   ? Dyspnea   ? Dysrhythmia   ? afib  ? GERD (gastroesophageal reflux disease)   ? Headache   ? [name], onset October 2020, cluster headaches in the past  ? History of kidney stones   ? Hyperlipidemia   ? Hypertension   ? Myocardial infarction [facility](HCC)   ? at age 51  ? Pneumonia   ? Restrictive lung disease   ? Sleep apnea   ? unable to use cpap since onset of migraines  ? ? ?Past Surgical History:  ?Procedure Laterality Date  ? ABDOMINAL EXPOSURE N/A 03/25/2020  ? Procedure: ABDOMINAL EXPOSURE;  Surgeon: Larina EarthlyEarly, Todd F, MD;  [facility];  Service: Vascular;  Laterality: N/A;  ? ANTERIOR CERVICAL DECOMP/DISCECTOMY FUSION N/A 01/23/2020  ? Procedure: ANTERIOR CERVICAL DECOMPRESSION FUSION - CERVICAL  SIX-CERVICAL SEVEN;  Surgeon: Julio SicksPool, Henry, MD;  [facility];  Service: Neurosurgery;  Laterality: N/A;  ? ANTERIOR LUMBAR FUSION N/A 03/25/2020  ? Procedure: ANTERIOR LUMBAR INTERBODY FUSION LUMBAR FIVE-SACRAL ONE.;  Surgeon: Julio SicksPool, Henry, MD;  Location: [facility] OR;  Service: Neurosurgery;  Laterality: N/A;  anterior  ? BACK SURGERY  1996  ? CARDIAC CATHETERIZATION Left 04/27/2016  ? Procedure: Left Heart Cath and Coronary Angiography;  Surgeon: Lamar BlinksBruce J Kowalski, MD;  Location: [facility] INVASIVE CV LAB;  Service: Cardiovascular;  Laterality: Left;  ? CARDIAC CATHETERIZATION N/A 04/27/2016  ? Procedure: Intravascular Pressure Wire/FFR Study;  Surgeon: Alwyn Peawayne D Callwood, MD;  Location: [facility] INVASIVE CV LAB;  Service: Cardiovascular;  Laterality: N/A;  ? CATHETERIZATION OF PULMONARY ARTERY WITH RETRIEVAL OF FOREIGN BODY Bilateral 04/07/2011  ? heart  ? DEGENERATIVE SPONDYLOLISTHESIS    ? KNEE ARTHROSCOPY Bilateral 2004  ? LUMBAR DISC SURGERY  1999  ? RADICULOPATHY, CERVICAL REGION    ? TONSILLECTOMY    ? ? ?There were no vitals filed for this visit. ? ? Subjective Assessment - 04/16/22 1552   ? ? Subjective Patient is severely nauseous. Reports he was bit by multiple ticks. Crawled under his house due to well being out and has been  feeling "off" ever since.   ? Patient is accompained by: Family member   ? Pertinent History Pt has a complex medical history. 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 He states that he fell 10-12 feet and struck his head, neck, and back. He was able to finish the course which took him approximately 45 minutes more to complete. He does endorse a second fall while finishing the course but did not suffer a second head injury. He states that he started getting headaches that day which have persisted since that time. 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 He does report improvement in his  headaches recently. Neurology was concerned about possible BPPV and have referred him for a vestibular evaluation. In addition since the injury, pt developed RUE and RLE pain and weakness. Pt states that NCV showed abnormal nerve conduction in RUE. He has since undergone a C7-7 ACDF on 01/23/20. He reports initial improvement in RUE strength, but states that he has a gradual return of his RUE weakness. He was also having significant RLE weakness and pain and underwent and L5-S1 anterior lumbar interbody fusion on 03/25/20. Patient reports that he does not have any back precautions but needs to wear the low back brace for one more month. Patient reports he has a follow-up appointment with the surgeon next month. Patient reports he has a follow-up appointment with Dr. Clelia Croft, neurologist, in August. He arrives to therapy ambulating with an upright rollator walker. Pt denies any mention of concussion after his injury. He has been having cognitive issues since his injury and has previously had a heavy metals screen as well as neurocognitive testing. He has had an MRI of his brain with and without contrast on 12/18/19. Results were mildly motion degraded examination. No evidence of acute intracranial abnormality. Minimal chronic small vessel ischemic disease. He has tremor in both his hands. He has a positive family history of Parkinson's disease in his grandfather. He is complaining of constant dizziness and unsteadiness which are worse with activity. Patient states that he frequently loses his balance and his wife has to steady him.   ? Limitations Lifting;Standing;Walking;House hold activities   ? Diagnostic tests He has had an MRI of his brain with and without contrast on 12/18/19. Results were mildly motion degraded examination. No evidence of acute intracranial abnormality. Minimal chronic small vessel ischemic disease.   ? Patient Stated Goals to be able to ambulate with a cane or no AD, to be able to drive, to be able  to ride his motorcycle   ? Currently in Pain? Yes   ? Pain Score 6    ? Pain Location Head   ? Pain Orientation Medial   ? Pain Descriptors / Indicators Aching   ? Pain Type Chronic pain   ? Pain Onset More than a month ago   ? Pain Onset Yesterday   ? ?  ?  ? ?  ? ? ? ?Patient is severely nauseous. Reports he was bit by multiple ticks. Crawled under his house due to well being out and has been feeling "off" ever since.  ? ?BP:137/89  ? ? ?Ice pack applied to back of neck, emesis bag provided.  ? ? ?in room with lights off and noise damped due to migraine.  ?  ?  ?TREATMENT ?  ?  ?3lb ankle weight: ?-LAQ 15x each  LE ?-march 15x each LE ?-hip abduction/adduction  15x each LE ?-heel raise 20x ? in room with lights off and noise damped due to migraine.  ?  ?  ?Ambulate >900 ft with PT from back room with lights off to car upstairs with cane in non injured hand and PT holding gait belt and injured hand's elbow for stabilization, frequent tripping over cane due to being on opposite side of patient's norm. One seated rest break due to severe dizziness ? Car transfer with min A for RLE  ? ? ?Pt educated throughout session about proper posture and technique with exercises. Improved exercise technique, movement at target joints, use of target muscles after min to mod verbal, visual, tactile cues. ? ? ?Patient session terminated early due to migraine increasing to 10/10. Patient's wife notified and given warning to watch for possible seizure. Patient's vitals monitored and are [facility]. Patient will benefit from therapy to reduce fall risk, improve strength, and improve capacity for functional mobility for improved quality of life ? ? ? ? ? ? ? ? ? ? ? ? ? ? ? ? ? ? PT Education - 04/16/22 1508   ? ? Education Details exercise technique, body mechanics   ? Person(s) Educated Patient   ? Methods Explanation;Demonstration;Tactile cues;Verbal cues   ? Comprehension Verbalized understanding;Returned demonstration;Verbal cues  required;Tactile cues required   ? ?  ?  ? ?  ? ? ? PT Short Term Goals - 03/26/22 1602   ? ?  ? PT SHORT TERM GOAL #1  ? Title Patient will be independent in home exercise program to improve strength/mobility

## 2022-04-17 ENCOUNTER — Ambulatory Visit: Payer: BC Managed Care – PPO | Admitting: Occupational Therapy

## 2022-04-20 ENCOUNTER — Ambulatory Visit: Payer: BC Managed Care – PPO

## 2022-04-20 ENCOUNTER — Ambulatory Visit: Payer: BC Managed Care – PPO | Admitting: Occupational Therapy

## 2022-04-20 DIAGNOSIS — M25632 Stiffness of left wrist, not elsewhere classified: Secondary | ICD-10-CM

## 2022-04-20 DIAGNOSIS — L905 Scar conditions and fibrosis of skin: Secondary | ICD-10-CM | POA: Diagnosis not present

## 2022-04-20 DIAGNOSIS — M6281 Muscle weakness (generalized): Secondary | ICD-10-CM

## 2022-04-20 DIAGNOSIS — M79642 Pain in left hand: Secondary | ICD-10-CM

## 2022-04-20 DIAGNOSIS — R208 Other disturbances of skin sensation: Secondary | ICD-10-CM

## 2022-04-20 DIAGNOSIS — M25642 Stiffness of left hand, not elsewhere classified: Secondary | ICD-10-CM

## 2022-04-20 DIAGNOSIS — R2681 Unsteadiness on feet: Secondary | ICD-10-CM

## 2022-04-20 DIAGNOSIS — R262 Difficulty in walking, not elsewhere classified: Secondary | ICD-10-CM

## 2022-04-20 NOTE — Therapy (Signed)
[place] ?[facility] ?[address] ?[place], [place], [address] ?Phone: [phone]   Fax:  [phone] ? ?Physical Therapy Treatment ? ?Patient Details  ?Name: Cory Espinoza ?MRN: [id number] ?Date of Birth: 08/08/1971 ?No data recorded ? ?Encounter Date: 04/20/2022 ? ? PT End of Session - 04/20/22 1521   ? ? Visit Number 85   ? Number of Visits 98   ? Date for PT Re-Evaluation 04/30/22   ? Authorization Type BCBS PPO;   ? Authorization Time Period 5/10 PN 4/20   ? PT Start Time 1515   ? PT Stop Time 1559   ? PT Time Calculation (min) 44 min   ? Equipment Utilized During Treatment Gait belt   ? Activity Tolerance Patient tolerated treatment well;Treatment limited secondary to medical complications (Comment)   provoked symptoms with certain activities  ? Behavior During Therapy [facility] for tasks assessed/performed   ? ?  ?  ? ?  ? ? ?Past Medical History:  ?Diagnosis Date  ? Allergy   ? Anginal pain (HCC)   ? Anxiety   ? 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 Asthma   ? Atrial fibrillation (HCC)   ? Back pain   ? Severe Lumbar Pain  ? CHF (congestive heart failure) (HCC)   ? COPD (chronic obstructive pulmonary disease) (HCC)   ? Restrictive lung disease  ? Coronary artery disease   ? Dyspnea   ? Dysrhythmia   ? afib  ? GERD (gastroesophageal reflux disease)   ? Headache   ? [name], onset October 2020, cluster headaches in the past  ? History of kidney stones   ? Hyperlipidemia   ? Hypertension   ? Myocardial infarction [facility](HCC)   ? at age 51  ? Pneumonia   ? Restrictive lung disease   ? Sleep apnea   ? unable to use cpap since onset of migraines  ? ? ?Past Surgical History:  ?Procedure Laterality Date  ? ABDOMINAL EXPOSURE N/A 03/25/2020  ? Procedure: ABDOMINAL EXPOSURE;  Surgeon: Larina EarthlyEarly, Todd F, MD;  [facility];  Service: Vascular;  Laterality: N/A;  ? ANTERIOR CERVICAL DECOMP/DISCECTOMY FUSION N/A 01/23/2020  ? Procedure: ANTERIOR CERVICAL DECOMPRESSION FUSION - CERVICAL  SIX-CERVICAL SEVEN;  Surgeon: Julio SicksPool, Henry, MD;  [facility];  Service: Neurosurgery;  Laterality: N/A;  ? ANTERIOR LUMBAR FUSION N/A 03/25/2020  ? Procedure: ANTERIOR LUMBAR INTERBODY FUSION LUMBAR FIVE-SACRAL ONE.;  Surgeon: Julio SicksPool, Henry, MD;  Location: [facility] OR;  Service: Neurosurgery;  Laterality: N/A;  anterior  ? BACK SURGERY  1996  ? CARDIAC CATHETERIZATION Left 04/27/2016  ? Procedure: Left Heart Cath and Coronary Angiography;  Surgeon: Lamar BlinksBruce J Kowalski, MD;  Location: [facility] INVASIVE CV LAB;  Service: Cardiovascular;  Laterality: Left;  ? CARDIAC CATHETERIZATION N/A 04/27/2016  ? Procedure: Intravascular Pressure Wire/FFR Study;  Surgeon: Alwyn Peawayne D Callwood, MD;  Location: [facility] INVASIVE CV LAB;  Service: Cardiovascular;  Laterality: N/A;  ? CATHETERIZATION OF PULMONARY ARTERY WITH RETRIEVAL OF FOREIGN BODY Bilateral 04/07/2011  ? heart  ? DEGENERATIVE SPONDYLOLISTHESIS    ? KNEE ARTHROSCOPY Bilateral 2004  ? LUMBAR DISC SURGERY  1999  ? RADICULOPATHY, CERVICAL REGION    ? TONSILLECTOMY    ? ? ?There were no vitals filed for this visit. ? ? Subjective Assessment - 04/20/22 1519   ? ? Subjective Patient had a seizure after last session. Starting in juen will decrease down to 1x/week.   ? Patient is accompained by: Family  member   ? Pertinent History Pt has a complex medical history. 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 He states that he fell 10-12 feet and struck his head, neck, and back. He was able to finish the course which took him approximately 45 minutes more to complete. He does endorse a second fall while finishing the course but did not suffer a second head injury. He states that he started getting headaches that day which have persisted since that time. 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 He does report improvement in his headaches recently. Neurology was concerned about possible BPPV  and have referred him for a vestibular evaluation. In addition since the injury, pt developed RUE and RLE pain and weakness. Pt states that NCV showed abnormal nerve conduction in RUE. He has since undergone a C7-7 ACDF on 01/23/20. He reports initial improvement in RUE strength, but states that he has a gradual return of his RUE weakness. He was also having significant RLE weakness and pain and underwent and L5-S1 anterior lumbar interbody fusion on 03/25/20. Patient reports that he does not have any back precautions but needs to wear the low back brace for one more month. Patient reports he has a follow-up appointment with the surgeon next month. Patient reports he has a follow-up appointment with Dr. Clelia Croft, neurologist, in August. He arrives to therapy ambulating with an upright rollator walker. Pt denies any mention of concussion after his injury. He has been having cognitive issues since his injury and has previously had a heavy metals screen as well as neurocognitive testing. He has had an MRI of his brain with and without contrast on 12/18/19. Results were mildly motion degraded examination. No evidence of acute intracranial abnormality. Minimal chronic small vessel ischemic disease. He has tremor in both his hands. He has a positive family history of Parkinson's disease in his grandfather. He is complaining of constant dizziness and unsteadiness which are worse with activity. Patient states that he frequently loses his balance and his wife has to steady him.   ? Limitations Lifting;Standing;Walking;House hold activities   ? Diagnostic tests He has had an MRI of his brain with and without contrast on 12/18/19. Results were mildly motion degraded examination. No evidence of acute intracranial abnormality. Minimal chronic small vessel ischemic disease.   ? Patient Stated Goals to be able to ambulate with a cane or no AD, to be able to drive, to be able to ride his motorcycle   ? Currently in Pain? Yes   ? Pain Score  4    ? Pain Location Head   ? Pain Orientation Right;Left   ? Pain Descriptors / Indicators Aching   ? Pain Type Chronic pain   ? Pain Onset More than a month ago   ? Pain Frequency Intermittent   ? Pain Onset Yesterday   ? ?  ?  ? ?  ? ? ? ? ? ?TREATMENT ?  ?  ?3lb ankle weight: ?-LAQ 15x each LE ?-march 15x each LE ?-hip abduction/adduction  15x each LE ?-heel raise 20x ?  ? green swiss ball TrA activation 10x 3 second holds ?Anterior delt raise to lateral side; x 10; very fatiguing  ? ?GTB: ?-adduction 15x each LE ?-abduction 15x  ?-hamstring curl 15x each LE  ?-RUE row 15x with PT guidance for sequencing and scap squeeze ?-RUE straight arm  row 15x ?-RUE IR 12x  ?-RUE ER 12x  ?  ?  ?Pt educated throughout session about proper posture and technique with exercises. Improved exercise technique, movement at target joints, use of target muscles after min to mod verbal, visual, tactile cues. ?  ?Patient presents with excellent motivation throughout physical therapy session. He does have increasing migraine throughout session requiring rest breaks and darkened room. Will decrease down to 1x/week starting next month. Patient will benefit from therapy to reduce fall risk, improve strength, and improve capacity for functional mobility for improved quality of life ? ? ? ? ? ? ? ? ? ? ? ? ? ? ? ? ? ? ? ? ? ? PT Education - 04/20/22 1520   ? ? Education Details exercise technique, body mechanics   ? Person(s) Educated Patient   ? Methods Explanation;Demonstration;Tactile cues;Verbal cues   ? Comprehension Verbalized understanding;Returned demonstration;Verbal cues required;Tactile cues required   ? ?  ?  ? ?  ? ? ? PT Short Term Goals - 03/26/22 1602   ? ?  ? PT SHORT TERM GOAL #1  ? Title Patient will be independent in home exercise program to improve strength/mobility for better functional independence with ADLs and for self-management.   ? Baseline 3/3 HEP compliant 3/31 hep compliant   ? Time 6   ? Period Weeks   ? Status  Achieved   ? Target Date 02/20/21   ?  ? PT SHORT TERM GOAL #2  ? Title Patient will be modified independent in walking on even/uneven surface with least restrictive assistive device, for 10+ minute

## 2022-04-20 NOTE — Therapy (Signed)
[place] ?[facility] ?[address] ?[place], [place], [address] ?Phone: [phone]   Fax:  [phone] ? ?Occupational Therapy Treatment ? ?Patient Details  ?Name: Cory Espinoza ?MRN: [id number] ?Date of Birth: 01-10-1971 ?Referring Provider (OT): Dr Cathie Hoops ? ? ?Encounter Date: 04/20/2022 ? ? OT End of Session - 04/20/22 1347   ? ? Visit Number 5   ? Number of Visits 16   ? Date for OT Re-Evaluation 05/26/22   ? OT Start Time 1347   ? OT Stop Time 1440   ? OT Time Calculation (min) 53 min   ? Activity Tolerance Patient tolerated treatment well   ? Behavior During Therapy [facility] for tasks assessed/performed   ? ?  ?  ? ?  ? ? ?Past Medical History:  ?Diagnosis Date  ? Allergy   ? Anginal pain (HCC)   ? Anxiety   ? 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 Asthma   ? Atrial fibrillation (HCC)   ? Back pain   ? Severe Lumbar Pain  ? CHF (congestive heart failure) (HCC)   ? COPD (chronic obstructive pulmonary disease) (HCC)   ? Restrictive lung disease  ? Coronary artery disease   ? Dyspnea   ? Dysrhythmia   ? afib  ? GERD (gastroesophageal reflux disease)   ? Headache   ? [name], onset October 2020, cluster headaches in the past  ? History of kidney stones   ? Hyperlipidemia   ? Hypertension   ? Myocardial infarction [facility])   ? at age 67  ? Pneumonia   ? Restrictive lung disease   ? Sleep apnea   ? unable to use cpap since onset of migraines  ? ? ?Past Surgical History:  ?Procedure Laterality Date  ? ABDOMINAL EXPOSURE N/A 03/25/2020  ? Procedure: ABDOMINAL EXPOSURE;  Surgeon: Larina Earthly, MD;  [facility] OR;  Service: Vascular;  Laterality: N/A;  ? ANTERIOR CERVICAL DECOMP/DISCECTOMY FUSION N/A 01/23/2020  ? Procedure: ANTERIOR CERVICAL DECOMPRESSION FUSION - CERVICAL SIX-CERVICAL SEVEN;  Surgeon: Julio Sicks, MD;  Location: [facility] OR;  Service: Neurosurgery;  Laterality: N/A;  ? ANTERIOR LUMBAR FUSION N/A 03/25/2020  ? Procedure: ANTERIOR LUMBAR INTERBODY FUSION LUMBAR  FIVE-SACRAL ONE.;  Surgeon: Julio Sicks, MD;  Location: [facility] OR;  Service: Neurosurgery;  Laterality: N/A;  anterior  ? BACK SURGERY  1996  ? CARDIAC CATHETERIZATION Left 04/27/2016  ? Procedure: Left Heart Cath and Coronary Angiography;  Surgeon: Lamar Blinks, MD;  Location: [facility] INVASIVE CV LAB;  Service: Cardiovascular;  Laterality: Left;  ? CARDIAC CATHETERIZATION N/A 04/27/2016  ? Procedure: Intravascular Pressure Wire/FFR Study;  Surgeon: Alwyn Pea, MD;  Location: [facility] INVASIVE CV LAB;  Service: Cardiovascular;  Laterality: N/A;  ? CATHETERIZATION OF PULMONARY ARTERY WITH RETRIEVAL OF FOREIGN BODY Bilateral 04/07/2011  ? heart  ? DEGENERATIVE SPONDYLOLISTHESIS    ? KNEE ARTHROSCOPY Bilateral 2004  ? LUMBAR DISC SURGERY  1999  ? RADICULOPATHY, CERVICAL REGION    ? TONSILLECTOMY    ? ? ?There were no vitals filed for this visit. ? ? Subjective Assessment - 04/20/22 1346   ? ? Subjective  Sorry I had to cancel a couple of sessions but I have bad migraines as well as a seizure last Thursday.  Had some tick bites to.  My wrists little better it only hurts now burning pain when I make a fist or my wrist bands.   ? 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    Previous History:  02/09/2022: Per  Cory BrookesNashalie Hibshman, MD on 02/07/2022: "Cory Espinoza is a 51 y.o. 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 The patient has since developed a pulsating mass in the left palm which intermittently bleeds with a pulsating pattern. He was recently [facility] in [facility] on 02/07/2022 where they had difficulty getting the bleeding under control. [facility] where the bleeding was under control and he was sent home. He is here for routine follow-up. He has previously been seen as an outpatient by Dr. Deeann SaintHoward Espinoza for this issue who ultimately had elected to proceed with nonoperative management. That visit was on 01/29/2022.     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 At his last visit, he was sent for an angiogram. He is here to discuss the results.    Today, the patient reports his aneurysm is bleeding. He stopped taking anticoagulant medication Plavix prescribed by his former cardiologist. He is very interested in moving forward with surgery.On 03/25/22 seen last time by surgeon - Left carpal tunnel release, left hand, distal vessel aneurysm excision and repair of the vessel by Dr. Doran Espinoza 3/29/2023When I saw the patient last on 03/18/2022, [name]   ? Patient Stated Goals My left hand is my strong hand and need to use my walking stick to prevent me to fall.  As well as to work in my shop doing woodwork I use mostly my left hand I am very dependent on my left hand   ? Currently in Pain? Yes   ?  Pain Score 4    ? Pain Location Wrist   ? Pain Orientation Left   ? Pain Descriptors / Indicators Burning   ? Pain Type Surgical pain   ? Pain Onset More than a month ago   ? Pain Frequency Intermittent   ? ?  ?  ? ?  ? ? ? ? ? [facility] OT Assessment - 04/20/22 0001   ? ?  ? AROM  ? Left Wrist Extension 70 Degrees   ? Left Wrist Flexion 90 Degrees   ?  ? Strength  ? Right Hand Grip (lbs) 79   ? Right Hand Lateral Pinch 18 lbs   ? Right Hand 3  Point Pinch 21 lbs   ? Left Hand Grip (lbs) 55   ? Left Hand Lateral Pinch 23 lbs   ? Left Hand 3 Point Pinch 14 lbs   ? ?  ?  ? ?  ? ? ? ?  Patient arrived this date after not being seen for 2-3 visits because of migraines and having a seizure last Thursday.  Patient with decreased nerve pain now only over volar wrist reported with a composite fist and wrist flexion.  Did notice patient unable to maintain wrist extension with composite fist. ?Since start of care could not really address scar tissue in the palm because of patient had a fall catching himself on his hand irritating the median nerve over the carpal tunnel.  And then had to cancel because of migraine and seizure activity. ?Could address this date scar tissue again and educated patient on focusing on composite wrist extension gentle passive range of motion as well as focusing on maintaining wrist extension or neutral during composite fist. ? ? ? ? ? ? ? ? ? ? ? ? ? ? ? ? OT Treatments/Exercises (OP) - 04/20/22 0001   ? ?  ? LUE Contrast Bath  ? Time 8 minutes   ? Comments Decreased pain and increased range of motion prior to range of motion and soft tissue   ? ?  ?  ? ?  ? ? ?Contrast done with patient on left hand prior to scar massage and active and active assisted range of motion to digits.  Pain and stiffness decreases and patient show increase mobility.  ?  ? ?Scar massage done by OT manually as well as using mini massager.  Did use extractor over 1 area on ulnar side of scar with active range of motion and  extension of digits tolerating well ?Metacarpal spreads by OT as well as carpal tunnel spreads and educated wife and patient on doing at home to decrease scar tissue. ?Using of [employer] -Care scar pad at nighttime un

## 2022-04-21 ENCOUNTER — Encounter: Payer: BC Managed Care – PPO | Admitting: Occupational Therapy

## 2022-04-23 ENCOUNTER — Ambulatory Visit: Payer: BC Managed Care – PPO | Admitting: Occupational Therapy

## 2022-04-23 ENCOUNTER — Ambulatory Visit: Payer: BC Managed Care – PPO

## 2022-04-23 ENCOUNTER — Encounter: Payer: BC Managed Care – PPO | Admitting: Occupational Therapy

## 2022-04-23 DIAGNOSIS — M25632 Stiffness of left wrist, not elsewhere classified: Secondary | ICD-10-CM

## 2022-04-23 DIAGNOSIS — M25642 Stiffness of left hand, not elsewhere classified: Secondary | ICD-10-CM

## 2022-04-23 DIAGNOSIS — M6281 Muscle weakness (generalized): Secondary | ICD-10-CM

## 2022-04-23 DIAGNOSIS — R208 Other disturbances of skin sensation: Secondary | ICD-10-CM

## 2022-04-23 DIAGNOSIS — L905 Scar conditions and fibrosis of skin: Secondary | ICD-10-CM | POA: Diagnosis not present

## 2022-04-23 DIAGNOSIS — M79642 Pain in left hand: Secondary | ICD-10-CM

## 2022-04-23 NOTE — Therapy (Signed)
[facility] [address] [place], [place], [address] Phone: [phone]   Fax:  [phone]  Occupational Therapy Treatment  Patient Details  Name: MARICO BUCKLE MRN: [id number] Date of Birth: 05/14/1971 Referring Provider (OT): Dr Cathie Hoops   Encounter Date: 04/23/2022   OT End of Session - 04/23/22 1647     Visit Number 6    Number of Visits 16    Date for OT Re-Evaluation 05/26/22    OT Start Time 1315    OT Stop Time 1358    OT Time Calculation (min) 43 min    Activity Tolerance Patient tolerated treatment well    Behavior During Therapy [facility] for tasks assessed/performed             Past Medical History:  Diagnosis Date   Allergy    Anginal pain (HCC)    Anxiety    Arthritis    lumbar spine   Asthma    Atrial fibrillation (HCC)    Back pain    Severe Lumbar Pain   CHF (congestive heart failure) (HCC)    COPD (chronic obstructive pulmonary disease) (HCC)    Restrictive lung disease   Coronary artery disease    Dyspnea    Dysrhythmia    afib   GERD (gastroesophageal reflux disease)    Headache    [name], onset October 2020, cluster headaches in the past   History of kidney stones    Hyperlipidemia    Hypertension    Myocardial infarction (HCC)    at age 51   Pneumonia    Restrictive lung disease    Sleep apnea    unable to use cpap since onset of migraines    Past Surgical History:  Procedure Laterality Date   ABDOMINAL EXPOSURE N/A 03/25/2020   Procedure: ABDOMINAL EXPOSURE;  Surgeon: Larina Earthly, MD;  Location: [facility] OR;  Service: Vascular;  Laterality: N/A;   ANTERIOR CERVICAL DECOMP/DISCECTOMY FUSION N/A 01/23/2020   Procedure: ANTERIOR CERVICAL DECOMPRESSION FUSION - CERVICAL SIX-CERVICAL SEVEN;  Surgeon: Julio Sicks, MD;  Location: [facility] OR;  Service: Neurosurgery;  Laterality: N/A;   ANTERIOR LUMBAR FUSION N/A 03/25/2020   Procedure: ANTERIOR LUMBAR INTERBODY FUSION LUMBAR  FIVE-SACRAL ONE.;  Surgeon: Julio Sicks, MD;  Location: [facility] OR;  Service: Neurosurgery;  Laterality: N/A;  anterior   BACK SURGERY  1996   CARDIAC CATHETERIZATION Left 04/27/2016   Procedure: Left Heart Cath and Coronary Angiography;  Surgeon: Lamar Blinks, MD;  Location: [facility] INVASIVE CV LAB;  Service: Cardiovascular;  Laterality: Left;   CARDIAC CATHETERIZATION N/A 04/27/2016   Procedure: Intravascular Pressure Wire/FFR Study;  Surgeon: Alwyn Pea, MD;  Location: [facility] INVASIVE CV LAB;  Service: Cardiovascular;  Laterality: N/A;   CATHETERIZATION OF PULMONARY ARTERY WITH RETRIEVAL OF FOREIGN BODY Bilateral 04/07/2011   heart   DEGENERATIVE SPONDYLOLISTHESIS     KNEE ARTHROSCOPY Bilateral 2004   LUMBAR DISC SURGERY  1999   RADICULOPATHY, CERVICAL REGION     TONSILLECTOMY      There were no vitals filed for this visit.   Subjective Assessment - 04/23/22 1646     Subjective  Please pain over my wrist when bearing weight compared to last time.  But a little more tenderness in the palm.  Not a good day today headache and migraine that.  Not feeling as strong and steady    Pertinent History Note from Dr Virgel Paling- Left hand aneurysm.    Previous History:  02/09/2022: Per  [name], MD on 02/07/2022: "Mr. JAHAAN VANWAGNER is a 51 y.o. 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 The patient has since developed a pulsating mass in the left palm which intermittently bleeds with a pulsating pattern. He was recently seen in the [facility] Emergency Room on 02/07/2022 where they had difficulty getting the bleeding under control. He was transferred to the [facility] where the bleeding was under control and he was sent home. He is here for routine follow-up. He has previously been seen as an outpatient by Dr. Deeann Saint for this issue who ultimately had elected to proceed with nonoperative management. That visit was on 01/29/2022.     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 At his last visit, he was sent for an angiogram. He is here to discuss the results.    Today, the patient reports his aneurysm is bleeding. He stopped taking anticoagulant medication Plavix prescribed by his former cardiologist. He is very interested in moving forward with surgery.On 03/25/22 seen last time by surgeon - Left carpal tunnel release, left hand, distal vessel aneurysm excision and repair of the vessel by Dr. Doran Durand 3/29/2023When I saw the patient last on 03/18/2022, [name]    Patient Stated Goals My left hand is my strong hand and need to use my walking stick to prevent me to fall.  As well as to work in my shop doing woodwork I use mostly my left hand I am very dependent on my left hand    Currently in Pain? No/denies                 [facility] OT Assessment - 04/23/22 0001       Strength   Left Hand 3 Point Pinch 17 lbs                      OT Treatments/Exercises (OP) - 04/23/22 0001       LUE Paraffin   Number Minutes Paraffin 8 Minutes    LUE Paraffin Location Hand    Comments Prior to scar massage              Patient returned this week after not being seen for 2 3 visits because of migraines.  Patient earlier this week had still continued increased  pain at the volar wrist during composite tight fist and wrist flexion.  This date patient with less pain with wrist flexion but still pain over volar wrist with a tight fist.  Continue to be mostly limited by thick scar tissue in the palm.                                  Was able to initiate some paraffin to palm and hand used To pour over for one of the's nails that had little open area where he bites his nails kept out of the paraffin.  Pain and stiffness decreases and patient show increase mobility.      Scar massage done by OT manually as well as using mini massager.  Did use extractor over 1 area on ulnar side of scar with active range of motion and extension of digits tolerating well Metacarpal spreads by OT as well as carpal tunnel spreads and educated wife and patient on doing at home to decrease scar tissue. Using of Cica -Care scar pad at nighttime under Isotoner glove for swelling Patient light rolling over the red folder for gentle massage and facilitation of digit extension prior to tendon glides  Composite extension of wrist digits PROM during session using the right hand 8 reps slight pull.   Followed by composite fist active assisted range to palm a 10 reps each. Opposition picking up 1 cm foam block alternating digits 5 reps Patient still limited for opposition to fifth secondary to scar tissue. Palmar radial abduction of thumb 10 reps Wrist extension and flexion over edge of table no pressure on palm AAROM 10  reps          OT Education - 04/23/22 1647     Education Details Home program changes as well as nerve healing    Person(s) Educated Patient;Parent(s)    Methods Explanation;Demonstration;Tactile cues;Verbal cues;Handout    Comprehension Verbal cues required;Returned demonstration;Verbalized understanding              OT Short Term Goals - 03/31/22 1808       OT SHORT TERM GOAL #1   Title Patient to be independent in home program to decrease edema, decreased pain as well as scar tissue to increase digit flexion to touching palm.    Baseline Patient has no knowledge of home program to decrease scar tissue, decrease pain and edema pain t2-10/10 with active range of motion, increase edema as well as scar tissue in palm with 1 inch x 1 inch area that still healing with scab.  Patient hypersensitive to massage and rougher textures.  MC flexion is 55-70 degrees, PIPs 60 degrees - 90 degrees.    Time 3    Period Weeks    Status New    Target Date 04/21/22               OT Long Term Goals - 03/31/22 1810       OT LONG TERM GOAL #1   Title Patient's left hand scar tissue improve for patient to show increase active range of motion as well as tolerating different textures, tapping and gripping tools of different textures in ADLs and IADLs    Baseline Scar is Z scar thick, adhere, 1 inch x 1 inch area of the still healing with scab-patient hypersensitive can tolerate light massage and touch but not scar tissue massage and rougher textures.    Time 5  Period Weeks    Status New    Target Date 05/05/22      OT LONG TERM GOAL #2   Title Patient left hand digits flexion improve touching palm to be able to use walking stick while maintaining full extension with pain less than 2/10    Baseline MC flexion in digits 55-70 degrees, PIPs 60 degrees - 90 degrees, unable to use walking stick or gripping tools because of scar sensitivity and tightness as well as stiffness in hand.     Time 6    Period Weeks    Status New    Target Date 05/12/22      OT LONG TERM GOAL #3   Title Left wrist active range of motion improved to within normal limits for patient to push up from chair as well as use hand in light would work    Baseline Wrist extension 30 degrees with pain, flexion 62 degrees on left hand.  Limited by scar tissue.  Not able to use hand    Time 8    Period Weeks    Status New    Target Date 05/26/22      OT LONG TERM GOAL #4   Title Patient's left hand grip strength and prehension increased to within functional limits for his age to be able to use hand and use of walking stick, holding a plate and starting to use and woodwork.    Baseline Patient is tomorrow 4 weeks postop, scar thick and adherent and tender to massage and rougher textures, grip strength not tested yet unable to touch palm, increase stiffness in MCPs and PIPs flexion.    Time 8    Period Weeks    Status New    Target Date 05/26/22                   Plan - 04/23/22 1647     Clinical Impression Statement Patient present at OT evaluation with a diagnosis of postop left carpal tunnel release with repair of arterial vessel and excision of aneurysm.  Surgery date was 03/04/2022.  Patient is now about 5  weeks postop.  Patient presents with thick Z scar in left palm from volar wrist to distal palmar crease.  Patient accident was on 12/26/21.  Patient was last seen by surgeon on 03/25/2022 when sutures was removed.  Patient still tender over the area which Is healing nicely.  Patient made great progress  first 1-2 visits after evaluation but then he had a fall that he caught himself on his hand.  The last 2- 3 sessions was limited by nerve pain over volar wrist and carpal tunnel.  Patient canceled 1 or 2 appointments because of increased migraines as well as a seizure.  Patient had some pain over volar wrist with wrist flexion with a fist earlier this week but today mostly discomfort with a tight  fist and pain in the volar wrist.  Less pain with wrist flexion.  Patient educated when making tight fist or composite fist to make sure he does not collapse into wrist flexion.  Patient has difficulty maintaining wrist extension during a composite fist because of scar tissue that could not have been addressed the last few sessions because of nerve pain after a fall.  Patient to continue with prefab wrist immobilization splin when using hand with activities that irritate nerve.  Patient continues to be limited in scar tissue and composite flexion .  Patient decrease in opposition to 5th and  fine motor coordination but improving..  As well as decreased strength because of scar tissue, stiffness and pain limiting his functional use of left hand in ADLs and IADLs.  Patient can benefit from skilled OT services    OT Occupational Profile and History Problem Focused Assessment - Including review of records relating to presenting problem    Occupational performance deficits (Please refer to evaluation for details): ADL's;IADL's;Play;Leisure;Social Participation    Body Structure / Function / Physical Skills ADL;Strength;Pain;Dexterity;Edema;UE functional use;ROM;IADL;Scar mobility;Flexibility;Sensation;Skin integrity;Decreased knowledge of precautions    Rehab Potential Good    Clinical Decision Making Several treatment options, min-mod task modification necessary    Comorbidities Affecting Occupational Performance: None    Modification or Assistance to Complete Evaluation  No modification of tasks or assist necessary to complete eval    OT Frequency 2x / week    OT Duration 8 weeks    OT Treatment/Interventions Self-care/ADL training;Moist Heat;Fluidtherapy;DME and/or AE instruction;Contrast Bath;Therapeutic activities;Scar mobilization;Therapeutic exercise;Paraffin;Manual Therapy;Patient/family education;Passive range of motion    Consulted and Agree with Plan of Care Patient;Family member/caregiver              Patient will benefit from skilled therapeutic intervention in order to improve the following deficits and impairments:   Body Structure / Function / Physical Skills: ADL, Strength, Pain, Dexterity, Edema, UE functional use, ROM, IADL, Scar mobility, Flexibility, Sensation, Skin integrity, Decreased knowledge of precautions       Visit Diagnosis: Muscle weakness (generalized)  Scar tissue  Other disturbances of skin sensation  Stiffness of left hand, not elsewhere classified  Pain in left hand  Stiffness of left wrist, not elsewhere classified    Problem List Patient Active Problem List   Diagnosis Date Noted   [name] (coronary artery disease) 10/21/2021   Degenerative spondylolisthesis 03/25/2020   Physical deconditioning 03/04/2020   Lobar pneumonia, unspecified organism (HCC) 03/04/2020   Intractable hemiplegic migraine with status migrainosus 03/02/2020   Current tobacco use 02/05/2020   Abnormal findings on diagnostic imaging of lung 02/05/2020   Shortness of breath 02/05/2020   Herniated disc, cervical 01/23/2020   Cervical spondylosis with myelopathy and radiculopathy 01/23/2020   Pre-operative respiratory examination 01/12/2020   Weakness on right side of face 12/22/2019   Osteoarthritis of spine with radiculopathy, lumbar region 12/11/2019   Intractable migraine with aura with status migrainosus 11/09/2019   Former smoker 08/27/2018   Benign essential HTN 08/27/2018   Cough productive of purulent sputum 11/08/2017   GERD (gastroesophageal reflux disease) 05/06/2017   Contusion of hand 04/22/2017   BP (high blood pressure) 04/22/2017   Oxygen desaturation 04/22/2017   Prostate lump 04/22/2017   Unstable angina (HCC) 04/27/2016   Chest pain 04/24/2016   Morbid obesity (HCC) 01/29/2016   Elevated ALT measurement 10/30/2015   Nonspecific elevation of levels of transaminase and lactic acid dehydrogenase (ldh) 10/30/2015   Reduced libido 10/29/2015    Hyperlipidemia 08/06/2015   Anxiety 08/06/2015   Atherosclerosis of coronary artery 08/06/2015   Childhood asthma 08/06/2015   Chronic obstructive pulmonary disease (HCC) 08/06/2015   OSA (obstructive sleep apnea) 08/06/2015   Anxiety disorder 08/06/2015   Atherosclerosis of native coronary artery of native heart with stable angina pectoris (HCC) 08/06/2015   Uncomplicated asthma 08/06/2015    Oletta Cohn, OTR/L,CLT 04/23/2022, 5:35 PM  [facility] [address] [place], [address] Phone: [phone]   Fax:  [phone]  Name: TRASK VOSLER MRN: [id number] Date of Birth: Jan 24, 1971

## 2022-04-27 ENCOUNTER — Ambulatory Visit: Payer: BC Managed Care – PPO

## 2022-04-27 ENCOUNTER — Ambulatory Visit: Payer: BC Managed Care – PPO | Admitting: Occupational Therapy

## 2022-04-30 ENCOUNTER — Ambulatory Visit: Payer: BC Managed Care – PPO

## 2022-04-30 ENCOUNTER — Ambulatory Visit: Payer: BC Managed Care – PPO | Admitting: Occupational Therapy

## 2022-04-30 DIAGNOSIS — R208 Other disturbances of skin sensation: Secondary | ICD-10-CM

## 2022-04-30 DIAGNOSIS — M79642 Pain in left hand: Secondary | ICD-10-CM

## 2022-04-30 DIAGNOSIS — M25642 Stiffness of left hand, not elsewhere classified: Secondary | ICD-10-CM

## 2022-04-30 DIAGNOSIS — M6281 Muscle weakness (generalized): Secondary | ICD-10-CM

## 2022-04-30 DIAGNOSIS — L905 Scar conditions and fibrosis of skin: Secondary | ICD-10-CM

## 2022-04-30 DIAGNOSIS — M25632 Stiffness of left wrist, not elsewhere classified: Secondary | ICD-10-CM

## 2022-04-30 NOTE — Therapy (Signed)
[facility] [address], [place], [address] Phone: [phone]   Fax:  [phone]  Occupational Therapy Treatment  Patient Details  Name: Cory Espinoza MRN: [id number] Date of Birth: [birth year] Referring Provider (OT): Dr Tasia Catchings   Encounter Date: 04/30/2022   OT End of Session - 04/30/22 1806     Visit Number 7    Number of Visits 16    Date for OT Re-Evaluation 05/26/22    OT Start Time [id number]    OT Stop Time 1624    OT Time Calculation (min) 54 min    Activity Tolerance Patient tolerated treatment well    Behavior During Therapy [facility] for tasks assessed/performed             Past Medical History:  Diagnosis Date   Allergy    Anginal pain (HCC)    Anxiety    Arthritis    lumbar spine   Asthma    Atrial fibrillation (HCC)    Back pain    Severe Lumbar Pain   CHF (congestive heart failure) (HCC)    COPD (chronic obstructive pulmonary disease) (HCC)    Restrictive lung disease   Coronary artery disease    Dyspnea    Dysrhythmia    afib   GERD (gastroesophageal reflux disease)    Headache    [name], onset October 2020, cluster headaches in the past   History of kidney stones    Hyperlipidemia    Hypertension    Myocardial infarction ([place])    at age 24   Pneumonia    Restrictive lung disease    Sleep apnea    unable to use cpap since onset of migraines    Past Surgical History:  Procedure Laterality Date   ABDOMINAL EXPOSURE N/A 03/25/2020   Procedure: ABDOMINAL EXPOSURE;  Surgeon: Rosetta Posner, MD;  Location: [facility] OR;  Service: Vascular;  Laterality: N/A;   ANTERIOR CERVICAL DECOMP/DISCECTOMY FUSION N/A 01/23/2020   Procedure: ANTERIOR CERVICAL DECOMPRESSION FUSION - CERVICAL SIX-CERVICAL SEVEN;  Surgeon: Earnie Larsson, MD;  Location: [place];  Service: Neurosurgery;  Laterality: N/A;   ANTERIOR LUMBAR FUSION N/A 03/25/2020   Procedure: ANTERIOR LUMBAR INTERBODY FUSION LUMBAR  FIVE-SACRAL ONE.;  Surgeon: Earnie Larsson, MD;  Location: [place];  Service: Neurosurgery;  Laterality: N/A;  anterior   BACK SURGERY  1996   CARDIAC CATHETERIZATION Left 04/27/2016   Procedure: Left Heart Cath and Coronary Angiography;  Surgeon: Corey Skains, MD;  Location: [place] [facility];  Service: Cardiovascular;  Laterality: Left;   CARDIAC CATHETERIZATION N/A 04/27/2016   Procedure: Intravascular Pressure Wire/FFR Study;  Surgeon: Yolonda Kida, MD;  Location: [place] [facility];  Service: Cardiovascular;  Laterality: N/A;   CATHETERIZATION OF PULMONARY ARTERY WITH RETRIEVAL OF FOREIGN BODY Bilateral 04/07/2011   heart   DEGENERATIVE SPONDYLOLISTHESIS     KNEE ARTHROSCOPY Bilateral 2004   LUMBAR DISC SURGERY  1999   RADICULOPATHY, CERVICAL REGION     TONSILLECTOMY      There were no vitals filed for this visit.   Subjective Assessment - 04/30/22 1805     Subjective  You would not like it I work in the shop on Monday the whole day and then I lost my balance fall and hit my head and the my back where had surgery.  I was out for it for about 2 days that is why canceled.  Left hand is doing okay pain is  better tenderness is better.  Right hand is still my week 1 that I have trouble with using my workshop doing woodwork.    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    Previous History:  02/09/2022: Per  Varney Baas, MD on 02/07/2022: "Cory Espinoza is a 51 y.o. 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 Presented to OSH for evaluation of continued bleeding from the site. Bleeding has been intermittent since injury. Was recently seen in outside orthopaedic clinic 2/15 and decision was to manage nonoperatively. States that today started  spontaneously "spurting blood." No recent injuries or trauma. He is on aspirin and Plavix.  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 The patient has since developed a pulsating mass in the left palm which intermittently bleeds with a pulsating pattern. He was recently seen in the [facility] Emergency Room on 02/07/2022 where they had difficulty getting the bleeding under control. He was transferred to the [facility] where the bleeding was under control and he was sent home. He is here for routine follow-up. He has previously been seen as an outpatient by Dr. Earnestine Leys for this issue who ultimately had elected to proceed with nonoperative management. That visit was on 01/29/2022.     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 At his last visit, he was sent for an angiogram. He is here to discuss the results.    Today, the patient reports his aneurysm is bleeding. He stopped taking anticoagulant medication Plavix prescribed by his former cardiologist. He is very interested in moving forward with surgery.On 03/25/22 seen last time by surgeon - Left carpal tunnel release, left hand, distal vessel aneurysm excision and repair of the vessel by Dr. Vanessa Barbara 3/29/2023When I saw the patient last on 03/18/2022, [name]    Patient Stated Goals My left hand is my strong hand and need to use my walking stick to prevent me to fall.  As  well as to work in my shop doing woodwork I use mostly my left hand I am very dependent on my left hand    Currently in Pain? No/denies                [facility] OT Assessment - 04/30/22 0001       Strength   Right Hand Grip (lbs) 98    Right Hand Lateral Pinch 33 lbs    Right Hand 3 Point Pinch 27 lbs    Left Hand Grip (lbs) 60    Left Hand Lateral Pinch 26 lbs              Patient returns today after not being seen  earlier this week because of a fall in workshop.  Upon further assessment of his activities and workshop and injuries related-is mostly working on doing it too long peers of time and fatiguing and losing concentration making it worse with his TBI and extended medical history.  Patient patient report most accidents or issues happens with his decreased strength in right upper extremity using his electric or motorized tools injuring himself. Discussed extensively with patient and mother-in-law modifications to workshop, schedule as well as set up. Patient do report in the past working with PT on some strengthening for right upper extremity mostly at appears scapular retraction and external rotation together with elbow flexion.  Patient decrease in shoulder abduction as well as horizontal abduction.  Decreased strength in tricep, forearm and wrist in all planes 4 -/5  Grip and prehension strength did increase bilaterally see flowsheet        Patient do says show improvement in scar tissue as well as pain and tenderness in left hand.  Less pain with composite fist as well as adding wrist flexion.     OT Treatments/Exercises (OP) - 04/30/22 0001       LUE Paraffin   Number Minutes Paraffin 8 Minutes    LUE Paraffin Location Hand    Comments Prior to scar massage                                            Was able to initiate some paraffin to palm and hand used To pour over for one of the's nails that had little open area where he bites his nails  kept out of the paraffin.  Pain and stiffness decreases and patient show increase mobility.      Scar massage done by OT manually as well as using mini massager.  Did use extractor over 1 area on ulnar side of scar with active range of motion and extension of digits tolerating well Metacarpal spreads by OT as well as carpal tunnel spreads and educated wife and patient on doing at home to decrease scar tissue. Using of Cica -Care scar pad at nighttime under Isotoner glove for swelling Composite extension of wrist digits PROM during session using the right hand 8 reps slight pull.   Followed by composite fist active assisted range to palm a 10 reps each. Opposition picking up 1 cm foam block alternating digits 5 reps Patient still limited for opposition to fifth secondary to scar tissue. Palmar radial abduction of thumb 10 reps Wrist extension and flexion over edge of table no pressure on palm AAROM 10 reps                      OT Education - 04/30/22 1806     Education Details Home program changes as well as nerve healing    Person(s) Educated Patient;Parent(s)    Methods Explanation;Demonstration;Tactile cues;Verbal cues;Handout    Comprehension Verbal cues required;Returned demonstration;Verbalized understanding              OT Short Term Goals - 03/31/22 1808       OT SHORT TERM GOAL #1   Title Patient to be independent in home program to decrease edema, decreased pain as well as scar tissue to increase digit flexion to touching palm.    Baseline Patient has no knowledge of home program to decrease scar tissue, decrease pain  and edema pain t2-10/10 with active range of motion, increase edema as well as scar tissue in palm with 1 inch x 1 inch area that still healing with scab.  Patient hypersensitive to massage and rougher textures.  MC flexion is 55-70 degrees, PIPs 60 degrees - 90 degrees.    Time 3    Period Weeks    Status New    Target Date 04/21/22                OT Long Term Goals - 03/31/22 1810       OT LONG TERM GOAL #1   Title Patient's left hand scar tissue improve for patient to show increase active range of motion as well as tolerating different textures, tapping and gripping tools of different textures in ADLs and IADLs    Baseline Scar is Z scar thick, adhere, 1 inch x 1 inch area of the still healing with scab-patient hypersensitive can tolerate light massage and touch but not scar tissue massage and rougher textures.    Time 5    Period Weeks    Status New    Target Date 05/05/22      OT LONG TERM GOAL #2   Title Patient left hand digits flexion improve touching palm to be able to use walking stick while maintaining full extension with pain less than 2/10    Baseline MC flexion in digits 55-70 degrees, PIPs 60 degrees - 90 degrees, unable to use walking stick or gripping tools because of scar sensitivity and tightness as well as stiffness in hand.    Time 6    Period Weeks    Status New    Target Date 05/12/22      OT LONG TERM GOAL #3   Title Left wrist active range of motion improved to within normal limits for patient to push up from chair as well as use hand in light would work    Baseline Wrist extension 30 degrees with pain, flexion 62 degrees on left hand.  Limited by scar tissue.  Not able to use hand    Time 8    Period Weeks    Status New    Target Date 05/26/22      OT LONG TERM GOAL #4   Title Patient's left hand grip strength and prehension increased to within functional limits for his age to be able to use hand and use of walking stick, holding a plate and starting to use and woodwork.    Baseline Patient is tomorrow 4 weeks postop, scar thick and adherent and tender to massage and rougher textures, grip strength not tested yet unable to touch palm, increase stiffness in MCPs and PIPs flexion.    Time 8    Period Weeks    Status New    Target Date 05/26/22                   Plan - 04/30/22  1807     Clinical Impression Statement Patient present at OT evaluation with a diagnosis of postop left carpal tunnel release with repair of arterial vessel and excision of aneurysm.  Surgery date was 03/04/2022.  Patient is now about 8  weeks postop.  Patient presents with thick Z scar in left palm from volar wrist to distal palmar crease.  Patient accident was on 12/26/21.  Patient was last seen by surgeon on 03/25/2022 when sutures was removed.  Patient making great progress with pain and tenderness in palm over  scar as well as pain with composite fisting with wrist flexion.  Scar tissue mostly over area of 3 x 1 cm over delayed scar as well as composite fist with wrist flexion.  Patient still having little bit of a hard time maintaining neutral with wrist flexion and composite fist.  Patient canceled earlier this week because of folding in his workshop trying to do some woodwork.  Discussed with patient some work analysis in his workshop.  Patient fatiguing and losing concentration because of his brain injury, migraines with increased weakness in the right upper extremity as well as losing focus.  Discussed with patient taking breaks, organization and workshop as well as barstool 2 chairs to sit again for rest breaks.  Patient reports weakness in right upper extremity from cervical and brain injury patient with decreased strength in upper extremity with shoulder horizontal abduction, abduction, protraction elbow extension forearm and wrist strength in all planes.  Since last time taken grip strength improved bilaterally as well as three-point pinch.  Patient to report he has a good day with his migraines and that affects also grip strength and prehension.  Patient continues to be limited in scar tissue and composite flexion with maintaining wrist neutral..  Patient decrease in opposition to 5th and fine motor coordination but improving..  As well as decreased strength because of scar tissue, stiffness and pain  limiting his functional use of left hand in ADLs and IADLs.  Patient can benefit from skilled OT services. Add this date to plan R UE strenghning to increase his independeince in IADLS and prvent injuries.    OT Occupational Profile and History Problem Focused Assessment - Including review of records relating to presenting problem    Occupational performance deficits (Please refer to evaluation for details): ADL's;IADL's;Play;Leisure;Social Participation    Body Structure / Function / Physical Skills ADL;Strength;Pain;Dexterity;Edema;UE functional use;ROM;IADL;Scar mobility;Flexibility;Sensation;Skin integrity;Decreased knowledge of precautions    Rehab Potential Good    Clinical Decision Making Several treatment options, min-mod task modification necessary    Comorbidities Affecting Occupational Performance: None    Modification or Assistance to Complete Evaluation  No modification of tasks or assist necessary to complete eval    OT Frequency 2x / week    OT Duration 8 weeks    OT Treatment/Interventions Self-care/ADL training;Moist Heat;Fluidtherapy;DME and/or AE instruction;Contrast Bath;Therapeutic activities;Scar mobilization;Therapeutic exercise;Paraffin;Manual Therapy;Patient/family education;Passive range of motion    Consulted and Agree with Plan of Care Patient;Family member/caregiver             Patient will benefit from skilled therapeutic intervention in order to improve the following deficits and impairments:   Body Structure / Function / Physical Skills: ADL, Strength, Pain, Dexterity, Edema, UE functional use, ROM, IADL, Scar mobility, Flexibility, Sensation, Skin integrity, Decreased knowledge of precautions       Visit Diagnosis: Muscle weakness (generalized)  Scar tissue  Other disturbances of skin sensation  Stiffness of left hand, not elsewhere classified  Pain in left hand  Stiffness of left wrist, not elsewhere classified    Problem List Patient Active  Problem List   Diagnosis Date Noted   [name] (coronary artery disease) 10/21/2021   Degenerative spondylolisthesis 03/25/2020   Physical deconditioning 03/04/2020   Lobar pneumonia, unspecified organism ([place]) 03/04/2020   Intractable hemiplegic migraine with status migrainosus 03/02/2020   Current tobacco use 02/05/2020   Abnormal findings on diagnostic imaging of lung 02/05/2020   Shortness of breath 02/05/2020   Herniated disc, cervical 01/23/2020   Cervical spondylosis with myelopathy and  radiculopathy 01/23/2020   Pre-operative respiratory examination 01/12/2020   Weakness on right side of face 12/22/2019   Osteoarthritis of spine with radiculopathy, lumbar region 12/11/2019   Intractable migraine with aura with status migrainosus 11/09/2019   Former smoker 08/27/2018   Benign essential HTN 08/27/2018   Cough productive of purulent sputum 11/08/2017   GERD (gastroesophageal reflux disease) 05/06/2017   Contusion of hand 04/22/2017   BP (high blood pressure) 04/22/2017   Oxygen desaturation 04/22/2017   Prostate lump 04/22/2017   Unstable angina ([place]) 04/27/2016   Chest pain 04/24/2016   Morbid obesity ([place]) 01/29/2016   Elevated ALT measurement 10/30/2015   Nonspecific elevation of levels of transaminase and lactic acid dehydrogenase (ldh) 10/30/2015   Reduced libido 10/29/2015   Hyperlipidemia 08/06/2015   Anxiety 08/06/2015   Atherosclerosis of coronary artery 08/06/2015   Childhood asthma 08/06/2015   Chronic obstructive pulmonary disease ([place]) 08/06/2015   OSA (obstructive sleep apnea) 08/06/2015   Anxiety disorder 08/06/2015   Atherosclerosis of native coronary artery of native heart with stable angina pectoris ([place]) 123XX123   Uncomplicated asthma 123XX123    Rosalyn Gess, OTR/L,CLT 04/30/2022, 6:16 PM  [facility] [address], [place], [address] Phone: [phone]   Fax:   [phone]  Name: Cory Espinoza MRN: [id number] Date of Birth: Apr 06, 1971

## 2022-05-05 ENCOUNTER — Ambulatory Visit: Payer: BC Managed Care – PPO | Admitting: Occupational Therapy

## 2022-05-07 ENCOUNTER — Ambulatory Visit: Payer: BC Managed Care – PPO

## 2022-05-12 ENCOUNTER — Ambulatory Visit: Payer: BC Managed Care – PPO | Admitting: Occupational Therapy

## 2022-05-12 ENCOUNTER — Ambulatory Visit: Payer: BC Managed Care – PPO | Attending: Orthopedic Surgery

## 2022-05-12 DIAGNOSIS — R42 Dizziness and giddiness: Secondary | ICD-10-CM | POA: Insufficient documentation

## 2022-05-12 DIAGNOSIS — M25632 Stiffness of left wrist, not elsewhere classified: Secondary | ICD-10-CM | POA: Insufficient documentation

## 2022-05-12 DIAGNOSIS — M79642 Pain in left hand: Secondary | ICD-10-CM | POA: Diagnosis present

## 2022-05-12 DIAGNOSIS — M6281 Muscle weakness (generalized): Secondary | ICD-10-CM

## 2022-05-12 DIAGNOSIS — M25642 Stiffness of left hand, not elsewhere classified: Secondary | ICD-10-CM | POA: Diagnosis present

## 2022-05-12 DIAGNOSIS — L905 Scar conditions and fibrosis of skin: Secondary | ICD-10-CM

## 2022-05-12 DIAGNOSIS — R2681 Unsteadiness on feet: Secondary | ICD-10-CM | POA: Insufficient documentation

## 2022-05-12 DIAGNOSIS — R262 Difficulty in walking, not elsewhere classified: Secondary | ICD-10-CM | POA: Diagnosis present

## 2022-05-12 NOTE — Therapy (Signed)
[place] MAIN [facility] SERVICES [address] [place], [place], [address] Phone: [phone]   Fax:  [phone]  Physical Therapy Treatment/RECERT  Patient Details  Name: Cory Espinoza MRN: [id number] Date of Birth: Sep 09, 1971 No data recorded  Encounter Date: 05/12/2022   PT End of Session - 05/12/22 1441     Visit Number 75    Number of Visits 63    Date for PT Re-Evaluation 08/04/22    Authorization Type BCBS PPO;    Authorization Time Period 6/10 PN 4/20    PT Start Time 1445    PT Stop Time 1530    PT Time Calculation (min) 45 min    Equipment Utilized During Treatment Gait belt    Activity Tolerance Patient tolerated treatment well;Treatment limited secondary to medical complications (Comment)   provoked symptoms with certain activities   Behavior During Therapy WFL for tasks assessed/performed             Past Medical History:  Diagnosis Date   Allergy    Anginal pain (HCC)    Anxiety    Arthritis    lumbar spine   Asthma    Atrial fibrillation (HCC)    Back pain    Severe Lumbar Pain   CHF (congestive heart failure) (HCC)    COPD (chronic obstructive pulmonary disease) (HCC)    Restrictive lung disease   Coronary artery disease    Dyspnea    Dysrhythmia    afib   GERD (gastroesophageal reflux disease)    Headache    [name], onset October 2020, cluster headaches in the past   History of kidney stones    Hyperlipidemia    Hypertension    Myocardial infarction ([facility])    at age 51   Pneumonia    Restrictive lung disease    Sleep apnea    unable to use cpap since onset of migraines    Past Surgical History:  Procedure Laterality Date   ABDOMINAL EXPOSURE N/A 03/25/2020   Procedure: ABDOMINAL EXPOSURE;  Surgeon: Rosetta Posner, MD;  Location: [facility] OR;  Service: Vascular;  Laterality: N/A;   ANTERIOR CERVICAL DECOMP/DISCECTOMY FUSION N/A 01/23/2020   Procedure: ANTERIOR CERVICAL DECOMPRESSION FUSION -  CERVICAL SIX-CERVICAL SEVEN;  Surgeon: Earnie Larsson, MD;  Location: [facility];  Service: Neurosurgery;  Laterality: N/A;   ANTERIOR LUMBAR FUSION N/A 03/25/2020   Procedure: ANTERIOR LUMBAR INTERBODY FUSION LUMBAR FIVE-SACRAL ONE.;  Surgeon: Earnie Larsson, MD;  Location: [facility];  Service: Neurosurgery;  Laterality: N/A;  anterior   BACK SURGERY  1996   CARDIAC CATHETERIZATION Left 04/27/2016   Procedure: Left Heart Cath and Coronary Angiography;  Surgeon: Corey Skains, MD;  Location: [facility];  Service: Cardiovascular;  Laterality: Left;   CARDIAC CATHETERIZATION N/A 04/27/2016   Procedure: Intravascular Pressure Wire/FFR Study;  Surgeon: Yolonda Kida, MD;  Location: [facility];  Service: Cardiovascular;  Laterality: N/A;   CATHETERIZATION OF PULMONARY ARTERY WITH RETRIEVAL OF FOREIGN BODY Bilateral 04/07/2011   heart   DEGENERATIVE SPONDYLOLISTHESIS     KNEE ARTHROSCOPY Bilateral 2004   LUMBAR DISC SURGERY  1999   RADICULOPATHY, CERVICAL REGION     TONSILLECTOMY      There were no vitals filed for this visit.   Subjective Assessment - 05/12/22 1550     Subjective Patient has had x ray for pneumonia due to decreased SP02 and difficulty breathing. Additionally reports increased HR. was primarily bedbound last week  due to the weather.    Patient is accompained by: Family member    Pertinent History Pt has a complex medical history. He reports that he fell during a [employer] training in [place] in the fall of 2020. He states that he fell 10-12 feet and struck his head, neck, and back. He was able to finish the course which took him approximately 45 minutes more to complete. He does endorse a second fall while finishing the course but did not suffer a second head injury. He states that he started getting headaches that day which have persisted since that time. 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 He does  report improvement in his headaches recently. Neurology was concerned about possible BPPV and have referred him for a vestibular evaluation. In addition since the injury, pt developed RUE and RLE pain and weakness. Pt states that NCV showed abnormal nerve conduction in RUE. He has since undergone a C7-7 ACDF on 01/23/20. He reports initial improvement in RUE strength, but states that he has a gradual return of his RUE weakness. He was also having significant RLE weakness and pain and underwent and L5-S1 anterior lumbar interbody fusion on 03/25/20. Patient reports that he does not have any back precautions but needs to wear the low back brace for one more month. Patient reports he has a follow-up appointment with the surgeon next month. Patient reports he has a follow-up appointment with Dr. Brigitte Pulse, neurologist, in August. He arrives to therapy ambulating with an upright rollator walker. Pt denies any mention of concussion after his injury. He has been having cognitive issues since his injury and has previously had a heavy metals screen as well as neurocognitive testing. He has had an MRI of his brain with and without contrast on 12/18/19. Results were mildly motion degraded examination. No evidence of acute intracranial abnormality. Minimal chronic small vessel ischemic disease. He has tremor in both his hands. He has a positive family history of Parkinson's disease in his grandfather. He is complaining of constant dizziness and unsteadiness which are worse with activity. Patient states that he frequently loses his balance and his wife has to steady him.    Limitations Lifting;Standing;Walking;House hold activities    Diagnostic tests He has had an MRI of his brain with and without contrast on 12/18/19. Results were mildly motion degraded examination. No evidence of acute intracranial abnormality. Minimal chronic small vessel ischemic disease.    Patient Stated Goals to be able to ambulate with a cane or no AD, to be  able to drive, to be able to ride his motorcycle    Currently in Pain? Yes    Pain Score 6     Pain Location Back    Pain Orientation Lower;Upper    Pain Descriptors / Indicators Aching    Pain Type Chronic pain    Pain Onset More than a month ago    Pain Frequency Intermittent    Pain Score 5    Pain Location Head    Pain Orientation Upper    Pain Descriptors / Indicators Aching    Pain Type Chronic pain    Pain Onset More than a month ago    Pain Frequency Constant              Goals:   TUG: 10.5 seconds with walking stick MET RUE/RLE strength RUE:  Flexion: 5/5 Bicep 5/5 Tricep 5/5 Abduction 4/5 Extension 4/5  Wrist: Flexion: 3+/5 Extension:4/5  RLE: Hip flexion 3+/5 Hip abduction 4/5 Hip adduction 4+/5 Knee extension 4/5 Knee flexion 4-/5   5x STS: 11 seconds SUE support  BERG: perform next session  Decreased frequency of falling: average 2 falls a week.      New goal:  Lumbar and cervical pain reduction to 4/10 -back 10/10 -neck pain 8/10  Patient will turn head 75% of the time without dizziness or LOB.     Treat: Black TB: chest press varying angles 10x Black TB: wrist flexion 10x Black TB: bicep curl 10x Black TB: preacher curl 10x  Ambulate 500 ft from PT gym to car with car transfer due to increased dizziness with one near LOB.   Pt educated throughout session about proper posture and technique with exercises. Improved exercise technique, movement at target joints, use of target muscles after min to mod verbal, visual, tactile cues. Rationale for Evaluation and Treatment Rehabilitation   Patient has made significant progress towards functional goals at this time. He has met his TUG goal and is one second from his 5x STS goal. His strength of his RUE has significantly improved as well. New goals addressing lumbar and cervical pain as well as head turns added to POC to improve quality of life. Decreasing frequency to 1x/week. Patient will  benefit from therapy to reduce fall risk, improve strength, and improve capacity for functional mobility for improved quality of life            PT Education - 05/12/22 1441     Education Details goals    Person(s) Educated Patient    Methods Explanation;Demonstration;Tactile cues;Verbal cues    Comprehension Verbalized understanding;Returned demonstration;Verbal cues required;Tactile cues required              PT Short Term Goals - 05/12/22 1549       PT SHORT TERM GOAL #1   Title Patient will be independent in home exercise program to improve strength/mobility for better functional independence with ADLs and for self-management.    Baseline 3/3 HEP compliant 3/31 hep compliant    Time 6    Period Weeks    Status Achieved    Target Date 02/20/21      PT SHORT TERM GOAL #2   Title Patient will be modified independent in walking on even/uneven surface with least restrictive assistive device, for 10+ minutes without rest break, reporting some difficulty or less to improve walking tolerance with community ambulation including grocery shopping, going to church,etc.    Baseline 02/03: instability, LOB multiple times while ambulating to elevator 3/31: unable to test due to throwing up 6/20: 6 minutes 7/12: unable to test due to migraine 9/12: defer to next session 11/15: unable to test due to migraine 3/2: requires heavy use of AD and seated rest breaks 4/20: walked around yard yesterday    Time 6    Period Weeks    Status Partially Met    Target Date 03/19/22      PT SHORT TERM GOAL #3   Title Patient will increase ABC scale score >80% to demonstrate better functional mobility and better confidence with ADLs.    Baseline 01/09/21: 60.625% 3/3/: 50% 3/31: 63% 6/20: 45% 7/25: 40% 9/12: 51.1% 11/15: 48% 1/17: 40% 3/2: 33% 4/20: 39% 6/6: 51%    Time 6    Period Weeks    Status Partially Met    Target Date 06/23/22  PT Long Term Goals - 05/12/22 1503        PT LONG TERM GOAL #1   Title Patient will increase FOTO score to equal to or greater than 65 to demonstrate statistically significant improvement in mobility and quality of life.    Baseline scored 41/100 on 05/21/2020; 7/22 40, 8/23: 60/100, 09/30/20=40, 01/09/21: 46 3/3: 40.3% 3/31: 49% 6/20: 45% 7/12: 50.7% 9/12: 44.7% 11/15: 40% 1/17: 40% 3/2: 42%    Time 12    Period Weeks    Status Not Met    Target Date 04/30/22      PT LONG TERM GOAL #2   Title Patient will reduce falls risk as indicated by decreased TUG time to less than 11 seconds.    Baseline scored 37.9 sec with TUG on 05/21/2020; 21seconds with elevated rollator 7/22, 8/23: 13.62 sec with up and go walker, 01/09/21: 15.81s with hiking stick 3/3: 17.98 seconds one near LOB 3/31: 15.57 seconds with walking stick 6/20: 14.8 seconds with walking stick. 7/25: 19.72 seconds with walking stick. 9/26: 17.6 seconds 11/15: unable to test due to migraine 1/17: deferred due to pain 3/2:19.25 seconds with walking stick in involved hand 4/20: deferred due to migraine and back pain 6/6: 10.5 seconds with walking stick    Time 12    Period Weeks    Status Achieved    Target Date 04/30/22      PT LONG TERM GOAL #3   Title Patient will increase right UE and LE gross strength to 4+/5 throughout to improve functional strength for independent gait, increased standing tolerance and increased ADL ability.    Baseline grossly +3/5 to -4/5 right UE and LE strength on 05/21/2020; grossly 4-/5 for RUE, grossly 4-/5 for RLE on 7/22, grossly 4-/5 RUE, grossly 4/5 RLE 3/3: see note 3/2: see note 4/20: RLE 4-/5 with quad 4/5 RUE 4/5 with bicep 3+/5 6/6: see note:    Time 12    Period Weeks    Status Partially Met    Target Date 08/04/22      PT LONG TERM GOAL #4   Title Patient will increase 10 meter walk test to >1.62ms as to improve gait speed for better community ambulation and to reduce fall risk.    Baseline on 7/22 .57 m/sself selected, fast  .73 m/s  with LOB pt reported R knee buckling, elevated rollator used, 8/23: 1.06 m/s with up and go walker, 01/09/21: 0.72 m/s with hiking stick 3/3: 0.74 m/s with walking stick 3/31: 0.81 m/s w walking stick 6/20: 1.1 m/s with walking stick    Time 12    Period Weeks    Status Achieved      PT LONG TERM GOAL #5   Title Patient will increase Berg Balance score by > 6 points to demonstrate decreased fall risk during functional activities.    Baseline 8/23: 37/56, 09/30/20=40/56, 12/20: 42/56 3/3: deferred 3/31 will perform next time; deferred due to throwing up 6/20: 46/56 7/12 38/56    Time 8    Period Weeks    Status Achieved      Additional Long Term Goals   Additional Long Term Goals Yes      PT LONG TERM GOAL #6   Title Patient (< 663years old) will complete five times sit to stand test in < 10 seconds indicating an increased LE strength and improved balance.    Baseline 8/23: 22 sec, 09/30/20=13.04 sec, 12/20: 27.05 sec, 01/09/21: 18.01s 3/3: 23 seconds no  UE support 3/31; deferred due to patient throwing up 7/12: 15.2 seconds 11/15: 26 seconds 1/17: deferred due to pain 3/2: 17.5 seconds 4/20: deferred due to migraine and back pain 6/6: 11 seconds with SUE support    Time 12    Period Weeks    Status Partially Met    Target Date 08/04/22      PT LONG TERM GOAL #7   Title Patient will improve 6 min walk test >1000 feet with LRAD for improved gait ability in community.    Baseline 8/23: not tested; 8/30 845, goal adjusted >2017f, 09/30/20=600 feet - stopped test due to R ankle catching making gait unsafe, 11/25/20= 330 feet - stopped test due to severe dizziness, vomiting/nausea, sweating making gait unsafe; 01/09/21: 720 ft with no rest breaks 3/3: deferred due to nausea 3/31: deferred due to patient throwing up 6/20: 640 ft 7/12: [id number] walking stick, no rest breaks; 9/26: 1000 ft    Time 12    Period Weeks    Status Achieved      PT LONG TERM GOAL #8   Title Patient will increase  Berg Balance score by > 6 points  (52/56) to demonstrate decreased fall risk during functional activities.    Baseline 6/20: 46/56 7/12: 38/56 with migraine 9/12: defer to next session 11/15:  unable to test due to migraine 1/17: deferred due to pain 3/2: next session 4/20: deferred due to migraine and back pain    Time 12    Period Weeks    Status Partially Met    Target Date 08/04/22      PT LONG TERM GOAL  #9   TITLE Patient will report decreased frequency of falling per week to 1x/week for decreased fall risk and improved quality of life    Baseline 12/1: 4 falls minimum a week 1/17: 1 fall last week 3/2: has been bed bound the week prior 4/20: 2-3x/week 6/6: 2x/week    Time 12    Period Weeks    Status Partially Met    Target Date 08/04/22      PT LONG TERM GOAL  #10   TITLE Patient will turn head 75% of the time without dizziness or LOB.    Baseline 6/6: patient falls and has extreme dizziness resulting in throwing up.    Time 12    Period Weeks    Status New    Target Date 08/04/22      PT LONG TERM GOAL  #11   TITLE Patient will report a worst pain of 4/10 on VAS in cervical and lumbar  to improve tolerance with ADLs and reduced symptoms with activities.    Baseline 6/6: lumbar: 10/10, cervical 8/10    Time 12    Period Weeks    Status New    Target Date 08/04/22                   Plan - 05/12/22 1549     Clinical Impression Statement Patient has made significant progress towards functional goals at this time. He has met his TUG goal and is one second from his 5x STS goal. His strength of his RUE has significantly improved as well. New goals addressing lumbar and cervical pain as well as head turns added to POC to improve quality of life. Decreasing frequency to 1x/week. Patient will benefit from therapy to reduce fall risk, improve strength, and improve capacity for functional mobility for improved quality of life  Personal Factors and Comorbidities Comorbidity  3+;Time since onset of injury/illness/exacerbation    Comorbidities anxiety, back pain, COPD, headache, HTN, MI, sleep apnea, migraines    Examination-Activity Limitations Locomotion Level;Transfers;Bed Mobility;Stand;Stairs;Sleep;Squat;Bend    Examination-Participation Restrictions Driving;Medication Management    Stability/Clinical Decision Making Unstable/Unpredictable    Rehab Potential Fair    PT Frequency 1x / week    PT Duration 12 weeks    PT Treatment/Interventions ADLs/Self Care Home Management;Canalith Repostioning;Moist Heat;Electrical Stimulation;Gait training;Stair training;Functional mobility training;Therapeutic activities;Therapeutic exercise;Balance training;Neuromuscular re-education;Patient/family education;Manual techniques;Passive range of motion;Vestibular    PT Next Visit Plan Continue with functional mobility training and strengthening;    PT Home Exercise Plan Medbridge Access Code: [email or web address]    Consulted and Agree with Plan of Care Patient;Family member/caregiver    Family Member Consulted Wife             Patient will benefit from skilled therapeutic intervention in order to improve the following deficits and impairments:  Decreased activity tolerance, Decreased balance, Decreased cognition, Decreased mobility, Difficulty walking, Abnormal gait, Decreased range of motion, Dizziness, Pain, Impaired flexibility, Decreased strength, Impaired sensation, Decreased endurance, Decreased safety awareness, Postural dysfunction, Improper body mechanics, Impaired UE functional use, Impaired vision/preception  Visit Diagnosis: Muscle weakness (generalized)  Stiffness of left hand, not elsewhere classified     Problem List Patient Active Problem List   Diagnosis Date Noted   [name] (coronary artery disease) 10/21/2021   Degenerative spondylolisthesis 03/25/2020   Physical deconditioning 03/04/2020   Lobar pneumonia, unspecified organism ([place]) 03/04/2020   Intractable  hemiplegic migraine with status migrainosus 03/02/2020   Current tobacco use 02/05/2020   Abnormal findings on diagnostic imaging of lung 02/05/2020   Shortness of breath 02/05/2020   Herniated disc, cervical 01/23/2020   Cervical spondylosis with myelopathy and radiculopathy 01/23/2020   Pre-operative respiratory examination 01/12/2020   Weakness on right side of face 12/22/2019   Osteoarthritis of spine with radiculopathy, lumbar region 12/11/2019   Intractable migraine with aura with status migrainosus 11/09/2019   Former smoker 08/27/2018   Benign essential HTN 08/27/2018   Cough productive of purulent sputum 11/08/2017   GERD (gastroesophageal reflux disease) 05/06/2017   Contusion of hand 04/22/2017   BP (high blood pressure) 04/22/2017   Oxygen desaturation 04/22/2017   Prostate lump 04/22/2017   Unstable angina ([place]) 04/27/2016   Chest pain 04/24/2016   Morbid obesity ([place]) 01/29/2016   Elevated ALT measurement 10/30/2015   Nonspecific elevation of levels of transaminase and lactic acid dehydrogenase (ldh) 10/30/2015   Reduced libido 10/29/2015   Hyperlipidemia 08/06/2015   Anxiety 08/06/2015   Atherosclerosis of coronary artery 08/06/2015   Childhood asthma 08/06/2015   Chronic obstructive pulmonary disease ([place]) 08/06/2015   OSA (obstructive sleep apnea) 08/06/2015   Anxiety disorder 08/06/2015   Atherosclerosis of native coronary artery of native heart with stable angina pectoris ([place]) 54/27/0623   Uncomplicated asthma 76/28/3151   Janna Arch, PT, DPT  05/12/2022, 3:52 PM  [facility] MAIN [facility] SERVICES [address] [place], [place], [address] Phone: [phone]   Fax:  [phone]  Name: CUSTER PIMENTA MRN: [id number] Date of Birth: Jul 09, 1971

## 2022-05-12 NOTE — Therapy (Signed)
[facility]ICINE [address] [place], [place], [address] Phone: [phone]   Fax:  [phone]  Occupational Therapy Treatment  Patient Details  Name: Cory Espinoza MRN: [id number] Date of Birth: 07/17/1971 Referring Provider (OT): Cory Espinoza   Encounter Date: 05/12/2022   OT End of Session - 05/12/22 1535     Visit Number 8    Number of Visits 16    Date for OT Re-Evaluation 05/26/22    OT Start Time 1317    OT Stop Time 1401    OT Time Calculation (min) 44 min    Activity Tolerance Patient tolerated treatment well    [name] tasks assessed/performed             Past Medical History:  Diagnosis Date   Allergy    Anginal pain (HCC)    Anxiety    Arthritis    lumbar spine   Asthma    Atrial fibrillation (HCC)    Back pain    Severe Lumbar Pain   CHF (congestive heart failure) (HCC)    COPD (chronic obstructive pulmonary disease) (HCC)    Restrictive lung disease   Coronary artery disease    Dyspnea    Dysrhythmia    afib   GERD (gastroesophageal reflux disease)    Headache    [name], onset October 2020, cluster headaches in the past   History of kidney stones    Hyperlipidemia    Hypertension    Myocardial infarction (HCC)    at age 51   Pneumonia    Restrictive lung disease    Sleep apnea    unable to use cpap since onset of migraines    Past Surgical History:  Procedure Laterality Date   ABDOMINAL EXPOSURE N/A 03/25/2020   Procedure: ABDOMINAL EXPOSURE;  Surgeon: Larina EarthlyEarly, Cory F, MD;  [facility];  Service: Vascular;  Laterality: N/A;   ANTERIOR CERVICAL DECOMP/DISCECTOMY FUSION N/A 01/23/2020   Procedure: ANTERIOR CERVICAL DECOMPRESSION FUSION - CERVICAL SIX-CERVICAL SEVEN;  Surgeon: Cory Espinoza, Henry, MD;  Location: [facility] OR;  Service: Neurosurgery;  Laterality: N/A;   ANTERIOR LUMBAR FUSION N/A 03/25/2020   Procedure: ANTERIOR LUMBAR INTERBODY FUSION LUMBAR  FIVE-SACRAL ONE.;  Surgeon: Cory Espinoza, Henry, MD;  Location: [facility] OR;  Service: Neurosurgery;  Laterality: N/A;  anterior   BACK SURGERY  1996   CARDIAC CATHETERIZATION Left 04/27/2016   Procedure: Left Heart Cath and Coronary Angiography;  Surgeon: Cory BlinksBruce J Kowalski, MD;  Location: [facility] INVASIVE CV LAB;  Service: Cardiovascular;  Laterality: Left;   CARDIAC CATHETERIZATION N/A 04/27/2016   Procedure: Intravascular Pressure Wire/FFR Study;  Surgeon: Cory Cory D Callwood, MD;  Location: [facility] INVASIVE CV LAB;  Service: Cardiovascular;  Laterality: N/A;   CATHETERIZATION OF PULMONARY ARTERY WITH RETRIEVAL OF FOREIGN BODY Bilateral 04/07/2011   heart   DEGENERATIVE SPONDYLOLISTHESIS     KNEE ARTHROSCOPY Bilateral 2004   LUMBAR DISC SURGERY  1999   RADICULOPATHY, CERVICAL REGION     TONSILLECTOMY      There were no vitals filed for this visit.   Subjective Assessment - 05/12/22 1533     Subjective  I fell again last week in the hallway to the bathroom.  And then I pick up my granddaughter to was just hurting since then my wrist to my palm.  So I did not wear my gloves and my wrist brace again.    Pertinent History Note from Cory Cory PalingPidgeon- Left hand aneurysm.  Previous History:  02/09/2022: Per  Cory Brookes, MD on 02/07/2022: "Mr. Cory Espinoza is a 51 y.o. 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 The patient has since developed a pulsating mass in the left palm which intermittently bleeds with a pulsating pattern. He was recently seen in the [facility] Emergency Room on 02/07/2022 where they had difficulty getting the bleeding under control. He was transferred to the [facility] where the bleeding was under control and he was sent home. He is here for routine follow-up. He has previously been seen as an outpatient by Cory. Deeann Espinoza for this issue who ultimately had elected to proceed with nonoperative management. That visit was on 01/29/2022.     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 At his last visit, he was sent for an angiogram. He is here to discuss the results.    Today, the patient reports his aneurysm is bleeding. He stopped taking anticoagulant medication Plavix prescribed by his former cardiologist. He is very interested in moving forward with surgery.On 03/25/22 seen last time by surgeon - Left carpal tunnel release, left hand, distal vessel aneurysm excision and repair of the vessel by Cory. Doran Espinoza 3/29/2023When I saw the patient last on 03/18/2022, [name]    Patient Stated Goals My left hand is my strong hand and need to use my walking stick to prevent me to fall.  As well as to work in my shop doing woodwork I use mostly my left hand I am very dependent on my left hand    Currently in Pain? Yes    Pain Score 3     Pain  Location Wrist    Pain Orientation Left    Pain Descriptors / Indicators Tender    Pain Type Acute pain                [facility] OT Assessment - 05/12/22 0001       AROM   Left Wrist Extension 70 Degrees    Left Wrist Flexion 70 Degrees   pain  - increase after fluido/contrast 90                     OT Treatments/Exercises (OP) - 05/12/22 0001       LUE Fluidotherapy   Number Minutes Fluidotherapy 10 Minutes  LUE Fluidotherapy Location Hand;Wrist    Comments with ice rotation for contrast decreae edema and pain               Patient with increased pain over volar wrist and proximal palm with positive Tinel and tenderness.  Decreased wrist flexion with increased pain wrist extension about 70.   Scar tissue very thick in the palm but improving in the surroundings and distally.   Scar massage done by OT manually as well as using mini massager.  Did use extractor over 1 area on ulnar side of scar with active range of motion and extension of digits tolerating well Metacarpal spreads by OT as well as carpal tunnel spreads and educated wife and patient on doing at home to decrease scar tissue. Using of Cica -Care scar pad at nighttime under Isotoner glove for swelling Composite extension of wrist digits PROM during session using the right hand 8 reps slight pull.   Followed by composite fist active assisted range to palm a 10 reps each. After soft tissue massage as well as contrast patient had decrease edema around scar tissue with increased MC flexion as well as composite flexion with less pain and compensation with wrist flexion during composite fist.   Also able to do opposition to fifth digit pad to bed.    Reinforced with patient importance of contrast 3 times a day especially after he reinjured it with a fall.  Patient was reporting only doing it 1 time he can continue with Isotoner glove and wrist brace. Patient continue with therapy and exercises for elbow  extension shoulder protraction as well as forearm and wrist.  We will reassess next session But upon the assessment today had increased strength.     OT Education - 05/12/22 1535     Education Details Home program changes as well as nerve healing    Person(s) Educated Patient    Methods Explanation;Demonstration;Tactile cues;Verbal cues;Handout    Comprehension Verbal cues required;Returned demonstration;Verbalized understanding              OT Short Term Goals - 03/31/22 1808       OT SHORT TERM GOAL #1   Title Patient to be independent in home program to decrease edema, decreased pain as well as scar tissue to increase digit flexion to touching palm.    Baseline Patient has no knowledge of home program to decrease scar tissue, decrease pain and edema pain t2-10/10 with active range of motion, increase edema as well as scar tissue in palm with 1 inch x 1 inch area that still healing with scab.  Patient hypersensitive to massage and rougher textures.  MC flexion is 55-70 degrees, PIPs 60 degrees - 90 degrees.    Time 3    Period Weeks    Status New    Target Date 04/21/22               OT Long Term Goals - 03/31/22 1810       OT LONG TERM GOAL #1   Title Patient's left hand scar tissue improve for patient to show increase active range of motion as well as tolerating different textures, tapping and gripping tools of different textures in ADLs and IADLs    Baseline Scar is Z scar thick, adhere, 1 inch x 1 inch area of the still healing with scab-patient hypersensitive can tolerate light massage and touch but not scar tissue massage and rougher textures.    Time 5    Period  Weeks    Status New    Target Date 05/05/22      OT LONG TERM GOAL #2   Title Patient left hand digits flexion improve touching palm to be able to use walking stick while maintaining full extension with pain less than 2/10    Baseline MC flexion in digits 55-70 degrees, PIPs 60 degrees - 90 degrees,  unable to use walking stick or gripping tools because of scar sensitivity and tightness as well as stiffness in hand.    Time 6    Period Weeks    Status New    Target Date 05/12/22      OT LONG TERM GOAL #3   Title Left wrist active range of motion improved to within normal limits for patient to push up from chair as well as use hand in light would work    Baseline Wrist extension 30 degrees with pain, flexion 62 degrees on left hand.  Limited by scar tissue.  Not able to use hand    Time 8    Period Weeks    Status New    Target Date 05/26/22      OT LONG TERM GOAL #4   Title Patient's left hand grip strength and prehension increased to within functional limits for his age to be able to use hand and use of walking stick, holding a plate and starting to use and woodwork.    Baseline Patient is tomorrow 4 weeks postop, scar thick and adherent and tender to massage and rougher textures, grip strength not tested yet unable to touch palm, increase stiffness in MCPs and PIPs flexion.    Time 8    Period Weeks    Status New    Target Date 05/26/22                   Plan - 05/12/22 1536     Clinical Impression Statement Patient present at OT evaluation with a diagnosis of postop left carpal tunnel release with repair of arterial vessel and excision of aneurysm.  Surgery date was 03/04/2022.  Patient is now about 9  weeks postop.  Patient made great progress from start of care but continued to have some thick scar tissue in the palm as well as having some falls because of migraines and catching himself with that hand causing increased inflammation and tenderness at times.  Patient arrived this date again after having a fall wearing his wrist brace and Isotoner glove.  Patient with increased edema causing increased stiffness in with increased scar tissue increased pain in her volar wrist with a positive Tinel over distal wrist as well as proximal palm.  Reinforced again with patient  importance of contrast after he injured it or fall on his hand to decrease inflammation to be able to do scar tissue and increase active range of motion.  Patient scar tissue is improving as well as wrist motion.  After patient's last fall OT did discuss with patient some work analysis in his workshop.  Patient fatiguing and losing concentration because of his brain injury, migraines with increased weakness in the right upper extremity as well as losing focus.  Discussed with patient taking breaks, organization and workshop as well as barstool 2 chairs to sit again for rest breaks.  Patient reports weakness in right upper extremity from cervical and brain injury patient with decreased strength in upper extremity with shoulder horizontal abduction, abduction, protraction elbow extension forearm and wrist strength in all planes.  Since last time taken grip strength improved bilaterally as well as three-point pinch.  Patient to report he has a good day with his migraines and that affects also grip strength and prehension.  Patient continues to be limited in scar tissue and composite flexion with maintaining wrist neutral..  Patient decrease in opposition to 5th and fine motor coordination but improving..  As well as decreased strength because of scar tissue, stiffness and pain limiting his functional use of left hand in ADLs and IADLs.  Patient can benefit from skilled OT services. Add this date to plan R UE strenghning to increase his independeince in IADLS and prvent injuries.    OT Occupational Profile and History Problem Focused Assessment - Including review of records relating to presenting problem    Occupational performance deficits (Please refer to evaluation for details): ADL's;IADL's;Play;Leisure;Social Participation    Body Structure / Function / Physical Skills ADL;Strength;Pain;Dexterity;Edema;UE functional use;ROM;IADL;Scar mobility;Flexibility;Sensation;Skin integrity;Decreased knowledge of  precautions    Rehab Potential Good    Clinical Decision Making Several treatment options, min-mod task modification necessary    Comorbidities Affecting Occupational Performance: None    Modification or Assistance to Complete Evaluation  No modification of tasks or assist necessary to complete eval    OT Frequency 2x / week    OT Duration 8 weeks    OT Treatment/Interventions Self-care/ADL training;Moist Heat;Fluidtherapy;DME and/or AE instruction;Contrast Bath;Therapeutic activities;Scar mobilization;Therapeutic exercise;Paraffin;Manual Therapy;Patient/family education;Passive range of motion    Consulted and Agree with Plan of Care Patient;Family member/caregiver             Patient will benefit from skilled therapeutic intervention in order to improve the following deficits and impairments:   Body Structure / Function / Physical Skills: ADL, Strength, Pain, Dexterity, Edema, UE functional use, ROM, IADL, Scar mobility, Flexibility, Sensation, Skin integrity, Decreased knowledge of precautions       Visit Diagnosis: Muscle weakness (generalized)  Stiffness of left hand, not elsewhere classified  Scar tissue  Pain in left hand  Stiffness of left wrist, not elsewhere classified    Problem List Patient Active Problem List   Diagnosis Date Noted   [name] (coronary artery disease) 10/21/2021   Degenerative spondylolisthesis 03/25/2020   Physical deconditioning 03/04/2020   Lobar pneumonia, unspecified organism (HCC) 03/04/2020   Intractable hemiplegic migraine with status migrainosus 03/02/2020   Current tobacco use 02/05/2020   Abnormal findings on diagnostic imaging of lung 02/05/2020   Shortness of breath 02/05/2020   Herniated disc, cervical 01/23/2020   Cervical spondylosis with myelopathy and radiculopathy 01/23/2020   Pre-operative respiratory examination 01/12/2020   Weakness on right side of face 12/22/2019   Osteoarthritis of spine with radiculopathy, lumbar  region 12/11/2019   Intractable migraine with aura with status migrainosus 11/09/2019   Former smoker 08/27/2018   Benign essential HTN 08/27/2018   Cough productive of purulent sputum 11/08/2017   GERD (gastroesophageal reflux disease) 05/06/2017   Contusion of hand 04/22/2017   BP (high blood pressure) 04/22/2017   Oxygen desaturation 04/22/2017   Prostate lump 04/22/2017   Unstable angina (HCC) 04/27/2016   Chest pain 04/24/2016   Morbid obesity (HCC) 01/29/2016   Elevated ALT measurement 10/30/2015   Nonspecific elevation of levels of transaminase and lactic acid dehydrogenase (ldh) 10/30/2015   Reduced libido 10/29/2015   Hyperlipidemia 08/06/2015   Anxiety 08/06/2015   Atherosclerosis of coronary artery 08/06/2015   Childhood asthma 08/06/2015   Chronic obstructive pulmonary disease (HCC) 08/06/2015   OSA (obstructive sleep apnea) 08/06/2015   Anxiety  disorder 08/06/2015   Atherosclerosis of native coronary artery of native heart with stable angina pectoris (HCC) 08/06/2015   Uncomplicated asthma 08/06/2015    Oletta Cohn, OTR/L,CLT 05/12/2022, 3:41 PM  [facility] PHYSICAL AND SPORTS MEDICINE [address], [place], [address] Phone: [phone]   Fax:  [phone]  Name: Cory Espinoza MRN: [id number] Date of Birth: Apr 15, 1971

## 2022-05-14 ENCOUNTER — Ambulatory Visit: Payer: BC Managed Care – PPO

## 2022-05-14 ENCOUNTER — Ambulatory Visit: Payer: BC Managed Care – PPO | Admitting: Occupational Therapy

## 2022-05-19 ENCOUNTER — Ambulatory Visit: Payer: BC Managed Care – PPO

## 2022-05-19 ENCOUNTER — Ambulatory Visit: Payer: BC Managed Care – PPO | Admitting: Occupational Therapy

## 2022-05-19 DIAGNOSIS — M25632 Stiffness of left wrist, not elsewhere classified: Secondary | ICD-10-CM

## 2022-05-19 DIAGNOSIS — L905 Scar conditions and fibrosis of skin: Secondary | ICD-10-CM

## 2022-05-19 DIAGNOSIS — R262 Difficulty in walking, not elsewhere classified: Secondary | ICD-10-CM

## 2022-05-19 DIAGNOSIS — R42 Dizziness and giddiness: Secondary | ICD-10-CM

## 2022-05-19 DIAGNOSIS — M6281 Muscle weakness (generalized): Secondary | ICD-10-CM | POA: Diagnosis not present

## 2022-05-19 DIAGNOSIS — M79642 Pain in left hand: Secondary | ICD-10-CM

## 2022-05-19 DIAGNOSIS — R2681 Unsteadiness on feet: Secondary | ICD-10-CM

## 2022-05-19 DIAGNOSIS — M25642 Stiffness of left hand, not elsewhere classified: Secondary | ICD-10-CM

## 2022-05-19 NOTE — Therapy (Signed)
[place] MAIN [facility] SERVICES [address] [place], [place], [address] Phone: [phone]   Fax:  [phone]  Physical Therapy Treatment  Patient Details  Name: Cory Espinoza MRN: [id number] Date of Birth: 01-18-1971 No data recorded  Encounter Date: 05/19/2022   PT End of Session - 05/19/22 1612     Visit Number 72    Number of Visits 34    Date for PT Re-Evaluation 08/04/22    Authorization Type BCBS PPO;    Authorization Time Period 7/10 PN 4/20    PT Start Time 1515    PT Stop Time 1600    PT Time Calculation (min) 45 min    Equipment Utilized During Treatment Gait belt    Activity Tolerance Patient tolerated treatment well;Treatment limited secondary to medical complications (Comment)   provoked symptoms with certain activities   Behavior During Therapy WFL for tasks assessed/performed             Past Medical History:  Diagnosis Date   Allergy    Anginal pain (HCC)    Anxiety    Arthritis    lumbar spine   Asthma    Atrial fibrillation (HCC)    Back pain    Severe Lumbar Pain   CHF (congestive heart failure) (HCC)    COPD (chronic obstructive pulmonary disease) (HCC)    Restrictive lung disease   Coronary artery disease    Dyspnea    Dysrhythmia    afib   GERD (gastroesophageal reflux disease)    Headache    [name], onset October 2020, cluster headaches in the past   History of kidney stones    Hyperlipidemia    Hypertension    Myocardial infarction ([place])    at age 9   Pneumonia    Restrictive lung disease    Sleep apnea    unable to use cpap since onset of migraines    Past Surgical History:  Procedure Laterality Date   ABDOMINAL EXPOSURE N/A 03/25/2020   Procedure: ABDOMINAL EXPOSURE;  Surgeon: Rosetta Posner, MD;  Location: [facility] OR;  Service: Vascular;  Laterality: N/A;   ANTERIOR CERVICAL DECOMP/DISCECTOMY FUSION N/A 01/23/2020   Procedure: ANTERIOR CERVICAL DECOMPRESSION FUSION - CERVICAL  SIX-CERVICAL SEVEN;  Surgeon: Earnie Larsson, MD;  Location: [place];  Service: Neurosurgery;  Laterality: N/A;   ANTERIOR LUMBAR FUSION N/A 03/25/2020   Procedure: ANTERIOR LUMBAR INTERBODY FUSION LUMBAR FIVE-SACRAL ONE.;  Surgeon: Earnie Larsson, MD;  Location: [place];  Service: Neurosurgery;  Laterality: N/A;  anterior   BACK SURGERY  1996   CARDIAC CATHETERIZATION Left 04/27/2016   Procedure: Left Heart Cath and Coronary Angiography;  Surgeon: Corey Skains, MD;  Location: [place] CV LAB;  Service: Cardiovascular;  Laterality: Left;   CARDIAC CATHETERIZATION N/A 04/27/2016   Procedure: Intravascular Pressure Wire/FFR Study;  Surgeon: Yolonda Kida, MD;  Location: [place] [facility];  Service: Cardiovascular;  Laterality: N/A;   CATHETERIZATION OF PULMONARY ARTERY WITH RETRIEVAL OF FOREIGN BODY Bilateral 04/07/2011   heart   DEGENERATIVE SPONDYLOLISTHESIS     KNEE ARTHROSCOPY Bilateral 2004   LUMBAR DISC SURGERY  1999   RADICULOPATHY, CERVICAL REGION     TONSILLECTOMY      There were no vitals filed for this visit.   Subjective Assessment - 05/19/22 1610     Subjective Patient has had two falls since last session. Has also power washed the house but "paid for it afterwards"    Patient  is accompained by: Family member    Pertinent History Pt has a complex medical history. 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 He states that he fell 10-12 feet and struck his head, neck, and back. He was able to finish the course which took him approximately 45 minutes more to complete. He does endorse a second fall while finishing the course but did not suffer a second head injury. He states that he started getting headaches that day which have persisted since that time. 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 He does report improvement in his headaches recently. Neurology was concerned  about possible BPPV and have referred him for a vestibular evaluation. In addition since the injury, pt developed RUE and RLE pain and weakness. Pt states that NCV showed abnormal nerve conduction in RUE. He has since undergone a C7-7 ACDF on 01/23/20. He reports initial improvement in RUE strength, but states that he has a gradual return of his RUE weakness. He was also having significant RLE weakness and pain and underwent and L5-S1 anterior lumbar interbody fusion on 03/25/20. Patient reports that he does not have any back precautions but needs to wear the low back brace for one more month. Patient reports he has a follow-up appointment with the surgeon next month. Patient reports he has a follow-up appointment with Dr. Brigitte Pulse, neurologist, in August. He arrives to therapy ambulating with an upright rollator walker. Pt denies any mention of concussion after his injury. He has been having cognitive issues since his injury and has previously had a heavy metals screen as well as neurocognitive testing. He has had an MRI of his brain with and without contrast on 12/18/19. Results were mildly motion degraded examination. No evidence of acute intracranial abnormality. Minimal chronic small vessel ischemic disease. He has tremor in both his hands. He has a positive family history of Parkinson's disease in his grandfather. He is complaining of constant dizziness and unsteadiness which are worse with activity. Patient states that he frequently loses his balance and his wife has to steady him.    Limitations Lifting;Standing;Walking;House hold activities    Diagnostic tests He has had an MRI of his brain with and without contrast on 12/18/19. Results were mildly motion degraded examination. No evidence of acute intracranial abnormality. Minimal chronic small vessel ischemic disease.    Patient Stated Goals to be able to ambulate with a cane or no AD, to be able to drive, to be able to ride his motorcycle    Currently in  Pain? Yes    Pain Score 6     Pain Location Back    Pain Orientation Lower    Pain Descriptors / Indicators Aching    Pain Type Chronic pain    Pain Onset More than a month ago    Pain Frequency Intermittent    Pain Onset More than a month ago              TREATMENT     5lb ankle weight: -LAQ 15x each LE -march 15x each LE -hip abduction/adduction  15x each LE -heel raise 20x -lateral step over green theraband 10x each LE     green swiss ball TrA activation 10x 3 second holds Green swiss ball TrA with alternating UE raise 10x each side   core activation with manual knitting of abdomen musculature to center 10x  BTB: -adduction 15x each LE -abduction 15x  -hamstring curl 15x each LE   Ambulate >600 ft with walking stick in LUE, close CGA       Pt educated throughout session about proper posture and technique with exercises. Improved exercise technique, movement at target joints, use of target muscles after min to mod verbal, visual, tactile cues.    Patient requires education on diastasis recti as he has signs and symptoms indicating diagnosis. Patient educated on core stabilization technique with breathing patterns, demonstrated understanding. Patient will benefit from therapy to reduce fall risk, improve strength, and improve capacity for functional mobility for improved quality of life                        PT Education - 05/19/22 1612     Education Details exercise technique, body mechanics, diastasis recti    Person(s) Educated Patient    Methods Explanation;Demonstration;Tactile cues;Verbal cues    Comprehension Verbalized understanding;Returned demonstration;Verbal cues required;Tactile cues required              PT Short Term Goals - 05/12/22 1549       PT SHORT TERM GOAL #1   Title Patient will be independent in home exercise program to improve strength/mobility for better functional independence with ADLs and for  self-management.    Baseline 3/3 HEP compliant 3/31 hep compliant    Time 6    Period Weeks    Status Achieved    Target Date 02/20/21      PT SHORT TERM GOAL #2   Title Patient will be modified independent in walking on even/uneven surface with least restrictive assistive device, for 10+ minutes without rest break, reporting some difficulty or less to improve walking tolerance with community ambulation including grocery shopping, going to church,etc.    Baseline 02/03: instability, LOB multiple times while ambulating to elevator 3/31: unable to test due to throwing up 6/20: 6 minutes 7/12: unable to test due to migraine 9/12: defer to next session 11/15: unable to test due to migraine 3/2: requires heavy use of AD and seated rest breaks 4/20: walked around yard yesterday    Time 6    Period Weeks    Status Partially Met    Target Date 03/19/22      PT SHORT TERM GOAL #3   Title Patient will increase ABC scale score >80% to demonstrate better functional mobility and better confidence with ADLs.    Baseline 01/09/21: 60.625% 3/3/: 50% 3/31: 63% 6/20: 45% 7/25: 40% 9/12: 51.1% 11/15: 48% 1/17: 40% 3/2: 33% 4/20: 39% 6/6: 51%    Time 6    Period Weeks    Status Partially Met    Target Date 06/23/22               PT Long Term Goals - 05/12/22 1503       PT LONG TERM GOAL #1   Title Patient will increase FOTO score to equal to or greater than 65 to demonstrate statistically significant improvement in mobility and quality of life.    Baseline scored 41/100 on 05/21/2020; 7/22 40, 8/23: 60/100, 09/30/20=40, 01/09/21: 46 3/3: 40.3% 3/31: 49% 6/20: 45% 7/12: 50.7% 9/12: 44.7% 11/15: 40% 1/17: 40% 3/2: 42%    Time 12    Period Weeks    Status Not Met    Target Date 04/30/22      PT LONG TERM GOAL #2   Title Patient will reduce falls risk  as indicated by decreased TUG time to less than 11 seconds.    Baseline scored 37.9 sec with TUG on 05/21/2020; 21seconds with elevated rollator 7/22,  8/23: 13.62 sec with up and go walker, 01/09/21: 15.81s with hiking stick 3/3: 17.98 seconds one near LOB 3/31: 15.57 seconds with walking stick 6/20: 14.8 seconds with walking stick. 7/25: 19.72 seconds with walking stick. 9/26: 17.6 seconds 11/15: unable to test due to migraine 1/17: deferred due to pain 3/2:19.25 seconds with walking stick in involved hand 4/20: deferred due to migraine and back pain 6/6: 10.5 seconds with walking stick    Time 12    Period Weeks    Status Achieved    Target Date 04/30/22      PT LONG TERM GOAL #3   Title Patient will increase right UE and LE gross strength to 4+/5 throughout to improve functional strength for independent gait, increased standing tolerance and increased ADL ability.    Baseline grossly +3/5 to -4/5 right UE and LE strength on 05/21/2020; grossly 4-/5 for RUE, grossly 4-/5 for RLE on 7/22, grossly 4-/5 RUE, grossly 4/5 RLE 3/3: see note 3/2: see note 4/20: RLE 4-/5 with quad 4/5 RUE 4/5 with bicep 3+/5 6/6: see note:    Time 12    Period Weeks    Status Partially Met    Target Date 08/04/22      PT LONG TERM GOAL #4   Title Patient will increase 10 meter walk test to >1.34ms as to improve gait speed for better community ambulation and to reduce fall risk.    Baseline on 7/22 .57 m/sself selected, fast  .73 m/s with LOB pt reported R knee buckling, elevated rollator used, 8/23: 1.06 m/s with up and go walker, 01/09/21: 0.72 m/s with hiking stick 3/3: 0.74 m/s with walking stick 3/31: 0.81 m/s w walking stick 6/20: 1.1 m/s with walking stick    Time 12    Period Weeks    Status Achieved      PT LONG TERM GOAL #5   Title Patient will increase Berg Balance score by > 6 points to demonstrate decreased fall risk during functional activities.    Baseline 8/23: 37/56, 09/30/20=40/56, 12/20: 42/56 3/3: deferred 3/31 will perform next time; deferred due to throwing up 6/20: 46/56 7/12 38/56    Time 8    Period Weeks    Status Achieved       Additional Long Term Goals   Additional Long Term Goals Yes      PT LONG TERM GOAL #6   Title Patient (< 667years old) will complete five times sit to stand test in < 10 seconds indicating an increased LE strength and improved balance.    Baseline 8/23: 22 sec, 09/30/20=13.04 sec, 12/20: 27.05 sec, 01/09/21: 18.01s 3/3: 23 seconds no UE support 3/31; deferred due to patient throwing up 7/12: 15.2 seconds 11/15: 26 seconds 1/17: deferred due to pain 3/2: 17.5 seconds 4/20: deferred due to migraine and back pain 6/6: 11 seconds with SUE support    Time 12    Period Weeks    Status Partially Met    Target Date 08/04/22      PT LONG TERM GOAL #7   Title Patient will improve 6 min walk test >1000 feet with LRAD for improved gait ability in community.    Baseline 8/23: not tested; 8/30 845, goal adjusted >20013f 09/30/20=600 feet - stopped test due to R ankle catching making gait unsafe, 11/25/20=  330 feet - stopped test due to severe dizziness, vomiting/nausea, sweating making gait unsafe; 01/09/21: 720 ft with no rest breaks 3/3: deferred due to nausea 3/31: deferred due to patient throwing up 6/20: 640 ft 7/12: 664f with walking stick, no rest breaks; 9/26: 1000 ft    Time 12    Period Weeks    Status Achieved      PT LONG TERM GOAL #8   Title Patient will increase Berg Balance score by > 6 points  (52/56) to demonstrate decreased fall risk during functional activities.    Baseline 6/20: 46/56 7/12: 38/56 with migraine 9/12: defer to next session 11/15:  unable to test due to migraine 1/17: deferred due to pain 3/2: next session 4/20: deferred due to migraine and back pain    Time 12    Period Weeks    Status Partially Met    Target Date 08/04/22      PT LONG TERM GOAL  #9   TITLE Patient will report decreased frequency of falling per week to 1x/week for decreased fall risk and improved quality of life    Baseline 12/1: 4 falls minimum a week 1/17: 1 fall last week 3/2: has been bed bound  the week prior 4/20: 2-3x/week 6/6: 2x/week    Time 12    Period Weeks    Status Partially Met    Target Date 08/04/22      PT LONG TERM GOAL  #10   TITLE Patient will turn head 75% of the time without dizziness or LOB.    Baseline 6/6: patient falls and has extreme dizziness resulting in throwing up.    Time 12    Period Weeks    Status New    Target Date 08/04/22      PT LONG TERM GOAL  #11   TITLE Patient will report a worst pain of 4/10 on VAS in cervical and lumbar  to improve tolerance with ADLs and reduced symptoms with activities.    Baseline 6/6: lumbar: 10/10, cervical 8/10    Time 12    Period Weeks    Status New    Target Date 08/04/22                   Plan - 05/19/22 1616     Clinical Impression Statement Patient requires education on diastasis recti as he has signs and symptoms indicating diagnosis. Patient educated on core stabilization technique with breathing patterns, demonstrated understanding. Patient will benefit from therapy to reduce fall risk, improve strength, and improve capacity for functional mobility for improved quality of life    Personal Factors and Comorbidities Comorbidity 3+;Time since onset of injury/illness/exacerbation    Comorbidities anxiety, back pain, COPD, headache, HTN, MI, sleep apnea, migraines    Examination-Activity Limitations Locomotion Level;Transfers;Bed Mobility;Stand;Stairs;Sleep;Squat;Bend    Examination-Participation Restrictions Driving;Medication Management    Stability/Clinical Decision Making Unstable/Unpredictable    Rehab Potential Fair    PT Frequency 1x / week    PT Duration 12 weeks    PT Treatment/Interventions ADLs/Self Care Home Management;Canalith Repostioning;Moist Heat;Electrical Stimulation;Gait training;Stair training;Functional mobility training;Therapeutic activities;Therapeutic exercise;Balance training;Neuromuscular re-education;Patient/family education;Manual techniques;Passive range of  motion;Vestibular    PT Next Visit Plan Continue with functional mobility training and strengthening;    PT Home Exercise Plan Medbridge Access Code: [email or web address]    Consulted and Agree with Plan of Care Patient;Family member/caregiver    Family Member Consulted Wife             Patient  will benefit from skilled therapeutic intervention in order to improve the following deficits and impairments:  Decreased activity tolerance, Decreased balance, Decreased cognition, Decreased mobility, Difficulty walking, Abnormal gait, Decreased range of motion, Dizziness, Pain, Impaired flexibility, Decreased strength, Impaired sensation, Decreased endurance, Decreased safety awareness, Postural dysfunction, Improper body mechanics, Impaired UE functional use, Impaired vision/preception  Visit Diagnosis: Muscle weakness (generalized)  Unsteadiness on feet  Difficulty in walking, not elsewhere classified  Dizziness and giddiness     Problem List Patient Active Problem List   Diagnosis Date Noted   [name] (coronary artery disease) 10/21/2021   Degenerative spondylolisthesis 03/25/2020   Physical deconditioning 03/04/2020   Lobar pneumonia, unspecified organism ([place]) 03/04/2020   Intractable hemiplegic migraine with status migrainosus 03/02/2020   Current tobacco use 02/05/2020   Abnormal findings on diagnostic imaging of lung 02/05/2020   Shortness of breath 02/05/2020   Herniated disc, cervical 01/23/2020   Cervical spondylosis with myelopathy and radiculopathy 01/23/2020   Pre-operative respiratory examination 01/12/2020   Weakness on right side of face 12/22/2019   Osteoarthritis of spine with radiculopathy, lumbar region 12/11/2019   Intractable migraine with aura with status migrainosus 11/09/2019   Former smoker 08/27/2018   Benign essential HTN 08/27/2018   Cough productive of purulent sputum 11/08/2017   GERD (gastroesophageal reflux disease) 05/06/2017   Contusion of hand 04/22/2017    BP (high blood pressure) 04/22/2017   Oxygen desaturation 04/22/2017   Prostate lump 04/22/2017   Unstable angina ([place]) 04/27/2016   Chest pain 04/24/2016   Morbid obesity ([place]) 01/29/2016   Elevated ALT measurement 10/30/2015   Nonspecific elevation of levels of transaminase and lactic acid dehydrogenase (ldh) 10/30/2015   Reduced libido 10/29/2015   Hyperlipidemia 08/06/2015   Anxiety 08/06/2015   Atherosclerosis of coronary artery 08/06/2015   Childhood asthma 08/06/2015   Chronic obstructive pulmonary disease ([place]) 08/06/2015   OSA (obstructive sleep apnea) 08/06/2015   Anxiety disorder 08/06/2015   Atherosclerosis of native coronary artery of native heart with stable angina pectoris ([place]) 10/22/5207   Uncomplicated asthma 01/29/3611    Janna Arch, PT, DPT  05/19/2022, 4:17 PM  [place] MAIN [facility] SERVICES [place], [place], [address] Phone: [phone]   Fax:  [phone]  Name: Cory Espinoza MRN: [id number] Date of Birth: 05-26-1971

## 2022-05-19 NOTE — Therapy (Signed)
[place] [facility] [address] [place], [place], [address] Phone: [phone]   Fax:  [phone]  Occupational Therapy Treatment  Patient Details  Name: Cory Espinoza MRN: [id number] Date of Birth: Oct 27, 1971 Referring Provider (OT): Dr Cathie Hoops   Encounter Date: 05/19/2022   OT End of Session - 05/19/22 1701     Visit Number 9    Number of Visits 16    Date for OT Re-Evaluation 05/26/22    OT Start Time 1403    OT Stop Time 1447    OT Time Calculation (min) 44 min    Activity Tolerance Patient tolerated treatment well    Behavior During Therapy [facility] for tasks assessed/performed             Past Medical History:  Diagnosis Date   Allergy    Anginal pain (HCC)    Anxiety    Arthritis    lumbar spine   Asthma    Atrial fibrillation (HCC)    Back pain    Severe Lumbar Pain   CHF (congestive heart failure) (HCC)    COPD (chronic obstructive pulmonary disease) (HCC)    Restrictive lung disease   Coronary artery disease    Dyspnea    Dysrhythmia    afib   GERD (gastroesophageal reflux disease)    Headache    [name], onset October 2020, cluster headaches in the past   History of kidney stones    Hyperlipidemia    Hypertension    Myocardial infarction (HCC)    at age 7   Pneumonia    Restrictive lung disease    Sleep apnea    unable to use cpap since onset of migraines    Past Surgical History:  Procedure Laterality Date   ABDOMINAL EXPOSURE N/A 03/25/2020   Procedure: ABDOMINAL EXPOSURE;  Surgeon: Larina Earthly, MD;  Location: [facility] OR;  Service: Vascular;  Laterality: N/A;   ANTERIOR CERVICAL DECOMP/DISCECTOMY FUSION N/A 01/23/2020   Procedure: ANTERIOR CERVICAL DECOMPRESSION FUSION - CERVICAL SIX-CERVICAL SEVEN;  Surgeon: Julio Sicks, MD;  Location: [facility] OR;  Service: Neurosurgery;  Laterality: N/A;   ANTERIOR LUMBAR FUSION N/A 03/25/2020   Procedure: ANTERIOR LUMBAR INTERBODY FUSION LUMBAR  FIVE-SACRAL ONE.;  Surgeon: Julio Sicks, MD;  Location: [facility] OR;  Service: Neurosurgery;  Laterality: N/A;  anterior   BACK SURGERY  1996   CARDIAC CATHETERIZATION Left 04/27/2016   Procedure: Left Heart Cath and Coronary Angiography;  Surgeon: Lamar Blinks, MD;  Location: [facility] INVASIVE CV LAB;  Service: Cardiovascular;  Laterality: Left;   CARDIAC CATHETERIZATION N/A 04/27/2016   Procedure: Intravascular Pressure Wire/FFR Study;  Surgeon: Alwyn Pea, MD;  Location: [facility] INVASIVE CV LAB;  Service: Cardiovascular;  Laterality: N/A;   CATHETERIZATION OF PULMONARY ARTERY WITH RETRIEVAL OF FOREIGN BODY Bilateral 04/07/2011   heart   DEGENERATIVE SPONDYLOLISTHESIS     KNEE ARTHROSCOPY Bilateral 2004   LUMBAR DISC SURGERY  1999   RADICULOPATHY, CERVICAL REGION     TONSILLECTOMY      There were no vitals filed for this visit.   Subjective Assessment - 05/19/22 1700     Subjective  I had 2 falls again since of seeing you last time while getting into bed and the other 1 in my workshop.  My right knee just gave out.  But look I can make a grip with no pain but here down at the wrist pain with bending it    Pertinent History  Note from Dr Virgel Paling- Left hand aneurysm.    Previous History:  02/09/2022: Per  [name], MD on 02/07/2022: "Mr. Cory Espinoza is a 51 y.o. 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 The patient has since developed a pulsating mass in the left palm which intermittently bleeds with a pulsating pattern. He was recently seen in the [facility] Room on 02/07/2022 where they had difficulty getting the bleeding under control. He was transferred to the [facility] where the bleeding was under control and he was sent home. He is here for routine follow-up. He has previously been seen as an outpatient by Dr. Deeann Saint for this issue who ultimately had elected to proceed with nonoperative management. That visit was on 01/29/2022.     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 At his last visit, he was sent for an angiogram. He is here to discuss the results.    Today, the patient reports his aneurysm is bleeding. He stopped taking anticoagulant medication Plavix prescribed by his former cardiologist. He is very interested in moving forward with surgery.On 03/25/22 seen last time by surgeon - Left carpal tunnel release, left hand, distal vessel aneurysm excision and repair of the vessel by Dr. Doran Durand 3/29/2023When I saw the patient last on 03/18/2022, [name]    Patient Stated Goals My left hand is my strong hand and need to use my walking stick to prevent me to fall.  As well as to work in my shop doing woodwork I use mostly my left hand I am very dependent on my left hand    Currently in Pain? Yes     Pain Score 3     Pain Location Wrist    Pain Orientation Left    Pain Descriptors / Indicators Tender;Tightness    Pain Type Surgical pain    Pain Onset More than a month ago    Aggravating Factors  Over carpal tunnel as well as dorsal wrist with endrange wrist flexion extension                [facility] OT Assessment - 05/19/22 0001       AROM   Left Wrist Extension 70 Degrees    Left Wrist Flexion 90 Degrees      Strength   Right Hand Grip (lbs)  99    Right Hand Lateral Pinch 33 lbs    Right Hand 3 Point Pinch 27 lbs    Left Hand Grip (lbs) 72    Left Hand Lateral Pinch 26 lbs    Left Hand 3 Point Pinch 20 lbs                 Patient this date with increased grip and prehension strength three-point in left hand compared in the past.  Patient reportedly is a good day today with his migraines.  Patient able to maintain composite grip without collapsing into flexion of the wrist and pain-free.  Patient able to use his walking stick to pull up and push-up with the right wrist with pain less than a 2/10. Reviewed with patient wall push-ups standing closer to the wall not locking his knees and using Benik neoprene splint on the left or towel for cushioning over the carpal tunnel. Patient report he had 2 more falls since last seen left wrist pain over dorsal and volar wrist with wrist flexion extension.       OT Treatments/Exercises (OP) - 05/19/22 0001       LUE Fluidotherapy   Number Minutes Fluidotherapy 12 Minutes    LUE Fluidotherapy Location Hand;Wrist    Comments To ice rotations in between decreased pain with wrist flexion extension               Scar massage done on left hand using mini massager as well as extractor scar tissue improving greatly tenderness and pain improving.  Patient able to make a composite fist now without wrist flexion and pain.  Opposition still limited to side of fifth because of scar tissue.     Patient do report using Thera-Band  for right upper extremity strengthening assess patient's elbow extension of flexion as well as wrist and forearm in all planes.  Wrist improved greatly since few sessions ago with 5 out of 5 strength for wrist and forearm right side.  Triceps to continue to being weak for 4-5.  Assess green band and red band for the right tricep.  Patient doing better with the double red band for tricep 2 sets of 8 pain-free with moderate resistance.                 Patient do says show improvement in scar tissue as well as pain and tenderness in left hand.  Less pain with composite fist as well as not collapsing into wrist flexion.       OT Education - 05/19/22 1701     Education Details Home program changes as well as nerve healing    Person(s) Educated Patient;Spouse    Methods Explanation;Demonstration;Tactile cues;Verbal cues;Handout    Comprehension Verbal cues required;Returned demonstration;Verbalized understanding              OT Short Term Goals - 03/31/22 1808       OT SHORT TERM GOAL #1   Title Patient to be independent in home program to decrease edema, decreased pain as well as scar tissue to increase digit flexion to touching palm.    Baseline Patient has no knowledge of home program to decrease scar tissue, decrease pain and edema pain t2-10/10 with active range of motion, increase edema as well as scar tissue in palm with 1 inch x 1 inch area that still healing with scab.  Patient hypersensitive to massage and rougher textures.  MC flexion is 55-70 degrees, PIPs 60 degrees - 90 degrees.  Time 3    Period Weeks    Status New    Target Date 04/21/22               OT Long Term Goals - 03/31/22 1810       OT LONG TERM GOAL #1   Title Patient's left hand scar tissue improve for patient to show increase active range of motion as well as tolerating different textures, tapping and gripping tools of different textures in ADLs and IADLs    Baseline Scar is Z scar thick,  adhere, 1 inch x 1 inch area of the still healing with scab-patient hypersensitive can tolerate light massage and touch but not scar tissue massage and rougher textures.    Time 5    Period Weeks    Status New    Target Date 05/05/22      OT LONG TERM GOAL #2   Title Patient left hand digits flexion improve touching palm to be able to use walking stick while maintaining full extension with pain less than 2/10    Baseline MC flexion in digits 55-70 degrees, PIPs 60 degrees - 90 degrees, unable to use walking stick or gripping tools because of scar sensitivity and tightness as well as stiffness in hand.    Time 6    Period Weeks    Status New    Target Date 05/12/22      OT LONG TERM GOAL #3   Title Left wrist active range of motion improved to within normal limits for patient to push up from chair as well as use hand in light would work    Baseline Wrist extension 30 degrees with pain, flexion 62 degrees on left hand.  Limited by scar tissue.  Not able to use hand    Time 8    Period Weeks    Status New    Target Date 05/26/22      OT LONG TERM GOAL #4   Title Patient's left hand grip strength and prehension increased to within functional limits for his age to be able to use hand and use of walking stick, holding a plate and starting to use and woodwork.    Baseline Patient is tomorrow 4 weeks postop, scar thick and adherent and tender to massage and rougher textures, grip strength not tested yet unable to touch palm, increase stiffness in MCPs and PIPs flexion.    Time 8    Period Weeks    Status New    Target Date 05/26/22                   Plan - 05/19/22 1702     Clinical Impression Statement Patient present at OT evaluation with a diagnosis of postop left carpal tunnel release with repair of arterial vessel and excision of aneurysm.  Surgery date was 03/04/2022.  Patient is now about 10  weeks postop.  Patient made great progress from start of care but continued to have  some thick scar tissue in the palm as well as having some falls because of migraines and catching himself with that hand causing increased inflammation and tenderness carpal tunnel and dorsal wrist.  Patient arrived this date again after having 2 falls.  Patient able to do composite grip without collapsing into flexion and pain-free grip.  But had 3 out of 10 tenderness over carpal tunnel as well as DRUJ on dorsal side of wrist. Reinforced again with patient importance of contrast after he injured it or  fall on his hand to decrease inflammation to be able to do scar tissue and increase active range of motion and progressing in strengthening /use of L hand /wrist .  Patient scar tissue is improving as well as wrist motion.  Assessment right wrist strength 5/5 this date.  Reviewed with patient again elbow extension more than elbow flexion exercises using double red Thera-Band instead of green.  Also reviewed with patient the wall push-ups with a Benik neoprene splint on left wrist and doing only 2 sets of 6 with not standing too far from wall.  Assess this date again grip strength that improved improved bilaterally as well as three-point pinch.  Patient to report he has a good day with his migraines and that affects also grip strength and prehension.  Patient continues to be limited in scar tissue and pain and tenderness over wrist dorsal and volarly.   Patient decrease in opposition to 5th and fine motor coordination but improving..  As well as decreased strength because of scar tissue, stiffness and pain limiting his functional use of left hand in ADLs and IADLs.  Patient can benefit from skilled OT services. Add this date to plan R UE strenghning to increase his independeince in IADLS and prvent injuries.    OT Occupational Profile and History Problem Focused Assessment - Including review of records relating to presenting problem    Occupational performance deficits (Please refer to evaluation for details):  ADL's;IADL's;Play;Leisure;Social Participation    Body Structure / Function / Physical Skills ADL;Strength;Pain;Dexterity;Edema;UE functional use;ROM;IADL;Scar mobility;Flexibility;Sensation;Skin integrity;Decreased knowledge of precautions    Rehab Potential Good    Clinical Decision Making Several treatment options, min-mod task modification necessary    Comorbidities Affecting Occupational Performance: None    Modification or Assistance to Complete Evaluation  No modification of tasks or assist necessary to complete eval    OT Frequency 2x / week    OT Duration 8 weeks    OT Treatment/Interventions Self-care/ADL training;Moist Heat;Fluidtherapy;DME and/or AE instruction;Contrast Bath;Therapeutic activities;Scar mobilization;Therapeutic exercise;Paraffin;Manual Therapy;Patient/family education;Passive range of motion    Consulted and Agree with Plan of Care Patient;Family member/caregiver             Patient will benefit from skilled therapeutic intervention in order to improve the following deficits and impairments:   Body Structure / Function / Physical Skills: ADL, Strength, Pain, Dexterity, Edema, UE functional use, ROM, IADL, Scar mobility, Flexibility, Sensation, Skin integrity, Decreased knowledge of precautions       Visit Diagnosis: Muscle weakness (generalized)  Stiffness of left hand, not elsewhere classified  Scar tissue  Pain in left hand  Stiffness of left wrist, not elsewhere classified    Problem List Patient Active Problem List   Diagnosis Date Noted   [name] (coronary artery disease) 10/21/2021   Degenerative spondylolisthesis 03/25/2020   Physical deconditioning 03/04/2020   Lobar pneumonia, unspecified organism (HCC) 03/04/2020   Intractable hemiplegic migraine with status migrainosus 03/02/2020   Current tobacco use 02/05/2020   Abnormal findings on diagnostic imaging of lung 02/05/2020   Shortness of breath 02/05/2020   Herniated disc, cervical  01/23/2020   Cervical spondylosis with myelopathy and radiculopathy 01/23/2020   Pre-operative respiratory examination 01/12/2020   Weakness on right side of face 12/22/2019   Osteoarthritis of spine with radiculopathy, lumbar region 12/11/2019   Intractable migraine with aura with status migrainosus 11/09/2019   Former smoker 08/27/2018   Benign essential HTN 08/27/2018   Cough productive of purulent sputum 11/08/2017   GERD (gastroesophageal reflux disease)  05/06/2017   Contusion of hand 04/22/2017   BP (high blood pressure) 04/22/2017   Oxygen desaturation 04/22/2017   Prostate lump 04/22/2017   Unstable angina (HCC) 04/27/2016   Chest pain 04/24/2016   Morbid obesity (HCC) 01/29/2016   Elevated ALT measurement 10/30/2015   Nonspecific elevation of levels of transaminase and lactic acid dehydrogenase (ldh) 10/30/2015   Reduced libido 10/29/2015   Hyperlipidemia 08/06/2015   Anxiety 08/06/2015   Atherosclerosis of coronary artery 08/06/2015   Childhood asthma 08/06/2015   Chronic obstructive pulmonary disease (HCC) 08/06/2015   OSA (obstructive sleep apnea) 08/06/2015   Anxiety disorder 08/06/2015   Atherosclerosis of native coronary artery of native heart with stable angina pectoris (HCC) 08/06/2015   Uncomplicated asthma 08/06/2015    Oletta Cohn, OTR/L,CLT 05/19/2022, 5:09 PM  [facility] [address] [place], [address] Phone: [phone]   Fax:  [phone]  Name: GIANPAOLO MINDEL MRN: [id number] Date of Birth: 12/18/1970

## 2022-05-21 ENCOUNTER — Ambulatory Visit: Payer: BC Managed Care – PPO | Admitting: Occupational Therapy

## 2022-05-21 ENCOUNTER — Ambulatory Visit: Payer: BC Managed Care – PPO

## 2022-05-26 ENCOUNTER — Ambulatory Visit: Payer: BC Managed Care – PPO

## 2022-05-26 ENCOUNTER — Ambulatory Visit: Payer: BC Managed Care – PPO | Admitting: Occupational Therapy

## 2022-05-28 ENCOUNTER — Ambulatory Visit: Payer: BC Managed Care – PPO

## 2022-05-28 ENCOUNTER — Ambulatory Visit: Payer: BC Managed Care – PPO | Admitting: Occupational Therapy

## 2022-06-02 ENCOUNTER — Ambulatory Visit: Payer: BC Managed Care – PPO

## 2022-06-04 ENCOUNTER — Ambulatory Visit: Payer: BC Managed Care – PPO

## 2022-06-04 ENCOUNTER — Ambulatory Visit: Payer: BC Managed Care – PPO | Admitting: Occupational Therapy

## 2022-06-11 ENCOUNTER — Ambulatory Visit: Payer: BC Managed Care – PPO

## 2022-06-16 ENCOUNTER — Ambulatory Visit: Payer: BC Managed Care – PPO | Attending: Orthopedic Surgery

## 2022-06-16 DIAGNOSIS — R42 Dizziness and giddiness: Secondary | ICD-10-CM | POA: Insufficient documentation

## 2022-06-16 DIAGNOSIS — R2681 Unsteadiness on feet: Secondary | ICD-10-CM | POA: Insufficient documentation

## 2022-06-16 DIAGNOSIS — R208 Other disturbances of skin sensation: Secondary | ICD-10-CM | POA: Insufficient documentation

## 2022-06-16 DIAGNOSIS — M6281 Muscle weakness (generalized): Secondary | ICD-10-CM | POA: Diagnosis present

## 2022-06-16 DIAGNOSIS — R262 Difficulty in walking, not elsewhere classified: Secondary | ICD-10-CM | POA: Insufficient documentation

## 2022-06-16 NOTE — Therapy (Signed)
OUTPATIENT PHYSICAL THERAPY TREATMENT NOTE   Patient Name: Cory Espinoza MRN: [id number] DOB:1971/01/23, 51 y.o., male Today's Date: 06/16/2022  PCP: [place] Pink MD REFERRING PROVIDER: Jennings Books MD    PT End of Session - 06/16/22 1514     Visit Number 45    Number of Visits 51    Date for PT Re-Evaluation 08/04/22    Authorization Type [employer] PPO;    Authorization Time Period 8/10 PN 4/20    PT Start Time 1515    PT Stop Time 1559    PT Time Calculation (min) 44 min    Equipment Utilized During Treatment Gait belt    Activity Tolerance Patient tolerated treatment well;Treatment limited secondary to medical complications (Comment)   provoked symptoms with certain activities   Behavior During Therapy WFL for tasks assessed/performed             Past Medical History:  Diagnosis Date   Allergy    Anginal pain (HCC)    Anxiety    Arthritis    lumbar spine   Asthma    Atrial fibrillation (HCC)    Back pain    Severe Lumbar Pain   CHF (congestive heart failure) (HCC)    COPD (chronic obstructive pulmonary disease) (HCC)    Restrictive lung disease   Coronary artery disease    Dyspnea    Dysrhythmia    afib   GERD (gastroesophageal reflux disease)    Headache    [name], onset October 2020, cluster headaches in the past   History of kidney stones    Hyperlipidemia    Hypertension    Myocardial infarction ([place])    at age 26   Pneumonia    Restrictive lung disease    Sleep apnea    unable to use cpap since onset of migraines   Past Surgical History:  Procedure Laterality Date   ABDOMINAL EXPOSURE N/A 03/25/2020   Procedure: ABDOMINAL EXPOSURE;  Surgeon: Rosetta Posner, MD;  Location: [facility];  Service: Vascular;  Laterality: N/A;   ANTERIOR CERVICAL DECOMP/DISCECTOMY FUSION N/A 01/23/2020   Procedure: ANTERIOR CERVICAL DECOMPRESSION FUSION - CERVICAL SIX-CERVICAL SEVEN;  Surgeon: Earnie Larsson, MD;  Location: [facility];  Service: Neurosurgery;  Laterality:  N/A;   ANTERIOR LUMBAR FUSION N/A 03/25/2020   Procedure: ANTERIOR LUMBAR INTERBODY FUSION LUMBAR FIVE-SACRAL ONE.;  Surgeon: Earnie Larsson, MD;  Location: [facility];  Service: Neurosurgery;  Laterality: N/A;  anterior   BACK SURGERY  1996   CARDIAC CATHETERIZATION Left 04/27/2016   Procedure: Left Heart Cath and Coronary Angiography;  Surgeon: Corey Skains, MD;  Location: [facility];  Service: Cardiovascular;  Laterality: Left;   CARDIAC CATHETERIZATION N/A 04/27/2016   Procedure: Intravascular Pressure Wire/FFR Study;  Surgeon: Yolonda Kida, MD;  Location: [facility];  Service: Cardiovascular;  Laterality: N/A;   CATHETERIZATION OF PULMONARY ARTERY WITH RETRIEVAL OF FOREIGN BODY Bilateral 04/07/2011   heart   DEGENERATIVE SPONDYLOLISTHESIS     KNEE ARTHROSCOPY Bilateral 2004   LUMBAR DISC SURGERY  1999   RADICULOPATHY, CERVICAL REGION     TONSILLECTOMY     Patient Active Problem List   Diagnosis Date Noted   [name] (coronary artery disease) 10/21/2021   Degenerative spondylolisthesis 03/25/2020   Physical deconditioning 03/04/2020   Lobar pneumonia, unspecified organism ([place]) 03/04/2020   Intractable hemiplegic migraine with status migrainosus 03/02/2020   Current tobacco use 02/05/2020   Abnormal findings on diagnostic imaging of lung 02/05/2020  Shortness of breath 02/05/2020   Herniated disc, cervical 01/23/2020   Cervical spondylosis with myelopathy and radiculopathy 01/23/2020   Pre-operative respiratory examination 01/12/2020   Weakness on right side of face 12/22/2019   Osteoarthritis of spine with radiculopathy, lumbar region 12/11/2019   Intractable migraine with aura with status migrainosus 11/09/2019   Former smoker 08/27/2018   Benign essential HTN 08/27/2018   Cough productive of purulent sputum 11/08/2017   GERD (gastroesophageal reflux disease) 05/06/2017   Contusion of hand 04/22/2017   BP (high blood pressure) 04/22/2017   Oxygen desaturation  04/22/2017   Prostate lump 04/22/2017   Unstable angina ([place]) 04/27/2016   Chest pain 04/24/2016   Morbid obesity ([place]) 01/29/2016   Elevated ALT measurement 10/30/2015   Nonspecific elevation of levels of transaminase and lactic acid dehydrogenase (ldh) 10/30/2015   Reduced libido 10/29/2015   Hyperlipidemia 08/06/2015   Anxiety 08/06/2015   Atherosclerosis of coronary artery 08/06/2015   Childhood asthma 08/06/2015   Chronic obstructive pulmonary disease ([place]) 08/06/2015   OSA (obstructive sleep apnea) 08/06/2015   Anxiety disorder 08/06/2015   Atherosclerosis of native coronary artery of native heart with stable angina pectoris ([place]) 78/93/8101   Uncomplicated asthma 75/09/2584    REFERRING DIAG: BPPV/unsteadiness  THERAPY DIAG:  Muscle weakness (generalized)  Unsteadiness on feet  Difficulty in walking, not elsewhere classified  Dizziness and giddiness  Other disturbances of skin sensation  Rationale for Evaluation and Treatment Rehabilitation  PERTINENT HISTORY: Pt has a complex medical history. He reports that he fell during a [employer] training in [place] in the fall of 2020. He states that he fell 10-12 feet and struck his head, neck, and back. He was able to finish the course which took him approximately 45 minutes more to complete. He does endorse a second fall while finishing the course but did not suffer a second head injury. He states that he started getting headaches that day which have persisted since that time. 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 He does report improvement in his headaches recently. Neurology was concerned about possible BPPV and have referred him for a vestibular evaluation. In addition since the injury, pt developed RUE and RLE pain and weakness. Pt states that NCV showed abnormal nerve conduction in RUE. He has since undergone a C7-7 ACDF on 01/23/20. He reports initial  improvement in RUE strength, but states that he has a gradual return of his RUE weakness. He was also having significant RLE weakness and pain and underwent and L5-S1 anterior lumbar interbody fusion on 03/25/20. Patient reports that he does not have any back precautions but needs to wear the low back brace for one more month. Patient reports he has a follow-up appointment with the surgeon next month. Patient reports he has a follow-up appointment with Dr. Brigitte Pulse, neurologist, in August. He arrives to therapy ambulating with an upright rollator walker. Pt denies any mention of concussion after his injury. He has been having cognitive issues since his injury and has previously had a heavy metals screen as well as neurocognitive testing. He has had an MRI of his brain with and without contrast on 12/18/19. Results were mildly motion degraded examination. No evidence of acute intracranial abnormality. Minimal chronic small vessel ischemic disease. He has tremor in both his hands. He has a positive family history of Parkinson's disease in his grandfather. He is complaining of constant dizziness and unsteadiness which are worse with  activity. Patient states that he frequently loses his balance and his wife has to steady him.  PRECAUTIONS: Falls  SUBJECTIVE: Patient is returning after month absences due to multiple bad weeks s/p infusion.   PAIN:  Are you having pain? Yes: NPRS scale: 6/10 Pain location: head Pain description: migraine Aggravating factors: turning head, weather Relieving factors: medicine, dark room     TODAY'S TREATMENT:  Seated: 5lb ankle weights on LE's: -seated LAQ 15x each LE -march 15x each LE -ER/IR 15x each LE -heel raises 15x each LE Seated with 5lb ankle weights on wrists: -bicep curl 10x each UE -alternating punches 10 each UE Seated with GTB: -Hamstring curls 15x each LE -abduction BLE 15x each LE Seated with rainbow ball: -LAQ with adduction ball squeeze  10x -adduction ball squeeze 15x -chest press squeeze 15x  Ambulate 600 ft with walking stick and close CGA.    PATIENT EDUCATION: Education details: exercise technique, body mechanics Person educated: Patient Education method: Explanation, Demonstration, Tactile cues, and Verbal cues Education comprehension: verbalized understanding, returned demonstration, verbal cues required, and tactile cues required   HOME EXERCISE PROGRAM: See prior session    PT Short Term Goals       PT SHORT TERM GOAL #1   Title Patient will be independent in home exercise program to improve strength/mobility for better functional independence with ADLs and for self-management.    Baseline 3/3 HEP compliant 3/31 hep compliant    Time 6    Period Weeks    Status Achieved    Target Date 02/20/21      PT SHORT TERM GOAL #2   Title Patient will be modified independent in walking on even/uneven surface with least restrictive assistive device, for 10+ minutes without rest break, reporting some difficulty or less to improve walking tolerance with community ambulation including grocery shopping, going to church,etc.    Baseline 02/03: instability, LOB multiple times while ambulating to elevator 3/31: unable to test due to throwing up 6/20: 6 minutes 7/12: unable to test due to migraine 9/12: defer to next session 11/15: unable to test due to migraine 3/2: requires heavy use of AD and seated rest breaks 4/20: walked around yard yesterday    Time 6    Period Weeks    Status Partially Met    Target Date 03/19/22      PT SHORT TERM GOAL #3   Title Patient will increase ABC scale score >80% to demonstrate better functional mobility and better confidence with ADLs.    Baseline 01/09/21: 60.625% 3/3/: 50% 3/31: 63% 6/20: 45% 7/25: 40% 9/12: 51.1% 11/15: 48% 1/17: 40% 3/2: 33% 4/20: 39% 6/6: 51%    Time 6    Period Weeks    Status Partially Met    Target Date 06/23/22              PT Long Term Goals       PT LONG TERM GOAL #1   Title Patient will increase FOTO score to equal to or greater than 65 to demonstrate statistically significant improvement in mobility and quality of life.    Baseline scored 41/100 on 05/21/2020; 7/22 40, 8/23: 60/100, 09/30/20=40, 01/09/21: 46 3/3: 40.3% 3/31: 49% 6/20: 45% 7/12: 50.7% 9/12: 44.7% 11/15: 40% 1/17: 40% 3/2: 42%    Time 12    Period Weeks    Status Not Met    Target Date 04/30/22      PT LONG TERM GOAL #2   Title Patient will  reduce falls risk as indicated by decreased TUG time to less than 11 seconds.    Baseline scored 37.9 sec with TUG on 05/21/2020; 21seconds with elevated rollator 7/22, 8/23: 13.62 sec with up and go walker, 01/09/21: 15.81s with hiking stick 3/3: 17.98 seconds one near LOB 3/31: 15.57 seconds with walking stick 6/20: 14.8 seconds with walking stick. 7/25: 19.72 seconds with walking stick. 9/26: 17.6 seconds 11/15: unable to test due to migraine 1/17: deferred due to pain 3/2:19.25 seconds with walking stick in involved hand 4/20: deferred due to migraine and back pain 6/6: 10.5 seconds with walking stick    Time 12    Period Weeks    Status Achieved    Target Date 04/30/22      PT LONG TERM GOAL #3   Title Patient will increase right UE and LE gross strength to 4+/5 throughout to improve functional strength for independent gait, increased standing tolerance and increased ADL ability.    Baseline grossly +3/5 to -4/5 right UE and LE strength on 05/21/2020; grossly 4-/5 for RUE, grossly 4-/5 for RLE on 7/22, grossly 4-/5 RUE, grossly 4/5 RLE 3/3: see note 3/2: see note 4/20: RLE 4-/5 with quad 4/5 RUE 4/5 with bicep 3+/5 6/6: see note:    Time 12    Period Weeks    Status Partially Met    Target Date 08/04/22      PT LONG TERM GOAL #4   Title Patient will increase 10 meter walk test to >1.31ms as to improve gait speed for better community ambulation and to reduce fall risk.    Baseline on 7/22 .57 m/sself selected, fast  .73 m/s  with LOB pt reported R knee buckling, elevated rollator used, 8/23: 1.06 m/s with up and go walker, 01/09/21: 0.72 m/s with hiking stick 3/3: 0.74 m/s with walking stick 3/31: 0.81 m/s w walking stick 6/20: 1.1 m/s with walking stick    Time 12    Period Weeks    Status Achieved      PT LONG TERM GOAL #5   Title Patient will increase Berg Balance score by > 6 points to demonstrate decreased fall risk during functional activities.    Baseline 8/23: 37/56, 09/30/20=40/56, 12/20: 42/56 3/3: deferred 3/31 will perform next time; deferred due to throwing up 6/20: 46/56 7/12 38/56    Time 8    Period Weeks    Status Achieved      Additional Long Term Goals   Additional Long Term Goals Yes      PT LONG TERM GOAL #6   Title Patient (< 644years old) will complete five times sit to stand test in < 10 seconds indicating an increased LE strength and improved balance.    Baseline 8/23: 22 sec, 09/30/20=13.04 sec, 12/20: 27.05 sec, 01/09/21: 18.01s 3/3: 23 seconds no UE support 3/31; deferred due to patient throwing up 7/12: 15.2 seconds 11/15: 26 seconds 1/17: deferred due to pain 3/2: 17.5 seconds 4/20: deferred due to migraine and back pain 6/6: 11 seconds with SUE support    Time 12    Period Weeks    Status Partially Met    Target Date 08/04/22      PT LONG TERM GOAL #7   Title Patient will improve 6 min walk test >1000 feet with LRAD for improved gait ability in community.    Baseline 8/23: not tested; 8/30 845, goal adjusted >20023f 09/30/20=600 feet - stopped test due to R ankle catching making gait  unsafe, 11/25/20= 330 feet - stopped test due to severe dizziness, vomiting/nausea, sweating making gait unsafe; 01/09/21: 720 ft with no rest breaks 3/3: deferred due to nausea 3/31: deferred due to patient throwing up 6/20: 640 ft 7/12: 659f with walking stick, no rest breaks; 9/26: 1000 ft    Time 12    Period Weeks    Status Achieved      PT LONG TERM GOAL #8   Title Patient will increase  Berg Balance score by > 6 points  (52/56) to demonstrate decreased fall risk during functional activities.    Baseline 6/20: 46/56 7/12: 38/56 with migraine 9/12: defer to next session 11/15:  unable to test due to migraine 1/17: deferred due to pain 3/2: next session 4/20: deferred due to migraine and back pain    Time 12    Period Weeks    Status Partially Met    Target Date 08/04/22      PT LONG TERM GOAL  #9   TITLE Patient will report decreased frequency of falling per week to 1x/week for decreased fall risk and improved quality of life    Baseline 12/1: 4 falls minimum a week 1/17: 1 fall last week 3/2: has been bed bound the week prior 4/20: 2-3x/week 6/6: 2x/week    Time 12    Period Weeks    Status Partially Met    Target Date 08/04/22      PT LONG TERM GOAL  #10   TITLE Patient will turn head 75% of the time without dizziness or LOB.    Baseline 6/6: patient falls and has extreme dizziness resulting in throwing up.    Time 12    Period Weeks    Status New    Target Date 08/04/22      PT LONG TERM GOAL  #11   TITLE Patient will report a worst pain of 4/10 on VAS in cervical and lumbar  to improve tolerance with ADLs and reduced symptoms with activities.    Baseline 6/6: lumbar: 10/10, cervical 8/10    Time 12    Period Weeks    Status New    Target Date 08/04/22              Plan     Clinical Impression Statement Patient returning to PT with excellent motivation. He has progressively worsening migraine by end of session with increased trembling of hands, sweating, and pain. Patient tolerates progressive strengthening with higher weights this session. Patient will benefit from therapy to reduce fall risk, improve strength, and improve capacity for functional mobility for improved quality of life    Personal Factors and Comorbidities Comorbidity 3+;Time since onset of injury/illness/exacerbation    Comorbidities anxiety, back pain, COPD, headache, HTN, MI, sleep apnea,  migraines    Examination-Activity Limitations Locomotion Level;Transfers;Bed Mobility;Stand;Stairs;Sleep;Squat;Bend    Examination-Participation Restrictions Driving;Medication Management    Stability/Clinical Decision Making Unstable/Unpredictable    Rehab Potential Fair    PT Frequency 1x / week    PT Duration 12 weeks    PT Treatment/Interventions ADLs/Self Care Home Management;Canalith Repostioning;Moist Heat;Electrical Stimulation;Gait training;Stair training;Functional mobility training;Therapeutic activities;Therapeutic exercise;Balance training;Neuromuscular re-education;Patient/family education;Manual techniques;Passive range of motion;Vestibular    PT Next Visit Plan Continue with functional mobility training and strengthening;    PT Home Exercise Plan Medbridge Access Code: [email or web address]    Consulted and Agree with Plan of Care Patient;Family member/caregiver    Family Member Consulted Wife  Janna Arch, PT 06/16/2022, 4:14 PM

## 2022-06-18 ENCOUNTER — Ambulatory Visit: Payer: BC Managed Care – PPO

## 2022-06-22 NOTE — Therapy (Signed)
OUTPATIENT PHYSICAL THERAPY TREATMENT NOTE   Patient Name: Cory Espinoza MRN: [id number] DOB:04-25-1971, 51 y.o., male Today's Date: 06/23/2022  PCP: [place] Pink MD REFERRING PROVIDER: Jennings Books MD    PT End of Session - 06/23/22 1504     Visit Number 74    Number of Visits 23    Date for PT Re-Evaluation 08/04/22    Authorization Type [employer] PPO;    Authorization Time Period 9/10 PN 4/20    PT Start Time 1515    PT Stop Time 1559    PT Time Calculation (min) 44 min    Equipment Utilized During Treatment Gait belt    Activity Tolerance Patient tolerated treatment well;Treatment limited secondary to medical complications (Comment)   provoked symptoms with certain activities   Behavior During Therapy WFL for tasks assessed/performed              Past Medical History:  Diagnosis Date   Allergy    Anginal pain (HCC)    Anxiety    Arthritis    lumbar spine   Asthma    Atrial fibrillation (HCC)    Back pain    Severe Lumbar Pain   CHF (congestive heart failure) (HCC)    COPD (chronic obstructive pulmonary disease) (HCC)    Restrictive lung disease   Coronary artery disease    Dyspnea    Dysrhythmia    afib   GERD (gastroesophageal reflux disease)    Headache    [name], onset October 2020, cluster headaches in the past   History of kidney stones    Hyperlipidemia    Hypertension    Myocardial infarction ([place])    at age 75   Pneumonia    Restrictive lung disease    Sleep apnea    unable to use cpap since onset of migraines   Past Surgical History:  Procedure Laterality Date   ABDOMINAL EXPOSURE N/A 03/25/2020   Procedure: ABDOMINAL EXPOSURE;  Surgeon: Rosetta Posner, MD;  Location: [facility] OR;  Service: Vascular;  Laterality: N/A;   ANTERIOR CERVICAL DECOMP/DISCECTOMY FUSION N/A 01/23/2020   Procedure: ANTERIOR CERVICAL DECOMPRESSION FUSION - CERVICAL SIX-CERVICAL SEVEN;  Surgeon: Earnie Larsson, MD;  Location: [place];  Service: Neurosurgery;   Laterality: N/A;   ANTERIOR LUMBAR FUSION N/A 03/25/2020   Procedure: ANTERIOR LUMBAR INTERBODY FUSION LUMBAR FIVE-SACRAL ONE.;  Surgeon: Earnie Larsson, MD;  Location: [place];  Service: Neurosurgery;  Laterality: N/A;  anterior   BACK SURGERY  1996   CARDIAC CATHETERIZATION Left 04/27/2016   Procedure: Left Heart Cath and Coronary Angiography;  Surgeon: Corey Skains, MD;  Location: [place] [facility];  Service: Cardiovascular;  Laterality: Left;   CARDIAC CATHETERIZATION N/A 04/27/2016   Procedure: Intravascular Pressure Wire/FFR Study;  Surgeon: Yolonda Kida, MD;  Location: [place] [facility];  Service: Cardiovascular;  Laterality: N/A;   CATHETERIZATION OF PULMONARY ARTERY WITH RETRIEVAL OF FOREIGN BODY Bilateral 04/07/2011   heart   DEGENERATIVE SPONDYLOLISTHESIS     KNEE ARTHROSCOPY Bilateral 2004   LUMBAR DISC SURGERY  1999   RADICULOPATHY, CERVICAL REGION     TONSILLECTOMY     Patient Active Problem List   Diagnosis Date Noted   [name] (coronary artery disease) 10/21/2021   Degenerative spondylolisthesis 03/25/2020   Physical deconditioning 03/04/2020   Lobar pneumonia, unspecified organism ([place]) 03/04/2020   Intractable hemiplegic migraine with status migrainosus 03/02/2020   Current tobacco use 02/05/2020   Abnormal findings on diagnostic imaging of lung 02/05/2020  Shortness of breath 02/05/2020   Herniated disc, cervical 01/23/2020   Cervical spondylosis with myelopathy and radiculopathy 01/23/2020   Pre-operative respiratory examination 01/12/2020   Weakness on right side of face 12/22/2019   Osteoarthritis of spine with radiculopathy, lumbar region 12/11/2019   Intractable migraine with aura with status migrainosus 11/09/2019   Former smoker 08/27/2018   Benign essential HTN 08/27/2018   Cough productive of purulent sputum 11/08/2017   GERD (gastroesophageal reflux disease) 05/06/2017   Contusion of hand 04/22/2017   BP (high blood pressure) 04/22/2017   Oxygen  desaturation 04/22/2017   Prostate lump 04/22/2017   Unstable angina ([place]) 04/27/2016   Chest pain 04/24/2016   Morbid obesity ([place]) 01/29/2016   Elevated ALT measurement 10/30/2015   Nonspecific elevation of levels of transaminase and lactic acid dehydrogenase (ldh) 10/30/2015   Reduced libido 10/29/2015   Hyperlipidemia 08/06/2015   Anxiety 08/06/2015   Atherosclerosis of coronary artery 08/06/2015   Childhood asthma 08/06/2015   Chronic obstructive pulmonary disease ([place]) 08/06/2015   OSA (obstructive sleep apnea) 08/06/2015   Anxiety disorder 08/06/2015   Atherosclerosis of native coronary artery of native heart with stable angina pectoris ([place]) 08/65/7846   Uncomplicated asthma 96/29/5284    REFERRING DIAG: BPPV/unsteadiness  THERAPY DIAG:  Muscle weakness (generalized)  Unsteadiness on feet  Difficulty in walking, not elsewhere classified  Rationale for Evaluation and Treatment Rehabilitation  PERTINENT HISTORY: Pt has a complex medical history. 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 He states that he fell 10-12 feet and struck his head, neck, and back. He was able to finish the course which took him approximately 45 minutes more to complete. He does endorse a second fall while finishing the course but did not suffer a second head injury. He states that he started getting headaches that day which have persisted since that time. 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 He does report improvement in his headaches recently. Neurology was concerned about possible BPPV and have referred him for a vestibular evaluation. In addition since the injury, pt developed RUE and RLE pain and weakness. Pt states that NCV showed abnormal nerve conduction in RUE. He has since undergone a C7-7 ACDF on 01/23/20. He reports initial improvement in RUE strength, but states that he has a  gradual return of his RUE weakness. He was also having significant RLE weakness and pain and underwent and L5-S1 anterior lumbar interbody fusion on 03/25/20. Patient reports that he does not have any back precautions but needs to wear the low back brace for one more month. Patient reports he has a follow-up appointment with the surgeon next month. Patient reports he has a follow-up appointment with Dr. Brigitte Pulse, neurologist, in August. He arrives to therapy ambulating with an upright rollator walker. Pt denies any mention of concussion after his injury. He has been having cognitive issues since his injury and has previously had a heavy metals screen as well as neurocognitive testing. He has had an MRI of his brain with and without contrast on 12/18/19. Results were mildly motion degraded examination. No evidence of acute intracranial abnormality. Minimal chronic small vessel ischemic disease. He has tremor in both his hands. He has a positive family history of Parkinson's disease in his grandfather. He is complaining of constant dizziness and unsteadiness which are worse with activity. Patient states that he frequently loses his balance and  his wife has to steady him.  PRECAUTIONS: Falls  SUBJECTIVE: Patient presents with wife. Was able to be in the pool this weekend for 3 hours.   PAIN:  Are you having pain? Yes: NPRS scale: 4/10 Pain location: head Pain description: migraine Aggravating factors: turning head, weather Relieving factors: medicine, dark room     TODAY'S TREATMENT:  Seated: 5lb ankle weights on LE's: -seated LAQ 15x each LE -march 15x each LE -ER/IR 15x each LE -heel raises 15x each LE Seated with 5lb ankle weights on wrists: -bicep curl 10x each UE -alternating punches 10 each UE Seated with GTB: -Hamstring curls 15x each LE -abduction BLE 15x each LE Seated with rainbow ball: -LAQ with adduction ball squeeze 10x -adduction ball squeeze 15x -chest press squeeze  15x  Ambulate 600 ft with walking stick and close CGA.    PATIENT EDUCATION: Education details: exercise technique, body mechanics Person educated: Patient Education method: Explanation, Demonstration, Tactile cues, and Verbal cues Education comprehension: verbalized understanding, returned demonstration, verbal cues required, and tactile cues required   HOME EXERCISE PROGRAM: See prior session    PT Short Term Goals       PT SHORT TERM GOAL #1   Title Patient will be independent in home exercise program to improve strength/mobility for better functional independence with ADLs and for self-management.    Baseline 3/3 HEP compliant 3/31 hep compliant    Time 6    Period Weeks    Status Achieved    Target Date 02/20/21      PT SHORT TERM GOAL #2   Title Patient will be modified independent in walking on even/uneven surface with least restrictive assistive device, for 10+ minutes without rest break, reporting some difficulty or less to improve walking tolerance with community ambulation including grocery shopping, going to church,etc.    Baseline 02/03: instability, LOB multiple times while ambulating to elevator 3/31: unable to test due to throwing up 6/20: 6 minutes 7/12: unable to test due to migraine 9/12: defer to next session 11/15: unable to test due to migraine 3/2: requires heavy use of AD and seated rest breaks 4/20: walked around yard yesterday    Time 6    Period Weeks    Status Partially Met    Target Date 03/19/22      PT SHORT TERM GOAL #3   Title Patient will increase ABC scale score >80% to demonstrate better functional mobility and better confidence with ADLs.    Baseline 01/09/21: 60.625% 3/3/: 50% 3/31: 63% 6/20: 45% 7/25: 40% 9/12: 51.1% 11/15: 48% 1/17: 40% 3/2: 33% 4/20: 39% 6/6: 51%    Time 6    Period Weeks    Status Partially Met    Target Date 06/23/22              PT Long Term Goals      PT LONG TERM GOAL #1   Title Patient will increase  FOTO score to equal to or greater than 65 to demonstrate statistically significant improvement in mobility and quality of life.    Baseline scored 41/100 on 05/21/2020; 7/22 40, 8/23: 60/100, 09/30/20=40, 01/09/21: 46 3/3: 40.3% 3/31: 49% 6/20: 45% 7/12: 50.7% 9/12: 44.7% 11/15: 40% 1/17: 40% 3/2: 42%    Time 12    Period Weeks    Status Not Met    Target Date 04/30/22      PT LONG TERM GOAL #2   Title Patient will reduce falls risk as indicated by decreased  TUG time to less than 11 seconds.    Baseline scored 37.9 sec with TUG on 05/21/2020; 21seconds with elevated rollator 7/22, 8/23: 13.62 sec with up and go walker, 01/09/21: 15.81s with hiking stick 3/3: 17.98 seconds one near LOB 3/31: 15.57 seconds with walking stick 6/20: 14.8 seconds with walking stick. 7/25: 19.72 seconds with walking stick. 9/26: 17.6 seconds 11/15: unable to test due to migraine 1/17: deferred due to pain 3/2:19.25 seconds with walking stick in involved hand 4/20: deferred due to migraine and back pain 6/6: 10.5 seconds with walking stick    Time 12    Period Weeks    Status Achieved    Target Date 04/30/22      PT LONG TERM GOAL #3   Title Patient will increase right UE and LE gross strength to 4+/5 throughout to improve functional strength for independent gait, increased standing tolerance and increased ADL ability.    Baseline grossly +3/5 to -4/5 right UE and LE strength on 05/21/2020; grossly 4-/5 for RUE, grossly 4-/5 for RLE on 7/22, grossly 4-/5 RUE, grossly 4/5 RLE 3/3: see note 3/2: see note 4/20: RLE 4-/5 with quad 4/5 RUE 4/5 with bicep 3+/5 6/6: see note:    Time 12    Period Weeks    Status Partially Met    Target Date 08/04/22      PT LONG TERM GOAL #4   Title Patient will increase 10 meter walk test to >1.19ms as to improve gait speed for better community ambulation and to reduce fall risk.    Baseline on 7/22 .57 m/sself selected, fast  .73 m/s with LOB pt reported R knee buckling, elevated rollator  used, 8/23: 1.06 m/s with up and go walker, 01/09/21: 0.72 m/s with hiking stick 3/3: 0.74 m/s with walking stick 3/31: 0.81 m/s w walking stick 6/20: 1.1 m/s with walking stick    Time 12    Period Weeks    Status Achieved      PT LONG TERM GOAL #5   Title Patient will increase Berg Balance score by > 6 points to demonstrate decreased fall risk during functional activities.    Baseline 8/23: 37/56, 09/30/20=40/56, 12/20: 42/56 3/3: deferred 3/31 will perform next time; deferred due to throwing up 6/20: 46/56 7/12 38/56    Time 8    Period Weeks    Status Achieved      Additional Long Term Goals   Additional Long Term Goals Yes      PT LONG TERM GOAL #6   Title Patient (< 7109years old) will complete five times sit to stand test in < 10 seconds indicating an increased LE strength and improved balance.    Baseline 8/23: 22 sec, 09/30/20=13.04 sec, 12/20: 27.05 sec, 01/09/21: 18.01s 3/3: 23 seconds no UE support 3/31; deferred due to patient throwing up 7/12: 15.2 seconds 11/15: 26 seconds 1/17: deferred due to pain 3/2: 17.5 seconds 4/20: deferred due to migraine and back pain 6/6: 11 seconds with SUE support    Time 12    Period Weeks    Status Partially Met    Target Date 08/04/22      PT LONG TERM GOAL #7   Title Patient will improve 6 min walk test >1000 feet with LRAD for improved gait ability in community.    Baseline 8/23: not tested; 8/30 845, goal adjusted >20074f 09/30/20=600 feet - stopped test due to R ankle catching making gait unsafe, 11/25/20= 330 feet - stopped test  due to severe dizziness, vomiting/nausea, sweating making gait unsafe; 01/09/21: 720 ft with no rest breaks 3/3: deferred due to nausea 3/31: deferred due to patient throwing up 6/20: 640 ft 7/12: 670f with walking stick, no rest breaks; 9/26: 1000 ft    Time 12    Period Weeks    Status Achieved      PT LONG TERM GOAL #8   Title Patient will increase Berg Balance score by > 6 points  (52/56) to demonstrate  decreased fall risk during functional activities.    Baseline 6/20: 46/56 7/12: 38/56 with migraine 9/12: defer to next session 11/15:  unable to test due to migraine 1/17: deferred due to pain 3/2: next session 4/20: deferred due to migraine and back pain    Time 12    Period Weeks    Status Partially Met    Target Date 08/04/22      PT LONG TERM GOAL  #9   TITLE Patient will report decreased frequency of falling per week to 1x/week for decreased fall risk and improved quality of life    Baseline 12/1: 4 falls minimum a week 1/17: 1 fall last week 3/2: has been bed bound the week prior 4/20: 2-3x/week 6/6: 2x/week    Time 12    Period Weeks    Status Partially Met    Target Date 08/04/22      PT LONG TERM GOAL  #10   TITLE Patient will turn head 75% of the time without dizziness or LOB.    Baseline 6/6: patient falls and has extreme dizziness resulting in throwing up.    Time 12    Period Weeks    Status New    Target Date 08/04/22      PT LONG TERM GOAL  #11   TITLE Patient will report a worst pain of 4/10 on VAS in cervical and lumbar  to improve tolerance with ADLs and reduced symptoms with activities.    Baseline 6/6: lumbar: 10/10, cervical 8/10    Time 12    Period Weeks    Status New    Target Date 08/04/22              Plan     Clinical Impression Statement Patient presents with excellent motivation. He does have increasing migraine as session progressed. Patient is tolerating progressive strengthening well.  Patient will benefit from therapy to reduce fall risk, improve strength, and improve capacity for functional mobility for improved quality of life    Personal Factors and Comorbidities Comorbidity 3+;Time since onset of injury/illness/exacerbation    Comorbidities anxiety, back pain, COPD, headache, HTN, MI, sleep apnea, migraines    Examination-Activity Limitations Locomotion Level;Transfers;Bed Mobility;Stand;Stairs;Sleep;Squat;Bend     Examination-Participation Restrictions Driving;Medication Management    Stability/Clinical Decision Making Unstable/Unpredictable    Rehab Potential Fair    PT Frequency 1x / week    PT Duration 12 weeks    PT Treatment/Interventions ADLs/Self Care Home Management;Canalith Repostioning;Moist Heat;Electrical Stimulation;Gait training;Stair training;Functional mobility training;Therapeutic activities;Therapeutic exercise;Balance training;Neuromuscular re-education;Patient/family education;Manual techniques;Passive range of motion;Vestibular    PT Next Visit Plan Continue with functional mobility training and strengthening;    PT Home Exercise Plan Medbridge Access Code: [email or web address]    Consulted and Agree with Plan of Care Patient;Family member/caregiver    Family Member Consulted Wife               MJanna Arch PVirginia7/18/2023, 5:13 PM

## 2022-06-23 ENCOUNTER — Ambulatory Visit: Payer: BC Managed Care – PPO

## 2022-06-23 DIAGNOSIS — R262 Difficulty in walking, not elsewhere classified: Secondary | ICD-10-CM

## 2022-06-23 DIAGNOSIS — R2681 Unsteadiness on feet: Secondary | ICD-10-CM

## 2022-06-23 DIAGNOSIS — M6281 Muscle weakness (generalized): Secondary | ICD-10-CM | POA: Diagnosis not present

## 2022-06-25 ENCOUNTER — Ambulatory Visit: Payer: BC Managed Care – PPO

## 2022-06-29 NOTE — Therapy (Signed)
OUTPATIENT PHYSICAL THERAPY TREATMENT NOTE/ Physical Therapy Progress Note   Dates of reporting period  03/26/22   to   06/30/22     Patient Name: Cory Espinoza MRN: [id number] DOB:1971-09-06, 51 y.o., male Today's Date: 06/30/2022  PCP: [place] Pink MD REFERRING PROVIDER: Jennings Books MD    PT End of Session - 06/30/22 1511     Visit Number 57    Number of Visits 22    Date for PT Re-Evaluation 08/04/22    Authorization Type [employer] PPO;    Authorization Time Period 9/10 PN 4/20; next session 1/10 PN 06/30/22    PT Start Time 1515    PT Stop Time 1550    PT Time Calculation (min) 35 min    Equipment Utilized During Treatment Gait belt    Activity Tolerance Patient tolerated treatment well;Treatment limited secondary to medical complications (Comment)   provoked symptoms with certain activities   Behavior During Therapy WFL for tasks assessed/performed               Past Medical History:  Diagnosis Date   Allergy    Anginal pain (HCC)    Anxiety    Arthritis    lumbar spine   Asthma    Atrial fibrillation (HCC)    Back pain    Severe Lumbar Pain   CHF (congestive heart failure) (HCC)    COPD (chronic obstructive pulmonary disease) (HCC)    Restrictive lung disease   Coronary artery disease    Dyspnea    Dysrhythmia    afib   GERD (gastroesophageal reflux disease)    Headache    [name], onset October 2020, cluster headaches in the past   History of kidney stones    Hyperlipidemia    Hypertension    Myocardial infarction ([place])    at age 64   Pneumonia    Restrictive lung disease    Sleep apnea    unable to use cpap since onset of migraines   Past Surgical History:  Procedure Laterality Date   ABDOMINAL EXPOSURE N/A 03/25/2020   Procedure: ABDOMINAL EXPOSURE;  Surgeon: Rosetta Posner, MD;  Location[facility] OR;  Service: Vascular;  Laterality: N/A;   ANTERIOR CERVICAL DECOMP/DISCECTOMY FUSION N/A 01/23/2020   Procedure: ANTERIOR CERVICAL  DECOMPRESSION FUSION - CERVICAL SIX-CERVICAL SEVEN;  Surgeon: Earnie Larsson, MD;  Location: [facility];  Service: Neurosurgery;  Laterality: N/A;   ANTERIOR LUMBAR FUSION N/A 03/25/2020   Procedure: ANTERIOR LUMBAR INTERBODY FUSION LUMBAR FIVE-SACRAL ONE.;  Surgeon: Earnie Larsson, MD;  Location: [facility];  Service: Neurosurgery;  Laterality: N/A;  anterior   BACK SURGERY  1996   CARDIAC CATHETERIZATION Left 04/27/2016   Procedure: Left Heart Cath and Coronary Angiography;  Surgeon: Corey Skains, MD;  Location: [facility];  Service: Cardiovascular;  Laterality: Left;   CARDIAC CATHETERIZATION N/A 04/27/2016   Procedure: Intravascular Pressure Wire/FFR Study;  Surgeon: Yolonda Kida, MD;  Location: [facility];  Service: Cardiovascular;  Laterality: N/A;   CATHETERIZATION OF PULMONARY ARTERY WITH RETRIEVAL OF FOREIGN BODY Bilateral 04/07/2011   heart   DEGENERATIVE SPONDYLOLISTHESIS     KNEE ARTHROSCOPY Bilateral 2004   LUMBAR DISC SURGERY  1999   RADICULOPATHY, CERVICAL REGION     TONSILLECTOMY     Patient Active Problem List   Diagnosis Date Noted   [name] (coronary artery disease) 10/21/2021   Degenerative spondylolisthesis 03/25/2020   Physical deconditioning 03/04/2020   Lobar pneumonia, unspecified organism ([place]) 03/04/2020  Intractable hemiplegic migraine with status migrainosus 03/02/2020   Current tobacco use 02/05/2020   Abnormal findings on diagnostic imaging of lung 02/05/2020   Shortness of breath 02/05/2020   Herniated disc, cervical 01/23/2020   Cervical spondylosis with myelopathy and radiculopathy 01/23/2020   Pre-operative respiratory examination 01/12/2020   Weakness on right side of face 12/22/2019   Osteoarthritis of spine with radiculopathy, lumbar region 12/11/2019   Intractable migraine with aura with status migrainosus 11/09/2019   Former smoker 08/27/2018   Benign essential HTN 08/27/2018   Cough productive of purulent sputum 11/08/2017   GERD  (gastroesophageal reflux disease) 05/06/2017   Contusion of hand 04/22/2017   BP (high blood pressure) 04/22/2017   Oxygen desaturation 04/22/2017   Prostate lump 04/22/2017   Unstable angina ([place]) 04/27/2016   Chest pain 04/24/2016   Morbid obesity ([place]) 01/29/2016   Elevated ALT measurement 10/30/2015   Nonspecific elevation of levels of transaminase and lactic acid dehydrogenase (ldh) 10/30/2015   Reduced libido 10/29/2015   Hyperlipidemia 08/06/2015   Anxiety 08/06/2015   Atherosclerosis of coronary artery 08/06/2015   Childhood asthma 08/06/2015   Chronic obstructive pulmonary disease ([place]) 08/06/2015   OSA (obstructive sleep apnea) 08/06/2015   Anxiety disorder 08/06/2015   Atherosclerosis of native coronary artery of native heart with stable angina pectoris ([place]) 08/29/3006   Uncomplicated asthma 62/26/3335    REFERRING DIAG: BPPV/unsteadiness  THERAPY DIAG:  Muscle weakness (generalized)  Unsteadiness on feet  Difficulty in walking, not elsewhere classified  Rationale for Evaluation and Treatment Rehabilitation  PERTINENT HISTORY: Pt has a complex medical history. 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 He states that he fell 10-12 feet and struck his head, neck, and back. He was able to finish the course which took him approximately 45 minutes more to complete. He does endorse a second fall while finishing the course but did not suffer a second head injury. He states that he started getting headaches that day which have persisted since that time. 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 He does report improvement in his headaches recently. Neurology was concerned about possible BPPV and have referred him for a vestibular evaluation. In addition since the injury, pt developed RUE and RLE pain and weakness. Pt states that NCV showed abnormal nerve conduction in  RUE. He has since undergone a C7-7 ACDF on 01/23/20. He reports initial improvement in RUE strength, but states that he has a gradual return of his RUE weakness. He was also having significant RLE weakness and pain and underwent and L5-S1 anterior lumbar interbody fusion on 03/25/20. Patient reports that he does not have any back precautions but needs to wear the low back brace for one more month. Patient reports he has a follow-up appointment with the surgeon next month. Patient reports he has a follow-up appointment with Dr. Brigitte Pulse, neurologist, in August. He arrives to therapy ambulating with an upright rollator walker. Pt denies any mention of concussion after his injury. He has been having cognitive issues since his injury and has previously had a heavy metals screen as well as neurocognitive testing. He has had an MRI of his brain with and without contrast on 12/18/19. Results were mildly motion degraded examination. No evidence of acute intracranial abnormality. Minimal chronic small vessel ischemic disease. He has tremor in both his hands. He has a positive family history of Parkinson's disease  in his grandfather. He is complaining of constant dizziness and unsteadiness which are worse with activity. Patient states that he frequently loses his balance and his wife has to steady him.  PRECAUTIONS: Falls  SUBJECTIVE: Patient's wife reports today is a high pain/confusion level day. Confusion since this morning.  [place] when trying to fix the bed  PAIN:  Are you having pain? Yes: NPRS scale: 6/10 Pain location: head Pain description: migraine Aggravating factors: turning head, weather Relieving factors: medicine, dark room     TODAY'S TREATMENT:  Goals: FOTO RUE and RLE strength  Right Left  Shoulder Flexion 3+ 4  Shoulder Abduction 4- 4+  ER 4- 5  IR 4 5  bicep 4 5  tricep 4- 5     Right Left  Hip flexion 4 4+  Hip Abduction 4- 5  Hip Adduction 4- 5  Knee Extension  4 5  Knee Flexion  3+ 5  DF 3 5  PF 4- 5    5x STS: defer BERG: defer Frequency of falling: 2x /week  Head turn; day depending VAS cervical: 5-6/ 10  VAS lumbar: 6-7/10   Seated: Stm to bilateral upper trap x 6 minutes with trigger point reduction  Grade II mobilizations cervical spine x 3 minutes  Ambulate 600 ft with walking stick and close CGA.    PATIENT EDUCATION: Education details: exercise technique, body mechanics Person educated: Patient Education method: Explanation, Demonstration, Tactile cues, and Verbal cues Education comprehension: verbalized understanding, returned demonstration, verbal cues required, and tactile cues required   HOME EXERCISE PROGRAM: See prior session    PT Short Term Goals       PT SHORT TERM GOAL #1   Title Patient will be independent in home exercise program to improve strength/mobility for better functional independence with ADLs and for self-management.    Baseline 3/3 HEP compliant 3/31 hep compliant    Time 6    Period Weeks    Status Achieved    Target Date 02/20/21      PT SHORT TERM GOAL #2   Title Patient will be modified independent in walking on even/uneven surface with least restrictive assistive device, for 10+ minutes without rest break, reporting some difficulty or less to improve walking tolerance with community ambulation including grocery shopping, going to church,etc.    Baseline 02/03: instability, LOB multiple times while ambulating to elevator 3/31: unable to test due to throwing up 6/20: 6 minutes 7/12: unable to test due to migraine 9/12: defer to next session 11/15: unable to test due to migraine 3/2: requires heavy use of AD and seated rest breaks 4/20: walked around yard yesterday    Time 6    Period Weeks    Status Partially Met    Target Date 03/19/22      PT SHORT TERM GOAL #3   Title Patient will increase ABC scale score >80% to demonstrate better functional mobility and better confidence with ADLs.    Baseline  01/09/21: 60.625% 3/3/: 50% 3/31: 63% 6/20: 45% 7/25: 40% 9/12: 51.1% 11/15: 48% 1/17: 40% 3/2: 33% 4/20: 39% 6/6: 51%    Time 6    Period Weeks    Status Partially Met    Target Date 06/23/22              PT Long Term Goals      PT LONG TERM GOAL #1   Title Patient will increase FOTO score to equal to or greater than 65 to demonstrate statistically  significant improvement in mobility and quality of life.    Baseline scored 41/100 on 05/21/2020; 7/22 40, 8/23: 60/100, 09/30/20=40, 01/09/21: 46 3/3: 40.3% 3/31: 49% 6/20: 45% 7/12: 50.7% 9/12: 44.7% 11/15: 40% 1/17: 40% 3/2: 42%    Time 12    Period Weeks    Status Not Met    Target Date 04/30/22      PT LONG TERM GOAL #2   Title Patient will reduce falls risk as indicated by decreased TUG time to less than 11 seconds.    Baseline scored 37.9 sec with TUG on 05/21/2020; 21seconds with elevated rollator 7/22, 8/23: 13.62 sec with up and go walker, 01/09/21: 15.81s with hiking stick 3/3: 17.98 seconds one near LOB 3/31: 15.57 seconds with walking stick 6/20: 14.8 seconds with walking stick. 7/25: 19.72 seconds with walking stick. 9/26: 17.6 seconds 11/15: unable to test due to migraine 1/17: deferred due to pain 3/2:19.25 seconds with walking stick in involved hand 4/20: deferred due to migraine and back pain 6/6: 10.5 seconds with walking stick    Time 12    Period Weeks    Status Achieved    Target Date 04/30/22      PT LONG TERM GOAL #3   Title Patient will increase right UE and LE gross strength to 4+/5 throughout to improve functional strength for independent gait, increased standing tolerance and increased ADL ability.    Baseline grossly +3/5 to -4/5 right UE and LE strength on 05/21/2020; grossly 4-/5 for RUE, grossly 4-/5 for RLE on 7/22, grossly 4-/5 RUE, grossly 4/5 RLE 3/3: see note 3/2: see note 4/20: RLE 4-/5 with quad 4/5 RUE 4/5 with bicep 3+/5 6/6: see note: 7/25: see note   Time 12    Period Weeks    Status Partially  Met    Target Date 08/04/22      PT LONG TERM GOAL #4   Title Patient will increase 10 meter walk test to >1.69m/s as to improve gait speed for better community ambulation and to reduce fall risk.    Baseline on 7/22 .57 m/sself selected, fast  .73 m/s with LOB pt reported R knee buckling, elevated rollator used, 8/23: 1.06 m/s with up and go walker, 01/09/21: 0.72 m/s with hiking stick 3/3: 0.74 m/s with walking stick 3/31: 0.81 m/s w walking stick 6/20: 1.1 m/s with walking stick    Time 12    Period Weeks    Status Achieved      PT LONG TERM GOAL #5   Title Patient will increase Berg Balance score by > 6 points to demonstrate decreased fall risk during functional activities.    Baseline 8/23: 37/56, 09/30/20=40/56, 12/20: 42/56 3/3: deferred 3/31 will perform next time; deferred due to throwing up 6/20: 46/56 7/12 38/56    Time 8    Period Weeks    Status Achieved      Additional Long Term Goals   Additional Long Term Goals Yes      PT LONG TERM GOAL #6   Title Patient (< 67 years old) will complete five times sit to stand test in < 10 seconds indicating an increased LE strength and improved balance.    Baseline 8/23: 22 sec, 09/30/20=13.04 sec, 12/20: 27.05 sec, 01/09/21: 18.01s 3/3: 23 seconds no UE support 3/31; deferred due to patient throwing up 7/12: 15.2 seconds 11/15: 26 seconds 1/17: deferred due to pain 3/2: 17.5 seconds 4/20: deferred due to migraine and back pain 6/6: 11 seconds with  SUE support    Time 12    Period Weeks    Status Partially Met    Target Date 08/04/22      PT LONG TERM GOAL #7   Title Patient will improve 6 min walk test >1000 feet with LRAD for improved gait ability in community.    Baseline 8/23: not tested; 8/30 845, goal adjusted >2035f, 09/30/20=600 feet - stopped test due to R ankle catching making gait unsafe, 11/25/20= 330 feet - stopped test due to severe dizziness, vomiting/nausea, sweating making gait unsafe; 01/09/21: 720 ft with no rest  breaks 3/3: deferred due to nausea 3/31: deferred due to patient throwing up 6/20: 640 ft 7/12: [id number] walking stick, no rest breaks; 9/26: 1000 ft    Time 12    Period Weeks    Status Achieved      PT LONG TERM GOAL #8   Title Patient will increase Berg Balance score by > 6 points  (52/56) to demonstrate decreased fall risk during functional activities.    Baseline 6/20: 46/56 7/12: 38/56 with migraine 9/12: defer to next session 11/15:  unable to test due to migraine 1/17: deferred due to pain 3/2: next session 4/20: deferred due to migraine and back pain    Time 12    Period Weeks    Status Partially Met    Target Date 08/04/22      PT LONG TERM GOAL  #9   TITLE Patient will report decreased frequency of falling per week to 1x/week for decreased fall risk and improved quality of life    Baseline 12/1: 4 falls minimum a week 1/17: 1 fall last week 3/2: has been bed bound the week prior 4/20: 2-3x/week 6/6: 2x/week 7/25: 2x/week   Time 12    Period Weeks    Status Partially Met    Target Date 08/04/22      PT LONG TERM GOAL  #10   TITLE Patient will turn head 75% of the time without dizziness or LOB.    Baseline 6/6: patient falls and has extreme dizziness resulting in throwing up. 7/25: 25% of the time   Time 12    Period Weeks    Status Partially Met   Target Date 08/04/22      PT LONG TERM GOAL  #11   TITLE Patient will report a worst pain of 4/10 on VAS in cervical and lumbar  to improve tolerance with ADLs and reduced symptoms with activities.    Baseline 6/6: lumbar: 10/10, cervical 8/10 7/25: lumbar 6-7/10; cervical 5-6/10    Time 12    Period Weeks    Status Partially Met   Target Date 08/04/22               Plan     Clinical Impression Statement Patient session terminated early due to increased confusion and migraine. Patient showing precursors to seizures that is aggravated with exercise. Will hold BERG and 5x STS until next session, patient wife is  agreeable.  Patient's condition has the potential to improve in response to therapy. Maximum improvement is yet to be obtained. The anticipated improvement is attainable and reasonable in a generally predictable time.  Patient will benefit from therapy to reduce fall risk, improve strength, and improve capacity for functional mobility for improved quality of life    Personal Factors and Comorbidities Comorbidity 3+;Time since onset of injury/illness/exacerbation    Comorbidities anxiety, back pain, COPD, headache, HTN, MI, sleep apnea, migraines  Examination-Activity Limitations Locomotion Level;Transfers;Bed Mobility;Stand;Stairs;Sleep;Squat;Bend    Examination-Participation Restrictions Driving;Medication Management    Stability/Clinical Decision Making Unstable/Unpredictable    Rehab Potential Fair    PT Frequency 1x / week    PT Duration 12 weeks    PT Treatment/Interventions ADLs/Self Care Home Management;Canalith Repostioning;Moist Heat;Electrical Stimulation;Gait training;Stair training;Functional mobility training;Therapeutic activities;Therapeutic exercise;Balance training;Neuromuscular re-education;Patient/family education;Manual techniques;Passive range of motion;Vestibular    PT Next Visit Plan Continue with functional mobility training and strengthening;    PT Home Exercise Plan Medbridge Access Code: [email or web address]    Consulted and Agree with Plan of Care Patient;Family member/caregiver    Family Member Consulted Wife               Janna Arch, [place] 06/30/2022, 3:54 PM

## 2022-06-30 ENCOUNTER — Ambulatory Visit: Payer: BC Managed Care – PPO

## 2022-06-30 DIAGNOSIS — M6281 Muscle weakness (generalized): Secondary | ICD-10-CM | POA: Diagnosis not present

## 2022-06-30 DIAGNOSIS — R262 Difficulty in walking, not elsewhere classified: Secondary | ICD-10-CM

## 2022-06-30 DIAGNOSIS — R2681 Unsteadiness on feet: Secondary | ICD-10-CM

## 2022-07-02 ENCOUNTER — Ambulatory Visit: Payer: BC Managed Care – PPO

## 2022-07-07 ENCOUNTER — Ambulatory Visit: Payer: BC Managed Care – PPO

## 2022-07-09 ENCOUNTER — Ambulatory Visit: Payer: BC Managed Care – PPO

## 2022-07-14 ENCOUNTER — Ambulatory Visit: Payer: BC Managed Care – PPO | Attending: Neurology

## 2022-07-14 DIAGNOSIS — M6281 Muscle weakness (generalized): Secondary | ICD-10-CM | POA: Diagnosis present

## 2022-07-14 DIAGNOSIS — R262 Difficulty in walking, not elsewhere classified: Secondary | ICD-10-CM | POA: Diagnosis present

## 2022-07-14 DIAGNOSIS — R2681 Unsteadiness on feet: Secondary | ICD-10-CM

## 2022-07-14 NOTE — Therapy (Signed)
OUTPATIENT PHYSICAL THERAPY TREATMENT NOTE   Patient Name: Cory Espinoza MRN: [id number] DOB:11/30/1971, 51 y.o., male Today's Date: 07/14/2022  PCP: [place] Pink MD REFERRING PROVIDER: Jennings Books MD    PT End of Session - 07/14/22 1503     Visit Number 91    Number of Visits 104    Date for PT Re-Evaluation 08/04/22    Authorization Type [employer] PPO;    Authorization Time Period 9/10 PN 4/20; next session 1/10 PN 06/30/22    PT Start Time 1515    PT Stop Time 1559    PT Time Calculation (min) 44 min    Equipment Utilized During Treatment Gait belt    Activity Tolerance Patient tolerated treatment well;Treatment limited secondary to medical complications (Comment)   provoked symptoms with certain activities   Behavior During Therapy WFL for tasks assessed/performed                Past Medical History:  Diagnosis Date   Allergy    Anginal pain (HCC)    Anxiety    Arthritis    lumbar spine   Asthma    Atrial fibrillation (HCC)    Back pain    Severe Lumbar Pain   CHF (congestive heart failure) (HCC)    COPD (chronic obstructive pulmonary disease) (HCC)    Restrictive lung disease   Coronary artery disease    Dyspnea    Dysrhythmia    afib   GERD (gastroesophageal reflux disease)    Headache    [name], onset October 2020, cluster headaches in the past   History of kidney stones    Hyperlipidemia    Hypertension    Myocardial infarction ([place])    at age 48   Pneumonia    Restrictive lung disease    Sleep apnea    unable to use cpap since onset of migraines   Past Surgical History:  Procedure Laterality Date   ABDOMINAL EXPOSURE N/A 03/25/2020   Procedure: ABDOMINAL EXPOSURE;  Surgeon: Rosetta Posner, MD;  Location: [facility] OR;  Service: Vascular;  Laterality: N/A;   ANTERIOR CERVICAL DECOMP/DISCECTOMY FUSION N/A 01/23/2020   Procedure: ANTERIOR CERVICAL DECOMPRESSION FUSION - CERVICAL SIX-CERVICAL SEVEN;  Surgeon: Earnie Larsson, MD;  Location: [place];   Service: Neurosurgery;  Laterality: N/A;   ANTERIOR LUMBAR FUSION N/A 03/25/2020   Procedure: ANTERIOR LUMBAR INTERBODY FUSION LUMBAR FIVE-SACRAL ONE.;  Surgeon: Earnie Larsson, MD;  Location: [place];  Service: Neurosurgery;  Laterality: N/A;  anterior   BACK SURGERY  1996   CARDIAC CATHETERIZATION Left 04/27/2016   Procedure: Left Heart Cath and Coronary Angiography;  Surgeon: Corey Skains, MD;  Location: [place] [facility];  Service: Cardiovascular;  Laterality: Left;   CARDIAC CATHETERIZATION N/A 04/27/2016   Procedure: Intravascular Pressure Wire/FFR Study;  Surgeon: Yolonda Kida, MD;  Location: [place] [facility];  Service: Cardiovascular;  Laterality: N/A;   CATHETERIZATION OF PULMONARY ARTERY WITH RETRIEVAL OF FOREIGN BODY Bilateral 04/07/2011   heart   DEGENERATIVE SPONDYLOLISTHESIS     KNEE ARTHROSCOPY Bilateral 2004   LUMBAR DISC SURGERY  1999   RADICULOPATHY, CERVICAL REGION     TONSILLECTOMY     Patient Active Problem List   Diagnosis Date Noted   [name] (coronary artery disease) 10/21/2021   Degenerative spondylolisthesis 03/25/2020   Physical deconditioning 03/04/2020   Lobar pneumonia, unspecified organism ([place]) 03/04/2020   Intractable hemiplegic migraine with status migrainosus 03/02/2020   Current tobacco use 02/05/2020   Abnormal findings  on diagnostic imaging of lung 02/05/2020   Shortness of breath 02/05/2020   Herniated disc, cervical 01/23/2020   Cervical spondylosis with myelopathy and radiculopathy 01/23/2020   Pre-operative respiratory examination 01/12/2020   Weakness on right side of face 12/22/2019   Osteoarthritis of spine with radiculopathy, lumbar region 12/11/2019   Intractable migraine with aura with status migrainosus 11/09/2019   Former smoker 08/27/2018   Benign essential HTN 08/27/2018   Cough productive of purulent sputum 11/08/2017   GERD (gastroesophageal reflux disease) 05/06/2017   Contusion of hand 04/22/2017   BP (high blood pressure)  04/22/2017   Oxygen desaturation 04/22/2017   Prostate lump 04/22/2017   Unstable angina ([place]) 04/27/2016   Chest pain 04/24/2016   Morbid obesity ([place]) 01/29/2016   Elevated ALT measurement 10/30/2015   Nonspecific elevation of levels of transaminase and lactic acid dehydrogenase (ldh) 10/30/2015   Reduced libido 10/29/2015   Hyperlipidemia 08/06/2015   Anxiety 08/06/2015   Atherosclerosis of coronary artery 08/06/2015   Childhood asthma 08/06/2015   Chronic obstructive pulmonary disease ([place]) 08/06/2015   OSA (obstructive sleep apnea) 08/06/2015   Anxiety disorder 08/06/2015   Atherosclerosis of native coronary artery of native heart with stable angina pectoris ([place]) 62/94/7654   Uncomplicated asthma 65/02/5464    REFERRING DIAG: BPPV/unsteadiness  THERAPY DIAG:  Muscle weakness (generalized)  Unsteadiness on feet  Difficulty in walking, not elsewhere classified  Rationale for Evaluation and Treatment Rehabilitation  PERTINENT HISTORY: Pt has a complex medical history. 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 He states that he fell 10-12 feet and struck his head, neck, and back. He was able to finish the course which took him approximately 45 minutes more to complete. He does endorse a second fall while finishing the course but did not suffer a second head injury. He states that he started getting headaches that day which have persisted since that time. 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 He does report improvement in his headaches recently. Neurology was concerned about possible BPPV and have referred him for a vestibular evaluation. In addition since the injury, pt developed RUE and RLE pain and weakness. Pt states that NCV showed abnormal nerve conduction in RUE. He has since undergone a C7-7 ACDF on 01/23/20. He reports initial improvement in RUE strength, but  states that he has a gradual return of his RUE weakness. He was also having significant RLE weakness and pain and underwent and L5-S1 anterior lumbar interbody fusion on 03/25/20. Patient reports that he does not have any back precautions but needs to wear the low back brace for one more month. Patient reports he has a follow-up appointment with the surgeon next month. Patient reports he has a follow-up appointment with Dr. Brigitte Pulse, neurologist, in August. He arrives to therapy ambulating with an upright rollator walker. Pt denies any mention of concussion after his injury. He has been having cognitive issues since his injury and has previously had a heavy metals screen as well as neurocognitive testing. He has had an MRI of his brain with and without contrast on 12/18/19. Results were mildly motion degraded examination. No evidence of acute intracranial abnormality. Minimal chronic small vessel ischemic disease. He has tremor in both his hands. He has a positive family history of Parkinson's disease in his grandfather. He is complaining of constant dizziness and unsteadiness which are worse with activity. Patient  states that he frequently loses his balance and his wife has to steady him.  PRECAUTIONS: Falls  SUBJECTIVE: Patient had a seizure since last session. Had a fall that resulted in being bedbound for multiple days.    PAIN:  Are you having pain? Yes: NPRS scale: 6/10 Pain location: head Pain description: migraine Aggravating factors: turning head, weather Relieving factors: medicine, dark room     TODAY'S TREATMENT:  Goals: FOTO RUE and RLE strength  Right Left  Shoulder Flexion 3+ 4  Shoulder Abduction 4- 4+  ER 4- 5  IR 4 5  bicep 4 5  tricep 4- 5     Right Left  Hip flexion 4 4+  Hip Abduction 4- 5  Hip Adduction 4- 5  Knee Extension  4 5  Knee Flexion 3+ 5  DF 3 5  PF 4- 5    5x STS: defer BERG: defer  Treatment: Assess low back pain:  -tender to palpation, closed  fist pertubation -unable to feel tuning fork in lumbar and thoracic spine  *patient has signs and symptoms indicating possible shift in hardware however unable to determine if shift in post surgical region or swelling, recommend follow up with physician.   Seated BTB: -hamstring curl 15x each LE; x2 sets -abduction 15x; 2 sets -march 15x 2 sets -paloff press 15x each side   Ambulate 800 ft with walking stick and close CGA. Car transfer CGA    PATIENT EDUCATION: Education details: exercise technique, Economist Person educated: Patient Education method: Explanation, Demonstration, Tactile cues, and Verbal cues Education comprehension: verbalized understanding, returned demonstration, verbal cues required, and tactile cues required   HOME EXERCISE PROGRAM: See prior session    PT Short Term Goals       PT SHORT TERM GOAL #1   Title Patient will be independent in home exercise program to improve strength/mobility for better functional independence with ADLs and for self-management.    Baseline 3/3 HEP compliant 3/31 hep compliant    Time 6    Period Weeks    Status Achieved    Target Date 02/20/21      PT SHORT TERM GOAL #2   Title Patient will be modified independent in walking on even/uneven surface with least restrictive assistive device, for 10+ minutes without rest break, reporting some difficulty or less to improve walking tolerance with community ambulation including grocery shopping, going to church,etc.    Baseline 02/03: instability, LOB multiple times while ambulating to elevator 3/31: unable to test due to throwing up 6/20: 6 minutes 7/12: unable to test due to migraine 9/12: defer to next session 11/15: unable to test due to migraine 3/2: requires heavy use of AD and seated rest breaks 4/20: walked around yard yesterday    Time 6    Period Weeks    Status Partially Met    Target Date 03/19/22      PT SHORT TERM GOAL #3   Title Patient will increase ABC scale  score >80% to demonstrate better functional mobility and better confidence with ADLs.    Baseline 01/09/21: 60.625% 3/3/: 50% 3/31: 63% 6/20: 45% 7/25: 40% 9/12: 51.1% 11/15: 48% 1/17: 40% 3/2: 33% 4/20: 39% 6/6: 51%    Time 6    Period Weeks    Status Partially Met    Target Date 06/23/22              PT Long Term Goals      PT LONG TERM GOAL #1  Title Patient will increase FOTO score to equal to or greater than 65 to demonstrate statistically significant improvement in mobility and quality of life.    Baseline scored 41/100 on 05/21/2020; 7/22 40, 8/23: 60/100, 09/30/20=40, 01/09/21: 46 3/3: 40.3% 3/31: 49% 6/20: 45% 7/12: 50.7% 9/12: 44.7% 11/15: 40% 1/17: 40% 3/2: 42%    Time 12    Period Weeks    Status Not Met    Target Date 04/30/22      PT LONG TERM GOAL #2   Title Patient will reduce falls risk as indicated by decreased TUG time to less than 11 seconds.    Baseline scored 37.9 sec with TUG on 05/21/2020; 21seconds with elevated rollator 7/22, 8/23: 13.62 sec with up and go walker, 01/09/21: 15.81s with hiking stick 3/3: 17.98 seconds one near LOB 3/31: 15.57 seconds with walking stick 6/20: 14.8 seconds with walking stick. 7/25: 19.72 seconds with walking stick. 9/26: 17.6 seconds 11/15: unable to test due to migraine 1/17: deferred due to pain 3/2:19.25 seconds with walking stick in involved hand 4/20: deferred due to migraine and back pain 6/6: 10.5 seconds with walking stick    Time 12    Period Weeks    Status Achieved    Target Date 04/30/22      PT LONG TERM GOAL #3   Title Patient will increase right UE and LE gross strength to 4+/5 throughout to improve functional strength for independent gait, increased standing tolerance and increased ADL ability.    Baseline grossly +3/5 to -4/5 right UE and LE strength on 05/21/2020; grossly 4-/5 for RUE, grossly 4-/5 for RLE on 7/22, grossly 4-/5 RUE, grossly 4/5 RLE 3/3: see note 3/2: see note 4/20: RLE 4-/5 with quad 4/5 RUE  4/5 with bicep 3+/5 6/6: see note: 7/25: see note   Time 12    Period Weeks    Status Partially Met    Target Date 08/04/22      PT LONG TERM GOAL #4   Title Patient will increase 10 meter walk test to >1.75ms as to improve gait speed for better community ambulation and to reduce fall risk.    Baseline on 7/22 .57 m/sself selected, fast  .73 m/s with LOB pt reported R knee buckling, elevated rollator used, 8/23: 1.06 m/s with up and go walker, 01/09/21: 0.72 m/s with hiking stick 3/3: 0.74 m/s with walking stick 3/31: 0.81 m/s w walking stick 6/20: 1.1 m/s with walking stick    Time 12    Period Weeks    Status Achieved      PT LONG TERM GOAL #5   Title Patient will increase Berg Balance score by > 6 points to demonstrate decreased fall risk during functional activities.    Baseline 8/23: 37/56, 09/30/20=40/56, 12/20: 42/56 3/3: deferred 3/31 will perform next time; deferred due to throwing up 6/20: 46/56 7/12 38/56    Time 8    Period Weeks    Status Achieved      Additional Long Term Goals   Additional Long Term Goals Yes      PT LONG TERM GOAL #6   Title Patient (< 63years old) will complete five times sit to stand test in < 10 seconds indicating an increased LE strength and improved balance.    Baseline 8/23: 22 sec, 09/30/20=13.04 sec, 12/20: 27.05 sec, 01/09/21: 18.01s 3/3: 23 seconds no UE support 3/31; deferred due to patient throwing up 7/12: 15.2 seconds 11/15: 26 seconds 1/17: deferred due to  pain 3/2: 17.5 seconds 4/20: deferred due to migraine and back pain 6/6: 11 seconds with SUE support    Time 12    Period Weeks    Status Partially Met    Target Date 08/04/22      PT LONG TERM GOAL #7   Title Patient will improve 6 min walk test >1000 feet with LRAD for improved gait ability in community.    Baseline 8/23: not tested; 8/30 845, goal adjusted >2025f, 09/30/20=600 feet - stopped test due to R ankle catching making gait unsafe, 11/25/20= 330 feet - stopped test due to  severe dizziness, vomiting/nausea, sweating making gait unsafe; 01/09/21: 720 ft with no rest breaks 3/3: deferred due to nausea 3/31: deferred due to patient throwing up 6/20: 640 ft 7/12: [id number] walking stick, no rest breaks; 9/26: 1000 ft    Time 12    Period Weeks    Status Achieved      PT LONG TERM GOAL #8   Title Patient will increase Berg Balance score by > 6 points  (52/56) to demonstrate decreased fall risk during functional activities.    Baseline 6/20: 46/56 7/12: 38/56 with migraine 9/12: defer to next session 11/15:  unable to test due to migraine 1/17: deferred due to pain 3/2: next session 4/20: deferred due to migraine and back pain    Time 12    Period Weeks    Status Partially Met    Target Date 08/04/22      PT LONG TERM GOAL  #9   TITLE Patient will report decreased frequency of falling per week to 1x/week for decreased fall risk and improved quality of life    Baseline 12/1: 4 falls minimum a week 1/17: 1 fall last week 3/2: has been bed bound the week prior 4/20: 2-3x/week 6/6: 2x/week 7/25: 2x/week   Time 12    Period Weeks    Status Partially Met    Target Date 08/04/22      PT LONG TERM GOAL  #10   TITLE Patient will turn head 75% of the time without dizziness or LOB.    Baseline 6/6: patient falls and has extreme dizziness resulting in throwing up. 7/25: 25% of the time   Time 12    Period Weeks    Status Partially Met   Target Date 08/04/22      PT LONG TERM GOAL  #11   TITLE Patient will report a worst pain of 4/10 on VAS in cervical and lumbar  to improve tolerance with ADLs and reduced symptoms with activities.    Baseline 6/6: lumbar: 10/10, cervical 8/10 7/25: lumbar 6-7/10; cervical 5-6/10    Time 12    Period Weeks    Status Partially Met   Target Date 08/04/22               Plan     Clinical Impression Statement Patient session limited by latest fall resulting in pain. Patient low back assessed and was found to have signs and  symptoms indicative of potential shift of hardware however testing is limited by swelling. Patient and patient's wife educated on need to follow up with physician, agreeable to plan.  Patient will benefit from therapy to reduce fall risk, improve strength, and improve capacity for functional mobility for improved quality of life    Personal Factors and Comorbidities Comorbidity 3+;Time since onset of injury/illness/exacerbation    Comorbidities anxiety, back pain, COPD, headache, HTN, MI, sleep apnea, migraines  Examination-Activity Limitations Locomotion Level;Transfers;Bed Mobility;Stand;Stairs;Sleep;Squat;Bend    Examination-Participation Restrictions Driving;Medication Management    Stability/Clinical Decision Making Unstable/Unpredictable    Rehab Potential Fair    PT Frequency 1x / week    PT Duration 12 weeks    PT Treatment/Interventions ADLs/Self Care Home Management;Canalith Repostioning;Moist Heat;Electrical Stimulation;Gait training;Stair training;Functional mobility training;Therapeutic activities;Therapeutic exercise;Balance training;Neuromuscular re-education;Patient/family education;Manual techniques;Passive range of motion;Vestibular    PT Next Visit Plan Continue with functional mobility training and strengthening;    PT Home Exercise Plan Medbridge Access Code: [email or web address]    Consulted and Agree with Plan of Care Patient;Family member/caregiver    Family Member Consulted Wife               Janna Arch, [place] 07/14/2022, 4:00 PM

## 2022-07-16 ENCOUNTER — Ambulatory Visit: Payer: BC Managed Care – PPO

## 2022-07-21 ENCOUNTER — Ambulatory Visit: Payer: BC Managed Care – PPO

## 2022-07-23 ENCOUNTER — Ambulatory Visit: Payer: BC Managed Care – PPO

## 2022-07-28 ENCOUNTER — Ambulatory Visit: Payer: BC Managed Care – PPO

## 2022-07-28 DIAGNOSIS — M6281 Muscle weakness (generalized): Secondary | ICD-10-CM

## 2022-07-28 DIAGNOSIS — R262 Difficulty in walking, not elsewhere classified: Secondary | ICD-10-CM

## 2022-07-28 DIAGNOSIS — R2681 Unsteadiness on feet: Secondary | ICD-10-CM

## 2022-07-28 NOTE — Therapy (Addendum)
OUTPATIENT PHYSICAL THERAPY TREATMENT NOTE/RECERT   Patient Name: Cory Espinoza MRN: [id number] DOB:July 06, 1971, 51 y.o., male Today's Date: 08/26/2022  PCP: [place] Pink MD REFERRING PROVIDER: Jennings Books MD          Past Medical History:  Diagnosis Date   Allergy    Anginal pain [facility])    Anxiety    Arthritis    lumbar spine   Asthma    Atrial fibrillation ([place])    Back pain    Severe Lumbar Pain   CHF (congestive heart failure) (HCC)    COPD (chronic obstructive pulmonary disease) ([place])    Restrictive lung disease   Coronary artery disease    Dyspnea    Dysrhythmia    afib   GERD (gastroesophageal reflux disease)    Headache    [name], onset October 2020, cluster headaches in the past   History of kidney stones    Hyperlipidemia    Hypertension    Myocardial infarction ([place])    at age 11   Pneumonia    Restrictive lung disease    Sleep apnea    unable to use cpap since onset of migraines   Past Surgical History:  Procedure Laterality Date   ABDOMINAL EXPOSURE N/A 03/25/2020   Procedure: ABDOMINAL EXPOSURE;  Surgeon: Rosetta Posner, MD;  Location: [facility] OR;  Service: Vascular;  Laterality: N/A;   ANTERIOR CERVICAL DECOMP/DISCECTOMY FUSION N/A 01/23/2020   Procedure: ANTERIOR CERVICAL DECOMPRESSION FUSION - CERVICAL SIX-CERVICAL SEVEN;  Surgeon: Earnie Larsson, MD;  Location: [facility];  Service: Neurosurgery;  Laterality: N/A;   ANTERIOR LUMBAR FUSION N/A 03/25/2020   Procedure: ANTERIOR LUMBAR INTERBODY FUSION LUMBAR FIVE-SACRAL ONE.;  Surgeon: Earnie Larsson, MD;  Location: [facility];  Service: Neurosurgery;  Laterality: N/A;  anterior   BACK SURGERY  1996   CARDIAC CATHETERIZATION Left 04/27/2016   Procedure: Left Heart Cath and Coronary Angiography;  Surgeon: Corey Skains, MD;  Location: [facility];  Service: Cardiovascular;  Laterality: Left;   CARDIAC CATHETERIZATION N/A 04/27/2016   Procedure: Intravascular Pressure Wire/FFR Study;  Surgeon:  Yolonda Kida, MD;  Location: [facility];  Service: Cardiovascular;  Laterality: N/A;   CATHETERIZATION OF PULMONARY ARTERY WITH RETRIEVAL OF FOREIGN BODY Bilateral 04/07/2011   heart   DEGENERATIVE SPONDYLOLISTHESIS     KNEE ARTHROSCOPY Bilateral 2004   LUMBAR DISC SURGERY  1999   RADICULOPATHY, CERVICAL REGION     TONSILLECTOMY     Patient Active Problem List   Diagnosis Date Noted   [name] (coronary artery disease) 10/21/2021   Degenerative spondylolisthesis 03/25/2020   Physical deconditioning 03/04/2020   Lobar pneumonia, unspecified organism ([place]) 03/04/2020   Intractable hemiplegic migraine with status migrainosus 03/02/2020   Current tobacco use 02/05/2020   Abnormal findings on diagnostic imaging of lung 02/05/2020   Shortness of breath 02/05/2020   Herniated disc, cervical 01/23/2020   Cervical spondylosis with myelopathy and radiculopathy 01/23/2020   Pre-operative respiratory examination 01/12/2020   Weakness on right side of face 12/22/2019   Osteoarthritis of spine with radiculopathy, lumbar region 12/11/2019   Intractable migraine with aura with status migrainosus 11/09/2019   Former smoker 08/27/2018   Benign essential HTN 08/27/2018   Cough productive of purulent sputum 11/08/2017   GERD (gastroesophageal reflux disease) 05/06/2017   Contusion of hand 04/22/2017   BP (high blood pressure) 04/22/2017   Oxygen desaturation 04/22/2017   Prostate lump 04/22/2017   Unstable angina (HCC) 04/27/2016   Chest pain 04/24/2016  Morbid obesity ([place]) 01/29/2016   Elevated ALT measurement 10/30/2015   Nonspecific elevation of levels of transaminase and lactic acid dehydrogenase (ldh) 10/30/2015   Reduced libido 10/29/2015   Hyperlipidemia 08/06/2015   Anxiety 08/06/2015   Atherosclerosis of coronary artery 08/06/2015   Childhood asthma 08/06/2015   Chronic obstructive pulmonary disease ([place]) 08/06/2015   OSA (obstructive sleep apnea) 08/06/2015   Anxiety  disorder 08/06/2015   Atherosclerosis of native coronary artery of native heart with stable angina pectoris ([place]) 11/57/2620   Uncomplicated asthma 35/59/7416    REFERRING DIAG: BPPV/unsteadiness  THERAPY DIAG:  Muscle weakness (generalized)  Unsteadiness on feet  Difficulty in walking, not elsewhere classified  Rationale for Evaluation and Treatment Rehabilitation  PERTINENT HISTORY: Pt has a complex medical history. 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 He states that he fell 10-12 feet and struck his head, neck, and back. He was able to finish the course which took him approximately 45 minutes more to complete. He does endorse a second fall while finishing the course but did not suffer a second head injury. He states that he started getting headaches that day which have persisted since that time. 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 He does report improvement in his headaches recently. Neurology was concerned about possible BPPV and have referred him for a vestibular evaluation. In addition since the injury, pt developed RUE and RLE pain and weakness. Pt states that NCV showed abnormal nerve conduction in RUE. He has since undergone a C7-7 ACDF on 01/23/20. He reports initial improvement in RUE strength, but states that he has a gradual return of his RUE weakness. He was also having significant RLE weakness and pain and underwent and L5-S1 anterior lumbar interbody fusion on 03/25/20. Patient reports that he does not have any back precautions but needs to wear the low back brace for one more month. Patient reports he has a follow-up appointment with the surgeon next month. Patient reports he has a follow-up appointment with Dr. Brigitte Pulse, neurologist, in August. He arrives to therapy ambulating with an upright rollator walker. Pt denies any mention of concussion after his injury. He  has been having cognitive issues since his injury and has previously had a heavy metals screen as well as neurocognitive testing. He has had an MRI of his brain with and without contrast on 12/18/19. Results were mildly motion degraded examination. No evidence of acute intracranial abnormality. Minimal chronic small vessel ischemic disease. He has tremor in both his hands. He has a positive family history of Parkinson's disease in his grandfather. He is complaining of constant dizziness and unsteadiness which are worse with activity. Patient states that he frequently loses his balance and his wife has to steady him.  PRECAUTIONS: Falls  SUBJECTIVE:Patient goes back to the doctor tomorrow for his back. Reports the heat is bothering him.   PAIN:  Are you having pain? Yes: NPRS scale: 3/10 Pain location: head Pain description: migraine Aggravating factors: turning head, weather Relieving factors: medicine, dark room     TODAY'S TREATMENT:   RUE and RLE strength  Right Left  Shoulder Flexion 3+ 4  Shoulder Abduction 4- 4+  ER 4- 5  IR 4 5  bicep 4 5  tricep 4- 5     Right Left  Hip flexion 4 4+  Hip Abduction 4- 5  Hip Adduction 4- 5  Knee Extension  4 5  Knee Flexion 3+ 5  DF 3 5  PF 4- 5     Treatment:   Seated with heat pad behind back: 5lb ankle weights: -LAQ 15x each LE -march 15x each LE -alternating step outs 15x each LE -heel raise 15x Blue theraband: -row 15x -bicep curl stepping on band 15x Adduction 15x Abduction 15x  Ambulate 500 ft with CGA and walking stick. One near LOB with increased nausea. Car transfer with CGA and min a for RLE to get into car.    PATIENT EDUCATION: Education details: exercise technique, body mechanics Person educated: Patient Education method: Explanation, Demonstration, Tactile cues, and Verbal cues Education comprehension: verbalized understanding, returned demonstration, verbal cues required, and tactile cues  required   HOME EXERCISE PROGRAM: See prior session    PT Short Term Goals       PT SHORT TERM GOAL #1   Title Patient will be independent in home exercise program to improve strength/mobility for better functional independence with ADLs and for self-management.    Baseline 3/3 HEP compliant 3/31 hep compliant    Time 6    Period Weeks    Status Achieved    Target Date 02/20/21      PT SHORT TERM GOAL #2   Title Patient will be modified independent in walking on even/uneven surface with least restrictive assistive device, for 10+ minutes without rest break, reporting some difficulty or less to improve walking tolerance with community ambulation including grocery shopping, going to church,etc.    Baseline 02/03: instability, LOB multiple times while ambulating to elevator 3/31: unable to test due to throwing up 6/20: 6 minutes 7/12: unable to test due to migraine 9/12: defer to next session 11/15: unable to test due to migraine 3/2: requires heavy use of AD and seated rest breaks 4/20: walked around yard yesterday    Time 6    Period Weeks    Status Partially Met    Target Date 03/19/22      PT SHORT TERM GOAL #3   Title Patient will increase ABC scale score >80% to demonstrate better functional mobility and better confidence with ADLs.    Baseline 01/09/21: 60.625% 3/3/: 50% 3/31: 63% 6/20: 45% 7/25: 40% 9/12: 51.1% 11/15: 48% 1/17: 40% 3/2: 33% 4/20: 39% 6/6: 51%    Time 6    Period Weeks    Status Partially Met    Target Date 06/23/22              PT Long Term Goals      PT LONG TERM GOAL #1   Title Patient will increase FOTO score to equal to or greater than 65 to demonstrate statistically significant improvement in mobility and quality of life.    Baseline scored 41/100 on 05/21/2020; 7/22 40, 8/23: 60/100, 09/30/20=40, 01/09/21: 46 3/3: 40.3% 3/31: 49% 6/20: 45% 7/12: 50.7% 9/12: 44.7% 11/15: 40% 1/17: 40% 3/2: 42%    Time 12    Period Weeks    Status Not Met     Target Date 10/20/22       PT LONG TERM GOAL #2   Title Patient will reduce falls risk as indicated by decreased TUG time to less than 11 seconds.    Baseline scored 37.9 sec with TUG on 05/21/2020; 21seconds with elevated rollator 7/22, 8/23: 13.62 sec with up and go walker, 01/09/21: 15.81s with hiking stick 3/3: 17.98 seconds one near LOB 3/31: 15.57 seconds with walking stick 6/20: 14.8  seconds with walking stick. 7/25: 19.72 seconds with walking stick. 9/26: 17.6 seconds 11/15: unable to test due to migraine 1/17: deferred due to pain 3/2:19.25 seconds with walking stick in involved hand 4/20: deferred due to migraine and back pain 6/6: 10.5 seconds with walking stick    Time 12    Period Weeks    Status Achieved    Target Date 04/30/22      PT LONG TERM GOAL #3   Title Patient will increase right UE and LE gross strength to 4+/5 throughout to improve functional strength for independent gait, increased standing tolerance and increased ADL ability.    Baseline grossly +3/5 to -4/5 right UE and LE strength on 05/21/2020; grossly 4-/5 for RUE, grossly 4-/5 for RLE on 7/22, grossly 4-/5 RUE, grossly 4/5 RLE 3/3: see note 3/2: see note 4/20: RLE 4-/5 with quad 4/5 RUE 4/5 with bicep 3+/5 6/6: see note: 7/25: see note   Time 12    Period Weeks    Status Partially Met    Target Date 10/20/22     PT LONG TERM GOAL #4   Title Patient will increase 10 meter walk test to >1.60ms as to improve gait speed for better community ambulation and to reduce fall risk.    Baseline on 7/22 .57 m/sself selected, fast  .73 m/s with LOB pt reported R knee buckling, elevated rollator used, 8/23: 1.06 m/s with up and go walker, 01/09/21: 0.72 m/s with hiking stick 3/3: 0.74 m/s with walking stick 3/31: 0.81 m/s w walking stick 6/20: 1.1 m/s with walking stick    Time 12    Period Weeks    Status Achieved      PT LONG TERM GOAL #5   Title Patient will increase Berg Balance score by > 6 points to demonstrate  decreased fall risk during functional activities.    Baseline 8/23: 37/56, 09/30/20=40/56, 12/20: 42/56 3/3: deferred 3/31 will perform next time; deferred due to throwing up 6/20: 46/56 7/12 38/56    Time 8    Period Weeks    Status Achieved      Additional Long Term Goals   Additional Long Term Goals Yes      PT LONG TERM GOAL #6   Title Patient (< 651years old) will complete five times sit to stand test in < 10 seconds indicating an increased LE strength and improved balance.    Baseline 8/23: 22 sec, 09/30/20=13.04 sec, 12/20: 27.05 sec, 01/09/21: 18.01s 3/3: 23 seconds no UE support 3/31; deferred due to patient throwing up 7/12: 15.2 seconds 11/15: 26 seconds 1/17: deferred due to pain 3/2: 17.5 seconds 4/20: deferred due to migraine and back pain 6/6: 11 seconds with SUE support 8/22: 13.7 seconds with SUE support    Time 12    Period Weeks    Status Partially Met    Target Date 10/20/22     PT LONG TERM GOAL #7   Title Patient will improve 6 min walk test >1000 feet with LRAD for improved gait ability in community.    Baseline 8/23: not tested; 8/30 845, goal adjusted >20064f 09/30/20=600 feet - stopped test due to R ankle catching making gait unsafe, 11/25/20= 330 feet - stopped test due to severe dizziness, vomiting/nausea, sweating making gait unsafe; 01/09/21: 720 ft with no rest breaks 3/3: deferred due to nausea 3/31: deferred due to patient throwing up 6/20: 640 ft 7/12: [id number] walking stick, no rest breaks; 9/26: 1000 ft  Time 12    Period Weeks    Status Achieved      PT LONG TERM GOAL #8   Title Patient will increase Berg Balance score by > 6 points  (52/56) to demonstrate decreased fall risk during functional activities.    Baseline 6/20: 46/56 7/12: 38/56 with migraine 9/12: defer to next session 11/15:  unable to test due to migraine 1/17: deferred due to pain 3/2: next session 4/20: deferred due to migraine and back pain    Time 12    Period Weeks    Status  Partially Met    Target Date 10/20/22     PT LONG TERM GOAL  #9   TITLE Patient will report decreased frequency of falling per week to 1x/week for decreased fall risk and improved quality of life    Baseline 12/1: 4 falls minimum a week 1/17: 1 fall last week 3/2: has been bed bound the week prior 4/20: 2-3x/week 6/6: 2x/week 7/25: 2x/week   Time 12    Period Weeks    Status Partially Met    Target Date 08/04/22      PT LONG TERM GOAL  #10   TITLE Patient will turn head 75% of the time without dizziness or LOB.    Baseline 6/6: patient falls and has extreme dizziness resulting in throwing up. 7/25: 25% of the time   Time 12    Period Weeks    Status Partially Met   Target Date 10/20/22     PT LONG TERM GOAL  #11   TITLE Patient will report a worst pain of 4/10 on VAS in cervical and lumbar  to improve tolerance with ADLs and reduced symptoms with activities.    Baseline 6/6: lumbar: 10/10, cervical 8/10 7/25: lumbar 6-7/10; cervical 5-6/10    Time 12    Period Weeks    Status Partially Met   Target Date 10/20/22               Plan     Clinical Impression Statement Patient to see physician tomorrow about back pain. Will wait for goals until after appointment due to pain limiting mobility. Patient remains highly motivated despite his pain levels. He is able to tolerate seated strengthening with minimal back pain increase.   Patient will benefit from therapy to reduce fall risk, improve strength, and improve capacity for functional mobility for improved quality of life    Personal Factors and Comorbidities Comorbidity 3+;Time since onset of injury/illness/exacerbation    Comorbidities anxiety, back pain, COPD, headache, HTN, MI, sleep apnea, migraines    Examination-Activity Limitations Locomotion Level;Transfers;Bed Mobility;Stand;Stairs;Sleep;Squat;Bend    Examination-Participation Restrictions Driving;Medication Management    Stability/Clinical Decision Making  Unstable/Unpredictable    Rehab Potential Fair    PT Frequency 1x / week    PT Duration 12 weeks    PT Treatment/Interventions ADLs/Self Care Home Management;Canalith Repostioning;Moist Heat;Electrical Stimulation;Gait training;Stair training;Functional mobility training;Therapeutic activities;Therapeutic exercise;Balance training;Neuromuscular re-education;Patient/family education;Manual techniques;Passive range of motion;Vestibular    PT Next Visit Plan Continue with functional mobility training and strengthening;    PT Home Exercise Plan Medbridge Access Code: [email or web address]    Consulted and Agree with Plan of Care Patient;Family member/caregiver    Family Member Consulted Wife               Janna Arch, [place] 08/26/2022, 7:44 AM

## 2022-07-30 ENCOUNTER — Ambulatory Visit: Payer: BC Managed Care – PPO

## 2022-08-04 ENCOUNTER — Ambulatory Visit: Payer: BC Managed Care – PPO

## 2022-08-06 ENCOUNTER — Ambulatory Visit: Payer: BC Managed Care – PPO

## 2022-08-11 ENCOUNTER — Ambulatory Visit: Payer: BC Managed Care – PPO | Attending: Neurology

## 2022-08-11 DIAGNOSIS — R262 Difficulty in walking, not elsewhere classified: Secondary | ICD-10-CM | POA: Insufficient documentation

## 2022-08-11 DIAGNOSIS — R2681 Unsteadiness on feet: Secondary | ICD-10-CM | POA: Insufficient documentation

## 2022-08-11 DIAGNOSIS — M25642 Stiffness of left hand, not elsewhere classified: Secondary | ICD-10-CM | POA: Diagnosis present

## 2022-08-11 DIAGNOSIS — M6281 Muscle weakness (generalized): Secondary | ICD-10-CM | POA: Diagnosis present

## 2022-08-11 DIAGNOSIS — R208 Other disturbances of skin sensation: Secondary | ICD-10-CM | POA: Diagnosis present

## 2022-08-11 DIAGNOSIS — R42 Dizziness and giddiness: Secondary | ICD-10-CM | POA: Diagnosis present

## 2022-08-11 NOTE — Therapy (Signed)
OUTPATIENT PHYSICAL THERAPY TREATMENT NOTE   Patient Name: Cory Espinoza MRN: [id number] DOB:[birth year], 51 y.o., male Today's Date: 08/11/2022  PCP: [place] Pink MD REFERRING PROVIDER: Jennings Books MD    PT End of Session - 08/11/22 1621     Visit Number 96    Number of Visits 39    Date for PT Re-Evaluation 08/04/22    Authorization Type [employer] PPO;    PT Start Time 1604    PT Stop Time 6151    PT Time Calculation (min) 40 min    Equipment Utilized During Treatment Gait belt    Activity Tolerance Patient tolerated treatment well;No increased pain;Patient limited by fatigue    Behavior During Therapy [facility] for tasks assessed/performed                 Past Medical History:  Diagnosis Date   Allergy    Anginal pain (HCC)    Anxiety    Arthritis    lumbar spine   Asthma    Atrial fibrillation (HCC)    Back pain    Severe Lumbar Pain   CHF (congestive heart failure) (HCC)    COPD (chronic obstructive pulmonary disease) (HCC)    Restrictive lung disease   Coronary artery disease    Dyspnea    Dysrhythmia    afib   GERD (gastroesophageal reflux disease)    Headache    [name], onset October 2020, cluster headaches in the past   History of kidney stones    Hyperlipidemia    Hypertension    Myocardial infarction ([place])    at age 63   Pneumonia    Restrictive lung disease    Sleep apnea    unable to use cpap since onset of migraines   Past Surgical History:  Procedure Laterality Date   ABDOMINAL EXPOSURE N/A 03/25/2020   Procedure: ABDOMINAL EXPOSURE;  Surgeon: Rosetta Posner, MD;  Location: [facility] OR;  Service: Vascular;  Laterality: N/A;   ANTERIOR CERVICAL DECOMP/DISCECTOMY FUSION N/A 01/23/2020   Procedure: ANTERIOR CERVICAL DECOMPRESSION FUSION - CERVICAL SIX-CERVICAL SEVEN;  Surgeon: Earnie Larsson, MD;  Location: [place];  Service: Neurosurgery;  Laterality: N/A;   ANTERIOR LUMBAR FUSION N/A 03/25/2020   Procedure: ANTERIOR LUMBAR INTERBODY FUSION  LUMBAR FIVE-SACRAL ONE.;  Surgeon: Earnie Larsson, MD;  Location: [place];  Service: Neurosurgery;  Laterality: N/A;  anterior   BACK SURGERY  1996   CARDIAC CATHETERIZATION Left 04/27/2016   Procedure: Left Heart Cath and Coronary Angiography;  Surgeon: Corey Skains, MD;  Location: [place] CV LAB;  Service: Cardiovascular;  Laterality: Left;   CARDIAC CATHETERIZATION N/A 04/27/2016   Procedure: Intravascular Pressure Wire/FFR Study;  Surgeon: Yolonda Kida, MD;  Location: [place] CV LAB;  Service: Cardiovascular;  Laterality: N/A;   CATHETERIZATION OF PULMONARY ARTERY WITH RETRIEVAL OF FOREIGN BODY Bilateral 04/07/2011   heart   DEGENERATIVE SPONDYLOLISTHESIS     KNEE ARTHROSCOPY Bilateral 2004   LUMBAR DISC SURGERY  1999   RADICULOPATHY, CERVICAL REGION     TONSILLECTOMY     Patient Active Problem List   Diagnosis Date Noted   [name] (coronary artery disease) 10/21/2021   Degenerative spondylolisthesis 03/25/2020   Physical deconditioning 03/04/2020   Lobar pneumonia, unspecified organism ([place]) 03/04/2020   Intractable hemiplegic migraine with status migrainosus 03/02/2020   Current tobacco use 02/05/2020   Abnormal findings on diagnostic imaging of lung 02/05/2020   Shortness of breath 02/05/2020   Herniated disc, cervical 01/23/2020  Cervical spondylosis with myelopathy and radiculopathy 01/23/2020   Pre-operative respiratory examination 01/12/2020   Weakness on right side of face 12/22/2019   Osteoarthritis of spine with radiculopathy, lumbar region 12/11/2019   Intractable migraine with aura with status migrainosus 11/09/2019   Former smoker 08/27/2018   Benign essential HTN 08/27/2018   Cough productive of purulent sputum 11/08/2017   GERD (gastroesophageal reflux disease) 05/06/2017   Contusion of hand 04/22/2017   BP (high blood pressure) 04/22/2017   Oxygen desaturation 04/22/2017   Prostate lump 04/22/2017   Unstable angina ([place]) 04/27/2016   Chest pain  04/24/2016   Morbid obesity ([place]) 01/29/2016   Elevated ALT measurement 10/30/2015   Nonspecific elevation of levels of transaminase and lactic acid dehydrogenase (ldh) 10/30/2015   Reduced libido 10/29/2015   Hyperlipidemia 08/06/2015   Anxiety 08/06/2015   Atherosclerosis of coronary artery 08/06/2015   Childhood asthma 08/06/2015   Chronic obstructive pulmonary disease ([place]) 08/06/2015   OSA (obstructive sleep apnea) 08/06/2015   Anxiety disorder 08/06/2015   Atherosclerosis of native coronary artery of native heart with stable angina pectoris ([place]) 89/21/1941   Uncomplicated asthma 74/07/1447    REFERRING DIAG: BPPV/unsteadiness  THERAPY DIAG:  Muscle weakness (generalized)  Unsteadiness on feet  Difficulty in walking, not elsewhere classified  Dizziness and giddiness  Other disturbances of skin sensation  Stiffness of left hand, not elsewhere classified  Rationale for Evaluation and Treatment Rehabilitation  PERTINENT HISTORY: Pt has a complex medical history. 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 He states that he fell 10-12 feet and struck his head, neck, and back. He was able to finish the course which took him approximately 45 minutes more to complete. He does endorse a second fall while finishing the course but did not suffer a second head injury. He states that he started getting headaches that day which have persisted since that time. 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 He does report improvement in his headaches recently. Neurology was concerned about possible BPPV and have referred him for a vestibular evaluation. In addition since the injury, pt developed RUE and RLE pain and weakness. Pt states that NCV showed abnormal nerve conduction in RUE. He has since undergone a C7-7 ACDF on 01/23/20. He reports initial improvement in RUE strength, but  states that he has a gradual return of his RUE weakness. He was also having significant RLE weakness and pain and underwent and L5-S1 anterior lumbar interbody fusion on 03/25/20. Patient reports that he does not have any back precautions but needs to wear the low back brace for one more month. Patient reports he has a follow-up appointment with the surgeon next month. Patient reports he has a follow-up appointment with Dr. Brigitte Pulse, neurologist, in August. He arrives to therapy ambulating with an upright rollator walker. Pt denies any mention of concussion after his injury. He has been having cognitive issues since his injury and has previously had a heavy metals screen as well as neurocognitive testing. He has had an MRI of his brain with and without contrast on 12/18/19. Results were mildly motion degraded examination. No evidence of acute intracranial abnormality. Minimal chronic small vessel ischemic disease. He has tremor in both his hands. He has a positive family history of Parkinson's disease in his grandfather. He is complaining of constant dizziness and unsteadiness which are worse with activity. Patient states that  he frequently loses his balance and his wife has to steady him.  PRECAUTIONS: Falls  SUBJECTIVE:  Pt back from recent hiatus after rough week with migraine issues. Pt reports last seizure was 07/29/22. Pain well controlled today, energy levels fairly good.     PAIN:  Are you having pain? Yes: NPRS scale: 3/10 Pain location: head Pain description: migraine Aggravating factors: turning head, weather Relieving factors: medicine, dark room     TODAY'S TREATMENT:  *kept in dark treatment room for bulk of session due to migraine luminophobia *adjusted resistance to reflect RUE weakness deficits  Seated  5lb ankle weights: -LAQ 2x15  -march 2x15 -heel raise 2x20  -Left ankle DF 2x20   -Standing Black TB row1x15  -Seated bicep curl stepping on band 2x10 -Seated Shoulder abdct  1x15 BTB bilat  -Standing Black TB row1x15  -Seated bicep curl stepping on band 2x10 -Seated Shoulder abdct 1x15 BTB bilat     PATIENT EDUCATION: Education details: exercise technique, body mechanics Person educated: Patient Education method: Explanation, Demonstration, Tactile cues, and Verbal cues Education comprehension: verbalized understanding, returned demonstration, verbal cues required, and tactile cues required   HOME EXERCISE PROGRAM: See prior session    PT Short Term Goals       PT SHORT TERM GOAL #1   Title Patient will be independent in home exercise program to improve strength/mobility for better functional independence with ADLs and for self-management.    Baseline 3/3 HEP compliant 3/31 hep compliant    Time 6    Period Weeks    Status Achieved    Target Date 02/20/21      PT SHORT TERM GOAL #2   Title Patient will be modified independent in walking on even/uneven surface with least restrictive assistive device, for 10+ minutes without rest break, reporting some difficulty or less to improve walking tolerance with community ambulation including grocery shopping, going to church,etc.    Baseline 02/03: instability, LOB multiple times while ambulating to elevator 3/31: unable to test due to throwing up 6/20: 6 minutes 7/12: unable to test due to migraine 9/12: defer to next session 11/15: unable to test due to migraine 3/2: requires heavy use of AD and seated rest breaks 4/20: walked around yard yesterday    Time 6    Period Weeks    Status Partially Met    Target Date 03/19/22      PT SHORT TERM GOAL #3   Title Patient will increase ABC scale score >80% to demonstrate better functional mobility and better confidence with ADLs.    Baseline 01/09/21: 60.625% 3/3/: 50% 3/31: 63% 6/20: 45% 7/25: 40% 9/12: 51.1% 11/15: 48% 1/17: 40% 3/2: 33% 4/20: 39% 6/6: 51%    Time 6    Period Weeks    Status Partially Met    Target Date 06/23/22              PT Long  Term Goals      PT LONG TERM GOAL #1   Title Patient will increase FOTO score to equal to or greater than 65 to demonstrate statistically significant improvement in mobility and quality of life.    Baseline scored 41/100 on 05/21/2020; 7/22 40, 8/23: 60/100, 09/30/20=40, 01/09/21: 46 3/3: 40.3% 3/31: 49% 6/20: 45% 7/12: 50.7% 9/12: 44.7% 11/15: 40% 1/17: 40% 3/2: 42%    Time 12    Period Weeks    Status Not Met    Target Date 04/30/22      PT LONG  TERM GOAL #2   Title Patient will reduce falls risk as indicated by decreased TUG time to less than 11 seconds.    Baseline scored 37.9 sec with TUG on 05/21/2020; 21seconds with elevated rollator 7/22, 8/23: 13.62 sec with up and go walker, 01/09/21: 15.81s with hiking stick 3/3: 17.98 seconds one near LOB 3/31: 15.57 seconds with walking stick 6/20: 14.8 seconds with walking stick. 7/25: 19.72 seconds with walking stick. 9/26: 17.6 seconds 11/15: unable to test due to migraine 1/17: deferred due to pain 3/2:19.25 seconds with walking stick in involved hand 4/20: deferred due to migraine and back pain 6/6: 10.5 seconds with walking stick    Time 12    Period Weeks    Status Achieved    Target Date 04/30/22      PT LONG TERM GOAL #3   Title Patient will increase right UE and LE gross strength to 4+/5 throughout to improve functional strength for independent gait, increased standing tolerance and increased ADL ability.    Baseline grossly +3/5 to -4/5 right UE and LE strength on 05/21/2020; grossly 4-/5 for RUE, grossly 4-/5 for RLE on 7/22, grossly 4-/5 RUE, grossly 4/5 RLE 3/3: see note 3/2: see note 4/20: RLE 4-/5 with quad 4/5 RUE 4/5 with bicep 3+/5 6/6: see note: 7/25: see note   Time 12    Period Weeks    Status Partially Met    Target Date 08/04/22      PT LONG TERM GOAL #4   Title Patient will increase 10 meter walk test to >1.45ms as to improve gait speed for better community ambulation and to reduce fall risk.    Baseline on 7/22 .57  m/sself selected, fast  .73 m/s with LOB pt reported R knee buckling, elevated rollator used, 8/23: 1.06 m/s with up and go walker, 01/09/21: 0.72 m/s with hiking stick 3/3: 0.74 m/s with walking stick 3/31: 0.81 m/s w walking stick 6/20: 1.1 m/s with walking stick    Time 12    Period Weeks    Status Achieved      PT LONG TERM GOAL #5   Title Patient will increase Berg Balance score by > 6 points to demonstrate decreased fall risk during functional activities.    Baseline 8/23: 37/56, 09/30/20=40/56, 12/20: 42/56 3/3: deferred 3/31 will perform next time; deferred due to throwing up 6/20: 46/56 7/12 38/56    Time 8    Period Weeks    Status Achieved      Additional Long Term Goals   Additional Long Term Goals Yes      PT LONG TERM GOAL #6   Title Patient (< 639years old) will complete five times sit to stand test in < 10 seconds indicating an increased LE strength and improved balance.    Baseline 8/23: 22 sec, 09/30/20=13.04 sec, 12/20: 27.05 sec, 01/09/21: 18.01s 3/3: 23 seconds no UE support 3/31; deferred due to patient throwing up 7/12: 15.2 seconds 11/15: 26 seconds 1/17: deferred due to pain 3/2: 17.5 seconds 4/20: deferred due to migraine and back pain 6/6: 11 seconds with SUE support 8/22: 13.7 seconds with SUE support    Time 12    Period Weeks    Status Partially Met    Target Date 08/04/22      PT LONG TERM GOAL #7   Title Patient will improve 6 min walk test >1000 feet with LRAD for improved gait ability in community.    Baseline 8/23: not tested; 8/30  845, goal adjusted >2031f, 09/30/20=600 feet - stopped test due to R ankle catching making gait unsafe, 11/25/20= 330 feet - stopped test due to severe dizziness, vomiting/nausea, sweating making gait unsafe; 01/09/21: 720 ft with no rest breaks 3/3: deferred due to nausea 3/31: deferred due to patient throwing up 6/20: 640 ft 7/12: [id number] walking stick, no rest breaks; 9/26: 1000 ft    Time 12    Period Weeks    Status  Achieved      PT LONG TERM GOAL #8   Title Patient will increase Berg Balance score by > 6 points  (52/56) to demonstrate decreased fall risk during functional activities.    Baseline 6/20: 46/56 7/12: 38/56 with migraine 9/12: defer to next session 11/15:  unable to test due to migraine 1/17: deferred due to pain 3/2: next session 4/20: deferred due to migraine and back pain    Time 12    Period Weeks    Status Partially Met    Target Date 08/04/22      PT LONG TERM GOAL  #9   TITLE Patient will report decreased frequency of falling per week to 1x/week for decreased fall risk and improved quality of life    Baseline 12/1: 4 falls minimum a week 1/17: 1 fall last week 3/2: has been bed bound the week prior 4/20: 2-3x/week 6/6: 2x/week 7/25: 2x/week   Time 12    Period Weeks    Status Partially Met    Target Date 08/04/22      PT LONG TERM GOAL  #10   TITLE Patient will turn head 75% of the time without dizziness or LOB.    Baseline 6/6: patient falls and has extreme dizziness resulting in throwing up. 7/25: 25% of the time   Time 12    Period Weeks    Status Partially Met   Target Date 08/04/22      PT LONG TERM GOAL  #11   TITLE Patient will report a worst pain of 4/10 on VAS in cervical and lumbar  to improve tolerance with ADLs and reduced symptoms with activities.    Baseline 6/6: lumbar: 10/10, cervical 8/10 7/25: lumbar 6-7/10; cervical 5-6/10    Time 12    Period Weeks    Status Partially Met   Target Date 08/04/22               Plan     Clinical Impression Statement Good day for patient, less severe CNS symptoms, he is motivated to work extra hard and continue to improve strength. A few LOB while transitioning  between . Pt asks about returning focus to vertigo, but auPryor Curiaefers discussion to primary therapist given the chronicity and complexity of this ongoing problem. Patient will benefit from therapy to reduce fall risk, improve strength, and improve capacity  for functional mobility for improved quality of life    Personal Factors and Comorbidities Comorbidity 3+;Time since onset of injury/illness/exacerbation    Comorbidities anxiety, back pain, COPD, headache, HTN, MI, sleep apnea, migraines    Examination-Activity Limitations Locomotion Level;Transfers;Bed Mobility;Stand;Stairs;Sleep;Squat;Bend    Examination-Participation Restrictions Driving;Medication Management    Stability/Clinical Decision Making Unstable/Unpredictable    Rehab Potential Fair    PT Frequency 1x / week    PT Duration 12 weeks    PT Treatment/Interventions ADLs/Self Care Home Management;Canalith Repostioning;Moist Heat;Electrical Stimulation;Gait training;Stair training;Functional mobility training;Therapeutic activities;Therapeutic exercise;Balance training;Neuromuscular re-education;Patient/family education;Manual techniques;Passive range of motion;Vestibular    PT Next Visit Plan Continue with functional  mobility training and strengthening;    PT [place] Access Code: [email or web address]    Consulted and Agree with Plan of Care Patient;Family member/caregiver    Family Member Consulted Wife             4:25 PM, 08/11/22 Etta Grandchild, PT, DPT Physical Therapist - [facility]  Outpatient Physical Therapy- [place] [phone]      [place], PT 08/11/2022, 4:25 PM

## 2022-08-18 ENCOUNTER — Ambulatory Visit: Payer: BC Managed Care – PPO

## 2022-08-25 ENCOUNTER — Ambulatory Visit: Payer: BC Managed Care – PPO

## 2022-08-25 DIAGNOSIS — R262 Difficulty in walking, not elsewhere classified: Secondary | ICD-10-CM

## 2022-08-25 DIAGNOSIS — M6281 Muscle weakness (generalized): Secondary | ICD-10-CM

## 2022-08-25 DIAGNOSIS — R2681 Unsteadiness on feet: Secondary | ICD-10-CM

## 2022-08-26 NOTE — Therapy (Signed)
OUTPATIENT PHYSICAL THERAPY TREATMENT NOTE   Patient Name: Cory Espinoza MRN: [id number] DOB:31-Jan-1971, 51 y.o., male Today's Date: 08/26/2022  PCP: [place] Pink MD REFERRING PROVIDER: Jennings Books MD          Past Medical History:  Diagnosis Date   Allergy    Anginal pain [facility])    Anxiety    Arthritis    lumbar spine   Asthma    Atrial fibrillation ([place])    Back pain    Severe Lumbar Pain   CHF (congestive heart failure) (HCC)    COPD (chronic obstructive pulmonary disease) ([place])    Restrictive lung disease   Coronary artery disease    Dyspnea    Dysrhythmia    afib   GERD (gastroesophageal reflux disease)    Headache    [name], onset October 2020, cluster headaches in the past   History of kidney stones    Hyperlipidemia    Hypertension    Myocardial infarction ([place])    at age 45   Pneumonia    Restrictive lung disease    Sleep apnea    unable to use cpap since onset of migraines   Past Surgical History:  Procedure Laterality Date   ABDOMINAL EXPOSURE N/A 03/25/2020   Procedure: ABDOMINAL EXPOSURE;  Surgeon: Rosetta Posner, MD;  Location: [facility] OR;  Service: Vascular;  Laterality: N/A;   ANTERIOR CERVICAL DECOMP/DISCECTOMY FUSION N/A 01/23/2020   Procedure: ANTERIOR CERVICAL DECOMPRESSION FUSION - CERVICAL SIX-CERVICAL SEVEN;  Surgeon: Earnie Larsson, MD;  Location: [place];  Service: Neurosurgery;  Laterality: N/A;   ANTERIOR LUMBAR FUSION N/A 03/25/2020   Procedure: ANTERIOR LUMBAR INTERBODY FUSION LUMBAR FIVE-SACRAL ONE.;  Surgeon: Earnie Larsson, MD;  Location: [place];  Service: Neurosurgery;  Laterality: N/A;  anterior   BACK SURGERY  1996   CARDIAC CATHETERIZATION Left 04/27/2016   Procedure: Left Heart Cath and Coronary Angiography;  Surgeon: Corey Skains, MD;  Location: [place] [facility];  Service: Cardiovascular;  Laterality: Left;   CARDIAC CATHETERIZATION N/A 04/27/2016   Procedure: Intravascular Pressure Wire/FFR Study;  Surgeon: Yolonda Kida, MD;  Location: [place] [facility];  Service: Cardiovascular;  Laterality: N/A;   CATHETERIZATION OF PULMONARY ARTERY WITH RETRIEVAL OF FOREIGN BODY Bilateral 04/07/2011   heart   DEGENERATIVE SPONDYLOLISTHESIS     KNEE ARTHROSCOPY Bilateral 2004   LUMBAR DISC SURGERY  1999   RADICULOPATHY, CERVICAL REGION     TONSILLECTOMY     Patient Active Problem List   Diagnosis Date Noted   [name] (coronary artery disease) 10/21/2021   Degenerative spondylolisthesis 03/25/2020   Physical deconditioning 03/04/2020   Lobar pneumonia, unspecified organism ([place]) 03/04/2020   Intractable hemiplegic migraine with status migrainosus 03/02/2020   Current tobacco use 02/05/2020   Abnormal findings on diagnostic imaging of lung 02/05/2020   Shortness of breath 02/05/2020   Herniated disc, cervical 01/23/2020   Cervical spondylosis with myelopathy and radiculopathy 01/23/2020   Pre-operative respiratory examination 01/12/2020   Weakness on right side of face 12/22/2019   Osteoarthritis of spine with radiculopathy, lumbar region 12/11/2019   Intractable migraine with aura with status migrainosus 11/09/2019   Former smoker 08/27/2018   Benign essential HTN 08/27/2018   Cough productive of purulent sputum 11/08/2017   GERD (gastroesophageal reflux disease) 05/06/2017   Contusion of hand 04/22/2017   BP (high blood pressure) 04/22/2017   Oxygen desaturation 04/22/2017   Prostate lump 04/22/2017   Unstable angina (HCC) 04/27/2016   Chest pain 04/24/2016  Morbid obesity ([place]) 01/29/2016   Elevated ALT measurement 10/30/2015   Nonspecific elevation of levels of transaminase and lactic acid dehydrogenase (ldh) 10/30/2015   Reduced libido 10/29/2015   Hyperlipidemia 08/06/2015   Anxiety 08/06/2015   Atherosclerosis of coronary artery 08/06/2015   Childhood asthma 08/06/2015   Chronic obstructive pulmonary disease ([place]) 08/06/2015   OSA (obstructive sleep apnea) 08/06/2015   Anxiety disorder  08/06/2015   Atherosclerosis of native coronary artery of native heart with stable angina pectoris ([place]) 84/13/2440   Uncomplicated asthma 10/03/2535    REFERRING DIAG: BPPV/unsteadiness  THERAPY DIAG:  Muscle weakness (generalized)  Unsteadiness on feet  Difficulty in walking, not elsewhere classified  Rationale for Evaluation and Treatment Rehabilitation  PERTINENT HISTORY: Pt has a complex medical history. He reports that he fell during a [employer] training in [place] in the fall of 2020. He states that he fell 10-12 feet and struck his head, neck, and back. He was able to finish the course which took him approximately 45 minutes more to complete. He does endorse a second fall while finishing the course but did not suffer a second head injury. He states that he started getting headaches that day which have persisted since that time. 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 He does report improvement in his headaches recently. Neurology was concerned about possible BPPV and have referred him for a vestibular evaluation. In addition since the injury, pt developed RUE and RLE pain and weakness. Pt states that NCV showed abnormal nerve conduction in RUE. He has since undergone a C7-7 ACDF on 01/23/20. He reports initial improvement in RUE strength, but states that he has a gradual return of his RUE weakness. He was also having significant RLE weakness and pain and underwent and L5-S1 anterior lumbar interbody fusion on 03/25/20. Patient reports that he does not have any back precautions but needs to wear the low back brace for one more month. Patient reports he has a follow-up appointment with the surgeon next month. Patient reports he has a follow-up appointment with Dr. Brigitte Pulse, neurologist, in August. He arrives to therapy ambulating with an upright rollator walker. Pt denies any mention of concussion after his injury. He has been  having cognitive issues since his injury and has previously had a heavy metals screen as well as neurocognitive testing. He has had an MRI of his brain with and without contrast on 12/18/19. Results were mildly motion degraded examination. No evidence of acute intracranial abnormality. Minimal chronic small vessel ischemic disease. He has tremor in both his hands. He has a positive family history of Parkinson's disease in his grandfather. He is complaining of constant dizziness and unsteadiness which are worse with activity. Patient states that he frequently loses his balance and his wife has to steady him.  PRECAUTIONS: Falls  SUBJECTIVE:Patient requests return to vestibular therapy as his dizziness is primary impairment at this time.   PAIN:  Are you having pain? Yes: NPRS scale: 3/10 Pain location: head Pain description: migraine Aggravating factors: turning head, weather Relieving factors: medicine, dark room     TODAY'S TREATMENT:   RUE and RLE strength  Right Left  Shoulder Flexion 3+ 4  Shoulder Abduction 4- 4+  ER 4- 5  IR 4 5  bicep 4 5  tricep 4- 5     Right Left  Hip flexion 4 4+  Hip Abduction 4- 5  Hip Adduction 4- 5  Knee Extension  4 5  Knee Flexion 3+ 5  DF 3 5  PF 4- 5     Treatment:   Seated with heat pad behind back: GTB: -tricep extension bilaterally 15x -row 15x cue for equal smooth contractions -hamstring curl 15x; 2 sets -lateral stepping 15x each LE 2 sets -March 15x each LE ; 2 sets -abduction 15 x each LE; x2 sets -LAQ 15x each LE x 2 sets -heel raise 15x   Discussion of transition of care to vestibular therapy.   PATIENT EDUCATION: Education details: exercise technique, body mechanics Person educated: Patient Education method: Explanation, Demonstration, Tactile cues, and Verbal cues Education comprehension: verbalized understanding, returned demonstration, verbal cues required, and tactile cues required   HOME EXERCISE  PROGRAM: See prior session    PT Short Term Goals       PT SHORT TERM GOAL #1   Title Patient will be independent in home exercise program to improve strength/mobility for better functional independence with ADLs and for self-management.    Baseline 3/3 HEP compliant 3/31 hep compliant    Time 6    Period Weeks    Status Achieved    Target Date 02/20/21      PT SHORT TERM GOAL #2   Title Patient will be modified independent in walking on even/uneven surface with least restrictive assistive device, for 10+ minutes without rest break, reporting some difficulty or less to improve walking tolerance with community ambulation including grocery shopping, going to church,etc.    Baseline 02/03: instability, LOB multiple times while ambulating to elevator 3/31: unable to test due to throwing up 6/20: 6 minutes 7/12: unable to test due to migraine 9/12: defer to next session 11/15: unable to test due to migraine 3/2: requires heavy use of AD and seated rest breaks 4/20: walked around yard yesterday    Time 6    Period Weeks    Status Partially Met    Target Date 03/19/22      PT SHORT TERM GOAL #3   Title Patient will increase ABC scale score >80% to demonstrate better functional mobility and better confidence with ADLs.    Baseline 01/09/21: 60.625% 3/3/: 50% 3/31: 63% 6/20: 45% 7/25: 40% 9/12: 51.1% 11/15: 48% 1/17: 40% 3/2: 33% 4/20: 39% 6/6: 51%    Time 6    Period Weeks    Status Partially Met    Target Date 06/23/22              PT Long Term Goals      PT LONG TERM GOAL #1   Title Patient will increase FOTO score to equal to or greater than 65 to demonstrate statistically significant improvement in mobility and quality of life.    Baseline scored 41/100 on 05/21/2020; 7/22 40, 8/23: 60/100, 09/30/20=40, 01/09/21: 46 3/3: 40.3% 3/31: 49% 6/20: 45% 7/12: 50.7% 9/12: 44.7% 11/15: 40% 1/17: 40% 3/2: 42%    Time 12    Period Weeks    Status Not Met    Target Date 10/20/22        PT LONG TERM GOAL #2   Title Patient will reduce falls risk as indicated by decreased TUG time to less than 11 seconds.    Baseline scored 37.9 sec with TUG on 05/21/2020; 21seconds with elevated rollator 7/22, 8/23: 13.62 sec with up and go walker, 01/09/21: 15.81s with hiking stick 3/3: 17.98 seconds one near LOB 3/31: 15.57 seconds with walking stick 6/20: 14.8 seconds with walking stick. 7/25:  19.72 seconds with walking stick. 9/26: 17.6 seconds 11/15: unable to test due to migraine 1/17: deferred due to pain 3/2:19.25 seconds with walking stick in involved hand 4/20: deferred due to migraine and back pain 6/6: 10.5 seconds with walking stick    Time 12    Period Weeks    Status Achieved    Target Date 04/30/22      PT LONG TERM GOAL #3   Title Patient will increase right UE and LE gross strength to 4+/5 throughout to improve functional strength for independent gait, increased standing tolerance and increased ADL ability.    Baseline grossly +3/5 to -4/5 right UE and LE strength on 05/21/2020; grossly 4-/5 for RUE, grossly 4-/5 for RLE on 7/22, grossly 4-/5 RUE, grossly 4/5 RLE 3/3: see note 3/2: see note 4/20: RLE 4-/5 with quad 4/5 RUE 4/5 with bicep 3+/5 6/6: see note: 7/25: see note   Time 12    Period Weeks    Status Partially Met    Target Date 10/20/22     PT LONG TERM GOAL #4   Title Patient will increase 10 meter walk test to >1.78ms as to improve gait speed for better community ambulation and to reduce fall risk.    Baseline on 7/22 .57 m/sself selected, fast  .73 m/s with LOB pt reported R knee buckling, elevated rollator used, 8/23: 1.06 m/s with up and go walker, 01/09/21: 0.72 m/s with hiking stick 3/3: 0.74 m/s with walking stick 3/31: 0.81 m/s w walking stick 6/20: 1.1 m/s with walking stick    Time 12    Period Weeks    Status Achieved      PT LONG TERM GOAL #5   Title Patient will increase Berg Balance score by > 6 points to demonstrate decreased fall risk during  functional activities.    Baseline 8/23: 37/56, 09/30/20=40/56, 12/20: 42/56 3/3: deferred 3/31 will perform next time; deferred due to throwing up 6/20: 46/56 7/12 38/56    Time 8    Period Weeks    Status Achieved      Additional Long Term Goals   Additional Long Term Goals Yes      PT LONG TERM GOAL #6   Title Patient (< 656years old) will complete five times sit to stand test in < 10 seconds indicating an increased LE strength and improved balance.    Baseline 8/23: 22 sec, 09/30/20=13.04 sec, 12/20: 27.05 sec, 01/09/21: 18.01s 3/3: 23 seconds no UE support 3/31; deferred due to patient throwing up 7/12: 15.2 seconds 11/15: 26 seconds 1/17: deferred due to pain 3/2: 17.5 seconds 4/20: deferred due to migraine and back pain 6/6: 11 seconds with SUE support 8/22: 13.7 seconds with SUE support    Time 12    Period Weeks    Status Partially Met    Target Date 10/20/22     PT LONG TERM GOAL #7   Title Patient will improve 6 min walk test >1000 feet with LRAD for improved gait ability in community.    Baseline 8/23: not tested; 8/30 845, goal adjusted >20052f 09/30/20=600 feet - stopped test due to R ankle catching making gait unsafe, 11/25/20= 330 feet - stopped test due to severe dizziness, vomiting/nausea, sweating making gait unsafe; 01/09/21: 720 ft with no rest breaks 3/3: deferred due to nausea 3/31: deferred due to patient throwing up 6/20: 640 ft 7/12: [id number] walking stick, no rest breaks; 9/26: 1000 ft    Time 12  Period Weeks    Status Achieved      PT LONG TERM GOAL #8   Title Patient will increase Berg Balance score by > 6 points  (52/56) to demonstrate decreased fall risk during functional activities.    Baseline 6/20: 46/56 7/12: 38/56 with migraine 9/12: defer to next session 11/15:  unable to test due to migraine 1/17: deferred due to pain 3/2: next session 4/20: deferred due to migraine and back pain    Time 12    Period Weeks    Status Partially Met    Target  Date 10/20/22     PT LONG TERM GOAL  #9   TITLE Patient will report decreased frequency of falling per week to 1x/week for decreased fall risk and improved quality of life    Baseline 12/1: 4 falls minimum a week 1/17: 1 fall last week 3/2: has been bed bound the week prior 4/20: 2-3x/week 6/6: 2x/week 7/25: 2x/week   Time 12    Period Weeks    Status Partially Met    Target Date 08/04/22      PT LONG TERM GOAL  #10   TITLE Patient will turn head 75% of the time without dizziness or LOB.    Baseline 6/6: patient falls and has extreme dizziness resulting in throwing up. 7/25: 25% of the time   Time 12    Period Weeks    Status Partially Met   Target Date 10/20/22     PT LONG TERM GOAL  #11   TITLE Patient will report a worst pain of 4/10 on VAS in cervical and lumbar  to improve tolerance with ADLs and reduced symptoms with activities.    Baseline 6/6: lumbar: 10/10, cervical 8/10 7/25: lumbar 6-7/10; cervical 5-6/10    Time 12    Period Weeks    Status Partially Met   Target Date 10/20/22               Plan     Clinical Impression Statement Patient will be transitioning care to vestibular therapy. Patient is at point where he is primarily limited by dizziness and limited rotation. Patient agreeable to change in plan of care.  Patient will benefit from therapy to reduce fall risk, improve strength, and improve capacity for functional mobility for improved quality of life    Personal Factors and Comorbidities Comorbidity 3+;Time since onset of injury/illness/exacerbation    Comorbidities anxiety, back pain, COPD, headache, HTN, MI, sleep apnea, migraines    Examination-Activity Limitations Locomotion Level;Transfers;Bed Mobility;Stand;Stairs;Sleep;Squat;Bend    Examination-Participation Restrictions Driving;Medication Management    Stability/Clinical Decision Making Unstable/Unpredictable    Rehab Potential Fair    PT Frequency 1x / week    PT Duration 12 weeks    PT  Treatment/Interventions ADLs/Self Care Home Management;Canalith Repostioning;Moist Heat;Electrical Stimulation;Gait training;Stair training;Functional mobility training;Therapeutic activities;Therapeutic exercise;Balance training;Neuromuscular re-education;Patient/family education;Manual techniques;Passive range of motion;Vestibular    PT Next Visit Plan Continue with functional mobility training and strengthening;    PT Home Exercise Plan Medbridge Access Code: [email or web address]    Consulted and Agree with Plan of Care Patient;Family member/caregiver    Family Member Consulted Wife               Janna Arch, [place] 08/26/2022, 7:58 AM

## 2022-08-26 NOTE — Addendum Note (Signed)
Addended by: Judene Companion on: 08/26/2022 07:52 AM   Modules accepted: Orders

## 2022-09-01 ENCOUNTER — Ambulatory Visit: Payer: BC Managed Care – PPO

## 2022-09-08 ENCOUNTER — Ambulatory Visit: Payer: BC Managed Care – PPO

## 2022-09-08 NOTE — Therapy (Deleted)
OUTPATIENT PHYSICAL THERAPY TREATMENT NOTE   Patient Name: Cory Espinoza MRN: [id number] DOB:March 02, 1971, 51 y.o., male Today's Date: 09/08/2022  PCP: [place] Pink MD REFERRING PROVIDER: Jennings Books MD          Past Medical History:  Diagnosis Date   Allergy    Anginal pain [facility])    Anxiety    Arthritis    lumbar spine   Asthma    Atrial fibrillation ([place])    Back pain    Severe Lumbar Pain   CHF (congestive heart failure) (HCC)    COPD (chronic obstructive pulmonary disease) ([place])    Restrictive lung disease   Coronary artery disease    Dyspnea    Dysrhythmia    afib   GERD (gastroesophageal reflux disease)    Headache    [name], onset October 2020, cluster headaches in the past   History of kidney stones    Hyperlipidemia    Hypertension    Myocardial infarction ([place])    at age 29   Pneumonia    Restrictive lung disease    Sleep apnea    unable to use cpap since onset of migraines   Past Surgical History:  Procedure Laterality Date   ABDOMINAL EXPOSURE N/A 03/25/2020   Procedure: ABDOMINAL EXPOSURE;  Surgeon: Rosetta Posner, MD;  Location: [facility];  Service: Vascular;  Laterality: N/A;   ANTERIOR CERVICAL DECOMP/DISCECTOMY FUSION N/A 01/23/2020   Procedure: ANTERIOR CERVICAL DECOMPRESSION FUSION - CERVICAL SIX-CERVICAL SEVEN;  Surgeon: Earnie Larsson, MD;  Location: [place];  Service: Neurosurgery;  Laterality: N/A;   ANTERIOR LUMBAR FUSION N/A 03/25/2020   Procedure: ANTERIOR LUMBAR INTERBODY FUSION LUMBAR FIVE-SACRAL ONE.;  Surgeon: Earnie Larsson, MD;  Location: [place];  Service: Neurosurgery;  Laterality: N/A;  anterior   BACK SURGERY  1996   CARDIAC CATHETERIZATION Left 04/27/2016   Procedure: Left Heart Cath and Coronary Angiography;  Surgeon: Corey Skains, MD;  Location: [facility];  Service: Cardiovascular;  Laterality: Left;   CARDIAC CATHETERIZATION N/A 04/27/2016   Procedure: Intravascular Pressure Wire/FFR Study;  Surgeon: Yolonda Kida, MD;  Location: [facility];  Service: Cardiovascular;  Laterality: N/A;   CATHETERIZATION OF PULMONARY ARTERY WITH RETRIEVAL OF FOREIGN BODY Bilateral 04/07/2011   heart   DEGENERATIVE SPONDYLOLISTHESIS     KNEE ARTHROSCOPY Bilateral 2004   LUMBAR DISC SURGERY  1999   RADICULOPATHY, CERVICAL REGION     TONSILLECTOMY     Patient Active Problem List   Diagnosis Date Noted   [name] (coronary artery disease) 10/21/2021   Degenerative spondylolisthesis 03/25/2020   Physical deconditioning 03/04/2020   Lobar pneumonia, unspecified organism ([place]) 03/04/2020   Intractable hemiplegic migraine with status migrainosus 03/02/2020   Current tobacco use 02/05/2020   Abnormal findings on diagnostic imaging of lung 02/05/2020   Shortness of breath 02/05/2020   Herniated disc, cervical 01/23/2020   Cervical spondylosis with myelopathy and radiculopathy 01/23/2020   Pre-operative respiratory examination 01/12/2020   Weakness on right side of face 12/22/2019   Osteoarthritis of spine with radiculopathy, lumbar region 12/11/2019   Intractable migraine with aura with status migrainosus 11/09/2019   Former smoker 08/27/2018   Benign essential HTN 08/27/2018   Cough productive of purulent sputum 11/08/2017   GERD (gastroesophageal reflux disease) 05/06/2017   Contusion of hand 04/22/2017   BP (high blood pressure) 04/22/2017   Oxygen desaturation 04/22/2017   Prostate lump 04/22/2017   Unstable angina (HCC) 04/27/2016   Chest pain 04/24/2016  Morbid obesity ([place]) 01/29/2016   Elevated ALT measurement 10/30/2015   Nonspecific elevation of levels of transaminase and lactic acid dehydrogenase (ldh) 10/30/2015   Reduced libido 10/29/2015   Hyperlipidemia 08/06/2015   Anxiety 08/06/2015   Atherosclerosis of coronary artery 08/06/2015   Childhood asthma 08/06/2015   Chronic obstructive pulmonary disease ([place]) 08/06/2015   OSA (obstructive sleep apnea) 08/06/2015   Anxiety disorder  08/06/2015   Atherosclerosis of native coronary artery of native heart with stable angina pectoris ([place]) 91/63/8466   Uncomplicated asthma 59/93/5701    REFERRING DIAG: BPPV/unsteadiness  THERAPY DIAG:  No diagnosis found.  Rationale for Evaluation and Treatment Rehabilitation  PERTINENT HISTORY: Pt has a complex medical history. 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 He states that he fell 10-12 feet and struck his head, neck, and back. He was able to finish the course which took him approximately 45 minutes more to complete. He does endorse a second fall while finishing the course but did not suffer a second head injury. He states that he started getting headaches that day which have persisted since that time. 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 He does report improvement in his headaches recently. Neurology was concerned about possible BPPV and have referred him for a vestibular evaluation. In addition since the injury, pt developed RUE and RLE pain and weakness. Pt states that NCV showed abnormal nerve conduction in RUE. He has since undergone a C7-7 ACDF on 01/23/20. He reports initial improvement in RUE strength, but states that he has a gradual return of his RUE weakness. He was also having significant RLE weakness and pain and underwent and L5-S1 anterior lumbar interbody fusion on 03/25/20. Patient reports that he does not have any back precautions but needs to wear the low back brace for one more month. Patient reports he has a follow-up appointment with the surgeon next month. Patient reports he has a follow-up appointment with Dr. Brigitte Pulse, neurologist, in August. He arrives to therapy ambulating with an upright rollator walker. Pt denies any mention of concussion after his injury. He has been having cognitive issues since his injury and has previously had a heavy metals screen  as well as neurocognitive testing. He has had an MRI of his brain with and without contrast on 12/18/19. Results were mildly motion degraded examination. No evidence of acute intracranial abnormality. Minimal chronic small vessel ischemic disease. He has tremor in both his hands. He has a positive family history of Parkinson's disease in his grandfather. He is complaining of constant dizziness and unsteadiness which are worse with activity. Patient states that he frequently loses his balance and his wife has to steady him.  PRECAUTIONS: Falls  SUBJECTIVE: Pt returns to PT after brief absence, here for continuing care and vestibular assessment.   Describe your dizziness without using the term "dizzy:"  How long does your vertigo last (seconds, hours, >24 hours)  Can it come on when you're still or do certain movements trigger your symptoms?  Nausea and vomiting?  Any other symptoms associated with your dizziness?   Do you have trouble with going into busy stores, using screens, or being a passenger in a car and seeing other cars go by?  What increases likelihood of a migraine?  What tends to make it more likely you'll have a seizure?  PAIN:  Are you having pain? Yes: NPRS  scale: 3/10 Pain location: head Pain description: migraine Aggravating factors: turning head, weather Relieving factors: medicine, dark room     TODAY'S TREATMENT:     Previous appt: RUE and RLE strength  Right Left  Shoulder Flexion 3+ 4  Shoulder Abduction 4- 4+  ER 4- 5  IR 4 5  bicep 4 5  tricep 4- 5     Right Left  Hip flexion 4 4+  Hip Abduction 4- 5  Hip Adduction 4- 5  Knee Extension  4 5  Knee Flexion 3+ 5  DF 3 5  PF 4- 5      PATIENT EDUCATION: Education details: exercise technique, body mechanics Person educated: Patient Education method: Explanation, Demonstration, Tactile cues, and Verbal cues Education comprehension: verbalized understanding, returned demonstration, verbal cues  required, and tactile cues required   HOME EXERCISE PROGRAM: See prior session    PT Short Term Goals       PT SHORT TERM GOAL #1   Title Patient will be independent in home exercise program to improve strength/mobility for better functional independence with ADLs and for self-management.    Baseline 3/3 HEP compliant 3/31 hep compliant    Time 6    Period Weeks    Status Achieved    Target Date 02/20/21      PT SHORT TERM GOAL #2   Title Patient will be modified independent in walking on even/uneven surface with least restrictive assistive device, for 10+ minutes without rest break, reporting some difficulty or less to improve walking tolerance with community ambulation including grocery shopping, going to church,etc.    Baseline 02/03: instability, LOB multiple times while ambulating to elevator 3/31: unable to test due to throwing up 6/20: 6 minutes 7/12: unable to test due to migraine 9/12: defer to next session 11/15: unable to test due to migraine 3/2: requires heavy use of AD and seated rest breaks 4/20: walked around yard yesterday    Time 6    Period Weeks    Status Partially Met    Target Date 03/19/22      PT SHORT TERM GOAL #3   Title Patient will increase ABC scale score >80% to demonstrate better functional mobility and better confidence with ADLs.    Baseline 01/09/21: 60.625% 3/3/: 50% 3/31: 63% 6/20: 45% 7/25: 40% 9/12: 51.1% 11/15: 48% 1/17: 40% 3/2: 33% 4/20: 39% 6/6: 51%    Time 6    Period Weeks    Status Partially Met    Target Date 06/23/22              PT Long Term Goals      PT LONG TERM GOAL #1   Title Patient will increase FOTO score to equal to or greater than 65 to demonstrate statistically significant improvement in mobility and quality of life.    Baseline scored 41/100 on 05/21/2020; 7/22 40, 8/23: 60/100, 09/30/20=40, 01/09/21: 46 3/3: 40.3% 3/31: 49% 6/20: 45% 7/12: 50.7% 9/12: 44.7% 11/15: 40% 1/17: 40% 3/2: 42%    Time 12    Period  Weeks    Status Not Met    Target Date 10/20/22       PT LONG TERM GOAL #2   Title Patient will reduce falls risk as indicated by decreased TUG time to less than 11 seconds.    Baseline scored 37.9 sec with TUG on 05/21/2020; 21seconds with elevated rollator 7/22, 8/23: 13.62 sec with up and go walker, 01/09/21: 15.81s with hiking stick 3/3: 17.98  seconds one near LOB 3/31: 15.57 seconds with walking stick 6/20: 14.8 seconds with walking stick. 7/25: 19.72 seconds with walking stick. 9/26: 17.6 seconds 11/15: unable to test due to migraine 1/17: deferred due to pain 3/2:19.25 seconds with walking stick in involved hand 4/20: deferred due to migraine and back pain 6/6: 10.5 seconds with walking stick    Time 12    Period Weeks    Status Achieved    Target Date 04/30/22      PT LONG TERM GOAL #3   Title Patient will increase right UE and LE gross strength to 4+/5 throughout to improve functional strength for independent gait, increased standing tolerance and increased ADL ability.    Baseline grossly +3/5 to -4/5 right UE and LE strength on 05/21/2020; grossly 4-/5 for RUE, grossly 4-/5 for RLE on 7/22, grossly 4-/5 RUE, grossly 4/5 RLE 3/3: see note 3/2: see note 4/20: RLE 4-/5 with quad 4/5 RUE 4/5 with bicep 3+/5 6/6: see note: 7/25: see note   Time 12    Period Weeks    Status Partially Met    Target Date 10/20/22     PT LONG TERM GOAL #4   Title Patient will increase 10 meter walk test to >1.10ms as to improve gait speed for better community ambulation and to reduce fall risk.    Baseline on 7/22 .57 m/sself selected, fast  .73 m/s with LOB pt reported R knee buckling, elevated rollator used, 8/23: 1.06 m/s with up and go walker, 01/09/21: 0.72 m/s with hiking stick 3/3: 0.74 m/s with walking stick 3/31: 0.81 m/s w walking stick 6/20: 1.1 m/s with walking stick    Time 12    Period Weeks    Status Achieved      PT LONG TERM GOAL #5   Title Patient will increase Berg Balance score by >  6 points to demonstrate decreased fall risk during functional activities.    Baseline 8/23: 37/56, 09/30/20=40/56, 12/20: 42/56 3/3: deferred 3/31 will perform next time; deferred due to throwing up 6/20: 46/56 7/12 38/56    Time 8    Period Weeks    Status Achieved      Additional Long Term Goals   Additional Long Term Goals Yes      PT LONG TERM GOAL #6   Title Patient (< 676years old) will complete five times sit to stand test in < 10 seconds indicating an increased LE strength and improved balance.    Baseline 8/23: 22 sec, 09/30/20=13.04 sec, 12/20: 27.05 sec, 01/09/21: 18.01s 3/3: 23 seconds no UE support 3/31; deferred due to patient throwing up 7/12: 15.2 seconds 11/15: 26 seconds 1/17: deferred due to pain 3/2: 17.5 seconds 4/20: deferred due to migraine and back pain 6/6: 11 seconds with SUE support 8/22: 13.7 seconds with SUE support    Time 12    Period Weeks    Status Partially Met    Target Date 10/20/22     PT LONG TERM GOAL #7   Title Patient will improve 6 min walk test >1000 feet with LRAD for improved gait ability in community.    Baseline 8/23: not tested; 8/30 845, goal adjusted >20038f 09/30/20=600 feet - stopped test due to R ankle catching making gait unsafe, 11/25/20= 330 feet - stopped test due to severe dizziness, vomiting/nausea, sweating making gait unsafe; 01/09/21: 720 ft with no rest breaks 3/3: deferred due to nausea 3/31: deferred due to patient throwing up 6/20: 640 ft 7/12:  666f with walking stick, no rest breaks; 9/26: 1000 ft    Time 12    Period Weeks    Status Achieved      PT LONG TERM GOAL #8   Title Patient will increase Berg Balance score by > 6 points  (52/56) to demonstrate decreased fall risk during functional activities.    Baseline 6/20: 46/56 7/12: 38/56 with migraine 9/12: defer to next session 11/15:  unable to test due to migraine 1/17: deferred due to pain 3/2: next session 4/20: deferred due to migraine and back pain    Time 12     Period Weeks    Status Partially Met    Target Date 10/20/22     PT LONG TERM GOAL  #9   TITLE Patient will report decreased frequency of falling per week to 1x/week for decreased fall risk and improved quality of life    Baseline 12/1: 4 falls minimum a week 1/17: 1 fall last week 3/2: has been bed bound the week prior 4/20: 2-3x/week 6/6: 2x/week 7/25: 2x/week   Time 12    Period Weeks    Status Partially Met    Target Date 08/04/22      PT LONG TERM GOAL  #10   TITLE Patient will turn head 75% of the time without dizziness or LOB.    Baseline 6/6: patient falls and has extreme dizziness resulting in throwing up. 7/25: 25% of the time   Time 12    Period Weeks    Status Partially Met   Target Date 10/20/22     PT LONG TERM GOAL  #11   TITLE Patient will report a worst pain of 4/10 on VAS in cervical and lumbar  to improve tolerance with ADLs and reduced symptoms with activities.    Baseline 6/6: lumbar: 10/10, cervical 8/10 7/25: lumbar 6-7/10; cervical 5-6/10    Time 12    Period Weeks    Status Partially Met   Target Date 10/20/22               Plan     Clinical Impression Statement Patient will be transitioning care to vestibular therapy. Patient is at point where he is primarily limited by dizziness and limited rotation. Patient agreeable to change in plan of care.  Patient will benefit from therapy to reduce fall risk, improve strength, and improve capacity for functional mobility for improved quality of life    Personal Factors and Comorbidities Comorbidity 3+;Time since onset of injury/illness/exacerbation    Comorbidities anxiety, back pain, COPD, headache, HTN, MI, sleep apnea, migraines    Examination-Activity Limitations Locomotion Level;Transfers;Bed Mobility;Stand;Stairs;Sleep;Squat;Bend    Examination-Participation Restrictions Driving;Medication Management    Stability/Clinical Decision Making Unstable/Unpredictable    Rehab Potential Fair    PT Frequency  1x / week    PT Duration 12 weeks    PT Treatment/Interventions ADLs/Self Care Home Management;Canalith Repostioning;Moist Heat;Electrical Stimulation;Gait training;Stair training;Functional mobility training;Therapeutic activities;Therapeutic exercise;Balance training;Neuromuscular re-education;Patient/family education;Manual techniques;Passive range of motion;Vestibular    PT Next Visit Plan Continue with functional mobility training and strengthening;    PT Home Exercise Plan Medbridge Access Code: [email or web address]    Consulted and Agree with Plan of Care Patient;Family member/caregiver    Family Member Consulted Wife               HZollie Pee PVirginia10/02/2022, 8:42 AM

## 2022-09-15 ENCOUNTER — Ambulatory Visit: Payer: BC Managed Care – PPO

## 2022-09-15 ENCOUNTER — Ambulatory Visit: Payer: BC Managed Care – PPO | Attending: Neurology

## 2022-09-15 DIAGNOSIS — M542 Cervicalgia: Secondary | ICD-10-CM | POA: Diagnosis present

## 2022-09-15 DIAGNOSIS — R42 Dizziness and giddiness: Secondary | ICD-10-CM | POA: Diagnosis not present

## 2022-09-15 DIAGNOSIS — R2681 Unsteadiness on feet: Secondary | ICD-10-CM | POA: Insufficient documentation

## 2022-09-15 NOTE — Therapy (Signed)
OUTPATIENT PHYSICAL THERAPY TREATMENT NOTE   Patient Name: Cory Espinoza MRN: [id number] DOB:October 04, 1971, 51 y.o., male Today's Date: 09/16/2022  PCP: [place] Pink MD REFERRING PROVIDER: Jennings Books MD          Past Medical History:  Diagnosis Date   Allergy    Anginal pain [facility])    Anxiety    Arthritis    lumbar spine   Asthma    Atrial fibrillation ([place])    Back pain    Severe Lumbar Pain   CHF (congestive heart failure) (HCC)    COPD (chronic obstructive pulmonary disease) ([place])    Restrictive lung disease   Coronary artery disease    Dyspnea    Dysrhythmia    afib   GERD (gastroesophageal reflux disease)    Headache    [name], onset October 2020, cluster headaches in the past   History of kidney stones    Hyperlipidemia    Hypertension    Myocardial infarction ([place])    at age 75   Pneumonia    Restrictive lung disease    Sleep apnea    unable to use cpap since onset of migraines   Past Surgical History:  Procedure Laterality Date   ABDOMINAL EXPOSURE N/A 03/25/2020   Procedure: ABDOMINAL EXPOSURE;  Surgeon: Rosetta Posner, MD;  Location: [facility];  Service: Vascular;  Laterality: N/A;   ANTERIOR CERVICAL DECOMP/DISCECTOMY FUSION N/A 01/23/2020   Procedure: ANTERIOR CERVICAL DECOMPRESSION FUSION - CERVICAL SIX-CERVICAL SEVEN;  Surgeon: Earnie Larsson, MD;  Location: [place];  Service: Neurosurgery;  Laterality: N/A;   ANTERIOR LUMBAR FUSION N/A 03/25/2020   Procedure: ANTERIOR LUMBAR INTERBODY FUSION LUMBAR FIVE-SACRAL ONE.;  Surgeon: Earnie Larsson, MD;  Location: [place];  Service: Neurosurgery;  Laterality: N/A;  anterior   BACK SURGERY  1996   CARDIAC CATHETERIZATION Left 04/27/2016   Procedure: Left Heart Cath and Coronary Angiography;  Surgeon: Corey Skains, MD;  Location: [place] [facility];  Service: Cardiovascular;  Laterality: Left;   CARDIAC CATHETERIZATION N/A 04/27/2016   Procedure: Intravascular Pressure Wire/FFR Study;  Surgeon: Yolonda Kida, MD;  Location: [place] [facility];  Service: Cardiovascular;  Laterality: N/A;   CATHETERIZATION OF PULMONARY ARTERY WITH RETRIEVAL OF FOREIGN BODY Bilateral 04/07/2011   heart   DEGENERATIVE SPONDYLOLISTHESIS     KNEE ARTHROSCOPY Bilateral 2004   LUMBAR DISC SURGERY  1999   RADICULOPATHY, CERVICAL REGION     TONSILLECTOMY     Patient Active Problem List   Diagnosis Date Noted   [name] (coronary artery disease) 10/21/2021   Degenerative spondylolisthesis 03/25/2020   Physical deconditioning 03/04/2020   Lobar pneumonia, unspecified organism ([place]) 03/04/2020   Intractable hemiplegic migraine with status migrainosus 03/02/2020   Current tobacco use 02/05/2020   Abnormal findings on diagnostic imaging of lung 02/05/2020   Shortness of breath 02/05/2020   Herniated disc, cervical 01/23/2020   Cervical spondylosis with myelopathy and radiculopathy 01/23/2020   Pre-operative respiratory examination 01/12/2020   Weakness on right side of face 12/22/2019   Osteoarthritis of spine with radiculopathy, lumbar region 12/11/2019   Intractable migraine with aura with status migrainosus 11/09/2019   Former smoker 08/27/2018   Benign essential HTN 08/27/2018   Cough productive of purulent sputum 11/08/2017   GERD (gastroesophageal reflux disease) 05/06/2017   Contusion of hand 04/22/2017   BP (high blood pressure) 04/22/2017   Oxygen desaturation 04/22/2017   Prostate lump 04/22/2017   Unstable angina (HCC) 04/27/2016   Chest pain 04/24/2016  Morbid obesity ([place]) 01/29/2016   Elevated ALT measurement 10/30/2015   Nonspecific elevation of levels of transaminase and lactic acid dehydrogenase (ldh) 10/30/2015   Reduced libido 10/29/2015   Hyperlipidemia 08/06/2015   Anxiety 08/06/2015   Atherosclerosis of coronary artery 08/06/2015   Childhood asthma 08/06/2015   Chronic obstructive pulmonary disease ([place]) 08/06/2015   OSA (obstructive sleep apnea) 08/06/2015   Anxiety disorder  08/06/2015   Atherosclerosis of native coronary artery of native heart with stable angina pectoris ([place]) 74/16/3845   Uncomplicated asthma 36/46/8032    REFERRING DIAG: BPPV/unsteadiness  THERAPY DIAG:  Dizziness and giddiness  Unsteadiness on feet  Cervicalgia  Rationale for Evaluation and Treatment Rehabilitation  PERTINENT HISTORY: Pt has a complex medical history. 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 He states that he fell 10-12 feet and struck his head, neck, and back. He was able to finish the course which took him approximately 45 minutes more to complete. He does endorse a second fall while finishing the course but did not suffer a second head injury. He states that he started getting headaches that day which have persisted since that time. 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 He does report improvement in his headaches recently. Neurology was concerned about possible BPPV and have referred him for a vestibular evaluation. In addition since the injury, pt developed RUE and RLE pain and weakness. Pt states that NCV showed abnormal nerve conduction in RUE. He has since undergone a C7-7 ACDF on 01/23/20. He reports initial improvement in RUE strength, but states that he has a gradual return of his RUE weakness. He was also having significant RLE weakness and pain and underwent and L5-S1 anterior lumbar interbody fusion on 03/25/20. Patient reports that he does not have any back precautions but needs to wear the low back brace for one more month. Patient reports he has a follow-up appointment with the surgeon next month. Patient reports he has a follow-up appointment with Dr. Brigitte Pulse, neurologist, in August. He arrives to therapy ambulating with an upright rollator walker. Pt denies any mention of concussion after his injury. He has been having cognitive issues since his injury  and has previously had a heavy metals screen as well as neurocognitive testing. He has had an MRI of his brain with and without contrast on 12/18/19. Results were mildly motion degraded examination. No evidence of acute intracranial abnormality. Minimal chronic small vessel ischemic disease. He has tremor in both his hands. He has a positive family history of Parkinson's disease in his grandfather. He is complaining of constant dizziness and unsteadiness which are worse with activity. Patient states that he frequently loses his balance and his wife has to steady him.  PRECAUTIONS: Falls  SUBJECTIVE: Pt returns to PT after brief absence, he is here for continuing care and vestibular assessment.   Describe your dizziness without using the term "dizzy:" Pt states he feels unsteady, spinning, nausea,swimmy-headed at times.  He reports spinning can last minutes or longer. Pt is taking meclizine, reports he usually takes prior to PT, including today. Pt has an inhaler with peppermint he uses for nausea. Pt has vomited before due to vertigo. Vertigo has been on and off since 2020. Pt spouse reports pt fell a couple days ago at night going to the bathroom. Pt hit his head. He states "too many falls in the last six  months." Pt cannot always get up when falls to the floor. Pt spouse reports pt had recent seizure that lasted longer than normal (PTSD type seizures). Seizures(?) typically can last from 10 to 24 hours. Yawning frequently is a sign of oncoming seizure for pt. Pt has interrupted sleep. He has neck pain but has been using a massager for his neck and found this to help a bit. Some days his neck pain ranges from 1-2/10, some days 7-8/10. Pt can experience double vision with migraines.Other symptoms include impaired sensation in R leg.  How long does your vertigo last (seconds, hours, >24 hours): Has experienced spinning that lasts over 24 hours  Any other symptoms associated with your dizziness? Unsure if  dizzier with migraine symptoms   Do you have trouble with going into busy stores, using screens, or being a passenger in a car and seeing other cars go by?  -issues with being a passenger in a car and watching cars go by  -no issue with screens   -issue with busy stores   What increases likelihood of a migraine? Constantly has a migraine, pt reports the intensity can improve, he has come down to a 2/10 before, can be 5-10/10. Pt getting infusions currently, and thinks they're working. Strong smells can worsen migraine.   What tends to make it more likely you'll have a seizure? Flashing lights, stress and when pt feels angry reports he can have some trouble controlling emotions at times.  PAIN:  Are you having pain? Yes: NPRS scale: 3/10 Pain location: head Pain description: migraine Aggravating factors: turning head, weather Relieving factors: medicine, dark room     TODAY'S TREATMENT:   Ocular ROM - WNL Smooth pursuits - saccadic  Saccades - slow, undershoots with correction Vergence - 6" out pt reports double vision  - PT recommends seek referral to neuro-ophthalmologist   Slow VOR "x" - saccadic, pt reports "x" moves, makes him a little dizzy   PT recommends pt use his neck massager prior to performing HEP (see below). Instruction in performing HEP to symptom tolerance (no greater than 2 point increase in dizziness, start slow, stop intervention if feeling like this is increasing pre-seizure or migraine symptoms.    Taken at previous appt: RUE and RLE strength  Right Left  Shoulder Flexion 3+ 4  Shoulder Abduction 4- 4+  ER 4- 5  IR 4 5  bicep 4 5  tricep 4- 5     Right Left  Hip flexion 4 4+  Hip Abduction 4- 5  Hip Adduction 4- 5  Knee Extension  4 5  Knee Flexion 3+ 5  DF 3 5  PF 4- 5      PATIENT EDUCATION: Education details: exercise technique, body mechanics Person educated: Patient Education method: Explanation, Demonstration, Tactile cues, and  Verbal cues Education comprehension: verbalized understanding, returned demonstration, verbal cues required, and tactile cues required   HOME EXERCISE PROGRAM: 09/15/2022: Access Code: [email or web address] URL: [email or web address] Date: 09/15/2022 Prepared by: Ricard Dillon  Exercises - Seated Gaze Stabilization with Head Rotation  - 1 x daily - 7 x weekly - 3 sets - 2 reps - 30 sec hold    PT Short Term Goals       PT SHORT TERM GOAL #1   Title Patient will be independent in home exercise program to improve strength/mobility for better functional independence with ADLs and for self-management.    Baseline 3/3 HEP compliant 3/31 hep compliant    Time 6  Period Weeks    Status Achieved    Target Date 02/20/21      PT SHORT TERM GOAL #2   Title Patient will be modified independent in walking on even/uneven surface with least restrictive assistive device, for 10+ minutes without rest break, reporting some difficulty or less to improve walking tolerance with community ambulation including grocery shopping, going to church,etc.    Baseline 02/03: instability, LOB multiple times while ambulating to elevator 3/31: unable to test due to throwing up 6/20: 6 minutes 7/12: unable to test due to migraine 9/12: defer to next session 11/15: unable to test due to migraine 3/2: requires heavy use of AD and seated rest breaks 4/20: walked around yard yesterday    Time 6    Period Weeks    Status Partially Met    Target Date 03/19/22      PT SHORT TERM GOAL #3   Title Patient will increase ABC scale score >80% to demonstrate better functional mobility and better confidence with ADLs.    Baseline 01/09/21: 60.625% 3/3/: 50% 3/31: 63% 6/20: 45% 7/25: 40% 9/12: 51.1% 11/15: 48% 1/17: 40% 3/2: 33% 4/20: 39% 6/6: 51%    Time 6    Period Weeks    Status Partially Met    Target Date 06/23/22              PT Long Term Goals      PT LONG TERM GOAL #1   Title Patient will increase FOTO  score to equal to or greater than 65 to demonstrate statistically significant improvement in mobility and quality of life.    Baseline scored 41/100 on 05/21/2020; 7/22 40, 8/23: 60/100, 09/30/20=40, 01/09/21: 46 3/3: 40.3% 3/31: 49% 6/20: 45% 7/12: 50.7% 9/12: 44.7% 11/15: 40% 1/17: 40% 3/2: 42%    Time 12    Period Weeks    Status Not Met    Target Date 10/20/22       PT LONG TERM GOAL #2   Title Patient will reduce falls risk as indicated by decreased TUG time to less than 11 seconds.    Baseline scored 37.9 sec with TUG on 05/21/2020; 21seconds with elevated rollator 7/22, 8/23: 13.62 sec with up and go walker, 01/09/21: 15.81s with hiking stick 3/3: 17.98 seconds one near LOB 3/31: 15.57 seconds with walking stick 6/20: 14.8 seconds with walking stick. 7/25: 19.72 seconds with walking stick. 9/26: 17.6 seconds 11/15: unable to test due to migraine 1/17: deferred due to pain 3/2:19.25 seconds with walking stick in involved hand 4/20: deferred due to migraine and back pain 6/6: 10.5 seconds with walking stick    Time 12    Period Weeks    Status Achieved    Target Date 04/30/22      PT LONG TERM GOAL #3   Title Patient will increase right UE and LE gross strength to 4+/5 throughout to improve functional strength for independent gait, increased standing tolerance and increased ADL ability.    Baseline grossly +3/5 to -4/5 right UE and LE strength on 05/21/2020; grossly 4-/5 for RUE, grossly 4-/5 for RLE on 7/22, grossly 4-/5 RUE, grossly 4/5 RLE 3/3: see note 3/2: see note 4/20: RLE 4-/5 with quad 4/5 RUE 4/5 with bicep 3+/5 6/6: see note: 7/25: see note   Time 12    Period Weeks    Status Partially Met    Target Date 10/20/22     PT LONG TERM GOAL #4   Title Patient will increase  10 meter walk test to >1.33ms as to improve gait speed for better community ambulation and to reduce fall risk.    Baseline on 7/22 .57 m/sself selected, fast  .73 m/s with LOB pt reported R knee buckling,  elevated rollator used, 8/23: 1.06 m/s with up and go walker, 01/09/21: 0.72 m/s with hiking stick 3/3: 0.74 m/s with walking stick 3/31: 0.81 m/s w walking stick 6/20: 1.1 m/s with walking stick    Time 12    Period Weeks    Status Achieved      PT LONG TERM GOAL #5   Title Patient will increase Berg Balance score by > 6 points to demonstrate decreased fall risk during functional activities.    Baseline 8/23: 37/56, 09/30/20=40/56, 12/20: 42/56 3/3: deferred 3/31 will perform next time; deferred due to throwing up 6/20: 46/56 7/12 38/56    Time 8    Period Weeks    Status Achieved      Additional Long Term Goals   Additional Long Term Goals Yes      PT LONG TERM GOAL #6   Title Patient (< 646years old) will complete five times sit to stand test in < 10 seconds indicating an increased LE strength and improved balance.    Baseline 8/23: 22 sec, 09/30/20=13.04 sec, 12/20: 27.05 sec, 01/09/21: 18.01s 3/3: 23 seconds no UE support 3/31; deferred due to patient throwing up 7/12: 15.2 seconds 11/15: 26 seconds 1/17: deferred due to pain 3/2: 17.5 seconds 4/20: deferred due to migraine and back pain 6/6: 11 seconds with SUE support 8/22: 13.7 seconds with SUE support    Time 12    Period Weeks    Status Partially Met    Target Date 10/20/22     PT LONG TERM GOAL #7   Title Patient will improve 6 min walk test >1000 feet with LRAD for improved gait ability in community.    Baseline 8/23: not tested; 8/30 845, goal adjusted >20028f 09/30/20=600 feet - stopped test due to R ankle catching making gait unsafe, 11/25/20= 330 feet - stopped test due to severe dizziness, vomiting/nausea, sweating making gait unsafe; 01/09/21: 720 ft with no rest breaks 3/3: deferred due to nausea 3/31: deferred due to patient throwing up 6/20: 640 ft 7/12: [id number] walking stick, no rest breaks; 9/26: 1000 ft    Time 12    Period Weeks    Status Achieved      PT LONG TERM GOAL #8   Title Patient will increase Berg  Balance score by > 6 points  (52/56) to demonstrate decreased fall risk during functional activities.    Baseline 6/20: 46/56 7/12: 38/56 with migraine 9/12: defer to next session 11/15:  unable to test due to migraine 1/17: deferred due to pain 3/2: next session 4/20: deferred due to migraine and back pain    Time 12    Period Weeks    Status Partially Met    Target Date 10/20/22     PT LONG TERM GOAL  #9   TITLE Patient will report decreased frequency of falling per week to 1x/week for decreased fall risk and improved quality of life    Baseline 12/1: 4 falls minimum a week 1/17: 1 fall last week 3/2: has been bed bound the week prior 4/20: 2-3x/week 6/6: 2x/week 7/25: 2x/week   Time 12    Period Weeks    Status Partially Met    Target Date 08/04/22      PT  LONG TERM GOAL  #10   TITLE Patient will turn head 75% of the time without dizziness or LOB.    Baseline 6/6: patient falls and has extreme dizziness resulting in throwing up. 7/25: 25% of the time   Time 12    Period Weeks    Status Partially Met   Target Date 10/20/22     PT LONG TERM GOAL  #11   TITLE Patient will report a worst pain of 4/10 on VAS in cervical and lumbar  to improve tolerance with ADLs and reduced symptoms with activities.    Baseline 6/6: lumbar: 10/10, cervical 8/10 7/25: lumbar 6-7/10; cervical 5-6/10    Time 12    Period Weeks    Status Partially Met   Target Date 10/20/22               Plan     Clinical Impression Statement Pt returns to PT to address dizziness symptoms impacting balance and safety. Pt with hx of TBI and multiple falls where he has hit his head. Oculomotor exam with multiple abnormal findings and central indicators, consistent with pt history. Positional testing to be completed as appropriate, since peripheral component is highly likely due to multiple falls with head impact. Instructed pt in seated VORx1 with horizontal head turns for HEP, to be performed to tolerance. The pt  will benefit from further skilled PT to decrease dizziness in order to improve balance, QOL and decrease fall risk.   Personal Factors and Comorbidities Comorbidity 3+;Time since onset of injury/illness/exacerbation    Comorbidities anxiety, back pain, COPD, headache, HTN, MI, sleep apnea, migraines    Examination-Activity Limitations Locomotion Level;Transfers;Bed Mobility;Stand;Stairs;Sleep;Squat;Bend    Examination-Participation Restrictions Driving;Medication Management    Stability/Clinical Decision Making Unstable/Unpredictable    Rehab Potential Fair    PT Frequency 1x / week    PT Duration 12 weeks    PT Treatment/Interventions ADLs/Self Care Home Management;Canalith Repostioning;Moist Heat;Electrical Stimulation;Gait training;Stair training;Functional mobility training;Therapeutic activities;Therapeutic exercise;Balance training;Neuromuscular re-education;Patient/family education;Manual techniques;Passive range of motion;Vestibular    PT Next Visit Plan VBI and Cervical ROM screen, maneuvers as approrpiate   PT Home Exercise Plan Medbridge Access Code: [email or web address]    Consulted and Agree with Plan of Care Patient;Family member/caregiver    Family Member Consulted Wife               Zollie Pee, [place] 09/16/2022, 1:13 PM

## 2022-09-22 ENCOUNTER — Ambulatory Visit: Payer: BC Managed Care – PPO

## 2022-09-29 ENCOUNTER — Ambulatory Visit: Payer: BC Managed Care – PPO

## 2022-09-29 DIAGNOSIS — R2681 Unsteadiness on feet: Secondary | ICD-10-CM

## 2022-09-29 DIAGNOSIS — R42 Dizziness and giddiness: Secondary | ICD-10-CM | POA: Diagnosis not present

## 2022-09-29 NOTE — Therapy (Signed)
OUTPATIENT PHYSICAL THERAPY TREATMENT NOTE   Patient Name: Cory Espinoza MRN: [id number] DOB:1971-05-16, 51 y.o., male Today's Date: 09/29/2022  PCP: [place] Pink MD REFERRING PROVIDER: Jennings Books MD          Past Medical History:  Diagnosis Date   Allergy    Anginal pain [facility])    Anxiety    Arthritis    lumbar spine   Asthma    Atrial fibrillation ([place])    Back pain    Severe Lumbar Pain   CHF (congestive heart failure) (HCC)    COPD (chronic obstructive pulmonary disease) ([place])    Restrictive lung disease   Coronary artery disease    Dyspnea    Dysrhythmia    afib   GERD (gastroesophageal reflux disease)    Headache    [name], onset October 2020, cluster headaches in the past   History of kidney stones    Hyperlipidemia    Hypertension    Myocardial infarction ([place])    at age 89   Pneumonia    Restrictive lung disease    Sleep apnea    unable to use cpap since onset of migraines   Past Surgical History:  Procedure Laterality Date   ABDOMINAL EXPOSURE N/A 03/25/2020   Procedure: ABDOMINAL EXPOSURE;  Surgeon: Rosetta Posner, MD;  Location: [facility] OR;  Service: Vascular;  Laterality: N/A;   ANTERIOR CERVICAL DECOMP/DISCECTOMY FUSION N/A 01/23/2020   Procedure: ANTERIOR CERVICAL DECOMPRESSION FUSION - CERVICAL SIX-CERVICAL SEVEN;  Surgeon: Earnie Larsson, MD;  Location: [place];  Service: Neurosurgery;  Laterality: N/A;   ANTERIOR LUMBAR FUSION N/A 03/25/2020   Procedure: ANTERIOR LUMBAR INTERBODY FUSION LUMBAR FIVE-SACRAL ONE.;  Surgeon: Earnie Larsson, MD;  Location: [place];  Service: Neurosurgery;  Laterality: N/A;  anterior   BACK SURGERY  1996   CARDIAC CATHETERIZATION Left 04/27/2016   Procedure: Left Heart Cath and Coronary Angiography;  Surgeon: Corey Skains, MD;  Location: [place] [facility];  Service: Cardiovascular;  Laterality: Left;   CARDIAC CATHETERIZATION N/A 04/27/2016   Procedure: Intravascular Pressure Wire/FFR Study;  Surgeon: Yolonda Kida, MD;  Location: [place] [facility];  Service: Cardiovascular;  Laterality: N/A;   CATHETERIZATION OF PULMONARY ARTERY WITH RETRIEVAL OF FOREIGN BODY Bilateral 04/07/2011   heart   DEGENERATIVE SPONDYLOLISTHESIS     KNEE ARTHROSCOPY Bilateral 2004   LUMBAR DISC SURGERY  1999   RADICULOPATHY, CERVICAL REGION     TONSILLECTOMY     Patient Active Problem List   Diagnosis Date Noted   [name] (coronary artery disease) 10/21/2021   Degenerative spondylolisthesis 03/25/2020   Physical deconditioning 03/04/2020   Lobar pneumonia, unspecified organism ([place]) 03/04/2020   Intractable hemiplegic migraine with status migrainosus 03/02/2020   Current tobacco use 02/05/2020   Abnormal findings on diagnostic imaging of lung 02/05/2020   Shortness of breath 02/05/2020   Herniated disc, cervical 01/23/2020   Cervical spondylosis with myelopathy and radiculopathy 01/23/2020   Pre-operative respiratory examination 01/12/2020   Weakness on right side of face 12/22/2019   Osteoarthritis of spine with radiculopathy, lumbar region 12/11/2019   Intractable migraine with aura with status migrainosus 11/09/2019   Former smoker 08/27/2018   Benign essential HTN 08/27/2018   Cough productive of purulent sputum 11/08/2017   GERD (gastroesophageal reflux disease) 05/06/2017   Contusion of hand 04/22/2017   BP (high blood pressure) 04/22/2017   Oxygen desaturation 04/22/2017   Prostate lump 04/22/2017   Unstable angina (HCC) 04/27/2016   Chest pain 04/24/2016  Morbid obesity ([place]) 01/29/2016   Elevated ALT measurement 10/30/2015   Nonspecific elevation of levels of transaminase and lactic acid dehydrogenase (ldh) 10/30/2015   Reduced libido 10/29/2015   Hyperlipidemia 08/06/2015   Anxiety 08/06/2015   Atherosclerosis of coronary artery 08/06/2015   Childhood asthma 08/06/2015   Chronic obstructive pulmonary disease ([place]) 08/06/2015   OSA (obstructive sleep apnea) 08/06/2015   Anxiety disorder  08/06/2015   Atherosclerosis of native coronary artery of native heart with stable angina pectoris ([place]) 16/96/7893   Uncomplicated asthma 81/12/7508    REFERRING DIAG: BPPV/unsteadiness  THERAPY DIAG:  Dizziness and giddiness  Unsteadiness on feet  Rationale for Evaluation and Treatment Rehabilitation  PERTINENT HISTORY: Pt has a complex medical history. 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 He states that he fell 10-12 feet and struck his head, neck, and back. He was able to finish the course which took him approximately 45 minutes more to complete. He does endorse a second fall while finishing the course but did not suffer a second head injury. He states that he started getting headaches that day which have persisted since that time. 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 He does report improvement in his headaches recently. Neurology was concerned about possible BPPV and have referred him for a vestibular evaluation. In addition since the injury, pt developed RUE and RLE pain and weakness. Pt states that NCV showed abnormal nerve conduction in RUE. He has since undergone a C7-7 ACDF on 01/23/20. He reports initial improvement in RUE strength, but states that he has a gradual return of his RUE weakness. He was also having significant RLE weakness and pain and underwent and L5-S1 anterior lumbar interbody fusion on 03/25/20. Patient reports that he does not have any back precautions but needs to wear the low back brace for one more month. Patient reports he has a follow-up appointment with the surgeon next month. Patient reports he has a follow-up appointment with Dr. Brigitte Pulse, neurologist, in August. He arrives to therapy ambulating with an upright rollator walker. Pt denies any mention of concussion after his injury. He has been having cognitive issues since his injury and has  previously had a heavy metals screen as well as neurocognitive testing. He has had an MRI of his brain with and without contrast on 12/18/19. Results were mildly motion degraded examination. No evidence of acute intracranial abnormality. Minimal chronic small vessel ischemic disease. He has tremor in both his hands. He has a positive family history of Parkinson's disease in his grandfather. He is complaining of constant dizziness and unsteadiness which are worse with activity. Patient states that he frequently loses his balance and his wife has to steady him.   Describe your dizziness without using the term "dizzy:" Pt states he feels unsteady, spinning, nausea,swimmy-headed at times.  He reports spinning can last minutes or longer. Pt is taking meclizine, reports he usually takes prior to PT, including today. Pt has an inhaler with peppermint he uses for nausea. Pt has vomited before due to vertigo. Vertigo has been on and off since 2020. Pt spouse reports pt fell a couple days ago at night going to the bathroom. Pt hit his head. He states "too many falls in the last six months." Pt cannot always get up when falls to the floor. Pt spouse reports pt had recent seizure that lasted longer than normal (  PTSD type seizures). Seizures(?) typically can last from 10 to 24 hours. Yawning frequently is a sign of oncoming seizure for pt. Pt has interrupted sleep. He has neck pain but has been using a massager for his neck and found this to help a bit. Some days his neck pain ranges from 1-2/10, some days 7-8/10. Pt can experience double vision with migraines.Other symptoms include impaired sensation in R leg.  How long does your vertigo last (seconds, hours, >24 hours): Has experienced spinning that lasts over 24 hours  Any other symptoms associated with your dizziness? Unsure if dizzier with migraine symptoms   Do you have trouble with going into busy stores, using screens, or being a passenger in a car and seeing  other cars go by?  -issues with being a passenger in a car and watching cars go by  -no issue with screens   -issue with busy stores   What increases likelihood of a migraine? Constantly has a migraine, pt reports the intensity can improve, he has come down to a 2/10 before, can be 5-10/10. Pt getting infusions currently, and thinks they're working. Strong smells can worsen migraine.   What tends to make it more likely you'll have a seizure? Flashing lights, stress and when pt feels angry reports he can have some trouble controlling emotions at times.  PRECAUTIONS: Falls  SUBJECTIVE: Pt has not had good symptom days recently. His spouse reports two falls since previous session where pt reports he hit his head (he reports this occurs often). Pt reports he is OK, and that he considers getting up from falls to be most difficult part.   PAIN:  Are you having pain? Yes: NPRS scale: not rated/10 Pain location: head Pain description: migraine Aggravating factors: turning head, weather Relieving factors: medicine, dark room     TODAY'S TREATMENT:   Gait belt donned and CGA-mod a provided throughout (SPT assisting PT this session with guarding).   Pt on tilt table: Loaded R Dix-Hallpike - no nystagmus noted, however, pt reports feeling dizzy and nauseated.   Loaded L Dix-Hallpike with vestibular goggled donned - no clear nystagmus noted in supine position, however, pt reports feeling dizzy and nauseated. He did exhibit possible horizontal nystagmus when initially sitting up from R Dix-Hallpike and then rotating his head to L.   Pt reports symptoms on R slightly worse than L.   Due to intensity of symptoms further treatment discontinued and pt monitored for remainder of session. Pt symptoms improved by end of session, however, pt still somewhat unsteady and dizzy. PT and SPT ambulated with pt to his car as pt declines to use transport chair/WC at this time. PT educated pt in post testing  precautions, red flags, instructed pt to not try anything that could make him unsteady or challenge his balance for remainder of day and to take it easy.      PATIENT EDUCATION: Education details: purpose of positional testing, post-positional testing precautions  Person educated: Patient Education method: Explanation, Demonstration, Tactile cues, and Verbal cues Education comprehension: verbalized understanding, returned demonstration, verbal cues required, and tactile cues required   HOME EXERCISE PROGRAM: No updates on this date, pt to continue as previously given 09/15/2022: Access Code: [email or web address] URL: [email or web address] Date: 09/15/2022 Prepared by: Ricard Dillon  Exercises - Seated Gaze Stabilization with Head Rotation  - 1 x daily - 7 x weekly - 3 sets - 2 reps - 30 sec hold    PT Short Term Goals  PT SHORT TERM GOAL #1   Title Patient will be independent in home exercise program to improve strength/mobility for better functional independence with ADLs and for self-management.    Baseline 3/3 HEP compliant 3/31 hep compliant    Time 6    Period Weeks    Status Achieved    Target Date 02/20/21      PT SHORT TERM GOAL #2   Title Patient will be modified independent in walking on even/uneven surface with least restrictive assistive device, for 10+ minutes without rest break, reporting some difficulty or less to improve walking tolerance with community ambulation including grocery shopping, going to church,etc.    Baseline 02/03: instability, LOB multiple times while ambulating to elevator 3/31: unable to test due to throwing up 6/20: 6 minutes 7/12: unable to test due to migraine 9/12: defer to next session 11/15: unable to test due to migraine 3/2: requires heavy use of AD and seated rest breaks 4/20: walked around yard yesterday    Time 6    Period Weeks    Status Partially Met    Target Date 03/19/22      PT SHORT TERM GOAL #3   Title Patient will  increase ABC scale score >80% to demonstrate better functional mobility and better confidence with ADLs.    Baseline 01/09/21: 60.625% 3/3/: 50% 3/31: 63% 6/20: 45% 7/25: 40% 9/12: 51.1% 11/15: 48% 1/17: 40% 3/2: 33% 4/20: 39% 6/6: 51%    Time 6    Period Weeks    Status Partially Met    Target Date 06/23/22              PT Long Term Goals      PT LONG TERM GOAL #1   Title Patient will increase FOTO score to equal to or greater than 65 to demonstrate statistically significant improvement in mobility and quality of life.    Baseline scored 41/100 on 05/21/2020; 7/22 40, 8/23: 60/100, 09/30/20=40, 01/09/21: 46 3/3: 40.3% 3/31: 49% 6/20: 45% 7/12: 50.7% 9/12: 44.7% 11/15: 40% 1/17: 40% 3/2: 42%    Time 12    Period Weeks    Status Not Met    Target Date 10/20/22       PT LONG TERM GOAL #2   Title Patient will reduce falls risk as indicated by decreased TUG time to less than 11 seconds.    Baseline scored 37.9 sec with TUG on 05/21/2020; 21seconds with elevated rollator 7/22, 8/23: 13.62 sec with up and go walker, 01/09/21: 15.81s with hiking stick 3/3: 17.98 seconds one near LOB 3/31: 15.57 seconds with walking stick 6/20: 14.8 seconds with walking stick. 7/25: 19.72 seconds with walking stick. 9/26: 17.6 seconds 11/15: unable to test due to migraine 1/17: deferred due to pain 3/2:19.25 seconds with walking stick in involved hand 4/20: deferred due to migraine and back pain 6/6: 10.5 seconds with walking stick    Time 12    Period Weeks    Status Achieved    Target Date 04/30/22      PT LONG TERM GOAL #3   Title Patient will increase right UE and LE gross strength to 4+/5 throughout to improve functional strength for independent gait, increased standing tolerance and increased ADL ability.    Baseline grossly +3/5 to -4/5 right UE and LE strength on 05/21/2020; grossly 4-/5 for RUE, grossly 4-/5 for RLE on 7/22, grossly 4-/5 RUE, grossly 4/5 RLE 3/3: see note 3/2: see note 4/20: RLE  4-/5 with quad 4/5  RUE 4/5 with bicep 3+/5 6/6: see note: 7/25: see note   Time 12    Period Weeks    Status Partially Met    Target Date 10/20/22     PT LONG TERM GOAL #4   Title Patient will increase 10 meter walk test to >1.67ms as to improve gait speed for better community ambulation and to reduce fall risk.    Baseline on 7/22 .57 m/sself selected, fast  .73 m/s with LOB pt reported R knee buckling, elevated rollator used, 8/23: 1.06 m/s with up and go walker, 01/09/21: 0.72 m/s with hiking stick 3/3: 0.74 m/s with walking stick 3/31: 0.81 m/s w walking stick 6/20: 1.1 m/s with walking stick    Time 12    Period Weeks    Status Achieved      PT LONG TERM GOAL #5   Title Patient will increase Berg Balance score by > 6 points to demonstrate decreased fall risk during functional activities.    Baseline 8/23: 37/56, 09/30/20=40/56, 12/20: 42/56 3/3: deferred 3/31 will perform next time; deferred due to throwing up 6/20: 46/56 7/12 38/56    Time 8    Period Weeks    Status Achieved      Additional Long Term Goals   Additional Long Term Goals Yes      PT LONG TERM GOAL #6   Title Patient (< 657years old) will complete five times sit to stand test in < 10 seconds indicating an increased LE strength and improved balance.    Baseline 8/23: 22 sec, 09/30/20=13.04 sec, 12/20: 27.05 sec, 01/09/21: 18.01s 3/3: 23 seconds no UE support 3/31; deferred due to patient throwing up 7/12: 15.2 seconds 11/15: 26 seconds 1/17: deferred due to pain 3/2: 17.5 seconds 4/20: deferred due to migraine and back pain 6/6: 11 seconds with SUE support 8/22: 13.7 seconds with SUE support    Time 12    Period Weeks    Status Partially Met    Target Date 10/20/22     PT LONG TERM GOAL #7   Title Patient will improve 6 min walk test >1000 feet with LRAD for improved gait ability in community.    Baseline 8/23: not tested; 8/30 845, goal adjusted >20045f 09/30/20=600 feet - stopped test due to R ankle catching  making gait unsafe, 11/25/20= 330 feet - stopped test due to severe dizziness, vomiting/nausea, sweating making gait unsafe; 01/09/21: 720 ft with no rest breaks 3/3: deferred due to nausea 3/31: deferred due to patient throwing up 6/20: 640 ft 7/12: [id number] walking stick, no rest breaks; 9/26: 1000 ft    Time 12    Period Weeks    Status Achieved      PT LONG TERM GOAL #8   Title Patient will increase Berg Balance score by > 6 points  (52/56) to demonstrate decreased fall risk during functional activities.    Baseline 6/20: 46/56 7/12: 38/56 with migraine 9/12: defer to next session 11/15:  unable to test due to migraine 1/17: deferred due to pain 3/2: next session 4/20: deferred due to migraine and back pain    Time 12    Period Weeks    Status Partially Met    Target Date 10/20/22     PT LONG TERM GOAL  #9   TITLE Patient will report decreased frequency of falling per week to 1x/week for decreased fall risk and improved quality of life    Baseline 12/1: 4 falls minimum a week  1/17: 1 fall last week 3/2: has been bed bound the week prior 4/20: 2-3x/week 6/6: 2x/week 7/25: 2x/week   Time 12    Period Weeks    Status Partially Met    Target Date 08/04/22      PT LONG TERM GOAL  #10   TITLE Patient will turn head 75% of the time without dizziness or LOB.    Baseline 6/6: patient falls and has extreme dizziness resulting in throwing up. 7/25: 25% of the time   Time 12    Period Weeks    Status Partially Met   Target Date 10/20/22     PT LONG TERM GOAL  #11   TITLE Patient will report a worst pain of 4/10 on VAS in cervical and lumbar  to improve tolerance with ADLs and reduced symptoms with activities.    Baseline 6/6: lumbar: 10/10, cervical 8/10 7/25: lumbar 6-7/10; cervical 5-6/10    Time 12    Period Weeks    Status Partially Met   Target Date 10/20/22               Plan     Clinical Impression Statement Dix-Hallpike testing completed on tilt table this session  (allowing pt to decrease ROM required for testing). No evident nystagmus in [facility] positioning, however, pt highly symptomatic, dizzy, and nauseated. No further treatment could be provided due to symptom intensity. While pt did not exhibit nystagmus in [facility] testing position, vestibular goggles did pick up possible horizontal nystagmus when pt turned his head to L in seated (after R DH). Due to pt's history of frequent falls with head injuries, peripheral component still highly likely, despite no obvious nystagmus on this date in testing positions. I suspect pt would like benefit from canalith repositioning maneuver along with horizontal canal screening. The pt will benefit from further skilled PT to decrease dizziness in order to improve balance, QOL and decrease fall risk.   Personal Factors and Comorbidities Comorbidity 3+;Time since onset of injury/illness/exacerbation    Comorbidities anxiety, back pain, COPD, headache, HTN, MI, sleep apnea, migraines    Examination-Activity Limitations Locomotion Level;Transfers;Bed Mobility;Stand;Stairs;Sleep;Squat;Bend    Examination-Participation Restrictions Driving;Medication Management    Stability/Clinical Decision Making Unstable/Unpredictable    Rehab Potential Fair    PT Frequency 1x / week    PT Duration 12 weeks    PT Treatment/Interventions ADLs/Self Care Home Management;Canalith Repostioning;Moist Heat;Electrical Stimulation;Gait training;Stair training;Functional mobility training;Therapeutic activities;Therapeutic exercise;Balance training;Neuromuscular re-education;Patient/family education;Manual techniques;Passive range of motion;Vestibular    PT Next Visit Plan VBI and Cervical ROM screen, maneuvers as approrpiate   PT Home Exercise Plan Medbridge Access Code: [email or web address]    Consulted and Agree with Plan of Care Patient;Family member/caregiver    Family Member Consulted Wife               Zollie Pee, [place] 09/29/2022, 5:37 PM

## 2022-10-06 ENCOUNTER — Ambulatory Visit: Payer: BC Managed Care – PPO

## 2022-10-07 ENCOUNTER — Ambulatory Visit: Payer: BC Managed Care – PPO

## 2022-10-13 ENCOUNTER — Ambulatory Visit: Payer: BC Managed Care – PPO | Attending: Neurology | Admitting: Physical Therapy

## 2022-10-13 ENCOUNTER — Ambulatory Visit: Payer: BC Managed Care – PPO

## 2022-10-13 ENCOUNTER — Encounter: Payer: Self-pay | Admitting: Physical Therapy

## 2022-10-13 DIAGNOSIS — M542 Cervicalgia: Secondary | ICD-10-CM | POA: Diagnosis present

## 2022-10-13 DIAGNOSIS — R2681 Unsteadiness on feet: Secondary | ICD-10-CM

## 2022-10-13 DIAGNOSIS — R262 Difficulty in walking, not elsewhere classified: Secondary | ICD-10-CM | POA: Diagnosis present

## 2022-10-13 DIAGNOSIS — R42 Dizziness and giddiness: Secondary | ICD-10-CM

## 2022-10-13 NOTE — Therapy (Unsigned)
OUTPATIENT PHYSICAL THERAPY TREATMENT NOTE   Patient Name: Cory Espinoza MRN: [id number] DOB:November 19, 1971, 51 y.o., male Today's Date: 10/13/2022  PCP: [place] Pink MD REFERRING PROVIDER: Jennings Books MD      PT End of Session - 10/13/22 1559     Visit Number 62    Number of Visits 56    Date for PT Re-Evaluation 10/20/22    Authorization Type [employer] PPO;    PT Start Time 1600    PT Stop Time 1650    PT Time Calculation (min) 50 min    Equipment Utilized During Treatment Gait belt    Activity Tolerance Other (comment)   limited by intermittent nausea and dizziness   Behavior During Therapy WFL for tasks assessed/performed             Past Medical History:  Diagnosis Date   Allergy    Anginal pain (HCC)    Anxiety    Arthritis    lumbar spine   Asthma    Atrial fibrillation (HCC)    Back pain    Severe Lumbar Pain   CHF (congestive heart failure) (HCC)    COPD (chronic obstructive pulmonary disease) (HCC)    Restrictive lung disease   Coronary artery disease    Dyspnea    Dysrhythmia    afib   GERD (gastroesophageal reflux disease)    Headache    [name], onset October 2020, cluster headaches in the past   History of kidney stones    Hyperlipidemia    Hypertension    Myocardial infarction ([place])    at age 41   Pneumonia    Restrictive lung disease    Sleep apnea    unable to use cpap since onset of migraines   Past Surgical History:  Procedure Laterality Date   ABDOMINAL EXPOSURE N/A 03/25/2020   Procedure: ABDOMINAL EXPOSURE;  Surgeon: Rosetta Posner, MD;  Location: [facility] OR;  Service: Vascular;  Laterality: N/A;   ANTERIOR CERVICAL DECOMP/DISCECTOMY FUSION N/A 01/23/2020   Procedure: ANTERIOR CERVICAL DECOMPRESSION FUSION - CERVICAL SIX-CERVICAL SEVEN;  Surgeon: Earnie Larsson, MD;  Location: [place];  Service: Neurosurgery;  Laterality: N/A;   ANTERIOR LUMBAR FUSION N/A 03/25/2020   Procedure: ANTERIOR LUMBAR INTERBODY FUSION LUMBAR FIVE-SACRAL ONE.;   Surgeon: Earnie Larsson, MD;  Location: [place];  Service: Neurosurgery;  Laterality: N/A;  anterior   BACK SURGERY  1996   CARDIAC CATHETERIZATION Left 04/27/2016   Procedure: Left Heart Cath and Coronary Angiography;  Surgeon: Corey Skains, MD;  Location: [place] [facility];  Service: Cardiovascular;  Laterality: Left;   CARDIAC CATHETERIZATION N/A 04/27/2016   Procedure: Intravascular Pressure Wire/FFR Study;  Surgeon: Yolonda Kida, MD;  Location: [place] [facility];  Service: Cardiovascular;  Laterality: N/A;   CATHETERIZATION OF PULMONARY ARTERY WITH RETRIEVAL OF FOREIGN BODY Bilateral 04/07/2011   heart   DEGENERATIVE SPONDYLOLISTHESIS     KNEE ARTHROSCOPY Bilateral 2004   LUMBAR DISC SURGERY  1999   RADICULOPATHY, CERVICAL REGION     TONSILLECTOMY     Patient Active Problem List   Diagnosis Date Noted   [name] (coronary artery disease) 10/21/2021   Degenerative spondylolisthesis 03/25/2020   Physical deconditioning 03/04/2020   Lobar pneumonia, unspecified organism ([place]) 03/04/2020   Intractable hemiplegic migraine with status migrainosus 03/02/2020   Current tobacco use 02/05/2020   Abnormal findings on diagnostic imaging of lung 02/05/2020   Shortness of breath 02/05/2020   Herniated disc, cervical 01/23/2020   Cervical spondylosis  with myelopathy and radiculopathy 01/23/2020   Pre-operative respiratory examination 01/12/2020   Weakness on right side of face 12/22/2019   Osteoarthritis of spine with radiculopathy, lumbar region 12/11/2019   Intractable migraine with aura with status migrainosus 11/09/2019   Former smoker 08/27/2018   Benign essential HTN 08/27/2018   Cough productive of purulent sputum 11/08/2017   GERD (gastroesophageal reflux disease) 05/06/2017   Contusion of hand 04/22/2017   BP (high blood pressure) 04/22/2017   Oxygen desaturation 04/22/2017   Prostate lump 04/22/2017   Unstable angina ([place]) 04/27/2016   Chest pain 04/24/2016   Morbid obesity  ([place]) 01/29/2016   Elevated ALT measurement 10/30/2015   Nonspecific elevation of levels of transaminase and lactic acid dehydrogenase (ldh) 10/30/2015   Reduced libido 10/29/2015   Hyperlipidemia 08/06/2015   Anxiety 08/06/2015   Atherosclerosis of coronary artery 08/06/2015   Childhood asthma 08/06/2015   Chronic obstructive pulmonary disease ([place]) 08/06/2015   OSA (obstructive sleep apnea) 08/06/2015   Anxiety disorder 08/06/2015   Atherosclerosis of native coronary artery of native heart with stable angina pectoris ([place]) 86/57/8469   Uncomplicated asthma 62/95/2841    REFERRING DIAG: BPPV/unsteadiness  THERAPY DIAG:  Dizziness and giddiness  Unsteadiness on feet  Difficulty in walking, not elsewhere classified  Rationale for Evaluation and Treatment Rehabilitation  PERTINENT HISTORY: Pt has a complex medical history. He reports that he fell during a [employer] training in [place] in the fall of 2020. He states that he fell 10-12 feet and struck his head, neck, and back. He was able to finish the course which took him approximately 45 minutes more to complete. He does endorse a second fall while finishing the course but did not suffer a second head injury. He states that he started getting headaches that day which have persisted since that time. 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 He does report improvement in his headaches recently. Neurology was concerned about possible BPPV and have referred him for a vestibular evaluation. In addition since the injury, pt developed RUE and RLE pain and weakness. Pt states that NCV showed abnormal nerve conduction in RUE. He has since undergone a C7-7 ACDF on 01/23/20. He reports initial improvement in RUE strength, but states that he has a gradual return of his RUE weakness. He was also having significant RLE weakness and pain and underwent and L5-S1 anterior lumbar interbody  fusion on 03/25/20. Patient reports that he does not have any back precautions but needs to wear the low back brace for one more month. Patient reports he has a follow-up appointment with the surgeon next month. Patient reports he has a follow-up appointment with Dr. Brigitte Pulse, neurologist, in August. He arrives to therapy ambulating with an upright rollator walker. Pt denies any mention of concussion after his injury. He has been having cognitive issues since his injury and has previously had a heavy metals screen as well as neurocognitive testing. He has had an MRI of his brain with and without contrast on 12/18/19. Results were mildly motion degraded examination. No evidence of acute intracranial abnormality. Minimal chronic small vessel ischemic disease. He has tremor in both his hands. He has a positive family history of Parkinson's disease in his grandfather. He is complaining of constant dizziness and unsteadiness which are worse with activity. Patient states that he frequently loses his balance and his wife has to steady him.   Describe your dizziness without using the  term "dizzy:" Pt states he feels unsteady, spinning, nausea,swimmy-headed at times.  He reports spinning can last minutes or longer. Pt is taking meclizine, reports he usually takes prior to PT, including today. Pt has an inhaler with peppermint he uses for nausea. Pt has vomited before due to vertigo. Vertigo has been on and off since 2020. Pt spouse reports pt fell a couple days ago at night going to the bathroom. Pt hit his head. He states "too many falls in the last six months." Pt cannot always get up when falls to the floor. Pt spouse reports pt had recent seizure that lasted longer than normal (PTSD type seizures). Seizures(?) typically can last from 10 to 24 hours. Yawning frequently is a sign of oncoming seizure for pt. Pt has interrupted sleep. He has neck pain but has been using a massager for his neck and found this to help a bit. Some  days his neck pain ranges from 1-2/10, some days 7-8/10. Pt can experience double vision with migraines.Other symptoms include impaired sensation in R leg.  How long does your vertigo last (seconds, hours, >24 hours): Has experienced spinning that lasts over 24 hours  Any other symptoms associated with your dizziness? Unsure if dizzier with migraine symptoms   Do you have trouble with going into busy stores, using screens, or being a passenger in a car and seeing other cars go by?  -issues with being a passenger in a car and watching cars go by  -no issue with screens   -issue with busy stores   What increases likelihood of a migraine? Constantly has a migraine, pt reports the intensity can improve, he has come down to a 2/10 before, can be 5-10/10. Pt getting infusions currently, and thinks they're working. Strong smells can worsen migraine.   What tends to make it more likely you'll have a seizure? Flashing lights, stress and when pt feels angry reports he can have some trouble controlling emotions at times.  PRECAUTIONS: Falls  SUBJECTIVE:  Patient reports that he sleeps on his side with the head up a little at home. Pt reports sometimes he gets vertigo when he is in bed. Pt states he has had daily episodes of dizziness. Pt reports that the episodes of dizziness lasts minutes to hours. Pt states that if he gets up fast that "gets me real bad" and bending over and picking something up. Pt takes showers and has a grab bar and a shower bench. Pt states he sits sometimes and that when he rinses his hair it sometimes brings on the dizziness.   Pt has not had good symptom days recently. His spouse reports two falls since previous session where pt reports he hit his head (he reports this occurs often). Pt reports he is OK, and that he considers getting up from falls to be most difficult part.   PAIN:  Are you having pain? Yes: NPRS scale: not rated/10 Pain location: head Pain description:  migraine Aggravating factors: turning head, weather Relieving factors: medicine, dark room  TODAY'S TREATMENT:   10/13/2022:  BPPV TESTS:  Performed on inverted mat table and decreased lighting level in room.  Symptoms Duration Intensity Nystagmus  Left Dix-Hallpike Dizziness with some vertigo >1 minute 7/10 None nystagmus observed but noted darting of eyes  Right Dix-Hallpike Dizziness; denies vertigo N/A N/A None observed  Left Head Roll No increase from baseline dizziness this date N/A N/A None observed  Right Head Roll No increase from baseline dizziness this date N/A  N/A None observed   VOR X 1 exercise:  Pt performed VOR X 1 horizontal in sitting and required verbal cuing for technique as initially patient was not keeping his eyes on the target and with too much head excursion moving into his peripheral visual field. After verbal cuing provided, patient was able to perform multiple 30-45 second reps.  Patient required rest breaks between reps secondary to mild nausea and increased dizziness rated 5/10. Patient had marked increase in his symptoms with 45 second rep with increased nausea with dry heaves; therefore recommend patient continue 30 second reps at home in supported sitting with plain background.  Patient reports history of multiple falls in the past. Discussed strategies for transfers from floor with patient and his wife.  PT ambulated with pt to his car >1000' as pt declines to use transport chair at this time. Pt ambulates with walking stick with uneven steppage and mild unsteadiness noted with CGA to Mod A secondary to patient had one loss of balance requiring Mod A to correct when entering the elevator. Pt required CGA with car transfer.   11/ / 23: Gait belt donned and CGA-mod a provided throughout (SPT assisting PT this session with guarding).   Pt on tilt table: Loaded R Dix-Hallpike - no nystagmus noted, however, pt reports feeling dizzy and nauseated.  Loaded L  Dix-Hallpike with vestibular goggled donned - no clear nystagmus noted in supine position, however, pt reports feeling dizzy and nauseated. He did exhibit possible horizontal nystagmus when initially sitting up from R Dix-Hallpike and then rotating his head to L.  Pt reports symptoms on R slightly worse than L.  Due to intensity of symptoms further treatment discontinued and pt monitored for remainder of session. Pt symptoms improved by end of session, however, pt still somewhat unsteady and dizzy. PT and SPT ambulated with pt to his car as pt declines to use transport chair/WC at this time. PT educated pt in post testing precautions, red flags, instructed pt to not try anything that could make him unsteady or challenge his balance for remainder of day and to take it easy.   PATIENT EDUCATION: Education details: purpose of positional testing, reviewed VOR technique and home exercise program Person educated: Patient Education method: Explanation, Demonstration, and Verbal cues Education comprehension: verbalized understanding, returned demonstration, and verbal cues required   HOME EXERCISE PROGRAM: No updates on this date, pt to continue as previously given 09/15/2022: Access Code: [email or web address] URL: [email or web address] Date: 09/15/2022 Prepared by: Ricard Dillon  Exercises - Seated Gaze Stabilization with Head Rotation  - 1 x daily - 7 x weekly - 3 sets - 2 reps - 30 sec hold    PT Short Term Goals       PT SHORT TERM GOAL #1   Title Patient will be independent in home exercise program to improve strength/mobility for better functional independence with ADLs and for self-management.    Baseline 3/3 HEP compliant 3/31 hep compliant    Time 6    Period Weeks    Status Achieved    Target Date 02/20/21      PT SHORT TERM GOAL #2   Title Patient will be modified independent in walking on even/uneven surface with least restrictive assistive device, for 10+ minutes without  rest break, reporting some difficulty or less to improve walking tolerance with community ambulation including grocery shopping, going to church,etc.    Baseline 02/03: instability, LOB multiple times while ambulating to elevator 3/31: unable to test due  to throwing up 6/20: 6 minutes 7/12: unable to test due to migraine 9/12: defer to next session 11/15: unable to test due to migraine 3/2: requires heavy use of AD and seated rest breaks 4/20: walked around yard yesterday    Time 6    Period Weeks    Status Partially Met    Target Date 03/19/22      PT SHORT TERM GOAL #3   Title Patient will increase ABC scale score >80% to demonstrate better functional mobility and better confidence with ADLs.    Baseline 01/09/21: 60.625% 3/3/: 50% 3/31: 63% 6/20: 45% 7/25: 40% 9/12: 51.1% 11/15: 48% 1/17: 40% 3/2: 33% 4/20: 39% 6/6: 51%    Time 6    Period Weeks    Status Partially Met    Target Date 06/23/22              PT Long Term Goals      PT LONG TERM GOAL #1   Title Patient will increase FOTO score to equal to or greater than 65 to demonstrate statistically significant improvement in mobility and quality of life.    Baseline scored 41/100 on 05/21/2020; 7/22 40, 8/23: 60/100, 09/30/20=40, 01/09/21: 46 3/3: 40.3% 3/31: 49% 6/20: 45% 7/12: 50.7% 9/12: 44.7% 11/15: 40% 1/17: 40% 3/2: 42%    Time 12    Period Weeks    Status Not Met    Target Date 10/20/22       PT LONG TERM GOAL #2   Title Patient will reduce falls risk as indicated by decreased TUG time to less than 11 seconds.    Baseline scored 37.9 sec with TUG on 05/21/2020; 21seconds with elevated rollator 7/22, 8/23: 13.62 sec with up and go walker, 01/09/21: 15.81s with hiking stick 3/3: 17.98 seconds one near LOB 3/31: 15.57 seconds with walking stick 6/20: 14.8 seconds with walking stick. 7/25: 19.72 seconds with walking stick. 9/26: 17.6 seconds 11/15: unable to test due to migraine 1/17: deferred due to pain 3/2:19.25 seconds  with walking stick in involved hand 4/20: deferred due to migraine and back pain 6/6: 10.5 seconds with walking stick    Time 12    Period Weeks    Status Achieved    Target Date 04/30/22      PT LONG TERM GOAL #3   Title Patient will increase right UE and LE gross strength to 4+/5 throughout to improve functional strength for independent gait, increased standing tolerance and increased ADL ability.    Baseline grossly +3/5 to -4/5 right UE and LE strength on 05/21/2020; grossly 4-/5 for RUE, grossly 4-/5 for RLE on 7/22, grossly 4-/5 RUE, grossly 4/5 RLE 3/3: see note 3/2: see note 4/20: RLE 4-/5 with quad 4/5 RUE 4/5 with bicep 3+/5 6/6: see note: 7/25: see note   Time 12    Period Weeks    Status Partially Met    Target Date 10/20/22     PT LONG TERM GOAL #4   Title Patient will increase 10 meter walk test to >1.90ms as to improve gait speed for better community ambulation and to reduce fall risk.    Baseline on 7/22 .57 m/sself selected, fast  .73 m/s with LOB pt reported R knee buckling, elevated rollator used, 8/23: 1.06 m/s with up and go walker, 01/09/21: 0.72 m/s with hiking stick 3/3: 0.74 m/s with walking stick 3/31: 0.81 m/s w walking stick 6/20: 1.1 m/s with walking stick    Time 12  Period Weeks    Status Achieved      PT LONG TERM GOAL #5   Title Patient will increase Berg Balance score by > 6 points to demonstrate decreased fall risk during functional activities.    Baseline 8/23: 37/56, 09/30/20=40/56, 12/20: 42/56 3/3: deferred 3/31 will perform next time; deferred due to throwing up 6/20: 46/56 7/12 38/56    Time 8    Period Weeks    Status Achieved      Additional Long Term Goals   Additional Long Term Goals Yes      PT LONG TERM GOAL #6   Title Patient (< 79 years old) will complete five times sit to stand test in < 10 seconds indicating an increased LE strength and improved balance.    Baseline 8/23: 22 sec, 09/30/20=13.04 sec, 12/20: 27.05 sec, 01/09/21:  18.01s 3/3: 23 seconds no UE support 3/31; deferred due to patient throwing up 7/12: 15.2 seconds 11/15: 26 seconds 1/17: deferred due to pain 3/2: 17.5 seconds 4/20: deferred due to migraine and back pain 6/6: 11 seconds with SUE support 8/22: 13.7 seconds with SUE support    Time 12    Period Weeks    Status Partially Met    Target Date 10/20/22     PT LONG TERM GOAL #7   Title Patient will improve 6 min walk test >1000 feet with LRAD for improved gait ability in community.    Baseline 8/23: not tested; 8/30 845, goal adjusted >2087f, 09/30/20=600 feet - stopped test due to R ankle catching making gait unsafe, 11/25/20= 330 feet - stopped test due to severe dizziness, vomiting/nausea, sweating making gait unsafe; 01/09/21: 720 ft with no rest breaks 3/3: deferred due to nausea 3/31: deferred due to patient throwing up 6/20: 640 ft 7/12: [id number] walking stick, no rest breaks; 9/26: 1000 ft    Time 12    Period Weeks    Status Achieved      PT LONG TERM GOAL #8   Title Patient will increase Berg Balance score by > 6 points  (52/56) to demonstrate decreased fall risk during functional activities.    Baseline 6/20: 46/56 7/12: 38/56 with migraine 9/12: defer to next session 11/15:  unable to test due to migraine 1/17: deferred due to pain 3/2: next session 4/20: deferred due to migraine and back pain    Time 12    Period Weeks    Status Partially Met    Target Date 10/20/22     PT LONG TERM GOAL  #9   TITLE Patient will report decreased frequency of falling per week to 1x/week for decreased fall risk and improved quality of life    Baseline 12/1: 4 falls minimum a week 1/17: 1 fall last week 3/2: has been bed bound the week prior 4/20: 2-3x/week 6/6: 2x/week 7/25: 2x/week   Time 12    Period Weeks    Status Partially Met    Target Date 08/04/22      PT LONG TERM GOAL  #10   TITLE Patient will turn head 75% of the time without dizziness or LOB.    Baseline 6/6: patient falls and has  extreme dizziness resulting in throwing up. 7/25: 25% of the time   Time 12    Period Weeks    Status Partially Met   Target Date 10/20/22     PT LONG TERM GOAL  #11   TITLE Patient will report a worst pain of 4/10 on VAS  in cervical and lumbar  to improve tolerance with ADLs and reduced symptoms with activities.    Baseline 6/6: lumbar: 10/10, cervical 8/10 7/25: lumbar 6-7/10; cervical 5-6/10    Time 12    Period Weeks    Status Partially Met   Target Date 10/20/22              Plan     Clinical Impression Statement Performed horizontal canal testing this date which was negative as no nystagmus was observed and patient denied vertigo. Repeated Dix-Hallpike testing and no nystagmus observed. Pt did report increased dizziness rated 7/10 with some vertigo in left Dix-Hallpike position but no nystagmus observed and dizziness persisted for greater than one minute. Reviewed VOR X 1 and provided cuing for technique. Pt unable to tolerate progressing VOR X 1 this date. Pt would benefit from continued PT services to further address functional deficits and to try to decrease patient's falls risk and subjective symptoms of imbalance and dizziness.    Personal Factors and Comorbidities Comorbidity 3+;Time since onset of injury/illness/exacerbation    Comorbidities anxiety, back pain, COPD, headache, HTN, MI, sleep apnea, migraines    Examination-Activity Limitations Locomotion Level;Transfers;Bed Mobility;Stand;Stairs;Sleep;Squat;Bend    Examination-Participation Restrictions Driving;Medication Management    Stability/Clinical Decision Making Unstable/Unpredictable    Rehab Potential Fair    PT Frequency 1x / week    PT Duration 12 weeks    PT Treatment/Interventions ADLs/Self Care Home Management;Canalith Repostioning;Moist Heat;Electrical Stimulation;Gait training;Stair training;Functional mobility training;Therapeutic activities;Therapeutic exercise;Balance training;Neuromuscular  re-education;Patient/family education;Manual techniques;Passive range of motion;Vestibular    PT Next Visit Plan VBI and Cervical ROM screen, maneuvers as approrpiate   PT Home Exercise Plan Medbridge Access Code: [email or web address]    Consulted and Agree with Plan of Care Patient;Family member/caregiver    Family Member Consulted Wife               Lady Deutscher, PT 10/13/2022, 4:00 PM

## 2022-10-19 ENCOUNTER — Other Ambulatory Visit: Payer: Self-pay

## 2022-10-19 MED ORDER — EZETIMIBE 10 MG PO TABS: 10.0000 mg | ORAL_TABLET | Freq: Every day | ORAL | 0 refills | Status: DC

## 2022-10-20 ENCOUNTER — Ambulatory Visit: Payer: BC Managed Care – PPO

## 2022-10-27 ENCOUNTER — Ambulatory Visit: Payer: BC Managed Care – PPO

## 2022-11-03 ENCOUNTER — Ambulatory Visit: Payer: BC Managed Care – PPO

## 2022-11-03 DIAGNOSIS — M542 Cervicalgia: Secondary | ICD-10-CM

## 2022-11-03 DIAGNOSIS — R2681 Unsteadiness on feet: Secondary | ICD-10-CM

## 2022-11-03 DIAGNOSIS — R42 Dizziness and giddiness: Secondary | ICD-10-CM

## 2022-11-03 DIAGNOSIS — R262 Difficulty in walking, not elsewhere classified: Secondary | ICD-10-CM

## 2022-11-03 NOTE — Therapy (Signed)
OUTPATIENT PHYSICAL THERAPY TREATMENT NOTE/RECERT   Patient Name: Cory Espinoza MRN: [id number] DOB:1971-07-03, 51 y.o., male Today's Date: 11/03/2022  PCP: [place] Pink MD REFERRING PROVIDER: Jennings Books MD      PT End of Session - 11/03/22 1720     Visit Number 90    Number of Visits 110    Date for PT Re-Evaluation 01/26/23    Authorization Type BC[employer] PPO;    PT Start Time 1610    PT Stop Time 1700    PT Time Calculation (min) 50 min    Equipment Utilized During Treatment Gait belt    Activity Tolerance Other (comment);Patient tolerated treatment well   limited by intermittent nausea and dizziness   Behavior During Therapy [facility] for tasks assessed/performed             Past Medical History:  Diagnosis Date   Allergy    Anginal pain (HCC)    Anxiety    Arthritis    lumbar spine   Asthma    Atrial fibrillation (HCC)    Back pain    Severe Lumbar Pain   CHF (congestive heart failure) (HCC)    COPD (chronic obstructive pulmonary disease) (HCC)    Restrictive lung disease   Coronary artery disease    Dyspnea    Dysrhythmia    afib   GERD (gastroesophageal reflux disease)    Headache    [name], onset October 2020, cluster headaches in the past   History of kidney stones    Hyperlipidemia    Hypertension    Myocardial infarction ([place])    at age 34   Pneumonia    Restrictive lung disease    Sleep apnea    unable to use cpap since onset of migraines   Past Surgical History:  Procedure Laterality Date   ABDOMINAL EXPOSURE N/A 03/25/2020   Procedure: ABDOMINAL EXPOSURE;  Surgeon: Rosetta Posner, MD;  Location: [facility] OR;  Service: Vascular;  Laterality: N/A;   ANTERIOR CERVICAL DECOMP/DISCECTOMY FUSION N/A 01/23/2020   Procedure: ANTERIOR CERVICAL DECOMPRESSION FUSION - CERVICAL SIX-CERVICAL SEVEN;  Surgeon: Earnie Larsson, MD;  Location: [place];  Service: Neurosurgery;  Laterality: N/A;   ANTERIOR LUMBAR FUSION N/A 03/25/2020   Procedure: ANTERIOR LUMBAR  INTERBODY FUSION LUMBAR FIVE-SACRAL ONE.;  Surgeon: Earnie Larsson, MD;  Location: [facility];  Service: Neurosurgery;  Laterality: N/A;  anterior   BACK SURGERY  1996   CARDIAC CATHETERIZATION Left 04/27/2016   Procedure: Left Heart Cath and Coronary Angiography;  Surgeon: Corey Skains, MD;  Location: [facility];  Service: Cardiovascular;  Laterality: Left;   CARDIAC CATHETERIZATION N/A 04/27/2016   Procedure: Intravascular Pressure Wire/FFR Study;  Surgeon: Yolonda Kida, MD;  Location: [facility];  Service: Cardiovascular;  Laterality: N/A;   CATHETERIZATION OF PULMONARY ARTERY WITH RETRIEVAL OF FOREIGN BODY Bilateral 04/07/2011   heart   DEGENERATIVE SPONDYLOLISTHESIS     KNEE ARTHROSCOPY Bilateral 2004   LUMBAR DISC SURGERY  1999   RADICULOPATHY, CERVICAL REGION     TONSILLECTOMY     Patient Active Problem List   Diagnosis Date Noted   [name] (coronary artery disease) 10/21/2021   Degenerative spondylolisthesis 03/25/2020   Physical deconditioning 03/04/2020   Lobar pneumonia, unspecified organism ([place]) 03/04/2020   Intractable hemiplegic migraine with status migrainosus 03/02/2020   Current tobacco use 02/05/2020   Abnormal findings on diagnostic imaging of lung 02/05/2020   Shortness of breath 02/05/2020   Herniated disc, cervical 01/23/2020  Cervical spondylosis with myelopathy and radiculopathy 01/23/2020   Pre-operative respiratory examination 01/12/2020   Weakness on right side of face 12/22/2019   Osteoarthritis of spine with radiculopathy, lumbar region 12/11/2019   Intractable migraine with aura with status migrainosus 11/09/2019   Former smoker 08/27/2018   Benign essential HTN 08/27/2018   Cough productive of purulent sputum 11/08/2017   GERD (gastroesophageal reflux disease) 05/06/2017   Contusion of hand 04/22/2017   BP (high blood pressure) 04/22/2017   Oxygen desaturation 04/22/2017   Prostate lump 04/22/2017   Unstable angina ([place]) 04/27/2016    Chest pain 04/24/2016   Morbid obesity ([place]) 01/29/2016   Elevated ALT measurement 10/30/2015   Nonspecific elevation of levels of transaminase and lactic acid dehydrogenase (ldh) 10/30/2015   Reduced libido 10/29/2015   Hyperlipidemia 08/06/2015   Anxiety 08/06/2015   Atherosclerosis of coronary artery 08/06/2015   Childhood asthma 08/06/2015   Chronic obstructive pulmonary disease ([place]) 08/06/2015   OSA (obstructive sleep apnea) 08/06/2015   Anxiety disorder 08/06/2015   Atherosclerosis of native coronary artery of native heart with stable angina pectoris ([place]) 61/60/7371   Uncomplicated asthma 06/01/9484    REFERRING DIAG: BPPV/unsteadiness  THERAPY DIAG:  Dizziness and giddiness  Unsteadiness on feet  Difficulty in walking, not elsewhere classified  Cervicalgia  Rationale for Evaluation and Treatment Rehabilitation  PERTINENT HISTORY: Pt has a complex medical history. 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 He states that he fell 10-12 feet and struck his head, neck, and back. He was able to finish the course which took him approximately 45 minutes more to complete. He does endorse a second fall while finishing the course but did not suffer a second head injury. He states that he started getting headaches that day which have persisted since that time. 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 He does report improvement in his headaches recently. Neurology was concerned about possible BPPV and have referred him for a vestibular evaluation. In addition since the injury, pt developed RUE and RLE pain and weakness. Pt states that NCV showed abnormal nerve conduction in RUE. He has since undergone a C7-7 ACDF on 01/23/20. He reports initial improvement in RUE strength, but states that he has a gradual return of his RUE weakness. He was also having significant RLE weakness  and pain and underwent and L5-S1 anterior lumbar interbody fusion on 03/25/20. Patient reports that he does not have any back precautions but needs to wear the low back brace for one more month. Patient reports he has a follow-up appointment with the surgeon next month. Patient reports he has a follow-up appointment with Dr. Brigitte Pulse, neurologist, in August. He arrives to therapy ambulating with an upright rollator walker. Pt denies any mention of concussion after his injury. He has been having cognitive issues since his injury and has previously had a heavy metals screen as well as neurocognitive testing. He has had an MRI of his brain with and without contrast on 12/18/19. Results were mildly motion degraded examination. No evidence of acute intracranial abnormality. Minimal chronic small vessel ischemic disease. He has tremor in both his hands. He has a positive family history of Parkinson's disease in his grandfather. He is complaining of constant dizziness and unsteadiness which are worse with activity. Patient states that he frequently loses his balance and his wife has to steady him.   Describe your  dizziness without using the term "dizzy:" Pt states he feels unsteady, spinning, nausea,swimmy-headed at times.  He reports spinning can last minutes or longer. Pt is taking meclizine, reports he usually takes prior to PT, including today. Pt has an inhaler with peppermint he uses for nausea. Pt has vomited before due to vertigo. Vertigo has been on and off since 2020. Pt spouse reports pt fell a couple days ago at night going to the bathroom. Pt hit his head. He states "too many falls in the last six months." Pt cannot always get up when falls to the floor. Pt spouse reports pt had recent seizure that lasted longer than normal (PTSD type seizures). Seizures(?) typically can last from 10 to 24 hours. Yawning frequently is a sign of oncoming seizure for pt. Pt has interrupted sleep. He has neck pain but has been using  a massager for his neck and found this to help a bit. Some days his neck pain ranges from 1-2/10, some days 7-8/10. Pt can experience double vision with migraines.Other symptoms include impaired sensation in R leg.  How long does your vertigo last (seconds, hours, >24 hours): Has experienced spinning that lasts over 24 hours  Any other symptoms associated with your dizziness? Unsure if dizzier with migraine symptoms   Do you have trouble with going into busy stores, using screens, or being a passenger in a car and seeing other cars go by?  -issues with being a passenger in a car and watching cars go by  -no issue with screens   -issue with busy stores   What increases likelihood of a migraine? Constantly has a migraine, pt reports the intensity can improve, he has come down to a 2/10 before, can be 5-10/10. Pt getting infusions currently, and thinks they're working. Strong smells can worsen migraine.   What tends to make it more likely you'll have a seizure? Flashing lights, stress and when pt feels angry reports he can have some trouble controlling emotions at times.  PRECAUTIONS: Falls  SUBJECTIVE:  Pt reports multiple falls since last appointment. One fall today when walking to the kitchen, coming up some steps and fell into the refrigerator but did not fall to the floor. Another fall when chasing his granddaughter, pt tripped over his walking stick and fell to the floor. Has had a few migraines recently, did have one this morning at 3 am. Took his medication to assist with the nausea prior to appointment. Reports he has improved speed with HEP.  PAIN:  Are you having pain? Yes: NPRS scale: not rated/10 Pain location: head Pain description: migraine Aggravating factors: turning head, weather Relieving factors: medicine, dark room  TODAY'S TREATMENT:    11/03/2022   NMR Seated EC horizontal head turns 5x, slowly. Nausea reaches 8/10. Seated EC vertical head turns 5x, slowly Seated  EO horizontal head turns 5x 2 sets, slowly. Seated EO vertical head turns 5x 2 sets, slowly Seated VORx1 horizontal head turns 3x15-17 seconds, 1x30 sec slow speed within symptom tolerance  Seated VORx1 vertical head turns 3x15-17 seconds, 1x30 sec slow speed. Reports easier than horizontal head turns Recovery intervals taken throughout for symptoms modulation Further education on technique with interventions.  Discussed pt using walker following performing rehab exercises due to increased unsteadiness with symptom provocation. Pt verbalizes understanding, will possibly be agreeable to plan but he is uncertain.  Provided education on how to modify interventions, including HEP, to improve symptoms tolerance and when to rest.  Manual Therapy- Seated, tool assisted  STM applied to B UT and B cervical paraspinals. Most tenderness noted in B cervical paraspinals. Addition of TrP release to these regions.   PT x2 ambulated with pt to his car at end of session. Only 2 instances of unsteadiness requiring mod a to correct.    10/13/2022:  BPPV TESTS:  Performed on inverted mat table and decreased lighting level in room.  Symptoms Duration Intensity Nystagmus  Left Dix-Hallpike Dizziness with some vertigo >1 minute 7/10 None nystagmus observed but noted darting of eyes  Right Dix-Hallpike Dizziness; denies vertigo N/A N/A None observed  Left Head Roll No increase from baseline dizziness this date N/A N/A None observed  Right Head Roll No increase from baseline dizziness this date N/A N/A None observed   VOR X 1 exercise:  Pt performed VOR X 1 horizontal in sitting and required verbal cuing for technique as initially patient was not keeping his eyes on the target and with too much head excursion moving into his peripheral visual field. After verbal cuing provided, patient was able to perform multiple 30-45 second reps.  Patient required rest breaks between reps secondary to mild nausea and increased  dizziness rated 5/10. Patient had marked increase in his symptoms with 45 second rep with increased nausea with dry heaves; therefore recommend patient continue 30 second reps at home in supported sitting with plain background.  Patient reports history of multiple falls in the past. Discussed strategies for transfers from floor with patient and his wife.  PT ambulated with pt to his car >1000' as pt declines to use transport chair at this time. Pt ambulates with walking stick with uneven steppage and mild unsteadiness noted with CGA to Mod A secondary to patient had one loss of balance requiring Mod A to correct when entering the elevator. Pt required CGA with car transfer.   11/ / 23: Gait belt donned and CGA-mod a provided throughout (SPT assisting PT this session with guarding).   Pt on tilt table: Loaded R Dix-Hallpike - no nystagmus noted, however, pt reports feeling dizzy and nauseated.  Loaded L Dix-Hallpike with vestibular goggled donned - no clear nystagmus noted in supine position, however, pt reports feeling dizzy and nauseated. He did exhibit possible horizontal nystagmus when initially sitting up from R Dix-Hallpike and then rotating his head to L.  Pt reports symptoms on R slightly worse than L.  Due to intensity of symptoms further treatment discontinued and pt monitored for remainder of session. Pt symptoms improved by end of session, however, pt still somewhat unsteady and dizzy. PT and SPT ambulated with pt to his car as pt declines to use transport chair/WC at this time. PT educated pt in post testing precautions, red flags, instructed pt to not try anything that could make him unsteady or challenge his balance for remainder of day and to take it easy.   PATIENT EDUCATION: Education details: purpose of positional testing, reviewed VOR technique and home exercise program Person educated: Patient Education method: Explanation, Demonstration, and Verbal cues Education  comprehension: verbalized understanding, returned demonstration, and verbal cues required   HOME EXERCISE PROGRAM: No updates on this date, pt to continue as previously given 09/15/2022: Access Code: [email or web address] URL: [email or web address] Date: 09/15/2022 Prepared by: Ricard Dillon  Exercises - Seated Gaze Stabilization with Head Rotation  - 1 x daily - 7 x weekly - 3 sets - 2 reps - 30 sec hold    PT Short Term Goals  target date 12/15/2022  PT SHORT TERM GOAL #1   Title Patient will be independent in home exercise program to improve strength/mobility for better functional independence with ADLs and for self-management.    Baseline 3/3 HEP compliant 3/31 hep compliant    Time 6    Period Weeks    Status Achieved    Target Date 02/20/21      PT SHORT TERM GOAL #2   Title Patient will be modified independent in walking on even/uneven surface with least restrictive assistive device, for 10+ minutes without rest break, reporting some difficulty or less to improve walking tolerance with community ambulation including grocery shopping, going to church,etc.    Baseline 02/03: instability, LOB multiple times while ambulating to elevator 3/31: unable to test due to throwing up 6/20: 6 minutes 7/12: unable to test due to migraine 9/12: defer to next session 11/15: unable to test due to migraine 3/2: requires heavy use of AD and seated rest breaks 4/20: walked around yard yesterday    Time 6    Period Weeks    Status Previously partially Met  - now discontinued as pt focusing on VRT   Target Date 03/19/22      PT SHORT TERM GOAL #3   Title Patient will increase ABC scale score >80% to demonstrate better functional mobility and better confidence with ADLs.    Baseline 01/09/21: 60.625% 3/3/: 50% 3/31: 63% 6/20: 45% 7/25: 40% 9/12: 51.1% 11/15: 48% 1/17: 40% 3/2: 33% 4/20: 39% 6/6: 51%    Time 6    Period Weeks    Status Partially Met    Target Date 06/23/22               PT Long Term Goals - target date 01/26/2023        PT LONG TERM GOAL #1   Title Patient will increase FOTO score to equal to or greater than 65 to demonstrate statistically significant improvement in mobility and quality of life.    Baseline scored 41/100 on 05/21/2020; 7/22 40, 8/23: 60/100, 09/30/20=40, 01/09/21: 46 3/3: 40.3% 3/31: 49% 6/20: 45% 7/12: 50.7% 9/12: 44.7% 11/15: 40% 1/17: 40% 3/2: 42%    Time 12    Period Weeks    Status Not Met    Target Date 10/20/22       PT LONG TERM GOAL #2   Title Patient will reduce falls risk as indicated by decreased TUG time to less than 11 seconds.    Baseline scored 37.9 sec with TUG on 05/21/2020; 21seconds with elevated rollator 7/22, 8/23: 13.62 sec with up and go walker, 01/09/21: 15.81s with hiking stick 3/3: 17.98 seconds one near LOB 3/31: 15.57 seconds with walking stick 6/20: 14.8 seconds with walking stick. 7/25: 19.72 seconds with walking stick. 9/26: 17.6 seconds 11/15: unable to test due to migraine 1/17: deferred due to pain 3/2:19.25 seconds with walking stick in involved hand 4/20: deferred due to migraine and back pain 6/6: 10.5 seconds with walking stick    Time 12    Period Weeks    Status Achieved    Target Date 04/30/22      PT LONG TERM GOAL #3   Title Patient will increase right UE and LE gross strength to 4+/5 throughout to improve functional strength for independent gait, increased standing tolerance and increased ADL ability.    Baseline grossly +3/5 to -4/5 right UE and LE strength on 05/21/2020; grossly 4-/5 for RUE, grossly 4-/5 for RLE on 7/22, grossly 4-/5 RUE, grossly  4/5 RLE 3/3: see note 3/2: see note 4/20: RLE 4-/5 with quad 4/5 RUE 4/5 with bicep 3+/5 6/6: see note: 7/25: see note   Time 12    Period Weeks    Status Partially Met - now discontinued as pt focusing on VRT   Target Date 10/20/22     PT LONG TERM GOAL #4   Title Patient will increase 10 meter walk test to >1.17ms as to improve gait speed  for better community ambulation and to reduce fall risk.    Baseline on 7/22 .57 m/sself selected, fast  .73 m/s with LOB pt reported R knee buckling, elevated rollator used, 8/23: 1.06 m/s with up and go walker, 01/09/21: 0.72 m/s with hiking stick 3/3: 0.74 m/s with walking stick 3/31: 0.81 m/s w walking stick 6/20: 1.1 m/s with walking stick    Time 12    Period Weeks    Status Achieved      PT LONG TERM GOAL #5   Title Patient will increase Berg Balance score by > 6 points to demonstrate decreased fall risk during functional activities.    Baseline 8/23: 37/56, 09/30/20=40/56, 12/20: 42/56 3/3: deferred 3/31 will perform next time; deferred due to throwing up 6/20: 46/56 7/12 38/56    Time 8    Period Weeks    Status Achieved      Additional Long Term Goals   Additional Long Term Goals Yes      PT LONG TERM GOAL #6   Title Patient (< 643years old) will complete five times sit to stand test in < 10 seconds indicating an increased LE strength and improved balance.    Baseline 8/23: 22 sec, 09/30/20=13.04 sec, 12/20: 27.05 sec, 01/09/21: 18.01s 3/3: 23 seconds no UE support 3/31; deferred due to patient throwing up 7/12: 15.2 seconds 11/15: 26 seconds 1/17: deferred due to pain 3/2: 17.5 seconds 4/20: deferred due to migraine and back pain 6/6: 11 seconds with SUE support 8/22: 13.7 seconds with SUE support    Time 12    Period Weeks    Status Partially Met    Target Date 10/20/22     PT LONG TERM GOAL #7   Title Patient will improve 6 min walk test >1000 feet with LRAD for improved gait ability in community.    Baseline 8/23: not tested; 8/30 845, goal adjusted >20026f 09/30/20=600 feet - stopped test due to R ankle catching making gait unsafe, 11/25/20= 330 feet - stopped test due to severe dizziness, vomiting/nausea, sweating making gait unsafe; 01/09/21: 720 ft with no rest breaks 3/3: deferred due to nausea 3/31: deferred due to patient throwing up 6/20: 640 ft 7/12: [id number]  walking stick, no rest breaks; 9/26: 1000 ft    Time 12    Period Weeks    Status Achieved      PT LONG TERM GOAL #8   Title Patient will increase Berg Balance score by > 6 points  (52/56) to demonstrate decreased fall risk during functional activities.    Baseline 6/20: 46/56 7/12: 38/56 with migraine 9/12: defer to next session 11/15:  unable to test due to migraine 1/17: deferred due to pain 3/2: next session 4/20: deferred due to migraine and back pain    Time 12    Period Weeks    Status Partially Met    Target Date 10/20/22     PT LONG TERM GOAL  #9   TITLE Patient will report decreased frequency of falling per  week to 1x/week for decreased fall risk and improved quality of life    Baseline 12/1: 4 falls minimum a week 1/17: 1 fall last week 3/2: has been bed bound the week prior 4/20: 2-3x/week 6/6: 2x/week 7/25: 2x/week; 11/28: average 3 falls/week   Time 12    Period Weeks    Status Partially Met    Target Date 08/04/22      PT LONG TERM GOAL  #10   TITLE Patient will turn head 75% of the time without dizziness or LOB.    Baseline 6/6: patient falls and has extreme dizziness resulting in throwing up. 7/25: 25% of the time; 11/28: "it happens a lot 65% of the time"   Time 12    Period Weeks    Status Partially Met   Target Date 10/20/22     PT LONG TERM GOAL  #11   TITLE Patient will report a worst pain of 4/10 on VAS in cervical and lumbar  to improve tolerance with ADLs and reduced symptoms with activities.    Baseline 6/6: lumbar: 10/10, cervical 8/10 7/25: lumbar 6-7/10; cervical 5-6/10    Time 12    Period Weeks    Status Partially Met   Target Date 10/20/22              Plan     Clinical Impression Statement Initiated goal retesting for recert (see above), however, most goals deferred as pt missed recent sessions due to migraines and benefited from instruction in habituation interventions and VOR. Pt exhibits increased tolerance for habituation and VOR  interventions on this date, although he still requires frequent rest intervals and did experience symptom increase. Despite this, pt with steadier gate with ambulating to his car (PTs assisted) compared to prior sessions.  Pt would benefit from continued PT services to further address functional deficits and to try to decrease patient's falls risk and subjective symptoms of imbalance and dizziness. Patient's condition has the potential to improve in response to therapy. Maximum improvement is yet to be obtained. The anticipated improvement is attainable and reasonable in a generally predictable time.      Personal Factors and Comorbidities Comorbidity 3+;Time since onset of injury/illness/exacerbation    Comorbidities anxiety, back pain, COPD, headache, HTN, MI, sleep apnea, migraines    Examination-Activity Limitations Locomotion Level;Transfers;Bed Mobility;Stand;Stairs;Sleep;Squat;Bend    Examination-Participation Restrictions Driving;Medication Management    Stability/Clinical Decision Making Unstable/Unpredictable    Rehab Potential Fair    PT Frequency 1x / week    PT Duration 12 weeks    PT Treatment/Interventions ADLs/Self Care Home Management;Canalith Repostioning;Moist Heat;Electrical Stimulation;Gait training;Stair training;Functional mobility training;Therapeutic activities;Therapeutic exercise;Balance training;Neuromuscular re-education;Patient/family education;Manual techniques;Passive range of motion;Vestibular    PT Next Visit Plan VBI and Cervical ROM screen, maneuvers as approrpiate   PT Home Exercise Plan Medbridge Access Code: [email or web address]    Consulted and Agree with Plan of Care Patient;Family member/caregiver    Family Member Consulted Wife               Ricard Dillon PT, DPT  11/03/2022, 5:33 PM

## 2022-11-10 ENCOUNTER — Ambulatory Visit: Payer: BC Managed Care – PPO

## 2022-11-12 ENCOUNTER — Ambulatory Visit: Payer: BC Managed Care – PPO

## 2022-11-16 ENCOUNTER — Encounter: Payer: Self-pay | Admitting: Cardiovascular Disease

## 2022-11-16 ENCOUNTER — Ambulatory Visit: Payer: BC Managed Care – PPO

## 2022-11-17 ENCOUNTER — Ambulatory Visit: Payer: BC Managed Care – PPO

## 2022-11-18 MED ORDER — METOPROLOL SUCCINATE ER 50 MG PO TB24: 50.0000 mg | ORAL_TABLET | Freq: Every day | ORAL | 0 refills | Status: DC

## 2022-11-24 ENCOUNTER — Ambulatory Visit: Payer: BC Managed Care – PPO

## 2022-12-01 ENCOUNTER — Ambulatory Visit: Payer: BC Managed Care – PPO

## 2022-12-01 NOTE — Therapy (Incomplete)
OUTPATIENT PHYSICAL THERAPY TREATMENT NOTE   Patient Name: Cory Espinoza MRN: [id number] DOB:1971-04-19, 51 y.o., male Today's Date: 11/03/2022  PCP: [place] Pink MD REFERRING PROVIDER: Jennings Books MD      PT End of Session - 11/03/22 1720     Visit Number 25    Number of Visits 110    Date for PT Re-Evaluation 01/26/23    Authorization Type [employer] PPO;    PT Start Time 1610    PT Stop Time 1700    PT Time Calculation (min) 50 min    Equipment Utilized During Treatment Gait belt    Activity Tolerance Other (comment);Patient tolerated treatment well   limited by intermittent nausea and dizziness   Behavior During Therapy [facility] for tasks assessed/performed             Past Medical History:  Diagnosis Date   Allergy    Anginal pain (HCC)    Anxiety    Arthritis    lumbar spine   Asthma    Atrial fibrillation (HCC)    Back pain    Severe Lumbar Pain   CHF (congestive heart failure) (HCC)    COPD (chronic obstructive pulmonary disease) (HCC)    Restrictive lung disease   Coronary artery disease    Dyspnea    Dysrhythmia    afib   GERD (gastroesophageal reflux disease)    Headache    [name], onset October 2020, cluster headaches in the past   History of kidney stones    Hyperlipidemia    Hypertension    Myocardial infarction ([place])    at age 54   Pneumonia    Restrictive lung disease    Sleep apnea    unable to use cpap since onset of migraines   Past Surgical History:  Procedure Laterality Date   ABDOMINAL EXPOSURE N/A 03/25/2020   Procedure: ABDOMINAL EXPOSURE;  Surgeon: Rosetta Posner, MD;  Location: [facility] OR;  Service: Vascular;  Laterality: N/A;   ANTERIOR CERVICAL DECOMP/DISCECTOMY FUSION N/A 01/23/2020   Procedure: ANTERIOR CERVICAL DECOMPRESSION FUSION - CERVICAL SIX-CERVICAL SEVEN;  Surgeon: Earnie Larsson, MD;  Location: [facility];  Service: Neurosurgery;  Laterality: N/A;   ANTERIOR LUMBAR FUSION N/A 03/25/2020   Procedure: ANTERIOR LUMBAR  INTERBODY FUSION LUMBAR FIVE-SACRAL ONE.;  Surgeon: Earnie Larsson, MD;  Location: [facility];  Service: Neurosurgery;  Laterality: N/A;  anterior   BACK SURGERY  1996   CARDIAC CATHETERIZATION Left 04/27/2016   Procedure: Left Heart Cath and Coronary Angiography;  Surgeon: Corey Skains, MD;  Location: [facility];  Service: Cardiovascular;  Laterality: Left;   CARDIAC CATHETERIZATION N/A 04/27/2016   Procedure: Intravascular Pressure Wire/FFR Study;  Surgeon: Yolonda Kida, MD;  Location: [facility];  Service: Cardiovascular;  Laterality: N/A;   CATHETERIZATION OF PULMONARY ARTERY WITH RETRIEVAL OF FOREIGN BODY Bilateral 04/07/2011   heart   DEGENERATIVE SPONDYLOLISTHESIS     KNEE ARTHROSCOPY Bilateral 2004   LUMBAR DISC SURGERY  1999   RADICULOPATHY, CERVICAL REGION     TONSILLECTOMY     Patient Active Problem List   Diagnosis Date Noted   [name] (coronary artery disease) 10/21/2021   Degenerative spondylolisthesis 03/25/2020   Physical deconditioning 03/04/2020   Lobar pneumonia, unspecified organism ([place]) 03/04/2020   Intractable hemiplegic migraine with status migrainosus 03/02/2020   Current tobacco use 02/05/2020   Abnormal findings on diagnostic imaging of lung 02/05/2020   Shortness of breath 02/05/2020   Herniated disc, cervical 01/23/2020  Cervical spondylosis with myelopathy and radiculopathy 01/23/2020   Pre-operative respiratory examination 01/12/2020   Weakness on right side of face 12/22/2019   Osteoarthritis of spine with radiculopathy, lumbar region 12/11/2019   Intractable migraine with aura with status migrainosus 11/09/2019   Former smoker 08/27/2018   Benign essential HTN 08/27/2018   Cough productive of purulent sputum 11/08/2017   GERD (gastroesophageal reflux disease) 05/06/2017   Contusion of hand 04/22/2017   BP (high blood pressure) 04/22/2017   Oxygen desaturation 04/22/2017   Prostate lump 04/22/2017   Unstable angina ([place]) 04/27/2016    Chest pain 04/24/2016   Morbid obesity ([place]) 01/29/2016   Elevated ALT measurement 10/30/2015   Nonspecific elevation of levels of transaminase and lactic acid dehydrogenase (ldh) 10/30/2015   Reduced libido 10/29/2015   Hyperlipidemia 08/06/2015   Anxiety 08/06/2015   Atherosclerosis of coronary artery 08/06/2015   Childhood asthma 08/06/2015   Chronic obstructive pulmonary disease ([place]) 08/06/2015   OSA (obstructive sleep apnea) 08/06/2015   Anxiety disorder 08/06/2015   Atherosclerosis of native coronary artery of native heart with stable angina pectoris ([place]) 61/60/7371   Uncomplicated asthma 06/01/9484    REFERRING DIAG: BPPV/unsteadiness  THERAPY DIAG:  Dizziness and giddiness  Unsteadiness on feet  Difficulty in walking, not elsewhere classified  Cervicalgia  Rationale for Evaluation and Treatment Rehabilitation  PERTINENT HISTORY: Pt has a complex medical history. 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 He states that he fell 10-12 feet and struck his head, neck, and back. He was able to finish the course which took him approximately 45 minutes more to complete. He does endorse a second fall while finishing the course but did not suffer a second head injury. He states that he started getting headaches that day which have persisted since that time. 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 He does report improvement in his headaches recently. Neurology was concerned about possible BPPV and have referred him for a vestibular evaluation. In addition since the injury, pt developed RUE and RLE pain and weakness. Pt states that NCV showed abnormal nerve conduction in RUE. He has since undergone a C7-7 ACDF on 01/23/20. He reports initial improvement in RUE strength, but states that he has a gradual return of his RUE weakness. He was also having significant RLE weakness  and pain and underwent and L5-S1 anterior lumbar interbody fusion on 03/25/20. Patient reports that he does not have any back precautions but needs to wear the low back brace for one more month. Patient reports he has a follow-up appointment with the surgeon next month. Patient reports he has a follow-up appointment with Dr. Brigitte Pulse, neurologist, in August. He arrives to therapy ambulating with an upright rollator walker. Pt denies any mention of concussion after his injury. He has been having cognitive issues since his injury and has previously had a heavy metals screen as well as neurocognitive testing. He has had an MRI of his brain with and without contrast on 12/18/19. Results were mildly motion degraded examination. No evidence of acute intracranial abnormality. Minimal chronic small vessel ischemic disease. He has tremor in both his hands. He has a positive family history of Parkinson's disease in his grandfather. He is complaining of constant dizziness and unsteadiness which are worse with activity. Patient states that he frequently loses his balance and his wife has to steady him.   Describe your  dizziness without using the term "dizzy:" Pt states he feels unsteady, spinning, nausea,swimmy-headed at times.  He reports spinning can last minutes or longer. Pt is taking meclizine, reports he usually takes prior to PT, including today. Pt has an inhaler with peppermint he uses for nausea. Pt has vomited before due to vertigo. Vertigo has been on and off since 2020. Pt spouse reports pt fell a couple days ago at night going to the bathroom. Pt hit his head. He states "too many falls in the last six months." Pt cannot always get up when falls to the floor. Pt spouse reports pt had recent seizure that lasted longer than normal (PTSD type seizures). Seizures(?) typically can last from 10 to 24 hours. Yawning frequently is a sign of oncoming seizure for pt. Pt has interrupted sleep. He has neck pain but has been using  a massager for his neck and found this to help a bit. Some days his neck pain ranges from 1-2/10, some days 7-8/10. Pt can experience double vision with migraines.Other symptoms include impaired sensation in R leg.  How long does your vertigo last (seconds, hours, >24 hours): Has experienced spinning that lasts over 24 hours  Any other symptoms associated with your dizziness? Unsure if dizzier with migraine symptoms   Do you have trouble with going into busy stores, using screens, or being a passenger in a car and seeing other cars go by?  -issues with being a passenger in a car and watching cars go by  -no issue with screens   -issue with busy stores   What increases likelihood of a migraine? Constantly has a migraine, pt reports the intensity can improve, he has come down to a 2/10 before, can be 5-10/10. Pt getting infusions currently, and thinks they're working. Strong smells can worsen migraine.   What tends to make it more likely you'll have a seizure? Flashing lights, stress and when pt feels angry reports he can have some trouble controlling emotions at times.  PRECAUTIONS: Falls  SUBJECTIVE: *** Pt reports multiple falls since last appointment. One fall today when walking to the kitchen, coming up some steps and fell into the refrigerator but did not fall to the floor. Another fall when chasing his granddaughter, pt tripped over his walking stick and fell to the floor. Has had a few migraines recently, did have one this morning at 3 am. Took his medication to assist with the nausea prior to appointment. Reports he has improved speed with HEP.  PAIN:  Are you having pain? Yes: NPRS scale: not rated/10 Pain location: head Pain description: migraine Aggravating factors: turning head, weather Relieving factors: medicine, dark room  TODAY'S TREATMENT:    ***retest goals (missed previously at recert_  NMR Seated EC horizontal head turns 5x, slowly. Nausea reaches 8/10. Seated EC  vertical head turns 5x, slowly Seated EO horizontal head turns 5x 2 sets, slowly. Seated EO vertical head turns 5x 2 sets, slowly Seated VORx1 horizontal head turns 3x15-17 seconds, 1x30 sec slow speed within symptom tolerance  Seated VORx1 vertical head turns 3x15-17 seconds, 1x30 sec slow speed. Reports easier than horizontal head turns Recovery intervals taken throughout for symptoms modulation Further education on technique with interventions.  Discussed pt using walker following performing rehab exercises due to increased unsteadiness with symptom provocation. Pt verbalizes understanding, will possibly be agreeable to plan but he is uncertain.  Provided education on how to modify interventions, including HEP, to improve symptoms tolerance and when to rest.  Manual  Therapy- Seated, tool assisted STM applied to B UT and B cervical paraspinals. Most tenderness noted in B cervical paraspinals. Addition of TrP release to these regions.   PT x2 ambulated with pt to his car at end of session. Only 2 instances of unsteadiness requiring mod a to correct.    10/13/2022:  BPPV TESTS:  Performed on inverted mat table and decreased lighting level in room.  Symptoms Duration Intensity Nystagmus  Left Dix-Hallpike Dizziness with some vertigo >1 minute 7/10 None nystagmus observed but noted darting of eyes  Right Dix-Hallpike Dizziness; denies vertigo N/A N/A None observed  Left Head Roll No increase from baseline dizziness this date N/A N/A None observed  Right Head Roll No increase from baseline dizziness this date N/A N/A None observed   VOR X 1 exercise:  Pt performed VOR X 1 horizontal in sitting and required verbal cuing for technique as initially patient was not keeping his eyes on the target and with too much head excursion moving into his peripheral visual field. After verbal cuing provided, patient was able to perform multiple 30-45 second reps.  Patient required rest breaks between reps  secondary to mild nausea and increased dizziness rated 5/10. Patient had marked increase in his symptoms with 45 second rep with increased nausea with dry heaves; therefore recommend patient continue 30 second reps at home in supported sitting with plain background.  Patient reports history of multiple falls in the past. Discussed strategies for transfers from floor with patient and his wife.  PT ambulated with pt to his car >1000' as pt declines to use transport chair at this time. Pt ambulates with walking stick with uneven steppage and mild unsteadiness noted with CGA to Mod A secondary to patient had one loss of balance requiring Mod A to correct when entering the elevator. Pt required CGA with car transfer.    PATIENT EDUCATION: Education details: purpose of positional testing, reviewed VOR technique and home exercise program Person educated: Patient Education method: Explanation, Demonstration, and Verbal cues Education comprehension: verbalized understanding, returned demonstration, and verbal cues required   HOME EXERCISE PROGRAM: No updates on this date, pt to continue as previously given 09/15/2022: Access Code: [email or web address] URL: [email or web address] Date: 09/15/2022 Prepared by: Ricard Dillon  Exercises - Seated Gaze Stabilization with Head Rotation  - 1 x daily - 7 x weekly - 3 sets - 2 reps - 30 sec hold    PT Short Term Goals  target date 12/15/2022        PT SHORT TERM GOAL #1   Title Patient will be independent in home exercise program to improve strength/mobility for better functional independence with ADLs and for self-management.    Baseline 3/3 HEP compliant 3/31 hep compliant    Time 6    Period Weeks    Status Achieved    Target Date 02/20/21      PT SHORT TERM GOAL #2   Title Patient will be modified independent in walking on even/uneven surface with least restrictive assistive device, for 10+ minutes without rest break, reporting some difficulty  or less to improve walking tolerance with community ambulation including grocery shopping, going to church,etc.    Baseline 02/03: instability, LOB multiple times while ambulating to elevator 3/31: unable to test due to throwing up 6/20: 6 minutes 7/12: unable to test due to migraine 9/12: defer to next session 11/15: unable to test due to migraine 3/2: requires heavy use of AD and seated rest breaks 4/20: walked around yard  yesterday    Time 6    Period Weeks    Status Previously partially Met  - now discontinued as pt focusing on VRT   Target Date 03/19/22      PT SHORT TERM GOAL #3   Title Patient will increase ABC scale score >80% to demonstrate better functional mobility and better confidence with ADLs.    Baseline 01/09/21: 60.625% 3/3/: 50% 3/31: 63% 6/20: 45% 7/25: 40% 9/12: 51.1% 11/15: 48% 1/17: 40% 3/2: 33% 4/20: 39% 6/6: 51%    Time 6    Period Weeks    Status Partially Met    Target Date 06/23/22              PT Long Term Goals - target date 01/26/2023        PT LONG TERM GOAL #1   Title Patient will increase FOTO score to equal to or greater than 65 to demonstrate statistically significant improvement in mobility and quality of life.    Baseline scored 41/100 on 05/21/2020; 7/22 40, 8/23: 60/100, 09/30/20=40, 01/09/21: 46 3/3: 40.3% 3/31: 49% 6/20: 45% 7/12: 50.7% 9/12: 44.7% 11/15: 40% 1/17: 40% 3/2: 42%    Time 12    Period Weeks    Status Not Met    Target Date 10/20/22       PT LONG TERM GOAL #2   Title Patient will reduce falls risk as indicated by decreased TUG time to less than 11 seconds.    Baseline scored 37.9 sec with TUG on 05/21/2020; 21seconds with elevated rollator 7/22, 8/23: 13.62 sec with up and go walker, 01/09/21: 15.81s with hiking stick 3/3: 17.98 seconds one near LOB 3/31: 15.57 seconds with walking stick 6/20: 14.8 seconds with walking stick. 7/25: 19.72 seconds with walking stick. 9/26: 17.6 seconds 11/15: unable to test due to migraine 1/17:  deferred due to pain 3/2:19.25 seconds with walking stick in involved hand 4/20: deferred due to migraine and back pain 6/6: 10.5 seconds with walking stick    Time 12    Period Weeks    Status Achieved    Target Date 04/30/22      PT LONG TERM GOAL #3   Title Patient will increase right UE and LE gross strength to 4+/5 throughout to improve functional strength for independent gait, increased standing tolerance and increased ADL ability.    Baseline grossly +3/5 to -4/5 right UE and LE strength on 05/21/2020; grossly 4-/5 for RUE, grossly 4-/5 for RLE on 7/22, grossly 4-/5 RUE, grossly 4/5 RLE 3/3: see note 3/2: see note 4/20: RLE 4-/5 with quad 4/5 RUE 4/5 with bicep 3+/5 6/6: see note: 7/25: see note   Time 12    Period Weeks    Status Partially Met - now discontinued as pt focusing on VRT   Target Date 10/20/22     PT LONG TERM GOAL #4   Title Patient will increase 10 meter walk test to >1.27ms as to improve gait speed for better community ambulation and to reduce fall risk.    Baseline on 7/22 .57 m/sself selected, fast  .73 m/s with LOB pt reported R knee buckling, elevated rollator used, 8/23: 1.06 m/s with up and go walker, 01/09/21: 0.72 m/s with hiking stick 3/3: 0.74 m/s with walking stick 3/31: 0.81 m/s w walking stick 6/20: 1.1 m/s with walking stick    Time 12    Period Weeks    Status Achieved      PT LONG TERM GOAL #5  Title Patient will increase Berg Balance score by > 6 points to demonstrate decreased fall risk during functional activities.    Baseline 8/23: 37/56, 09/30/20=40/56, 12/20: 42/56 3/3: deferred 3/31 will perform next time; deferred due to throwing up 6/20: 46/56 7/12 38/56    Time 8    Period Weeks    Status Achieved      Additional Long Term Goals   Additional Long Term Goals Yes      PT LONG TERM GOAL #6   Title Patient (< 65 years old) will complete five times sit to stand test in < 10 seconds indicating an increased LE strength and improved balance.     Baseline 8/23: 22 sec, 09/30/20=13.04 sec, 12/20: 27.05 sec, 01/09/21: 18.01s 3/3: 23 seconds no UE support 3/31; deferred due to patient throwing up 7/12: 15.2 seconds 11/15: 26 seconds 1/17: deferred due to pain 3/2: 17.5 seconds 4/20: deferred due to migraine and back pain 6/6: 11 seconds with SUE support 8/22: 13.7 seconds with SUE support    Time 12    Period Weeks    Status Partially Met    Target Date 10/20/22     PT LONG TERM GOAL #7   Title Patient will improve 6 min walk test >1000 feet with LRAD for improved gait ability in community.    Baseline 8/23: not tested; 8/30 845, goal adjusted >2072f, 09/30/20=600 feet - stopped test due to R ankle catching making gait unsafe, 11/25/20= 330 feet - stopped test due to severe dizziness, vomiting/nausea, sweating making gait unsafe; 01/09/21: 720 ft with no rest breaks 3/3: deferred due to nausea 3/31: deferred due to patient throwing up 6/20: 640 ft 7/12: [id number] walking stick, no rest breaks; 9/26: 1000 ft    Time 12    Period Weeks    Status Achieved      PT LONG TERM GOAL #8   Title Patient will increase Berg Balance score by > 6 points  (52/56) to demonstrate decreased fall risk during functional activities.    Baseline 6/20: 46/56 7/12: 38/56 with migraine 9/12: defer to next session 11/15:  unable to test due to migraine 1/17: deferred due to pain 3/2: next session 4/20: deferred due to migraine and back pain    Time 12    Period Weeks    Status Partially Met    Target Date 10/20/22     PT LONG TERM GOAL  #9   TITLE Patient will report decreased frequency of falling per week to 1x/week for decreased fall risk and improved quality of life    Baseline 12/1: 4 falls minimum a week 1/17: 1 fall last week 3/2: has been bed bound the week prior 4/20: 2-3x/week 6/6: 2x/week 7/25: 2x/week; 11/28: average 3 falls/week   Time 12    Period Weeks    Status Partially Met    Target Date 08/04/22      PT LONG TERM GOAL  #10   TITLE  Patient will turn head 75% of the time without dizziness or LOB.    Baseline 6/6: patient falls and has extreme dizziness resulting in throwing up. 7/25: 25% of the time; 11/28: "it happens a lot 65% of the time"   Time 12    Period Weeks    Status Partially Met   Target Date 10/20/22     PT LONG TERM GOAL  #11   TITLE Patient will report a worst pain of 4/10 on VAS in cervical and lumbar  to  improve tolerance with ADLs and reduced symptoms with activities.    Baseline 6/6: lumbar: 10/10, cervical 8/10 7/25: lumbar 6-7/10; cervical 5-6/10    Time 12    Period Weeks    Status Partially Met   Target Date 10/20/22              Plan     Clinical Impression Statement Initiated goal retesting for recert (see above), however, most goals deferred as pt missed recent sessions due to migraines and benefited from instruction in habituation interventions and VOR. Pt exhibits increased tolerance for habituation and VOR interventions on this date, although he still requires frequent rest intervals and did experience symptom increase. Despite this, pt with steadier gate with ambulating to his car (PTs assisted) compared to prior sessions.  Pt would benefit from continued PT services to further address functional deficits and to try to decrease patient's falls risk and subjective symptoms of imbalance and dizziness. Patient's condition has the potential to improve in response to therapy. Maximum improvement is yet to be obtained. The anticipated improvement is attainable and reasonable in a generally predictable time.      Personal Factors and Comorbidities Comorbidity 3+;Time since onset of injury/illness/exacerbation    Comorbidities anxiety, back pain, COPD, headache, HTN, MI, sleep apnea, migraines    Examination-Activity Limitations Locomotion Level;Transfers;Bed Mobility;Stand;Stairs;Sleep;Squat;Bend    Examination-Participation Restrictions Driving;Medication Management    Stability/Clinical  Decision Making Unstable/Unpredictable    Rehab Potential Fair    PT Frequency 1x / week    PT Duration 12 weeks    PT Treatment/Interventions ADLs/Self Care Home Management;Canalith Repostioning;Moist Heat;Electrical Stimulation;Gait training;Stair training;Functional mobility training;Therapeutic activities;Therapeutic exercise;Balance training;Neuromuscular re-education;Patient/family education;Manual techniques;Passive range of motion;Vestibular    PT Next Visit Plan VBI and Cervical ROM screen, maneuvers as approrpiate   PT Home Exercise Plan Medbridge Access Code: [email or web address]    Consulted and Agree with Plan of Care Patient;Family member/caregiver    Family Member Consulted Wife               Ricard Dillon PT, DPT  11/03/2022, 5:33 PM

## 2022-12-02 ENCOUNTER — Ambulatory Visit: Payer: BC Managed Care – PPO

## 2022-12-08 ENCOUNTER — Ambulatory Visit: Payer: BC Managed Care – PPO

## 2022-12-09 ENCOUNTER — Ambulatory Visit: Payer: Medicare Other | Attending: Neurology

## 2022-12-09 DIAGNOSIS — M542 Cervicalgia: Secondary | ICD-10-CM | POA: Insufficient documentation

## 2022-12-09 DIAGNOSIS — R42 Dizziness and giddiness: Secondary | ICD-10-CM | POA: Diagnosis not present

## 2022-12-09 DIAGNOSIS — R2681 Unsteadiness on feet: Secondary | ICD-10-CM | POA: Diagnosis present

## 2022-12-09 NOTE — Therapy (Signed)
OUTPATIENT PHYSICAL THERAPY TREATMENT NOTE   Patient Name: Cory Espinoza MRN: [id number] DOB:[birth year], 52 y.o., male Today's Date: 12/09/2022  PCP: [place] Pink MD REFERRING PROVIDER: Jennings Books MD      PT End of Session - 12/09/22 1635     Visit Number 66    Number of Visits 110    Date for PT Re-Evaluation 01/26/23    Authorization Type [employer] PPO;    PT Start Time 0347    PT Stop Time 1630    PT Time Calculation (min) 46 min    Equipment Utilized During Treatment Gait belt    Activity Tolerance Other (comment);Patient tolerated treatment well   limited by intermittent nausea and dizziness   Behavior During Therapy [facility] for tasks assessed/performed              Past Medical History:  Diagnosis Date   Allergy    Anginal pain (HCC)    Anxiety    Arthritis    lumbar spine   Asthma    Atrial fibrillation (HCC)    Back pain    Severe Lumbar Pain   CHF (congestive heart failure) (HCC)    COPD (chronic obstructive pulmonary disease) (HCC)    Restrictive lung disease   Coronary artery disease    Dyspnea    Dysrhythmia    afib   GERD (gastroesophageal reflux disease)    Headache    [name], onset October 2020, cluster headaches in the past   History of kidney stones    Hyperlipidemia    Hypertension    Myocardial infarction ([place])    at age 86   Pneumonia    Restrictive lung disease    Sleep apnea    unable to use cpap since onset of migraines   Past Surgical History:  Procedure Laterality Date   ABDOMINAL EXPOSURE N/A 03/25/2020   Procedure: ABDOMINAL EXPOSURE;  Surgeon: Rosetta Posner, MD;  Location: [facility] OR;  Service: Vascular;  Laterality: N/A;   ANTERIOR CERVICAL DECOMP/DISCECTOMY FUSION N/A 01/23/2020   Procedure: ANTERIOR CERVICAL DECOMPRESSION FUSION - CERVICAL SIX-CERVICAL SEVEN;  Surgeon: Earnie Larsson, MD;  Location: [place];  Service: Neurosurgery;  Laterality: N/A;   ANTERIOR LUMBAR FUSION N/A 03/25/2020   Procedure: ANTERIOR LUMBAR  INTERBODY FUSION LUMBAR FIVE-SACRAL ONE.;  Surgeon: Earnie Larsson, MD;  Location: [place];  Service: Neurosurgery;  Laterality: N/A;  anterior   BACK SURGERY  1996   CARDIAC CATHETERIZATION Left 04/27/2016   Procedure: Left Heart Cath and Coronary Angiography;  Surgeon: Corey Skains, MD;  Location: [place] [facility];  Service: Cardiovascular;  Laterality: Left;   CARDIAC CATHETERIZATION N/A 04/27/2016   Procedure: Intravascular Pressure Wire/FFR Study;  Surgeon: Yolonda Kida, MD;  Location: [place] [facility];  Service: Cardiovascular;  Laterality: N/A;   CATHETERIZATION OF PULMONARY ARTERY WITH RETRIEVAL OF FOREIGN BODY Bilateral 04/07/2011   heart   DEGENERATIVE SPONDYLOLISTHESIS     KNEE ARTHROSCOPY Bilateral 2004   LUMBAR DISC SURGERY  1999   RADICULOPATHY, CERVICAL REGION     TONSILLECTOMY     Patient Active Problem List   Diagnosis Date Noted   [name] (coronary artery disease) 10/21/2021   Degenerative spondylolisthesis 03/25/2020   Physical deconditioning 03/04/2020   Lobar pneumonia, unspecified organism ([place]) 03/04/2020   Intractable hemiplegic migraine with status migrainosus 03/02/2020   Current tobacco use 02/05/2020   Abnormal findings on diagnostic imaging of lung 02/05/2020   Shortness of breath 02/05/2020   Herniated disc, cervical 01/23/2020  Cervical spondylosis with myelopathy and radiculopathy 01/23/2020   Pre-operative respiratory examination 01/12/2020   Weakness on right side of face 12/22/2019   Osteoarthritis of spine with radiculopathy, lumbar region 12/11/2019   Intractable migraine with aura with status migrainosus 11/09/2019   Former smoker 08/27/2018   Benign essential HTN 08/27/2018   Cough productive of purulent sputum 11/08/2017   GERD (gastroesophageal reflux disease) 05/06/2017   Contusion of hand 04/22/2017   BP (high blood pressure) 04/22/2017   Oxygen desaturation 04/22/2017   Prostate lump 04/22/2017   Unstable angina ([place]) 04/27/2016    Chest pain 04/24/2016   Morbid obesity ([place]) 01/29/2016   Elevated ALT measurement 10/30/2015   Nonspecific elevation of levels of transaminase and lactic acid dehydrogenase (ldh) 10/30/2015   Reduced libido 10/29/2015   Hyperlipidemia 08/06/2015   Anxiety 08/06/2015   Atherosclerosis of coronary artery 08/06/2015   Childhood asthma 08/06/2015   Chronic obstructive pulmonary disease ([place]) 08/06/2015   OSA (obstructive sleep apnea) 08/06/2015   Anxiety disorder 08/06/2015   Atherosclerosis of native coronary artery of native heart with stable angina pectoris ([place]) 03/54/6568   Uncomplicated asthma 12/75/1700    REFERRING DIAG: BPPV/unsteadiness  THERAPY DIAG:  Dizziness and giddiness  Unsteadiness on feet  Rationale for Evaluation and Treatment Rehabilitation  PERTINENT HISTORY: Pt has a complex medical history. 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 He states that he fell 10-12 feet and struck his head, neck, and back. He was able to finish the course which took him approximately 45 minutes more to complete. He does endorse a second fall while finishing the course but did not suffer a second head injury. He states that he started getting headaches that day which have persisted since that time. 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 He does report improvement in his headaches recently. Neurology was concerned about possible BPPV and have referred him for a vestibular evaluation. In addition since the injury, pt developed RUE and RLE pain and weakness. Pt states that NCV showed abnormal nerve conduction in RUE. He has since undergone a C7-7 ACDF on 01/23/20. He reports initial improvement in RUE strength, but states that he has a gradual return of his RUE weakness. He was also having significant RLE weakness and pain and underwent and L5-S1 anterior lumbar interbody fusion  on 03/25/20. Patient reports that he does not have any back precautions but needs to wear the low back brace for one more month. Patient reports he has a follow-up appointment with the surgeon next month. Patient reports he has a follow-up appointment with Dr. Brigitte Pulse, neurologist, in August. He arrives to therapy ambulating with an upright rollator walker. Pt denies any mention of concussion after his injury. He has been having cognitive issues since his injury and has previously had a heavy metals screen as well as neurocognitive testing. He has had an MRI of his brain with and without contrast on 12/18/19. Results were mildly motion degraded examination. No evidence of acute intracranial abnormality. Minimal chronic small vessel ischemic disease. He has tremor in both his hands. He has a positive family history of Parkinson's disease in his grandfather. He is complaining of constant dizziness and unsteadiness which are worse with activity. Patient states that he frequently loses his balance and his wife has to steady him.   Describe your dizziness without using the term "dizzy:" Pt states he  feels unsteady, spinning, nausea,swimmy-headed at times.  He reports spinning can last minutes or longer. Pt is taking meclizine, reports he usually takes prior to PT, including today. Pt has an inhaler with peppermint he uses for nausea. Pt has vomited before due to vertigo. Vertigo has been on and off since 2020. Pt spouse reports pt fell a couple days ago at night going to the bathroom. Pt hit his head. He states "too many falls in the last six months." Pt cannot always get up when falls to the floor. Pt spouse reports pt had recent seizure that lasted longer than normal (PTSD type seizures). Seizures(?) typically can last from 10 to 24 hours. Yawning frequently is a sign of oncoming seizure for pt. Pt has interrupted sleep. He has neck pain but has been using a massager for his neck and found this to help a bit. Some days  his neck pain ranges from 1-2/10, some days 7-8/10. Pt can experience double vision with migraines.Other symptoms include impaired sensation in R leg.  How long does your vertigo last (seconds, hours, >24 hours): Has experienced spinning that lasts over 24 hours  Any other symptoms associated with your dizziness? Unsure if dizzier with migraine symptoms   Do you have trouble with going into busy stores, using screens, or being a passenger in a car and seeing other cars go by?  -issues with being a passenger in a car and watching cars go by  -no issue with screens   -issue with busy stores   What increases likelihood of a migraine? Constantly has a migraine, pt reports the intensity can improve, he has come down to a 2/10 before, can be 5-10/10. Pt getting infusions currently, and thinks they're working. Strong smells can worsen migraine.   What tends to make it more likely you'll have a seizure? Flashing lights, stress and when pt feels angry reports he can have some trouble controlling emotions at times.  PRECAUTIONS: Falls  SUBJECTIVE: Pt returns to PT after long absence, reports was sick recently. Pt spouse present and reports pt had two seizures: one on December 3rd and one on December 8th. Pt took 24 hours to get back to his normal following each seizure. Pt reports 2 falls since last visit, one in the shower, states, "I bumped my head." Reports the shower chair came out from underneath him when he went to stand up.  Pt reports his dizziness "isn't really, really bad" but that he's had dizzy spells.He feels his balance is a bit better, and his spouse reports she thinks his balance is unchanged. Pt reports he's done some of his HEP but has not been consistent. He can perform HEP slowly without it increasing his symptoms but is still limited with increasing speed.  PAIN:  Are you having pain? Yes: NPRS scale: not reported/10 Pain location: head Pain description: migraine Aggravating factors:  turning head, weather Relieving factors: medicine, dark room  TODAY'S TREATMENT:      Instruction in advancing VORx1, seated at home by increasing speed, continuing to rest after about 10 head turns and stopping if dizziness >2-3/10. Pt and spouse verbalize understanding.  Seated EC horizontal head turns 5x, slowly. Pt rates dizziness reaches 4/10.   UT stretch 2x30 sec B  Seated EC vertical head turns 5x, slowly, dizziness reaches 5/10  Seated cervical extension stretch 60 sec  Seated VORx1, horizontal head turns with large target (red p.ball)  10x, slowly.. Dizziness maintains at 4/10 --large target VORx1, seated, horizontal head  turns with slight increased speed   UT stretch 1x30 sec B   Cervical ext 45 sec   Chin tucks with manual resistance 10x 3 seconds   Scapular squeezes 2x10 x 3 sec holds   Nustep lvls 1-2 x 6 minutes (seat 12), cuing for SPM range (pt performs 70s-100s), additional cuing for cool-down period. HR taken immediately following intervention and is in 90s. PT monitors pt throughout for response to intervention.  Further education provided on symptom modulation with vestibular interventions.  PT assisted by another PT ambulate with pt back to his car at end of session, providing CGA.   PATIENT EDUCATION: Education details: purpose of positional testing, reviewed VOR technique and home exercise program Person educated: Patient Education method: Explanation, Demonstration, and Verbal cues Education comprehension: verbalized understanding, returned demonstration, and verbal cues required   HOME EXERCISE PROGRAM: No updates on this date, pt to continue as previously given 09/15/2022: Access Code: [email or web address] URL: [email or web address] Date: 09/15/2022 Prepared by: Ricard Dillon  Exercises - Seated Gaze Stabilization with Head Rotation  - 1 x daily - 7 x weekly - 3 sets - 2 reps - 30 sec hold    PT Short Term Goals  target date  12/15/2022        PT SHORT TERM GOAL #1   Title Patient will be independent in home exercise program to improve strength/mobility for better functional independence with ADLs and for self-management.    Baseline 3/3 HEP compliant 3/31 hep compliant    Time 6    Period Weeks    Status Achieved    Target Date 02/20/21      PT SHORT TERM GOAL #2   Title Patient will be modified independent in walking on even/uneven surface with least restrictive assistive device, for 10+ minutes without rest break, reporting some difficulty or less to improve walking tolerance with community ambulation including grocery shopping, going to church,etc.    Baseline 02/03: instability, LOB multiple times while ambulating to elevator 3/31: unable to test due to throwing up 6/20: 6 minutes 7/12: unable to test due to migraine 9/12: defer to next session 11/15: unable to test due to migraine 3/2: requires heavy use of AD and seated rest breaks 4/20: walked around yard yesterday    Time 6    Period Weeks    Status Previously partially Met  - now discontinued as pt focusing on VRT   Target Date 03/19/22      PT SHORT TERM GOAL #3   Title Patient will increase ABC scale score >80% to demonstrate better functional mobility and better confidence with ADLs.    Baseline 01/09/21: 60.625% 3/3/: 50% 3/31: 63% 6/20: 45% 7/25: 40% 9/12: 51.1% 11/15: 48% 1/17: 40% 3/2: 33% 4/20: 39% 6/6: 51%    Time 6    Period Weeks    Status Partially Met    Target Date 06/23/22              PT Long Term Goals - target date 01/26/2023        PT LONG TERM GOAL #1   Title Patient will increase FOTO score to equal to or greater than 65 to demonstrate statistically significant improvement in mobility and quality of life.    Baseline scored 41/100 on 05/21/2020; 7/22 40, 8/23: 60/100, 09/30/20=40, 01/09/21: 46 3/3: 40.3% 3/31: 49% 6/20: 45% 7/12: 50.7% 9/12: 44.7% 11/15: 40% 1/17: 40% 3/2: 42%    Time 12    Period Weeks    Status Not  Met    Target Date 10/20/22       PT LONG TERM GOAL #2   Title Patient will reduce falls risk as indicated by decreased TUG time to less than 11 seconds.    Baseline scored 37.9 sec with TUG on 05/21/2020; 21seconds with elevated rollator 7/22, 8/23: 13.62 sec with up and go walker, 01/09/21: 15.81s with hiking stick 3/3: 17.98 seconds one near LOB 3/31: 15.57 seconds with walking stick 6/20: 14.8 seconds with walking stick. 7/25: 19.72 seconds with walking stick. 9/26: 17.6 seconds 11/15: unable to test due to migraine 1/17: deferred due to pain 3/2:19.25 seconds with walking stick in involved hand 4/20: deferred due to migraine and back pain 6/6: 10.5 seconds with walking stick    Time 12    Period Weeks    Status Achieved    Target Date 04/30/22      PT LONG TERM GOAL #3   Title Patient will increase right UE and LE gross strength to 4+/5 throughout to improve functional strength for independent gait, increased standing tolerance and increased ADL ability.    Baseline grossly +3/5 to -4/5 right UE and LE strength on 05/21/2020; grossly 4-/5 for RUE, grossly 4-/5 for RLE on 7/22, grossly 4-/5 RUE, grossly 4/5 RLE 3/3: see note 3/2: see note 4/20: RLE 4-/5 with quad 4/5 RUE 4/5 with bicep 3+/5 6/6: see note: 7/25: see note   Time 12    Period Weeks    Status Partially Met - now discontinued as pt focusing on VRT   Target Date 10/20/22     PT LONG TERM GOAL #4   Title Patient will increase 10 meter walk test to >1.13ms as to improve gait speed for better community ambulation and to reduce fall risk.    Baseline on 7/22 .57 m/sself selected, fast  .73 m/s with LOB pt reported R knee buckling, elevated rollator used, 8/23: 1.06 m/s with up and go walker, 01/09/21: 0.72 m/s with hiking stick 3/3: 0.74 m/s with walking stick 3/31: 0.81 m/s w walking stick 6/20: 1.1 m/s with walking stick    Time 12    Period Weeks    Status Achieved      PT LONG TERM GOAL #5   Title Patient will increase Berg  Balance score by > 6 points to demonstrate decreased fall risk during functional activities.    Baseline 8/23: 37/56, 09/30/20=40/56, 12/20: 42/56 3/3: deferred 3/31 will perform next time; deferred due to throwing up 6/20: 46/56 7/12 38/56    Time 8    Period Weeks    Status Achieved      Additional Long Term Goals   Additional Long Term Goals Yes      PT LONG TERM GOAL #6   Title Patient (< 665years old) will complete five times sit to stand test in < 10 seconds indicating an increased LE strength and improved balance.    Baseline 8/23: 22 sec, 09/30/20=13.04 sec, 12/20: 27.05 sec, 01/09/21: 18.01s 3/3: 23 seconds no UE support 3/31; deferred due to patient throwing up 7/12: 15.2 seconds 11/15: 26 seconds 1/17: deferred due to pain 3/2: 17.5 seconds 4/20: deferred due to migraine and back pain 6/6: 11 seconds with SUE support 8/22: 13.7 seconds with SUE support    Time 12    Period Weeks    Status Partially Met    Target Date 10/20/22     PT LONG TERM GOAL #7   Title Patient will improve 6 min  walk test >1000 feet with LRAD for improved gait ability in community.    Baseline 8/23: not tested; 8/30 845, goal adjusted >2058f, 09/30/20=600 feet - stopped test due to R ankle catching making gait unsafe, 11/25/20= 330 feet - stopped test due to severe dizziness, vomiting/nausea, sweating making gait unsafe; 01/09/21: 720 ft with no rest breaks 3/3: deferred due to nausea 3/31: deferred due to patient throwing up 6/20: 640 ft 7/12: [id number] walking stick, no rest breaks; 9/26: 1000 ft    Time 12    Period Weeks    Status Achieved      PT LONG TERM GOAL #8   Title Patient will increase Berg Balance score by > 6 points  (52/56) to demonstrate decreased fall risk during functional activities.    Baseline 6/20: 46/56 7/12: 38/56 with migraine 9/12: defer to next session 11/15:  unable to test due to migraine 1/17: deferred due to pain 3/2: next session 4/20: deferred due to migraine and back  pain    Time 12    Period Weeks    Status Partially Met    Target Date 10/20/22     PT LONG TERM GOAL  #9   TITLE Patient will report decreased frequency of falling per week to 1x/week for decreased fall risk and improved quality of life    Baseline 12/1: 4 falls minimum a week 1/17: 1 fall last week 3/2: has been bed bound the week prior 4/20: 2-3x/week 6/6: 2x/week 7/25: 2x/week; 11/28: average 3 falls/week; 12/09/22: reports 2 falls since last seen (11/03/2022)   Time 12    Period Weeks    Status MET    Target Date 08/04/22      PT LONG TERM GOAL  #10   TITLE Patient will turn head 75% of the time without dizziness or LOB.    Baseline 6/6: patient falls and has extreme dizziness resulting in throwing up. 7/25: 25% of the time; 11/28: "it happens a lot 65% of the time"; 12/09/22: head turns continue to increase pt's dizziness to 4-5/10   Time 12    Period Weeks    Status Partially Met   Target Date 10/20/22     PT LONG TERM GOAL  #11   TITLE Patient will report a worst pain of 4/10 on VAS in cervical and lumbar  to improve tolerance with ADLs and reduced symptoms with activities.    Baseline 6/6: lumbar: 10/10, cervical 8/10 7/25: lumbar 6-7/10; cervical 5-6/10    Time 12    Period Weeks    Status Partially Met   Target Date 10/20/22              Plan     Clinical Impression Statement Pt returns to PT following absence due to illness. Pt reports only 2 falls since last seen over a month ago, achieving goal of reduction in fall frequency. Pt also reports progress with VOR HEP reporting slow head turns no longer increase symptoms. PT instructed pt today in advancing seated VOR at home to performing 10 head turns with slight increase in speed until dizziness reaches 2/10, and to rest if dizziness reaches >2-3/10. Pt can still become highly symptomatic with VOR exercises so PT will continue to monitor this closely. Pt also initiated endurance training on nustep today without symptoms.  The pt will benefit from further skilled PT to address these deficits in order to decrease fall risk, movement-triggered dizziness and to improve QOL.    Personal Factors and  Comorbidities Comorbidity 3+;Time since onset of injury/illness/exacerbation    Comorbidities anxiety, back pain, COPD, headache, HTN, MI, sleep apnea, migraines    Examination-Activity Limitations Locomotion Level;Transfers;Bed Mobility;Stand;Stairs;Sleep;Squat;Bend    Examination-Participation Restrictions Driving;Medication Management    Stability/Clinical Decision Making Unstable/Unpredictable    Rehab Potential Fair    PT Frequency 1x / week    PT Duration 12 weeks    PT Treatment/Interventions ADLs/Self Care Home Management;Canalith Repostioning;Moist Heat;Electrical Stimulation;Gait training;Stair training;Functional mobility training;Therapeutic activities;Therapeutic exercise;Balance training;Neuromuscular re-education;Patient/family education;Manual techniques;Passive range of motion;Vestibular    PT Next Visit Plan VBI and Cervical ROM screen, maneuvers as approrpiate   PT Home Exercise Plan Medbridge Access Code: [email or web address]    Consulted and Agree with Plan of Care Patient;Family member/caregiver    Family Member Consulted Wife               Ricard Dillon PT, DPT  12/09/2022, 4:49 PM

## 2022-12-15 ENCOUNTER — Ambulatory Visit: Payer: BC Managed Care – PPO

## 2022-12-16 ENCOUNTER — Ambulatory Visit: Payer: Medicare Other

## 2022-12-16 NOTE — Therapy (Incomplete)
OUTPATIENT PHYSICAL THERAPY TREATMENT NOTE/Physical Therapy Progress Note   Dates of reporting period  ***   to   ***    Patient Name: Cory Espinoza MRN: [id number] DOB:1971-10-09, 52 y.o., male Today's Date: 12/16/2022  PCP: Jerl Mina MD REFERRING PROVIDER: Cristopher Peru MD         Past Medical History:  Diagnosis Date   Allergy    Anginal pain [facility])    Anxiety    Arthritis    lumbar spine   Asthma    Atrial fibrillation (HCC)    Back pain    Severe Lumbar Pain   CHF (congestive heart failure) (HCC)    COPD (chronic obstructive pulmonary disease) (HCC)    Restrictive lung disease   Coronary artery disease    Dyspnea    Dysrhythmia    afib   GERD (gastroesophageal reflux disease)    Headache    [name], onset October 2020, cluster headaches in the past   History of kidney stones    Hyperlipidemia    Hypertension    Myocardial infarction (HCC)    at age 13   Pneumonia    Restrictive lung disease    Sleep apnea    unable to use cpap since onset of migraines   Past Surgical History:  Procedure Laterality Date   ABDOMINAL EXPOSURE N/A 03/25/2020   Procedure: ABDOMINAL EXPOSURE;  Surgeon: Larina Earthly, MD;  Location: [facility] OR;  Service: Vascular;  Laterality: N/A;   ANTERIOR CERVICAL DECOMP/DISCECTOMY FUSION N/A 01/23/2020   Procedure: ANTERIOR CERVICAL DECOMPRESSION FUSION - CERVICAL SIX-CERVICAL SEVEN;  Surgeon: Julio Sicks, MD;  Location: [facility] OR;  Service: Neurosurgery;  Laterality: N/A;   ANTERIOR LUMBAR FUSION N/A 03/25/2020   Procedure: ANTERIOR LUMBAR INTERBODY FUSION LUMBAR FIVE-SACRAL ONE.;  Surgeon: Julio Sicks, MD;  Location: [facility] OR;  Service: Neurosurgery;  Laterality: N/A;  anterior   BACK SURGERY  1996   CARDIAC CATHETERIZATION Left 04/27/2016   Procedure: Left Heart Cath and Coronary Angiography;  Surgeon: Lamar Blinks, MD;  Location: [facility] INVASIVE CV LAB;  Service: Cardiovascular;  Laterality: Left;   CARDIAC CATHETERIZATION N/A  04/27/2016   Procedure: Intravascular Pressure Wire/FFR Study;  Surgeon: Alwyn Pea, MD;  Location: [facility] INVASIVE CV LAB;  Service: Cardiovascular;  Laterality: N/A;   CATHETERIZATION OF PULMONARY ARTERY WITH RETRIEVAL OF FOREIGN BODY Bilateral 04/07/2011   heart   DEGENERATIVE SPONDYLOLISTHESIS     KNEE ARTHROSCOPY Bilateral 2004   LUMBAR DISC SURGERY  1999   RADICULOPATHY, CERVICAL REGION     TONSILLECTOMY     Patient Active Problem List   Diagnosis Date Noted   [name] (coronary artery disease) 10/21/2021   Degenerative spondylolisthesis 03/25/2020   Physical deconditioning 03/04/2020   Lobar pneumonia, unspecified organism (HCC) 03/04/2020   Intractable hemiplegic migraine with status migrainosus 03/02/2020   Current tobacco use 02/05/2020   Abnormal findings on diagnostic imaging of lung 02/05/2020   Shortness of breath 02/05/2020   Herniated disc, cervical 01/23/2020   Cervical spondylosis with myelopathy and radiculopathy 01/23/2020   Pre-operative respiratory examination 01/12/2020   Weakness on right side of face 12/22/2019   Osteoarthritis of spine with radiculopathy, lumbar region 12/11/2019   Intractable migraine with aura with status migrainosus 11/09/2019   Former smoker 08/27/2018   Benign essential HTN 08/27/2018   Cough productive of purulent sputum 11/08/2017   GERD (gastroesophageal reflux disease) 05/06/2017   Contusion of hand 04/22/2017   BP (high blood pressure) 04/22/2017   Oxygen desaturation  04/22/2017   Prostate lump 04/22/2017   Unstable angina ([place]) 04/27/2016   Chest pain 04/24/2016   Morbid obesity ([place]) 01/29/2016   Elevated ALT measurement 10/30/2015   Nonspecific elevation of levels of transaminase and lactic acid dehydrogenase (ldh) 10/30/2015   Reduced libido 10/29/2015   Hyperlipidemia 08/06/2015   Anxiety 08/06/2015   Atherosclerosis of coronary artery 08/06/2015   Childhood asthma 08/06/2015   Chronic obstructive pulmonary disease  ([place]) 08/06/2015   OSA (obstructive sleep apnea) 08/06/2015   Anxiety disorder 08/06/2015   Atherosclerosis of native coronary artery of native heart with stable angina pectoris ([place]) 95/63/8756   Uncomplicated asthma 43/32/9518    REFERRING DIAG: BPPV/unsteadiness  THERAPY DIAG:  No diagnosis found.  Rationale for Evaluation and Treatment Rehabilitation  PERTINENT HISTORY: Pt has a complex medical history. 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 He states that he fell 10-12 feet and struck his head, neck, and back. He was able to finish the course which took him approximately 45 minutes more to complete. He does endorse a second fall while finishing the course but did not suffer a second head injury. He states that he started getting headaches that day which have persisted since that time. 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 He does report improvement in his headaches recently. Neurology was concerned about possible BPPV and have referred him for a vestibular evaluation. In addition since the injury, pt developed RUE and RLE pain and weakness. Pt states that NCV showed abnormal nerve conduction in RUE. He has since undergone a C7-7 ACDF on 01/23/20. He reports initial improvement in RUE strength, but states that he has a gradual return of his RUE weakness. He was also having significant RLE weakness and pain and underwent and L5-S1 anterior lumbar interbody fusion on 03/25/20. Patient reports that he does not have any back precautions but needs to wear the low back brace for one more month. Patient reports he has a follow-up appointment with the surgeon next month. Patient reports he has a follow-up appointment with Dr. Brigitte Pulse, neurologist, in August. He arrives to therapy ambulating with an upright rollator walker. Pt denies any mention of concussion after his injury. He has been  having cognitive issues since his injury and has previously had a heavy metals screen as well as neurocognitive testing. He has had an MRI of his brain with and without contrast on 12/18/19. Results were mildly motion degraded examination. No evidence of acute intracranial abnormality. Minimal chronic small vessel ischemic disease. He has tremor in both his hands. He has a positive family history of Parkinson's disease in his grandfather. He is complaining of constant dizziness and unsteadiness which are worse with activity. Patient states that he frequently loses his balance and his wife has to steady him.   Describe your dizziness without using the term "dizzy:" Pt states he feels unsteady, spinning, nausea,swimmy-headed at times.  He reports spinning can last minutes or longer. Pt is taking meclizine, reports he usually takes prior to PT, including today. Pt has an inhaler with peppermint he uses for nausea. Pt has vomited before due to vertigo. Vertigo has been on and off since 2020. Pt spouse reports pt fell a couple days ago at night going to the bathroom. Pt hit his head. He states "too many falls in the last six months." Pt cannot always get up when falls  to the floor. Pt spouse reports pt had recent seizure that lasted longer than normal (PTSD type seizures). Seizures(?) typically can last from 10 to 24 hours. Yawning frequently is a sign of oncoming seizure for pt. Pt has interrupted sleep. He has neck pain but has been using a massager for his neck and found this to help a bit. Some days his neck pain ranges from 1-2/10, some days 7-8/10. Pt can experience double vision with migraines.Other symptoms include impaired sensation in R leg.  How long does your vertigo last (seconds, hours, >24 hours): Has experienced spinning that lasts over 24 hours  Any other symptoms associated with your dizziness? Unsure if dizzier with migraine symptoms   Do you have trouble with going into busy stores, using  screens, or being a passenger in a car and seeing other cars go by?  -issues with being a passenger in a car and watching cars go by  -no issue with screens   -issue with busy stores   What increases likelihood of a migraine? Constantly has a migraine, pt reports the intensity can improve, he has come down to a 2/10 before, can be 5-10/10. Pt getting infusions currently, and thinks they're working. Strong smells can worsen migraine.   What tends to make it more likely you'll have a seizure? Flashing lights, stress and when pt feels angry reports he can have some trouble controlling emotions at times.  PRECAUTIONS: Falls  SUBJECTIVE: Pt returns to PT after long absence, reports was sick recently. Pt spouse present and reports pt had two seizures: one on December 3rd and one on December 8th. Pt took 24 hours to get back to his normal following each seizure. Pt reports 2 falls since last visit, one in the shower, states, "I bumped my head." Reports the shower chair came out from underneath him when he went to stand up.  Pt reports his dizziness "isn't really, really bad" but that he's had dizzy spells.He feels his balance is a bit better, and his spouse reports she thinks his balance is unchanged. Pt reports he's done some of his HEP but has not been consistent. He can perform HEP slowly without it increasing his symptoms but is still limited with increasing speed.  PAIN:  Are you having pain? Yes: NPRS scale: not reported/10 Pain location: head Pain description: migraine Aggravating factors: turning head, weather Relieving factors: medicine, dark room  TODAY'S TREATMENT:    Goal reassessment completed for progress note.  Start with manual and stretching, then VOR and VRT interventions seated, then follow with manual  Instruction in advancing VORx1, seated at home by increasing speed, continuing to rest after about 10 head turns and stopping if dizziness >2-3/10. Pt and spouse verbalize  understanding.  Seated EC horizontal head turns 5x, slowly. Pt rates dizziness reaches 4/10.   UT stretch 2x30 sec B  Seated EC vertical head turns 5x, slowly, dizziness reaches 5/10  Seated cervical extension stretch 60 sec  Seated VORx1, horizontal head turns with large target (red p.ball)  10x, slowly.. Dizziness maintains at 4/10 --large target VORx1, seated, horizontal head turns with slight increased speed   UT stretch 1x30 sec B   Cervical ext 45 sec   Chin tucks with manual resistance 10x 3 seconds   Scapular squeezes 2x10 x 3 sec holds   Nustep lvls 1-2 x 6 minutes (seat 12), cuing for SPM range (pt performs 70s-100s), additional cuing for cool-down period. HR taken immediately following intervention and is in 90s.  PT monitors pt throughout for response to intervention.  Further education provided on symptom modulation with vestibular interventions.  PT assisted by another PT ambulate with pt back to his car at end of session, providing CGA.   PATIENT EDUCATION: Education details: purpose of positional testing, reviewed VOR technique and home exercise program Person educated: Patient Education method: Explanation, Demonstration, and Verbal cues Education comprehension: verbalized understanding, returned demonstration, and verbal cues required   HOME EXERCISE PROGRAM: No updates on this date, pt to continue as previously given 09/15/2022: Access Code: [email or web address] URL: [email or web address] Date: 09/15/2022 Prepared by: Temple Pacini  Exercises - Seated Gaze Stabilization with Head Rotation  - 1 x daily - 7 x weekly - 3 sets - 2 reps - 30 sec hold    PT Short Term Goals  target date 12/15/2022        PT SHORT TERM GOAL #1   Title Patient will be independent in home exercise program to improve strength/mobility for better functional independence with ADLs and for self-management.    Baseline 3/3 HEP compliant 3/31 hep compliant    Time 6    Period  Weeks    Status Achieved    Target Date 02/20/21      PT SHORT TERM GOAL #2   Title Patient will be modified independent in walking on even/uneven surface with least restrictive assistive device, for 10+ minutes without rest break, reporting some difficulty or less to improve walking tolerance with community ambulation including grocery shopping, going to church,etc.    Baseline 02/03: instability, LOB multiple times while ambulating to elevator 3/31: unable to test due to throwing up 6/20: 6 minutes 7/12: unable to test due to migraine 9/12: defer to next session 11/15: unable to test due to migraine 3/2: requires heavy use of AD and seated rest breaks 4/20: walked around yard yesterday    Time 6    Period Weeks    Status Previously partially Met  - now discontinued as pt focusing on VRT   Target Date 03/19/22      PT SHORT TERM GOAL #3   Title Patient will increase ABC scale score >80% to demonstrate better functional mobility and better confidence with ADLs.    Baseline 01/09/21: 60.625% 3/3/: 50% 3/31: 63% 6/20: 45% 7/25: 40% 9/12: 51.1% 11/15: 48% 1/17: 40% 3/2: 33% 4/20: 39% 6/6: 51%    Time 6    Period Weeks    Status Partially Met    Target Date 06/23/22              PT Long Term Goals - target date 01/26/2023        PT LONG TERM GOAL #1   Title Patient will increase FOTO score to equal to or greater than 65 to demonstrate statistically significant improvement in mobility and quality of life.    Baseline scored 41/100 on 05/21/2020; 7/22 40, 8/23: 60/100, 09/30/20=40, 01/09/21: 46 3/3: 40.3% 3/31: 49% 6/20: 45% 7/12: 50.7% 9/12: 44.7% 11/15: 40% 1/17: 40% 3/2: 42%    Time 12    Period Weeks    Status Not Met    Target Date 10/20/22       PT LONG TERM GOAL #2   Title Patient will reduce falls risk as indicated by decreased TUG time to less than 11 seconds.    Baseline scored 37.9 sec with TUG on 05/21/2020; 21seconds with elevated rollator 7/22, 8/23: 13.62 sec with up  and go walker, 01/09/21: 15.81s with hiking stick 3/3:  17.98 seconds one near LOB 3/31: 15.57 seconds with walking stick 6/20: 14.8 seconds with walking stick. 7/25: 19.72 seconds with walking stick. 9/26: 17.6 seconds 11/15: unable to test due to migraine 1/17: deferred due to pain 3/2:19.25 seconds with walking stick in involved hand 4/20: deferred due to migraine and back pain 6/6: 10.5 seconds with walking stick    Time 12    Period Weeks    Status Achieved    Target Date 04/30/22      PT LONG TERM GOAL #3   Title Patient will increase right UE and LE gross strength to 4+/5 throughout to improve functional strength for independent gait, increased standing tolerance and increased ADL ability.    Baseline grossly +3/5 to -4/5 right UE and LE strength on 05/21/2020; grossly 4-/5 for RUE, grossly 4-/5 for RLE on 7/22, grossly 4-/5 RUE, grossly 4/5 RLE 3/3: see note 3/2: see note 4/20: RLE 4-/5 with quad 4/5 RUE 4/5 with bicep 3+/5 6/6: see note: 7/25: see note   Time 12    Period Weeks    Status Partially Met - now discontinued as pt focusing on VRT   Target Date 10/20/22     PT LONG TERM GOAL #4   Title Patient will increase 10 meter walk test to >1.77m/s as to improve gait speed for better community ambulation and to reduce fall risk.    Baseline on 7/22 .57 m/sself selected, fast  .73 m/s with LOB pt reported R knee buckling, elevated rollator used, 8/23: 1.06 m/s with up and go walker, 01/09/21: 0.72 m/s with hiking stick 3/3: 0.74 m/s with walking stick 3/31: 0.81 m/s w walking stick 6/20: 1.1 m/s with walking stick    Time 12    Period Weeks    Status Achieved      PT LONG TERM GOAL #5   Title Patient will increase Berg Balance score by > 6 points to demonstrate decreased fall risk during functional activities.    Baseline 8/23: 37/56, 09/30/20=40/56, 12/20: 42/56 3/3: deferred 3/31 will perform next time; deferred due to throwing up 6/20: 46/56 7/12 38/56    Time 8    Period Weeks     Status Achieved      Additional Long Term Goals   Additional Long Term Goals Yes      PT LONG TERM GOAL #6   Title Patient (< 26 years old) will complete five times sit to stand test in < 10 seconds indicating an increased LE strength and improved balance.    Baseline 8/23: 22 sec, 09/30/20=13.04 sec, 12/20: 27.05 sec, 01/09/21: 18.01s 3/3: 23 seconds no UE support 3/31; deferred due to patient throwing up 7/12: 15.2 seconds 11/15: 26 seconds 1/17: deferred due to pain 3/2: 17.5 seconds 4/20: deferred due to migraine and back pain 6/6: 11 seconds with SUE support 8/22: 13.7 seconds with SUE support    Time 12    Period Weeks    Status Partially Met    Target Date 10/20/22     PT LONG TERM GOAL #7   Title Patient will improve 6 min walk test >1000 feet with LRAD for improved gait ability in community.    Baseline 8/23: not tested; 8/30 845, goal adjusted >2030ft, 09/30/20=600 feet - stopped test due to R ankle catching making gait unsafe, 11/25/20= 330 feet - stopped test due to severe dizziness, vomiting/nausea, sweating making gait unsafe; 01/09/21: 720 ft with no rest breaks 3/3: deferred due to nausea 3/31: deferred due  to patient throwing up 6/20: 640 ft 7/12: 624ft with walking stick, no rest breaks; 9/26: 1000 ft    Time 12    Period Weeks    Status Achieved      PT LONG TERM GOAL #8   Title Patient will increase Berg Balance score by > 6 points  (52/56) to demonstrate decreased fall risk during functional activities.    Baseline 6/20: 46/56 7/12: 38/56 with migraine 9/12: defer to next session 11/15:  unable to test due to migraine 1/17: deferred due to pain 3/2: next session 4/20: deferred due to migraine and back pain    Time 12    Period Weeks    Status Partially Met    Target Date 10/20/22     PT LONG TERM GOAL  #9   TITLE Patient will report decreased frequency of falling per week to 1x/week for decreased fall risk and improved quality of life    Baseline 12/1: 4 falls  minimum a week 1/17: 1 fall last week 3/2: has been bed bound the week prior 4/20: 2-3x/week 6/6: 2x/week 7/25: 2x/week; 11/28: average 3 falls/week; 12/09/22: reports 2 falls since last seen (11/03/2022)   Time 12    Period Weeks    Status MET    Target Date 08/04/22      PT LONG TERM GOAL  #10   TITLE Patient will turn head 75% of the time without dizziness or LOB.    Baseline 6/6: patient falls and has extreme dizziness resulting in throwing up. 7/25: 25% of the time; 11/28: "it happens a lot 65% of the time"; 12/09/22: head turns continue to increase pt's dizziness to 4-5/10   Time 12    Period Weeks    Status Partially Met   Target Date 10/20/22     PT LONG TERM GOAL  #11   TITLE Patient will report a worst pain of 4/10 on VAS in cervical and lumbar  to improve tolerance with ADLs and reduced symptoms with activities.    Baseline 6/6: lumbar: 10/10, cervical 8/10 7/25: lumbar 6-7/10; cervical 5-6/10    Time 12    Period Weeks    Status Partially Met   Target Date 10/20/22              Plan     Clinical Impression Statement Pt returns to PT following absence due to illness. Pt reports only 2 falls since last seen over a month ago, achieving goal of reduction in fall frequency. Pt also reports progress with VOR HEP reporting slow head turns no longer increase symptoms. PT instructed pt today in advancing seated VOR at home to performing 10 head turns with slight increase in speed until dizziness reaches 2/10, and to rest if dizziness reaches >2-3/10. Pt can still become highly symptomatic with VOR exercises so PT will continue to monitor this closely. Pt also initiated endurance training on nustep today without symptoms. The pt will benefit from further skilled PT to address these deficits in order to decrease fall risk, movement-triggered dizziness and to improve QOL.    Personal Factors and Comorbidities Comorbidity 3+;Time since onset of injury/illness/exacerbation    Comorbidities  anxiety, back pain, COPD, headache, HTN, MI, sleep apnea, migraines    Examination-Activity Limitations Locomotion Level;Transfers;Bed Mobility;Stand;Stairs;Sleep;Squat;Bend    Examination-Participation Restrictions Driving;Medication Management    Stability/Clinical Decision Making Unstable/Unpredictable    Rehab Potential Fair    PT Frequency 1x / week    PT Duration 12 weeks  PT Treatment/Interventions ADLs/Self Care Home Management;Canalith Repostioning;Moist Heat;Electrical Stimulation;Gait training;Stair training;Functional mobility training;Therapeutic activities;Therapeutic exercise;Balance training;Neuromuscular re-education;Patient/family education;Manual techniques;Passive range of motion;Vestibular    PT Next Visit Plan VOR, nustep, manual   PT Home Exercise Plan Medbridge Access Code: [email or web address]    Consulted and Agree with Plan of Care Patient;Family member/caregiver    Family Member Consulted Wife               Temple Pacini PT, DPT  12/16/2022, 9:19 AM

## 2022-12-22 ENCOUNTER — Ambulatory Visit: Payer: BC Managed Care – PPO

## 2022-12-23 ENCOUNTER — Ambulatory Visit: Payer: Medicare Other

## 2022-12-23 ENCOUNTER — Other Ambulatory Visit: Payer: Self-pay | Admitting: Student

## 2022-12-23 ENCOUNTER — Ambulatory Visit (HOSPITAL_COMMUNITY)
Admission: RE | Admit: 2022-12-23 | Discharge: 2022-12-23 | Disposition: A | Discharge status: Home / Self Care | Payer: 59 | Source: Ambulatory Visit | Attending: Student | Admitting: Student

## 2022-12-23 DIAGNOSIS — R569 Unspecified convulsions: Secondary | ICD-10-CM

## 2022-12-23 MED ORDER — GADOBUTROL 1 MMOL/ML IV SOLN
10.0000 mL | Freq: Once | INTRAVENOUS | Status: AC | PRN
  Administered 2022-12-23: 10 mL via INTRAVENOUS

## 2022-12-29 ENCOUNTER — Ambulatory Visit: Payer: BC Managed Care – PPO

## 2022-12-30 ENCOUNTER — Ambulatory Visit: Payer: Medicare Other

## 2022-12-30 ENCOUNTER — Telehealth: Payer: Self-pay

## 2022-12-30 NOTE — Telephone Encounter (Signed)
PT returns pt and his spouse's call to clinic asking if he should attend PT today as pt currently experiencing heightened symptoms related to his neuropathy, bells palsy, and increased nausea with his hemiplegic migraine symptoms. PT advises that pt should not attend vestibular PT today as this would likely exacerbate current symptoms even more (pt is very high fall risk, hx of seizures). Pt spouse confirms understanding, plan to cancel today's appointment and return when pt symptoms have decreased. PT also encouraged follow-up with neuro. Pt spouse reports they did recently and pt found to have nothing concerning on imaging per MRI report 12/23/2022.  Ricard Dillon PT, DPT

## 2023-01-05 ENCOUNTER — Ambulatory Visit: Payer: BC Managed Care – PPO

## 2023-01-06 ENCOUNTER — Ambulatory Visit: Payer: Medicare Other

## 2023-01-06 DIAGNOSIS — R42 Dizziness and giddiness: Secondary | ICD-10-CM | POA: Diagnosis not present

## 2023-01-06 DIAGNOSIS — M542 Cervicalgia: Secondary | ICD-10-CM

## 2023-01-06 DIAGNOSIS — R2681 Unsteadiness on feet: Secondary | ICD-10-CM

## 2023-01-06 NOTE — Therapy (Signed)
OUTPATIENT PHYSICAL THERAPY TREATMENT NOTE/Physical Therapy Progress Note   Dates of reporting period  06/30/2022   to   01/06/2023    Patient Name: Cory Espinoza MRN: [id number] DOB:13-Feb-1971, 52 y.o., male Today's Date: 01/06/2023  PCP: [place] Pink MD REFERRING PROVIDER: Jennings Books MD      PT End of Session - 01/06/23 1659     Visit Number 100    Number of Visits 110    Date for PT Re-Evaluation 01/26/23    Authorization Type [employer] PPO;    PT Start Time 1602    PT Stop Time 1648    PT Time Calculation (min) 46 min    Equipment Utilized During Treatment Gait belt    Activity Tolerance Other (comment);Patient tolerated treatment well   limited by dizziness   Behavior During Therapy [facility] for tasks assessed/performed               Past Medical History:  Diagnosis Date   Allergy    Anginal pain (HCC)    Anxiety    Arthritis    lumbar spine   Asthma    Atrial fibrillation (HCC)    Back pain    Severe Lumbar Pain   CHF (congestive heart failure) (HCC)    COPD (chronic obstructive pulmonary disease) (HCC)    Restrictive lung disease   Coronary artery disease    Dyspnea    Dysrhythmia    afib   GERD (gastroesophageal reflux disease)    Headache    [name], onset October 2020, cluster headaches in the past   History of kidney stones    Hyperlipidemia    Hypertension    Myocardial infarction ([place])    at age 73   Pneumonia    Restrictive lung disease    Sleep apnea    unable to use cpap since onset of migraines   Past Surgical History:  Procedure Laterality Date   ABDOMINAL EXPOSURE N/A 03/25/2020   Procedure: ABDOMINAL EXPOSURE;  Surgeon: Rosetta Posner, MD;  Location: [facility] OR;  Service: Vascular;  Laterality: N/A;   ANTERIOR CERVICAL DECOMP/DISCECTOMY FUSION N/A 01/23/2020   Procedure: ANTERIOR CERVICAL DECOMPRESSION FUSION - CERVICAL SIX-CERVICAL SEVEN;  Surgeon: Earnie Larsson, MD;  Location: [place];  Service: Neurosurgery;  Laterality: N/A;    ANTERIOR LUMBAR FUSION N/A 03/25/2020   Procedure: ANTERIOR LUMBAR INTERBODY FUSION LUMBAR FIVE-SACRAL ONE.;  Surgeon: Earnie Larsson, MD;  Location: [place];  Service: Neurosurgery;  Laterality: N/A;  anterior   BACK SURGERY  1996   CARDIAC CATHETERIZATION Left 04/27/2016   Procedure: Left Heart Cath and Coronary Angiography;  Surgeon: Corey Skains, MD;  Location: [place] [facility];  Service: Cardiovascular;  Laterality: Left;   CARDIAC CATHETERIZATION N/A 04/27/2016   Procedure: Intravascular Pressure Wire/FFR Study;  Surgeon: Yolonda Kida, MD;  Location: [place] [facility];  Service: Cardiovascular;  Laterality: N/A;   CATHETERIZATION OF PULMONARY ARTERY WITH RETRIEVAL OF FOREIGN BODY Bilateral 04/07/2011   heart   DEGENERATIVE SPONDYLOLISTHESIS     KNEE ARTHROSCOPY Bilateral 2004   LUMBAR DISC SURGERY  1999   RADICULOPATHY, CERVICAL REGION     TONSILLECTOMY     Patient Active Problem List   Diagnosis Date Noted   [name] (coronary artery disease) 10/21/2021   Degenerative spondylolisthesis 03/25/2020   Physical deconditioning 03/04/2020   Lobar pneumonia, unspecified organism ([place]) 03/04/2020   Intractable hemiplegic migraine with status migrainosus 03/02/2020   Current tobacco use 02/05/2020   Abnormal findings on diagnostic  imaging of lung 02/05/2020   Shortness of breath 02/05/2020   Herniated disc, cervical 01/23/2020   Cervical spondylosis with myelopathy and radiculopathy 01/23/2020   Pre-operative respiratory examination 01/12/2020   Weakness on right side of face 12/22/2019   Osteoarthritis of spine with radiculopathy, lumbar region 12/11/2019   Intractable migraine with aura with status migrainosus 11/09/2019   Former smoker 08/27/2018   Benign essential HTN 08/27/2018   Cough productive of purulent sputum 11/08/2017   GERD (gastroesophageal reflux disease) 05/06/2017   Contusion of hand 04/22/2017   BP (high blood pressure) 04/22/2017   Oxygen desaturation  04/22/2017   Prostate lump 04/22/2017   Unstable angina (HCC) 04/27/2016   Chest pain 04/24/2016   Morbid obesity (HCC) 01/29/2016   Elevated ALT measurement 10/30/2015   Nonspecific elevation of levels of transaminase and lactic acid dehydrogenase (ldh) 10/30/2015   Reduced libido 10/29/2015   Hyperlipidemia 08/06/2015   Anxiety 08/06/2015   Atherosclerosis of coronary artery 08/06/2015   Childhood asthma 08/06/2015   Chronic obstructive pulmonary disease (HCC) 08/06/2015   OSA (obstructive sleep apnea) 08/06/2015   Anxiety disorder 08/06/2015   Atherosclerosis of native coronary artery of native heart with stable angina pectoris (HCC) 08/06/2015   Uncomplicated asthma 08/06/2015    REFERRING DIAG: BPPV/unsteadiness  THERAPY DIAG:  Dizziness and giddiness  Unsteadiness on feet  Cervicalgia  SUBJECTIVE: Pt returns after absence from PT due to not feeling well/symptom exacerbation where pt had increased n/t and described red/pink eyes. He has since followed up with his physician. He is feeling better today, reports it is a good day regarding his symptoms. Pt also had another infusion recently. He reports this is helping and that he feels he can function and that it helps his migraines, although it does not make them go away.  With the infusion his migraine symptoms have been reduced to a 5-6/10 today. Pt does report he fell in the shower this past weekend when he closed his eyes to wash soap off his face. He has a grab bar and shower chair but was not using either item at the time. His back and neck are still somewhat sore following this fall (the pain has improved). Pt reports he fell last night when he took his dog out. He fell onto his bottom did not hit head, does not feel he got injured. PAIN:  Are you having pain? Yes: NPRS scale: not reported/10 Pain location: head, neck/back Pain description: pain following fall Aggravating factors: turning head, weather Relieving factors:  medicine, dark room  Rationale for Evaluation and Treatment Rehabilitation  PERTINENT HISTORY: Pt has a complex medical history. 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 He states that he fell 10-12 feet and struck his head, neck, and back. He was able to finish the course which took him approximately 45 minutes more to complete. He does endorse a second fall while finishing the course but did not suffer a second head injury. He states that he started getting headaches that day which have persisted since that time. 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 He does report improvement in his headaches recently. Neurology was concerned about possible BPPV and have referred him for a vestibular evaluation. In addition since the injury, pt developed RUE and RLE pain and weakness. Pt states that NCV showed abnormal nerve conduction in RUE. He has since undergone a  C7-7 ACDF on 01/23/20. He reports initial improvement in RUE strength, but states that he has a gradual return of his RUE weakness. He was also having significant RLE weakness and pain and underwent and L5-S1 anterior lumbar interbody fusion on 03/25/20. Patient reports that he does not have any back precautions but needs to wear the low back brace for one more month. Patient reports he has a follow-up appointment with the surgeon next month. Patient reports he has a follow-up appointment with Dr. Brigitte Pulse, neurologist, in August. He arrives to therapy ambulating with an upright rollator walker. Pt denies any mention of concussion after his injury. He has been having cognitive issues since his injury and has previously had a heavy metals screen as well as neurocognitive testing. He has had an MRI of his brain with and without contrast on 12/18/19. Results were mildly motion degraded examination. No evidence of acute intracranial abnormality. Minimal  chronic small vessel ischemic disease. He has tremor in both his hands. He has a positive family history of Parkinson's disease in his grandfather. He is complaining of constant dizziness and unsteadiness which are worse with activity. Patient states that he frequently loses his balance and his wife has to steady him.   Describe your dizziness without using the term "dizzy:" Pt states he feels unsteady, spinning, nausea,swimmy-headed at times.  He reports spinning can last minutes or longer. Pt is taking meclizine, reports he usually takes prior to PT, including today. Pt has an inhaler with peppermint he uses for nausea. Pt has vomited before due to vertigo. Vertigo has been on and off since 2020. Pt spouse reports pt fell a couple days ago at night going to the bathroom. Pt hit his head. He states "too many falls in the last six months." Pt cannot always get up when falls to the floor. Pt spouse reports pt had recent seizure that lasted longer than normal (PTSD type seizures). Seizures(?) typically can last from 10 to 24 hours. Yawning frequently is a sign of oncoming seizure for pt. Pt has interrupted sleep. He has neck pain but has been using a massager for his neck and found this to help a bit. Some days his neck pain ranges from 1-2/10, some days 7-8/10. Pt can experience double vision with migraines.Other symptoms include impaired sensation in R leg.  How long does your vertigo last (seconds, hours, >24 hours): Has experienced spinning that lasts over 24 hours  Any other symptoms associated with your dizziness? Unsure if dizzier with migraine symptoms   Do you have trouble with going into busy stores, using screens, or being a passenger in a car and seeing other cars go by?  -issues with being a passenger in a car and watching cars go by  -no issue with screens   -issue with busy stores   What increases likelihood of a migraine? Constantly has a migraine, pt reports the intensity can improve, he  has come down to a 2/10 before, can be 5-10/10. Pt getting infusions currently, and thinks they're working. Strong smells can worsen migraine.   What tends to make it more likely you'll have a seizure? Flashing lights, stress and when pt feels angry reports he can have some trouble controlling emotions at times.  PRECAUTIONS: Falls    TODAY'S TREATMENT:    NMR: Goal reassessment completed for progress note - please refer to goal section below for full details.  How often are you falling? Pt reports on average 2x/week, but this has improved and  reports some weeks he does not fall.  How often do you feel dizzy when turning your head? He reports this can vary. Currently able to turn his head 10x with VOR intervention before feeling dizzy. However, some days this can make him symptomatic quickly. He can now turn his head "to see if something is coming" stating, "I haven't been able to do that in a while."  STS: 12 seconds hands-free   ABC scale: 46.88%  Driving Optokinetic training video [email or web address]/[email or web address]: pt able to complete 1 min 9 seconds before needing to stop due to symptom increase. Dizziness reaches 2/10, described as swimmy-headed sensation. Added video to HEP to perform 1x/daily (do not perform if having a high symptom day, however) until symptoms reach 2/10, then rest.   Seated EC 2x30 sec. Pt exhibits right lean, cuing for upright posture/build awareness of posture. Mild lightheadedness/symptom increase with intervention. CGA provided  Manual therapy: Pt seated, instrument assisted STM provided to B UT, and STM to throughout cervical paraspinals bilat. Cued pt to perform sustained cervical rotation stretch bilaterally while STM provided. Attempted STM to occipital triangle, however pt found to be too TTP bilaterally. Will continue to monitor. Pt reports manual therapy feels good.   NMR PT x 2 ambulated with pt back to pt's car (pt using SPC), PTs  providing CGA. Pt without decrease in postural stability while ambulating.    PATIENT EDUCATION: Education details: goal retesting, indications, update to HEP Person educated: Patient Education method: Explanation, Demonstration, and Verbal cues Education comprehension: verbalized understanding, returned demonstration, and verbal cues required   HOME EXERCISE PROGRAM:   01/06/23:  Driving Optokinetic training video [email or web address]/[email or web address]. Added video to HEP to perform 1x/daily (do not perform if having a high symptom day, however) until symptoms reach 2/10, then rest. Watch video to tolerance. Do not perform this back to back with VOR intervention, allow for sufficient rest until symptoms have decreased back to 0/10.  09/15/2022: Access Code: [email or web address] URL: [email or web address] Date: 09/15/2022 Prepared by: Temple PaciniHaley Thadd Apuzzo  Exercises - Seated Gaze Stabilization with Head Rotation  - 1 x daily - 7 x weekly - 3 sets - 2 reps - 30 sec hold    PT Short Term Goals  target date 02/17/2023          PT SHORT TERM GOAL #1   Title Patient will be independent in home exercise program to improve strength/mobility for better functional independence with ADLs and for self-management.    Baseline 3/3 HEP compliant 3/31 hep compliant    Time 6    Period Weeks    Status Achieved    Target Date 02/20/21      PT SHORT TERM GOAL #2   Title Patient will be modified independent in walking on even/uneven surface with least restrictive assistive device, for 10+ minutes without rest break, reporting some difficulty or less to improve walking tolerance with community ambulation including grocery shopping, going to church,etc.    Baseline 02/03: instability, LOB multiple times while ambulating to elevator 3/31: unable to test due to throwing up 6/20: 6 minutes 7/12: unable to test due to migraine 9/12: defer to next session 11/15: unable to test due to migraine 3/2:  requires heavy use of AD and seated rest breaks 4/20: walked around yard yesterday    Time 6    Period Weeks    Status Previously partially Met  - now discontinued as pt focusing on VRT   Target Date 03/19/22      PT SHORT  TERM GOAL #3   Title Patient will increase ABC scale score >80% to demonstrate better functional mobility and better confidence with ADLs.    Baseline 01/09/21: 60.625% 3/3/: 50% 3/31: 63% 6/20: 45% 7/25: 40% 9/12: 51.1% 11/15: 48% 1/17: 40% 3/2: 33% 4/20: 39% 6/6: 51% 01/06/23: 46.88%   Time 6    Period Weeks    Status Partially Met    Target Date              PT Long Term Goals - target date 01/26/2023        PT LONG TERM GOAL #1   Title Patient will increase FOTO score to equal to or greater than 65 to demonstrate statistically significant improvement in mobility and quality of life.    Baseline scored 41/100 on 05/21/2020; 7/22 40, 8/23: 60/100, 09/30/20=40, 01/09/21: 46 3/3: 40.3% 3/31: 49% 6/20: 45% 7/12: 50.7% 9/12: 44.7% 11/15: 40% 1/17: 40% 3/2: 42%    Time 12    Period Weeks    Status Not Met - now discontinued as pt focusing on VRT rehab   Target Date 10/20/22       PT LONG TERM GOAL #2   Title Patient will reduce falls risk as indicated by decreased TUG time to less than 11 seconds.    Baseline scored 37.9 sec with TUG on 05/21/2020; 21seconds with elevated rollator 7/22, 8/23: 13.62 sec with up and go walker, 01/09/21: 15.81s with hiking stick 3/3: 17.98 seconds one near LOB 3/31: 15.57 seconds with walking stick 6/20: 14.8 seconds with walking stick. 7/25: 19.72 seconds with walking stick. 9/26: 17.6 seconds 11/15: unable to test due to migraine 1/17: deferred due to pain 3/2:19.25 seconds with walking stick in involved hand 4/20: deferred due to migraine and back pain 6/6: 10.5 seconds with walking stick    Time 12    Period Weeks    Status Achieved    Target Date 04/30/22      PT LONG TERM GOAL #3   Title Patient will increase right UE and LE  gross strength to 4+/5 throughout to improve functional strength for independent gait, increased standing tolerance and increased ADL ability.    Baseline grossly +3/5 to -4/5 right UE and LE strength on 05/21/2020; grossly 4-/5 for RUE, grossly 4-/5 for RLE on 7/22, grossly 4-/5 RUE, grossly 4/5 RLE 3/3: see note 3/2: see note 4/20: RLE 4-/5 with quad 4/5 RUE 4/5 with bicep 3+/5 6/6: see note: 7/25: see note   Time 12    Period Weeks    Status Partially Met - now discontinued as pt focusing on VRT   Target Date 10/20/22     PT LONG TERM GOAL #4   Title Patient will increase 10 meter walk test to >1.41m/s as to improve gait speed for better community ambulation and to reduce fall risk.    Baseline on 7/22 .57 m/sself selected, fast  .73 m/s with LOB pt reported R knee buckling, elevated rollator used, 8/23: 1.06 m/s with up and go walker, 01/09/21: 0.72 m/s with hiking stick 3/3: 0.74 m/s with walking stick 3/31: 0.81 m/s w walking stick 6/20: 1.1 m/s with walking stick    Time 12    Period Weeks    Status Achieved      PT LONG TERM GOAL #5   Title Patient will increase Berg Balance score by > 6 points to demonstrate decreased fall risk during functional activities.    Baseline 8/23: 37/56, 09/30/20=40/56, 12/20: 42/56 3/3:  deferred 3/31 will perform next time; deferred due to throwing up 6/20: 46/56 7/12 38/56    Time 8    Period Weeks    Status Achieved      Additional Long Term Goals   Additional Long Term Goals Yes      PT LONG TERM GOAL #6   Title Patient (< 15 years old) will complete five times sit to stand test in < 10 seconds indicating an increased LE strength and improved balance.    Baseline 8/23: 22 sec, 09/30/20=13.04 sec, 12/20: 27.05 sec, 01/09/21: 18.01s 3/3: 23 seconds no UE support 3/31; deferred due to patient throwing up 7/12: 15.2 seconds 11/15: 26 seconds 1/17: deferred due to pain 3/2: 17.5 seconds 4/20: deferred due to migraine and back pain 6/6: 11 seconds with SUE  support 8/22: 13.7 seconds with SUE support; 01/06/23: 12 seconds hands-free   Time 12    Period Weeks    Status Partially Met    Target Date      PT LONG TERM GOAL #7   Title Patient will improve 6 min walk test >1000 feet with LRAD for improved gait ability in community.    Baseline 8/23: not tested; 8/30 845, goal adjusted >2043ft, 09/30/20=600 feet - stopped test due to R ankle catching making gait unsafe, 11/25/20= 330 feet - stopped test due to severe dizziness, vomiting/nausea, sweating making gait unsafe; 01/09/21: 720 ft with no rest breaks 3/3: deferred due to nausea 3/31: deferred due to patient throwing up 6/20: 640 ft 7/12: 631ft with walking stick, no rest breaks; 9/26: 1000 ft    Time 12    Period Weeks    Status Achieved      PT LONG TERM GOAL #8   Title Patient will increase Berg Balance score by > 6 points  (52/56) to demonstrate decreased fall risk during functional activities.    Baseline 6/20: 46/56 7/12: 38/56 with migraine 9/12: defer to next session 11/15:  unable to test due to migraine 1/17: deferred due to pain 3/2: next session 4/20: deferred due to migraine and back pain; 01/06/23: deferred on this date, will require +2 for safety with testing   Time 12    Period Weeks    Status Partially Met    Target Date 10/20/22     PT LONG TERM GOAL  #9   TITLE Patient will report decreased frequency of falling per week to 1x/week for decreased fall risk and improved quality of life    Baseline 12/1: 4 falls minimum a week 1/17: 1 fall last week 3/2: has been bed bound the week prior 4/20: 2-3x/week 6/6: 2x/week 7/25: 2x/week; 11/28: average 3 falls/week; 12/09/22: reports 2 falls since last seen (11/03/2022); 01/06/2023: pt reports averaging 2 falls/week   Time 12    Period Weeks    Status MET    Target Date 08/04/22      PT LONG TERM GOAL  #10   TITLE Patient will turn head 75% of the time without dizziness or LOB.    Baseline 6/6: patient falls and has extreme dizziness  resulting in throwing up. 7/25: 25% of the time; 11/28: "it happens a lot 65% of the time"; 12/09/22: head turns continue to increase pt's dizziness to 4-5/10; 01/06/23: Pt reports this fluctuates, can turn head 10x some days with VOR before increase in dizziness, other days pt is highly symptomatic with head turns   Time 12    Period Weeks    Status Partially Met  Target Date 10/20/22     PT LONG TERM GOAL  #11   TITLE Patient will report a worst pain of 4/10 on VAS in cervical and lumbar  to improve tolerance with ADLs and reduced symptoms with activities.    Baseline 6/6: lumbar: 10/10, cervical 8/10 7/25: lumbar 6-7/10; cervical 5-6/10    Time 12    Period Weeks    Status Partially Met - now discontinued as pt focusing on VRT   Target Date 10/20/22              Plan     Clinical Impression Statement Goals reassessed for progress note. Pt making gains AEB improvement on 5xSTS, reported decrease in fall frequency, and improved ability to perform VORx1 prior to increase in dizzy symptoms. These findings also indicate pt has somewhat decreased his fall risk, although he still remains at an increased risk for future falls with recent hx of 2 falls. Pt did have decreased ABC scale score, indicating poor balance confidence. However, pt also reports perceived functional improvement with ability to turn head before onset of unsteadiness/dizziness, although this can fluctuate. The pt will benefit from further skilled PT to address these deficits in order to decrease fall risk, movement-triggered dizziness and to improve QOL.    Personal Factors and Comorbidities Comorbidity 3+;Time since onset of injury/illness/exacerbation    Comorbidities anxiety, back pain, COPD, headache, HTN, MI, sleep apnea, migraines    Examination-Activity Limitations Locomotion Level;Transfers;Bed Mobility;Stand;Stairs;Sleep;Squat;Bend    Examination-Participation Restrictions Driving;Medication Management     Stability/Clinical Decision Making Unstable/Unpredictable    Rehab Potential Fair    PT Frequency 1x / week    PT Duration 12 weeks    PT Treatment/Interventions ADLs/Self Care Home Management;Canalith Repostioning;Moist Heat;Electrical Stimulation;Gait training;Stair training;Functional mobility training;Therapeutic activities;Therapeutic exercise;Balance training;Neuromuscular re-education;Patient/family education;Manual techniques;Passive range of motion;Vestibular    PT Next Visit Plan VOR, nustep, manual, optokinetic training, seated EC/balance interventions within symptom tolerance   PT Home Exercise Plan Medbridge Access Code: [email or web address]    Consulted and Agree with Plan of Care Patient;Family member/caregiver    Family Member Consulted Wife               Temple Pacini PT, DPT  01/06/2023, 5:25 PM

## 2023-01-12 ENCOUNTER — Ambulatory Visit: Payer: BC Managed Care – PPO

## 2023-01-13 ENCOUNTER — Ambulatory Visit: Payer: Medicare Other

## 2023-01-19 ENCOUNTER — Ambulatory Visit: Payer: BC Managed Care – PPO

## 2023-01-20 ENCOUNTER — Ambulatory Visit: Payer: Medicare Other

## 2023-01-21 ENCOUNTER — Other Ambulatory Visit: Payer: Self-pay | Admitting: Cardiovascular Disease

## 2023-01-21 NOTE — Telephone Encounter (Signed)
Pt is scheduled on 4/29

## 2023-01-21 NOTE — Telephone Encounter (Signed)
Please schedule F/U appt for 90 day refills. Thank you!

## 2023-01-22 ENCOUNTER — Other Ambulatory Visit: Payer: Self-pay | Admitting: *Deleted

## 2023-01-22 DIAGNOSIS — I2510 Atherosclerotic heart disease of native coronary artery without angina pectoris: Secondary | ICD-10-CM

## 2023-01-22 MED ORDER — CLOPIDOGREL BISULFATE 75 MG PO TABS: 75.0000 mg | ORAL_TABLET | Freq: Every day | ORAL | 3 refills | Status: DC

## 2023-01-25 ENCOUNTER — Ambulatory Visit: Payer: Medicare Other

## 2023-01-26 ENCOUNTER — Ambulatory Visit: Payer: BC Managed Care – PPO

## 2023-01-27 ENCOUNTER — Ambulatory Visit: Payer: Medicare Other

## 2023-02-02 ENCOUNTER — Ambulatory Visit: Payer: BC Managed Care – PPO

## 2023-02-03 ENCOUNTER — Ambulatory Visit: Payer: Medicare Other

## 2023-02-09 ENCOUNTER — Ambulatory Visit: Payer: BC Managed Care – PPO

## 2023-02-10 ENCOUNTER — Other Ambulatory Visit: Payer: Self-pay | Admitting: Cardiovascular Disease

## 2023-02-10 ENCOUNTER — Telehealth: Payer: Self-pay | Admitting: Internal Medicine

## 2023-02-10 ENCOUNTER — Ambulatory Visit: Payer: 59

## 2023-02-10 MED ORDER — SPIRIVA RESPIMAT 2.5 MCG/ACT IN AERS: 2.0000 | INHALATION_SPRAY | Freq: Every day | RESPIRATORY_TRACT | 2 refills | Status: DC

## 2023-02-10 NOTE — Telephone Encounter (Signed)
I spoke with the patient's wife(DPR). She said he needs a refill on his Spiriva and they have scheduled him an appt with Dr. Mortimer Fries in May.   I have sent in a refill and notified the patient's wife.

## 2023-02-10 NOTE — Telephone Encounter (Signed)
Dena wife states patient needs refill for Spiriva. Patient scheduled 04/14/2023 with Dr. Mortimer Fries. Dena phone number is [phone].

## 2023-02-16 ENCOUNTER — Ambulatory Visit: Payer: BC Managed Care – PPO

## 2023-02-17 ENCOUNTER — Ambulatory Visit: Payer: 59 | Attending: Neurology

## 2023-02-17 DIAGNOSIS — R2681 Unsteadiness on feet: Secondary | ICD-10-CM | POA: Diagnosis present

## 2023-02-17 DIAGNOSIS — R42 Dizziness and giddiness: Secondary | ICD-10-CM | POA: Insufficient documentation

## 2023-02-17 NOTE — Therapy (Signed)
OUTPATIENT PHYSICAL THERAPY TREATMENT NOTE/RECERT   Patient Name: Cory Espinoza MRN: [id number] DOB:03-12-1971, 52 y.o., male Today's Date: 02/17/2023  PCP: [place] Pink MD REFERRING PROVIDER: Jennings Books MD      PT End of Session - 02/17/23 1555     Visit Number 101    Number of Visits 124    Date for PT Re-Evaluation 05/12/23    Authorization Type BC[employer] PPO;    PT Start Time [id number]    PT Stop Time [id number]    PT Time Calculation (min) 43 min    Equipment Utilized During Treatment Gait belt    Activity Tolerance Other (comment);Patient tolerated treatment well   limited by dizziness   Behavior During Therapy [facility] for tasks assessed/performed               Past Medical History:  Diagnosis Date   Allergy    Anginal pain (HCC)    Anxiety    Arthritis    lumbar spine   Asthma    Atrial fibrillation (HCC)    Back pain    Severe Lumbar Pain   CHF (congestive heart failure) (HCC)    COPD (chronic obstructive pulmonary disease) (HCC)    Restrictive lung disease   Coronary artery disease    Dyspnea    Dysrhythmia    afib   GERD (gastroesophageal reflux disease)    Headache    [name], onset October 2020, cluster headaches in the past   History of kidney stones    Hyperlipidemia    Hypertension    Myocardial infarction ([place])    at age 68   Pneumonia    Restrictive lung disease    Sleep apnea    unable to use cpap since onset of migraines   Past Surgical History:  Procedure Laterality Date   ABDOMINAL EXPOSURE N/A 03/25/2020   Procedure: ABDOMINAL EXPOSURE;  Surgeon: Rosetta Posner, MD;  Location: [facility];  Service: Vascular;  Laterality: N/A;   ANTERIOR CERVICAL DECOMP/DISCECTOMY FUSION N/A 01/23/2020   Procedure: ANTERIOR CERVICAL DECOMPRESSION FUSION - CERVICAL SIX-CERVICAL SEVEN;  Surgeon: Earnie Larsson, MD;  Location: [place];  Service: Neurosurgery;  Laterality: N/A;   ANTERIOR LUMBAR FUSION N/A 03/25/2020   Procedure: ANTERIOR LUMBAR INTERBODY FUSION  LUMBAR FIVE-SACRAL ONE.;  Surgeon: Earnie Larsson, MD;  Location: [place];  Service: Neurosurgery;  Laterality: N/A;  anterior   BACK SURGERY  1996   CARDIAC CATHETERIZATION Left 04/27/2016   Procedure: Left Heart Cath and Coronary Angiography;  Surgeon: Corey Skains, MD;  Location: [place] [facility] LAB;  Service: Cardiovascular;  Laterality: Left;   CARDIAC CATHETERIZATION N/A 04/27/2016   Procedure: Intravascular Pressure Wire/FFR Study;  Surgeon: Yolonda Kida, MD;  Location: [place] [facility] LAB;  Service: Cardiovascular;  Laterality: N/A;   CATHETERIZATION OF PULMONARY ARTERY WITH RETRIEVAL OF FOREIGN BODY Bilateral 04/07/2011   heart   DEGENERATIVE SPONDYLOLISTHESIS     KNEE ARTHROSCOPY Bilateral 2004   LUMBAR DISC SURGERY  1999   RADICULOPATHY, CERVICAL REGION     TONSILLECTOMY     Patient Active Problem List   Diagnosis Date Noted   [name] (coronary artery disease) 10/21/2021   Degenerative spondylolisthesis 03/25/2020   Physical deconditioning 03/04/2020   Lobar pneumonia, unspecified organism ([place]) 03/04/2020   Intractable hemiplegic migraine with status migrainosus 03/02/2020   Current tobacco use 02/05/2020   Abnormal findings on diagnostic imaging of lung 02/05/2020   Shortness of breath 02/05/2020   Herniated disc, cervical 01/23/2020  Cervical spondylosis with myelopathy and radiculopathy 01/23/2020   Pre-operative respiratory examination 01/12/2020   Weakness on right side of face 12/22/2019   Osteoarthritis of spine with radiculopathy, lumbar region 12/11/2019   Intractable migraine with aura with status migrainosus 11/09/2019   Former smoker 08/27/2018   Benign essential HTN 08/27/2018   Cough productive of purulent sputum 11/08/2017   GERD (gastroesophageal reflux disease) 05/06/2017   Contusion of hand 04/22/2017   BP (high blood pressure) 04/22/2017   Oxygen desaturation 04/22/2017   Prostate lump 04/22/2017   Unstable angina ([place]) 04/27/2016   Chest pain  04/24/2016   Morbid obesity ([place]) 01/29/2016   Elevated ALT measurement 10/30/2015   Nonspecific elevation of levels of transaminase and lactic acid dehydrogenase (ldh) 10/30/2015   Reduced libido 10/29/2015   Hyperlipidemia 08/06/2015   Anxiety 08/06/2015   Atherosclerosis of coronary artery 08/06/2015   Childhood asthma 08/06/2015   Chronic obstructive pulmonary disease ([place]) 08/06/2015   OSA (obstructive sleep apnea) 08/06/2015   Anxiety disorder 08/06/2015   Atherosclerosis of native coronary artery of native heart with stable angina pectoris ([place]) [id number]   Uncomplicated asthma 123XX123    REFERRING DIAG: BPPV/unsteadiness  THERAPY DIAG:  Dizziness and giddiness  Unsteadiness on feet  SUBJECTIVE: Pt reports he has had a few falls since he was  last seen, and has missed previous appointments due to significant symptoms and/or illness. One fall occurred yesterday where pt hit his head, he reports his head and neck did hurt yesterday, but reports he is feeling good today. Pt reports overall dizziness has "not been bad."   Pt has been practicing HEP.  PAIN:  Are you having pain? Yes: NPRS scale: not reported/10 Pain location: head, neck/back Pain description: pain following fall Aggravating factors: turning head, weather Relieving factors: medicine, dark room  Rationale for Evaluation and Treatment Rehabilitation  PERTINENT HISTORY: Pt has a complex medical history. 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 He states that he fell 10-12 feet and struck his head, neck, and back. He was able to finish the course which took him approximately 45 minutes more to complete. He does endorse a second fall while finishing the course but did not suffer a second head injury. He states that he started getting headaches that day which have persisted since that time. He is currently under the care of neurology who is treating him for hemiplegic  migraines and R occipital neuralgia. He does report improvement in his headaches recently. Neurology was concerned about possible BPPV and have referred him for a vestibular evaluation. In addition since the injury, pt developed RUE and RLE pain and weakness. Pt states that NCV showed abnormal nerve conduction in RUE. He has since undergone a C7-7 ACDF on 01/23/20. He reports initial improvement in RUE strength, but states that he has a gradual return of his RUE weakness. He was also having significant RLE weakness and pain and underwent and L5-S1 anterior lumbar interbody fusion on 03/25/20. Patient reports that he does not have any back precautions but needs to wear the low back brace for one more month. Patient reports he has a follow-up appointment with the surgeon next month. Patient reports he has a follow-up appointment with Dr. Brigitte Pulse, neurologist, in August. He arrives to therapy ambulating with an upright rollator walker. Pt denies any mention of concussion after his injury. He has been having cognitive issues since his injury and has previously had a heavy metals  screen as well as neurocognitive testing. He has had an MRI of his brain with and without contrast on 12/18/19. Results were mildly motion degraded examination. No evidence of acute intracranial abnormality. Minimal chronic small vessel ischemic disease. He has tremor in both his hands. He has a positive family history of Parkinson's disease in his grandfather. He is complaining of constant dizziness and unsteadiness which are worse with activity. Patient states that he frequently loses his balance and his wife has to steady him.   Describe your dizziness without using the term "dizzy:" Pt states he feels unsteady, spinning, nausea,swimmy-headed at times.  He reports spinning can last minutes or longer. Pt is taking meclizine, reports he usually takes prior to PT, including today. Pt has an inhaler with peppermint he uses for nausea. Pt has vomited  before due to vertigo. Vertigo has been on and off since 2020. Pt spouse reports pt fell a couple days ago at night going to the bathroom. Pt hit his head. He states "too many falls in the last six months." Pt cannot always get up when falls to the floor. Pt spouse reports pt had recent seizure that lasted longer than normal (PTSD type seizures). Seizures(?) typically can last from 10 to 24 hours. Yawning frequently is a sign of oncoming seizure for pt. Pt has interrupted sleep. He has neck pain but has been using a massager for his neck and found this to help a bit. Some days his neck pain ranges from 1-2/10, some days 7-8/10. Pt can experience double vision with migraines.Other symptoms include impaired sensation in R leg.  How long does your vertigo last (seconds, hours, >24 hours): Has experienced spinning that lasts over 24 hours  Any other symptoms associated with your dizziness? Unsure if dizzier with migraine symptoms   Do you have trouble with going into busy stores, using screens, or being a passenger in a car and seeing other cars go by?  -issues with being a passenger in a car and watching cars go by  -no issue with screens   -issue with busy stores   What increases likelihood of a migraine? Constantly has a migraine, pt reports the intensity can improve, he has come down to a 2/10 before, can be 5-10/10. Pt getting infusions currently, and thinks they're working. Strong smells can worsen migraine.   What tends to make it more likely you'll have a seizure? Flashing lights, stress and when pt feels angry reports he can have some trouble controlling emotions at times.  PRECAUTIONS: Falls    TODAY'S TREATMENT:    NMR: Review of goals How often are you falling? Pt reports average of 2-3x/week   How often do you feel dizzy when turning your head? Can turn head up to 10x, but reports he does still feel dizzy most of the time when he turns his head   Have you practiced Optokinetic  training HEP video ([email or web address]) Has not practiced video  Pt performed several minutes of the following interventions: Compensation and habituation interventions- Shift gaze then turn head L and R individually through full and reduced ROM, L and R together (1 arc), and with superior and inferior head tilt both in part and whole. Recovery intervals between sets  Reviewed updated HEP with pt and instructed pt to continue focusing on gaze stabilization exercises, seated and to tolerance, and to start with L and R head turn exercises. Progress to other exercises only within symptom tolerance. Pt verbalized understanding and demonstrated correct technique in  session.  Following interventions PT ambulated with pt from clinic to his car with pt using SPC, PT providing CGA. Pt with improved steadiness with addition of dual cog task, PT asking pt to describe various woodworking projects.   PATIENT EDUCATION: Education details: goal retesting, indications, update to HEP Person educated: Patient Education method: Explanation, Demonstration, and Verbal cues Education comprehension: verbalized understanding, returned demonstration, and verbal cues required   HOME EXERCISE PROGRAM:   02/17/23: Access Code: [email or web address] URL: [email or web address] Date: 02/17/2023 Prepared by: Ricard Dillon  Exercises - Seated Left Head Turns Vestibular Habituation  - 1 x daily - 5 x weekly - 2 sets - 5-10 reps - Seated Right Head Turns Vestibular Habituation  - 1 x daily - 5 x weekly - 2 sets - 5-10 reps - Seated Head Nods Vestibular Habituation  - 1 x daily - 5 x weekly - 2 sets - 5-10 reps - Vestibular Habituation - Head Tilting  - 1 x daily - 5 x weekly - 2 sets - 5-10 reps - Seated Gaze Stabilization with Head Rotation  - 1 x daily - 7 x weekly - 3 sets - 1 reps - 30-60 seconds hold - Seated Gaze Stabilization with Head Nod  - 1 x daily - 7 x weekly - 3 sets - 1 reps -  30-60 seconds hold  01/06/23:  Driving Optokinetic training video [email or web address]. Added video to HEP to perform 1x/daily (do not perform if having a high symptom day, however) until symptoms reach 2/10, then rest. Watch video to tolerance. Do not perform this back to back with VOR intervention, allow for sufficient rest until symptoms have decreased back to 0/10.  09/15/2022: Access Code: [email or web address] URL: [email or web address] Date: 09/15/2022 Prepared by: Ricard Dillon  Exercises - Seated Gaze Stabilization with Head Rotation  - 1 x daily - 7 x weekly - 3 sets - 2 reps - 30 sec hold    PT Short Term Goals  target date 02/17/2023          PT SHORT TERM GOAL #1   Title Patient will be independent in home exercise program to improve strength/mobility for better functional independence with ADLs and for self-management.    Baseline 3/3 HEP compliant 3/31 hep compliant    Time 6    Period Weeks    Status Achieved    Target Date 02/20/21      PT SHORT TERM GOAL #2   Title Patient will be modified independent in walking on even/uneven surface with least restrictive assistive device, for 10+ minutes without rest break, reporting some difficulty or less to improve walking tolerance with community ambulation including grocery shopping, going to church,etc.    Baseline 02/03: instability, LOB multiple times while ambulating to elevator 3/31: unable to test due to throwing up 6/20: 6 minutes 7/12: unable to test due to migraine 9/12: defer to next session 11/15: unable to test due to migraine 3/2: requires heavy use of AD and seated rest breaks 4/20: walked around yard yesterday    Time 6    Period Weeks    Status Previously partially Met  - now discontinued as pt focusing on VRT   Target Date 03/19/22      PT SHORT TERM GOAL #3   Title Patient will increase ABC scale score >80% to demonstrate better functional mobility and better confidence with ADLs.     Baseline 01/09/21: 60.625% 3/3/: 50% 3/31: 63% 6/20: 45% 7/25: 40% 9/12: 51.1% 11/15: 48% 1/17: 40% 3/2: 33%  4/20: 39% 6/6: 51% 01/06/23: 46.88%   Time 6    Period Weeks    Status Partially Met    Target Date              PT Long Term Goals - target date 01/26/2023        PT LONG TERM GOAL #1   Title Patient will increase FOTO score to equal to or greater than 65 to demonstrate statistically significant improvement in mobility and quality of life.    Baseline scored 41/100 on 05/21/2020; 7/22 40, 8/23: 60/100, 09/30/20=40, 01/09/21: 46 3/3: 40.3% 3/31: 49% 6/20: 45% 7/12: 50.7% 9/12: 44.7% 11/15: 40% 1/17: 40% 3/2: 42%    Time 12    Period Weeks    Status Not Met - now discontinued as pt focusing on VRT rehab   Target Date 10/20/22       PT LONG TERM GOAL #2   Title Patient will reduce falls risk as indicated by decreased TUG time to less than 11 seconds.    Baseline scored 37.9 sec with TUG on 05/21/2020; 21seconds with elevated rollator 7/22, 8/23: 13.62 sec with up and go walker, 01/09/21: 15.81s with hiking stick 3/3: 17.98 seconds one near LOB 3/31: 15.57 seconds with walking stick 6/20: 14.8 seconds with walking stick. 7/25: 19.72 seconds with walking stick. 9/26: 17.6 seconds 11/15: unable to test due to migraine 1/17: deferred due to pain 3/2:19.25 seconds with walking stick in involved hand 4/20: deferred due to migraine and back pain 6/6: 10.5 seconds with walking stick    Time 12    Period Weeks    Status Achieved    Target Date 04/30/22      PT LONG TERM GOAL #3   Title Patient will increase right UE and LE gross strength to 4+/5 throughout to improve functional strength for independent gait, increased standing tolerance and increased ADL ability.    Baseline grossly +3/5 to -4/5 right UE and LE strength on 05/21/2020; grossly 4-/5 for RUE, grossly 4-/5 for RLE on 7/22, grossly 4-/5 RUE, grossly 4/5 RLE 3/3: see note 3/2: see note 4/20: RLE 4-/5 with quad 4/5 RUE 4/5  with bicep 3+/5 6/6: see note: 7/25: see note   Time 12    Period Weeks    Status Partially Met - now discontinued as pt focusing on VRT   Target Date 10/20/22     PT LONG TERM GOAL #4   Title Patient will increase 10 meter walk test to >1.57ms as to improve gait speed for better community ambulation and to reduce fall risk.    Baseline on 7/22 .57 m/sself selected, fast  .73 m/s with LOB pt reported R knee buckling, elevated rollator used, 8/23: 1.06 m/s with up and go walker, 01/09/21: 0.72 m/s with hiking stick 3/3: 0.74 m/s with walking stick 3/31: 0.81 m/s w walking stick 6/20: 1.1 m/s with walking stick    Time 12    Period Weeks    Status Achieved      PT LONG TERM GOAL #5   Title Patient will increase Berg Balance score by > 6 points to demonstrate decreased fall risk during functional activities.    Baseline 8/23: 37/56, 09/30/20=40/56, 12/20: 42/56 3/3: deferred 3/31 will perform next time; deferred due to throwing up 6/20: 46/56 7/12 38/56    Time 8    Period Weeks    Status Achieved      Additional Long Term Goals   Additional Long Term Goals  Yes      PT LONG TERM GOAL #6   Title Patient (< 57 years old) will complete five times sit to stand test in < 10 seconds indicating an increased LE strength and improved balance.    Baseline 8/23: 22 sec, 09/30/20=13.04 sec, 12/20: 27.05 sec, 01/09/21: 18.01s 3/3: 23 seconds no UE support 3/31; deferred due to patient throwing up 7/12: 15.2 seconds 11/15: 26 seconds 1/17: deferred due to pain 3/2: 17.5 seconds 4/20: deferred due to migraine and back pain 6/6: 11 seconds with SUE support 8/22: 13.7 seconds with SUE support; 01/06/23: 12 seconds hands-free   Time 12    Period Weeks    Status Partially Met    Target Date      PT LONG TERM GOAL #7   Title Patient will improve 6 min walk test >1000 feet with LRAD for improved gait ability in community.    Baseline 8/23: not tested; 8/30 845, goal adjusted >2070f, 09/30/20=600 feet -  stopped test due to R ankle catching making gait unsafe, 11/25/20= 330 feet - stopped test due to severe dizziness, vomiting/nausea, sweating making gait unsafe; 01/09/21: 720 ft with no rest breaks 3/3: deferred due to nausea 3/31: deferred due to patient throwing up 6/20: 640 ft 7/12: 6649fwith walking stick, no rest breaks; 9/26: 1000 ft    Time 12    Period Weeks    Status Achieved      PT LONG TERM GOAL #8   Title Patient will increase Berg Balance score by > 6 points  (52/56) to demonstrate decreased fall risk during functional activities.    Baseline 6/20: 46/56 7/12: 38/56 with migraine 9/12: defer to next session 11/15:  unable to test due to migraine 1/17: deferred due to pain 3/2: next session 4/20: deferred due to migraine and back pain; 01/06/23: deferred on this date, will require +2 for safety with testing   Time 12    Period Weeks    Status Partially Met    Target Date 10/20/22     PT LONG TERM GOAL  #9   TITLE Patient will report decreased frequency of falling per week to 1x/week for decreased fall risk and improved quality of life    Baseline 12/1: 4 falls minimum a week 1/17: 1 fall last week 3/2: has been bed bound the week prior 4/20: 2-3x/week 6/6: 2x/week 7/25: 2x/week; 11/28: average 3 falls/week; 12/09/22: reports 2 falls since last seen (11/03/2022); 01/06/2023: pt reports averaging 2 falls/week; 02/17/2023: Pt reports average of 2-3x/week    Time 12    Period Weeks    Status MET    Target Date 08/04/22      PT LONG TERM GOAL  #10   TITLE Patient will turn head 75% of the time without dizziness or LOB.    Baseline 6/6: patient falls and has extreme dizziness resulting in throwing up. 7/25: 25% of the time; 11/28: "it happens a lot 65% of the time"; 12/09/22: head turns continue to increase pt's dizziness to 4-5/10; 01/06/23: Pt reports this fluctuates, can turn head 10x some days with VOR before increase in dizziness, other days pt is highly symptomatic with head turns    Time 12    Period Weeks    Status Partially Met   Target Date 10/20/22     PT LONG TERM GOAL  #11   TITLE Patient will report a worst pain of 4/10 on VAS in cervical and lumbar  to improve tolerance with ADLs  and reduced symptoms with activities.    Baseline 6/6: lumbar: 10/10, cervical 8/10 7/25: lumbar 6-7/10; cervical 5-6/10    Time 12    Period Weeks    Status Partially Met - now discontinued as pt focusing on VRT   Target Date 10/20/22              Plan     Clinical Impression Statement Pt returns after absence due to further illness and significant symptom increase. PT recommends to pt, and his friend who is present for appointment, that pt follow-up with neurologist regarding medications he could take for his vertigo as pt reports he is not sure if he is currently taking any PT sending recert today, reviewing a couple goals, however goals retested at previous visit, please refer to previous PT note. PT also updated HEP with multiple habituation interventions in seated and instructed pt in compensatory gaze shift with head movement. Pt demonstrated correct technique in session and understands to perform only within symptom tolerance. PT also recommends trial period of pt performing HEP on his own for approx 1 month prior to returning to next appointment. Will determine then if pt still able to make progress. Pt agreeable to plan. Patient's condition has the potential to improve in response to therapy. Maximum improvement is yet to be obtained. The anticipated improvement is attainable and reasonable in a generally predictable time.    The pt will benefit from further skilled PT to address these deficits in order to decrease fall risk, movement-triggered dizziness and to improve QOL.    Personal Factors and Comorbidities Comorbidity 3+;Time since onset of injury/illness/exacerbation    Comorbidities anxiety, back pain, COPD, headache, HTN, MI, sleep apnea, migraines     Examination-Activity Limitations Locomotion Level;Transfers;Bed Mobility;Stand;Stairs;Sleep;Squat;Bend    Examination-Participation Restrictions Driving;Medication Management    Stability/Clinical Decision Making Unstable/Unpredictable    Rehab Potential Fair    PT Frequency 1x / week    PT Duration 12 weeks    PT Treatment/Interventions ADLs/Self Care Home Management;Canalith Repostioning;Moist Heat;Electrical Stimulation;Gait training;Stair training;Functional mobility training;Therapeutic activities;Therapeutic exercise;Balance training;Neuromuscular re-education;Patient/family education;Manual techniques;Passive range of motion;Vestibular    PT Next Visit Plan VOR, nustep, manual, optokinetic training, seated EC/balance interventions within symptom tolerance   PT Home Exercise Plan Medbridge Access Code: [email or web address]    Consulted and Agree with Plan of Care Patient;Family member/caregiver    Family Member Consulted Wife               Ricard Dillon PT, DPT  02/17/2023, 5:15 PM

## 2023-02-23 ENCOUNTER — Ambulatory Visit: Payer: BC Managed Care – PPO

## 2023-02-24 ENCOUNTER — Ambulatory Visit: Payer: 59

## 2023-03-02 ENCOUNTER — Ambulatory Visit: Payer: BC Managed Care – PPO

## 2023-03-03 ENCOUNTER — Ambulatory Visit: Payer: 59

## 2023-03-09 ENCOUNTER — Ambulatory Visit: Payer: BC Managed Care – PPO

## 2023-03-10 ENCOUNTER — Ambulatory Visit: Payer: 59

## 2023-03-16 ENCOUNTER — Ambulatory Visit: Payer: BC Managed Care – PPO

## 2023-03-17 ENCOUNTER — Encounter: Payer: Self-pay | Admitting: Cardiovascular Disease

## 2023-03-17 ENCOUNTER — Ambulatory Visit: Payer: 59

## 2023-03-22 ENCOUNTER — Other Ambulatory Visit: Payer: Self-pay | Admitting: Cardiovascular Disease

## 2023-03-23 ENCOUNTER — Ambulatory Visit: Payer: BC Managed Care – PPO

## 2023-03-24 ENCOUNTER — Ambulatory Visit: Payer: 59

## 2023-03-26 ENCOUNTER — Telehealth: Payer: Self-pay | Admitting: Cardiovascular Disease

## 2023-03-26 NOTE — Telephone Encounter (Signed)
Primary Cardiologist:Cory Mariah Milling, MD  Chart reviewed as part of pre-operative protocol coverage. Because of Cory Espinoza's past medical history and time since last visit, he/she will require a follow-up visit in order to better assess preoperative cardiovascular risk.  Pre-op covering staff: - Patient has an appointment with Dr. Mariah Espinoza on 04/05/23 at which time clearance can be addressed. Appointment notes have been updated.  - Please contact requesting surgeon's office via preferred method (i.e, phone, fax) to inform them of need for appointment prior to surgery.  If applicable, this message will also be routed to pharmacy pool and/or primary cardiologist for input on holding anticoagulant/antiplatelet agent as requested below so that this information is available at time of patient's appointment.   Levi Aland, NP-C  03/26/2023, 11:29 AM [address], [address] Office [phone] Fax [phone]

## 2023-03-26 NOTE — Telephone Encounter (Signed)
   Pre-operative Risk Assessment    Patient Name: Cory Espinoza  DOB: 12-07-1971 MRN: [id number]      Request for Surgical Clearance    Procedure:   COLONOSCOPY  Date of Surgery:  Clearance 06/30/23                               Surgeon:  DR Bing Plume Surgeon's Group or Practice Name:  [facility] Phone number:  [phone] Fax number:  [phone]  Type of Clearance Requested:   - Pharmacy:  Hold Clopidogrel (Plavix) PREOP 5 DAYS    Type of Anesthesia:  Not Indicated   Additional requests/questions:    Signed, Dalia Heading   03/26/2023, 11:16 AM

## 2023-03-26 NOTE — Telephone Encounter (Signed)
Requesting office made aware of upcoming appointment for cardiac clearance

## 2023-03-30 ENCOUNTER — Ambulatory Visit: Payer: BC Managed Care – PPO

## 2023-03-31 ENCOUNTER — Ambulatory Visit: Payer: 59 | Attending: Neurology

## 2023-03-31 DIAGNOSIS — R42 Dizziness and giddiness: Secondary | ICD-10-CM | POA: Diagnosis not present

## 2023-03-31 DIAGNOSIS — R2681 Unsteadiness on feet: Secondary | ICD-10-CM | POA: Diagnosis present

## 2023-03-31 NOTE — Therapy (Signed)
OUTPATIENT PHYSICAL THERAPY TREATMENT NOTE   Patient Name: Cory Espinoza MRN: [id number] DOB:[birth year], 51 y.o., male Today's Date: 03/31/2023  PCP: Jerl Mina MD REFERRING PROVIDER: Cristopher Peru MD      PT End of Session - 03/31/23 1645     Visit Number [id number]    Number of Visits 124    Date for PT Re-Evaluation 05/12/23    Authorization Type [employer] PPO;    PT Start Time 1602    PT Stop Time 1640    PT Time Calculation (min) 38 min    Equipment Utilized During Treatment Gait belt    Activity Tolerance Other (comment);Patient tolerated treatment well   limited by dizziness   Behavior During Therapy [facility] for tasks assessed/performed                Past Medical History:  Diagnosis Date   Allergy    Anginal pain    Anxiety    Arthritis    lumbar spine   Asthma    Atrial fibrillation    Back pain    Severe Lumbar Pain   CHF (congestive heart failure)    COPD (chronic obstructive pulmonary disease)    Restrictive lung disease   Coronary artery disease    Dyspnea    Dysrhythmia    afib   GERD (gastroesophageal reflux disease)    Headache    [name], onset October 2020, cluster headaches in the past   History of kidney stones    Hyperlipidemia    Hypertension    Myocardial infarction    at age 72   Pneumonia    Restrictive lung disease    Sleep apnea    unable to use cpap since onset of migraines   Past Surgical History:  Procedure Laterality Date   ABDOMINAL EXPOSURE N/A 03/25/2020   Procedure: ABDOMINAL EXPOSURE;  Surgeon: Larina Earthly, MD;  Location: [facility] OR;  Service: Vascular;  Laterality: N/A;   ANTERIOR CERVICAL DECOMP/DISCECTOMY FUSION N/A 01/23/2020   Procedure: ANTERIOR CERVICAL DECOMPRESSION FUSION - CERVICAL SIX-CERVICAL SEVEN;  Surgeon: Julio Sicks, MD;  Location: [facility] OR;  Service: Neurosurgery;  Laterality: N/A;   ANTERIOR LUMBAR FUSION N/A 03/25/2020   Procedure: ANTERIOR LUMBAR INTERBODY FUSION LUMBAR FIVE-SACRAL ONE.;  Surgeon:  Julio Sicks, MD;  Location: [facility] OR;  Service: Neurosurgery;  Laterality: N/A;  anterior   BACK SURGERY  1996   CARDIAC CATHETERIZATION Left 04/27/2016   Procedure: Left Heart Cath and Coronary Angiography;  Surgeon: Lamar Blinks, MD;  Location: [facility] INVASIVE CV LAB;  Service: Cardiovascular;  Laterality: Left;   CARDIAC CATHETERIZATION N/A 04/27/2016   Procedure: Intravascular Pressure Wire/FFR Study;  Surgeon: Alwyn Pea, MD;  Location: [facility] INVASIVE CV LAB;  Service: Cardiovascular;  Laterality: N/A;   CATHETERIZATION OF PULMONARY ARTERY WITH RETRIEVAL OF FOREIGN BODY Bilateral 04/07/2011   heart   DEGENERATIVE SPONDYLOLISTHESIS     KNEE ARTHROSCOPY Bilateral 2004   LUMBAR DISC SURGERY  1999   RADICULOPATHY, CERVICAL REGION     TONSILLECTOMY     Patient Active Problem List   Diagnosis Date Noted   [name] (coronary artery disease) 10/21/2021   Degenerative spondylolisthesis 03/25/2020   Physical deconditioning 03/04/2020   Lobar pneumonia, unspecified organism 03/04/2020   Intractable hemiplegic migraine with status migrainosus 03/02/2020   Current tobacco use 02/05/2020   Abnormal findings on diagnostic imaging of lung 02/05/2020   Shortness of breath 02/05/2020   Herniated disc, cervical 01/23/2020   Cervical spondylosis with myelopathy and  radiculopathy 01/23/2020   Pre-operative respiratory examination 01/12/2020   Weakness on right side of face 12/22/2019   Osteoarthritis of spine with radiculopathy, lumbar region 12/11/2019   Intractable migraine with aura with status migrainosus 11/09/2019   Former smoker 08/27/2018   Benign essential HTN 08/27/2018   Cough productive of purulent sputum 11/08/2017   GERD (gastroesophageal reflux disease) 05/06/2017   Contusion of hand 04/22/2017   BP (high blood pressure) 04/22/2017   Oxygen desaturation 04/22/2017   Prostate lump 04/22/2017   Unstable angina 04/27/2016   Chest pain 04/24/2016   Morbid obesity 01/29/2016   Elevated  ALT measurement 10/30/2015   Nonspecific elevation of levels of transaminase and lactic acid dehydrogenase (ldh) 10/30/2015   Reduced libido 10/29/2015   Hyperlipidemia 08/06/2015   Anxiety 08/06/2015   Atherosclerosis of coronary artery 08/06/2015   Childhood asthma 08/06/2015   Chronic obstructive pulmonary disease 08/06/2015   OSA (obstructive sleep apnea) 08/06/2015   Anxiety disorder 08/06/2015   Atherosclerosis of native coronary artery of native heart with stable angina pectoris 08/06/2015   Uncomplicated asthma 08/06/2015    REFERRING DIAG: BPPV/unsteadiness  THERAPY DIAG:  Dizziness and giddiness  Unsteadiness on feet  SUBJECTIVE: Pt reports he is able to do 5-6 head turns at a steady pace depending on the day. Pt reports stumbling yesterday. He also fell a week ago in his wood shop where he hit his head. Pt reports the room was spinning with this fall and he was nauseated. He also experience R hand numbness (chronic issue) but a bit more frequently since fall.  PAIN:  Are you having pain? Yes: NPRS scale: not reported/10 Pain location: head, neck/back Pain description: pain following fall Aggravating factors: turning head, weather Relieving factors: medicine, dark room  Rationale for Evaluation and Treatment Rehabilitation  PERTINENT HISTORY: Pt has a complex medical history. 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 He states that he fell 10-12 feet and struck his head, neck, and back. He was able to finish the course which took him approximately 45 minutes more to complete. He does endorse a second fall while finishing the course but did not suffer a second head injury. He states that he started getting headaches that day which have persisted since that time. 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 He does report improvement in his headaches recently.  Neurology was concerned about possible BPPV and have referred him for a vestibular evaluation. In addition since the injury, pt developed RUE and RLE pain and weakness. Pt states that NCV showed abnormal nerve conduction in RUE. He has since undergone a C7-7 ACDF on 01/23/20. He reports initial improvement in RUE strength, but states that he has a gradual return of his RUE weakness. He was also having significant RLE weakness and pain and underwent and L5-S1 anterior lumbar interbody fusion on 03/25/20. Patient reports that he does not have any back precautions but needs to wear the low back brace for one more month. Patient reports he has a follow-up appointment with the surgeon next month. Patient reports he has a follow-up appointment with Dr. Clelia Croft, neurologist, in August. He arrives to therapy ambulating with an upright rollator walker. Pt denies any mention of concussion after his injury. He has been having cognitive issues since his injury and has previously had a heavy metals screen as well as neurocognitive testing. He has had  an MRI of his brain with and without contrast on 12/18/19. Results were mildly motion degraded examination. No evidence of acute intracranial abnormality. Minimal chronic small vessel ischemic disease. He has tremor in both his hands. He has a positive family history of Parkinson's disease in his grandfather. He is complaining of constant dizziness and unsteadiness which are worse with activity. Patient states that he frequently loses his balance and his wife has to steady him.   Describe your dizziness without using the term "dizzy:" Pt states he feels unsteady, spinning, nausea,swimmy-headed at times.  He reports spinning can last minutes or longer. Pt is taking meclizine, reports he usually takes prior to PT, including today. Pt has an inhaler with peppermint he uses for nausea. Pt has vomited before due to vertigo. Vertigo has been on and off since 2020. Pt spouse reports pt fell a  couple days ago at night going to the bathroom. Pt hit his head. He states "too many falls in the last six months." Pt cannot always get up when falls to the floor. Pt spouse reports pt had recent seizure that lasted longer than normal (PTSD type seizures). Seizures(?) typically can last from 10 to 24 hours. Yawning frequently is a sign of oncoming seizure for pt. Pt has interrupted sleep. He has neck pain but has been using a massager for his neck and found this to help a bit. Some days his neck pain ranges from 1-2/10, some days 7-8/10. Pt can experience double vision with migraines.Other symptoms include impaired sensation in R leg.  How long does your vertigo last (seconds, hours, >24 hours): Has experienced spinning that lasts over 24 hours  Any other symptoms associated with your dizziness? Unsure if dizzier with migraine symptoms   Do you have trouble with going into busy stores, using screens, or being a passenger in a car and seeing other cars go by?  -issues with being a passenger in a car and watching cars go by  -no issue with screens   -issue with busy stores   What increases likelihood of a migraine? Constantly has a migraine, pt reports the intensity can improve, he has come down to a 2/10 before, can be 5-10/10. Pt getting infusions currently, and thinks they're working. Strong smells can worsen migraine.   What tends to make it more likely you'll have a seizure? Flashing lights, stress and when pt feels angry reports he can have some trouble controlling emotions at times.  PRECAUTIONS: Falls    TODAY'S TREATMENT:    NMR:  Provided pt with patient concussion fact sheet handout from [employer] as pt with hx TBI, multiple concussions   Reviewed need for reassessment of Dix-Hallpike considering recent fall, and multiple past falls. Discussed may need to trial maneuvers if pt dizzy with testing even if no nystagmus present. Vestibular goggles donned  to remove fixation: R and L Dix-Hallpike tested. No nystagmus noted with R Dix-Hallpike and pt reports no dizziness. No nystagmus present but pt did report dizziness and nausea with L Dix-Hallpike, including sitting up from position.  PT provided Left Epley 1x today. Informed pt in post-maneuver precautions. Pt dizziness did reach 8/10, decreased below 5/10 within 10 minutes.  Rest/recovery interval taken   PT +2 ambulated with pt from clinic to car, only one instance of pt decrease in postural stability, but pt able to self correct with step strategy.    PATIENT EDUCATION: Education details:  Dix-Hallpike and Epley maneuver, post maneuver precautions, reason  to attempt Epley, instruction will need to reassess next visit  Person educated: Patient and Spouse Education method: Explanation Education comprehension: verbalized understanding   HOME EXERCISE PROGRAM:   02/17/23: Access Code: [email or web address] URL: [email or web address] Date: 02/17/2023 Prepared by: Temple Pacini  Exercises - Seated Left Head Turns Vestibular Habituation  - 1 x daily - 5 x weekly - 2 sets - 5-10 reps - Seated Right Head Turns Vestibular Habituation  - 1 x daily - 5 x weekly - 2 sets - 5-10 reps - Seated Head Nods Vestibular Habituation  - 1 x daily - 5 x weekly - 2 sets - 5-10 reps - Vestibular Habituation - Head Tilting  - 1 x daily - 5 x weekly - 2 sets - 5-10 reps - Seated Gaze Stabilization with Head Rotation  - 1 x daily - 7 x weekly - 3 sets - 1 reps - 30-60 seconds hold - Seated Gaze Stabilization with Head Nod  - 1 x daily - 7 x weekly - 3 sets - 1 reps - 30-60 seconds hold  01/06/23:  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 Added video to HEP to perform 1x/daily (do not perform if having a high symptom day, however) until symptoms reach 2/10, then rest. Watch video to tolerance. Do not perform this back to back with VOR intervention, allow for sufficient rest  until symptoms have decreased back to 0/10.  09/15/2022: Access Code: [email or web address] URL: [email or web address] Date: 09/15/2022 Prepared by: Temple Pacini  Exercises - Seated Gaze Stabilization with Head Rotation  - 1 x daily - 7 x weekly - 3 sets - 2 reps - 30 sec hold    PT Short Term Goals  target date 02/17/2023          PT SHORT TERM GOAL #1   Title Patient will be independent in home exercise program to improve strength/mobility for better functional independence with ADLs and for self-management.    Baseline 3/3 HEP compliant 3/31 hep compliant    Time 6    Period Weeks    Status Achieved    Target Date 02/20/21      PT SHORT TERM GOAL #2   Title Patient will be modified independent in walking on even/uneven surface with least restrictive assistive device, for 10+ minutes without rest break, reporting some difficulty or less to improve walking tolerance with community ambulation including grocery shopping, going to church,etc.    Baseline 02/03: instability, LOB multiple times while ambulating to elevator 3/31: unable to test due to throwing up 6/20: 6 minutes 7/12: unable to test due to migraine 9/12: defer to next session 11/15: unable to test due to migraine 3/2: requires heavy use of AD and seated rest breaks 4/20: walked around yard yesterday    Time 6    Period Weeks    Status Previously partially Met  - now discontinued as pt focusing on VRT   Target Date 03/19/22      PT SHORT TERM GOAL #3   Title Patient will increase ABC scale score >80% to demonstrate better functional mobility and better confidence with ADLs.    Baseline 01/09/21: 60.625% 3/3/: 50% 3/31: 63% 6/20: 45% 7/25: 40% 9/12: 51.1% 11/15: 48% 1/17: 40% 3/2: 33% 4/20: 39% 6/6: 51% 01/06/23: 46.88%   Time 6    Period Weeks    Status Partially Met    Target Date              PT Long Term Goals - target date 01/26/2023  PT LONG TERM GOAL #1   Title Patient will increase FOTO  score to equal to or greater than 65 to demonstrate statistically significant improvement in mobility and quality of life.    Baseline scored 41/100 on 05/21/2020; 7/22 40, 8/23: 60/100, 09/30/20=40, 01/09/21: 46 3/3: 40.3% 3/31: 49% 6/20: 45% 7/12: 50.7% 9/12: 44.7% 11/15: 40% 1/17: 40% 3/2: 42%    Time 12    Period Weeks    Status Not Met - now discontinued as pt focusing on VRT rehab   Target Date 10/20/22       PT LONG TERM GOAL #2   Title Patient will reduce falls risk as indicated by decreased TUG time to less than 11 seconds.    Baseline scored 37.9 sec with TUG on 05/21/2020; 21seconds with elevated rollator 7/22, 8/23: 13.62 sec with up and go walker, 01/09/21: 15.81s with hiking stick 3/3: 17.98 seconds one near LOB 3/31: 15.57 seconds with walking stick 6/20: 14.8 seconds with walking stick. 7/25: 19.72 seconds with walking stick. 9/26: 17.6 seconds 11/15: unable to test due to migraine 1/17: deferred due to pain 3/2:19.25 seconds with walking stick in involved hand 4/20: deferred due to migraine and back pain 6/6: 10.5 seconds with walking stick    Time 12    Period Weeks    Status Achieved    Target Date 04/30/22      PT LONG TERM GOAL #3   Title Patient will increase right UE and LE gross strength to 4+/5 throughout to improve functional strength for independent gait, increased standing tolerance and increased ADL ability.    Baseline grossly +3/5 to -4/5 right UE and LE strength on 05/21/2020; grossly 4-/5 for RUE, grossly 4-/5 for RLE on 7/22, grossly 4-/5 RUE, grossly 4/5 RLE 3/3: see note 3/2: see note 4/20: RLE 4-/5 with quad 4/5 RUE 4/5 with bicep 3+/5 6/6: see note: 7/25: see note   Time 12    Period Weeks    Status Partially Met - now discontinued as pt focusing on VRT   Target Date 10/20/22     PT LONG TERM GOAL #4   Title Patient will increase 10 meter walk test to >1.35m/s as to improve gait speed for better community ambulation and to reduce fall risk.    Baseline on  7/22 .57 m/sself selected, fast  .73 m/s with LOB pt reported R knee buckling, elevated rollator used, 8/23: 1.06 m/s with up and go walker, 01/09/21: 0.72 m/s with hiking stick 3/3: 0.74 m/s with walking stick 3/31: 0.81 m/s w walking stick 6/20: 1.1 m/s with walking stick    Time 12    Period Weeks    Status Achieved      PT LONG TERM GOAL #5   Title Patient will increase Berg Balance score by > 6 points to demonstrate decreased fall risk during functional activities.    Baseline 8/23: 37/56, 09/30/20=40/56, 12/20: 42/56 3/3: deferred 3/31 will perform next time; deferred due to throwing up 6/20: 46/56 7/12 38/56    Time 8    Period Weeks    Status Achieved      Additional Long Term Goals   Additional Long Term Goals Yes      PT LONG TERM GOAL #6   Title Patient (< 40 years old) will complete five times sit to stand test in < 10 seconds indicating an increased LE strength and improved balance.    Baseline 8/23: 22 sec, 09/30/20=13.04 sec, 12/20: 27.05 sec, 01/09/21: 18.01s  3/3: 23 seconds no UE support 3/31; deferred due to patient throwing up 7/12: 15.2 seconds 11/15: 26 seconds 1/17: deferred due to pain 3/2: 17.5 seconds 4/20: deferred due to migraine and back pain 6/6: 11 seconds with SUE support 8/22: 13.7 seconds with SUE support; 01/06/23: 12 seconds hands-free   Time 12    Period Weeks    Status Partially Met    Target Date      PT LONG TERM GOAL #7   Title Patient will improve 6 min walk test >1000 feet with LRAD for improved gait ability in community.    Baseline 8/23: not tested; 8/30 845, goal adjusted >2044ft, 09/30/20=600 feet - stopped test due to R ankle catching making gait unsafe, 11/25/20= 330 feet - stopped test due to severe dizziness, vomiting/nausea, sweating making gait unsafe; 01/09/21: 720 ft with no rest breaks 3/3: deferred due to nausea 3/31: deferred due to patient throwing up 6/20: 640 ft 7/12: 629ft with walking stick, no rest breaks; 9/26: 1000 ft    Time  12    Period Weeks    Status Achieved      PT LONG TERM GOAL #8   Title Patient will increase Berg Balance score by > 6 points  (52/56) to demonstrate decreased fall risk during functional activities.    Baseline 6/20: 46/56 7/12: 38/56 with migraine 9/12: defer to next session 11/15:  unable to test due to migraine 1/17: deferred due to pain 3/2: next session 4/20: deferred due to migraine and back pain; 01/06/23: deferred on this date, will require +2 for safety with testing   Time 12    Period Weeks    Status Partially Met    Target Date 10/20/22     PT LONG TERM GOAL  #9   TITLE Patient will report decreased frequency of falling per week to 1x/week for decreased fall risk and improved quality of life    Baseline 12/1: 4 falls minimum a week 1/17: 1 fall last week 3/2: has been bed bound the week prior 4/20: 2-3x/week 6/6: 2x/week 7/25: 2x/week; 11/28: average 3 falls/week; 12/09/22: reports 2 falls since last seen (11/03/2022); 01/06/2023: pt reports averaging 2 falls/week; 02/17/2023: Pt reports average of 2-3x/week    Time 12    Period Weeks    Status MET    Target Date 08/04/22      PT LONG TERM GOAL  #10   TITLE Patient will turn head 75% of the time without dizziness or LOB.    Baseline 6/6: patient falls and has extreme dizziness resulting in throwing up. 7/25: 25% of the time; 11/28: "it happens a lot 65% of the time"; 12/09/22: head turns continue to increase pt's dizziness to 4-5/10; 01/06/23: Pt reports this fluctuates, can turn head 10x some days with VOR before increase in dizziness, other days pt is highly symptomatic with head turns   Time 12    Period Weeks    Status Partially Met   Target Date 10/20/22     PT LONG TERM GOAL  #11   TITLE Patient will report a worst pain of 4/10 on VAS in cervical and lumbar  to improve tolerance with ADLs and reduced symptoms with activities.    Baseline 6/6: lumbar: 10/10, cervical 8/10 7/25: lumbar 6-7/10; cervical 5-6/10    Time 12     Period Weeks    Status Partially Met - now discontinued as pt focusing on VRT   Target Date 10/20/22  Plan     Clinical Impression Statement Pt returns after month trial of performing HEP independently. Pt still with intermittent dizziness that can be severe. He has had one bad fall since last seen with hitting his head and experiencing vertigo. PT reassessed DH today and while no nystagmus seen, pt was symptomatic with L side only. PT provided L Epley 1x. PT provided education to pt and his spouse that pt will need reassessment next visit and that pt may need up to three maneuvers to L side to determine if it is effective. Pt verbalized understanding, agreeable to plan. Pt did tolerate positional testing well today, where previously pt had difficulty completing tests due to symptom severity. This indicates increased tolerance for changes in position. The pt will benefit from further skilled PT to improve positionally-triggered dizziness and balance.    Personal Factors and Comorbidities Comorbidity 3+;Time since onset of injury/illness/exacerbation    Comorbidities anxiety, back pain, COPD, headache, HTN, MI, sleep apnea, migraines    Examination-Activity Limitations Locomotion Level;Transfers;Bed Mobility;Stand;Stairs;Sleep;Squat;Bend    Examination-Participation Restrictions Driving;Medication Management    Stability/Clinical Decision Making Unstable/Unpredictable    Rehab Potential Fair    PT Frequency 1x / week    PT Duration 12 weeks    PT Treatment/Interventions ADLs/Self Care Home Management;Canalith Repostioning;Moist Heat;Electrical Stimulation;Gait training;Stair training;Functional mobility training;Therapeutic activities;Therapeutic exercise;Balance training;Neuromuscular re-education;Patient/family education;Manual techniques;Passive range of motion;Vestibular    PT Next Visit Plan VOR, nustep, manual, optokinetic training, seated EC/balance interventions within  symptom tolerance   PT Home Exercise Plan Medbridge Access Code: [email or web address]    Consulted and Agree with Plan of Care Patient;Family member/caregiver    Family Member Consulted Wife               Temple Pacini PT, DPT  03/31/2023, 4:56 PM

## 2023-04-03 NOTE — Progress Notes (Unsigned)
Date:  04/05/2023   ID:  Cory Espinoza, DOB [birth year], MRN [id number]  Patient Location:  [address] [place] [address]   Provider location:   [facility], [place] office  PCP:  Jerl Mina, MD  Cardiologist:  [facility]  Chief Complaint  Patient presents with   12 month follow up     Cardiac clearance for colonoscopy; scheduled for July 2024. Medications reviewed by the patient verbally.     History of Present Illness:    Cory Espinoza is a 52 y.o. male with past medical history of Smoker, COPD, quit 6 years ago Anxiety Hyperlipidemia [name], cardiac catheterization  04/27/2016 Ost 2nd Mrg lesion, 75% stenosed. Mid Cx lesion, 70% stenosed. HTN OSA, not on CPAP 11/2017: PNA Prior withdrawal from xanax 02/2018 Allergy to asa (swelling in throat/face) Who presents for follow-up of his coronary artery disease, stable angina  Last seen by myself in clinic March 2023  In follow-up today he reports doing relatively well Taking rare NTG Having more periodic chest pain, discomfort into arm pit on left In March 3 days in a row, had symptoms, required nitro  Continues to have falls, to last week, gait instability  No regular exercise program Denies any orthopnea, no PND, no leg swelling  No recent surgeries  Large hand laceration January 2023 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   History of severe migraines,  History of falls, hit head on occasions Back surgery x2 Memory issue, long term , Chronic tremor Continues with physical therapy to restore strength on right side arm and leg, Does not drive  EKG personally reviewed by myself on todays visit Nsr rate 94 no significant ST or T wave changes  CT chest 03/04/2018 CT chest 01/03/2018  cardiac  catheterization  04/27/2016 Lesions initially felt to be 70 or 75%, downgraded after FFR negative FFR of AV groove lesion in OM1 ostial lesion Ost 2nd Mrg lesion, 60% stenosed. Mid Cx lesion, 50% stenosed.  Prior review of lab work shows Total cholesterol 148 LDL 88 Borderline sugars, glucose 100  Past Medical History:  Diagnosis Date   Allergy    Anginal pain (HCC)    Anxiety    Arthritis    lumbar spine   Asthma    Atrial fibrillation (HCC)    Back pain    Severe Lumbar Pain   CHF (congestive heart failure) (HCC)    COPD (chronic obstructive pulmonary disease) (HCC)    Restrictive lung disease   Coronary artery disease    Dyspnea    Dysrhythmia    afib   GERD (gastroesophageal reflux disease)    Headache    [name], onset October 2020, cluster headaches in the past   History of kidney stones    Hyperlipidemia    Hypertension    Myocardial infarction (HCC)    at age 33   Pneumonia    Restrictive lung disease    Sleep apnea    unable to use cpap since onset of migraines   Past Surgical History:  Procedure Laterality Date   ABDOMINAL EXPOSURE N/A 03/25/2020   Procedure: ABDOMINAL EXPOSURE;  Surgeon: Larina Earthly, MD;  Location: [facility] OR;  Service: Vascular;  Laterality: N/A;   ANTERIOR CERVICAL DECOMP/DISCECTOMY FUSION N/A 01/23/2020   Procedure: ANTERIOR CERVICAL DECOMPRESSION  FUSION - CERVICAL SIX-CERVICAL SEVEN;  Surgeon: Julio Sicks, MD;  Location: [facility] OR;  Service: Neurosurgery;  Laterality: N/A;   ANTERIOR LUMBAR FUSION N/A 03/25/2020   Procedure: ANTERIOR LUMBAR INTERBODY FUSION LUMBAR FIVE-SACRAL ONE.;  Surgeon: Julio Sicks, MD;  Location: [facility] OR;  Service: Neurosurgery;  Laterality: N/A;  anterior   BACK SURGERY  1996   CARDIAC CATHETERIZATION Left 04/27/2016   Procedure: Left Heart Cath and Coronary Angiography;  Surgeon: Lamar Blinks, MD;  Location: [facility] INVASIVE CV LAB;  Service: Cardiovascular;  Laterality: Left;   CARDIAC CATHETERIZATION N/A  04/27/2016   Procedure: Intravascular Pressure Wire/FFR Study;  Surgeon: Alwyn Pea, MD;  Location: [facility] INVASIVE CV LAB;  Service: Cardiovascular;  Laterality: N/A;   CATHETERIZATION OF PULMONARY ARTERY WITH RETRIEVAL OF FOREIGN BODY Bilateral 04/07/2011   heart   DEGENERATIVE SPONDYLOLISTHESIS     KNEE ARTHROSCOPY Bilateral 2004   LUMBAR DISC SURGERY  1999   RADICULOPATHY, CERVICAL REGION     TONSILLECTOMY        Allergies:   Aspirin, Amoxicillin, Codeine, Penicillins, Cefdinir, Other, and Hydrocodone-acetaminophen   Social History   Tobacco Use   Smoking status: Former    Packs/day: 2.00    Years: 23.00    Additional pack years: 0.00    Total pack years: 46.00    Types: Cigarettes    Start date: 10/28/1989    Quit date: 10/28/2012    Years since quitting: 10.4   Smokeless tobacco: Current    Types: Snuff   Tobacco comments:    Smokes a Cigar occasionally.   Vaping Use   Vaping Use: Never used  Substance Use Topics   Alcohol use: Yes    Alcohol/week: 0.0 standard drinks of alcohol    Comment: rarely   Drug use: No     Current Outpatient Medications on File Prior to Visit  Medication Sig Dispense Refill   albuterol (PROVENTIL HFA;VENTOLIN HFA) 108 (90 Base) MCG/ACT inhaler Inhale 1-2 puffs into the lungs every 4 (four) hours as needed for wheezing or shortness of breath. 1 Inhaler 5   ALPRAZolam (XANAX) 1 MG tablet Take 1 mg by mouth 3 (three) times daily.     ascorbic acid (VITAMIN C) 1000 MG tablet Take 1,000 mg by mouth daily.     atorvastatin (LIPITOR) 40 MG tablet Take 1 tablet (40 mg total) by mouth at bedtime. 90 tablet 1   B COMPLEX VITAMINS PO Take 1 tablet by mouth daily.      baclofen (LIORESAL) 10 MG tablet Take 10 mg by mouth 2 (two) times daily as needed for muscle spasms.     benzonatate (TESSALON) 200 MG capsule Take 1 capsule (200 mg total) by mouth 3 (three) times daily as needed for cough. 30 capsule 2   calcium carbonate (OS-CAL - DOSED IN MG  OF ELEMENTAL CALCIUM) 1250 (500 Ca) MG tablet Take by mouth.     cetirizine (ZYRTEC) 10 MG tablet TAKE 1 TABLET(10 MG) BY MOUTH DAILY 90 tablet 1   Cholecalciferol (VITAMIN D) 125 MCG (5000 UT) CAPS Take 5,000 Units by mouth daily.     clopidogrel (PLAVIX) 75 MG tablet Take 1 tablet (75 mg total) by mouth daily. 90 tablet 3   DULoxetine (CYMBALTA) 60 MG capsule Take 60 mg by mouth daily.     EMGALITY 120 MG/ML SOAJ SMARTSIG:1 Milliliter(s) SUB-Q Every 4 Weeks     escitalopram (LEXAPRO) 20 MG tablet Take 20 mg by mouth at bedtime.  ezetimibe (ZETIA) 10 MG tablet Take 1 tablet (10 mg total) by mouth daily. No additional refill until completed office visit 75 tablet 0   fluticasone (FLONASE) 50 MCG/ACT nasal spray Place into the nose.     ipratropium-albuterol (DUONEB) 0.5-2.5 (3) MG/3ML SOLN Take 3 mLs by nebulization every 4 (four) hours as needed. DX: J44.9 COPD 360 mL 3   magnesium oxide (MAG-OX) 400 MG tablet Take 400 mg by mouth daily.      Melatonin 10 MG TABS Take 30 mg by mouth at bedtime.     methocarbamol (ROBAXIN) 500 MG tablet Take 500 mg by mouth 2 (two) times daily.     metoprolol succinate (TOPROL-XL) 50 MG 24 hr tablet TAKE 1 TABLET(50 MG) BY MOUTH DAILY WITH OR IMMEDIATELY FOLLOWING A MEAL 90 tablet 0   naratriptan (AMERGE) 2.5 MG tablet TAKE 1 TABLET(2.5 MG) BY MOUTH 1 TIME FOR UP TO 1 DOSE AS NEEDED FOR MIGRAINE. MAY TAKE A SECOND DOSE AFTER 4 HOURS AS NEEDED     nitroGLYCERIN (NITROSTAT) 0.4 MG SL tablet Place 1 tablet (0.4 mg total) under the tongue every 5 (five) minutes as needed for chest pain. 25 tablet 1   nortriptyline (PAMELOR) 25 MG capsule Take 50 mg by mouth at bedtime.     NURTEC 75 MG TBDP Place 75 mg under the tongue daily as needed (migraine).      OXcarbazepine ER (OXTELLAR XR) 600 MG TB24 TAKE 2 TABLETS(1200 MG) BY MOUTH EVERY DAY     pantoprazole (PROTONIX) 40 MG tablet TAKE 1 TABLET(40 MG) BY MOUTH DAILY 30 tablet 10   Potassium 99 MG TABS Take 99 mg by  mouth daily.      pregabalin (LYRICA) 200 MG capsule Take 200 mg by mouth 3 (three) times daily.     promethazine (PHENERGAN) 25 MG tablet Take 25 mg by mouth daily.     pyridOXINE (VITAMIN B-6) 100 MG tablet Take 100 mg by mouth daily.      Saw Palmetto 450 MG CAPS Take 900 mg by mouth daily.      Tiotropium Bromide Monohydrate (SPIRIVA RESPIMAT) 2.5 MCG/ACT AERS Inhale 2 puffs into the lungs daily. 4 g 2   zinc sulfate 220 (50 Zn) MG capsule Take by mouth.     zonisamide (ZONEGRAN) 100 MG capsule Take by mouth.     Probiotic Product (PROBIOTIC DAILY PO) Take 1 tablet by mouth daily. (Patient not taking: Reported on 04/05/2023)     No current facility-administered medications on file prior to visit.    Family Hx: The patient's family history includes Cancer in his paternal grandmother; Diabetes in his paternal grandmother and sister; Heart attack (age of onset: 35) in his paternal grandfather; Heart attack (age of onset: 89) in his father; Heart attack (age of onset: 49) in his brother; Heart disease in his father and mother; Hyperlipidemia in his brother and sister; Hypertension in his brother and sister; Supraventricular tachycardia in his brother.  ROS:   Please see the history of present illness.    Review of Systems  Constitutional: Negative.   HENT: Negative.    Respiratory: Negative.    Cardiovascular:  Positive for chest pain.  Gastrointestinal: Negative.   Musculoskeletal: Negative.   Neurological:  Positive for sensory change, weakness and headaches.       Memory deficits  Psychiatric/Behavioral: Negative.    All other systems reviewed and are negative.    Labs/Other Tests and Data Reviewed:    Recent Labs: No  results found for requested labs within last 365 days.   Recent Lipid Panel Lab Results  Component Value Date/Time   CHOL 140 08/10/2017 09:38 AM   TRIG 65 08/10/2017 09:38 AM   [name] 39 (L) 08/10/2017 09:38 AM   CHOLHDL 3.4 05/01/2016 09:08 AM   LDLCALC 88  08/10/2017 09:38 AM    Wt Readings from Last 3 Encounters:  04/05/23 277 lb 6 oz (125.8 kg)  02/26/22 258 lb (117 kg)  02/07/22 253 lb (114.8 kg)     Exam:    Vital Signs: Vital signs may also be detailed in the HPI BP (!) 130/90 (BP Location: Left Arm, Patient Position: Sitting, Cuff Size: Normal)   Pulse 94   Ht 6' (1.829 m)   Wt 277 lb 6 oz (125.8 kg)   SpO2 93%   BMI 37.62 kg/m  Constitutional:  oriented to person, place, and time. No distress.  HENT:  Head: Grossly normal Eyes:  no discharge. No scleral icterus.  Neck: No JVD, no carotid bruits  Cardiovascular: Regular rate and rhythm, no murmurs appreciated Pulmonary/Chest: Clear to auscultation bilaterally, no wheezes or rails Abdominal: Soft.  no distension.  no tenderness.  Musculoskeletal: Normal range of motion Neurological:  normal muscle tone. Coordination normal. No atrophy Skin: Skin warm and dry Psychiatric: normal affect, pleasant   ASSESSMENT & PLAN:    Problem List Items Addressed This Visit       Cardiology Problems   Hyperlipidemia (Chronic)   [name] (coronary artery disease) - Primary   Benign essential HTN     Other   Chronic obstructive pulmonary disease (HCC)   OSA (obstructive sleep apnea)   Morbid obesity (HCC)   Current tobacco use  Preop cardiovascular evaluation Colonoscopy end of July 24 th 2024 Stress test ordered for preop clearance given recent episodes of angina  [name] with stable angina As above having periodic episodes of chest discomfort, taking sublingual nitro for symptoms We have ordered stress test as above  Migraines/memory loss History of falls, trauma, memory loss, debility Ambulates with a walker Continues to have falls on today's visit, gait instability  HTN Blood pressure is well controlled on today's visit. No changes made to the medications.  Morbid obesity Weight continues to run high Recommend calorie restriction   Total encounter time more than 30  minutes  Greater than 50% was spent in counseling and coordination of care with the patient      Signed, Julien Nordmann, MD  [facility] [address], [place], [place] [address]

## 2023-04-05 ENCOUNTER — Encounter: Payer: Self-pay | Admitting: Cardiovascular Disease

## 2023-04-05 ENCOUNTER — Ambulatory Visit: Payer: 59 | Attending: Cardiovascular Disease | Admitting: Cardiovascular Disease

## 2023-04-05 VITALS — BP 130/90 | HR 94 | Ht 72.0 in | Wt 277.4 lb

## 2023-04-05 DIAGNOSIS — Z720 Tobacco use: Secondary | ICD-10-CM

## 2023-04-05 DIAGNOSIS — I25118 Atherosclerotic heart disease of native coronary artery with other forms of angina pectoris: Secondary | ICD-10-CM

## 2023-04-05 DIAGNOSIS — J432 Centrilobular emphysema: Secondary | ICD-10-CM | POA: Diagnosis not present

## 2023-04-05 DIAGNOSIS — G4733 Obstructive sleep apnea (adult) (pediatric): Secondary | ICD-10-CM | POA: Diagnosis not present

## 2023-04-05 DIAGNOSIS — I10 Essential (primary) hypertension: Secondary | ICD-10-CM | POA: Diagnosis not present

## 2023-04-05 DIAGNOSIS — I2089 Other forms of angina pectoris: Secondary | ICD-10-CM

## 2023-04-05 DIAGNOSIS — R079 Chest pain, unspecified: Secondary | ICD-10-CM

## 2023-04-05 DIAGNOSIS — I209 Angina pectoris, unspecified: Secondary | ICD-10-CM

## 2023-04-05 DIAGNOSIS — E782 Mixed hyperlipidemia: Secondary | ICD-10-CM

## 2023-04-05 MED ORDER — EZETIMIBE 10 MG PO TABS: 10.0000 mg | ORAL_TABLET | Freq: Every day | ORAL | 3 refills | Status: DC

## 2023-04-05 NOTE — Patient Instructions (Addendum)
   Medication Instructions:  No changes  If you need a refill on your cardiac medications before your next appointment, please call your pharmacy.   Lab work: No new labs needed  Testing/Procedures: Your provider has ordered a Lexiscan/ Exercise Myoview Stress test. This will take place at [facility]. Please report to the [facility] medical mall entrance. The volunteers at the first desk will direct you where to go.  [facility] MYOVIEW  Your provider has ordered a Stress Test with nuclear imaging. The purpose of this test is to evaluate the blood supply to your heart muscle. This procedure is referred to as a "Non-Invasive Stress Test." This is because other than having an IV started in your vein, nothing is inserted or "invades" your body. Cardiac stress tests are done to find areas of poor blood flow to the heart by determining the extent of coronary artery disease ([name]). Some patients exercise on a treadmill, which naturally increases the blood flow to your heart, while others who are unable to walk on a treadmill due to physical limitations will have a pharmacologic/chemical stress agent called Lexiscan . This medicine will mimic walking on a treadmill by temporarily increasing your coronary blood flow.   Please note: these test may take anywhere between 2-4 hours to complete  How to prepare for your Myoview test:  Nothing to eat for 6 hours prior to the test No caffeine for 24 hours prior to test No smoking 24 hours prior to test. Your medication may be taken with water.  If your doctor stopped a medication because of this test, do not take that medication.    PLEASE NOTIFY THE OFFICE AT LEAST 24 HOURS IN ADVANCE IF YOU ARE UNABLE TO KEEP YOUR APPOINTMENT.  [phone] AND  PLEASE NOTIFY NUCLEAR MEDICINE AT [facility] AT LEAST 24 HOURS IN ADVANCE IF YOU ARE UNABLE TO KEEP YOUR APPOINTMENT. [phone]   Follow-Up: At [facility], you and your health needs are our priority.  As part of our continuing  mission to provide you with exceptional heart care, we have created designated Provider Care Teams.  These Care Teams include your primary Cardiologist (physician) and Advanced Practice Providers (APPs -  Physician Assistants and Nurse Practitioners) who all work together to provide you with the care you need, when you need it.  You will need a follow up appointment in 6 months  Providers on your designated Care Team:   Nicolasa Ducking, NP Eula Listen, PA-C Cadence Fransico Michael, [place]  COVID-19 Vaccine Information can be found at: [email or web address] For questions related to vaccine distribution or appointments, please email [email or web address] or call [phone].

## 2023-04-06 ENCOUNTER — Ambulatory Visit: Payer: BC Managed Care – PPO

## 2023-04-07 ENCOUNTER — Ambulatory Visit: Payer: 59

## 2023-04-13 ENCOUNTER — Ambulatory Visit: Payer: BC Managed Care – PPO

## 2023-04-14 ENCOUNTER — Ambulatory Visit (INDEPENDENT_AMBULATORY_CARE_PROVIDER_SITE_OTHER): Payer: 59 | Admitting: Internal Medicine

## 2023-04-14 ENCOUNTER — Ambulatory Visit: Payer: 59 | Attending: Neurology

## 2023-04-14 ENCOUNTER — Encounter: Payer: Self-pay | Admitting: Internal Medicine

## 2023-04-14 VITALS — BP 140/80 | HR 110 | Temp 97.7°F | Ht 72.0 in | Wt 280.6 lb

## 2023-04-14 DIAGNOSIS — R262 Difficulty in walking, not elsewhere classified: Secondary | ICD-10-CM | POA: Diagnosis present

## 2023-04-14 DIAGNOSIS — R42 Dizziness and giddiness: Secondary | ICD-10-CM | POA: Insufficient documentation

## 2023-04-14 DIAGNOSIS — M6281 Muscle weakness (generalized): Secondary | ICD-10-CM | POA: Insufficient documentation

## 2023-04-14 DIAGNOSIS — R2681 Unsteadiness on feet: Secondary | ICD-10-CM | POA: Insufficient documentation

## 2023-04-14 DIAGNOSIS — J449 Chronic obstructive pulmonary disease, unspecified: Secondary | ICD-10-CM

## 2023-04-14 DIAGNOSIS — G4733 Obstructive sleep apnea (adult) (pediatric): Secondary | ICD-10-CM | POA: Diagnosis not present

## 2023-04-14 DIAGNOSIS — M542 Cervicalgia: Secondary | ICD-10-CM | POA: Insufficient documentation

## 2023-04-14 NOTE — Progress Notes (Signed)
Name: EARNST COONTS MRN: [id number] DOB: May 21, 1971     CONSULTATION DATE: 04/14/2023  REFERRING MD :  Annye Asa  STUDIES:  CXR 11/18/17 No acute process No opacties   12/2017 EOS levels WNL  PFT 12/2017 Small airways disease FEF 25/75 reduced to 66% predicted Fev1/FVC ratio WNL FVC reduced TLC reduced DLCO NL when corrected for volumes Small airways obstruction with restrictive lung disease from increased weight   CT chest 3.29.19 persistent b/l infiltrates R>L BUT Much improved since last CT scan 12/2017  Patient is a long-term smoker smoked 2-1/2 packs/day for 20 years Patient is a former smoker quit 6 and and half years ago Patient is a Emergency planning/management officer   CC Follow-up OSA  HISTORY OF PRESENT ILLNESS: No respiratory issues at this time Current weight 280   He has very mild intermittent cough and wheezing His exercise tolerance has improved   Previous history of pneumonia Only on Advair and Spiriva  No exacerbation at this time No evidence of heart failure at this time No evidence or signs of infection at this time No respiratory distress No fevers, chills, nausea, vomiting, diarrhea No evidence of lower extremity edema No evidence hemoptysis  Mild obstructive sleep apnea.   Recent sleep study shows mild disease with AHI at 7.6/hour.  With average O2 saturation at 93%.  SPO2 low at 84%. Patient education on sleep apnea and treatment options discussed.  Patient would like to proceed with evaluation for an oral appliance based on last office visit with APP, patient unable to tolerate CPAP at this time due to history of neurological complications from a fall including vertigo and migraines  Patient had a significant change in physical function and mental function over the last 4 years Due to traumatic brain injury patient has frequent falls migraines neuropathy and vertigo  Patient does not smoke cigarettes Currently on Spiriva Respimat which helps  tremendously  At this time I would recommend overnight pulse oximetry to assess for nocturnal hypoxia    SOCIAL HISTORY:  reports that he quit smoking about 10 years ago. His smoking use included cigarettes. He started smoking about 33 years ago. He has a 46.00 pack-year smoking history. His smokeless tobacco use includes snuff. He reports current alcohol use. He reports that he does not use drugs.   BP (!) 140/80 (BP Location: Left Arm, Cuff Size: Normal)   Pulse (!) 110   Temp 97.7 F (36.5 C) (Temporal)   Ht 6' (1.829 m)   Wt 280 lb 9.6 oz (127.3 kg)   SpO2 94%   BMI 38.06 kg/m     Review of Systems: Gen:  Denies  fever, sweats, chills weight loss  HEENT: Denies blurred vision, double vision, ear pain, eye pain, hearing loss, nose bleeds, sore throat Cardiac:  No dizziness, chest pain or heaviness, chest tightness,edema, No JVD Resp:   No cough, -sputum production, +shortness of breath,-wheezing, -hemoptysis,  Other:  All other systems negative   Physical Examination:   General Appearance: No distress  EYES PERRLA, EOM intact.   NECK Supple, No JVD Pulmonary: normal breath sounds, No wheezing.  CardiovascularNormal S1,S2.  No m/r/g.   Abdomen: Benign, Soft, non-tender. Neurology UE/LE 5/5 strength, no focal deficits Ext pulses intact, cap refill intact ALL OTHER ROS ARE NEGATIVE        ASSESSMENT / PLAN:  52 year old white male follow-up for small obstructive airways disease associated with allergic rhinitis in the setting of previous bilateral pneumonia with underlying sleep apnea with  significant physical and cognitive decline over the last 4 years   Reactive airways disease small airways disease obstruction Continue inhaler with Spiriva as prescribed Albuterol as needed Avoid secondhand smoke Avoid SICK contacts Recommend  Masking  when appropriate Recommend Keep up-to-date with vaccinations No indication for antibiotics or steroids at this  time Recommend pulse oximetry overnight to assess for nocturnal hypoxia  Allergic rhinitis Continue Zyrtec 10 mg daily as needed   Obesity -recommend significant weight loss -recommend changing diet  Deconditioned state -Recommend increased daily activity and exercise   OSA Patient being fitted for oral device Patient cannot tolerate CPAP therapy at this time   Preop pulmonary/respiratory exam Pulmonary preop risk assessment.  Patient is a mild to moderate risk.  We went over the potential pulmonary surgical risk.  Major Pulmonary risks identified in the multifactorial risk analysis are but not limited to a) pneumonia; b) recurrent intubation risk; c) prolonged or recurrent acute respiratory failure needing mechanical ventilation; d) prolonged hospitalization; e) DVT/Pulmonary embolism; f) Acute Pulmonary edema  Recommend 1. Short duration of surgery as much as possible and avoid paralytic if possible 2. Recovery in step down or ICU with Pulmonary consultation if indicated.  3. DVT prophylaxis 4. Aggressive pulmonary toilet with o2, bronchodilatation, and incentive spirometry and early ambulation  MEDICATION ADJUSTMENTS/LABS AND TESTS ORDERED: Continue inhalers as prescribed  Obtain Overnight pulse oximetry Recommend weight loss  Avoid secondhand smoke Avoid SICK contacts Recommend  Masking  when appropriate Recommend Keep up-to-date with vaccinations   CURRENT MEDICATIONS REVIEWED AT LENGTH WITH PATIENT TODAY   Patient satisfied with Plan of action and management. All questions answered  Follow up 6 years Total time spent 35 mins   Zeyna Mkrtchyan [name], M.D.  [name]  Medical Director [facility] Medical Director [facility] Cardio-Pulmonary Department

## 2023-04-14 NOTE — Patient Instructions (Addendum)
Continue inhalers as prescribed  Obtain Overnight pulse oximetry Recommend weight loss  Avoid secondhand smoke Avoid SICK contacts Recommend  Masking  when appropriate Recommend Keep up-to-date with vaccinations

## 2023-04-14 NOTE — Therapy (Signed)
OUTPATIENT PHYSICAL THERAPY TREATMENT NOTE   Patient Name: Cory Espinoza MRN: [id number] DOB:1971-10-01, 52 y.o., male Today's Date: 04/14/2023  PCP: Jerl Mina MD REFERRING PROVIDER: Cristopher Peru MD      PT End of Session - 04/14/23 1554     Visit Number [id number]    Number of Visits 124    Date for PT Re-Evaluation 05/12/23    Authorization Type [employer] PPO;    PT Start Time 1601    PT Stop Time 1641    PT Time Calculation (min) 40 min    Equipment Utilized During Treatment Gait belt    Activity Tolerance Other (comment);Patient tolerated treatment well   limited by dizziness   Behavior During Therapy [facility] for tasks assessed/performed                Past Medical History:  Diagnosis Date   Allergy    Anginal pain (HCC)    Anxiety    Arthritis    lumbar spine   Asthma    Atrial fibrillation (HCC)    Back pain    Severe Lumbar Pain   CHF (congestive heart failure) (HCC)    COPD (chronic obstructive pulmonary disease) (HCC)    Restrictive lung disease   Coronary artery disease    Dyspnea    Dysrhythmia    afib   GERD (gastroesophageal reflux disease)    Headache    [name], onset October 2020, cluster headaches in the past   History of kidney stones    Hyperlipidemia    Hypertension    Myocardial infarction (HCC)    at age 50   Pneumonia    Restrictive lung disease    Sleep apnea    unable to use cpap since onset of migraines   Past Surgical History:  Procedure Laterality Date   ABDOMINAL EXPOSURE N/A 03/25/2020   Procedure: ABDOMINAL EXPOSURE;  Surgeon: Larina Earthly, MD;  Location: [facility] OR;  Service: Vascular;  Laterality: N/A;   ANTERIOR CERVICAL DECOMP/DISCECTOMY FUSION N/A 01/23/2020   Procedure: ANTERIOR CERVICAL DECOMPRESSION FUSION - CERVICAL SIX-CERVICAL SEVEN;  Surgeon: Julio Sicks, MD;  Location: [facility] OR;  Service: Neurosurgery;  Laterality: N/A;   ANTERIOR LUMBAR FUSION N/A 03/25/2020   Procedure: ANTERIOR LUMBAR INTERBODY FUSION LUMBAR  FIVE-SACRAL ONE.;  Surgeon: Julio Sicks, MD;  Location: [facility] OR;  Service: Neurosurgery;  Laterality: N/A;  anterior   BACK SURGERY  1996   CARDIAC CATHETERIZATION Left 04/27/2016   Procedure: Left Heart Cath and Coronary Angiography;  Surgeon: Lamar Blinks, MD;  Location: [facility] INVASIVE CV LAB;  Service: Cardiovascular;  Laterality: Left;   CARDIAC CATHETERIZATION N/A 04/27/2016   Procedure: Intravascular Pressure Wire/FFR Study;  Surgeon: Alwyn Pea, MD;  Location: [facility] INVASIVE CV LAB;  Service: Cardiovascular;  Laterality: N/A;   CATHETERIZATION OF PULMONARY ARTERY WITH RETRIEVAL OF FOREIGN BODY Bilateral 04/07/2011   heart   DEGENERATIVE SPONDYLOLISTHESIS     KNEE ARTHROSCOPY Bilateral 2004   LUMBAR DISC SURGERY  1999   RADICULOPATHY, CERVICAL REGION     TONSILLECTOMY     Patient Active Problem List   Diagnosis Date Noted   [name] (coronary artery disease) 10/21/2021   Degenerative spondylolisthesis 03/25/2020   Physical deconditioning 03/04/2020   Lobar pneumonia, unspecified organism (HCC) 03/04/2020   Intractable hemiplegic migraine with status migrainosus 03/02/2020   Current tobacco use 02/05/2020   Abnormal findings on diagnostic imaging of lung 02/05/2020   Shortness of breath 02/05/2020   Herniated disc, cervical 01/23/2020  Cervical spondylosis with myelopathy and radiculopathy 01/23/2020   Pre-operative respiratory examination 01/12/2020   Weakness on right side of face 12/22/2019   Osteoarthritis of spine with radiculopathy, lumbar region 12/11/2019   Intractable migraine with aura with status migrainosus 11/09/2019   Former smoker 08/27/2018   Benign essential HTN 08/27/2018   Cough productive of purulent sputum 11/08/2017   GERD (gastroesophageal reflux disease) 05/06/2017   Contusion of hand 04/22/2017   BP (high blood pressure) 04/22/2017   Oxygen desaturation 04/22/2017   Prostate lump 04/22/2017   Unstable angina (HCC) 04/27/2016   Chest pain 04/24/2016    Morbid obesity (HCC) 01/29/2016   Elevated ALT measurement 10/30/2015   Nonspecific elevation of levels of transaminase and lactic acid dehydrogenase (ldh) 10/30/2015   Reduced libido 10/29/2015   Hyperlipidemia 08/06/2015   Anxiety 08/06/2015   Atherosclerosis of coronary artery 08/06/2015   Childhood asthma 08/06/2015   Chronic obstructive pulmonary disease (HCC) 08/06/2015   OSA (obstructive sleep apnea) 08/06/2015   Anxiety disorder 08/06/2015   Atherosclerosis of native coronary artery of native heart with stable angina pectoris (HCC) 08/06/2015   Uncomplicated asthma 08/06/2015    REFERRING DIAG: BPPV/unsteadiness  THERAPY DIAG:  Dizziness and giddiness  Unsteadiness on feet  Difficulty in walking, not elsewhere classified  Cervicalgia  Muscle weakness (generalized)  SUBJECTIVE: Pt fell into metal kitchen trash at his home and hit his head last Friday. He and his spouse report one other fall since last seen as well. Pt not feeling great today, thinks it is in part due to the weather. He is having migraine symptoms. Pt dizziness unchanged since maneuver  PAIN:  Are you having pain? Yes: NPRS scale: not reported/10 Pain location: head, neck/back Pain description: pain following fall Aggravating factors: turning head, weather Relieving factors: medicine, dark room  Rationale for Evaluation and Treatment Rehabilitation  PERTINENT HISTORY: Pt has a complex medical history. 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 He states that he fell 10-12 feet and struck his head, neck, and back. He was able to finish the course which took him approximately 45 minutes more to complete. He does endorse a second fall while finishing the course but did not suffer a second head injury. He states that he started getting headaches that day which have persisted since that time. He is currently under the care of neurology who is treating him for  hemiplegic migraines and R occipital neuralgia. He does report improvement in his headaches recently. Neurology was concerned about possible BPPV and have referred him for a vestibular evaluation. In addition since the injury, pt developed RUE and RLE pain and weakness. Pt states that NCV showed abnormal nerve conduction in RUE. He has since undergone a C7-7 ACDF on 01/23/20. He reports initial improvement in RUE strength, but states that he has a gradual return of his RUE weakness. He was also having significant RLE weakness and pain and underwent and L5-S1 anterior lumbar interbody fusion on 03/25/20. Patient reports that he does not have any back precautions but needs to wear the low back brace for one more month. Patient reports he has a follow-up appointment with the surgeon next month. Patient reports he has a follow-up appointment with Dr. Clelia Croft, neurologist, in August. He arrives to therapy ambulating with an upright rollator walker. Pt denies any mention of concussion after his injury. He has been having cognitive issues since his injury and has previously had a heavy metals  screen as well as neurocognitive testing. He has had an MRI of his brain with and without contrast on 12/18/19. Results were mildly motion degraded examination. No evidence of acute intracranial abnormality. Minimal chronic small vessel ischemic disease. He has tremor in both his hands. He has a positive family history of Parkinson's disease in his grandfather. He is complaining of constant dizziness and unsteadiness which are worse with activity. Patient states that he frequently loses his balance and his wife has to steady him.   Describe your dizziness without using the term "dizzy:" Pt states he feels unsteady, spinning, nausea,swimmy-headed at times.  He reports spinning can last minutes or longer. Pt is taking meclizine, reports he usually takes prior to PT, including today. Pt has an inhaler with peppermint he uses for nausea. Pt  has vomited before due to vertigo. Vertigo has been on and off since 2020. Pt spouse reports pt fell a couple days ago at night going to the bathroom. Pt hit his head. He states "too many falls in the last six months." Pt cannot always get up when falls to the floor. Pt spouse reports pt had recent seizure that lasted longer than normal (PTSD type seizures). Seizures(?) typically can last from 10 to 24 hours. Yawning frequently is a sign of oncoming seizure for pt. Pt has interrupted sleep. He has neck pain but has been using a massager for his neck and found this to help a bit. Some days his neck pain ranges from 1-2/10, some days 7-8/10. Pt can experience double vision with migraines.Other symptoms include impaired sensation in R leg.  How long does your vertigo last (seconds, hours, >24 hours): Has experienced spinning that lasts over 24 hours  Any other symptoms associated with your dizziness? Unsure if dizzier with migraine symptoms   Do you have trouble with going into busy stores, using screens, or being a passenger in a car and seeing other cars go by?  -issues with being a passenger in a car and watching cars go by  -no issue with screens   -issue with busy stores   What increases likelihood of a migraine? Constantly has a migraine, pt reports the intensity can improve, he has come down to a 2/10 before, can be 5-10/10. Pt getting infusions currently, and thinks they're working. Strong smells can worsen migraine.   What tends to make it more likely you'll have a seizure? Flashing lights, stress and when pt feels angry reports he can have some trouble controlling emotions at times.  PRECAUTIONS: Falls    TODAY'S TREATMENT:    NMR:  Seated VORx1, plain background: several reps of the following Pt cued to perform slow head turns throughout. He completes multiple, brief reps of vertical and horizontal head turns. Cuing to decrease speed until minimal corrective saccades noted with  intervention. Pt baseline dizziness today is 6/10, intervention increases it to 7/10. For recovery intervals pt performs therex (see below)   TE: Seated UT stretch 2x30 sec each side Seated shrugs 2x10  Seated chin tucks 2x10x with 3 sec hold/rep, manual resistance from PT  Cable machine rows: 12.5# 10x 17.5# 10x 22.5 for 2x10  Nustep lvl 4-6 x 5 min total, SPM range 80s. PT cues pt for technique with intervention, cuing for cool-down/decreased speed last minute. PT monitors pt throughout for response to exercise.  Close CGA for mount/dismount.  No dizziness with therex  PT  ambulated with pt from clinic to car, only initial instance of pt decrease in postural  stability when initially standing up   PATIENT EDUCATION: Education details: Pt educated throughout session about proper posture and technique with exercises. Improved exercise technique, movement at target joints, use of target muscles after min to mod verbal, visual, tactile cues.  Person educated: Patient and Spouse Education method: Explanation, verbal cue, demo, tactile cue Education comprehension: verbalized understanding, returned demonstration   HOME EXERCISE PROGRAM:   02/17/23: Access Code: [email or web address] URL: [email or web address] Date: 02/17/2023 Prepared by: Temple Pacini  Exercises - Seated Left Head Turns Vestibular Habituation  - 1 x daily - 5 x weekly - 2 sets - 5-10 reps - Seated Right Head Turns Vestibular Habituation  - 1 x daily - 5 x weekly - 2 sets - 5-10 reps - Seated Head Nods Vestibular Habituation  - 1 x daily - 5 x weekly - 2 sets - 5-10 reps - Vestibular Habituation - Head Tilting  - 1 x daily - 5 x weekly - 2 sets - 5-10 reps - Seated Gaze Stabilization with Head Rotation  - 1 x daily - 7 x weekly - 3 sets - 1 reps - 30-60 seconds hold - Seated Gaze Stabilization with Head Nod  - 1 x daily - 7 x weekly - 3 sets - 1 reps - 30-60 seconds hold  01/06/23:  Driving Optokinetic training  video [email or web address]. Added video to HEP to perform 1x/daily (do not perform if having a high symptom day, however) until symptoms reach 2/10, then rest. Watch video to tolerance. Do not perform this back to back with VOR intervention, allow for sufficient rest until symptoms have decreased back to 0/10.  09/15/2022: Access Code: [email or web address] URL: [email or web address] Date: 09/15/2022 Prepared by: Temple Pacini  Exercises - Seated Gaze Stabilization with Head Rotation  - 1 x daily - 7 x weekly - 3 sets - 2 reps - 30 sec hold    PT Short Term Goals  target date 02/17/2023          PT SHORT TERM GOAL #1   Title Patient will be independent in home exercise program to improve strength/mobility for better functional independence with ADLs and for self-management.    Baseline 3/3 HEP compliant 3/31 hep compliant    Time 6    Period Weeks    Status Achieved    Target Date 02/20/21      PT SHORT TERM GOAL #2   Title Patient will be modified independent in walking on even/uneven surface with least restrictive assistive device, for 10+ minutes without rest break, reporting some difficulty or less to improve walking tolerance with community ambulation including grocery shopping, going to church,etc.    Baseline 02/03: instability, LOB multiple times while ambulating to elevator 3/31: unable to test due to throwing up 6/20: 6 minutes 7/12: unable to test due to migraine 9/12: defer to next session 11/15: unable to test due to migraine 3/2: requires heavy use of AD and seated rest breaks 4/20: walked around yard yesterday    Time 6    Period Weeks    Status Previously partially Met  - now discontinued as pt focusing on VRT   Target Date 03/19/22      PT SHORT TERM GOAL #3   Title Patient will increase ABC scale score >80% to demonstrate better functional mobility and better confidence with ADLs.    Baseline 01/09/21: 60.625% 3/3/: 50% 3/31: 63% 6/20: 45%  7/25: 40% 9/12: 51.1% 11/15: 48% 1/17: 40% 3/2: 33% 4/20: 39% 6/6: 51% 01/06/23: 46.88%   Time  6    Period Weeks    Status Partially Met    Target Date              PT Long Term Goals - target date 01/26/2023        PT LONG TERM GOAL #1   Title Patient will increase FOTO score to equal to or greater than 65 to demonstrate statistically significant improvement in mobility and quality of life.    Baseline scored 41/100 on 05/21/2020; 7/22 40, 8/23: 60/100, 09/30/20=40, 01/09/21: 46 3/3: 40.3% 3/31: 49% 6/20: 45% 7/12: 50.7% 9/12: 44.7% 11/15: 40% 1/17: 40% 3/2: 42%    Time 12    Period Weeks    Status Not Met - now discontinued as pt focusing on VRT rehab   Target Date 10/20/22       PT LONG TERM GOAL #2   Title Patient will reduce falls risk as indicated by decreased TUG time to less than 11 seconds.    Baseline scored 37.9 sec with TUG on 05/21/2020; 21seconds with elevated rollator 7/22, 8/23: 13.62 sec with up and go walker, 01/09/21: 15.81s with hiking stick 3/3: 17.98 seconds one near LOB 3/31: 15.57 seconds with walking stick 6/20: 14.8 seconds with walking stick. 7/25: 19.72 seconds with walking stick. 9/26: 17.6 seconds 11/15: unable to test due to migraine 1/17: deferred due to pain 3/2:19.25 seconds with walking stick in involved hand 4/20: deferred due to migraine and back pain 6/6: 10.5 seconds with walking stick    Time 12    Period Weeks    Status Achieved    Target Date 04/30/22      PT LONG TERM GOAL #3   Title Patient will increase right UE and LE gross strength to 4+/5 throughout to improve functional strength for independent gait, increased standing tolerance and increased ADL ability.    Baseline grossly +3/5 to -4/5 right UE and LE strength on 05/21/2020; grossly 4-/5 for RUE, grossly 4-/5 for RLE on 7/22, grossly 4-/5 RUE, grossly 4/5 RLE 3/3: see note 3/2: see note 4/20: RLE 4-/5 with quad 4/5 RUE 4/5 with bicep 3+/5 6/6: see note: 7/25: see note   Time 12     Period Weeks    Status Partially Met - now discontinued as pt focusing on VRT   Target Date 10/20/22     PT LONG TERM GOAL #4   Title Patient will increase 10 meter walk test to >1.10m/s as to improve gait speed for better community ambulation and to reduce fall risk.    Baseline on 7/22 .57 m/sself selected, fast  .73 m/s with LOB pt reported R knee buckling, elevated rollator used, 8/23: 1.06 m/s with up and go walker, 01/09/21: 0.72 m/s with hiking stick 3/3: 0.74 m/s with walking stick 3/31: 0.81 m/s w walking stick 6/20: 1.1 m/s with walking stick    Time 12    Period Weeks    Status Achieved      PT LONG TERM GOAL #5   Title Patient will increase Berg Balance score by > 6 points to demonstrate decreased fall risk during functional activities.    Baseline 8/23: 37/56, 09/30/20=40/56, 12/20: 42/56 3/3: deferred 3/31 will perform next time; deferred due to throwing up 6/20: 46/56 7/12 38/56    Time 8    Period Weeks    Status Achieved      Additional Long Term Goals   Additional Long Term Goals Yes      PT LONG TERM  GOAL #6   Title Patient (< 36 years old) will complete five times sit to stand test in < 10 seconds indicating an increased LE strength and improved balance.    Baseline 8/23: 22 sec, 09/30/20=13.04 sec, 12/20: 27.05 sec, 01/09/21: 18.01s 3/3: 23 seconds no UE support 3/31; deferred due to patient throwing up 7/12: 15.2 seconds 11/15: 26 seconds 1/17: deferred due to pain 3/2: 17.5 seconds 4/20: deferred due to migraine and back pain 6/6: 11 seconds with SUE support 8/22: 13.7 seconds with SUE support; 01/06/23: 12 seconds hands-free   Time 12    Period Weeks    Status Partially Met    Target Date      PT LONG TERM GOAL #7   Title Patient will improve 6 min walk test >1000 feet with LRAD for improved gait ability in community.    Baseline 8/23: not tested; 8/30 845, goal adjusted >2062ft, 09/30/20=600 feet - stopped test due to R ankle catching making gait unsafe,  11/25/20= 330 feet - stopped test due to severe dizziness, vomiting/nausea, sweating making gait unsafe; 01/09/21: 720 ft with no rest breaks 3/3: deferred due to nausea 3/31: deferred due to patient throwing up 6/20: 640 ft 7/12: 635ft with walking stick, no rest breaks; 9/26: 1000 ft    Time 12    Period Weeks    Status Achieved      PT LONG TERM GOAL #8   Title Patient will increase Berg Balance score by > 6 points  (52/56) to demonstrate decreased fall risk during functional activities.    Baseline 6/20: 46/56 7/12: 38/56 with migraine 9/12: defer to next session 11/15:  unable to test due to migraine 1/17: deferred due to pain 3/2: next session 4/20: deferred due to migraine and back pain; 01/06/23: deferred on this date, will require +2 for safety with testing   Time 12    Period Weeks    Status Partially Met    Target Date 10/20/22     PT LONG TERM GOAL  #9   TITLE Patient will report decreased frequency of falling per week to 1x/week for decreased fall risk and improved quality of life    Baseline 12/1: 4 falls minimum a week 1/17: 1 fall last week 3/2: has been bed bound the week prior 4/20: 2-3x/week 6/6: 2x/week 7/25: 2x/week; 11/28: average 3 falls/week; 12/09/22: reports 2 falls since last seen (11/03/2022); 01/06/2023: pt reports averaging 2 falls/week; 02/17/2023: Pt reports average of 2-3x/week    Time 12    Period Weeks    Status MET    Target Date 08/04/22      PT LONG TERM GOAL  #10   TITLE Patient will turn head 75% of the time without dizziness or LOB.    Baseline 6/6: patient falls and has extreme dizziness resulting in throwing up. 7/25: 25% of the time; 11/28: "it happens a lot 65% of the time"; 12/09/22: head turns continue to increase pt's dizziness to 4-5/10; 01/06/23: Pt reports this fluctuates, can turn head 10x some days with VOR before increase in dizziness, other days pt is highly symptomatic with head turns   Time 12    Period Weeks    Status Partially Met   Target  Date 10/20/22     PT LONG TERM GOAL  #11   TITLE Patient will report a worst pain of 4/10 on VAS in cervical and lumbar  to improve tolerance with ADLs and reduced symptoms with activities.    Baseline  6/6: lumbar: 10/10, cervical 8/10 7/25: lumbar 6-7/10; cervical 5-6/10    Time 12    Period Weeks    Status Partially Met - now discontinued as pt focusing on VRT   Target Date 10/20/22              Plan     Clinical Impression Statement Pt returns to PT reporting 2 falls at home since last seen, with no improvement in dizziness following maneuver.  Today's session focused on VOR training to tolerance and reintroducing pt to therex and cardio-based activities for the dual purpose of vestibular habituation and conditioning. Pt still with significant corrective saccades with VOR, but generally tolerated all interventions fair-good with minimal decrease in postural stability. The pt will benefit from further skilled PT to improve positionally-triggered dizziness and balance.    Personal Factors and Comorbidities Comorbidity 3+;Time since onset of injury/illness/exacerbation    Comorbidities anxiety, back pain, COPD, headache, HTN, MI, sleep apnea, migraines    Examination-Activity Limitations Locomotion Level;Transfers;Bed Mobility;Stand;Stairs;Sleep;Squat;Bend    Examination-Participation Restrictions Driving;Medication Management    Stability/Clinical Decision Making Unstable/Unpredictable    Rehab Potential Fair    PT Frequency 1x / week    PT Duration 12 weeks    PT Treatment/Interventions ADLs/Self Care Home Management;Canalith Repostioning;Moist Heat;Electrical Stimulation;Gait training;Stair training;Functional mobility training;Therapeutic activities;Therapeutic exercise;Balance training;Neuromuscular re-education;Patient/family education;Manual techniques;Passive range of motion;Vestibular    PT Next Visit Plan VOR, nustep, manual, optokinetic training, seated EC/balance  interventions within symptom tolerance   PT Home Exercise Plan Medbridge Access Code: [email or web address]    Consulted and Agree with Plan of Care Patient;Family member/caregiver    Family Member Consulted Wife               Temple Pacini PT, DPT  04/14/2023, 6:01 PM

## 2023-04-16 ENCOUNTER — Encounter
Admission: RE | Admit: 2023-04-16 | Discharge: 2023-04-16 | Disposition: A | Discharge status: Home / Self Care | Payer: 59 | Source: Ambulatory Visit | Attending: Cardiovascular Disease | Admitting: Cardiovascular Disease

## 2023-04-16 DIAGNOSIS — I25118 Atherosclerotic heart disease of native coronary artery with other forms of angina pectoris: Secondary | ICD-10-CM | POA: Diagnosis present

## 2023-04-16 DIAGNOSIS — I2089 Other forms of angina pectoris: Secondary | ICD-10-CM | POA: Insufficient documentation

## 2023-04-16 DIAGNOSIS — R079 Chest pain, unspecified: Secondary | ICD-10-CM | POA: Diagnosis present

## 2023-04-16 LAB — NM MYOCAR MULTI W/SPECT W/WALL MOTION / EF
Exercise duration (sec): 0 s
LV sys vol: 42 mL
MPHR: 170 {beats}/min
Nuc Stress EF: 60 %
Peak HR: 93 {beats}/min
Percent HR: 54 %
Rest Nuclear Isotope Dose: 10.2 mCi
SRS: 4
SSS: 0
Stress Nuclear Isotope Dose: 29 mCi
TID: 1.01

## 2023-04-16 MED ORDER — TECHNETIUM TC 99M TETROFOSMIN IV KIT
10.0000 | PACK | Freq: Once | INTRAVENOUS | Status: AC | PRN
  Administered 2023-04-16: 10.21 via INTRAVENOUS

## 2023-04-16 MED ORDER — TECHNETIUM TC 99M TETROFOSMIN IV KIT
28.9700 | PACK | Freq: Once | INTRAVENOUS | Status: AC | PRN
  Administered 2023-04-16: 28.97 via INTRAVENOUS

## 2023-04-16 MED ORDER — REGADENOSON 0.4 MG/5ML IV SOLN
0.4000 mg | Freq: Once | INTRAVENOUS | Status: AC
  Administered 2023-04-16: 0.4 mg via INTRAVENOUS

## 2023-04-17 LAB — NM MYOCAR MULTI W/SPECT W/WALL MOTION / EF
Estimated workload: 1
Exercise duration (min): 0 min
LV dias vol: 105 mL (ref 62–150)
Rest HR: 73 {beats}/min
SDS: 0
ST Depression (mm): 0 mm

## 2023-04-20 ENCOUNTER — Ambulatory Visit: Payer: BC Managed Care – PPO

## 2023-04-21 ENCOUNTER — Ambulatory Visit: Payer: 59

## 2023-04-27 ENCOUNTER — Ambulatory Visit: Payer: BC Managed Care – PPO

## 2023-04-28 ENCOUNTER — Ambulatory Visit: Payer: 59

## 2023-04-28 DIAGNOSIS — R262 Difficulty in walking, not elsewhere classified: Secondary | ICD-10-CM

## 2023-04-28 DIAGNOSIS — M6281 Muscle weakness (generalized): Secondary | ICD-10-CM

## 2023-04-28 DIAGNOSIS — R42 Dizziness and giddiness: Secondary | ICD-10-CM | POA: Diagnosis not present

## 2023-04-28 DIAGNOSIS — R2681 Unsteadiness on feet: Secondary | ICD-10-CM

## 2023-04-28 NOTE — Therapy (Signed)
OUTPATIENT PHYSICAL THERAPY TREATMENT NOTE   Patient Name: Cory Espinoza MRN: [id number] DOB:[birth year], 52 y.o., male Today's Date: 04/29/2023  PCP: Jerl Mina MD REFERRING PROVIDER: Cristopher Peru MD      PT End of Session - 04/29/23 0859     Visit Number 104    Number of Visits 124    Date for PT Re-Evaluation 05/12/23    Authorization Type [employer] PPO;    PT Start Time 1601    PT Stop Time 1643    PT Time Calculation (min) 42 min    Equipment Utilized During Treatment Gait belt    Activity Tolerance Patient tolerated treatment well    Behavior During Therapy WFL for tasks assessed/performed                 Past Medical History:  Diagnosis Date   Allergy    Anginal pain (HCC)    Anxiety    Arthritis    lumbar spine   Asthma    Atrial fibrillation (HCC)    Back pain    Severe Lumbar Pain   CHF (congestive heart failure) (HCC)    COPD (chronic obstructive pulmonary disease) (HCC)    Restrictive lung disease   Coronary artery disease    Dyspnea    Dysrhythmia    afib   GERD (gastroesophageal reflux disease)    Headache    [name], onset October 2020, cluster headaches in the past   History of kidney stones    Hyperlipidemia    Hypertension    Myocardial infarction (HCC)    at age 74   Pneumonia    Restrictive lung disease    Sleep apnea    unable to use cpap since onset of migraines   Past Surgical History:  Procedure Laterality Date   ABDOMINAL EXPOSURE N/A 03/25/2020   Procedure: ABDOMINAL EXPOSURE;  Surgeon: Larina Earthly, MD;  Location: [facility] OR;  Service: Vascular;  Laterality: N/A;   ANTERIOR CERVICAL DECOMP/DISCECTOMY FUSION N/A 01/23/2020   Procedure: ANTERIOR CERVICAL DECOMPRESSION FUSION - CERVICAL SIX-CERVICAL SEVEN;  Surgeon: Julio Sicks, MD;  Location: [facility] OR;  Service: Neurosurgery;  Laterality: N/A;   ANTERIOR LUMBAR FUSION N/A 03/25/2020   Procedure: ANTERIOR LUMBAR INTERBODY FUSION LUMBAR FIVE-SACRAL ONE.;  Surgeon: Julio Sicks, MD;  Location: [facility] OR;  Service: Neurosurgery;  Laterality: N/A;  anterior   BACK SURGERY  1996   CARDIAC CATHETERIZATION Left 04/27/2016   Procedure: Left Heart Cath and Coronary Angiography;  Surgeon: Lamar Blinks, MD;  Location: [facility] INVASIVE CV LAB;  Service: Cardiovascular;  Laterality: Left;   CARDIAC CATHETERIZATION N/A 04/27/2016   Procedure: Intravascular Pressure Wire/FFR Study;  Surgeon: Alwyn Pea, MD;  Location: [facility] INVASIVE CV LAB;  Service: Cardiovascular;  Laterality: N/A;   CATHETERIZATION OF PULMONARY ARTERY WITH RETRIEVAL OF FOREIGN BODY Bilateral 04/07/2011   heart   DEGENERATIVE SPONDYLOLISTHESIS     KNEE ARTHROSCOPY Bilateral 2004   LUMBAR DISC SURGERY  1999   RADICULOPATHY, CERVICAL REGION     TONSILLECTOMY     Patient Active Problem List   Diagnosis Date Noted   [name] (coronary artery disease) 10/21/2021   Degenerative spondylolisthesis 03/25/2020   Physical deconditioning 03/04/2020   Lobar pneumonia, unspecified organism (HCC) 03/04/2020   Intractable hemiplegic migraine with status migrainosus 03/02/2020   Current tobacco use 02/05/2020   Abnormal findings on diagnostic imaging of lung 02/05/2020   Shortness of breath 02/05/2020   Herniated disc, cervical 01/23/2020   Cervical spondylosis with  myelopathy and radiculopathy 01/23/2020   Pre-operative respiratory examination 01/12/2020   Weakness on right side of face 12/22/2019   Osteoarthritis of spine with radiculopathy, lumbar region 12/11/2019   Intractable migraine with aura with status migrainosus 11/09/2019   Former smoker 08/27/2018   Benign essential HTN 08/27/2018   Cough productive of purulent sputum 11/08/2017   GERD (gastroesophageal reflux disease) 05/06/2017   Contusion of hand 04/22/2017   BP (high blood pressure) 04/22/2017   Oxygen desaturation 04/22/2017   Prostate lump 04/22/2017   Unstable angina (HCC) 04/27/2016   Chest pain 04/24/2016   Morbid obesity (HCC) 01/29/2016    Elevated ALT measurement 10/30/2015   Nonspecific elevation of levels of transaminase and lactic acid dehydrogenase (ldh) 10/30/2015   Reduced libido 10/29/2015   Hyperlipidemia 08/06/2015   Anxiety 08/06/2015   Atherosclerosis of coronary artery 08/06/2015   Childhood asthma 08/06/2015   Chronic obstructive pulmonary disease (HCC) 08/06/2015   OSA (obstructive sleep apnea) 08/06/2015   Anxiety disorder 08/06/2015   Atherosclerosis of native coronary artery of native heart with stable angina pectoris (HCC) 08/06/2015   Uncomplicated asthma 08/06/2015    REFERRING DIAG: BPPV/unsteadiness  THERAPY DIAG:  Dizziness and giddiness  Muscle weakness (generalized)  Unsteadiness on feet  Difficulty in walking, not elsewhere classified  SUBJECTIVE: Pt reports today is a good symptoms day. He reports no recent falls.   PAIN:  Are you having pain? Yes: NPRS scale: not reported/10 Pain location: head, neck/back Pain description: pain following fall Aggravating factors: turning head, weather Relieving factors: medicine, dark room  Rationale for Evaluation and Treatment Rehabilitation  PERTINENT HISTORY: Pt has a complex medical history. 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 He states that he fell 10-12 feet and struck his head, neck, and back. He was able to finish the course which took him approximately 45 minutes more to complete. He does endorse a second fall while finishing the course but did not suffer a second head injury. He states that he started getting headaches that day which have persisted since that time. 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 He does report improvement in his headaches recently. Neurology was concerned about possible BPPV and have referred him for a vestibular evaluation. In addition since the injury, pt developed RUE and RLE pain and  weakness. Pt states that NCV showed abnormal nerve conduction in RUE. He has since undergone a C7-7 ACDF on 01/23/20. He reports initial improvement in RUE strength, but states that he has a gradual return of his RUE weakness. He was also having significant RLE weakness and pain and underwent and L5-S1 anterior lumbar interbody fusion on 03/25/20. Patient reports that he does not have any back precautions but needs to wear the low back brace for one more month. Patient reports he has a follow-up appointment with the surgeon next month. Patient reports he has a follow-up appointment with Dr. Clelia Croft, neurologist, in August. He arrives to therapy ambulating with an upright rollator walker. Pt denies any mention of concussion after his injury. He has been having cognitive issues since his injury and has previously had a heavy metals screen as well as neurocognitive testing. He has had an MRI of his brain with and without contrast on 12/18/19. Results were mildly motion degraded examination. No evidence of acute intracranial abnormality. Minimal chronic small vessel ischemic disease. He has tremor in both  his hands. He has a positive family history of Parkinson's disease in his grandfather. He is complaining of constant dizziness and unsteadiness which are worse with activity. Patient states that he frequently loses his balance and his wife has to steady him.   Describe your dizziness without using the term "dizzy:" Pt states he feels unsteady, spinning, nausea,swimmy-headed at times.  He reports spinning can last minutes or longer. Pt is taking meclizine, reports he usually takes prior to PT, including today. Pt has an inhaler with peppermint he uses for nausea. Pt has vomited before due to vertigo. Vertigo has been on and off since 2020. Pt spouse reports pt fell a couple days ago at night going to the bathroom. Pt hit his head. He states "too many falls in the last six months." Pt cannot always get up when falls to the  floor. Pt spouse reports pt had recent seizure that lasted longer than normal (PTSD type seizures). Seizures(?) typically can last from 10 to 24 hours. Yawning frequently is a sign of oncoming seizure for pt. Pt has interrupted sleep. He has neck pain but has been using a massager for his neck and found this to help a bit. Some days his neck pain ranges from 1-2/10, some days 7-8/10. Pt can experience double vision with migraines.Other symptoms include impaired sensation in R leg.  How long does your vertigo last (seconds, hours, >24 hours): Has experienced spinning that lasts over 24 hours  Any other symptoms associated with your dizziness? Unsure if dizzier with migraine symptoms   Do you have trouble with going into busy stores, using screens, or being a passenger in a car and seeing other cars go by?  -issues with being a passenger in a car and watching cars go by  -no issue with screens   -issue with busy stores   What increases likelihood of a migraine? Constantly has a migraine, pt reports the intensity can improve, he has come down to a 2/10 before, can be 5-10/10. Pt getting infusions currently, and thinks they're working. Strong smells can worsen migraine.   What tends to make it more likely you'll have a seizure? Flashing lights, stress and when pt feels angry reports he can have some trouble controlling emotions at times.  PRECAUTIONS: Falls    TODAY'S TREATMENT:   TE" Nustep interval and progressive intensity training to promote cardioresp and LE mm strength and endurance- LVL 3 x warm-up x 2 min Lvl 5 x 1 min  LVl 6 x 1 min  Lvl 7 x 2 min Lvl 5 x 2 min Lvl 3 x 1 min  PT monitors pt throughout for response to intervention and adjusts intensity as appropriate.  Mild initial decrease in balance upon standing from nustep, PT provided min assist.  Cable machine:  Rows 10-15 reps @ each weight: 17.5#, 22.5#, 27.5#, 32.5#. Pt rates medium Shoulder IR 10x @ 7.5#, 2 sets RUE,  one set LUE  Shoulder ER 9.5# 5x RUE, then 10x 7.5# RUE,   7.5# LUE 10x Core anti-rotation exercise  60 sec each way @ 7.5 rates easy,   PT  ambulated with pt from clinic to elevator, pt exhibits good postural stability with gait and walking stick  PATIENT EDUCATION: Education details: Pt educated throughout session about proper posture and technique with exercises. Improved exercise technique, movement at target joints, use of target muscles after min to mod verbal, visual, tactile cues.  Person educated: Patient and Spouse Education method: Explanation, verbal cue, demo,  tactile cue Education comprehension: verbalized understanding, returned demonstration   HOME EXERCISE PROGRAM:   02/17/23: Access Code: [email or web address] URL: [email or web address] Date: 02/17/2023 Prepared by: Temple Pacini  Exercises - Seated Left Head Turns Vestibular Habituation  - 1 x daily - 5 x weekly - 2 sets - 5-10 reps - Seated Right Head Turns Vestibular Habituation  - 1 x daily - 5 x weekly - 2 sets - 5-10 reps - Seated Head Nods Vestibular Habituation  - 1 x daily - 5 x weekly - 2 sets - 5-10 reps - Vestibular Habituation - Head Tilting  - 1 x daily - 5 x weekly - 2 sets - 5-10 reps - Seated Gaze Stabilization with Head Rotation  - 1 x daily - 7 x weekly - 3 sets - 1 reps - 30-60 seconds hold - Seated Gaze Stabilization with Head Nod  - 1 x daily - 7 x weekly - 3 sets - 1 reps - 30-60 seconds hold  01/06/23:  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 Added video to HEP to perform 1x/daily (do not perform if having a high symptom day, however) until symptoms reach 2/10, then rest. Watch video to tolerance. Do not perform this back to back with VOR intervention, allow for sufficient rest until symptoms have decreased back to 0/10.  09/15/2022: Access Code: [email or web address] URL: [email or web address] Date: 09/15/2022 Prepared by: Temple Pacini  Exercises -  Seated Gaze Stabilization with Head Rotation  - 1 x daily - 7 x weekly - 3 sets - 2 reps - 30 sec hold    PT Short Term Goals  target date 02/17/2023          PT SHORT TERM GOAL #1   Title Patient will be independent in home exercise program to improve strength/mobility for better functional independence with ADLs and for self-management.    Baseline 3/3 HEP compliant 3/31 hep compliant    Time 6    Period Weeks    Status Achieved    Target Date 02/20/21      PT SHORT TERM GOAL #2   Title Patient will be modified independent in walking on even/uneven surface with least restrictive assistive device, for 10+ minutes without rest break, reporting some difficulty or less to improve walking tolerance with community ambulation including grocery shopping, going to church,etc.    Baseline 02/03: instability, LOB multiple times while ambulating to elevator 3/31: unable to test due to throwing up 6/20: 6 minutes 7/12: unable to test due to migraine 9/12: defer to next session 11/15: unable to test due to migraine 3/2: requires heavy use of AD and seated rest breaks 4/20: walked around yard yesterday    Time 6    Period Weeks    Status Previously partially Met  - now discontinued as pt focusing on VRT   Target Date 03/19/22      PT SHORT TERM GOAL #3   Title Patient will increase ABC scale score >80% to demonstrate better functional mobility and better confidence with ADLs.    Baseline 01/09/21: 60.625% 3/3/: 50% 3/31: 63% 6/20: 45% 7/25: 40% 9/12: 51.1% 11/15: 48% 1/17: 40% 3/2: 33% 4/20: 39% 6/6: 51% 01/06/23: 46.88%   Time 6    Period Weeks    Status Partially Met    Target Date              PT Long Term Goals - target date 01/26/2023        PT LONG TERM GOAL #1   Title Patient  will increase FOTO score to equal to or greater than 65 to demonstrate statistically significant improvement in mobility and quality of life.    Baseline scored 41/100 on 05/21/2020; 7/22 40, 8/23: 60/100,  09/30/20=40, 01/09/21: 46 3/3: 40.3% 3/31: 49% 6/20: 45% 7/12: 50.7% 9/12: 44.7% 11/15: 40% 1/17: 40% 3/2: 42%    Time 12    Period Weeks    Status Not Met - now discontinued as pt focusing on VRT rehab   Target Date 10/20/22       PT LONG TERM GOAL #2   Title Patient will reduce falls risk as indicated by decreased TUG time to less than 11 seconds.    Baseline scored 37.9 sec with TUG on 05/21/2020; 21seconds with elevated rollator 7/22, 8/23: 13.62 sec with up and go walker, 01/09/21: 15.81s with hiking stick 3/3: 17.98 seconds one near LOB 3/31: 15.57 seconds with walking stick 6/20: 14.8 seconds with walking stick. 7/25: 19.72 seconds with walking stick. 9/26: 17.6 seconds 11/15: unable to test due to migraine 1/17: deferred due to pain 3/2:19.25 seconds with walking stick in involved hand 4/20: deferred due to migraine and back pain 6/6: 10.5 seconds with walking stick    Time 12    Period Weeks    Status Achieved    Target Date 04/30/22      PT LONG TERM GOAL #3   Title Patient will increase right UE and LE gross strength to 4+/5 throughout to improve functional strength for independent gait, increased standing tolerance and increased ADL ability.    Baseline grossly +3/5 to -4/5 right UE and LE strength on 05/21/2020; grossly 4-/5 for RUE, grossly 4-/5 for RLE on 7/22, grossly 4-/5 RUE, grossly 4/5 RLE 3/3: see note 3/2: see note 4/20: RLE 4-/5 with quad 4/5 RUE 4/5 with bicep 3+/5 6/6: see note: 7/25: see note   Time 12    Period Weeks    Status Partially Met - now discontinued as pt focusing on VRT   Target Date 10/20/22     PT LONG TERM GOAL #4   Title Patient will increase 10 meter walk test to >1.72m/s as to improve gait speed for better community ambulation and to reduce fall risk.    Baseline on 7/22 .57 m/sself selected, fast  .73 m/s with LOB pt reported R knee buckling, elevated rollator used, 8/23: 1.06 m/s with up and go walker, 01/09/21: 0.72 m/s with hiking stick 3/3: 0.74  m/s with walking stick 3/31: 0.81 m/s w walking stick 6/20: 1.1 m/s with walking stick    Time 12    Period Weeks    Status Achieved      PT LONG TERM GOAL #5   Title Patient will increase Berg Balance score by > 6 points to demonstrate decreased fall risk during functional activities.    Baseline 8/23: 37/56, 09/30/20=40/56, 12/20: 42/56 3/3: deferred 3/31 will perform next time; deferred due to throwing up 6/20: 46/56 7/12 38/56    Time 8    Period Weeks    Status Achieved      Additional Long Term Goals   Additional Long Term Goals Yes      PT LONG TERM GOAL #6   Title Patient (< 57 years old) will complete five times sit to stand test in < 10 seconds indicating an increased LE strength and improved balance.    Baseline 8/23: 22 sec, 09/30/20=13.04 sec, 12/20: 27.05 sec, 01/09/21: 18.01s 3/3: 23 seconds no UE support 3/31; deferred due  to patient throwing up 7/12: 15.2 seconds 11/15: 26 seconds 1/17: deferred due to pain 3/2: 17.5 seconds 4/20: deferred due to migraine and back pain 6/6: 11 seconds with SUE support 8/22: 13.7 seconds with SUE support; 01/06/23: 12 seconds hands-free   Time 12    Period Weeks    Status Partially Met    Target Date      PT LONG TERM GOAL #7   Title Patient will improve 6 min walk test >1000 feet with LRAD for improved gait ability in community.    Baseline 8/23: not tested; 8/30 845, goal adjusted >2027ft, 09/30/20=600 feet - stopped test due to R ankle catching making gait unsafe, 11/25/20= 330 feet - stopped test due to severe dizziness, vomiting/nausea, sweating making gait unsafe; 01/09/21: 720 ft with no rest breaks 3/3: deferred due to nausea 3/31: deferred due to patient throwing up 6/20: 640 ft 7/12: 670ft with walking stick, no rest breaks; 9/26: 1000 ft    Time 12    Period Weeks    Status Achieved      PT LONG TERM GOAL #8   Title Patient will increase Berg Balance score by > 6 points  (52/56) to demonstrate decreased fall risk during  functional activities.    Baseline 6/20: 46/56 7/12: 38/56 with migraine 9/12: defer to next session 11/15:  unable to test due to migraine 1/17: deferred due to pain 3/2: next session 4/20: deferred due to migraine and back pain; 01/06/23: deferred on this date, will require +2 for safety with testing   Time 12    Period Weeks    Status Partially Met    Target Date 10/20/22     PT LONG TERM GOAL  #9   TITLE Patient will report decreased frequency of falling per week to 1x/week for decreased fall risk and improved quality of life    Baseline 12/1: 4 falls minimum a week 1/17: 1 fall last week 3/2: has been bed bound the week prior 4/20: 2-3x/week 6/6: 2x/week 7/25: 2x/week; 11/28: average 3 falls/week; 12/09/22: reports 2 falls since last seen (11/03/2022); 01/06/2023: pt reports averaging 2 falls/week; 02/17/2023: Pt reports average of 2-3x/week    Time 12    Period Weeks    Status MET    Target Date 08/04/22      PT LONG TERM GOAL  #10   TITLE Patient will turn head 75% of the time without dizziness or LOB.    Baseline 6/6: patient falls and has extreme dizziness resulting in throwing up. 7/25: 25% of the time; 11/28: "it happens a lot 65% of the time"; 12/09/22: head turns continue to increase pt's dizziness to 4-5/10; 01/06/23: Pt reports this fluctuates, can turn head 10x some days with VOR before increase in dizziness, other days pt is highly symptomatic with head turns   Time 12    Period Weeks    Status Partially Met   Target Date 10/20/22     PT LONG TERM GOAL  #11   TITLE Patient will report a worst pain of 4/10 on VAS in cervical and lumbar  to improve tolerance with ADLs and reduced symptoms with activities.    Baseline 6/6: lumbar: 10/10, cervical 8/10 7/25: lumbar 6-7/10; cervical 5-6/10    Time 12    Period Weeks    Status Partially Met - now discontinued as pt focusing on VRT   Target Date 10/20/22              Plan  Clinical Impression Statement Pt presents to  session reporting low symptom day. Focus of appointment on promoting general conditioning for habituation and improved activity tolerance. Pt performed interval training on nustep and UE exercises to promote cardio, UE/cervical strength and stability. Pt tolerated interventions well with minimal increase in dizziness. The pt will benefit from further skilled PT to improve positionally-triggered dizziness, balance, mobility and activity tolerance.     Personal Factors and Comorbidities Comorbidity 3+;Time since onset of injury/illness/exacerbation    Comorbidities anxiety, back pain, COPD, headache, HTN, MI, sleep apnea, migraines    Examination-Activity Limitations Locomotion Level;Transfers;Bed Mobility;Stand;Stairs;Sleep;Squat;Bend    Examination-Participation Restrictions Driving;Medication Management    Stability/Clinical Decision Making Unstable/Unpredictable    Rehab Potential Fair    PT Frequency 1x / week    PT Duration 12 weeks    PT Treatment/Interventions ADLs/Self Care Home Management;Canalith Repostioning;Moist Heat;Electrical Stimulation;Gait training;Stair training;Functional mobility training;Therapeutic activities;Therapeutic exercise;Balance training;Neuromuscular re-education;Patient/family education;Manual techniques;Passive range of motion;Vestibular    PT Next Visit Plan VOR, nustep, manual, optokinetic training, seated EC/balance interventions within symptom tolerance   PT Home Exercise Plan Medbridge Access Code: [email or web address]    Consulted and Agree with Plan of Care Patient;Family member/caregiver    Family Member Consulted Wife               Temple Pacini PT, DPT  04/29/2023, 9:02 AM

## 2023-04-29 ENCOUNTER — Telehealth: Payer: Self-pay | Admitting: Internal Medicine

## 2023-04-29 ENCOUNTER — Telehealth: Payer: Self-pay

## 2023-04-29 DIAGNOSIS — G4734 Idiopathic sleep related nonobstructive alveolar hypoventilation: Secondary | ICD-10-CM

## 2023-04-29 DIAGNOSIS — J449 Chronic obstructive pulmonary disease, unspecified: Secondary | ICD-10-CM

## 2023-04-29 NOTE — Telephone Encounter (Signed)
I spoke with the patient's wife. I told her he will be using the O2 at night on continuous flow O2. She is also wanting him to have a humidifier for his O2 concentrator so his nose will not get dried out.  Dr. Belia Heman, [facility] to order humidifier?

## 2023-04-29 NOTE — Telephone Encounter (Signed)
ONO reviewed by Dr. Belia Heman- recommend 2L QHS.  Low spo2 83%. Patient is aware of results and voiced his understanding. He agrees with plan.  Order placed. Nothing further needed.

## 2023-04-29 NOTE — Telephone Encounter (Signed)
Order has been placed and I notified the patient's wife (DPR).  Nothing further needed.

## 2023-04-29 NOTE — Telephone Encounter (Signed)
Patients wife called states that oxygen tanks were dropped off and were told that patient needs oxygen continuous- patient's wife would like clarification on what that means (does he need O2 only at night or all the time). Patient is also requesting a nasal piece so his nose does not dry out. Please advise.

## 2023-05-04 ENCOUNTER — Ambulatory Visit: Payer: BC Managed Care – PPO

## 2023-05-05 ENCOUNTER — Ambulatory Visit: Payer: 59

## 2023-05-11 ENCOUNTER — Ambulatory Visit: Payer: BC Managed Care – PPO

## 2023-05-12 ENCOUNTER — Ambulatory Visit: Payer: 59

## 2023-05-13 ENCOUNTER — Encounter: Payer: Self-pay | Admitting: Internal Medicine

## 2023-05-15 ENCOUNTER — Other Ambulatory Visit: Payer: Self-pay | Admitting: Cardiovascular Disease

## 2023-05-18 ENCOUNTER — Ambulatory Visit: Payer: BC Managed Care – PPO

## 2023-05-18 ENCOUNTER — Ambulatory Visit: Payer: 59

## 2023-05-19 ENCOUNTER — Ambulatory Visit: Payer: 59

## 2023-05-25 ENCOUNTER — Ambulatory Visit: Payer: BC Managed Care – PPO

## 2023-05-26 ENCOUNTER — Ambulatory Visit: Payer: 59 | Attending: Neurology

## 2023-05-26 DIAGNOSIS — R2681 Unsteadiness on feet: Secondary | ICD-10-CM | POA: Insufficient documentation

## 2023-05-26 DIAGNOSIS — M6281 Muscle weakness (generalized): Secondary | ICD-10-CM | POA: Insufficient documentation

## 2023-05-26 DIAGNOSIS — R42 Dizziness and giddiness: Secondary | ICD-10-CM | POA: Diagnosis present

## 2023-05-26 NOTE — Therapy (Unsigned)
OUTPATIENT PHYSICAL THERAPY TREATMENT NOTE/RECERT   Patient Name: Cory Espinoza MRN: [id number] DOB:October 22, 1971, 52 y.o., male Today's Date: 05/27/2023  PCP: Jerl Mina MD REFERRING PROVIDER: Cristopher Peru MD      PT End of Session - 05/27/23 1455     Visit Number [id number]    Number of Visits 121    Date for PT Re-Evaluation 07/21/23    Authorization Type [employer] PPO;    PT Start Time 1605    PT Stop Time 1645    PT Time Calculation (min) 40 min    Equipment Utilized During Treatment Gait belt    Activity Tolerance Patient tolerated treatment well    Behavior During Therapy WFL for tasks assessed/performed                 Past Medical History:  Diagnosis Date   Allergy    Anginal pain (HCC)    Anxiety    Arthritis    lumbar spine   Asthma    Atrial fibrillation (HCC)    Back pain    Severe Lumbar Pain   CHF (congestive heart failure) (HCC)    COPD (chronic obstructive pulmonary disease) (HCC)    Restrictive lung disease   Coronary artery disease    Dyspnea    Dysrhythmia    afib   GERD (gastroesophageal reflux disease)    Headache    [name], onset October 2020, cluster headaches in the past   History of kidney stones    Hyperlipidemia    Hypertension    Myocardial infarction (HCC)    at age 54   Pneumonia    Restrictive lung disease    Sleep apnea    unable to use cpap since onset of migraines   Past Surgical History:  Procedure Laterality Date   ABDOMINAL EXPOSURE N/A 03/25/2020   Procedure: ABDOMINAL EXPOSURE;  Surgeon: Larina Earthly, MD;  Location: [facility] OR;  Service: Vascular;  Laterality: N/A;   ANTERIOR CERVICAL DECOMP/DISCECTOMY FUSION N/A 01/23/2020   Procedure: ANTERIOR CERVICAL DECOMPRESSION FUSION - CERVICAL SIX-CERVICAL SEVEN;  Surgeon: Julio Sicks, MD;  Location: [facility] OR;  Service: Neurosurgery;  Laterality: N/A;   ANTERIOR LUMBAR FUSION N/A 03/25/2020   Procedure: ANTERIOR LUMBAR INTERBODY FUSION LUMBAR FIVE-SACRAL ONE.;  Surgeon:  Julio Sicks, MD;  Location: [facility] OR;  Service: Neurosurgery;  Laterality: N/A;  anterior   BACK SURGERY  1996   CARDIAC CATHETERIZATION Left 04/27/2016   Procedure: Left Heart Cath and Coronary Angiography;  Surgeon: Lamar Blinks, MD;  Location: [facility] INVASIVE CV LAB;  Service: Cardiovascular;  Laterality: Left;   CARDIAC CATHETERIZATION N/A 04/27/2016   Procedure: Intravascular Pressure Wire/FFR Study;  Surgeon: Alwyn Pea, MD;  Location: [facility] INVASIVE CV LAB;  Service: Cardiovascular;  Laterality: N/A;   CATHETERIZATION OF PULMONARY ARTERY WITH RETRIEVAL OF FOREIGN BODY Bilateral 04/07/2011   heart   DEGENERATIVE SPONDYLOLISTHESIS     KNEE ARTHROSCOPY Bilateral 2004   LUMBAR DISC SURGERY  1999   RADICULOPATHY, CERVICAL REGION     TONSILLECTOMY     Patient Active Problem List   Diagnosis Date Noted   [name] (coronary artery disease) 10/21/2021   Degenerative spondylolisthesis 03/25/2020   Physical deconditioning 03/04/2020   Lobar pneumonia, unspecified organism (HCC) 03/04/2020   Intractable hemiplegic migraine with status migrainosus 03/02/2020   Current tobacco use 02/05/2020   Abnormal findings on diagnostic imaging of lung 02/05/2020   Shortness of breath 02/05/2020   Herniated disc, cervical 01/23/2020   Cervical spondylosis with  myelopathy and radiculopathy 01/23/2020   Pre-operative respiratory examination 01/12/2020   Weakness on right side of face 12/22/2019   Osteoarthritis of spine with radiculopathy, lumbar region 12/11/2019   Intractable migraine with aura with status migrainosus 11/09/2019   Former smoker 08/27/2018   Benign essential HTN 08/27/2018   Cough productive of purulent sputum 11/08/2017   GERD (gastroesophageal reflux disease) 05/06/2017   Contusion of hand 04/22/2017   BP (high blood pressure) 04/22/2017   Oxygen desaturation 04/22/2017   Prostate lump 04/22/2017   Unstable angina (HCC) 04/27/2016   Chest pain 04/24/2016   Morbid obesity (HCC)  01/29/2016   Elevated ALT measurement 10/30/2015   Nonspecific elevation of levels of transaminase and lactic acid dehydrogenase (ldh) 10/30/2015   Reduced libido 10/29/2015   Hyperlipidemia 08/06/2015   Anxiety 08/06/2015   Atherosclerosis of coronary artery 08/06/2015   Childhood asthma 08/06/2015   Chronic obstructive pulmonary disease (HCC) 08/06/2015   OSA (obstructive sleep apnea) 08/06/2015   Anxiety disorder 08/06/2015   Atherosclerosis of native coronary artery of native heart with stable angina pectoris (HCC) 08/06/2015   Uncomplicated asthma 08/06/2015    REFERRING DIAG: BPPV/unsteadiness  THERAPY DIAG:  Dizziness and giddiness  Muscle weakness (generalized)  Unsteadiness on feet  SUBJECTIVE: Pt reports he felt fine following last visit, but was a little sore. Pt spouse reports three falls since last seen. One fall occurred in a hallway, one by their pool and one fall last night where he hit his head. He rates headache about 1-2/10 today. Pt has delt with some lightheadedness since  coming down with a virus while on his trip.  PAIN:  Are you having pain? Yes: NPRS scale: not reported/10 Pain location: head, neck/back Pain description: pain following fall Aggravating factors: turning head, weather Relieving factors: medicine, dark room  Rationale for Evaluation and Treatment Rehabilitation  PERTINENT HISTORY: Pt has a complex medical history. 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 He states that he fell 10-12 feet and struck his head, neck, and back. He was able to finish the course which took him approximately 45 minutes more to complete. He does endorse a second fall while finishing the course but did not suffer a second head injury. He states that he started getting headaches that day which have persisted since that time. He is currently under the care of neurology who is treating him for hemiplegic migraines and R occipital  neuralgia. He does report improvement in his headaches recently. Neurology was concerned about possible BPPV and have referred him for a vestibular evaluation. In addition since the injury, pt developed RUE and RLE pain and weakness. Pt states that NCV showed abnormal nerve conduction in RUE. He has since undergone a C7-7 ACDF on 01/23/20. He reports initial improvement in RUE strength, but states that he has a gradual return of his RUE weakness. He was also having significant RLE weakness and pain and underwent and L5-S1 anterior lumbar interbody fusion on 03/25/20. Patient reports that he does not have any back precautions but needs to wear the low back brace for one more month. Patient reports he has a follow-up appointment with the surgeon next month. Patient reports he has a follow-up appointment with Dr. Clelia Croft, neurologist, in August. He arrives to therapy ambulating with an upright rollator walker. Pt denies any mention of concussion after his injury. He has been having cognitive issues since his injury and has previously had a heavy metals  screen as well as neurocognitive testing. He has had an MRI of his brain with and without contrast on 12/18/19. Results were mildly motion degraded examination. No evidence of acute intracranial abnormality. Minimal chronic small vessel ischemic disease. He has tremor in both his hands. He has a positive family history of Parkinson's disease in his grandfather. He is complaining of constant dizziness and unsteadiness which are worse with activity. Patient states that he frequently loses his balance and his wife has to steady him.   Describe your dizziness without using the term "dizzy:" Pt states he feels unsteady, spinning, nausea,swimmy-headed at times.  He reports spinning can last minutes or longer. Pt is taking meclizine, reports he usually takes prior to PT, including today. Pt has an inhaler with peppermint he uses for nausea. Pt has vomited before due to vertigo.  Vertigo has been on and off since 2020. Pt spouse reports pt fell a couple days ago at night going to the bathroom. Pt hit his head. He states "too many falls in the last six months." Pt cannot always get up when falls to the floor. Pt spouse reports pt had recent seizure that lasted longer than normal (PTSD type seizures). Seizures(?) typically can last from 10 to 24 hours. Yawning frequently is a sign of oncoming seizure for pt. Pt has interrupted sleep. He has neck pain but has been using a massager for his neck and found this to help a bit. Some days his neck pain ranges from 1-2/10, some days 7-8/10. Pt can experience double vision with migraines.Other symptoms include impaired sensation in R leg.  How long does your vertigo last (seconds, hours, >24 hours): Has experienced spinning that lasts over 24 hours  Any other symptoms associated with your dizziness? Unsure if dizzier with migraine symptoms   Do you have trouble with going into busy stores, using screens, or being a passenger in a car and seeing other cars go by?  -issues with being a passenger in a car and watching cars go by  -no issue with screens   -issue with busy stores   What increases likelihood of a migraine? Constantly has a migraine, pt reports the intensity can improve, he has come down to a 2/10 before, can be 5-10/10. Pt getting infusions currently, and thinks they're working. Strong smells can worsen migraine.   What tends to make it more likely you'll have a seizure? Flashing lights, stress and when pt feels angry reports he can have some trouble controlling emotions at times.  PRECAUTIONS: Falls    TODAY'S TREATMENT:   TA: Goals reassessed for recert. Please refer to goal section below for details.  TE:   Vitals HR 86, SPO2 96%   Nustep interval and progressive intensity training to promote cardioresp and LE mm strength and endurance- LVL 1 x 2 min. SPO2% 94% Lvl 2 x 1 min  LVl 3 x 2 min  Lvl 5 x 1 min -  Pt rates medium Lvl 4 x 1 min Lvl 2 x 1 min  SPO2 96% PT monitors pt throughout for response to intervention and adjusts intensity as appropriate.  PT provides min assist for dismount due to mild instability with standing. SPO2% monitored as pt reports he has found SPO2% drops in his sleep (physician aware, being treated for issue).  Cable machine: UE focus Rows 10 reps @ each weight: 12.5#, 22.5#, 32.5# (2 rounds at this weight). Pt rates medium  Shoulder ER 2.5# 10x each UE  BP assessment: Seated:  134/89 mmHg HR 90  Standing: 118/87 mmHg HR 92  Instructed pt in possible indications of BP drop on symptoms/dizziness. Plan to reassess future visit as this could be contributing to pt's falls/fall risk.   PT  ambulated with pt from clinic to elevator, pt exhibits good postural stability with gait and walking stick    PATIENT EDUCATION: Education details: Pt educated throughout session about proper posture and technique with exercises. Improved exercise technique, movement at target joints, use of target muscles after min to mod verbal, visual, tactile cues. Indications of goal performance  Person educated: Patient and Spouse Education method: Explanation, verbal cue, demo, tactile cue Education comprehension: verbalized understanding, returned demonstration   HOME EXERCISE PROGRAM:   02/17/23: Access Code: [email or web address] URL: [email or web address] Date: 02/17/2023 Prepared by: Temple Pacini  Exercises - Seated Left Head Turns Vestibular Habituation  - 1 x daily - 5 x weekly - 2 sets - 5-10 reps - Seated Right Head Turns Vestibular Habituation  - 1 x daily - 5 x weekly - 2 sets - 5-10 reps - Seated Head Nods Vestibular Habituation  - 1 x daily - 5 x weekly - 2 sets - 5-10 reps - Vestibular Habituation - Head Tilting  - 1 x daily - 5 x weekly - 2 sets - 5-10 reps - Seated Gaze Stabilization with Head Rotation  - 1 x daily - 7 x weekly - 3 sets - 1 reps - 30-60 seconds hold -  Seated Gaze Stabilization with Head Nod  - 1 x daily - 7 x weekly - 3 sets - 1 reps - 30-60 seconds hold  01/06/23:  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 Added video to HEP to perform 1x/daily (do not perform if having a high symptom day, however) until symptoms reach 2/10, then rest. Watch video to tolerance. Do not perform this back to back with VOR intervention, allow for sufficient rest until symptoms have decreased back to 0/10.  09/15/2022: Access Code: [email or web address] URL: [email or web address] Date: 09/15/2022 Prepared by: Temple Pacini  Exercises - Seated Gaze Stabilization with Head Rotation  - 1 x daily - 7 x weekly - 3 sets - 2 reps - 30 sec hold    PT Short Term Goals  target date 02/17/2023 new target date 06/23/2023            PT SHORT TERM GOAL #1   Title Patient will be independent in home exercise program to improve strength/mobility for better functional independence with ADLs and for self-management.    Baseline 3/3 HEP compliant 3/31 hep compliant    Time 6    Period Weeks    Status Achieved    Target Date 02/20/21      PT SHORT TERM GOAL #2   Title Patient will be modified independent in walking on even/uneven surface with least restrictive assistive device, for 10+ minutes without rest break, reporting some difficulty or less to improve walking tolerance with community ambulation including grocery shopping, going to church,etc.    Baseline 02/03: instability, LOB multiple times while ambulating to elevator 3/31: unable to test due to throwing up 6/20: 6 minutes 7/12: unable to test due to migraine 9/12: defer to next session 11/15: unable to test due to migraine 3/2: requires heavy use of AD and seated rest breaks 4/20: walked around yard yesterday    Time 6    Period Weeks    Status Previously partially Met  - now discontinued as pt focusing on VRT   Target Date 03/19/22  PT SHORT TERM GOAL #3   Title  Patient will increase ABC scale score >80% to demonstrate better functional mobility and better confidence with ADLs.    Baseline 01/09/21: 60.625% 3/3/: 50% 3/31: 63% 6/20: 45% 7/25: 40% 9/12: 51.1% 11/15: 48% 1/17: 40% 3/2: 33% 4/20: 39% 6/6: 51% 01/06/23: 46.88%   Time 6    Period Weeks    Status Partially Met    Target Date              PT Long Term Goals - target date 01/26/2023 new target date 07/21/2023            PT LONG TERM GOAL #1   Title Patient will increase FOTO score to equal to or greater than 65 to demonstrate statistically significant improvement in mobility and quality of life.    Baseline scored 41/100 on 05/21/2020; 7/22 40, 8/23: 60/100, 09/30/20=40, 01/09/21: 46 3/3: 40.3% 3/31: 49% 6/20: 45% 7/12: 50.7% 9/12: 44.7% 11/15: 40% 1/17: 40% 3/2: 42%    Time 12    Period Weeks    Status Not Met - now discontinued as pt focusing on VRT rehab   Target Date 10/20/22       PT LONG TERM GOAL #2   Title Patient will reduce falls risk as indicated by decreased TUG time to less than 11 seconds.    Baseline scored 37.9 sec with TUG on 05/21/2020; 21seconds with elevated rollator 7/22, 8/23: 13.62 sec with up and go walker, 01/09/21: 15.81s with hiking stick 3/3: 17.98 seconds one near LOB 3/31: 15.57 seconds with walking stick 6/20: 14.8 seconds with walking stick. 7/25: 19.72 seconds with walking stick. 9/26: 17.6 seconds 11/15: unable to test due to migraine 1/17: deferred due to pain 3/2:19.25 seconds with walking stick in involved hand 4/20: deferred due to migraine and back pain 6/6: 10.5 seconds with walking stick    Time 12    Period Weeks    Status Achieved    Target Date 04/30/22      PT LONG TERM GOAL #3   Title Patient will increase right UE and LE gross strength to 4+/5 throughout to improve functional strength for independent gait, increased standing tolerance and increased ADL ability.    Baseline grossly +3/5 to -4/5 right UE and LE strength on 05/21/2020;  grossly 4-/5 for RUE, grossly 4-/5 for RLE on 7/22, grossly 4-/5 RUE, grossly 4/5 RLE 3/3: see note 3/2: see note 4/20: RLE 4-/5 with quad 4/5 RUE 4/5 with bicep 3+/5 6/6: see note: 7/25: see note   Time 12    Period Weeks    Status Partially Met - now discontinued as pt focusing on VRT   Target Date 10/20/22     PT LONG TERM GOAL #4   Title Patient will increase 10 meter walk test to >1.40m/s as to improve gait speed for better community ambulation and to reduce fall risk.    Baseline on 7/22 .57 m/sself selected, fast  .73 m/s with LOB pt reported R knee buckling, elevated rollator used, 8/23: 1.06 m/s with up and go walker, 01/09/21: 0.72 m/s with hiking stick 3/3: 0.74 m/s with walking stick 3/31: 0.81 m/s w walking stick 6/20: 1.1 m/s with walking stick    Time 12    Period Weeks    Status Achieved      PT LONG TERM GOAL #5   Title Patient will increase Berg Balance score by > 6 points to demonstrate decreased fall risk during functional  activities.    Baseline 8/23: 37/56, 09/30/20=40/56, 12/20: 42/56 3/3: deferred 3/31 will perform next time; deferred due to throwing up 6/20: 46/56 7/12 38/56    Time 8    Period Weeks    Status Achieved      Additional Long Term Goals   Additional Long Term Goals Yes      PT LONG TERM GOAL #6   Title Patient (< 28 years old) will complete five times sit to stand test in < 10 seconds indicating an increased LE strength and improved balance.    Baseline 8/23: 22 sec, 09/30/20=13.04 sec, 12/20: 27.05 sec, 01/09/21: 18.01s 3/3: 23 seconds no UE support 3/31; deferred due to patient throwing up 7/12: 15.2 seconds 11/15: 26 seconds 1/17: deferred due to pain 3/2: 17.5 seconds 4/20: deferred due to migraine and back pain 6/6: 11 seconds with SUE support 8/22: 13.7 seconds with SUE support; 01/06/23: 12 seconds hands-free; 05/26/23 13 seconds   Time 12    Period Weeks    Status Partially Met    Target Date      PT LONG TERM GOAL #7   Title Patient will  improve 6 min walk test >1000 feet with LRAD for improved gait ability in community.    Baseline 8/23: not tested; 8/30 845, goal adjusted >2028ft, 09/30/20=600 feet - stopped test due to R ankle catching making gait unsafe, 11/25/20= 330 feet - stopped test due to severe dizziness, vomiting/nausea, sweating making gait unsafe; 01/09/21: 720 ft with no rest breaks 3/3: deferred due to nausea 3/31: deferred due to patient throwing up 6/20: 640 ft 7/12: 676ft with walking stick, no rest breaks; 9/26: 1000 ft    Time 12    Period Weeks    Status Achieved      PT LONG TERM GOAL #8   Title Patient will increase Berg Balance score by > 6 points  (52/56) to demonstrate decreased fall risk during functional activities.    Baseline 6/20: 46/56 7/12: 38/56 with migraine 9/12: defer to next session 11/15:  unable to test due to migraine 1/17: deferred due to pain 3/2: next session 4/20: deferred due to migraine and back pain; 01/06/23: deferred on this date, will require +2 for safety with testing; 05/26/23: previously partially met, no longer area of focus/discontinued    Time 12    Period Weeks    Status Partially Met    Target Date 10/20/22     PT LONG TERM GOAL  #9   TITLE Patient will report decreased frequency of falling per week to 1x/week for decreased fall risk and improved quality of life    Baseline 12/1: 4 falls minimum a week 1/17: 1 fall last week 3/2: has been bed bound the week prior 4/20: 2-3x/week 6/6: 2x/week 7/25: 2x/week; 11/28: average 3 falls/week; 12/09/22: reports 2 falls since last seen (11/03/2022); 01/06/2023: pt reports averaging 2 falls/week; 02/17/2023: Pt reports average of 2-3x/week ; 05/26/23: 1-2 falls/week    Time 12    Period Weeks    Status MET    Target Date 08/04/22      PT LONG TERM GOAL  #10   TITLE Patient will turn head 75% of the time without dizziness or LOB.    Baseline 6/6: patient falls and has extreme dizziness resulting in throwing up. 7/25: 25% of the time;  11/28: "it happens a lot 65% of the time"; 12/09/22: head turns continue to increase pt's dizziness to 4-5/10; 01/06/23: Pt reports this fluctuates, can  turn head 10x some days with VOR before increase in dizziness, other days pt is highly symptomatic with head turns; 05/26/23: pt reports this fluctuates can get up to 10 head turns some days, some days only 2-3/10   Time 12    Period Weeks    Status Partially Met   Target Date 10/20/22     PT LONG TERM GOAL  #11   TITLE Patient will report a worst pain of 4/10 on VAS in cervical and lumbar  to improve tolerance with ADLs and reduced symptoms with activities.    Baseline 6/6: lumbar: 10/10, cervical 8/10 7/25: lumbar 6-7/10; cervical 5-6/10; 05/26/23: pt reports back pain has been up to a 10   Time 12    Period Weeks    Status Partially Met - now discontinued as pt focusing on VRT   Target Date 10/20/22              Plan     Clinical Impression Statement Goal reassessment completed for recert. Plan shifting to more compensation, conditioning and strengthening focus to promote activities that will challenge/strengthen the vestibular system and improve ADL performance. Pt with similar 5xSTS performance compared to previous assessment, indicating pt still with increased fall risk. This is also reflected by recent falls reported to PT. While pt still a fall risk, he reports average falls have decreased from 2-3x/week to 1-2x/week. Pt still with similar reports of difficulty and dizziness with head turns. Limited attendance to PT likely impacting progress. Patient's condition has the potential to improve in response to therapy. Maximum improvement is yet to be obtained. The anticipated improvement is attainable and reasonable in a generally predictable time.   The  pt will benefit from further skilled PT to improve positionally-triggered dizziness, balance, mobility and activity tolerance.     Personal Factors and Comorbidities Comorbidity 3+;Time  since onset of injury/illness/exacerbation    Comorbidities anxiety, back pain, COPD, headache, HTN, MI, sleep apnea, migraines    Examination-Activity Limitations Locomotion Level;Transfers;Bed Mobility;Stand;Stairs;Sleep;Squat;Bend    Examination-Participation Restrictions Driving;Medication Management    Stability/Clinical Decision Making Unstable/Unpredictable    Rehab Potential Fair    PT Frequency 1x / week    PT Duration 8 weeks    PT Treatment/Interventions ADLs/Self Care Home Management;Canalith Repostioning;Moist Heat;Electrical Stimulation;Gait training;Stair training;Functional mobility training;Therapeutic activities;Therapeutic exercise;Balance training;Neuromuscular re-education;Patient/family education;Manual techniques;Passive range of motion;Vestibular    PT Next Visit Plan VOR, nustep, manual, optokinetic training, seated EC/balance interventions within symptom tolerance   PT Home Exercise Plan Medbridge Access Code: [email or web address]    Consulted and Agree with Plan of Care Patient;Family member/caregiver    Family Member Consulted Wife               Temple Pacini PT, DPT  05/27/2023, 3:06 PM

## 2023-05-27 NOTE — Addendum Note (Signed)
Addended by: Baird Kay on: 05/27/2023 03:08 PM   Modules accepted: Orders

## 2023-06-01 ENCOUNTER — Ambulatory Visit: Payer: BC Managed Care – PPO

## 2023-06-02 ENCOUNTER — Ambulatory Visit: Payer: 59

## 2023-06-08 ENCOUNTER — Ambulatory Visit: Payer: BC Managed Care – PPO

## 2023-06-09 ENCOUNTER — Ambulatory Visit: Payer: 59

## 2023-06-15 ENCOUNTER — Ambulatory Visit: Payer: BC Managed Care – PPO

## 2023-06-16 ENCOUNTER — Ambulatory Visit: Payer: 59

## 2023-06-22 ENCOUNTER — Ambulatory Visit: Payer: BC Managed Care – PPO

## 2023-06-23 ENCOUNTER — Ambulatory Visit: Payer: 59

## 2023-06-29 ENCOUNTER — Ambulatory Visit: Payer: BC Managed Care – PPO

## 2023-06-30 ENCOUNTER — Ambulatory Visit: Payer: 59 | Attending: Neurology

## 2023-06-30 DIAGNOSIS — R2681 Unsteadiness on feet: Secondary | ICD-10-CM | POA: Insufficient documentation

## 2023-06-30 DIAGNOSIS — R42 Dizziness and giddiness: Secondary | ICD-10-CM | POA: Insufficient documentation

## 2023-06-30 SURGERY — COLONOSCOPY WITH PROPOFOL
Anesthesia: General

## 2023-06-30 NOTE — Therapy (Signed)
OUTPATIENT PHYSICAL THERAPY TREATMENT NOTE/DISCHARGE SUMMARY   Patient Name: Cory Espinoza MRN: [id number] DOB:03/22/1971, 52 y.o., male Today's Date: 06/30/2023  PCP: Jerl Mina MD REFERRING PROVIDER: Cristopher Peru MD      PT End of Session - 06/30/23 1610     Visit Number 106    Number of Visits 121    Date for PT Re-Evaluation 07/21/23    Authorization Type [employer] PPO;    PT Start Time 1616    PT Stop Time 1649    PT Time Calculation (min) 33 min    Equipment Utilized During Treatment Gait belt    Activity Tolerance Patient tolerated treatment well    Behavior During Therapy WFL for tasks assessed/performed                 Past Medical History:  Diagnosis Date   Allergy    Anginal pain (HCC)    Anxiety    Arthritis    lumbar spine   Asthma    Atrial fibrillation (HCC)    Back pain    Severe Lumbar Pain   CHF (congestive heart failure) (HCC)    COPD (chronic obstructive pulmonary disease) (HCC)    Restrictive lung disease   Coronary artery disease    Dyspnea    Dysrhythmia    afib   GERD (gastroesophageal reflux disease)    Headache    [name], onset October 2020, cluster headaches in the past   History of kidney stones    Hyperlipidemia    Hypertension    Myocardial infarction (HCC)    at age 60   Pneumonia    Restrictive lung disease    Sleep apnea    unable to use cpap since onset of migraines   Past Surgical History:  Procedure Laterality Date   ABDOMINAL EXPOSURE N/A 03/25/2020   Procedure: ABDOMINAL EXPOSURE;  Surgeon: Larina Earthly, MD;  Location: [facility] OR;  Service: Vascular;  Laterality: N/A;   ANTERIOR CERVICAL DECOMP/DISCECTOMY FUSION N/A 01/23/2020   Procedure: ANTERIOR CERVICAL DECOMPRESSION FUSION - CERVICAL SIX-CERVICAL SEVEN;  Surgeon: Julio Sicks, MD;  Location: [facility] OR;  Service: Neurosurgery;  Laterality: N/A;   ANTERIOR LUMBAR FUSION N/A 03/25/2020   Procedure: ANTERIOR LUMBAR INTERBODY FUSION LUMBAR FIVE-SACRAL ONE.;   Surgeon: Julio Sicks, MD;  Location: [facility] OR;  Service: Neurosurgery;  Laterality: N/A;  anterior   BACK SURGERY  1996   CARDIAC CATHETERIZATION Left 04/27/2016   Procedure: Left Heart Cath and Coronary Angiography;  Surgeon: Lamar Blinks, MD;  Location: [facility] INVASIVE CV LAB;  Service: Cardiovascular;  Laterality: Left;   CARDIAC CATHETERIZATION N/A 04/27/2016   Procedure: Intravascular Pressure Wire/FFR Study;  Surgeon: Alwyn Pea, MD;  Location: [facility] INVASIVE CV LAB;  Service: Cardiovascular;  Laterality: N/A;   CATHETERIZATION OF PULMONARY ARTERY WITH RETRIEVAL OF FOREIGN BODY Bilateral 04/07/2011   heart   DEGENERATIVE SPONDYLOLISTHESIS     KNEE ARTHROSCOPY Bilateral 2004   LUMBAR DISC SURGERY  1999   RADICULOPATHY, CERVICAL REGION     TONSILLECTOMY     Patient Active Problem List   Diagnosis Date Noted   [name] (coronary artery disease) 10/21/2021   Degenerative spondylolisthesis 03/25/2020   Physical deconditioning 03/04/2020   Lobar pneumonia, unspecified organism (HCC) 03/04/2020   Intractable hemiplegic migraine with status migrainosus 03/02/2020   Current tobacco use 02/05/2020   Abnormal findings on diagnostic imaging of lung 02/05/2020   Shortness of breath 02/05/2020   Herniated disc, cervical 01/23/2020   Cervical spondylosis  with myelopathy and radiculopathy 01/23/2020   Pre-operative respiratory examination 01/12/2020   Weakness on right side of face 12/22/2019   Osteoarthritis of spine with radiculopathy, lumbar region 12/11/2019   Intractable migraine with aura with status migrainosus 11/09/2019   Former smoker 08/27/2018   Benign essential HTN 08/27/2018   Cough productive of purulent sputum 11/08/2017   GERD (gastroesophageal reflux disease) 05/06/2017   Contusion of hand 04/22/2017   BP (high blood pressure) 04/22/2017   Oxygen desaturation 04/22/2017   Prostate lump 04/22/2017   Unstable angina (HCC) 04/27/2016   Chest pain 04/24/2016   Morbid obesity  (HCC) 01/29/2016   Elevated ALT measurement 10/30/2015   Nonspecific elevation of levels of transaminase and lactic acid dehydrogenase (ldh) 10/30/2015   Reduced libido 10/29/2015   Hyperlipidemia 08/06/2015   Anxiety 08/06/2015   Atherosclerosis of coronary artery 08/06/2015   Childhood asthma 08/06/2015   Chronic obstructive pulmonary disease (HCC) 08/06/2015   OSA (obstructive sleep apnea) 08/06/2015   Anxiety disorder 08/06/2015   Atherosclerosis of native coronary artery of native heart with stable angina pectoris (HCC) 08/06/2015   Uncomplicated asthma 08/06/2015    REFERRING DIAG: BPPV/unsteadiness  THERAPY DIAG:  Dizziness and giddiness  Unsteadiness on feet  SUBJECTIVE: Pt states "everything's been going pretty well." He has had a few rough days resulting in previous missed appointments (last in clinic 05/26/2023). He also reports a few falls since last seen in PT. One fall occurred in the shower and another occurred when he fell into his doorframe.  He reports he hurt his back when this fall occurred (3 weeks ago) and that it is still hurting in his mid-back. Pt also reports an upcoming appointment later this summer with a neuro- ophthalmologist.  Pt daughter present for visit.  PAIN:  Are you having pain? Yes: NPRS scale: not reported/10 Pain location: head, neck/back Pain description: pain following fall Aggravating factors: turning head, weather Relieving factors: medicine, dark room  Rationale for Evaluation and Treatment Rehabilitation  PERTINENT HISTORY: Pt has a complex medical history. 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 He states that he fell 10-12 feet and struck his head, neck, and back. He was able to finish the course which took him approximately 45 minutes more to complete. He does endorse a second fall while finishing the course but did not suffer a second head injury. He states that he started getting headaches  that day which have persisted since that time. 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 He does report improvement in his headaches recently. Neurology was concerned about possible BPPV and have referred him for a vestibular evaluation. In addition since the injury, pt developed RUE and RLE pain and weakness. Pt states that NCV showed abnormal nerve conduction in RUE. He has since undergone a C7-7 ACDF on 01/23/20. He reports initial improvement in RUE strength, but states that he has a gradual return of his RUE weakness. He was also having significant RLE weakness and pain and underwent and L5-S1 anterior lumbar interbody fusion on 03/25/20. Patient reports that he does not have any back precautions but needs to wear the low back brace for one more month. Patient reports he has a follow-up appointment with the surgeon next month. Patient reports he has a follow-up appointment with Dr. Clelia Croft, neurologist, in August. He arrives to therapy ambulating with an upright rollator walker. Pt  denies any mention of concussion after his injury. He has been having cognitive issues since his injury and has previously had a heavy metals screen as well as neurocognitive testing. He has had an MRI of his brain with and without contrast on 12/18/19. Results were mildly motion degraded examination. No evidence of acute intracranial abnormality. Minimal chronic small vessel ischemic disease. He has tremor in both his hands. He has a positive family history of Parkinson's disease in his grandfather. He is complaining of constant dizziness and unsteadiness which are worse with activity. Patient states that he frequently loses his balance and his wife has to steady him.   Describe your dizziness without using the term "dizzy:" Pt states he feels unsteady, spinning, nausea,swimmy-headed at times.  He reports spinning can last minutes or longer. Pt is taking  meclizine, reports he usually takes prior to PT, including today. Pt has an inhaler with peppermint he uses for nausea. Pt has vomited before due to vertigo. Vertigo has been on and off since 2020. Pt spouse reports pt fell a couple days ago at night going to the bathroom. Pt hit his head. He states "too many falls in the last six months." Pt cannot always get up when falls to the floor. Pt spouse reports pt had recent seizure that lasted longer than normal (PTSD type seizures). Seizures(?) typically can last from 10 to 24 hours. Yawning frequently is a sign of oncoming seizure for pt. Pt has interrupted sleep. He has neck pain but has been using a massager for his neck and found this to help a bit. Some days his neck pain ranges from 1-2/10, some days 7-8/10. Pt can experience double vision with migraines.Other symptoms include impaired sensation in R leg.  How long does your vertigo last (seconds, hours, >24 hours): Has experienced spinning that lasts over 24 hours  Any other symptoms associated with your dizziness? Unsure if dizzier with migraine symptoms   Do you have trouble with going into busy stores, using screens, or being a passenger in a car and seeing other cars go by?  -issues with being a passenger in a car and watching cars go by  -no issue with screens   -issue with busy stores   What increases likelihood of a migraine? Constantly has a migraine, pt reports the intensity can improve, he has come down to a 2/10 before, can be 5-10/10. Pt getting infusions currently, and thinks they're working. Strong smells can worsen migraine.   What tends to make it more likely you'll have a seizure? Flashing lights, stress and when pt feels angry reports he can have some trouble controlling emotions at times.  PRECAUTIONS: Falls    TODAY'S TREATMENT:  TA: Reviewed discharge recommendations, HEP, and plan. Instructed pt to return sooner and/or follow-up with his physician should his back pain not  improve 5-6 weeks out from onset. Pt verbalized understanding.   TE: All interventions performed seated.   - Shoulder External Rotation and Scapular Retraction with Resistance  1 set - 10 reps bilat - Standing Shoulder Abduction with Self-Anchored Resistance  - 1 set - 10 reps each UE - Standing Shoulder Flexion with Self-Anchored Resistance  - 1 set -  10 reps each UE - Seated Shoulder Row with Anchored Resistance  - 1 set - 10 reps bilat   NMR: - Seated Gaze Stabilization with Head Rotation  1 rep - 30 sec  - Seated Gaze Stabilization with Head Nod  - 1 rep -30 sec -  Seated Horizontal Saccades  - 1 set - 10 reps - Seated Vertical Saccades  - 1 set - 10 reps - Seated Horizontal Smooth Pursuit  - 1 set -30 seconds  - Seated Vertical Smooth Pursuit  - 1 sest -30 seconds     PATIENT EDUCATION: Education details: d/c recommendations, HEP, plan  Person educated: Patient and Child(ren) adult daughter Education method: Explanation, verbal cue, demo, printout Education comprehension: verbalized understanding, returned demonstration   HOME EXERCISE PROGRAM:   06/30/23: Perform all exercises seated  Access Code: [email or web address] URL: [email or web address] Date: 06/30/2023 Prepared by: Temple Pacini  Exercises - Shoulder External Rotation and Scapular Retraction with Resistance  - 1 x daily - 2-5 x weekly - 3 sets - 10 reps - Standing Shoulder Abduction with Self-Anchored Resistance  - 1 x daily - 2-5 x weekly - 3 sets - 10 reps - Standing Shoulder Flexion with Self-Anchored Resistance  - 1 x daily - 2-5 x weekly - 3 sets - 10 reps - Seated Shoulder Row with Anchored Resistance  - 1 x daily - 2-5 x weekly - 3 sets - 10 reps  Access Code: [email or web address] URL: [email or web address] Date: 06/30/2023 Prepared by: Temple Pacini  Exercises - Seated Gaze Stabilization with Head Rotation  - 1 x daily - 7 x weekly - 2-3 sets - 2 reps - 15-30 seconds hold - Seated Gaze  Stabilization with Head Nod  - 1 x daily - 7 x weekly - 2-3 sets - 2 reps - 15-30 seconds hold - Seated Horizontal Saccades  - 1 x daily - 5 x weekly - 2 sets - 10 reps - Seated Vertical Saccades  - 1 x daily - 5 x weekly - 2 sets - 10 reps - Seated Horizontal Smooth Pursuit  - 1 x daily - 5 x weekly - 1 sets - 2 reps - 10-30 seconds hold - Seated Vertical Smooth Pursuit  - 1 x daily - 5 x weekly - 1 sets - 2 reps - 10-30 seconds hold   02/17/23: Access Code: [email or web address] URL: [email or web address] Date: 02/17/2023 Prepared by: Temple Pacini  Exercises - Seated Left Head Turns Vestibular Habituation  - 1 x daily - 5 x weekly - 2 sets - 5-10 reps - Seated Right Head Turns Vestibular Habituation  - 1 x daily - 5 x weekly - 2 sets - 5-10 reps - Seated Head Nods Vestibular Habituation  - 1 x daily - 5 x weekly - 2 sets - 5-10 reps - Vestibular Habituation - Head Tilting  - 1 x daily - 5 x weekly - 2 sets - 5-10 reps - Seated Gaze Stabilization with Head Rotation  - 1 x daily - 7 x weekly - 3 sets - 1 reps - 30-60 seconds hold - Seated Gaze Stabilization with Head Nod  - 1 x daily - 7 x weekly - 3 sets - 1 reps - 30-60 seconds hold  01/06/23:  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 Added video to HEP to perform 1x/daily (do not perform if having a high symptom day, however) until symptoms reach 2/10, then rest. Watch video to tolerance. Do not perform this back to back with VOR intervention, allow for sufficient rest until symptoms have decreased back to 0/10.  09/15/2022: Access Code: [email or web address] URL: [email or web address] Date: 09/15/2022 Prepared by: Temple Pacini  Exercises - Seated Gaze Stabilization with Head Rotation  - 1 x daily - 7 x weekly - 3 sets - 2 reps - 30 sec hold  PT Short Term Goals  target date 02/17/2023 new target date 06/23/2023            PT SHORT TERM GOAL #1   Title Patient will be independent in  home exercise program to improve strength/mobility for better functional independence with ADLs and for self-management.    Baseline 3/3 HEP compliant 3/31 hep compliant    Time 6    Period Weeks    Status Achieved    Target Date 02/20/21      PT SHORT TERM GOAL #2   Title Patient will be modified independent in walking on even/uneven surface with least restrictive assistive device, for 10+ minutes without rest break, reporting some difficulty or less to improve walking tolerance with community ambulation including grocery shopping, going to church,etc.    Baseline 02/03: instability, LOB multiple times while ambulating to elevator 3/31: unable to test due to throwing up 6/20: 6 minutes 7/12: unable to test due to migraine 9/12: defer to next session 11/15: unable to test due to migraine 3/2: requires heavy use of AD and seated rest breaks 4/20: walked around yard yesterday    Time 6    Period Weeks    Status Previously partially Met  - now discontinued as pt focusing on VRT   Target Date 03/19/22      PT SHORT TERM GOAL #3   Title Patient will increase ABC scale score >80% to demonstrate better functional mobility and better confidence with ADLs.    Baseline 01/09/21: 60.625% 3/3/: 50% 3/31: 63% 6/20: 45% 7/25: 40% 9/12: 51.1% 11/15: 48% 1/17: 40% 3/2: 33% 4/20: 39% 6/6: 51% 01/06/23: 46.88%   Time 6    Period Weeks    Status Partially Met    Target Date              PT Long Term Goals - target date 01/26/2023 new target date 07/21/2023            PT LONG TERM GOAL #1   Title Patient will increase FOTO score to equal to or greater than 65 to demonstrate statistically significant improvement in mobility and quality of life.    Baseline scored 41/100 on 05/21/2020; 7/22 40, 8/23: 60/100, 09/30/20=40, 01/09/21: 46 3/3: 40.3% 3/31: 49% 6/20: 45% 7/12: 50.7% 9/12: 44.7% 11/15: 40% 1/17: 40% 3/2: 42%    Time 12    Period Weeks    Status Not Met - now discontinued as pt focusing on  VRT rehab   Target Date 10/20/22       PT LONG TERM GOAL #2   Title Patient will reduce falls risk as indicated by decreased TUG time to less than 11 seconds.    Baseline scored 37.9 sec with TUG on 05/21/2020; 21seconds with elevated rollator 7/22, 8/23: 13.62 sec with up and go walker, 01/09/21: 15.81s with hiking stick 3/3: 17.98 seconds one near LOB 3/31: 15.57 seconds with walking stick 6/20: 14.8 seconds with walking stick. 7/25: 19.72 seconds with walking stick. 9/26: 17.6 seconds 11/15: unable to test due to migraine 1/17: deferred due to pain 3/2:19.25 seconds with walking stick in involved hand 4/20: deferred due to migraine and back pain 6/6: 10.5 seconds with walking stick    Time 12    Period Weeks    Status Achieved    Target Date 04/30/22      PT LONG TERM GOAL #3   Title Patient will increase right UE and LE gross strength to 4+/5 throughout to improve functional  strength for independent gait, increased standing tolerance and increased ADL ability.    Baseline grossly +3/5 to -4/5 right UE and LE strength on 05/21/2020; grossly 4-/5 for RUE, grossly 4-/5 for RLE on 7/22, grossly 4-/5 RUE, grossly 4/5 RLE 3/3: see note 3/2: see note 4/20: RLE 4-/5 with quad 4/5 RUE 4/5 with bicep 3+/5 6/6: see note: 7/25: see note   Time 12    Period Weeks    Status Partially Met - now discontinued as pt focusing on VRT   Target Date 10/20/22     PT LONG TERM GOAL #4   Title Patient will increase 10 meter walk test to >1.49m/s as to improve gait speed for better community ambulation and to reduce fall risk.    Baseline on 7/22 .57 m/sself selected, fast  .73 m/s with LOB pt reported R knee buckling, elevated rollator used, 8/23: 1.06 m/s with up and go walker, 01/09/21: 0.72 m/s with hiking stick 3/3: 0.74 m/s with walking stick 3/31: 0.81 m/s w walking stick 6/20: 1.1 m/s with walking stick    Time 12    Period Weeks    Status Achieved      PT LONG TERM GOAL #5   Title Patient will increase  Berg Balance score by > 6 points to demonstrate decreased fall risk during functional activities.    Baseline 8/23: 37/56, 09/30/20=40/56, 12/20: 42/56 3/3: deferred 3/31 will perform next time; deferred due to throwing up 6/20: 46/56 7/12 38/56    Time 8    Period Weeks    Status Achieved      Additional Long Term Goals   Additional Long Term Goals Yes      PT LONG TERM GOAL #6   Title Patient (< 26 years old) will complete five times sit to stand test in < 10 seconds indicating an increased LE strength and improved balance.    Baseline 8/23: 22 sec, 09/30/20=13.04 sec, 12/20: 27.05 sec, 01/09/21: 18.01s 3/3: 23 seconds no UE support 3/31; deferred due to patient throwing up 7/12: 15.2 seconds 11/15: 26 seconds 1/17: deferred due to pain 3/2: 17.5 seconds 4/20: deferred due to migraine and back pain 6/6: 11 seconds with SUE support 8/22: 13.7 seconds with SUE support; 01/06/23: 12 seconds hands-free; 05/26/23 13 seconds   Time 12    Period Weeks    Status Partially Met    Target Date      PT LONG TERM GOAL #7   Title Patient will improve 6 min walk test >1000 feet with LRAD for improved gait ability in community.    Baseline 8/23: not tested; 8/30 845, goal adjusted >2048ft, 09/30/20=600 feet - stopped test due to R ankle catching making gait unsafe, 11/25/20= 330 feet - stopped test due to severe dizziness, vomiting/nausea, sweating making gait unsafe; 01/09/21: 720 ft with no rest breaks 3/3: deferred due to nausea 3/31: deferred due to patient throwing up 6/20: 640 ft 7/12: 624ft with walking stick, no rest breaks; 9/26: 1000 ft    Time 12    Period Weeks    Status Achieved      PT LONG TERM GOAL #8   Title Patient will increase Berg Balance score by > 6 points  (52/56) to demonstrate decreased fall risk during functional activities.    Baseline 6/20: 46/56 7/12: 38/56 with migraine 9/12: defer to next session 11/15:  unable to test due to migraine 1/17: deferred due to pain 3/2: next  session 4/20: deferred due to migraine  and back pain; 01/06/23: deferred on this date, will require +2 for safety with testing; 05/26/23: previously partially met, no longer area of focus/discontinued    Time 12    Period Weeks    Status Partially Met    Target Date 10/20/22     PT LONG TERM GOAL  #9   TITLE Patient will report decreased frequency of falling per week to 1x/week for decreased fall risk and improved quality of life    Baseline 12/1: 4 falls minimum a week 1/17: 1 fall last week 3/2: has been bed bound the week prior 4/20: 2-3x/week 6/6: 2x/week 7/25: 2x/week; 11/28: average 3 falls/week; 12/09/22: reports 2 falls since last seen (11/03/2022); 01/06/2023: pt reports averaging 2 falls/week; 02/17/2023: Pt reports average of 2-3x/week ; 05/26/23: 1-2 falls/week    Time 12    Period Weeks    Status MET    Target Date 08/04/22      PT LONG TERM GOAL  #10   TITLE Patient will turn head 75% of the time without dizziness or LOB.    Baseline 6/6: patient falls and has extreme dizziness resulting in throwing up. 7/25: 25% of the time; 11/28: "it happens a lot 65% of the time"; 12/09/22: head turns continue to increase pt's dizziness to 4-5/10; 01/06/23: Pt reports this fluctuates, can turn head 10x some days with VOR before increase in dizziness, other days pt is highly symptomatic with head turns; 05/26/23: pt reports this fluctuates can get up to 10 head turns some days, some days only 2-3/10   Time 12    Period Weeks    Status Partially Met   Target Date 10/20/22     PT LONG TERM GOAL  #11   TITLE Patient will report a worst pain of 4/10 on VAS in cervical and lumbar  to improve tolerance with ADLs and reduced symptoms with activities.    Baseline 6/6: lumbar: 10/10, cervical 8/10 7/25: lumbar 6-7/10; cervical 5-6/10; 05/26/23: pt reports back pain has been up to a 10   Time 12    Period Weeks    Status Partially Met - now discontinued as pt focusing on VRT   Target Date 10/20/22               Plan     Clinical Impression Statement Pt seen for discharge visit. Over course of VRT pt did show modest improvements with dizziness goal and decreasing fall frequency goal. PT updated and provided HEP for discharge for pt to continue independently outside of PT (see above). Pt frequently too symptomatic to attend therapy over previous month and decision was made to discharge at this time as pt not able to make gains at current frequency. Pt agreeable to discharge on this date. However, he would likely benefit from PT in the future if pt can attend at recommended frequency to address other functional deficits, including gait, strength and balance deficits.     Personal Factors and Comorbidities Comorbidity 3+;Time since onset of injury/illness/exacerbation    Comorbidities anxiety, back pain, COPD, headache, HTN, MI, sleep apnea, migraines    Examination-Activity Limitations Locomotion Level;Transfers;Bed Mobility;Stand;Stairs;Sleep;Squat;Bend    Examination-Participation Restrictions Driving;Medication Management    Stability/Clinical Decision Making Unstable/Unpredictable    Rehab Potential Fair    PT Frequency 1x / week    PT Duration 8 weeks    PT Treatment/Interventions ADLs/Self Care Home Management;Canalith Repostioning;Moist Heat;Electrical Stimulation;Gait training;Stair training;Functional mobility training;Therapeutic activities;Therapeutic exercise;Balance training;Neuromuscular re-education;Patient/family education;Manual techniques;Passive range of motion;Vestibular  PT Next Visit Plan D/c   PT Home Exercise Plan Medbridge Access Code: [email or web address]    Consulted and Agree with Plan of Care Patient;Family member/caregiver    Family Member Consulted Wife               Temple Pacini PT, DPT  06/30/2023, 5:12 PM

## 2023-07-07 ENCOUNTER — Ambulatory Visit: Payer: 59

## 2023-07-07 ENCOUNTER — Encounter
Admission: RE | Disposition: A | Discharge status: Home / Self Care | Payer: Self-pay | Source: Home / Self Care | Attending: Internal Medicine

## 2023-07-07 ENCOUNTER — Ambulatory Visit: Payer: 59 | Admitting: Anesthesiology

## 2023-07-07 ENCOUNTER — Ambulatory Visit: Admit: 2023-07-07 | Payer: 59 | Admitting: Internal Medicine

## 2023-07-07 ENCOUNTER — Encounter: Payer: Self-pay | Admitting: Internal Medicine

## 2023-07-07 ENCOUNTER — Ambulatory Visit
Admission: RE | Admit: 2023-07-07 | Discharge: 2023-07-07 | Disposition: A | Discharge status: Home / Self Care | Payer: 59 | Attending: Internal Medicine | Admitting: Internal Medicine

## 2023-07-07 DIAGNOSIS — Z7902 Long term (current) use of antithrombotics/antiplatelets: Secondary | ICD-10-CM | POA: Insufficient documentation

## 2023-07-07 DIAGNOSIS — Z1211 Encounter for screening for malignant neoplasm of colon: Secondary | ICD-10-CM | POA: Diagnosis present

## 2023-07-07 HISTORY — PX: COLONOSCOPY WITH PROPOFOL: SHX5780

## 2023-07-07 SURGERY — COLONOSCOPY WITH PROPOFOL
Anesthesia: General

## 2023-07-07 MED ORDER — PROPOFOL 500 MG/50ML IV EMUL
INTRAVENOUS | Status: DC | PRN
  Administered 2023-07-07: 165 ug/kg/min via INTRAVENOUS

## 2023-07-07 MED ORDER — DEXMEDETOMIDINE HCL IN NACL 200 MCG/50ML IV SOLN
INTRAVENOUS | Status: DC | PRN
  Administered 2023-07-07 (×2): 12 ug via INTRAVENOUS

## 2023-07-07 MED ORDER — PROPOFOL 10 MG/ML IV BOLUS
INTRAVENOUS | Status: DC | PRN
  Administered 2023-07-07: 70 mg via INTRAVENOUS
  Administered 2023-07-07: 20 mg via INTRAVENOUS
  Administered 2023-07-07: 30 mg via INTRAVENOUS
  Administered 2023-07-07: 20 mg via INTRAVENOUS

## 2023-07-07 MED ORDER — LIDOCAINE HCL (CARDIAC) PF 100 MG/5ML IV SOSY
PREFILLED_SYRINGE | INTRAVENOUS | Status: DC | PRN
  Administered 2023-07-07: 100 mg via INTRAVENOUS

## 2023-07-07 MED ORDER — SODIUM CHLORIDE 0.9 % IV SOLN
INTRAVENOUS | Status: DC
  Administered 2023-07-07: 1000 mL via INTRAVENOUS

## 2023-07-07 NOTE — Transfer of Care (Signed)
Immediate Anesthesia Transfer of Care Note  Patient: Cory Espinoza  Procedure(s) Performed: COLONOSCOPY WITH PROPOFOL  Patient Location: Endoscopy Unit  Anesthesia Type:General  Level of Consciousness: drowsy and patient cooperative  Airway & Oxygen Therapy: Patient Spontanous Breathing and Patient connected to face mask oxygen  Post-op Assessment: Report given to RN and Post -op Vital signs reviewed and stable  Post vital signs: Reviewed and stable  Last Vitals:  Vitals Value Taken Time  BP 162/95 07/07/23 1331  Temp    Pulse 83 07/07/23 1336  Resp 15 07/07/23 1337  SpO2 96 % 07/07/23 1336  Vitals shown include unfiled device data.  Last Pain:  Vitals:   07/07/23 1238  TempSrc: Temporal  PainSc: 0-No pain         Complications: No notable events documented.
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
## 2023-07-07 NOTE — Anesthesia Preprocedure Evaluation (Signed)
Anesthesia Evaluation  Patient identified by MRN, date of birth, ID band Patient awake    Reviewed: Allergy & Precautions, H&P , NPO status , Patient's Chart, lab work & pertinent test results, reviewed documented beta blocker date and time   History of Anesthesia Complications Negative for: history of anesthetic complications  Airway Mallampati: II  TM Distance: >3 FB Neck ROM: full    Dental  (+) Dental Advidsory Given, Missing, Poor Dentition, Chipped   Pulmonary shortness of breath and with exertion, asthma , sleep apnea and Oxygen sleep apnea , COPD,  COPD inhaler, neg recent URI, former smoker   Pulmonary exam normal breath sounds clear to auscultation       Cardiovascular Exercise Tolerance: Good hypertension, (-) angina + [name], + Past MI and +CHF  (-) Cardiac Stents and (-) CABG + dysrhythmias Atrial Fibrillation (-) Valvular Problems/Murmurs Rhythm:regular Rate:Normal     Neuro/Psych  Headaches, Seizures -, Well Controlled,  PSYCHIATRIC DISORDERS Anxiety        GI/Hepatic Neg liver ROS,GERD  ,,  Endo/Other  negative endocrine ROS    Renal/GU negative Renal ROS  negative genitourinary   Musculoskeletal   Abdominal   Peds  Hematology negative hematology ROS (+)   Anesthesia Other Findings Past Medical History: No date: Allergy No date: Anginal pain (HCC) No date: Anxiety No date: Arthritis     Comment:  lumbar spine No date: Asthma No date: Atrial fibrillation (HCC) No date: Back pain     Comment:  Severe Lumbar Pain No date: CHF (congestive heart failure) (HCC) No date: COPD (chronic obstructive pulmonary disease) (HCC)     Comment:  Restrictive lung disease No date: Coronary artery disease No date: Dyspnea No date: Dysrhythmia     Comment:  afib No date: GERD (gastroesophageal reflux disease) No date: Headache     Comment:  [name], onset October 2020, cluster headaches               in  the past No date: History of kidney stones No date: Hyperlipidemia No date: Hypertension No date: Myocardial infarction (HCC)     Comment:  at age 79 No date: Pneumonia No date: Restrictive lung disease No date: Sleep apnea     Comment:  unable to use cpap since onset of migraines   Reproductive/Obstetrics negative OB ROS                             Anesthesia Physical Anesthesia Plan  ASA: 4  Anesthesia Plan: General   Post-op Pain Management:    Induction: Intravenous  PONV Risk Score and Plan: 2 and Propofol infusion and TIVA  Airway Management Planned: Natural Airway and Nasal Cannula  Additional Equipment:   Intra-op Plan:   Post-operative Plan:   Informed Consent: I have reviewed the patients History and Physical, chart, labs and discussed the procedure including the risks, benefits and alternatives for the proposed anesthesia with the patient or authorized representative who has indicated his/her understanding and acceptance.     Dental Advisory Given  Plan Discussed with: Anesthesiologist, CRNA and Surgeon  Anesthesia Plan Comments:        Anesthesia Quick Evaluation

## 2023-07-07 NOTE — Interval H&P Note (Signed)
History and Physical Interval Note:  07/07/2023 1:01 PM  Cory Espinoza  has presented today for surgery, with the diagnosis of V76.51 (ICD-9-CM) - Z12.11 (ICD-10-CM) - Colon cancer screening.  The various methods of treatment have been discussed with the patient and family. After consideration of risks, benefits and other options for treatment, the patient has consented to  Procedure(s): COLONOSCOPY WITH PROPOFOL (N/A) as a surgical intervention.  The patient's history has been reviewed, patient examined, no change in status, stable for surgery.  I have reviewed the patient's chart and labs.  Questions were answered to the patient's satisfaction.     [place], [place]

## 2023-07-07 NOTE — Anesthesia Procedure Notes (Addendum)
Procedure Name: General with mask airway Date/Time: 07/07/2023 1:16 PM  Performed by: Mohammed Kindle, CRNAPre-anesthesia Checklist: Patient identified, Emergency Drugs available, Suction available and Patient being monitored Patient Re-evaluated:Patient Re-evaluated prior to induction Oxygen Delivery Method: Simple face mask Induction Type: IV induction Placement Confirmation: positive ETCO2, CO2 detector and breath sounds checked- equal and bilateral Dental Injury: Teeth and Oropharynx as per pre-operative assessment

## 2023-07-07 NOTE — Op Note (Signed)
[facility] Gastroenterology Patient Name: Cory Espinoza Procedure Date: 07/07/2023 1:13 PM MRN: [id number] Account #: [id number] Date of Birth: 1971/06/04 Admit Type: Outpatient Age: 52 Room: [facility] ENDO ROOM 2 Gender: Male Note Status: Finalized Instrument Name: Nelda Marseille [id number] Procedure:             Colonoscopy Indications:           Screening for colorectal malignant neoplasm Providers:             Royce Macadamia K. Norma Fredrickson MD, MD Referring MD:          Rhona Leavens. Burnett Sheng, MD (Referring MD) Medicines:             Propofol per Anesthesia Complications:         No immediate complications. Procedure:             Pre-Anesthesia Assessment:                        - The risks and benefits of the procedure and the                         sedation options and risks were discussed with the                         patient. All questions were answered and informed                         consent was obtained.                        - Patient identification and proposed procedure were                         verified prior to the procedure by the nurse. The                         procedure was verified in the procedure room.                        - ASA Grade Assessment: III - A patient with severe                         systemic disease.                        - After reviewing the risks and benefits, the patient                         was deemed in satisfactory condition to undergo the                         procedure.                        After obtaining informed consent, the colonoscope was                         passed under direct vision. Throughout the procedure,  the patient's blood pressure, pulse, and oxygen                         saturations were monitored continuously. The                         Colonoscope was introduced through the anus and                         advanced to the the cecum, identified by appendiceal                          orifice and ileocecal valve. The colonoscopy was                         performed without difficulty. The patient tolerated                         the procedure well. The quality of the bowel                         preparation was adequate. The ileocecal valve,                         appendiceal orifice, and rectum were photographed. Findings:      The perianal and digital rectal examinations were normal. Pertinent       negatives include normal sphincter tone and no palpable rectal lesions.      The entire examined colon appeared normal on direct and retroflexion       views. Impression:            - The entire examined colon is normal on direct and                         retroflexion views.                        - No specimens collected. Recommendation:        - Patient has a contact number available for                         emergencies. The signs and symptoms of potential                         delayed complications were discussed with the patient.                         Return to normal activities tomorrow. Written                         discharge instructions were provided to the patient.                        - Resume previous diet.                        - Continue present medications.                        - Repeat colonoscopy  in 10 years for screening                         purposes.                        - Return to GI office PRN.                        - The findings and recommendations were discussed with                         the patient. Procedure Code(s):     --- Professional ---                        M8413, Colorectal cancer screening; colonoscopy on                         individual not meeting criteria for high risk Diagnosis Code(s):     --- Professional ---                        Z12.11, Encounter for screening for malignant neoplasm                         of colon CPT copyright 2022 American Medical Association. All rights reserved. The  codes documented in this report are preliminary and upon coder review may  be revised to meet current compliance requirements. Stanton Kidney MD, MD 07/07/2023 1:33:04 PM This report has been signed electronically. Number of Addenda: 0 Note Initiated On: 07/07/2023 1:13 PM Scope Withdrawal Time: 0 hours 5 minutes 52 seconds  Total Procedure Duration: 0 hours 9 minutes 57 seconds  Estimated Blood Loss:  Estimated blood loss: none.      [facility]

## 2023-07-08 ENCOUNTER — Encounter: Payer: Self-pay | Admitting: Internal Medicine

## 2023-07-08 NOTE — Anesthesia Postprocedure Evaluation (Signed)
Anesthesia Post Note  Patient: Cory Espinoza  Procedure(s) Performed: COLONOSCOPY WITH PROPOFOL  Patient location during evaluation: Endoscopy Anesthesia Type: General Level of consciousness: awake and alert Pain management: pain level controlled Vital Signs Assessment: post-procedure vital signs reviewed and stable Respiratory status: spontaneous breathing, nonlabored ventilation, respiratory function stable and patient connected to nasal cannula oxygen Cardiovascular status: blood pressure returned to baseline and stable Postop Assessment: no apparent nausea or vomiting Anesthetic complications: no   No notable events documented.   Last Vitals:  Vitals:   07/07/23 1238 07/07/23 1332  BP: (!) 134/115   Pulse: 77 88  Resp: 18   Temp: (!) 35.8 C 36.8 C  SpO2: 98%     Last Pain:  Vitals:   07/08/23 0733  TempSrc:   PainSc: 0-No pain                 Lenard Simmer

## 2023-07-14 ENCOUNTER — Ambulatory Visit: Payer: Medicare Other

## 2023-07-21 ENCOUNTER — Ambulatory Visit: Payer: Medicare Other

## 2023-07-28 ENCOUNTER — Ambulatory Visit: Payer: Medicare Other

## 2023-08-04 ENCOUNTER — Ambulatory Visit: Payer: Medicare Other

## 2023-08-07 ENCOUNTER — Other Ambulatory Visit: Payer: Self-pay | Admitting: Cardiovascular Disease

## 2023-08-11 ENCOUNTER — Ambulatory Visit: Payer: Medicare Other

## 2023-08-18 ENCOUNTER — Ambulatory Visit: Payer: Medicare Other

## 2023-08-25 ENCOUNTER — Ambulatory Visit: Payer: Medicare Other

## 2023-09-01 ENCOUNTER — Ambulatory Visit: Payer: Medicare Other

## 2023-09-08 ENCOUNTER — Ambulatory Visit: Payer: 59

## 2023-09-15 ENCOUNTER — Ambulatory Visit: Payer: 59

## 2023-09-22 ENCOUNTER — Ambulatory Visit: Payer: 59

## 2023-09-29 ENCOUNTER — Ambulatory Visit: Payer: 59

## 2023-10-06 ENCOUNTER — Ambulatory Visit: Payer: 59

## 2023-10-10 ENCOUNTER — Encounter: Payer: Self-pay | Admitting: Cardiovascular Disease

## 2023-10-11 ENCOUNTER — Other Ambulatory Visit: Payer: Self-pay | Admitting: *Deleted

## 2023-10-11 MED ORDER — NITROGLYCERIN 0.4 MG SL SUBL: 0.4000 mg | SUBLINGUAL_TABLET | SUBLINGUAL | 3 refills | Status: AC | PRN

## 2023-10-12 ENCOUNTER — Telehealth (INDEPENDENT_AMBULATORY_CARE_PROVIDER_SITE_OTHER): Payer: 59 | Admitting: Adult Health

## 2023-10-12 ENCOUNTER — Encounter: Payer: Self-pay | Admitting: Adult Health

## 2023-10-12 DIAGNOSIS — Z720 Tobacco use: Secondary | ICD-10-CM

## 2023-10-12 DIAGNOSIS — G4733 Obstructive sleep apnea (adult) (pediatric): Secondary | ICD-10-CM

## 2023-10-12 DIAGNOSIS — R0902 Hypoxemia: Secondary | ICD-10-CM | POA: Diagnosis not present

## 2023-10-12 DIAGNOSIS — G8929 Other chronic pain: Secondary | ICD-10-CM

## 2023-10-12 DIAGNOSIS — J449 Chronic obstructive pulmonary disease, unspecified: Secondary | ICD-10-CM

## 2023-10-12 DIAGNOSIS — Z87891 Personal history of nicotine dependence: Secondary | ICD-10-CM
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
## 2023-10-12 NOTE — Addendum Note (Signed)
Addended by: Delrae Rend on: 10/12/2023 10:22 AM   Modules accepted: Orders

## 2023-10-12 NOTE — Patient Instructions (Addendum)
Refer to Lung cancer CT chest screening program  Continue on Spiriva 2 puffs daily  Albuterol As needed   Activity as tolerated  Work on healthy weight loss Use caution with sedating medications Do not drive if sleepy.  Continue on Oxygen 2 l/m At bedtime   Sleep with Head of bed at 30 degree incline. Avoid sleeping on back.  Recommend Pulmonary Functioning test on return visit in 6 months  Follow-up with Dr. Belia Heman  in 6 months and as needed

## 2023-10-13 ENCOUNTER — Ambulatory Visit: Payer: 59

## 2023-10-20 ENCOUNTER — Ambulatory Visit: Payer: 59

## 2023-10-27 ENCOUNTER — Ambulatory Visit: Payer: 59

## 2023-11-10 ENCOUNTER — Other Ambulatory Visit: Payer: Self-pay

## 2023-11-10 DIAGNOSIS — Z87891 Personal history of nicotine dependence: Secondary | ICD-10-CM

## 2023-11-10 DIAGNOSIS — Z122 Encounter for screening for malignant neoplasm of respiratory organs: Secondary | ICD-10-CM

## 2023-11-15 ENCOUNTER — Other Ambulatory Visit: Payer: Self-pay | Admitting: Cardiovascular Disease

## 2023-11-15 ENCOUNTER — Encounter: Payer: Self-pay | Admitting: Cardiovascular Disease

## 2023-11-15 ENCOUNTER — Ambulatory Visit: Payer: 59 | Attending: Cardiovascular Disease | Admitting: Cardiovascular Disease

## 2023-11-15 VITALS — BP 120/80 | HR 88 | Ht 72.0 in | Wt 273.0 lb

## 2023-11-15 DIAGNOSIS — I25118 Atherosclerotic heart disease of native coronary artery with other forms of angina pectoris: Secondary | ICD-10-CM | POA: Diagnosis not present

## 2023-11-15 DIAGNOSIS — R079 Chest pain, unspecified: Secondary | ICD-10-CM | POA: Diagnosis not present

## 2023-11-15 DIAGNOSIS — J432 Centrilobular emphysema: Secondary | ICD-10-CM | POA: Diagnosis not present

## 2023-11-15 DIAGNOSIS — Z79899 Other long term (current) drug therapy: Secondary | ICD-10-CM

## 2023-11-15 DIAGNOSIS — I10 Essential (primary) hypertension: Secondary | ICD-10-CM

## 2023-11-15 DIAGNOSIS — I209 Angina pectoris, unspecified: Secondary | ICD-10-CM

## 2023-11-15 MED ORDER — METOPROLOL SUCCINATE ER 50 MG PO TB24: 50.0000 mg | ORAL_TABLET | Freq: Every day | ORAL | 3 refills | Status: DC

## 2023-11-15 NOTE — Patient Instructions (Addendum)
Medication Instructions:  No changes  If you need a refill on your cardiac medications before your next appointment, please call your pharmacy.   Lab work: [facility] paper order: lipids, CMP  Testing/Procedures: No new testing needed  Follow-Up: At [facility], you and your health needs are our priority.  As part of our continuing mission to provide you with exceptional heart care, we have created designated Provider Care Teams.  These Care Teams include your primary Cardiologist (physician) and Advanced Practice Providers (APPs -  Physician Assistants and Nurse Practitioners) who all work together to provide you with the care you need, when you need it.  You will need a follow up appointment in 12 months  Providers on your designated Care Team:   Nicolasa Ducking, NP Eula Listen, PA-C Cadence Fransico Michael, [place]  COVID-19 Vaccine Information can be found at: [email or web address] For questions related to vaccine distribution or appointments, please email [email or web address] or call [phone].

## 2023-11-15 NOTE — Progress Notes (Signed)
Date:  11/15/2023   ID:  [name], DOB 1971-02-01, MRN [id number]  Patient Location:  [address] [place] [address]   Provider location:   [facility], [place] office  PCP:  Jerl Mina, MD  Cardiologist:  [facility]  Chief Complaint  Patient presents with   6 month follow up     "Doing well." Medications reviewed by the patient verbally.     History of Present Illness:    BRENNDEN THAKORE is a 52 y.o. male with past medical history of Smoker, COPD, quit 6 years ago Anxiety Hyperlipidemia [name], cardiac catheterization  04/27/2016 Ost 2nd Mrg lesion, 75% stenosed. Mid Cx lesion, 70% stenosed. HTN OSA, not on CPAP 11/2017: PNA Prior withdrawal from xanax 02/2018 Allergy to asa (swelling in throat/face) Who presents for follow-up of his coronary artery disease, stable angina  Last seen by myself in clinic March 2024 In follow-up today reports doing well Presents with family  Rare chest pain, takes NTG, pain into shoulder No clear escalation in his symptoms  Rare falls, chronic gait instability No exercise program Denies leg swelling, no shortness of breath on exertion  Prior testing reviewed Stress test 5/24 Pharmacological myocardial perfusion imaging study with no significant  ischemia Normal wall motion, EF estimated at 56% No EKG changes concerning for ischemia at peak stress or in recovery. CT attenuation correction images with no significant aortic atherosclerosis  Low risk scan   EKG personally reviewed by myself on todays visit EKG Interpretation Date/Time:  Monday November 15 2023 16:30:46 EST Ventricular Rate:  88 PR Interval:  134 QRS Duration:  88 QT Interval:  374 QTC Calculation: 452 R Axis:   -7  Text Interpretation: Normal sinus rhythm Normal ECG When compared with ECG of 07-Feb-2020 12:52, Questionable change in QRS axis Confirmed by Julien Nordmann [phone]) on 11/15/2023 4:56:13 PM     Large hand laceration January 2023 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   History of severe migraines,  History of falls, hit head on occasions Back surgery x2 Memory issue, long term , Chronic tremor Continues with physical therapy to restore strength on right side arm and leg, Does not drive  CT chest 5/73/2202 CT chest 01/03/2018  cardiac catheterization  04/27/2016 Lesions initially felt to be 70 or 75%, downgraded after FFR negative FFR of AV groove lesion in OM1 ostial lesion Ost 2nd Mrg lesion, 60% stenosed. Mid Cx lesion, 50% stenosed.  Prior review of lab work shows Total cholesterol 148 LDL 88 Borderline sugars, glucose 100  Past Medical History:  Diagnosis Date   Allergy    Anginal pain (HCC)    Anxiety    Arthritis    lumbar spine   Asthma    Atrial fibrillation (HCC)    Back pain    Severe Lumbar Pain   CHF (congestive heart failure) (HCC)    COPD (chronic obstructive pulmonary disease) (HCC)    Restrictive lung disease   Coronary artery disease    Dyspnea    Dysrhythmia    afib   GERD (gastroesophageal reflux disease)    Headache    [name], onset October 2020, cluster headaches in the past   History of kidney stones    Hyperlipidemia    Hypertension    Myocardial infarction (HCC)  at age 70   Pneumonia    Restrictive lung disease    Sleep apnea    unable to use cpap since onset of migraines   Past Surgical History:  Procedure Laterality Date   ABDOMINAL EXPOSURE N/A 03/25/2020   Procedure: ABDOMINAL EXPOSURE;  Surgeon: Larina Earthly, MD;  [facility];  Service: Vascular;  Laterality: N/A;   ANTERIOR CERVICAL DECOMP/DISCECTOMY FUSION N/A 01/23/2020   Procedure: ANTERIOR CERVICAL DECOMPRESSION FUSION - CERVICAL SIX-CERVICAL SEVEN;  Surgeon: Julio Sicks, MD;   Location: [facility] OR;  Service: Neurosurgery;  Laterality: N/A;   ANTERIOR LUMBAR FUSION N/A 03/25/2020   Procedure: ANTERIOR LUMBAR INTERBODY FUSION LUMBAR FIVE-SACRAL ONE.;  Surgeon: Julio Sicks, MD;  Location: [facility] OR;  Service: Neurosurgery;  Laterality: N/A;  anterior   BACK SURGERY  1996   CARDIAC CATHETERIZATION Left 04/27/2016   Procedure: Left Heart Cath and Coronary Angiography;  Surgeon: Lamar Blinks, MD;  Location: [facility] INVASIVE CV LAB;  Service: Cardiovascular;  Laterality: Left;   CARDIAC CATHETERIZATION N/A 04/27/2016   Procedure: Intravascular Pressure Wire/FFR Study;  Surgeon: Alwyn Pea, MD;  Location: [facility] INVASIVE CV LAB;  Service: Cardiovascular;  Laterality: N/A;   CATHETERIZATION OF PULMONARY ARTERY WITH RETRIEVAL OF FOREIGN BODY Bilateral 04/07/2011   heart   COLONOSCOPY WITH PROPOFOL N/A 07/07/2023   Procedure: COLONOSCOPY WITH PROPOFOL;  Surgeon: Toledo, Boykin Nearing, MD;  Location: [facility] ENDOSCOPY;  Service: Gastroenterology;  Laterality: N/A;   DEGENERATIVE SPONDYLOLISTHESIS     KNEE ARTHROSCOPY Bilateral 2004   LUMBAR DISC SURGERY  1999   RADICULOPATHY, CERVICAL REGION     TONSILLECTOMY        Allergies:   Aspirin, Amoxicillin, Codeine, Penicillins, Cefdinir, Other, and Hydrocodone-acetaminophen   Social History   Tobacco Use   Smoking status: Former    Current packs/day: 0.00    Average packs/day: 2.0 packs/day for 23.0 years (46.0 ttl pk-yrs)    Types: Cigarettes, Pipe    Start date: 10/28/1989    Quit date: 10/28/2012    Years since quitting: 11.0   Smokeless tobacco: Former    Types: Snuff   Tobacco comments:    Smokes a pipe occasionally   Vaping Use   Vaping status: Never Used  Substance Use Topics   Alcohol use: Yes    Alcohol/week: 0.0 standard drinks of alcohol    Comment: rarely   Drug use: No     Current Outpatient Medications on File Prior to Visit  Medication Sig Dispense Refill   albuterol (PROVENTIL HFA;VENTOLIN HFA) 108 (90 Base)  MCG/ACT inhaler Inhale 1-2 puffs into the lungs every 4 (four) hours as needed for wheezing or shortness of breath. 1 Inhaler 5   ALPRAZolam (XANAX) 1 MG tablet Take 1 mg by mouth 3 (three) times daily.     ascorbic acid (VITAMIN C) 1000 MG tablet Take 1,000 mg by mouth daily.     atorvastatin (LIPITOR) 40 MG tablet Take 1 tablet (40 mg total) by mouth at bedtime. 90 tablet 1   B COMPLEX VITAMINS PO Take 1 tablet by mouth daily.      baclofen (LIORESAL) 10 MG tablet Take 10 mg by mouth 2 (two) times daily as needed for muscle spasms.     benzonatate (TESSALON) 200 MG capsule Take 1 capsule (200 mg total) by mouth 3 (three) times daily as needed for cough. 30 capsule 2   calcium carbonate (OS-CAL - DOSED IN MG OF ELEMENTAL CALCIUM) 1250 (500 Ca) MG tablet Take by mouth.  cetirizine (ZYRTEC) 10 MG tablet TAKE 1 TABLET(10 MG) BY MOUTH DAILY 90 tablet 1   Cholecalciferol (VITAMIN D) 125 MCG (5000 UT) CAPS Take 5,000 Units by mouth daily.     clopidogrel (PLAVIX) 75 MG tablet Take 1 tablet (75 mg total) by mouth daily. 90 tablet 3   cyclobenzaprine (FLEXERIL) 5 MG tablet Take by mouth.     DULoxetine (CYMBALTA) 60 MG capsule Take 60 mg by mouth daily.     escitalopram (LEXAPRO) 20 MG tablet Take 20 mg by mouth at bedtime.      ezetimibe (ZETIA) 10 MG tablet Take 1 tablet (10 mg total) by mouth daily. No additional refill until completed office visit 90 tablet 3   fluticasone (FLONASE) 50 MCG/ACT nasal spray Place into the nose.     ipratropium-albuterol (DUONEB) 0.5-2.5 (3) MG/3ML SOLN Take 3 mLs by nebulization every 4 (four) hours as needed. DX: J44.9 COPD 360 mL 3   magnesium oxide (MAG-OX) 400 MG tablet Take 400 mg by mouth daily.      Melatonin 10 MG TABS Take 30 mg by mouth at bedtime.     metoprolol succinate (TOPROL-XL) 50 MG 24 hr tablet TAKE 1 TABLET(50 MG) BY MOUTH DAILY WITH OR IMMEDIATELY FOLLOWING A MEAL 90 tablet 0   naratriptan (AMERGE) 2.5 MG tablet TAKE 1 TABLET(2.5 MG) BY MOUTH 1  TIME FOR UP TO 1 DOSE AS NEEDED FOR MIGRAINE. MAY TAKE A SECOND DOSE AFTER 4 HOURS AS NEEDED     nitroGLYCERIN (NITROSTAT) 0.4 MG SL tablet Place 1 tablet (0.4 mg total) under the tongue every 5 (five) minutes as needed for chest pain. 25 tablet 3   nortriptyline (PAMELOR) 25 MG capsule Take 50 mg by mouth at bedtime.     NURTEC 75 MG TBDP Place 75 mg under the tongue daily as needed (migraine).      OXcarbazepine ER (OXTELLAR XR) 600 MG TB24 TAKE 2 TABLETS(1200 MG) BY MOUTH EVERY DAY     pantoprazole (PROTONIX) 40 MG tablet TAKE 1 TABLET(40 MG) BY MOUTH DAILY 30 tablet 10   Potassium 99 MG TABS Take 99 mg by mouth daily.      pregabalin (LYRICA) 200 MG capsule Take 200 mg by mouth 3 (three) times daily.     Probiotic Product (PROBIOTIC DAILY PO) Take 1 tablet by mouth daily.     promethazine (PHENERGAN) 25 MG tablet Take 25 mg by mouth daily.     pyridOXINE (VITAMIN B-6) 100 MG tablet Take 100 mg by mouth daily.      Saw Palmetto 450 MG CAPS Take 900 mg by mouth daily.      Tiotropium Bromide Monohydrate (SPIRIVA RESPIMAT) 2.5 MCG/ACT AERS Inhale 2 puffs into the lungs daily. 4 g 2   zinc sulfate 220 (50 Zn) MG capsule Take by mouth.     No current facility-administered medications on file prior to visit.    Family Hx: The patient's family history includes Cancer in his paternal grandmother; Diabetes in his paternal grandmother and sister; Heart attack (age of onset: 61) in his paternal grandfather; Heart attack (age of onset: 37) in his father; Heart attack (age of onset: 98) in his brother; Heart disease in his father and mother; Hyperlipidemia in his brother and sister; Hypertension in his brother and sister; Supraventricular tachycardia in his brother.  ROS:   Please see the history of present illness.    Review of Systems  Constitutional: Negative.   HENT: Negative.    Respiratory: Negative.  Cardiovascular:  Positive for chest pain.  Gastrointestinal: Negative.    Musculoskeletal: Negative.   Neurological:  Positive for weakness.  Psychiatric/Behavioral: Negative.    All other systems reviewed and are negative.    Labs/Other Tests and Data Reviewed:    Recent Labs: No results found for requested labs within last 365 days.   Recent Lipid Panel Lab Results  Component Value Date/Time   CHOL 140 08/10/2017 09:38 AM   TRIG 65 08/10/2017 09:38 AM   [name] 39 (L) 08/10/2017 09:38 AM   CHOLHDL 3.4 05/01/2016 09:08 AM   LDLCALC 88 08/10/2017 09:38 AM    Wt Readings from Last 3 Encounters:  11/15/23 273 lb (123.8 kg)  07/07/23 273 lb 9.1 oz (124.1 kg)  04/14/23 280 lb 9.6 oz (127.3 kg)     Exam:    Vital Signs: Vital signs may also be detailed in the HPI BP 120/80 (BP Location: Left Arm, Patient Position: Sitting, Cuff Size: Normal)   Pulse 88   Ht 6' (1.829 m)   Wt 273 lb (123.8 kg)   SpO2 94%   BMI 37.03 kg/m  Constitutional:  oriented to person, place, and time. No distress.  HENT:  Head: Grossly normal Eyes:  no discharge. No scleral icterus.  Neck: No JVD, no carotid bruits  Cardiovascular: Regular rate and rhythm, no murmurs appreciated Pulmonary/Chest: Clear to auscultation bilaterally, no wheezes or rails Abdominal: Soft.  no distension.  no tenderness.  Musculoskeletal: Normal range of motion Neurological:  normal muscle tone. Coordination normal. No atrophy Skin: Skin warm and dry Psychiatric: normal affect, pleasant   ASSESSMENT & PLAN:    Problem List Items Addressed This Visit       Cardiology Problems   [name] (coronary artery disease) - Primary   Relevant Orders   EKG 12-Lead (Completed)   Benign essential HTN   Relevant Orders   EKG 12-Lead (Completed)     Other   Chronic obstructive pulmonary disease (HCC)   Relevant Orders   EKG 12-Lead (Completed)   Other Visit Diagnoses     Angina pectoris (HCC)       Relevant Orders   EKG 12-Lead (Completed)   Chest pain of uncertain etiology       Relevant  Orders   EKG 12-Lead (Completed)   Essential hypertension       Relevant Orders   EKG 12-Lead (Completed)       [name] with stable angina Chronic stable angina, periodically requiring sublingual NTG Stress test May 2024 with low risk study no high risk ischemia Nonobstructive disease by cardiac catheterization 2017, moderate distal RCA, moderate left circumflex disease  Migraines/memory loss History of falls, trauma, memory loss, debility Ambulates with a cane, periodic falls  HTN Blood pressure is well controlled on today's visit. No changes made to the medications.  Morbid obesity We have encouraged continued exercise, careful diet management in an effort to lose weight.  Hyperlipidemia Cholesterol mildly elevated in 2023, no changes to the medications were made.  Lipid panel ordered   Signed, Julien Nordmann, MD  [facility] [address], [place], [place] [address]

## 2023-11-15 NOTE — Telephone Encounter (Signed)
Patient is coming in today, 12/9

## 2023-11-15 NOTE — Telephone Encounter (Signed)
Good Morning,  Could you please schedule this patient an overdue 6 month follow up visit? The patient was last seen by Dr. Mariah Milling on 04-05-2023. Thank you so much.

## 2023-11-16 ENCOUNTER — Ambulatory Visit (INDEPENDENT_AMBULATORY_CARE_PROVIDER_SITE_OTHER): Payer: 59 | Admitting: Acute Care

## 2023-11-16 DIAGNOSIS — Z87891 Personal history of nicotine dependence: Secondary | ICD-10-CM

## 2023-11-16 NOTE — Patient Instructions (Signed)

## 2023-11-16 NOTE — Progress Notes (Signed)
  Virtual Visit via Telephone Note  I connected with Cory Espinoza on 11/16/23 at  1:30 PM EST by telephone and verified that I am speaking with the correct person using two identifiers.  Location: Patient: in home Provider: [address] [place], [place], [address]    Shared Decision Making Visit Lung Cancer Screening Program [phone])   Eligibility: Age 52 y.o. Pack Years Smoking History Calculation 51 (# packs/per year x # years smoked) Recent History of coughing up blood  no Unexplained weight loss? no ( >Than 15 pounds within the last 6 months ) Prior History Lung / other cancer no (Diagnosis within the last 5 years already requiring surveillance chest CT Scans). Smoking Status Former Smoker Former Smokers: Years since quit: 11 years  Quit Date: 10-28-12  Visit Components: Discussion included one or more decision making aids. yes Discussion included risk/benefits of screening. yes Discussion included potential follow up diagnostic testing for abnormal scans. yes Discussion included meaning and risk of over diagnosis. yes Discussion included meaning and risk of False Positives. yes Discussion included meaning of total radiation exposure. yes  Counseling Included: Importance of adherence to annual lung cancer LDCT screening. yes Impact of comorbidities on ability to participate in the program. yes Ability and willingness to under diagnostic treatment. yes  Smoking Cessation Counseling: Current Smokers:  Discussed importance of smoking cessation. yes Information about tobacco cessation classes and interventions provided to patient. yes Patient provided with "ticket" for LDCT Scan. yes Symptomatic Patient. no  Counseling NA Diagnosis Code: Tobacco Use Z72.0 Asymptomatic Patient yes  Counseling (Intermediate counseling: > three minutes counseling) [id number] Former Smokers:  Discussed the importance of maintaining cigarette abstinence. yes Diagnosis Code: Personal  History of Nicotine Dependence. W29.562 Information about tobacco cessation classes and interventions provided to patient. Yes Patient provided with "ticket" for LDCT Scan. yes Written Order for Lung Cancer Screening with LDCT placed in Epic. Yes (CT Chest Lung Cancer Screening Low Dose W/O CM) [id number] Z12.2-Screening of respiratory organs Z87.891-Personal history of nicotine dependence   Karl Bales, RN 11/16/23

## 2023-11-17 ENCOUNTER — Ambulatory Visit
Admission: RE | Admit: 2023-11-17 | Discharge: 2023-11-17 | Disposition: A | Discharge status: Home / Self Care | Payer: 59 | Source: Ambulatory Visit | Attending: Family Medicine | Admitting: Family Medicine

## 2023-11-17 DIAGNOSIS — Z122 Encounter for screening for malignant neoplasm of respiratory organs: Secondary | ICD-10-CM | POA: Diagnosis present

## 2023-11-17 DIAGNOSIS — Z87891 Personal history of nicotine dependence: Secondary | ICD-10-CM

## 2023-11-18 LAB — COMPREHENSIVE METABOLIC PANEL
ALT: 52 [IU]/L — ABNORMAL HIGH (ref 0–44)
AST: 26 [IU]/L (ref 0–40)
Albumin: 4.5 g/dL (ref 3.8–4.9)
Alkaline Phosphatase: 184 [IU]/L — ABNORMAL HIGH (ref 44–121)
BUN/Creatinine Ratio: 12 (ref 9–20)
BUN: 12 mg/dL (ref 6–24)
Bilirubin Total: 0.3 mg/dL (ref 0.0–1.2)
CO2: 22 mmol/L (ref 20–29)
Calcium: 9.6 mg/dL (ref 8.7–10.2)
Chloride: 103 mmol/L (ref 96–106)
Creatinine, Ser: 1.02 mg/dL (ref 0.76–1.27)
Globulin, Total: 2.4 g/dL (ref 1.5–4.5)
Glucose: 94 mg/dL (ref 70–99)
Potassium: 4.6 mmol/L (ref 3.5–5.2)
Sodium: 144 mmol/L (ref 134–144)
Total Protein: 6.9 g/dL (ref 6.0–8.5)
eGFR: 89 mL/min/{1.73_m2} (ref >59)

## 2023-11-18 LAB — LIPID PANEL
Chol/HDL Ratio: 3.6 {ratio} (ref 0.0–5.0)
Cholesterol, Total: 124 mg/dL (ref 100–199)
HDL: 34 mg/dL — ABNORMAL LOW (ref >39)
LDL Chol Calc (NIH): 60 mg/dL (ref 0–99)
Triglycerides: 179 mg/dL — ABNORMAL HIGH (ref 0–149)
VLDL Cholesterol Cal: 30 mg/dL (ref 5–40)

## 2023-11-29 ENCOUNTER — Other Ambulatory Visit: Payer: Self-pay | Admitting: Acute Care

## 2023-11-29 DIAGNOSIS — Z87891 Personal history of nicotine dependence: Secondary | ICD-10-CM

## 2023-11-29 DIAGNOSIS — Z122 Encounter for screening for malignant neoplasm of respiratory organs: Secondary | ICD-10-CM

## 2024-01-18 ENCOUNTER — Other Ambulatory Visit: Payer: Self-pay | Admitting: Internal Medicine

## 2024-02-24 ENCOUNTER — Other Ambulatory Visit: Payer: Self-pay | Admitting: Cardiovascular Disease

## 2024-02-24 DIAGNOSIS — I2510 Atherosclerotic heart disease of native coronary artery without angina pectoris: Secondary | ICD-10-CM

## 2024-03-23 ENCOUNTER — Other Ambulatory Visit: Payer: Self-pay | Admitting: Cardiovascular Disease

## 2024-05-30 ENCOUNTER — Other Ambulatory Visit: Payer: Self-pay

## 2024-05-30 DIAGNOSIS — R296 Repeated falls: Secondary | ICD-10-CM

## 2024-05-30 DIAGNOSIS — G43719 Chronic migraine without aura, intractable, without status migrainosus: Secondary | ICD-10-CM

## 2024-05-30 DIAGNOSIS — R29898 Other symptoms and signs involving the musculoskeletal system: Secondary | ICD-10-CM

## 2024-06-05 ENCOUNTER — Ambulatory Visit
Admission: RE | Admit: 2024-06-05 | Discharge: 2024-06-05 | Disposition: A | Discharge status: Home / Self Care | Source: Ambulatory Visit

## 2024-06-05 DIAGNOSIS — R29898 Other symptoms and signs involving the musculoskeletal system: Secondary | ICD-10-CM | POA: Insufficient documentation

## 2024-06-05 DIAGNOSIS — G43719 Chronic migraine without aura, intractable, without status migrainosus: Secondary | ICD-10-CM | POA: Insufficient documentation

## 2024-06-05 DIAGNOSIS — R296 Repeated falls: Secondary | ICD-10-CM | POA: Insufficient documentation

## 2024-09-21 ENCOUNTER — Encounter: Payer: Self-pay | Admitting: Adult Health

## 2024-09-21 ENCOUNTER — Ambulatory Visit: Admitting: Adult Health

## 2024-09-21 ENCOUNTER — Ambulatory Visit: Admitting: *Deleted

## 2024-09-21 VITALS — BP 144/76 | HR 87 | Temp 98.3°F | Ht 72.0 in | Wt 229.0 lb

## 2024-09-21 DIAGNOSIS — G4734 Idiopathic sleep related nonobstructive alveolar hypoventilation: Secondary | ICD-10-CM

## 2024-09-21 DIAGNOSIS — G43819 Other migraine, intractable, without status migrainosus: Secondary | ICD-10-CM

## 2024-09-21 DIAGNOSIS — Z23 Encounter for immunization: Secondary | ICD-10-CM

## 2024-09-21 DIAGNOSIS — J449 Chronic obstructive pulmonary disease, unspecified: Secondary | ICD-10-CM

## 2024-09-21 DIAGNOSIS — Z720 Tobacco use: Secondary | ICD-10-CM

## 2024-09-21 DIAGNOSIS — Z6831 Body mass index (BMI) 31.0-31.9, adult: Secondary | ICD-10-CM

## 2024-09-21 DIAGNOSIS — G4733 Obstructive sleep apnea (adult) (pediatric): Secondary | ICD-10-CM

## 2024-09-21 LAB — PULMONARY FUNCTION TEST
DL/VA % pred: 99 %
DL/VA: 4.4 ml/min/mmHg/L
DLCO cor % pred: 107 %
DLCO cor: 30.16 ml/min/mmHg
DLCO unc % pred: 107 %
DLCO unc: 30.16 ml/min/mmHg
FEF 25-75 Post: 3.94 L/s
FEF 25-75 Pre: 4.05 L/s
FEF2575-%Change-Post: -2 %
FEF2575-%Pred-Post: 120 %
FEF2575-%Pred-Pre: 123 %
FEV1-%Change-Post: -1 %
FEV1-%Pred-Post: 104 %
FEV1-%Pred-Pre: 106 %
FEV1-Post: 3.92 L
FEV1-Pre: 3.98 L
FEV1FVC-%Change-Post: 0 %
FEV1FVC-%Pred-Pre: 106 %
FEV6-%Change-Post: -1 %
FEV6-%Pred-Post: 101 %
FEV6-%Pred-Pre: 102 %
FEV6-Post: 4.71 L
FEV6-Pre: 4.79 L
FEV6FVC-%Change-Post: 0 %
FEV6FVC-%Pred-Post: 103 %
FEV6FVC-%Pred-Pre: 102 %
FVC-%Change-Post: -2 %
FVC-%Pred-Post: 97 %
FVC-%Pred-Pre: 99 %
FVC-Post: 4.72 L
FVC-Pre: 4.83 L
Post FEV1/FVC ratio: 83 %
Post FEV6/FVC ratio: 100 %
Pre FEV1/FVC ratio: 82 %
Pre FEV6/FVC Ratio: 99 %
RV % pred: 99 %
RV: 2.02 L
TLC % pred: 103 %
TLC: 7 L

## 2024-09-21 NOTE — Patient Instructions (Signed)
 Full PFT performed today.

## 2024-09-21 NOTE — Patient Instructions (Addendum)
 Continue with yearly Lung cancer CT chest screening program  Continue on Spiriva  2 puffs daily  Albuterol  As needed   Activity as tolerated  Work on healthy weight loss Use caution with sedating medications Do not drive if sleepy.  Continue on Oxygen  2 l/m At bedtime   Prevnar vaccine today.  Sleep with Head of bed at 30 degree incline. Avoid sleeping on back.  Follow-up with Dr. Isaiah  in 1 year and As needed  [facility] office

## 2024-09-21 NOTE — Progress Notes (Signed)
 Full PFT performed today.
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
## 2024-10-27 ENCOUNTER — Encounter: Payer: Self-pay | Admitting: Cardiovascular Disease

## 2024-11-05 ENCOUNTER — Other Ambulatory Visit: Payer: Self-pay | Admitting: Cardiovascular Disease

## 2024-11-05 DIAGNOSIS — I2510 Atherosclerotic heart disease of native coronary artery without angina pectoris: Secondary | ICD-10-CM

## 2024-11-05 DIAGNOSIS — I10 Essential (primary) hypertension: Secondary | ICD-10-CM

## 2024-11-05 DIAGNOSIS — I25118 Atherosclerotic heart disease of native coronary artery with other forms of angina pectoris: Secondary | ICD-10-CM

## 2024-11-06 ENCOUNTER — Other Ambulatory Visit: Payer: Self-pay | Admitting: Cardiovascular Disease

## 2024-11-06 DIAGNOSIS — I2510 Atherosclerotic heart disease of native coronary artery without angina pectoris: Secondary | ICD-10-CM

## 2024-11-06 DIAGNOSIS — I10 Essential (primary) hypertension: Secondary | ICD-10-CM

## 2024-11-06 DIAGNOSIS — I25118 Atherosclerotic heart disease of native coronary artery with other forms of angina pectoris: Secondary | ICD-10-CM

## 2024-12-25 ENCOUNTER — Other Ambulatory Visit: Payer: Self-pay | Admitting: Cardiovascular Disease

## 2024-12-25 DIAGNOSIS — I2510 Atherosclerotic heart disease of native coronary artery without angina pectoris: Secondary | ICD-10-CM
